# Patient Record
Sex: Male | Born: 1940
Health system: Southern US, Community
[De-identification: ages and names within clinical notes are randomized; demographics above are authoritative.]

## PROBLEM LIST (undated history)

## (undated) DIAGNOSIS — R002 Palpitations: Secondary | ICD-10-CM

## (undated) DIAGNOSIS — C4359 Malignant melanoma of other part of trunk: Secondary | ICD-10-CM

## (undated) DIAGNOSIS — I889 Nonspecific lymphadenitis, unspecified: Secondary | ICD-10-CM

## (undated) DIAGNOSIS — I499 Cardiac arrhythmia, unspecified: Secondary | ICD-10-CM

## (undated) DIAGNOSIS — N400 Enlarged prostate without lower urinary tract symptoms: Secondary | ICD-10-CM

## (undated) DIAGNOSIS — IMO0001 Reserved for inherently not codable concepts without codable children: Secondary | ICD-10-CM

## (undated) DIAGNOSIS — N200 Calculus of kidney: Secondary | ICD-10-CM

## (undated) DIAGNOSIS — K219 Gastro-esophageal reflux disease without esophagitis: Secondary | ICD-10-CM

## (undated) DIAGNOSIS — Z0000 Encounter for general adult medical examination without abnormal findings: Secondary | ICD-10-CM

## (undated) DIAGNOSIS — I4891 Unspecified atrial fibrillation: Secondary | ICD-10-CM

## (undated) DIAGNOSIS — E781 Pure hyperglyceridemia: Secondary | ICD-10-CM

## (undated) DIAGNOSIS — R001 Bradycardia, unspecified: Secondary | ICD-10-CM

## (undated) DIAGNOSIS — I77810 Thoracic aortic ectasia: Secondary | ICD-10-CM

## (undated) DIAGNOSIS — R972 Elevated prostate specific antigen [PSA]: Secondary | ICD-10-CM

## (undated) DIAGNOSIS — Z0389 Encounter for observation for other suspected diseases and conditions ruled out: Secondary | ICD-10-CM

## (undated) DIAGNOSIS — B0229 Other postherpetic nervous system involvement: Secondary | ICD-10-CM

## (undated) DIAGNOSIS — Z87442 Personal history of urinary calculi: Secondary | ICD-10-CM

## (undated) DIAGNOSIS — M1990 Unspecified osteoarthritis, unspecified site: Secondary | ICD-10-CM

## (undated) DIAGNOSIS — H60399 Other infective otitis externa, unspecified ear: Secondary | ICD-10-CM

## (undated) DIAGNOSIS — B029 Zoster without complications: Principal | ICD-10-CM

## (undated) DIAGNOSIS — F419 Anxiety disorder, unspecified: Secondary | ICD-10-CM

## (undated) DIAGNOSIS — G4730 Sleep apnea, unspecified: Secondary | ICD-10-CM

## (undated) DIAGNOSIS — M79645 Pain in left finger(s): Secondary | ICD-10-CM

## (undated) DIAGNOSIS — R0683 Snoring: Secondary | ICD-10-CM

## (undated) HISTORY — DX: Sleep apnea, unspecified: G47.30

## (undated) HISTORY — DX: Palpitations: R00.2

## (undated) HISTORY — PX: MOUTH SURGERY: SHX715

## (undated) HISTORY — DX: Bradycardia, unspecified: R00.1

## (undated) HISTORY — DX: Unspecified atrial fibrillation: I48.91

## (undated) HISTORY — DX: Pure hyperglyceridemia: E78.1

## (undated) HISTORY — DX: Gastro-esophageal reflux disease without esophagitis: K21.9

## (undated) HISTORY — DX: Benign prostatic hyperplasia without lower urinary tract symptoms: N40.0

## (undated) HISTORY — DX: Zoster without complications: B02.9

## (undated) HISTORY — DX: Other infective otitis externa, unspecified ear: H60.399

## (undated) HISTORY — DX: Other postherpetic nervous system involvement: B02.29

## (undated) HISTORY — DX: Snoring: R06.83

## (undated) HISTORY — PX: OTHER SURGICAL HISTORY: SHX169

## (undated) HISTORY — PX: CATARACT EXTRACTION: SUR2

## (undated) HISTORY — DX: Elevated prostate specific antigen (PSA): R97.20

## (undated) HISTORY — DX: Pain in left finger(s): M79.645

## (undated) HISTORY — DX: Malignant melanoma of other part of trunk: C43.59

## (undated) HISTORY — DX: Encounter for general adult medical examination without abnormal findings: Z00.00

## (undated) HISTORY — DX: Encounter for observation for other suspected diseases and conditions ruled out: Z03.89

## (undated) HISTORY — DX: Reserved for inherently not codable concepts without codable children: IMO0001

---
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
## 2002-12-27 DIAGNOSIS — J189 Pneumonia, unspecified organism: Secondary | ICD-10-CM

## 2002-12-27 HISTORY — DX: Pneumonia, unspecified organism: J18.9

## 2003-09-26 LAB — HM COLONOSCOPY: HM Colonoscopy: NORMAL

## 2008-08-29 ENCOUNTER — Ambulatory Visit (HOSPITAL_BASED_OUTPATIENT_CLINIC_OR_DEPARTMENT_OTHER): Admission: RE | Admit: 2008-08-29 | Discharge: 2008-08-29 | Payer: Self-pay | Admitting: *Deleted

## 2008-08-29 ENCOUNTER — Ambulatory Visit: Payer: Self-pay | Admitting: *Deleted

## 2008-08-29 DIAGNOSIS — R030 Elevated blood-pressure reading, without diagnosis of hypertension: Secondary | ICD-10-CM

## 2008-08-29 DIAGNOSIS — M25519 Pain in unspecified shoulder: Secondary | ICD-10-CM | POA: Insufficient documentation

## 2008-08-29 DIAGNOSIS — N400 Enlarged prostate without lower urinary tract symptoms: Secondary | ICD-10-CM

## 2008-08-29 DIAGNOSIS — D1739 Benign lipomatous neoplasm of skin and subcutaneous tissue of other sites: Secondary | ICD-10-CM | POA: Insufficient documentation

## 2008-08-29 DIAGNOSIS — K219 Gastro-esophageal reflux disease without esophagitis: Secondary | ICD-10-CM | POA: Insufficient documentation

## 2008-08-30 ENCOUNTER — Encounter: Admission: RE | Admit: 2008-08-30 | Discharge: 2008-09-18 | Payer: Self-pay | Admitting: *Deleted

## 2008-09-04 ENCOUNTER — Ambulatory Visit (HOSPITAL_BASED_OUTPATIENT_CLINIC_OR_DEPARTMENT_OTHER): Admission: RE | Admit: 2008-09-04 | Discharge: 2008-09-04 | Payer: Self-pay | Admitting: *Deleted

## 2008-09-11 ENCOUNTER — Encounter (INDEPENDENT_AMBULATORY_CARE_PROVIDER_SITE_OTHER): Payer: Self-pay | Admitting: *Deleted

## 2008-09-18 ENCOUNTER — Encounter (INDEPENDENT_AMBULATORY_CARE_PROVIDER_SITE_OTHER): Payer: Self-pay | Admitting: *Deleted

## 2008-10-09 ENCOUNTER — Encounter (INDEPENDENT_AMBULATORY_CARE_PROVIDER_SITE_OTHER): Payer: Self-pay | Admitting: *Deleted

## 2009-01-24 ENCOUNTER — Ambulatory Visit: Payer: Self-pay | Admitting: *Deleted

## 2009-01-24 LAB — CONVERTED CEMR LAB
BUN: 19 mg/dL (ref 6–23)
Chloride: 105 meq/L (ref 96–112)
Creatinine, Ser: 0.9 mg/dL (ref 0.4–1.5)
GFR calc non Af Amer: 90 mL/min

## 2010-01-13 ENCOUNTER — Ambulatory Visit: Payer: Self-pay | Admitting: Internal Medicine

## 2010-01-13 DIAGNOSIS — R972 Elevated prostate specific antigen [PSA]: Secondary | ICD-10-CM | POA: Insufficient documentation

## 2010-01-15 ENCOUNTER — Ambulatory Visit: Payer: Self-pay | Admitting: Internal Medicine

## 2010-01-15 LAB — CONVERTED CEMR LAB
Alkaline Phosphatase: 46 units/L (ref 39–117)
BUN: 15 mg/dL (ref 6–23)
CO2: 25 meq/L (ref 19–32)
CRP, High Sensitivity: 2
Chloride: 104 meq/L (ref 96–112)
Creatinine, Ser: 0.93 mg/dL (ref 0.40–1.50)
Indirect Bilirubin: 0.6 mg/dL (ref 0.0–0.9)
LDL Cholesterol: 71 mg/dL (ref 0–99)
Total Bilirubin: 0.8 mg/dL (ref 0.3–1.2)
Triglycerides: 91 mg/dL (ref <150)

## 2010-01-21 ENCOUNTER — Telehealth: Payer: Self-pay | Admitting: Internal Medicine

## 2010-01-21 ENCOUNTER — Encounter: Payer: Self-pay | Admitting: Internal Medicine

## 2010-01-29 ENCOUNTER — Encounter: Payer: Self-pay | Admitting: Internal Medicine

## 2010-05-28 ENCOUNTER — Encounter: Payer: Self-pay | Admitting: Internal Medicine

## 2010-05-28 LAB — CONVERTED CEMR LAB
AST: 19 units/L (ref 0–37)
Albumin: 4 g/dL (ref 3.5–5.2)
CO2: 23 meq/L (ref 19–32)
Chloride: 108 meq/L (ref 96–112)
HDL: 47 mg/dL (ref >39)
LDL Cholesterol: 74 mg/dL (ref 0–99)
Sodium: 141 meq/L (ref 135–145)
TSH: 1.584 microintl units/mL (ref 0.350–4.500)
Total Bilirubin: 0.7 mg/dL (ref 0.3–1.2)
Total CHOL/HDL Ratio: 2.9

## 2010-06-04 ENCOUNTER — Ambulatory Visit: Payer: Self-pay | Admitting: Internal Medicine

## 2010-06-16 ENCOUNTER — Ambulatory Visit: Payer: Self-pay | Admitting: Internal Medicine

## 2010-06-16 LAB — CONVERTED CEMR LAB: Fecal Occult Bld: NEGATIVE

## 2010-09-23 ENCOUNTER — Encounter: Payer: Self-pay | Admitting: Internal Medicine

## 2010-09-24 ENCOUNTER — Encounter: Payer: Self-pay | Admitting: Internal Medicine

## 2010-09-28 ENCOUNTER — Encounter: Payer: Self-pay | Admitting: Internal Medicine

## 2011-01-26 NOTE — Letter (Signed)
   [place] [address] [place], [place]  [address]  [place] Phone: [phone]      January 21, 2010   [name] [name] [address] [place], [place] [address]  RE:  LAB RESULTS  Dear  [name],  The following is an interpretation of your most recent lab tests.  Please take note of any instructions provided or changes to medications that have resulted from your lab work.  PSA:  high - further testing needed PSA: 4.94  ELECTROLYTES:  Good - no changes needed  KIDNEY FUNCTION TESTS:  Good - no changes needed  LIVER FUNCTION TESTS:  Good - no changes needed  LIPID PANEL:  Good - no changes needed Triglyceride: 91   Cholesterol: 141   LDL: 71   HDL: 52   Chol/HDL%:  2.7 Ratio  THYROID STUDIES:  Thyroid studies normal TSH: 1.566          Sincerely Yours,    Dr. Thomos Lemons

## 2011-01-26 NOTE — Assessment & Plan Note (Signed)
Summary: 6 month follow up/mhf   Vital Signs:  Patient profile:   70 year old male Height:      70.5 inches Weight:      225.75 pounds BMI:     32.05 O2 Sat:      97 % on Room air Temp:     97.6 degrees F oral Pulse rate:   52 / minute Pulse rhythm:   regular Resp:     20 per minute BP sitting:   100 / 70  (left arm) Cuff size:   large  Vitals Entered By: Glendell Docker CMA (June 04, 2010 8:46 AM)  O2 Flow:  Room air CC: Rm 3- 6 Month Follow up  Is Patient Diabetic? No   Primary Care Provider:  DThomos Lemons DO  CC:  Rm 3- 6 Month Follow up .  History of Present Illness: 70 y/o white male for f/u Int hx:  pt seen by Dr. Vernie Ammons (urologist)  we reviewed labs cholesterol levels are reasonable CRP elevated.  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   Mouth:  poor dentition.   Lungs:  normal respiratory effort and normal breath sounds.   Heart:  normal rate, regular rhythm, and no gallop.   Abdomen:  soft, non-tender, and normal bowel sounds.   Extremities:  No lower extremity edema    Impression & Recommendations:  Problem # 1:  ELEVATED PROSTATE  SPECIFIC ANTIGEN (ICD-790.93) previous DRE with urologist is normal.  some subtle elevation of lateral aspect of left lobe noted by his urologist.  monitored by Dr. Vernie Ammons.  Q 3 - 6 month PSA testing.  If upward trending of PSA - prostate biopsy planned.  Problem # 2:  HEALTH MAINTENANCE EXAM (ICD-V70.0)  Pt counseled on diet and exercise.  CRP elevated.  this may be attributable to chronic dental caries.  F/U with dentist / oral surgeon  Colonoscopy: Normal (09/26/2003) Td Booster: Tdap (06/04/2010)   Flu Vax: Historical (09/15/2009)   Pneumovax: Historical (04/09/2003) Chol: 135 (05/28/2010)   HDL: 47 (05/28/2010)   LDL: 74 (05/28/2010)   TG: 71 (05/28/2010) TSH: 1.584 (05/28/2010)   PSA: 4.94 (01/15/2010)  Complete Medication List: 1)  Bayer Childrens Aspirin 81 Mg Chew (Aspirin) .... Take 1 tablet by mouth once a day 2)  Mens Multivitamin Plus Tabs (Multiple vitamins-minerals) .... Take 1 tablet by mouth once a day 3)  Zostavax [address] Unt/0.10ml Solr (Zoster vaccine live) .... Administer vaccine x 1  Other Orders: Tdap => 43yrs IM (60454) Admin 1st Vaccine (09811)  Patient Instructions: 1)  Please schedule a follow-up appointment in 1 year. 2)  Follow up with your dentist 3)  [email or web address]  Current Allergies (reviewed today): No known allergies       Immunizations Administered:  Tetanus Vaccine:    Vaccine Type: Tdap    Site: left deltoid    Mfr: GlaxoSmithKline    Dose: 0.5 ml    Route: IM    Given by: Glendell Docker CMA    Exp. Date: 03/19/2012    Lot #: [id number]    VIS given: 11/14/07 version given June 04, 2010.

## 2011-01-26 NOTE — Letter (Signed)
Summary: [facility]: Immunoassay Fecal Occult Blood (iFOB) Order Psychologist, counselling at [facility]  [address]   [place], [place] [address]   Phone: [phone]  Fax: [phone]      [facility]: Immunoassay Fecal Occult Blood (iFOB) Order Form   June 04, 2010 MRN: [id number]   [name] [name] [birth year]   Physician Name:Dr Thomos Lemons  Diagnosis Code: V70.0      Glendell Docker CMA

## 2011-01-26 NOTE — Letter (Signed)
Summary: [facility]  [facility]   Imported By: Lanelle Bal 10/08/2010 12:26:18  _____________________________________________________________________  External Attachment:    Type:   Image     Comment:   External Document

## 2011-01-26 NOTE — Letter (Signed)
   [place] [facility] [address] [place], [place]  [address]  [place] Phone: [phone]      June 16, 2010   KVION SHAPLEY [address] [place], [place] [address]  RE:  LAB RESULTS  Dear  Mr. Barrett,  The following is an interpretation of your most recent lab tests.  Please take note of any instructions provided or changes to medications that have resulted from your lab work.  Hemoccult Test for blood in stool:  negative         Sincerely Yours,    Dr. Thomos Lemons

## 2011-01-26 NOTE — Miscellaneous (Signed)
Summary: Flu Shot  Clinical Lists Changes  Observations: Added new observation of FLU VAX: Historical (09/23/2010 17:17)        Immunization History:  Influenza Immunization History:    Influenza:  historical (09/23/2010)

## 2011-01-26 NOTE — Miscellaneous (Signed)
Summary: Lab Orders  Clinical Lists Changes  Orders: Added new Test order of T-Basic Metabolic Panel [phone]) - Signed Added new Test order of T-Hepatic Function [phone]) - Signed Added new Test order of T-Lipid Profile [phone]) - Signed Added new Test order of T-TSH ([id number]) - Signed Added new Test order of TLB-CRP-High Sensitivity (C-Reactive Protein) (86140-FCRP) - Signed

## 2011-01-26 NOTE — Consult Note (Signed)
Summary: [facility]  [facility]   Imported By: Lanelle Bal 02/05/2010 12:28:19  _____________________________________________________________________  External Attachment:    Type:   Image     Comment:   External Document

## 2011-01-26 NOTE — Assessment & Plan Note (Signed)
Summary: NEW TO EST/[facility]   Vital Signs:  Patient profile:   70 year old male Height:      70.5 inches Weight:      235 pounds BMI:     33.36 O2 Sat:      98 % on Room air Temp:     98.0 degrees F oral Pulse rate:   54 / minute Pulse rhythm:   regular Resp:     22 per minute BP sitting:   122 / 80  (right arm) Cuff size:   large  Vitals Entered By: Glendell Docker CMA (January 13, 2010 9:45 AM)  O2 Flow:  Room air  Primary Care Provider:  D. Thomos Lemons DO  CC:  New Patient .  History of Present Illness: New Patient to establish and for routine CPX.  70 y/o white male to establish.  He has hx of BPH and elevated PSA.  he reports prev prostate biopsy by Dr. Vernie Ammons.  he has mild obstructive symptoms    Preventive Screening-Counseling & Management  Alcohol-Tobacco     Alcohol drinks/day: 1      Alcohol type: spirits     Packs/Day: 0.5     Year Started: 1963     Year Quit: 1992     Pack years: 2 years      Cigars/week: 1 per day      Pipe use/week: mid 11's -early 90's  Caffeine-Diet-Exercise     Caffeine use/day: 2 cups coffee daly     Does Patient Exercise: yes     Times/week: 3  Allergies (verified): No Known Drug Allergies  Past History:  Past Medical History: GERD - controlled with diet / behavioral changes Benign prostatic hypertrophy White coat HTN    Past Surgical History: Denies surgical history    Family History: Family History of Alcoholism/Addiction    Social History: 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         no depression no memory loss  Physical Exam  General:  alert and overweight-appearing.   Eyes:  pupils equal, pupils round, and pupils  reactive to light.   Mouth:  pharynx pink and moist.   Neck:  supple and no carotid bruits.   Lungs:  normal respiratory effort and normal breath sounds.   Heart:  normal rate, regular rhythm, no murmur, and no gallop.   Abdomen:  soft, non-tender, no masses, no hepatomegaly, and no splenomegaly.   Extremities:  No lower extremity edema  Neurologic:  cranial nerves II-XII intact.   Psych:  normally interactive, good eye contact, not anxious appearing, and not depressed appearing.     Impression & Recommendations:  Problem # 1:  HEALTH MAINTENANCE EXAM (ICD-V70.0) Reviewed adult health maintenance protocols.  Colonoscopy: Normal (09/26/2003) Flu Vax: Historical (09/15/2009)   Pneumovax: Historical (04/09/2003) PSA: 3.96 (01/24/2009)  Problem # 2:  ELEVATED PROSTATE SPECIFIC ANTIGEN (ICD-790.93) Pt previously followed by Dr. Vernie Ammons.  Pt requests repeat PSA.  Follow up with urologist.  Complete Medication List: 1)  Bayer Childrens Aspirin 81 Mg Chew (Aspirin) .... Take 1 tablet by mouth once a day 2)  Mens Multivitamin Plus Tabs (Multiple vitamins-minerals) .... Take 1 tablet by mouth once a day 3)  Zostavax  19400 Unt/0.62ml Solr (Zoster vaccine live) .... Administer vaccine x 1  Patient Instructions: 1)  Please schedule a follow-up appointment in 6 months. 2)  BMP prior to visit, ICD-9:  796.2 3)  Hepatic Panel prior to visit, ICD-9: 796.2 4)  Lipid Panel prior to visit, ICD-9: 796.2 5)  TSH prior to visit, ICD-9: 796.2 6)  High sensitivity CRP :  796.2 7)  PSA prior to visit, ICD-9: 600.00 8)  Please return for lab work within one week 9)  Avoid caffeinated beverages Prescriptions: ZOSTAVAX 91478 UNT/0.65ML SOLR (ZOSTER VACCINE LIVE) administer vaccine x 1  #1 x 0   Entered and Authorized by:   D. Thomos Lemons DO   Signed by:   D. Thomos Lemons DO on 01/13/2010   Method used:   Print then Give to Patient   RxID:   [id number]   Current Allergies (reviewed today): No  known allergies    Immunization History:  Influenza Immunization History:    Influenza:  historical (09/15/2009)  Pneumovax Immunization History:    Pneumovax:  historical (04/09/2003)

## 2011-01-26 NOTE — Progress Notes (Signed)
  Phone Note Outgoing Call   Summary of Call: call pt - PSA elevated 4.94.  I suggest he follow up with urologist Initial call taken by: D. Thomos Lemons DO,  January 21, 2010 6:23 PM  Follow-up for Phone Call        Pt notified as directed  appt  Dr Warner Mccreedy   @ [facility]   Feb 3  Follow-up by: Darral Dash,  January 22, 2010 11:30 AM

## 2011-01-26 NOTE — Miscellaneous (Signed)
Summary: Flu/[facility]  Flu/[facility]   Imported By: Lanelle Bal 10/05/2010 11:49:20  _____________________________________________________________________  External Attachment:    Type:   Image     Comment:   External Document

## 2011-03-23 ENCOUNTER — Emergency Department (HOSPITAL_BASED_OUTPATIENT_CLINIC_OR_DEPARTMENT_OTHER)
Admission: EM | Admit: 2011-03-23 | Discharge: 2011-03-24 | Disposition: A | Discharge status: Home / Self Care | Payer: Medicare Other | Attending: Emergency Medicine | Admitting: Emergency Medicine

## 2011-03-23 ENCOUNTER — Emergency Department (INDEPENDENT_AMBULATORY_CARE_PROVIDER_SITE_OTHER): Payer: Medicare Other

## 2011-03-23 DIAGNOSIS — J40 Bronchitis, not specified as acute or chronic: Secondary | ICD-10-CM | POA: Insufficient documentation

## 2011-03-23 DIAGNOSIS — R0602 Shortness of breath: Secondary | ICD-10-CM | POA: Insufficient documentation

## 2011-03-23 DIAGNOSIS — R0989 Other specified symptoms and signs involving the circulatory and respiratory systems: Secondary | ICD-10-CM

## 2011-03-23 DIAGNOSIS — L908 Other atrophic disorders of skin: Secondary | ICD-10-CM | POA: Insufficient documentation

## 2011-03-23 DIAGNOSIS — R05 Cough: Secondary | ICD-10-CM

## 2011-06-03 ENCOUNTER — Ambulatory Visit: Payer: Self-pay | Admitting: Internal Medicine

## 2011-06-18 ENCOUNTER — Encounter: Payer: PRIVATE HEALTH INSURANCE | Admitting: Internal Medicine

## 2011-06-22 ENCOUNTER — Encounter: Payer: PRIVATE HEALTH INSURANCE | Admitting: Internal Medicine

## 2011-07-15 ENCOUNTER — Encounter: Payer: Self-pay | Admitting: Internal Medicine

## 2011-07-20 ENCOUNTER — Encounter: Payer: PRIVATE HEALTH INSURANCE | Admitting: Internal Medicine

## 2011-07-20 ENCOUNTER — Other Ambulatory Visit: Payer: Self-pay | Admitting: Internal Medicine

## 2011-07-20 DIAGNOSIS — R030 Elevated blood-pressure reading, without diagnosis of hypertension: Secondary | ICD-10-CM

## 2011-07-21 LAB — BASIC METABOLIC PANEL
BUN: 22 mg/dL (ref 6–23)
CO2: 24 mEq/L (ref 19–32)
Calcium: 8.8 mg/dL (ref 8.4–10.5)
Creat: 0.94 mg/dL (ref 0.50–1.35)

## 2011-07-21 LAB — HEPATIC FUNCTION PANEL
Alkaline Phosphatase: 42 U/L (ref 39–117)
Bilirubin, Direct: 0.1 mg/dL (ref 0.0–0.3)
Indirect Bilirubin: 0.6 mg/dL (ref 0.0–0.9)
Total Protein: 6.8 g/dL (ref 6.0–8.3)

## 2011-07-21 LAB — LIPID PANEL: HDL: 46 mg/dL (ref >39)

## 2011-07-21 LAB — TSH: TSH: 2.104 u[IU]/mL (ref 0.350–4.500)

## 2011-07-30 ENCOUNTER — Encounter: Payer: Self-pay | Admitting: Internal Medicine

## 2011-07-30 ENCOUNTER — Ambulatory Visit (INDEPENDENT_AMBULATORY_CARE_PROVIDER_SITE_OTHER): Payer: Medicare Other | Admitting: Internal Medicine

## 2011-07-30 VITALS — BP 126/80 | HR 51 | Temp 97.5°F | Resp 20 | Ht 70.5 in | Wt 230.0 lb

## 2011-07-30 DIAGNOSIS — R0600 Dyspnea, unspecified: Secondary | ICD-10-CM

## 2011-07-30 DIAGNOSIS — R0683 Snoring: Secondary | ICD-10-CM

## 2011-07-30 DIAGNOSIS — G4730 Sleep apnea, unspecified: Secondary | ICD-10-CM

## 2011-07-30 DIAGNOSIS — Z9989 Dependence on other enabling machines and devices: Secondary | ICD-10-CM | POA: Insufficient documentation

## 2011-07-30 DIAGNOSIS — R0989 Other specified symptoms and signs involving the circulatory and respiratory systems: Secondary | ICD-10-CM

## 2011-07-30 HISTORY — DX: Snoring: R06.83

## 2011-07-30 HISTORY — DX: Sleep apnea, unspecified: G47.30

## 2011-07-30 MED ORDER — ALBUTEROL 90 MCG/ACT IN AERS: 2.0000 | INHALATION_SPRAY | Freq: Four times a day (QID) | RESPIRATORY_TRACT | Status: DC | PRN

## 2011-07-30 NOTE — Assessment & Plan Note (Signed)
EKG obtained demonstrates SB 53 with nl intervals and axis. Suspect RAD. Provide with albuterol mdi prn.

## 2011-07-30 NOTE — Progress Notes (Signed)
  Subjective:    Patient ID: [name] [name], male    DOB: 1941/10/21, 70 y.o.   MRN: [id number]  HPI Pt presents to clinic for followup of multiple medical problems. Notes intermittent rare wheezing and mild dyspnea. Was told in past might have been allergic etiology. Previously responded to albuterol but has no mdi currently. H/o elevated psa and possible bph followed currently by urology. Reports stable psa. Past h/o elevated bp and bp reviewed nl today. No other complaints.   Reviewed pmh, medications and allergies.    Review of Systems  Respiratory: Negative for cough, shortness of breath and wheezing.   Cardiovascular: Negative for chest pain and palpitations.  Gastrointestinal: Negative for abdominal pain and blood in stool.       Objective:   Physical Exam  Nursing note and vitals reviewed. Constitutional: He appears well-developed and well-nourished. No distress.  HENT:  Head: Normocephalic and atraumatic.  Right Ear: Tympanic membrane, external ear and ear canal normal.  Left Ear: Tympanic membrane, external ear and ear canal normal.  Nose: Nose normal.  Mouth/Throat: Oropharynx is clear and moist. No oropharyngeal exudate.  Eyes: Conjunctivae and EOM are normal. Pupils are equal, round, and reactive to light. Right eye exhibits no discharge. Left eye exhibits no discharge. No scleral icterus.  Neck: Normal range of motion. Neck supple. No JVD present. Carotid bruit is not present. No thyromegaly present.  Cardiovascular: Normal rate, regular rhythm, normal heart sounds and intact distal pulses.  Exam reveals no gallop and no friction rub.   No murmur heard. Pulmonary/Chest: Effort normal and breath sounds normal. No respiratory distress. He has no wheezes. He has no rales.  Abdominal: Soft. Bowel sounds are normal. He exhibits no distension and no mass. There is no hepatosplenomegaly. There is no tenderness. There is no rebound and no guarding.  Lymphadenopathy:    He has no  cervical adenopathy.  Neurological: He is alert.  Skin: Skin is warm and dry. He is not diaphoretic.  Psychiatric: He has a normal mood and affect.          Assessment & Plan:

## 2012-03-27 DIAGNOSIS — R972 Elevated prostate specific antigen [PSA]: Secondary | ICD-10-CM | POA: Diagnosis not present

## 2012-03-28 DIAGNOSIS — N401 Enlarged prostate with lower urinary tract symptoms: Secondary | ICD-10-CM | POA: Diagnosis not present

## 2012-03-28 DIAGNOSIS — R972 Elevated prostate specific antigen [PSA]: Secondary | ICD-10-CM | POA: Diagnosis not present

## 2012-06-05 ENCOUNTER — Ambulatory Visit (INDEPENDENT_AMBULATORY_CARE_PROVIDER_SITE_OTHER): Payer: Medicare Other | Admitting: Internal Medicine

## 2012-06-05 ENCOUNTER — Ambulatory Visit (HOSPITAL_BASED_OUTPATIENT_CLINIC_OR_DEPARTMENT_OTHER)
Admission: RE | Admit: 2012-06-05 | Discharge: 2012-06-05 | Disposition: A | Discharge status: Home / Self Care | Payer: Medicare Other | Source: Ambulatory Visit | Attending: Internal Medicine | Admitting: Internal Medicine

## 2012-06-05 ENCOUNTER — Encounter: Payer: Self-pay | Admitting: Internal Medicine

## 2012-06-05 VITALS — BP 108/68 | HR 68 | Temp 97.7°F | Resp 20 | Ht 70.5 in | Wt 220.0 lb

## 2012-06-05 DIAGNOSIS — R0789 Other chest pain: Secondary | ICD-10-CM

## 2012-06-05 DIAGNOSIS — R071 Chest pain on breathing: Secondary | ICD-10-CM

## 2012-06-05 DIAGNOSIS — R079 Chest pain, unspecified: Secondary | ICD-10-CM | POA: Insufficient documentation

## 2012-06-05 MED ORDER — DICLOFENAC SODIUM 75 MG PO TBEC: DELAYED_RELEASE_TABLET | ORAL | Status: DC

## 2012-06-11 DIAGNOSIS — R0789 Other chest pain: Secondary | ICD-10-CM | POA: Insufficient documentation

## 2012-06-11 NOTE — Progress Notes (Signed)
  Subjective:    Patient ID: [name] [name], male    DOB: 1941-02-10, 71 y.o.   MRN: [id number]  HPI Pt presents to clinic for evaluation of shoulder pain. Notes one week h/o right shoulder pain worse with movement. No trigger/injury. Location right anterior CW and scapular. No alleviating or exacerbating factors.   Past Medical History  Diagnosis Date  . GERD (gastroesophageal reflux disease)     controlled w/ diet and behavioral changes  . BPH (benign prostatic hypertrophy)   . Elevated PSA   . White coat hypertension    No past surgical history on file.  reports that he has quit smoking. He has never used smokeless tobacco. He reports that he drinks alcohol. He reports that he does not use illicit drugs. family history includes Alcohol abuse in his other; Cancer in his sister; Emphysema in his father; and Liver disease in his mother. No Known Allergies   Review of Systems see hpi     Objective:   Physical Exam  Nursing note and vitals reviewed. Constitutional: He appears well-developed and well-nourished. No distress.  Musculoskeletal:       FROM right shoulder. No crepitus. NT. No bony abn. Ant CW +tenderness without bony abn  Neurological: He is alert.  Skin: Skin is warm and dry. He is not diaphoretic.  Psychiatric: He has a normal mood and affect.          Assessment & Plan:

## 2012-06-11 NOTE — Assessment & Plan Note (Signed)
Obtain cxr. attept voltaren with food and no other nsaids. Followup if no improvement or worsening.

## 2012-07-27 ENCOUNTER — Encounter: Payer: Medicare Other | Admitting: Internal Medicine

## 2012-08-03 ENCOUNTER — Ambulatory Visit (INDEPENDENT_AMBULATORY_CARE_PROVIDER_SITE_OTHER): Payer: Medicare Other | Admitting: Internal Medicine

## 2012-08-03 ENCOUNTER — Encounter: Payer: Self-pay | Admitting: Internal Medicine

## 2012-08-03 VITALS — BP 118/82 | HR 49 | Temp 98.0°F | Resp 16 | Ht 70.0 in | Wt 216.2 lb

## 2012-08-03 DIAGNOSIS — M25519 Pain in unspecified shoulder: Secondary | ICD-10-CM

## 2012-08-03 DIAGNOSIS — H01002 Unspecified blepharitis right lower eyelid: Secondary | ICD-10-CM

## 2012-08-03 DIAGNOSIS — H01009 Unspecified blepharitis unspecified eye, unspecified eyelid: Secondary | ICD-10-CM | POA: Diagnosis not present

## 2012-08-03 DIAGNOSIS — Z79899 Other long term (current) drug therapy: Secondary | ICD-10-CM

## 2012-08-03 LAB — CBC WITH DIFFERENTIAL/PLATELET
Basophils Relative: 1 % (ref 0–1)
Eosinophils Absolute: 0.2 10*3/uL (ref 0.0–0.7)
Eosinophils Relative: 2 % (ref 0–5)
HCT: 41.5 % (ref 39.0–52.0)
Hemoglobin: 14.1 g/dL (ref 13.0–17.0)
MCH: 29.5 pg (ref 26.0–34.0)
MCHC: 34 g/dL (ref 30.0–36.0)
MCV: 86.8 fL (ref 78.0–100.0)
Monocytes Absolute: 0.6 10*3/uL (ref 0.1–1.0)
Monocytes Relative: 8 % (ref 3–12)
Neutrophils Relative %: 62 % (ref 43–77)

## 2012-08-03 LAB — BASIC METABOLIC PANEL
BUN: 18 mg/dL (ref 6–23)
CO2: 27 mEq/L (ref 19–32)
Calcium: 8.9 mg/dL (ref 8.4–10.5)
Glucose, Bld: 82 mg/dL (ref 70–99)

## 2012-08-03 LAB — HEPATIC FUNCTION PANEL
Albumin: 3.9 g/dL (ref 3.5–5.2)
Total Bilirubin: 0.6 mg/dL (ref 0.3–1.2)
Total Protein: 6.7 g/dL (ref 6.0–8.3)

## 2012-08-03 MED ORDER — BACITRACIN-POLYMYXIN B 500-10000 UNIT/GM OP OINT: TOPICAL_OINTMENT | Freq: Two times a day (BID) | OPHTHALMIC | Status: AC

## 2012-08-03 NOTE — Progress Notes (Signed)
  Subjective:    Patient ID: [name] [name], male    DOB: 02-09-1941, 71 y.o.   MRN: [id number]  HPI Pt presents to clinic for followup of multiple medical problems. Notes three day h/o right lower eyelid swelling without injury or change in vision. Notes improvement. Suffers from chronic left shoulder pain with decreased abduction. No improvement with nsaids.   Past Medical History  Diagnosis Date  . GERD (gastroesophageal reflux disease)     controlled w/ diet and behavioral changes  . BPH (benign prostatic hypertrophy)   . Elevated PSA   . White coat hypertension    No past surgical history on file.  reports that he has quit smoking. He has never used smokeless tobacco. He reports that he drinks alcohol. He reports that he does not use illicit drugs. family history includes Alcohol abuse in his other; Cancer in his sister; Emphysema in his father; and Liver disease in his mother. No Known Allergies    Review of Systems see hpi     Objective:   Physical Exam  Physical Exam  Nursing note and vitals reviewed. Constitutional: Appears well-developed and well-nourished. No distress.  HENT: right lower eyelid mildly swollen. NT. No drainage. EOM intact. Head: Normocephalic and atraumatic.  Right Ear: External ear normal.  Left Ear: External ear normal.  Eyes: Conjunctivae are normal. No scleral icterus.  Neck: Neck supple. Carotid bruit is not present.  Cardiovascular: Normal rate, regular rhythm and normal heart sounds.  Exam reveals no gallop and no friction rub.   No murmur heard. Pulmonary/Chest: Effort normal and breath sounds normal. No respiratory distress. He has no wheezes. no rales.  Lymphadenopathy:    He has no cervical adenopathy.  Neurological:Alert.  Skin: Skin is warm and dry. Not diaphoretic.  Psychiatric: Has a normal mood and affect.        Assessment & Plan:

## 2012-08-03 NOTE — Assessment & Plan Note (Signed)
Attempt abx opthalmic ointment. Followup if no improvement or worsening.

## 2012-08-03 NOTE — Assessment & Plan Note (Signed)
Orthopedic referral.

## 2012-08-04 DIAGNOSIS — M19019 Primary osteoarthritis, unspecified shoulder: Secondary | ICD-10-CM | POA: Diagnosis not present

## 2012-08-07 DIAGNOSIS — M19019 Primary osteoarthritis, unspecified shoulder: Secondary | ICD-10-CM | POA: Diagnosis not present

## 2012-10-27 DIAGNOSIS — Z23 Encounter for immunization: Secondary | ICD-10-CM | POA: Diagnosis not present

## 2012-10-31 DIAGNOSIS — L821 Other seborrheic keratosis: Secondary | ICD-10-CM | POA: Diagnosis not present

## 2012-10-31 DIAGNOSIS — L719 Rosacea, unspecified: Secondary | ICD-10-CM | POA: Diagnosis not present

## 2012-10-31 DIAGNOSIS — C4359 Malignant melanoma of other part of trunk: Secondary | ICD-10-CM | POA: Diagnosis not present

## 2012-11-09 DIAGNOSIS — D485 Neoplasm of uncertain behavior of skin: Secondary | ICD-10-CM | POA: Diagnosis not present

## 2012-11-09 DIAGNOSIS — C4359 Malignant melanoma of other part of trunk: Secondary | ICD-10-CM | POA: Diagnosis not present

## 2013-03-05 DIAGNOSIS — L909 Atrophic disorder of skin, unspecified: Secondary | ICD-10-CM | POA: Diagnosis not present

## 2013-03-05 DIAGNOSIS — L821 Other seborrheic keratosis: Secondary | ICD-10-CM | POA: Diagnosis not present

## 2013-03-05 DIAGNOSIS — L570 Actinic keratosis: Secondary | ICD-10-CM | POA: Diagnosis not present

## 2013-03-05 DIAGNOSIS — D239 Other benign neoplasm of skin, unspecified: Secondary | ICD-10-CM | POA: Diagnosis not present

## 2013-03-05 DIAGNOSIS — Z85820 Personal history of malignant melanoma of skin: Secondary | ICD-10-CM | POA: Diagnosis not present

## 2013-04-05 DIAGNOSIS — N138 Other obstructive and reflux uropathy: Secondary | ICD-10-CM | POA: Diagnosis not present

## 2013-04-05 DIAGNOSIS — R972 Elevated prostate specific antigen [PSA]: Secondary | ICD-10-CM | POA: Diagnosis not present

## 2013-04-05 DIAGNOSIS — N401 Enlarged prostate with lower urinary tract symptoms: Secondary | ICD-10-CM | POA: Diagnosis not present

## 2013-04-17 DIAGNOSIS — R972 Elevated prostate specific antigen [PSA]: Secondary | ICD-10-CM | POA: Diagnosis not present

## 2013-04-17 DIAGNOSIS — N401 Enlarged prostate with lower urinary tract symptoms: Secondary | ICD-10-CM | POA: Diagnosis not present

## 2013-06-04 ENCOUNTER — Encounter: Payer: Self-pay | Admitting: Cardiovascular Disease

## 2013-07-05 DIAGNOSIS — L820 Inflamed seborrheic keratosis: Secondary | ICD-10-CM | POA: Diagnosis not present

## 2013-07-05 DIAGNOSIS — Z85820 Personal history of malignant melanoma of skin: Secondary | ICD-10-CM | POA: Diagnosis not present

## 2013-07-05 DIAGNOSIS — D485 Neoplasm of uncertain behavior of skin: Secondary | ICD-10-CM | POA: Diagnosis not present

## 2013-07-05 DIAGNOSIS — D1801 Hemangioma of skin and subcutaneous tissue: Secondary | ICD-10-CM | POA: Diagnosis not present

## 2013-07-05 DIAGNOSIS — D2370 Other benign neoplasm of skin of unspecified lower limb, including hip: Secondary | ICD-10-CM | POA: Diagnosis not present

## 2013-07-05 DIAGNOSIS — D1739 Benign lipomatous neoplasm of skin and subcutaneous tissue of other sites: Secondary | ICD-10-CM | POA: Diagnosis not present

## 2013-07-09 ENCOUNTER — Encounter: Payer: Self-pay | Admitting: Family Medicine

## 2013-07-09 ENCOUNTER — Ambulatory Visit (INDEPENDENT_AMBULATORY_CARE_PROVIDER_SITE_OTHER): Payer: Medicare Other | Admitting: Family Medicine

## 2013-07-09 VITALS — BP 132/92 | HR 68 | Temp 98.6°F | Ht 70.5 in | Wt 220.0 lb

## 2013-07-09 DIAGNOSIS — B0229 Other postherpetic nervous system involvement: Secondary | ICD-10-CM | POA: Insufficient documentation

## 2013-07-09 DIAGNOSIS — B029 Zoster without complications: Secondary | ICD-10-CM

## 2013-07-09 DIAGNOSIS — R030 Elevated blood-pressure reading, without diagnosis of hypertension: Secondary | ICD-10-CM | POA: Diagnosis not present

## 2013-07-09 HISTORY — DX: Zoster without complications: B02.9

## 2013-07-09 MED ORDER — ACYCLOVIR 400 MG PO TABS: 400.0000 mg | ORAL_TABLET | ORAL | Status: DC

## 2013-07-09 MED ORDER — HYDROXYZINE HCL 25 MG PO TABS: 25.0000 mg | ORAL_TABLET | Freq: Two times a day (BID) | ORAL | Status: DC | PRN

## 2013-07-09 NOTE — Assessment & Plan Note (Signed)
Patient reports having Zostavax shot a couple of years ago. Given rx  For Acyclovir and encouraged to cleanse area frequently with Jeanann Lewandowsky Astringent then may use Sarna anti itch lotion, given rx for Hydroxyzine prn for itching, has Vicodin at home he can use prn for pain

## 2013-07-09 NOTE — Patient Instructions (Addendum)
Try Witch Hazel Astringent and/or Sarna anti itch lotion   Shingles Shingles (herpes zoster) is an infection that is caused by the same virus that causes chickenpox (varicella). The infection causes a painful skin rash and fluid-filled blisters, which eventually break open, crust over, and heal. It may occur in any area of the body, but it usually affects only one side of the body or face. The pain of shingles usually lasts about 1 month. However, some people with shingles may develop long-term (chronic) pain in the affected area of the body. Shingles often occurs many years after the person had chickenpox. It is more common:  In people older than 50 years.  In people with weakened immune systems, such as those with HIV, AIDS, or cancer.  In people taking medicines that weaken the immune system, such as transplant medicines.  In people under great stress. CAUSES  Shingles is caused by the varicella zoster virus (VZV), which also causes chickenpox. After a person is infected with the virus, it can remain in the person's body for years in an inactive state (dormant). To cause shingles, the virus reactivates and breaks out as an infection in a nerve root. The virus can be spread from person to person (contagious) through contact with open blisters of the shingles rash. It will only spread to people who have not had chickenpox. When these people are exposed to the virus, they may develop chickenpox. They will not develop shingles. Once the blisters scab over, the person is no longer contagious and cannot spread the virus to others. SYMPTOMS  Shingles shows up in stages. The initial symptoms may be pain, itching, and tingling in an area of the skin. This pain is usually described as burning, stabbing, or throbbing.In a few days or weeks, a painful red rash will appear in the area where the pain, itching, and tingling were felt. The rash is usually on one side of the body in a band or belt-like pattern.  Then, the rash usually turns into fluid-filled blisters. They will scab over and dry up in approximately 2 3 weeks. Flu-like symptoms may also occur with the initial symptoms, the rash, or the blisters. These may include:  Fever.  Chills.  Headache.  Upset stomach. DIAGNOSIS  Your caregiver will perform a skin exam to diagnose shingles. Skin scrapings or fluid samples may also be taken from the blisters. This sample will be examined under a microscope or sent to a lab for further testing. TREATMENT  There is no specific cure for shingles. Your caregiver will likely prescribe medicines to help you manage the pain, recover faster, and avoid long-term problems. This may include antiviral drugs, anti-inflammatory drugs, and pain medicines. HOME CARE INSTRUCTIONS   Take a cool bath or apply cool compresses to the area of the rash or blisters as directed. This may help with the pain and itching.   Only take over-the-counter or prescription medicines as directed by your caregiver.   Rest as directed by your caregiver.  Keep your rash and blisters clean with mild soap and cool water or as directed by your caregiver.  Do not pick your blisters or scratch your rash. Apply an anti-itch cream or numbing creams to the affected area as directed by your caregiver.  Keep your shingles rash covered with a loose bandage (dressing).  Avoid skin contact with:  Babies.   Pregnant women.   Children with eczema.   Elderly people with transplants.   People with chronic illnesses, such as  leukemia or AIDS.   Wear loose-fitting clothing to help ease the pain of material rubbing against the rash.  Keep all follow-up appointments with your caregiver.If the area involved is on your face, you may receive a referral for follow-up to a specialist, such as an eye doctor (ophthalmologist) or an ear, nose, and throat (ENT) doctor. Keeping all follow-up appointments will help you avoid eye  complications, chronic pain, or disability.  SEEK IMMEDIATE MEDICAL CARE IF:   You have facial pain, pain around the eye area, or loss of feeling on one side of your face.  You have ear pain or ringing in your ear.  You have loss of taste.  Your pain is not relieved with prescribed medicines.   Your redness or swelling spreads.   You have more pain and swelling.  Your condition is worsening or has changed.   You have a feveror persistent symptoms for more than 2 3 days.  You have a fever and your symptoms suddenly get worse. MAKE SURE YOU:  Understand these instructions.  Will watch your condition.  Will get help right away if you are not doing well or get worse. Document Released: 12/13/2005 Document Revised: 09/06/2012 Document Reviewed: 07/27/2012 [facility] Patient Information 2014 [place], [place].

## 2013-07-09 NOTE — Progress Notes (Signed)
Patient ID: [name] [name], male   DOB: 1941/03/29, 72 y.o.   MRN: [id number] Enrico Eaddy [id number] Jun 14, 1941 07/09/2013      Progress Note-Follow Up  Subjective  Chief Complaint  Chief Complaint  Patient presents with  . Herpes Zoster    X yesterday- pain on Sat. chest to back- right side    HPI  Patient is a 72 year old Caucasian male who is in today accompanied by his wife with a painful pruritic rash. The rash started yesterday in his right axilla. He had had an achy discomfort in that area the day before. The rash has spread from his anterior chest starting at the midline on the right around to his back stopping at the spine. It is more itchy than painful does ache. No fevers or chills. No signs of other recent. No headache, GI or GU concerns  Past Medical History  Diagnosis Date  . GERD (gastroesophageal reflux disease)     controlled w/ diet and behavioral changes  . BPH (benign prostatic hypertrophy)   . Elevated PSA   . White coat hypertension   . Shingles 07/09/2013    History reviewed. No pertinent past surgical history.  Family History  Problem Relation Age of Onset  . Liver disease Mother   . Emphysema Father   . Cancer Sister     brain  . Alcohol abuse Other     History   Social History  . Marital Status: Married    Spouse Name: [name]    Number of Children: 2  . Years of Education: N/A   Occupational History  . retired     Naval architect   Social History Main Topics  . Smoking status: Former Games developer  . Smokeless tobacco: Never Used  . Alcohol Use: Yes     Comment: 1 per day  . Drug Use: No  . Sexually Active: Not on file   Other Topics Concern  . Not on file   Social History Narrative  . No narrative on file    Current Outpatient Prescriptions on File Prior to Visit  Medication Sig Dispense Refill  . albuterol (PROVENTIL,VENTOLIN) 90 MCG/ACT inhaler Inhale 2 puffs into the lungs every 6 (six) hours as needed for wheezing.  17 g  6  .  aspirin 81 MG chewable tablet Chew 81 mg by mouth daily.        . Multiple Vitamins-Minerals (MENS MULTIVITAMIN PLUS PO) Take 1 tablet by mouth daily.        . Omega-3 Fatty Acids (FISH OIL PO) Take 1,400 mg by mouth daily.       No current facility-administered medications on file prior to visit.    No Known Allergies  Review of Systems  Review of Systems  Constitutional: Negative for fever and malaise/fatigue.  HENT: Negative for congestion.   Eyes: Negative for pain and discharge.  Respiratory: Negative for shortness of breath.   Cardiovascular: Negative for chest pain, palpitations and leg swelling.  Gastrointestinal: Negative for nausea, abdominal pain and diarrhea.  Genitourinary: Negative for dysuria.  Musculoskeletal: Negative for falls.  Skin: Positive for itching and rash.  Neurological: Negative for loss of consciousness and headaches.  Endo/Heme/Allergies: Negative for polydipsia.  Psychiatric/Behavioral: Negative for depression and suicidal ideas. The patient is not nervous/anxious and does not have insomnia.     Objective  BP 132/92  Pulse 68  Temp(Src) 98.6 F (37 C) (Oral)  Ht 5' 10.5" (1.791 m)  Wt 220 lb 0.6 oz (99.809 kg)  BMI 31.12 kg/m2  SpO2 96%  Physical Exam  Physical Exam  Constitutional: He is oriented to person, place, and time and well-developed, well-nourished, and in no distress. No distress.  HENT:  Head: Normocephalic and atraumatic.  Eyes: Conjunctivae are normal.  Neck: Neck supple. No thyromegaly present.  Cardiovascular: Normal rate and regular rhythm.  Exam reveals no gallop.   No murmur heard. Pulmonary/Chest: Effort normal and breath sounds normal. No respiratory distress.  Abdominal: He exhibits no distension and no mass. There is no tenderness.  Musculoskeletal: He exhibits no edema.  Neurological: He is alert and oriented to person, place, and time.  Skin: Skin is warm. Rash noted. There is erythema.  Linear rash, from  right anterior chest wall to spine in back, erythematous and raised with small vesicles on surface  Psychiatric: Memory, affect and judgment normal.    Lab Results  Component Value Date   TSH 2.104 07/20/2011   Lab Results  Component Value Date   WBC 7.3 08/03/2012   HGB 14.1 08/03/2012   HCT 41.5 08/03/2012   MCV 86.8 08/03/2012   PLT 254 08/03/2012   Lab Results  Component Value Date   CREATININE 0.90 08/03/2012   BUN 18 08/03/2012   NA 141 08/03/2012   K 4.4 08/03/2012   CL 106 08/03/2012   CO2 27 08/03/2012   Lab Results  Component Value Date   ALT 19 08/03/2012   AST 18 08/03/2012   ALKPHOS 52 08/03/2012   BILITOT 0.6 08/03/2012   Lab Results  Component Value Date   CHOL 157 07/20/2011   Lab Results  Component Value Date   HDL 46 07/20/2011   Lab Results  Component Value Date   LDLCALC 95 07/20/2011   Lab Results  Component Value Date   TRIG 82 07/20/2011   Lab Results  Component Value Date   CHOLHDL 3.4 07/20/2011     Assessment & Plan  ELEVATED BP READING WITHOUT DX HYPERTENSION Adequate control despite discomfort, no changes  Shingles Patient reports having Zostavax shot a couple of years ago. Given rx  For Acyclovir and encouraged to cleanse area frequently with Jeanann Lewandowsky Astringent then may use Sarna anti itch lotion, given rx for Hydroxyzine prn for itching, has Vicodin at home he can use prn for pain

## 2013-07-09 NOTE — Assessment & Plan Note (Signed)
Adequate control despite discomfort, no changes

## 2013-07-24 ENCOUNTER — Ambulatory Visit (INDEPENDENT_AMBULATORY_CARE_PROVIDER_SITE_OTHER): Payer: Medicare Other | Admitting: Family

## 2013-07-24 ENCOUNTER — Encounter: Payer: Self-pay | Admitting: Family

## 2013-07-24 VITALS — BP 130/96 | HR 94 | Temp 98.6°F | Resp 18 | Wt 218.0 lb

## 2013-07-24 DIAGNOSIS — B029 Zoster without complications: Secondary | ICD-10-CM | POA: Diagnosis not present

## 2013-07-24 MED ORDER — LIDOCAINE 5 % EX PTCH: 1.0000 | MEDICATED_PATCH | CUTANEOUS | Status: DC

## 2013-07-24 MED ORDER — GABAPENTIN 300 MG PO CAPS: ORAL_CAPSULE | ORAL | Status: DC

## 2013-07-24 NOTE — Patient Instructions (Addendum)
Postherpetic Neuralgia Shingles is a painful disease. It is caused by the herpes zoster virus. This is the same virus which also causes chickenpox. It can affect the torso, limbs, or the face. For most people, shingles is a condition of rather sudden onset. Pain usually lasts about 1 month. In older patients, or patients with poor immune systems, a painful, long-standing (chronic) condition called postherpetic neuralgia can develop. This condition rarely happens before age 40. But at least 50% of people over 50 become affected following an attack of shingles. There is a natural tendency for this condition to improve over time with no treatment. Less than 5% of patients have pain that lasts for more than 1 year.

## 2013-07-24 NOTE — Progress Notes (Signed)
  Subjective:    Patient ID: [name] [name], male    DOB: Apr 12, 1941, 71 y.o.   MRN: [id number]  HPI  [name] [name] is a 72 yr old male diagnosed with herpes zoster on 7/14 and treated with acyclovir completed Monday or Tuesday of last week who presents today with chief complaint of pain.  Pain is intermittent. Pain is located in the area which was affected the herpes zoster. Pain is worst overlying the right scapula but wraps around to the front area of his chest.  Pain is described as burning.  Pain is worse with movement. Pain is describes as 10/10.   Review of Systems See HPI  Past Medical History  Diagnosis Date  . GERD (gastroesophageal reflux disease)     controlled w/ diet and behavioral changes  . BPH (benign prostatic hypertrophy)   . Elevated PSA   . White coat hypertension   . Shingles 07/09/2013    History   Social History  . Marital Status: Married    Spouse Name: [name]    Number of Children: 2  . Years of Education: N/A   Occupational History  . retired     Naval architect   Social History Main Topics  . Smoking status: Former Games developer  . Smokeless tobacco: Never Used  . Alcohol Use: Yes     Comment: 1 per day  . Drug Use: No  . Sexually Active: Not on file   Other Topics Concern  . Not on file   Social History Narrative  . No narrative on file    No past surgical history on file.  Family History  Problem Relation Age of Onset  . Liver disease Mother   . Emphysema Father   . Cancer Sister     brain  . Alcohol abuse Other     No Known Allergies  Current Outpatient Prescriptions on File Prior to Visit  Medication Sig Dispense Refill  . aspirin 81 MG chewable tablet Chew 81 mg by mouth daily.        . hydrOXYzine (ATARAX/VISTARIL) 25 MG tablet Take 1 tablet (25 mg total) by mouth 2 (two) times daily as needed for itching.  60 tablet  1  . Multiple Vitamins-Minerals (MENS MULTIVITAMIN PLUS PO) Take 1 tablet by mouth daily.        . Omega-3 Fatty  Acids (FISH OIL PO) Take 1,400 mg by mouth daily.       No current facility-administered medications on file prior to visit.    BP 130/96  Pulse 94  Temp(Src) 98.6 F (37 C) (Oral)  Resp 18  Wt 218 lb (98.884 kg)  BMI 30.83 kg/m2  SpO2 98%       Objective:   Physical Exam  Constitutional: He appears well-developed and well-nourished. No distress.  HENT:  Head: Normocephalic and atraumatic.  Cardiovascular: Normal rate and regular rhythm.   No murmur heard. Pulmonary/Chest: Effort normal and breath sounds normal. No respiratory distress. He has no wheezes. He has no rales. He exhibits no tenderness.  Lymphadenopathy:  Mild right axillary LAD noted  Skin:     Raised erythematous rash overlying right scapula and wrapping around to right anterior chest.          Assessment & Plan:

## 2013-07-25 NOTE — Assessment & Plan Note (Signed)
New.  Will give trial of lidocaine patch + gabapentin.  Recommended capsaicin cream prn.   He has follow up next month scheduled with Dr. Abner Greenspan.

## 2013-07-31 ENCOUNTER — Telehealth: Payer: Self-pay

## 2013-07-31 MED ORDER — HYDROCODONE-ACETAMINOPHEN 7.5-325 MG PO TABS: 1.0000 | ORAL_TABLET | Freq: Three times a day (TID) | ORAL | Status: DC | PRN

## 2013-07-31 NOTE — Telephone Encounter (Signed)
Pt informed and RX sent

## 2013-07-31 NOTE — Telephone Encounter (Signed)
So nothing else helps the nerve pain as well long term, so if he is up to tid dosing then he can titrate up the Gabapentin 300 mg caps to 2 caps tid as tolerated. Start by adding an extra dose at bedtime. Fatigue is the side effect that stops [name] from going up. He can also have some Norco 7.5/325 1 tab po tid prn pain, disp #60 but warn him not to drive on it and that it can cause sedation, confusion, constipation and habituation so to take it sparingly

## 2013-07-31 NOTE — Telephone Encounter (Signed)
Patient left a message stating that he was in the office last week with shingles and was given Gabapentin. Pts vm stated that the Gabapentin is not helping at all and would like something else called into pharmacy?  Please advise?

## 2013-08-06 ENCOUNTER — Ambulatory Visit: Payer: Medicare Other | Admitting: Family Medicine

## 2013-08-06 ENCOUNTER — Ambulatory Visit: Payer: Medicare Other | Admitting: Internal Medicine

## 2013-08-10 ENCOUNTER — Telehealth: Payer: Self-pay | Admitting: *Deleted

## 2013-08-10 MED ORDER — GABAPENTIN 300 MG PO CAPS: ORAL_CAPSULE | ORAL | Status: DC

## 2013-08-10 NOTE — Telephone Encounter (Signed)
Received call from pharmacy requesting verification of pt's gabapentin dose. Pt states dose was recently increased to 2 caps three times a day. Verified direction change in EMR and sent rx with new directions.  Pt also stated that he was having trouble getting the lidocaine patch to stay on and pharmacy is requesting another alternative.  Pt already tried capsaicin cream without relief. Per verbal from Provider, no alternative cream/[patch. Ok to stop lidocaine and continue increased dose of gabapentin. If symptoms not improved after a few days call the office for appointment.

## 2013-08-13 ENCOUNTER — Encounter: Payer: Self-pay | Admitting: Physician Assistant

## 2013-08-13 ENCOUNTER — Ambulatory Visit (INDEPENDENT_AMBULATORY_CARE_PROVIDER_SITE_OTHER): Payer: Medicare Other | Admitting: Physician Assistant

## 2013-08-13 VITALS — BP 122/88 | HR 54 | Temp 97.8°F | Resp 18 | Wt 225.0 lb

## 2013-08-13 DIAGNOSIS — R599 Enlarged lymph nodes, unspecified: Secondary | ICD-10-CM | POA: Diagnosis not present

## 2013-08-13 DIAGNOSIS — B0229 Other postherpetic nervous system involvement: Secondary | ICD-10-CM

## 2013-08-13 MED ORDER — CEPHALEXIN 500 MG PO CAPS: 500.0000 mg | ORAL_CAPSULE | Freq: Three times a day (TID) | ORAL | Status: DC

## 2013-08-13 MED ORDER — PREGABALIN 75 MG PO CAPS: 75.0000 mg | ORAL_CAPSULE | Freq: Two times a day (BID) | ORAL | Status: DC

## 2013-08-13 NOTE — Assessment & Plan Note (Addendum)
Continue current regimen of Gabapentin 2 tablets 3 times per day.  Addition of 75 mg Pregabalin 2 times per day.  Continue lidocaine patches.  Return in 1 week. Right axillary lymphadenopathy most likely 2/2 shingles.  Rx for Keflex TID x 7 days.  Will need imaging if adeonpathy is still present at follow-up in 1 week.

## 2013-08-13 NOTE — Progress Notes (Signed)
Patient ID: [name] [name], male   DOB: 1941-10-04, 72 y.o.   MRN: [id number]   Patient is a 72 year-old gentleman with history of shingles (07/09/13) who presents c/o continued pain despite medical intervention at the location of previous shingles rash.  Patient denies new lesions.  Patient states he has been taking Gabapentin as prescribed with some relief of symptoms.  Patient states he tried OTC capsaicin but stopped 2/2 burning sensation.  Has been taking hydrocodone occasionally for pain without relief of symptoms.  Patient is also still having some R axillary lymphadenopathy noticed since last visit a couple of weeks ago.  Denies fever, chills, sweats, nausea, vomiting.  Past Medical History  Diagnosis Date  . GERD (gastroesophageal reflux disease)     controlled w/ diet and behavioral changes  . BPH (benign prostatic hypertrophy)   . Elevated PSA   . White coat hypertension   . Shingles 07/09/2013   Current Outpatient Prescriptions on File Prior to Visit  Medication Sig Dispense Refill  . aspirin 81 MG chewable tablet Chew 81 mg by mouth daily.        Marland Kitchen gabapentin (NEURONTIN) 300 MG capsule Take 2 capsules by mouth three times a day.  180 capsule  1  . HYDROcodone-acetaminophen (NORCO) 7.5-325 MG per tablet Take 1 tablet by mouth 3 (three) times daily as needed for pain.  60 tablet  0  . hydrOXYzine (ATARAX/VISTARIL) 25 MG tablet Take 1 tablet (25 mg total) by mouth 2 (two) times daily as needed for itching.  60 tablet  1  . lidocaine (LIDODERM) 5 % Place 1 patch onto the skin daily. Remove & Discard patch within 12 hours or as directed by MD  30 patch  1  . Multiple Vitamins-Minerals (MENS MULTIVITAMIN PLUS PO) Take 1 tablet by mouth daily.        . Omega-3 Fatty Acids (FISH OIL PO) Take 1,400 mg by mouth daily.       No current facility-administered medications on file prior to visit.   No Known Allergies Family History  Problem Relation Age of Onset  . Liver disease Mother   .  Emphysema Father   . Cancer Sister     brain  . Alcohol abuse Other    History   Social History  . Marital Status: Married    Spouse Name: [name]    Number of Children: 2  . Years of Education: N/A   Occupational History  . retired     Naval architect   Social History Main Topics  . Smoking status: Former Games developer  . Smokeless tobacco: Never Used  . Alcohol Use: Yes     Comment: 1 per day  . Drug Use: No  . Sexual Activity: None   Other Topics Concern  . None   Social History Narrative  . None   Review of Systems  Constitutional: Negative for fever, chills, weight loss, malaise/fatigue and diaphoresis.  HENT: Negative for neck pain.   Cardiovascular: Negative for chest pain and palpitations.  Musculoskeletal: Negative for myalgias, back pain and joint pain.  Skin:       Healed rash  Neurological: Positive for tingling. Negative for sensory change and headaches.       Burning pain across R chest extending into axilla and around to mid back   Filed Vitals:   08/13/13 1001  BP: 122/88  Pulse: 54  Temp: 97.8 F (36.6 C)  Resp: 18    Physical Exam  Vitals reviewed. Constitutional:  He is oriented to person, place, and time and well-developed, well-nourished, and in no distress.  HENT:  Head: Normocephalic and atraumatic.  Neck: Normal range of motion. Neck supple.  Lymphadenopathy:    He has no cervical adenopathy.    He has axillary adenopathy.       Right axillary: Pectoral adenopathy present. No lateral adenopathy present.       Left axillary: No pectoral and no lateral adenopathy present.      Right: No supraclavicular adenopathy present.       Left: No supraclavicular adenopathy present.  Neurological: He is alert and oriented to person, place, and time.  Skin: Skin is warm and dry.  Presence of a healed, scarred rash on the r upper chest extending to R axilla.  Similar scarring noticed on R upper back.  No evidence of new blister or lesion.  No evidence of  excoriation or secondary infection.   Assessment/Plan: Post herpetic neuralgia Continue current regimen of Gabapentin 2 tablets 3 times per day.  Addition of 75 mg Pregabalin 2 times per day.  Continue lidocaine patches.  Return in 1 week. Right axillary lymphadenopathy most likely 2/2 shingles.  Rx for Keflex TID x 7 days.  Will need imaging if adeonpathy is still present at follow-up in 1 week.

## 2013-08-13 NOTE — Patient Instructions (Signed)
Gabapentin 2 tablets 3 times/day.  Pregabalin 1 tablet twice daily.  Keflex 1 tablet 3 times a day for 7 days.  Return in 1 week.

## 2013-08-21 ENCOUNTER — Ambulatory Visit (INDEPENDENT_AMBULATORY_CARE_PROVIDER_SITE_OTHER): Payer: Medicare Other | Admitting: Physician Assistant

## 2013-08-21 ENCOUNTER — Other Ambulatory Visit: Payer: Self-pay | Admitting: Physician Assistant

## 2013-08-21 ENCOUNTER — Encounter: Payer: Self-pay | Admitting: Physician Assistant

## 2013-08-21 VITALS — BP 118/86 | HR 76 | Temp 97.8°F | Resp 18 | Wt 225.8 lb

## 2013-08-21 DIAGNOSIS — F411 Generalized anxiety disorder: Secondary | ICD-10-CM | POA: Diagnosis not present

## 2013-08-21 DIAGNOSIS — R2231 Localized swelling, mass and lump, right upper limb: Secondary | ICD-10-CM

## 2013-08-21 DIAGNOSIS — R222 Localized swelling, mass and lump, trunk: Secondary | ICD-10-CM | POA: Insufficient documentation

## 2013-08-21 DIAGNOSIS — B0229 Other postherpetic nervous system involvement: Secondary | ICD-10-CM | POA: Diagnosis not present

## 2013-08-21 DIAGNOSIS — F419 Anxiety disorder, unspecified: Secondary | ICD-10-CM

## 2013-08-21 DIAGNOSIS — R599 Enlarged lymph nodes, unspecified: Secondary | ICD-10-CM | POA: Diagnosis not present

## 2013-08-21 DIAGNOSIS — R229 Localized swelling, mass and lump, unspecified: Secondary | ICD-10-CM

## 2013-08-21 DIAGNOSIS — Z299 Encounter for prophylactic measures, unspecified: Secondary | ICD-10-CM

## 2013-08-21 DIAGNOSIS — R590 Localized enlarged lymph nodes: Secondary | ICD-10-CM

## 2013-08-21 MED ORDER — LIDOCAINE 5 % EX OINT: TOPICAL_OINTMENT | CUTANEOUS | Status: DC | PRN

## 2013-08-21 MED ORDER — LORAZEPAM 0.5 MG PO TABS: 0.5000 mg | ORAL_TABLET | Freq: Two times a day (BID) | ORAL | Status: DC | PRN

## 2013-08-21 NOTE — Assessment & Plan Note (Signed)
Lipoma vs Other lesion.  Due to patient's history of melanoma, will obtain MRI with focus on R axilla and lymph nodes.

## 2013-08-21 NOTE — Assessment & Plan Note (Signed)
Continue current therapy.  Rx lidocaine ointment.  D/C patches.  Patient informed that this may go away with time, but there is a chance that the neuralgia may persist.

## 2013-08-21 NOTE — Addendum Note (Signed)
Addended by: Marcelline Mates on: 08/21/2013 01:30 PM   Modules accepted: Orders

## 2013-08-21 NOTE — Progress Notes (Signed)
Patient ID: [name] [name], male   DOB: [birth year], 72 y.o.   MRN: [id number]  Patient presents to clinic today for follow-up of postherpetic neuralgia and R axillary mass.  Information was obtained from the patient.  Patient states that the pain is mildly improved with addition of pregabalin.  Is not using lidocaine patches anymore because they do not stay on well.  Denies new rash.  Patient continues to have enlarged mass of R axillary region.  Denies change to mass since first noticing it.  Patient states he never noticed the mass until after initial shingled outbreak.  Does endorse history of melanoma of the back that was excised with wide local excision.  Denies further management of melanoma.  Also endorses history of multiple lipomas.  Denies fever, chills, swelling or mass elsewhere.  Last saw dermatology in July.   Past Medical History  Diagnosis Date  . GERD (gastroesophageal reflux disease)     controlled w/ diet and behavioral changes  . BPH (benign prostatic hypertrophy)   . Elevated PSA   . White coat hypertension   . Shingles 07/09/2013    Current Outpatient Prescriptions on File Prior to Visit  Medication Sig Dispense Refill  . aspirin 81 MG chewable tablet Chew 81 mg by mouth daily.        Marland Kitchen gabapentin (NEURONTIN) 300 MG capsule Take 2 capsules by mouth three times a day.  180 capsule  1  . HYDROcodone-acetaminophen (NORCO) 7.5-325 MG per tablet Take 1 tablet by mouth 3 (three) times daily as needed for pain.  60 tablet  0  . hydrOXYzine (ATARAX/VISTARIL) 25 MG tablet Take 1 tablet (25 mg total) by mouth 2 (two) times daily as needed for itching.  60 tablet  1  . Multiple Vitamins-Minerals (MENS MULTIVITAMIN PLUS PO) Take 1 tablet by mouth daily.        . Omega-3 Fatty Acids (FISH OIL PO) Take 1,400 mg by mouth daily.      . pregabalin (LYRICA) 75 MG capsule Take 1 capsule (75 mg total) by mouth 2 (two) times daily.  60 capsule  0   No current facility-administered medications  on file prior to visit.    No Known Allergies  Family History  Problem Relation Age of Onset  . Liver disease Mother   . Emphysema Father   . Cancer Sister     brain  . Alcohol abuse Other     History   Social History  . Marital Status: Married    Spouse Name: [name]    Number of Children: 2  . Years of Education: N/A   Occupational History  . retired     Naval architect   Social History Main Topics  . Smoking status: Former Games developer  . Smokeless tobacco: Never Used  . Alcohol Use: Yes     Comment: 1 per day  . Drug Use: No  . Sexual Activity: None   Other Topics Concern  . None   Social History Narrative  . None    Review of Systems  Constitutional: Negative for fever, chills and malaise/fatigue.  Respiratory: Negative for cough.   Cardiovascular: Negative for chest pain and palpitations.  Gastrointestinal: Negative for nausea, vomiting and abdominal pain.  Musculoskeletal: Negative for myalgias.  Skin: Negative for rash.  Neurological: Positive for tingling. Negative for headaches.   Filed Vitals:   08/21/13 1040  BP: 118/86  Pulse: 76  Temp: 97.8 F (36.6 C)  Resp: 18   Physical Exam  Constitutional: He is oriented to person, place, and time and well-developed, well-nourished, and in no distress.  HENT:  Head: Normocephalic and atraumatic.  Right Ear: External ear normal.  Left Ear: External ear normal.  Nose: Nose normal.  Mouth/Throat: Oropharynx is clear and moist. No oropharyngeal exudate.  Eyes: Pupils are equal, round, and reactive to light.  Cardiovascular: Normal rate, regular rhythm, normal heart sounds and intact distal pulses.   Pulmonary/Chest: Effort normal and breath sounds normal.  Lymphadenopathy:       Head (right side): No submental, no submandibular, no tonsillar, no preauricular, no posterior auricular and no occipital adenopathy present.       Head (left side): No submental, no submandibular, no tonsillar, no preauricular, no  posterior auricular and no occipital adenopathy present.    He has no cervical adenopathy.  + Mass of right axillary region  Neurological: He is alert and oriented to person, place, and time.  Skin: Skin is warm and dry. No rash noted.   No results found for this or any previous visit (from the past 2160 hour(s)).  Assessment/Plan: No problem-specific assessment & plan notes found for this encounter.

## 2013-08-21 NOTE — Patient Instructions (Signed)
Prescription for topical lidocaine to help with pain.  You receive a call regarding your MRI.  I will call you with the results.

## 2013-08-23 LAB — BASIC METABOLIC PANEL
BUN: 22 mg/dL (ref 6–23)
CO2: 32 mEq/L (ref 19–32)
Glucose, Bld: 94 mg/dL (ref 70–99)
Potassium: 4.5 mEq/L (ref 3.5–5.3)
Sodium: 140 mEq/L (ref 135–145)

## 2013-08-25 ENCOUNTER — Ambulatory Visit (HOSPITAL_BASED_OUTPATIENT_CLINIC_OR_DEPARTMENT_OTHER)
Admission: RE | Admit: 2013-08-25 | Discharge: 2013-08-25 | Disposition: A | Discharge status: Home / Self Care | Payer: Medicare Other | Source: Ambulatory Visit | Attending: Physician Assistant | Admitting: Physician Assistant

## 2013-08-25 DIAGNOSIS — M75100 Unspecified rotator cuff tear or rupture of unspecified shoulder, not specified as traumatic: Secondary | ICD-10-CM | POA: Insufficient documentation

## 2013-08-25 DIAGNOSIS — M19019 Primary osteoarthritis, unspecified shoulder: Secondary | ICD-10-CM | POA: Insufficient documentation

## 2013-08-25 DIAGNOSIS — R229 Localized swelling, mass and lump, unspecified: Secondary | ICD-10-CM | POA: Insufficient documentation

## 2013-08-25 DIAGNOSIS — Z85820 Personal history of malignant melanoma of skin: Secondary | ICD-10-CM | POA: Insufficient documentation

## 2013-08-25 DIAGNOSIS — R590 Localized enlarged lymph nodes: Secondary | ICD-10-CM

## 2013-08-25 DIAGNOSIS — IMO0002 Reserved for concepts with insufficient information to code with codable children: Secondary | ICD-10-CM | POA: Insufficient documentation

## 2013-08-25 DIAGNOSIS — D179 Benign lipomatous neoplasm, unspecified: Secondary | ICD-10-CM | POA: Diagnosis not present

## 2013-08-25 MED ORDER — GADOBENATE DIMEGLUMINE 529 MG/ML IV SOLN: 20.0000 mL | Freq: Once | INTRAVENOUS | Status: AC | PRN

## 2013-09-12 ENCOUNTER — Other Ambulatory Visit: Payer: Self-pay | Admitting: Physician Assistant

## 2013-09-12 ENCOUNTER — Telehealth: Payer: Self-pay | Admitting: Family Medicine

## 2013-09-12 DIAGNOSIS — N400 Enlarged prostate without lower urinary tract symptoms: Secondary | ICD-10-CM

## 2013-09-12 DIAGNOSIS — R030 Elevated blood-pressure reading, without diagnosis of hypertension: Secondary | ICD-10-CM

## 2013-09-12 DIAGNOSIS — Z79899 Other long term (current) drug therapy: Secondary | ICD-10-CM

## 2013-09-12 NOTE — Telephone Encounter (Signed)
He could have a renal, cbc, tsh, renal for elevated bp and wellness exam, I would also love to get a lipid panel but I am not 100 % sure they will pay for it with those 2 diagnoses and I do not see anywhere in his chart that he has ever been told that he had a hi cholesterol. Run it if he wants it knowing there is a small chance he will get a bill

## 2013-09-12 NOTE — Telephone Encounter (Signed)
Patient states that he has a medicare wellness next week and would like to come in tomorrow or Friday morning for labs. He will be going to [facility] lab

## 2013-09-12 NOTE — Telephone Encounter (Signed)
Please advise lab request? Are labs done on medicare wellness visits?

## 2013-09-13 DIAGNOSIS — R030 Elevated blood-pressure reading, without diagnosis of hypertension: Secondary | ICD-10-CM | POA: Diagnosis not present

## 2013-09-13 DIAGNOSIS — Z79899 Other long term (current) drug therapy: Secondary | ICD-10-CM | POA: Diagnosis not present

## 2013-09-13 LAB — CBC WITH DIFFERENTIAL/PLATELET
Basophils Relative: 0 % (ref 0–1)
HCT: 43.7 % (ref 39.0–52.0)
Hemoglobin: 14.9 g/dL (ref 13.0–17.0)
Lymphocytes Relative: 25 % (ref 12–46)
Lymphs Abs: 1.9 10*3/uL (ref 0.7–4.0)
Monocytes Relative: 10 % (ref 3–12)
Neutro Abs: 4.7 10*3/uL (ref 1.7–7.7)
Neutrophils Relative %: 63 % (ref 43–77)
RBC: 4.96 MIL/uL (ref 4.22–5.81)

## 2013-09-13 LAB — BASIC METABOLIC PANEL
BUN: 17 mg/dL (ref 6–23)
CO2: 28 mEq/L (ref 19–32)
Calcium: 9.5 mg/dL (ref 8.4–10.5)
Creat: 0.9 mg/dL (ref 0.50–1.35)
Glucose, Bld: 89 mg/dL (ref 70–99)

## 2013-09-13 NOTE — Telephone Encounter (Signed)
Please advise refill? Last RX was done on 08-13-13 quantity 60 with 0 refills  If ok fax to [phone]

## 2013-09-13 NOTE — Telephone Encounter (Signed)
RX faxed

## 2013-09-13 NOTE — Telephone Encounter (Signed)
Upcoming lab orders placed for [facility]

## 2013-09-14 ENCOUNTER — Other Ambulatory Visit: Payer: Self-pay

## 2013-09-14 LAB — TSH: TSH: 2.842 u[IU]/mL (ref 0.350–4.500)

## 2013-09-14 MED ORDER — GABAPENTIN 300 MG PO CAPS: ORAL_CAPSULE | ORAL | Status: DC

## 2013-09-14 NOTE — Telephone Encounter (Signed)
Quick Note:  Patients wife Informed and voiced understanding ______

## 2013-09-14 NOTE — Telephone Encounter (Signed)
Ben with the pharmacy called stating that he wanted to make sure md wanted pt to take Gabapentin and Lyrica?  Per md stop Lyrica and take Gabapentin 300 mg 3 tid.  Ben informed and informed pt

## 2013-09-18 ENCOUNTER — Encounter: Payer: Self-pay | Admitting: Cardiology

## 2013-09-20 ENCOUNTER — Encounter: Payer: Self-pay | Admitting: Family Medicine

## 2013-09-20 ENCOUNTER — Ambulatory Visit (INDEPENDENT_AMBULATORY_CARE_PROVIDER_SITE_OTHER): Payer: Medicare Other | Admitting: Family Medicine

## 2013-09-20 VITALS — BP 126/82 | HR 65 | Temp 97.9°F | Ht 70.5 in | Wt 227.1 lb

## 2013-09-20 DIAGNOSIS — G479 Sleep disorder, unspecified: Secondary | ICD-10-CM | POA: Diagnosis not present

## 2013-09-20 DIAGNOSIS — Z1211 Encounter for screening for malignant neoplasm of colon: Secondary | ICD-10-CM | POA: Diagnosis not present

## 2013-09-20 DIAGNOSIS — Z0000 Encounter for general adult medical examination without abnormal findings: Secondary | ICD-10-CM

## 2013-09-20 DIAGNOSIS — B0229 Other postherpetic nervous system involvement: Secondary | ICD-10-CM

## 2013-09-20 DIAGNOSIS — G4730 Sleep apnea, unspecified: Secondary | ICD-10-CM

## 2013-09-20 DIAGNOSIS — R030 Elevated blood-pressure reading, without diagnosis of hypertension: Secondary | ICD-10-CM

## 2013-09-20 DIAGNOSIS — Z23 Encounter for immunization: Secondary | ICD-10-CM

## 2013-09-20 DIAGNOSIS — R0683 Snoring: Secondary | ICD-10-CM

## 2013-09-20 DIAGNOSIS — R0609 Other forms of dyspnea: Secondary | ICD-10-CM

## 2013-09-20 DIAGNOSIS — K219 Gastro-esophageal reflux disease without esophagitis: Secondary | ICD-10-CM | POA: Diagnosis not present

## 2013-09-20 MED ORDER — HYDROCODONE-ACETAMINOPHEN 7.5-325 MG PO TABS: 1.0000 | ORAL_TABLET | Freq: Three times a day (TID) | ORAL | Status: DC | PRN

## 2013-09-20 NOTE — Patient Instructions (Addendum)
Salon Pas patches, cream  Postherpetic Neuralgia Shingles is a painful disease. It is caused by the herpes zoster virus. This is the same virus which also causes chickenpox. It can affect the torso, limbs, or the face. For most people, shingles is a condition of rather sudden onset. Pain usually lasts about 1 month. In older patients, or patients with poor immune systems, a painful, long-standing (chronic) condition called postherpetic neuralgia can develop. This condition rarely happens before age 76. But at least 50% of people over 50 become affected following an attack of shingles. There is a natural tendency for this condition to improve over time with no treatment. Less than 5% of patients have pain that lasts for more than 1 year. DIAGNOSIS  Herpes is usually easily diagnosed on physical exam. Pain sometimes follows when the skin sores (lesions) have disappeared. It is called postherpetic neuralgia. That name simply means the pain that follows herpes. TREATMENT   Treating this condition may be difficult. Usually one of the tricyclic antidepressants, often amitriptyline, is the first line of treatment. There is evidence that the sooner these medications are given, the more likely they are to reduce pain.  Conventional analgesics, regional nerve blocks, and anticonvulsants have little benefit in most cases when used alone. Other tricyclic anti-depressants are used as a second option if the first antidepressant is unsuccessful.  Anticonvulsants, including carbamazepine, have been found to provide some added benefit when used with a tricyclic anti-depressant. This is especially for the stabbing type of pain similar to that of trigeminal neuralgia.  Chronic opioid therapy. This is a strong narcotic pain medication. It is used to treat pain that is resistant to other measures. The issues of dependency and tolerance can be reduced with closely managed care.  Some cream treatments are applied locally  to the affected area. They can help when used with other treatments. Their use may be difficult in the case of postherpetic trigeminal neuralgia. This is involved with the face. So the substances can irritate the eye and the skin around the eye. Examples of creams used include Capsaicin and lidocaine creams.  For shingles, antiviral therapies along with analgesics are recommended. Studies of the effect of anti-viral agents such as acyclovir on shingles have been done. They show improved rates of healing and decreased severity of sudden (acute) pain. Some observations suggest that nerve blocks during shingles infection will:  Reduce pain.  Shorten the acute episode.  Prevent the emergence of postherpetic neuralgia. Viral medications used include Acyclovir (Zovirax), Valacyclovir, Famciclovir and a lysine diet. Document Released: 03/05/2003 Document Revised: 03/06/2012 Document Reviewed: 12/13/2005 [facility] Patient Information 2014 [place], [place].

## 2013-09-22 ENCOUNTER — Encounter: Payer: Self-pay | Admitting: Family Medicine

## 2013-09-22 DIAGNOSIS — Z0000 Encounter for general adult medical examination without abnormal findings: Secondary | ICD-10-CM

## 2013-09-22 HISTORY — DX: Encounter for general adult medical examination without abnormal findings: Z00.00

## 2013-09-22 NOTE — Assessment & Plan Note (Addendum)
Agrees to flu shot today, referred to gastroenterology for colonoscopy. Reviewed fasting labs. Encouraged heart healthy diet, increase exercise.

## 2013-09-22 NOTE — Assessment & Plan Note (Signed)
Well controlled, no changes 

## 2013-09-22 NOTE — Assessment & Plan Note (Signed)
Witnessed apnea per wife, fatigue and stron fh of apnea, sleep study ordered today

## 2013-09-22 NOTE — Progress Notes (Signed)
Patient ID: [name] [name], male   DOB: [birth year], 72 y.o.   MRN: [id number] [name] [name] [id number] 1941/12/15 09/22/2013      Progress Note-Follow Up  Subjective  Chief Complaint  Chief Complaint  Patient presents with  . medicare wellness    annual  . Herpes Zoster    pain  . Injections    flu    HPI  Patient is a 72 year old Caucasian male in today for Medicare wellness exam and concerns. Generally feels well but he and his wife acknowledge that he is having a lot of trouble with restless sleep. She has witnessed apnea and he does snore routinely. Struggles with fatigue as well. No recent illness. No fevers or chills. Just finished a clinical trial for his urinary symptoms. He is in need of his next colonoscopy. No chest pain, palpitations, shortness or breath, GI or GU complaints.  Past Medical History  Diagnosis Date  . GERD (gastroesophageal reflux disease)     controlled w/ diet and behavioral changes  . BPH (benign prostatic hypertrophy)   . Elevated PSA   . White coat hypertension   . Shingles 07/09/2013  . Encounter for Medicare annual wellness exam 09/22/2013    Sees Dr Karlyn Agee of Derm Sees Dr Stan Head of Gastroenterology Sees Dr Ihor Gully of [facility] Sees Dr Normand Sloop of Optometry  . Snoring disorder 07/30/2011    No past surgical history on file.  Family History  Problem Relation Age of Onset  . Cancer Mother     MM, leukemia  . Emphysema Father   . Cancer Sister     brain  . Alcohol abuse Other   . COPD Brother   . Heart disease Paternal Grandmother   . COPD Paternal Grandfather   . Diabetes Sister   . Obesity Sister   . Down syndrome Brother     History   Social History  . Marital Status: Married    Spouse Name: [name]    Number of Children: 2  . Years of Education: N/A   Occupational History  . retired     Naval architect   Social History Main Topics  . Smoking status: Former Games developer  . Smokeless tobacco: Never Used  .  Alcohol Use: Yes     Comment: 1 per day  . Drug Use: No  . Sexual Activity: Not on file   Other Topics Concern  . Not on file   Social History Narrative  . No narrative on file    Current Outpatient Prescriptions on File Prior to Visit  Medication Sig Dispense Refill  . aspirin 81 MG chewable tablet Chew 81 mg by mouth daily.        Marland Kitchen gabapentin (NEURONTIN) 300 MG capsule Take 3 capsules by mouth three times a day.  270 capsule  2  . hydrOXYzine (ATARAX/VISTARIL) 25 MG tablet Take 1 tablet (25 mg total) by mouth 2 (two) times daily as needed for itching.  60 tablet  1  . lidocaine (XYLOCAINE) 5 % ointment Apply topically as needed.  35.44 g  0  . Multiple Vitamins-Minerals (MENS MULTIVITAMIN PLUS PO) Take 1 tablet by mouth daily.        . Omega-3 Fatty Acids (FISH OIL PO) Take 1,400 mg by mouth daily.       No current facility-administered medications on file prior to visit.    No Known Allergies  Review of Systems  Review of Systems  Constitutional: Positive for malaise/fatigue. Negative for  fever and chills.  HENT: Negative for hearing loss, nosebleeds and congestion.   Eyes: Negative for discharge.  Respiratory: Negative for cough, sputum production, shortness of breath and wheezing.   Cardiovascular: Negative for chest pain, palpitations and leg swelling.  Gastrointestinal: Negative for heartburn, nausea, vomiting, abdominal pain, diarrhea, constipation and blood in stool.  Genitourinary: Negative for dysuria, urgency, frequency and hematuria.  Musculoskeletal: Negative for myalgias, back pain and falls.  Skin: Negative for rash.  Neurological: Negative for dizziness, tremors, sensory change, focal weakness, loss of consciousness, weakness and headaches.  Endo/Heme/Allergies: Negative for polydipsia. Does not bruise/bleed easily.  Psychiatric/Behavioral: Negative for depression and suicidal ideas. The patient is not nervous/anxious and does not have insomnia.      Objective  BP 126/82  Pulse 65  Temp(Src) 97.9 F (36.6 C) (Oral)  Ht 5' 10.5" (1.791 m)  Wt 227 lb 1.9 oz (103.021 kg)  BMI 32.12 kg/m2  SpO2 93%  Physical Exam  Physical Exam  Constitutional: He is oriented to person, place, and time and well-developed, well-nourished, and in no distress. No distress.  HENT:  Head: Normocephalic and atraumatic.  Eyes: Conjunctivae are normal.  Neck: Neck supple. No thyromegaly present.  Cardiovascular: Normal rate, regular rhythm and normal heart sounds.   No murmur heard. Pulmonary/Chest: Effort normal and breath sounds normal. No respiratory distress.  Abdominal: He exhibits no distension and no mass. There is no tenderness.  Musculoskeletal: He exhibits no edema.  Neurological: He is alert and oriented to person, place, and time.  Skin: Skin is warm.  Psychiatric: Memory, affect and judgment normal.    Lab Results  Component Value Date   TSH 2.842 09/13/2013   Lab Results  Component Value Date   WBC 7.5 09/13/2013   HGB 14.9 09/13/2013   HCT 43.7 09/13/2013   MCV 88.1 09/13/2013   PLT 208 09/13/2013   Lab Results  Component Value Date   CREATININE 0.90 09/13/2013   BUN 17 09/13/2013   NA 137 09/13/2013   K 4.7 09/13/2013   CL 103 09/13/2013   CO2 28 09/13/2013   Lab Results  Component Value Date   ALT 19 08/03/2012   AST 18 08/03/2012   ALKPHOS 52 08/03/2012   BILITOT 0.6 08/03/2012   Lab Results  Component Value Date   CHOL 157 07/20/2011   Lab Results  Component Value Date   HDL 46 07/20/2011   Lab Results  Component Value Date   LDLCALC 95 07/20/2011   Lab Results  Component Value Date   TRIG 82 07/20/2011   Lab Results  Component Value Date   CHOLHDL 3.4 07/20/2011     Assessment & Plan  ELEVATED BP READING WITHOUT DX HYPERTENSION Well controlled, no changes   GERD Improved, add probiotics. Avoid offending foods.  Encounter for Medicare annual wellness exam Agrees to flu shot today, referred to  gastroenterology for colonoscopy. Reviewed fasting labs. Encouraged heart healthy diet, increase exercise.   Snoring disorder Witnessed apnea per wife, fatigue and stron fh of apnea, sleep study ordered today

## 2013-09-22 NOTE — Assessment & Plan Note (Signed)
Improved, add probiotics. Avoid offending foods.

## 2013-09-27 DIAGNOSIS — H2510 Age-related nuclear cataract, unspecified eye: Secondary | ICD-10-CM | POA: Diagnosis not present

## 2013-10-02 ENCOUNTER — Encounter: Payer: Self-pay | Admitting: Internal Medicine

## 2013-10-30 DIAGNOSIS — L821 Other seborrheic keratosis: Secondary | ICD-10-CM | POA: Diagnosis not present

## 2013-10-30 DIAGNOSIS — Z85820 Personal history of malignant melanoma of skin: Secondary | ICD-10-CM | POA: Diagnosis not present

## 2013-10-30 DIAGNOSIS — L570 Actinic keratosis: Secondary | ICD-10-CM | POA: Diagnosis not present

## 2013-11-13 ENCOUNTER — Ambulatory Visit (INDEPENDENT_AMBULATORY_CARE_PROVIDER_SITE_OTHER): Payer: Medicare Other | Admitting: Family Medicine

## 2013-11-13 ENCOUNTER — Ambulatory Visit (HOSPITAL_BASED_OUTPATIENT_CLINIC_OR_DEPARTMENT_OTHER): Payer: Medicare Other | Attending: Family Medicine | Admitting: Radiology

## 2013-11-13 ENCOUNTER — Encounter: Payer: Self-pay | Admitting: Family Medicine

## 2013-11-13 VITALS — Ht 69.5 in | Wt 220.0 lb

## 2013-11-13 VITALS — BP 122/82 | HR 56 | Temp 97.5°F | Ht 70.5 in | Wt 229.1 lb

## 2013-11-13 DIAGNOSIS — Z23 Encounter for immunization: Secondary | ICD-10-CM

## 2013-11-13 DIAGNOSIS — R52 Pain, unspecified: Secondary | ICD-10-CM

## 2013-11-13 DIAGNOSIS — G479 Sleep disorder, unspecified: Secondary | ICD-10-CM

## 2013-11-13 DIAGNOSIS — R030 Elevated blood-pressure reading, without diagnosis of hypertension: Secondary | ICD-10-CM

## 2013-11-13 DIAGNOSIS — B0229 Other postherpetic nervous system involvement: Secondary | ICD-10-CM

## 2013-11-13 DIAGNOSIS — G4761 Periodic limb movement disorder: Secondary | ICD-10-CM | POA: Insufficient documentation

## 2013-11-13 DIAGNOSIS — M25519 Pain in unspecified shoulder: Secondary | ICD-10-CM

## 2013-11-13 DIAGNOSIS — G4733 Obstructive sleep apnea (adult) (pediatric): Secondary | ICD-10-CM | POA: Insufficient documentation

## 2013-11-13 DIAGNOSIS — K219 Gastro-esophageal reflux disease without esophagitis: Secondary | ICD-10-CM

## 2013-11-13 DIAGNOSIS — M25511 Pain in right shoulder: Secondary | ICD-10-CM

## 2013-11-13 DIAGNOSIS — G4730 Sleep apnea, unspecified: Secondary | ICD-10-CM

## 2013-11-13 MED ORDER — HYDROCODONE-ACETAMINOPHEN 5-325 MG PO TABS: 1.0000 | ORAL_TABLET | Freq: Four times a day (QID) | ORAL | Status: DC | PRN

## 2013-11-13 NOTE — Progress Notes (Signed)
Pre visit review using our clinic review tool, if applicable. No additional management support is needed unless otherwise documented below in the visit note. 

## 2013-11-13 NOTE — Progress Notes (Signed)
Patient ID: [name] [name], male   DOB: Nov 25, 1941, 72 y.o.   MRN: [id number] Tj Kitchings [id number] 02/26/1941 11/13/2013      Progress Note-Follow Up  Subjective  Chief Complaint  Chief Complaint  Patient presents with  . Follow-up    8 week  . Injections    prevnar    HPI  Patient is a 72 year old Caucasian male who is in today with persistent pain from his pulse herpetic neuralgia. He has weakness somewhat in his right shoulder as well secondary to the significant neuropathy. No other recent illness. No chest pain, palpitations, shortness of breath, GI or GU concerns noted today is taking medications as prescribed. Does acknowledge that shooting pain he is somewhat less in intensity and it previously has been.  Past Medical History  Diagnosis Date  . GERD (gastroesophageal reflux disease)     controlled w/ diet and behavioral changes  . BPH (benign prostatic hypertrophy)   . Elevated PSA   . White coat hypertension   . Shingles 07/09/2013  . Encounter for Medicare annual wellness exam 09/22/2013    Sees Dr Karlyn Agee of Derm Sees Dr Stan Head of Gastroenterology Sees Dr Ihor Gully of [facility] Sees Dr Normand Sloop of Optometry  . Snoring disorder 07/30/2011    History reviewed. No pertinent past surgical history.  Family History  Problem Relation Age of Onset  . Cancer Mother     MM, leukemia  . Emphysema Father   . Cancer Sister     brain  . Alcohol abuse Other   . COPD Brother   . Heart disease Paternal Grandmother   . COPD Paternal Grandfather   . Diabetes Sister   . Obesity Sister   . Down syndrome Brother     History   Social History  . Marital Status: Married    Spouse Name: [name]    Number of Children: 2  . Years of Education: N/A   Occupational History  . retired     Naval architect   Social History Main Topics  . Smoking status: Former Games developer  . Smokeless tobacco: Never Used  . Alcohol Use: Yes     Comment: 1 per day  . Drug  Use: No  . Sexual Activity: Not on file   Other Topics Concern  . Not on file   Social History Narrative  . No narrative on file    Current Outpatient Prescriptions on File Prior to Visit  Medication Sig Dispense Refill  . aspirin 81 MG chewable tablet Chew 81 mg by mouth daily.        Marland Kitchen gabapentin (NEURONTIN) 300 MG capsule Take 3 capsules by mouth three times a day.  270 capsule  2  . HYDROcodone-acetaminophen (NORCO) 7.5-325 MG per tablet Take 1 tablet by mouth 3 (three) times daily as needed for pain.  60 tablet  1  . Multiple Vitamins-Minerals (MENS MULTIVITAMIN PLUS PO) Take 1 tablet by mouth daily.        . Omega-3 Fatty Acids (FISH OIL PO) Take 1,400 mg by mouth daily.       No current facility-administered medications on file prior to visit.    No Known Allergies  Review of Systems  Review of Systems  Constitutional: Negative for fever and malaise/fatigue.  HENT: Negative for congestion.   Eyes: Negative for discharge.  Respiratory: Negative for shortness of breath.   Cardiovascular: Negative for chest pain, palpitations and leg swelling.  Gastrointestinal: Negative for nausea, abdominal pain and  diarrhea.  Genitourinary: Negative for dysuria.  Musculoskeletal: Negative for falls.  Skin: Negative for rash.  Neurological: Negative for loss of consciousness and headaches.  Endo/Heme/Allergies: Negative for polydipsia.  Psychiatric/Behavioral: Negative for depression and suicidal ideas. The patient is not nervous/anxious and does not have insomnia.     Objective  BP 122/82  Pulse 56  Temp(Src) 97.5 F (36.4 C) (Oral)  Ht 5' 10.5" (1.791 m)  Wt 229 lb 1.3 oz (103.91 kg)  BMI 32.39 kg/m2  SpO2 96%  Physical Exam  Physical Exam  Constitutional: He is oriented to person, place, and time and well-developed, well-nourished, and in no distress. No distress.  HENT:  Head: Normocephalic and atraumatic.  Eyes: Conjunctivae are normal.  Neck: Neck supple. No  thyromegaly present.  Cardiovascular: Normal rate, regular rhythm and normal heart sounds.   No murmur heard. Pulmonary/Chest: Effort normal and breath sounds normal. No respiratory distress.  Abdominal: He exhibits no distension and no mass. There is no tenderness.  Musculoskeletal: He exhibits no edema.  Neurological: He is alert and oriented to person, place, and time.  Skin: Skin is warm.  Psychiatric: Memory, affect and judgment normal.    Lab Results  Component Value Date   TSH 2.842 09/13/2013   Lab Results  Component Value Date   WBC 7.5 09/13/2013   HGB 14.9 09/13/2013   HCT 43.7 09/13/2013   MCV 88.1 09/13/2013   PLT 208 09/13/2013   Lab Results  Component Value Date   CREATININE 0.90 09/13/2013   BUN 17 09/13/2013   NA 137 09/13/2013   K 4.7 09/13/2013   CL 103 09/13/2013   CO2 28 09/13/2013   Lab Results  Component Value Date   ALT 19 08/03/2012   AST 18 08/03/2012   ALKPHOS 52 08/03/2012   BILITOT 0.6 08/03/2012   Lab Results  Component Value Date   CHOL 157 07/20/2011   Lab Results  Component Value Date   HDL 46 07/20/2011   Lab Results  Component Value Date   LDLCALC 95 07/20/2011   Lab Results  Component Value Date   TRIG 82 07/20/2011   Lab Results  Component Value Date   CHOLHDL 3.4 07/20/2011     Assessment & Plan  ELEVATED BP READING WITHOUT DX HYPERTENSION Well controlled no changes.  Post herpetic neuralgia Has been left with right posterior shoulder pain and weakness, sent for physical therapy and my try topcial NSAIDs, continue Gabapentin and may use Hydrocodone for severe pain  GERD Doing well, avoid offending foods.

## 2013-11-14 ENCOUNTER — Ambulatory Visit: Payer: Medicare Other | Attending: Family Medicine | Admitting: Rehabilitation

## 2013-11-14 DIAGNOSIS — IMO0001 Reserved for inherently not codable concepts without codable children: Secondary | ICD-10-CM | POA: Diagnosis not present

## 2013-11-14 DIAGNOSIS — M546 Pain in thoracic spine: Secondary | ICD-10-CM | POA: Diagnosis not present

## 2013-11-14 DIAGNOSIS — M25519 Pain in unspecified shoulder: Secondary | ICD-10-CM | POA: Insufficient documentation

## 2013-11-16 DIAGNOSIS — G4730 Sleep apnea, unspecified: Secondary | ICD-10-CM

## 2013-11-16 DIAGNOSIS — G479 Sleep disorder, unspecified: Secondary | ICD-10-CM

## 2013-11-16 DIAGNOSIS — G4733 Obstructive sleep apnea (adult) (pediatric): Secondary | ICD-10-CM

## 2013-11-17 NOTE — Procedures (Signed)
[name], [name]                ACCOUNT NO.:  [id number]  MEDICAL RECORD NO.:  [id number]          PATIENT TYPE:  OUT  LOCATION:  SLEEP CENTER                 FACILITY:  [facility]  PHYSICIAN:  [name] Milch, MD      DATE OF BIRTH:  1941-12-01  DATE OF STUDY:  11/13/2013                           NOCTURNAL POLYSOMNOGRAM  REFERRING PHYSICIAN:  Danise Edge, MD  INDICATION FOR STUDY:  Witnessed apneas, loud snoring, and restless sleep in this 72 year old gentleman with white coat hypertension.  At the time of this study, he weighed 220 pounds with a height of 5 feet 10 inches, BMI of 32, and neck size of 17.5 inches.  EPWORTH SLEEPINESS SCORE:  12.  This interventional polysomnogram was performed with a sleep technologist in attendance.  EEG, EOG, EMG, EKG, and respiratory parameters were recorded.  Sleep stages, arousals, limb movements, and respiratory data were scored according to criteria laid out by the American Academy of Sleep Medicine.Marland Kitchen  SLEEP ARCHITECTURE:  Lights out was at 09:33 p.m. and lights on was at 05:12 a.m.  CPAP was initiated at 11:48 p.m.Marland Kitchen  During the diagnostic portion, total sleep time was 128 minutes with a sleep period time of 131 minutes, and a sleep efficiency of 95%.  Sleep latency was 45 minutes and latency to REM sleep was 79 minutes with wake after sleep onset of 2.5 minutes.  Sleep stages of the percentage of total sleep time was N1 7.4%, N2 78.6%, N3 0%, and REM sleep 14%.  Supine sleep was not noted.  During the titration portion, REM sleep accounted for 57 minutes with 31 minutes of supine REM sleep.  AROUSAL DATA:  During the diagnostic portion, there were 21 arousals with an arousal index of 10 events per hour.  During the titration portion, this was 23 events an hour.  RESPIRATORY DATA:  During the diagnostic portion, there were 21 obstructive apnea, 0 central apnea, 0 mixed apneas, and 58 hypopneas with an apnea-hypopnea index of 37 events  per hour and the lowest desaturation of 81%.  Due to this degree of respiratory disturbance, CPAP was initiated at 5 cm with a large nasal mask and titrated to a final level of 13 cm.  At a level of 12 cm for 112 minutes including 33 minutes of REM sleep, 12 central apneas, 2 mixed apneas, and 1 hypopnea were noted with an apnea-hypopnea index of 8 events per hour and the lowest desaturation of 93%.  At the final level of 13 cm from 0.9 minutes of REM sleep, no events were noted.  This appears to be the optimal level used during the study.  OXYGEN DATA:  The lowest desaturation was 81%, but with titration, was 91%.  He spent 0 minutes with saturation less than 88% on the CPAP.  CARDIAC DATA:  The low heart rate was 38 beats per minute.  The high heart rate recorded was an artifact.  No arrhythmias were noted.  There was some bradycardia noted through the night.  MOVEMENT-PARASOMNIA:  During the diagnostic portion, limb PLM index was 24 events per hour, which disappeared during titration.  DISCUSSION:  He was desensitized with a large  nasal mask with a chin strap.  Audible snoring was noted in the supine position.  He tolerated the CPAP well.  IMPRESSIONS: 1. Severe obstructive sleep apnea with hypopneas causing sleep     fragmentation and oxygen desaturation. 2. This was corrected with CPAP of 13 cm with a large nasal mask.     CPAP titration was optimal. 3. No evidence of cardiac arrhythmias or behavioral disturbance during     sleep. 4. Periodic limb movements were noted, which disappeared with the     CPAP.  RECOMMENDATIONS:  1. The treatment options for this degree of sleep disorder breathing     include weight loss and CPAP therapy. 2. CPAP can be initiated at 13 cm with a large nasal mask and     compliance monitored at this level. 3. He should be cautioned against driving when sleepy.  He should be     asked to avoid medications with sedative side  effects.     [name] Milch, MD    RVA/MEDQ  D:  11/16/2013 13:16:14  T:  11/17/2013 02:46:13  Job:  [id number]

## 2013-11-18 NOTE — Assessment & Plan Note (Signed)
Well controlled no changes 

## 2013-11-18 NOTE — Assessment & Plan Note (Signed)
Has been left with right posterior shoulder pain and weakness, sent for physical therapy and my try topcial NSAIDs, continue Gabapentin and may use Hydrocodone for severe pain

## 2013-11-18 NOTE — Assessment & Plan Note (Signed)
Doing well, avoid offending foods.

## 2013-11-21 ENCOUNTER — Ambulatory Visit: Payer: Medicare Other | Admitting: Rehabilitation

## 2013-11-26 ENCOUNTER — Ambulatory Visit: Payer: Medicare Other | Attending: Family Medicine | Admitting: Rehabilitation

## 2013-11-26 DIAGNOSIS — M546 Pain in thoracic spine: Secondary | ICD-10-CM | POA: Diagnosis not present

## 2013-11-26 DIAGNOSIS — M25519 Pain in unspecified shoulder: Secondary | ICD-10-CM | POA: Insufficient documentation

## 2013-11-26 DIAGNOSIS — IMO0001 Reserved for inherently not codable concepts without codable children: Secondary | ICD-10-CM | POA: Diagnosis not present

## 2013-11-27 ENCOUNTER — Telehealth: Payer: Self-pay

## 2013-11-27 ENCOUNTER — Other Ambulatory Visit: Payer: Self-pay | Admitting: Family Medicine

## 2013-11-27 DIAGNOSIS — B0229 Other postherpetic nervous system involvement: Secondary | ICD-10-CM

## 2013-11-27 DIAGNOSIS — M25519 Pain in unspecified shoulder: Secondary | ICD-10-CM

## 2013-11-27 DIAGNOSIS — G4733 Obstructive sleep apnea (adult) (pediatric): Secondary | ICD-10-CM

## 2013-11-27 NOTE — Telephone Encounter (Signed)
So his sleep study showed sleep apnea so I am going to order a cpap machine and a company will come out and get him set up. If he has trouble sleeping with it on I can help him with that

## 2013-11-27 NOTE — Telephone Encounter (Signed)
Patient left a message stating that he has not heard from anyone about this CPAP?

## 2013-11-27 NOTE — Telephone Encounter (Signed)
Please confirm with patient he has his CPAP and is happy with it. I cannot tell if he has it all set up or I need to help. ----- Message ----- From: Oretha Milch, MD Sent: 11/18/2013 6:47 PM To: Bradd Canary, MD   I left a detailed message on pts vm and asked for him to call back to verify this.

## 2013-11-28 ENCOUNTER — Ambulatory Visit: Payer: Medicare Other | Admitting: Rehabilitation

## 2013-11-28 NOTE — Telephone Encounter (Signed)
Physical therapy called and stated they are having success with this patient using the tenns unit for his pain.  They would like Dr Abner Greenspan to put in an order for a home unit

## 2013-11-29 ENCOUNTER — Ambulatory Visit (AMBULATORY_SURGERY_CENTER): Payer: Self-pay

## 2013-11-29 VITALS — Ht 70.0 in | Wt 218.0 lb

## 2013-11-29 DIAGNOSIS — Z860100 Personal history of colon polyps, unspecified: Secondary | ICD-10-CM

## 2013-11-29 MED ORDER — SUPREP BOWEL PREP KIT 17.5-3.13-1.6 GM/177ML PO SOLN: 1.0000 | Freq: Once | ORAL | Status: DC

## 2013-11-29 NOTE — Telephone Encounter (Signed)
OK to send in an rx see if you can find an order in Epic for a TENS or I can hand write an order have PT send [name] over their paperwork so we can document (or print if in our system for review)

## 2013-11-29 NOTE — Telephone Encounter (Signed)
Please advise 

## 2013-12-03 ENCOUNTER — Ambulatory Visit: Payer: Medicare Other | Admitting: Rehabilitation

## 2013-12-03 NOTE — Telephone Encounter (Signed)
So can use post herpetic neuralgia and shoulder pain for TENS unit

## 2013-12-03 NOTE — Telephone Encounter (Signed)
pts visits are in our system.  What diagnosis are we using for this?

## 2013-12-05 ENCOUNTER — Ambulatory Visit: Payer: Medicare Other | Admitting: Rehabilitation

## 2013-12-05 NOTE — Telephone Encounter (Signed)
Patients spouse informed that RX is done but Dr Abner Greenspan can't sign until Friday around 10. Pts spouse informed that pt could come after 11 on Friday to pick this up.  pts spouse voiced understandin

## 2013-12-10 ENCOUNTER — Ambulatory Visit: Payer: Medicare Other | Admitting: Rehabilitation

## 2013-12-12 ENCOUNTER — Encounter: Payer: Medicare Other | Admitting: Rehabilitation

## 2013-12-13 ENCOUNTER — Encounter: Payer: Self-pay | Admitting: Internal Medicine

## 2013-12-13 ENCOUNTER — Ambulatory Visit (AMBULATORY_SURGERY_CENTER): Payer: Medicare Other | Admitting: Internal Medicine

## 2013-12-13 VITALS — BP 142/81 | HR 46 | Temp 97.1°F | Resp 24 | Ht 70.0 in | Wt 218.0 lb

## 2013-12-13 DIAGNOSIS — Z1211 Encounter for screening for malignant neoplasm of colon: Secondary | ICD-10-CM | POA: Diagnosis not present

## 2013-12-13 DIAGNOSIS — K219 Gastro-esophageal reflux disease without esophagitis: Secondary | ICD-10-CM | POA: Diagnosis not present

## 2013-12-13 DIAGNOSIS — D126 Benign neoplasm of colon, unspecified: Secondary | ICD-10-CM | POA: Diagnosis not present

## 2013-12-13 DIAGNOSIS — D128 Benign neoplasm of rectum: Secondary | ICD-10-CM

## 2013-12-13 MED ORDER — SODIUM CHLORIDE 0.9 % IV SOLN: 500.0000 mL | INTRAVENOUS | Status: DC

## 2013-12-13 NOTE — Progress Notes (Signed)
Lidocaine-40mg IV prior to Propofol InductionPropofol given over incremental dosages 

## 2013-12-13 NOTE — Patient Instructions (Addendum)
I found and removed 2 very small polyps that look benign.  I will let you know pathology results and when to have another routine colonoscopy by mail.  You also have a condition called diverticulosis - common and not usually a problem. Please read the handout provided.   I appreciate the opportunity to care for you. Iva Boop, MD, FACG    YOU HAD AN ENDOSCOPIC PROCEDURE TODAY AT THE [facility]: Refer to the procedure report that was given to you for any specific questions about what was found during the examination.  If the procedure report does not answer your questions, please call your gastroenterologist to clarify.  If you requested that your care partner not be given the details of your procedure findings, then the procedure report has been included in a sealed envelope for you to review at your convenience later.  YOU SHOULD EXPECT: Some feelings of bloating in the abdomen. Passage of more gas than usual.  Walking can help get rid of the air that was put into your GI tract during the procedure and reduce the bloating. If you had a lower endoscopy (such as a colonoscopy or flexible sigmoidoscopy) you may notice spotting of blood in your stool or on the toilet paper. If you underwent a bowel prep for your procedure, then you may not have a normal bowel movement for a few days.  DIET: Your first meal following the procedure should be a light meal and then it is ok to progress to your normal diet.  A half-sandwich or bowl of soup is an example of a good first meal.  Heavy or fried foods are harder to digest and may make you feel nauseous or bloated.  Likewise meals heavy in dairy and vegetables can cause extra gas to form and this can also increase the bloating.  Drink plenty of fluids but you should avoid alcoholic beverages for 24 hours.  ACTIVITY: Your care partner should take you home directly after the procedure.  You should plan to take it easy, moving slowly for the  rest of the day.  You can resume normal activity the day after the procedure however you should NOT DRIVE or use heavy machinery for 24 hours (because of the sedation medicines used during the test).    SYMPTOMS TO REPORT IMMEDIATELY: A gastroenterologist can be reached at any hour.  During normal business hours, 8:30 AM to 5:00 PM Monday through Friday, call [phone].  After hours and on weekends, please call the GI answering service at [phone] who will take a message and have the physician on call contact you.   Following lower endoscopy (colonoscopy or flexible sigmoidoscopy):  Excessive amounts of blood in the stool  Significant tenderness or worsening of abdominal pains  Swelling of the abdomen that is new, acute  Fever of 100F or higher  FOLLOW UP: If any biopsies were taken you will be contacted by phone or by letter within the next 1-3 weeks.  Call your gastroenterologist if you have not heard about the biopsies in 3 weeks.  Our staff will call the home number listed on your records the next business day following your procedure to check on you and address any questions or concerns that you may have at that time regarding the information given to you following your procedure. This is a courtesy call and so if there is no answer at the home number and we have not heard from you through the emergency physician  on call, we will assume that you have returned to your regular daily activities without incident.  SIGNATURES/CONFIDENTIALITY: You and/or your care partner have signed paperwork which will be entered into your electronic medical record.  These signatures attest to the fact that that the information above on your After Visit Summary has been reviewed and is understood.  Full responsibility of the confidentiality of this discharge information lies with you and/or your care-partner.   Diverticulosis and polyp information given.

## 2013-12-13 NOTE — Op Note (Signed)
[facility] [address] [place], [address]   COLONOSCOPY PROCEDURE REPORT  PATIENT: Donnel, Venuto  MR#: [id number] BIRTHDATE: 08/21/1941 , 72  yrs. old GENDER: Male ENDOSCOPIST: Iva Boop, MD, [facility] PROCEDURE DATE:  12/13/2013 PROCEDURE:   Colonoscopy with snare polypectomy First Screening Colonoscopy - Avg.  risk and is 50 yrs.  old or older - No.  Prior Negative Screening - Now for repeat screening. 10 or more years since last screening  History of Adenoma - Now for follow-up colonoscopy & has been > or = to 3 yrs.  N/A  Polyps Removed Today? Yes. ASA CLASS:   Class II INDICATIONS:average risk screening and Last colonoscopy performed 10 years ago. MEDICATIONS: propofol (Diprivan) 150mg  IV, MAC sedation, administered by CRNA, and These medications were titrated to patient response per physician's verbal order  DESCRIPTION OF PROCEDURE:   After the risks benefits and alternatives of the procedure were thoroughly explained, informed consent was obtained.  A digital rectal exam revealed no abnormalities of the rectum, A digital rectal exam revealed the prostate was not enlarged, and A digital rectal exam revealed no prostatic nodules.   The LB [id number]  endoscope was introduced through the anus and advanced to the cecum, which was identified by both the appendix and ileocecal valve. No adverse events experienced.   The quality of the prep was Suprep good  The instrument was then slowly withdrawn as the colon was fully examined.  COLON FINDINGS: Two diminutive sessile polyps were found at the splenic flexure and in the rectum.  A polypectomy was performed with a cold snare.  The resection was complete and the polyp tissue was completely retrieved.   Mild diverticulosis was noted The finding was in the right colon.   Moderate diverticulosis was noted in the sigmoid colon.   The colon mucosa was otherwise normal.   A right colon retroflexion was  performed.  Retroflexed views revealed no abnormalities. The time to cecum=1 minutes 37 seconds. Withdrawal time=8 minutes 20 seconds.  The scope was withdrawn and the procedure completed. COMPLICATIONS: There were no complications.  ENDOSCOPIC IMPRESSION: 1.   Two diminutive sessile polyps were found at the splenic flexure and in the rectum; polypectomy was performed with a cold snare 2.   Mild diverticulosis was noted in the right colon 3.   Moderate diverticulosis was noted in the sigmoid colon 4.   The colon mucosa was otherwise normal - good prep  RECOMMENDATIONS: Timing of repeat colonoscopy will be determined by pathology findings.  eSigned:  Iva Boop, MD, [facility] 12/13/2013 9:16 AM   cc: Reuel Derby, MD and The Patient

## 2013-12-13 NOTE — Progress Notes (Signed)
Called to room to assist during endoscopic procedure.  Patient ID and intended procedure confirmed with present staff. Received instructions for my participation in the procedure from the performing physician.  

## 2013-12-14 ENCOUNTER — Ambulatory Visit: Payer: Medicare Other | Admitting: Rehabilitation

## 2013-12-14 ENCOUNTER — Telehealth: Payer: Self-pay

## 2013-12-14 NOTE — Telephone Encounter (Signed)
  Follow up Call-  Call back number 12/13/2013  Post procedure Call Back phone  # [phone]  Permission to leave phone message Yes     Patient questions:  Do you have a fever, pain , or abdominal swelling? no Pain Score  0 *  Have you tolerated food without any problems? yes  Have you been able to return to your normal activities? yes  Do you have any questions about your discharge instructions: Diet   no Medications  no Follow up visit  no  Do you have questions or concerns about your Care? no  Actions: * If pain score is 4 or above: No action needed, pain <4.

## 2013-12-17 ENCOUNTER — Ambulatory Visit: Payer: Medicare Other | Admitting: Rehabilitation

## 2013-12-19 ENCOUNTER — Ambulatory Visit: Payer: Medicare Other | Admitting: Rehabilitation

## 2013-12-23 ENCOUNTER — Encounter: Payer: Self-pay | Admitting: Internal Medicine

## 2013-12-25 ENCOUNTER — Other Ambulatory Visit: Payer: Self-pay | Admitting: Physician Assistant

## 2013-12-25 ENCOUNTER — Ambulatory Visit: Payer: Medicare Other | Admitting: Rehabilitation

## 2013-12-25 ENCOUNTER — Telehealth: Payer: Self-pay | Admitting: Family Medicine

## 2013-12-25 NOTE — Telephone Encounter (Signed)
PATIENT HAD A SLEEP STUDY 2 MONTHS AGO.  HE HAS NOT GOTTEN THE RESULTS  SOMEONE CALLED THE HOUSE ASKING ABOUT THE CPAP MACHINE.  HE HAS NOT HEARD ANYTHING ABOUT THE CPAP MACHINE.  PLEASE ADVISE WHAT IS GOING ON.  WHERE AND HOW WOULD HE GET THE CPAP MACHINE

## 2013-12-25 NOTE — Telephone Encounter (Signed)
Notified pt per 11/27/13 phone note of sleep apnea and need to start CPAP machine. Referral was faxed to [facility] but pt states no one has contacted him about this.    [name] [name],  Can you call Christoper Allegra to check on status and let pt know the outcome?

## 2013-12-25 NOTE — Telephone Encounter (Signed)
Please advise refill? Last RX was done on 08-21-13 quantity 35.44g with 0 refills

## 2013-12-26 NOTE — Telephone Encounter (Signed)
[facility] dropped paperwork off, was sent back to MD on 12/08/13. I did kept a copy of orginal, sent back to MD

## 2013-12-28 ENCOUNTER — Ambulatory Visit: Payer: Medicare Other | Admitting: Rehabilitation

## 2013-12-31 NOTE — Telephone Encounter (Signed)
Printed order that was previously scanned and refaxed to [facility] at [phone].  Notified pt and gave him their phone # [phone] to contact re: status if doesn't hear anything within 1 week. Pt voices understanding.

## 2014-01-08 ENCOUNTER — Telehealth: Payer: Self-pay | Admitting: Family Medicine

## 2014-01-08 NOTE — Telephone Encounter (Signed)
OK to refill Gabapentin sa,e strength, same sig, #270 1 rf

## 2014-01-08 NOTE — Telephone Encounter (Signed)
refill-gabapentin 300mg 

## 2014-01-08 NOTE — Telephone Encounter (Signed)
Please advise Gabapentin refill? Last RX was done on 09-14-13 quantity 270 X 2 refills

## 2014-01-09 ENCOUNTER — Telehealth: Payer: Self-pay | Admitting: Family Medicine

## 2014-01-09 MED ORDER — GABAPENTIN 300 MG PO CAPS: ORAL_CAPSULE | ORAL | Status: DC

## 2014-01-09 NOTE — Telephone Encounter (Signed)
Refill sent. Notified pt's wife. 

## 2014-01-09 NOTE — Telephone Encounter (Signed)
See 01/09/14 refill note.

## 2014-01-09 NOTE — Telephone Encounter (Signed)
Patient has changed his pharmacy to [facility] in [place].  He needs a refill of gabapentin.  He will be out tonight.

## 2014-02-11 ENCOUNTER — Ambulatory Visit (INDEPENDENT_AMBULATORY_CARE_PROVIDER_SITE_OTHER): Payer: Medicare Other | Admitting: Family Medicine

## 2014-02-11 ENCOUNTER — Encounter: Payer: Self-pay | Admitting: Family Medicine

## 2014-02-11 VITALS — BP 130/90 | HR 67 | Temp 97.8°F | Resp 18 | Ht 70.5 in | Wt 230.0 lb

## 2014-02-11 DIAGNOSIS — D1739 Benign lipomatous neoplasm of skin and subcutaneous tissue of other sites: Secondary | ICD-10-CM | POA: Diagnosis not present

## 2014-02-11 DIAGNOSIS — B0229 Other postherpetic nervous system involvement: Secondary | ICD-10-CM | POA: Diagnosis not present

## 2014-02-11 DIAGNOSIS — IMO0001 Reserved for inherently not codable concepts without codable children: Secondary | ICD-10-CM

## 2014-02-11 DIAGNOSIS — R030 Elevated blood-pressure reading, without diagnosis of hypertension: Secondary | ICD-10-CM

## 2014-02-11 DIAGNOSIS — G4730 Sleep apnea, unspecified: Secondary | ICD-10-CM

## 2014-02-11 DIAGNOSIS — D179 Benign lipomatous neoplasm, unspecified: Secondary | ICD-10-CM

## 2014-02-11 NOTE — Telephone Encounter (Signed)
Patient informed per MD that they need to call [facility] again or she can refer to Dr Elsworth Soho. Pt and his wife voiced understanding and stated they would try to call [facility] again

## 2014-02-11 NOTE — Patient Instructions (Signed)
Capsaicin ointment every day   Lipoma A lipoma is a noncancerous (benign) tumor composed of fat cells. They are usually found under the skin (subcutaneous). A lipoma may occur in any tissue of the body that contains fat. Common areas for lipomas to appear include the back, shoulders, buttocks, and thighs. Lipomas are a very common soft tissue growth. They are soft and grow slowly. Most problems caused by a lipoma depend on where it is growing. DIAGNOSIS  A lipoma can be diagnosed with a physical exam. These tumors rarely become cancerous, but radiographic studies can help determine this for certain. Studies used may include:  Computerized X-ray scans (CT or CAT scan).  Computerized magnetic scans (MRI). TREATMENT  Small lipomas that are not causing problems may be watched. If a lipoma continues to enlarge or causes problems, removal is often the best treatment. Lipomas can also be removed to improve appearance. Surgery is done to remove the fatty cells and the surrounding capsule. Most often, this is done with medicine that numbs the area (local anesthetic). The removed tissue is examined under a microscope to make sure it is not cancerous. Keep all follow-up appointments with your caregiver. SEEK MEDICAL CARE IF:   The lipoma becomes larger or hard.  The lipoma becomes painful, red, or increasingly swollen. These could be signs of infection or a more serious condition. Document Released: 12/03/2002 Document Revised: 03/06/2012 Document Reviewed: 05/15/2010 [facility] Patient Information 2014 [place], [place].

## 2014-02-11 NOTE — Progress Notes (Signed)
Pre visit review using our clinic review tool, if applicable. No additional management support is needed unless otherwise documented below in the visit note. 

## 2014-02-13 ENCOUNTER — Telehealth: Payer: Self-pay

## 2014-02-13 ENCOUNTER — Encounter: Payer: Self-pay | Admitting: Family Medicine

## 2014-02-13 LAB — RENAL FUNCTION PANEL
Albumin: 4.2 g/dL (ref 3.5–5.2)
BUN: 16 mg/dL (ref 6–23)
CHLORIDE: 105 meq/L (ref 96–112)
CO2: 28 mEq/L (ref 19–32)
CREATININE: 0.9 mg/dL (ref 0.50–1.35)
Calcium: 8.9 mg/dL (ref 8.4–10.5)
GLUCOSE: 89 mg/dL (ref 70–99)
Phosphorus: 2.7 mg/dL (ref 2.3–4.6)
Potassium: 4.5 mEq/L (ref 3.5–5.3)
Sodium: 138 mEq/L (ref 135–145)

## 2014-02-13 LAB — CBC
HEMATOCRIT: 43.5 % (ref 39.0–52.0)
HEMOGLOBIN: 14.5 g/dL (ref 13.0–17.0)
MCH: 29.7 pg (ref 26.0–34.0)
MCHC: 33.3 g/dL (ref 30.0–36.0)
MCV: 89.1 fL (ref 78.0–100.0)
Platelets: 220 10*3/uL (ref 150–400)
RBC: 4.88 MIL/uL (ref 4.22–5.81)
RDW: 14.6 % (ref 11.5–15.5)
WBC: 8.6 10*3/uL (ref 4.0–10.5)

## 2014-02-13 LAB — HEPATIC FUNCTION PANEL
ALT: 23 U/L (ref 0–53)
AST: 20 U/L (ref 0–37)
Albumin: 4.2 g/dL (ref 3.5–5.2)
Alkaline Phosphatase: 54 U/L (ref 39–117)
BILIRUBIN TOTAL: 0.4 mg/dL (ref 0.2–1.2)
Bilirubin, Direct: 0.1 mg/dL (ref 0.0–0.3)
Indirect Bilirubin: 0.3 mg/dL (ref 0.2–1.2)
TOTAL PROTEIN: 6.4 g/dL (ref 6.0–8.3)

## 2014-02-13 NOTE — Assessment & Plan Note (Signed)
Slowly enlarging and patient with pain upon movement of his right shoulder. Will refer to general surgery for consideration of excision.

## 2014-02-13 NOTE — Assessment & Plan Note (Signed)
Diagnosed via sleep study awaiting CPAP machine from home health.

## 2014-02-13 NOTE — Assessment & Plan Note (Signed)
Adequate control, no changes 

## 2014-02-13 NOTE — Progress Notes (Signed)
Patient ID: [name] [name], male   DOB: 1941/10/24, 73 y.o.   MRN: [id number] [name] [name] [id number] [birth year] 02/13/2014      Progress Note-Follow Up  Subjective  Chief Complaint  Chief Complaint  Patient presents with  . Herpes Zoster    pt is here for f/u. pt is still having pain. pt wants to r/f on lidocaine.    HPI  Patient is a 73 year old Caucasian male who is in today for followup of his life. Continues to struggle with right shoulder pain. Has had a lipoma in the right axilla for quite some time but it is slowly enlarging and is having more pain in his right shoulder as a result. He notes he can have no pain in the right scapula and with certain movements he can go to 10 out of 10 suddenly. He finds gabapentin and lidocaine to be marginally helpful. No paresthesias are noted. No neck pain is noted. No recent illness, fevers, chest pain, palpitations or shortness of breath noted.  Past Medical History  Diagnosis Date  . GERD (gastroesophageal reflux disease)     controlled w/ diet and behavioral changes  . BPH (benign prostatic hypertrophy)   . Elevated PSA   . White coat hypertension   . Shingles 07/09/2013  . Encounter for Medicare annual wellness exam 09/22/2013    Sees Dr Wilhemina Bonito of Derm Sees Dr Silvano Rusk of Gastroenterology Sees Dr Kathie Rhodes of [facility] Sees Dr Susa Day of Optometry  . Snoring disorder 07/30/2011  . Sleep apnea 07/30/2011    History reviewed. No pertinent past surgical history.  Family History  Problem Relation Age of Onset  . Cancer Mother     MM, leukemia  . Emphysema Father   . Cancer Sister     brain  . Alcohol abuse Other   . COPD Brother   . Heart disease Paternal Grandmother   . COPD Paternal Grandfather   . Diabetes Sister   . Obesity Sister   . Down syndrome Brother   . Colon cancer Neg Hx     History   Social History  . Marital Status: Married    Spouse Name: [name]    Number of Children: 2  . Years of  Education: N/A   Occupational History  . retired     Administrator   Social History Main Topics  . Smoking status: Former Smoker    Quit date: 02/11/1989  . Smokeless tobacco: Never Used  . Alcohol Use: 4.2 oz/week    7 Shots of liquor per week     Comment: 1 per day  . Drug Use: No  . Sexual Activity: Not on file   Other Topics Concern  . Not on file   Social History Narrative  . No narrative on file    Current Outpatient Prescriptions on File Prior to Visit  Medication Sig Dispense Refill  . aspirin 81 MG chewable tablet Chew 81 mg by mouth daily.        Marland Kitchen gabapentin (NEURONTIN) 300 MG capsule Take 3 capsules by mouth three times a day.  270 capsule  2  . lidocaine (XYLOCAINE) 5 % ointment APPLY TOPICALLY AS NEEDED.  35.44 g  0  . Multiple Vitamins-Minerals (MENS MULTIVITAMIN PLUS PO) Take 1 tablet by mouth daily.        . Omega-3 Fatty Acids (FISH OIL PO) Take 1,400 mg by mouth daily.       No current facility-administered medications on file prior  to visit.    No Known Allergies  Review of Systems  Review of Systems  Constitutional: Negative for fever and malaise/fatigue.  HENT: Negative for congestion.   Eyes: Negative for discharge.  Respiratory: Negative for shortness of breath.   Cardiovascular: Negative for chest pain, palpitations and leg swelling.  Gastrointestinal: Negative for nausea, abdominal pain and diarrhea.  Genitourinary: Negative for dysuria.  Musculoskeletal: Positive for joint pain. Negative for falls.       Right shoulder/scapular pain only with certain movements  Skin: Negative for rash.  Neurological: Negative for loss of consciousness and headaches.  Endo/Heme/Allergies: Negative for polydipsia.  Psychiatric/Behavioral: Negative for depression and suicidal ideas. The patient is not nervous/anxious and does not have insomnia.     Objective  BP 130/90  Pulse 67  Temp(Src) 97.8 F (36.6 C) (Oral)  Resp 18  Ht 5' 10.5" (1.791 m)  Wt  230 lb 0.6 oz (104.345 kg)  BMI 32.53 kg/m2  SpO2 97%  Physical Exam  Physical Exam  Constitutional: He is oriented to person, place, and time and well-developed, well-nourished, and in no distress. No distress.  HENT:  Head: Normocephalic and atraumatic.  Eyes: Conjunctivae are normal.  Neck: Neck supple. No thyromegaly present.  Cardiovascular: Normal rate, regular rhythm and normal heart sounds.   No murmur heard. Pulmonary/Chest: Effort normal and breath sounds normal. No respiratory distress.  Abdominal: He exhibits no distension and no mass. There is no tenderness.  Musculoskeletal: He exhibits no edema.  Mass/lpoma right anterior axillae  Neurological: He is alert and oriented to person, place, and time.  Skin: Skin is warm.  Psychiatric: Memory, affect and judgment normal.    Lab Results  Component Value Date   TSH 2.842 09/13/2013   Lab Results  Component Value Date   WBC 7.5 09/13/2013   HGB 14.9 09/13/2013   HCT 43.7 09/13/2013   MCV 88.1 09/13/2013   PLT 208 09/13/2013   Lab Results  Component Value Date   CREATININE 0.90 09/13/2013   BUN 17 09/13/2013   NA 137 09/13/2013   K 4.7 09/13/2013   CL 103 09/13/2013   CO2 28 09/13/2013   Lab Results  Component Value Date   ALT 19 08/03/2012   AST 18 08/03/2012   ALKPHOS 52 08/03/2012   BILITOT 0.6 08/03/2012   Lab Results  Component Value Date   CHOL 157 07/20/2011   Lab Results  Component Value Date   HDL 46 07/20/2011   Lab Results  Component Value Date   LDLCALC 95 07/20/2011   Lab Results  Component Value Date   TRIG 82 07/20/2011   Lab Results  Component Value Date   CHOLHDL 3.4 07/20/2011     Assessment & Plan  LIPOMA, SKIN Slowly enlarging and patient with pain upon movement of his right shoulder. Will refer to general surgery for consideration of excision.   Post herpetic neuralgia Gabapentin and Lidocaine marginally helpful, may try Capsaicin cream as well.   Sleep apnea Diagnosed via sleep  study awaiting CPAP machine from home health.  ELEVATED BP READING WITHOUT DX HYPERTENSION Adequate control, no changes

## 2014-02-13 NOTE — Assessment & Plan Note (Signed)
Gabapentin and Lidocaine marginally helpful, may try Capsaicin cream as well.

## 2014-02-13 NOTE — Telephone Encounter (Signed)
Message copied by Varney Daily on Wed Feb 13, 2014  2:47 PM ------      Message from: Aviva Signs      Created: Wed Feb 13, 2014  2:29 PM       Pt is scheduled for Feb 27th arrive at 1.15pm for a 1.45pm apt time with Dr Michael Boston....Thayer Headings      ----- Message -----         From: Varney Daily, RMA         Sent: 02/13/2014   1:53 PM           To: Aviva Signs                        ----- Message -----         From: Mosie Lukes, MD         Sent: 02/13/2014   1:22 PM           To: Varney Daily, RMA            So the note now specifies right axillae, please forward and also what did he say about the cpap, does he want referral to pulmonary or through home health?      ----- Message -----         From: Varney Daily, RMA         Sent: 02/13/2014  11:51 AM           To: Mosie Lukes, MD            Please let Aviva Signs know where the lipoma is located.       ----- Message -----         From: Aviva Signs         Sent: 02/13/2014  11:09 AM           To: Varney Daily, RMA            Tareq Dwan,where is the lipoma located.Marland KitchenMarland KitchenThayer Headings                   ------

## 2014-02-21 ENCOUNTER — Ambulatory Visit (INDEPENDENT_AMBULATORY_CARE_PROVIDER_SITE_OTHER): Payer: Medicare Other | Admitting: Surgery

## 2014-02-22 ENCOUNTER — Encounter (INDEPENDENT_AMBULATORY_CARE_PROVIDER_SITE_OTHER): Payer: Self-pay | Admitting: Surgery

## 2014-03-02 DIAGNOSIS — J209 Acute bronchitis, unspecified: Secondary | ICD-10-CM | POA: Diagnosis not present

## 2014-03-08 ENCOUNTER — Encounter (INDEPENDENT_AMBULATORY_CARE_PROVIDER_SITE_OTHER): Payer: Self-pay | Admitting: Surgery

## 2014-03-08 ENCOUNTER — Ambulatory Visit (INDEPENDENT_AMBULATORY_CARE_PROVIDER_SITE_OTHER): Payer: Medicare Other | Admitting: Surgery

## 2014-03-08 VITALS — BP 128/78 | HR 76 | Temp 98.4°F | Ht 70.0 in | Wt 234.0 lb

## 2014-03-08 DIAGNOSIS — R221 Localized swelling, mass and lump, neck: Secondary | ICD-10-CM | POA: Insufficient documentation

## 2014-03-08 DIAGNOSIS — R220 Localized swelling, mass and lump, head: Secondary | ICD-10-CM

## 2014-03-08 DIAGNOSIS — R222 Localized swelling, mass and lump, trunk: Secondary | ICD-10-CM

## 2014-03-08 NOTE — Patient Instructions (Signed)
Please consider the recommendations that we have given you today:  Consider removal of masses around your right chest, axilla/ armpit, and possibly posterior neck as well.  See the Handout(s) we have given you.  Please call our office at [phone] if you wish to schedule surgery or if you have further questions / concerns.   Lipoma A lipoma is a noncancerous (benign) tumor composed of fat cells. They are usually found under the skin (subcutaneous). A lipoma may occur in any tissue of the body that contains fat. Common areas for lipomas to appear include the back, shoulders, buttocks, and thighs. Lipomas are a very common soft tissue growth. They are soft and grow slowly. Most problems caused by a lipoma depend on where it is growing. DIAGNOSIS  A lipoma can be diagnosed with a physical exam. These tumors rarely become cancerous, but radiographic studies can help determine this for certain. Studies used may include:  Computerized X-ray scans (CT or CAT scan).  Computerized magnetic scans (MRI). TREATMENT  Small lipomas that are not causing problems may be watched. If a lipoma continues to enlarge or causes problems, removal is often the best treatment. Lipomas can also be removed to improve appearance. Surgery is done to remove the fatty cells and the surrounding capsule. Most often, this is done with medicine that numbs the area (local anesthetic). The removed tissue is examined under a microscope to make sure it is not cancerous. Keep all follow-up appointments with your caregiver. SEEK MEDICAL CARE IF:   The lipoma becomes larger or hard.  The lipoma becomes painful, red, or increasingly swollen. These could be signs of infection or a more serious condition. Document Released: 12/03/2002 Document Revised: 03/06/2012 Document Reviewed: 05/15/2010 [facility] Patient Information 2014 [place], [place].  DRAIN CARE:   You have a closed bulb drain to help you heal.  A bulb drain is a  small, plastic reservoir which creates a gentle suction. It is used to remove excess fluid from a surgical wound. The color and amount of fluid will vary. Immediately after surgery, the fluid is bright red. It may gradually change to a yellow color. When the amount decreases to about 1 or 2 tablespoons (15 to 30 cc) per 24 hours, your caregiver will usually remove it.  DAILY CARE  Keep the bulb compressed at all times, except while emptying it. The compression creates suction.   Keep sites where the tubes enter the skin dry and covered with a light bandage (dressing).   Tape the tubes to your skin, 1 to 2 inches below the insertion sites, to keep from pulling on your stitches. Tubes are stitched in place and will not slip out.   Pin the bulb to your shirt (not to your pants) with a safety pin.   For the first few days after surgery, there usually is more fluid in the bulb. Empty the bulb whenever it becomes half full because the bulb does not create enough suction if it is too full. Include this amount in your 24 hour totals.   When the amount of drainage decreases, empty the bulb at the same time every day. Write down the amounts and the 24 hour totals. Your caregiver will want to know them. This helps your caregiver know when the tubes can be removed.   (We anticipate removing the drain in 1-3 weeks, depending on when the output is <44mL a day for 2+ days)  If there is drainage around the tube sites, change dressings and  keep the area dry. If you see a clot in the tube, leave it alone. However, if the tube does not appear to be draining, let your caregiver know.  TO EMPTY THE BULB  Open the stopper to release suction.   Holding the stopper out of the way, pour drainage into the measuring cup that was sent home with you.   Measure and write down the amount. If there are 2 bulbs, note the amount of drainage from bulb 1 or bulb 2 and keep the totals separate. Your caregiver will want to know  which tube is draining more.   Compress the bulb by folding it in half.   Replace the stopper.   Check the tape that holds the tube to your skin, and pin the bulb to your shirt.  SEEK MEDICAL CARE IF:  The drainage develops a bad odor.   You have an oral temperature above 102 F (38.9 C).   The amount of drainage from your wound suddenly increases or decreases.   You accidentally pull out your drain.   You have any other questions or concerns.  MAKE SURE YOU:   Understand these instructions.   Will watch your condition.   Will get help right away if you are not doing well or get worse.     Call our office if you have any questions about your drain. [phone]  GENERAL SURGERY: POST OP INSTRUCTIONS  1. DIET: Follow a light bland diet the first 24 hours after arrival home, such as soup, liquids, crackers, etc.  Be sure to include lots of fluids daily.  Avoid fast food or heavy meals as your are more likely to get nauseated.   2. Take your usually prescribed home medications unless otherwise directed. 3. PAIN CONTROL: a. Pain is best controlled by a usual combination of three different methods TOGETHER: i. Ice/Heat ii. Over the counter pain medication iii. Prescription pain medication b. Most patients will experience some swelling and bruising around the incisions.  Ice packs or heating pads (30-60 minutes up to 6 times a day) will help. Use ice for the first few days to help decrease swelling and bruising, then switch to heat to help relax tight/sore spots and speed recovery.  Some people prefer to use ice alone, heat alone, alternating between ice & heat.  Experiment to what works for you.  Swelling and bruising can take several weeks to resolve.   c. It is helpful to take an over-the-counter pain medication regularly for the first few weeks.  Choose one of the following that works best for you: i. Naproxen (Aleve, etc)  Two 220mg  tabs twice a day ii. Ibuprofen (Advil,  etc) Three 200mg  tabs four times a day (every meal & bedtime) iii. Acetaminophen (Tylenol, etc) 500-650mg  four times a day (every meal & bedtime) d. A  prescription for pain medication (such as oxycodone, hydrocodone, etc) should be given to you upon discharge.  Take your pain medication as prescribed.  i. If you are having problems/concerns with the prescription medicine (does not control pain, nausea, vomiting, rash, itching, etc), please call us [phone] to see if we need to switch you to a different pain medicine that will work better for you and/or control your side effect better. ii. If you need a refill on your pain medication, please contact your pharmacy.  They will contact our office to request authorization. Prescriptions will not be filled after 5 pm or on week-ends. 4. Avoid getting constipated.  Between  the surgery and the pain medications, it is common to experience some constipation.  Increasing fluid intake and taking a fiber supplement (such as Metamucil, Citrucel, FiberCon, MiraLax, etc) 1-2 times a day regularly will usually help prevent this problem from occurring.  A mild laxative (prune juice, Milk of Magnesia, MiraLax, etc) should be taken according to package directions if there are no bowel movements after 48 hours.   5. Wash / shower every day.  You may shower over the dressings as they are waterproof.  Continue to shower over incision(s) after the dressing is off. 6. Remove your waterproof bandages 5 days after surgery.  You may leave the incision open to air.  You may have skin tapes (Steri Strips) covering the incision(s).  Leave them on until one week, then remove.  You may replace a dressing/Band-Aid to cover the incision for comfort if you wish.      7. ACTIVITIES as tolerated:   a. You may resume regular (light) daily activities beginning the next day-such as daily self-care, walking, climbing stairs-gradually increasing activities as tolerated.  If you can walk  30 minutes without difficulty, it is safe to try more intense activity such as jogging, treadmill, bicycling, low-impact aerobics, swimming, etc. b. Save the most intensive and strenuous activity for last such as sit-ups, heavy lifting, contact sports, etc  Refrain from any heavy lifting or straining until you are off narcotics for pain control.   c. DO NOT PUSH THROUGH PAIN.  Let pain be your guide: If it hurts to do something, don't do it.  Pain is your body warning you to avoid that activity for another week until the pain goes down. d. You may drive when you are no longer taking prescription pain medication, you can comfortably wear a seatbelt, and you can safely maneuver your car and apply brakes. e. Dennis Bast may have sexual intercourse when it is comfortable.  8. FOLLOW UP in our office a. Please call [facility] at [phone] to set up an appointment to see your surgeon in the office for a follow-up appointment approximately 2-3 weeks after your surgery. b. Make sure that you call for this appointment the day you arrive home to insure a convenient appointment time. 9. IF YOU HAVE DISABILITY OR FAMILY LEAVE FORMS, BRING THEM TO THE OFFICE FOR PROCESSING.  DO NOT GIVE THEM TO YOUR DOCTOR.   WHEN TO CALL us [phone]: 1. Poor pain control 2. Reactions / problems with new medications (rash/itching, nausea, etc)  3. Fever over 101.5 F (38.5 C) 4. Worsening swelling or bruising 5. Continued bleeding from incision. 6. Increased pain, redness, or drainage from the incision 7. Difficulty breathing / swallowing   The clinic staff is available to answer your questions during regular business hours (8:30am-5pm).  Please don't hesitate to call and ask to speak to one of our nurses for clinical concerns.   If you have a medical emergency, go to the nearest emergency room or call 911.  A surgeon from [facility] is always on call at the [facility], [place], [place], [place], [place]  [address] ? MAIN: [phone] ? TOLL FREE: [phone] ?  FAX [phone] [email or web address]

## 2014-03-08 NOTE — Progress Notes (Signed)
Subjective:     Patient ID: [name] [name], male   DOB: [birth year], 73 y.o.   MRN: [id number]  HPI  Note: This dictation was prepared with Dragon/digital dictation along with [facility] technology. Any transcriptional errors that result from this process are unintentional.       [name] [name]  1941/01/23 [id number]  Patient Care Team: Mosie Lukes, MD as PCP - General (Family Medicine) Gatha Mayer, MD as Consulting Physician (Gastroenterology) Bonnita Levan, MD as Consulting Physician (Dermatology)  This patient is a 73 y.o.male who presents today for surgical evaluation at the request of Dr. Charlett Blake.   Reason for visit: Right shoulder/axillary pain with enlarging lipomas.  Pleasant male.  Comes in with his wife.  History of melanoma on back excised by his dermatologist.  History of shingles with postherpetic neuralgia type pains going from right anterior chest wall to back.  Also has noted lumps around his armpit and shoulder.  Question of shoulder discomfort.  He has had persistent symptoms with the masses.  Therefore, surgical consultation requested to see if removal of them.  The patient thinks they may have gotten a little larger but not dramatically.  His never gotten very inflamed.  No drainage.  No blood or pus.  MRI had been done of the region.  Patient Active Problem List   Diagnosis Date Noted  . Encounter for Medicare annual wellness exam 09/22/2013  . Axillary mass 08/21/2013  . Post herpetic neuralgia 07/09/2013  . Blepharitis of right lower eyelid 08/03/2012  . Sleep apnea 07/30/2011  . ELEVATED PROSTATE SPECIFIC ANTIGEN 01/13/2010  . LIPOMA, SKIN 08/29/2008  . GERD 08/29/2008  . BENIGN PROSTATIC HYPERTROPHY 08/29/2008  . SHOULDER PAIN, LEFT 08/29/2008  . ELEVATED BP READING WITHOUT DX HYPERTENSION 08/29/2008    Past Medical History  Diagnosis Date  . GERD (gastroesophageal reflux disease)     controlled w/ diet and behavioral changes  . BPH (benign  prostatic hypertrophy)   . Elevated PSA   . White coat hypertension   . Shingles 07/09/2013  . Encounter for Medicare annual wellness exam 09/22/2013    Sees Dr Wilhemina Bonito of Derm Sees Dr Silvano Rusk of Gastroenterology Sees Dr Kathie Rhodes of [facility] Sees Dr Susa Day of Optometry  . Snoring disorder 07/30/2011  . Sleep apnea 07/30/2011    History reviewed. No pertinent past surgical history.  History   Social History  . Marital Status: Married    Spouse Name: [name] [name]    Number of Children: 2  . Years of Education: N/A   Occupational History  . retired     Administrator   Social History Main Topics  . Smoking status: Former Smoker    Quit date: 02/11/1989  . Smokeless tobacco: Never Used  . Alcohol Use: 4.2 oz/week    7 Shots of liquor per week     Comment: 1 per day  . Drug Use: No  . Sexual Activity: Not on file   Other Topics Concern  . Not on file   Social History Narrative  . No narrative on file    Family History  Problem Relation Age of Onset  . Cancer Mother     MM, leukemia  . Emphysema Father   . Cancer Sister     brain  . Alcohol abuse Other   . COPD Brother   . Heart disease Paternal Grandmother   . COPD Paternal Grandfather   . Diabetes Sister   . Obesity Sister   .  Down syndrome Brother   . Colon cancer Neg Hx     Current Outpatient Prescriptions  Medication Sig Dispense Refill  . ascorbic acid (VITAMIN C) 1000 MG tablet Take 1,000 mg by mouth daily.      Marland Kitchen aspirin 81 MG chewable tablet Chew 81 mg by mouth daily.        . chlorpheniramine-HYDROcodone (TUSSIONEX) 10-8 MG/5ML LQCR       . doxycycline (VIBRA-TABS) 100 MG tablet       . gabapentin (NEURONTIN) 300 MG capsule Take 3 capsules by mouth three times a day.  270 capsule  2  . lidocaine (XYLOCAINE) 5 % ointment APPLY TOPICALLY AS NEEDED.  35.44 g  0  . Multiple Vitamins-Minerals (MENS MULTIVITAMIN PLUS PO) Take 1 tablet by mouth daily.        . Omega-3 Fatty Acids (FISH  OIL PO) Take 1,400 mg by mouth daily.      . vitamin C (ASCORBIC ACID) 500 MG tablet Take 500 mg by mouth Nightly.        No current facility-administered medications for this visit.     No Known Allergies  BP 128/78  Pulse 76  Temp(Src) 98.4 F (36.9 C) (Oral)  Ht 5\' 10"  (1.778 m)  Wt 234 lb (106.142 kg)  BMI 33.58 kg/m2  No results found.   Review of Systems  Constitutional: Negative for fever, chills and diaphoresis.  HENT: Negative for ear discharge, facial swelling, mouth sores, nosebleeds, sore throat and trouble swallowing.   Eyes: Negative for photophobia, discharge and visual disturbance.  Respiratory: Negative for choking, chest tightness, shortness of breath and stridor.   Cardiovascular: Negative for chest pain and palpitations.  Gastrointestinal: Negative for nausea, vomiting, abdominal pain, diarrhea, constipation, blood in stool, abdominal distention, anal bleeding and rectal pain.  Endocrine: Negative for cold intolerance and heat intolerance.  Genitourinary: Negative for dysuria, urgency, difficulty urinating and testicular pain.  Musculoskeletal: Negative for arthralgias, back pain, gait problem and myalgias.  Skin: Negative for color change, pallor, rash and wound.  Allergic/Immunologic: Negative for environmental allergies and food allergies.  Neurological: Negative for dizziness, speech difficulty, weakness, numbness and headaches.  Hematological: Negative for adenopathy. Does not bruise/bleed easily.  Psychiatric/Behavioral: Negative for hallucinations, confusion and agitation.       Objective:   Physical Exam  Constitutional: He is oriented to person, place, and time. He appears well-developed and well-nourished. No distress.  HENT:  Head: Normocephalic.  Mouth/Throat: Oropharynx is clear and moist. No oropharyngeal exudate.  Eyes: Conjunctivae and EOM are normal. Pupils are equal, round, and reactive to light. No scleral icterus.  Neck: Normal range  of motion. Neck supple. No tracheal deviation present.  Cardiovascular: Normal rate, regular rhythm and intact distal pulses.   Pulmonary/Chest: Effort normal and breath sounds normal. No respiratory distress.      Abdominal: Soft. He exhibits no distension. There is no tenderness. Hernia confirmed negative in the right inguinal area and confirmed negative in the left inguinal area.  Musculoskeletal: Normal range of motion. He exhibits no tenderness.  Lymphadenopathy:    He has no cervical adenopathy.       Right: No inguinal adenopathy present.       Left: No inguinal adenopathy present.  Neurological: He is alert and oriented to person, place, and time. No cranial nerve deficit. He exhibits normal muscle tone. Coordination normal.  Skin: Skin is warm and dry. No rash noted. He is not diaphoretic. No erythema. No pallor.  Psychiatric: He  has a normal mood and affect. His behavior is normal. Judgment and thought content normal.   *RADIOLOGY REPORT*  Clinical Data: Right axillary mass. History melanoma and shingles.  MRI CHEST WITHOUT AND WITH CONTRAST  Technique: Multiplanar multisequence MR imaging of the chest was  performed both before and after administration of intravenous  contrast.  Contrast: 20 ml Multihance IV.  Comparison: None.  Findings: There is a mass in the right chest wall measuring 12.5 cm  cranial-caudal by 11.7 cm AP by 5.1 cm transverse. The lesion is  immediately contiguous the ribs and deep to the serratus anterior.  The lesion demonstrates T1 hyperintensity with a few fine  septations present. There is complete signal dropout on fat  saturation sequences and there is no contrast enhancement. No  other mass is identified.  Although not designed to evaluate intrinsic structures of the  shoulder, there is mild to moderate acromioclavicular degenerative  change. No rotator cuff tear is identified. There is a small  amount of fluid and enhancement in the  subacromial/subdeltoid bursa  compatible with bursitis. No focal marrow lesion is identified.  IMPRESSION:  1. Lesion along the right chest wall has an appearance consistent  with a benign lipoma.  2. 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  Original Report Authenticated By: Orlean Patten, M.D.     Assessment:     Large right axillary/chest wall paresthesias with masses In a patient with an ipsilateral mid back melanoma removed.  Subfascial large fatty mass on right chest wall most likely lipoma.  Smaller right anterior subcutaneous mass overlying right pectoral region.    Smaller posterior neck mass asymptomatic.     Plan:     Obtain records of excision of a melanoma on the right posterior back.  Does not sound like a sentinel lymph node was done, so that probably means it was very early.  However ipsilateral masses near axilla concerning.  I reviewed the MRI findings with the patient and his wife.  Discussed with my partner, Dr. Georgette Dover.  Because they are of significant size and he has symptoms in that region, I think it is reasonable to remove the subfascial and subcutaneous masses around the right chest wall/axilla. I offerred to do remove the posterior neck mass as well to avoid getting larger.    I did caution it may not solve all his problems and he may still struggling, but is reasonable to remove them and see if there would be benefit.  He and his wife wish to be aggressive and remove at least the chest wall/axillary masses.  The patient is leaning towards removing the posterior neck mass at the same time as well.  I did discuss the procedure with him.  He will definitely need a drain for the deeper level.  We will see if perhaps he would benefit from an On-Q subcutaneous bupivacaine pump to help nerve block the area and minimize any postoperative nerve pain flares.  At least 2 aggressive local/regional block around that time as well.   (?Anesthesia is interested axillary block?)  The pathophysiology of skin & subcutaneous masses was discussed.  Natural history risks without surgery were discussed.  I recommended surgery to remove the mass.  I explained the technique of removal with use of local anesthesia & possible need for more aggressive sedation/anesthesia for patient comfort.    Risks such as bleeding, infection, heart attack, death, and other risks were discussed.  I noted a good likelihood this will  help address the problem.   Possibility that this will not correct all symptoms was explained. Possibility of regrowth/recurrence of the mass was discussed.  Possibility of paresthesias or worsening pain due to nerve strain/injury discussed as well.  We will work to minimize complications. Questions were answered.  The patient expresses understanding & wishes to proceed with surgery.

## 2014-03-18 ENCOUNTER — Telehealth: Payer: Self-pay

## 2014-03-18 ENCOUNTER — Other Ambulatory Visit: Payer: Self-pay | Admitting: Family Medicine

## 2014-03-18 DIAGNOSIS — G4730 Sleep apnea, unspecified: Secondary | ICD-10-CM

## 2014-03-18 NOTE — Telephone Encounter (Signed)
Patient left a message stating that he is waiting on his cpap machine and he had just got off the phone with [facility] and they stated they are waiting on the paperwork to be signed by MD?  Have you seen any paperwork from [place]?

## 2014-03-19 ENCOUNTER — Other Ambulatory Visit: Payer: Self-pay | Admitting: Family Medicine

## 2014-03-19 DIAGNOSIS — G4730 Sleep apnea, unspecified: Secondary | ICD-10-CM

## 2014-03-19 NOTE — Telephone Encounter (Signed)
Order faxed to [facility]

## 2014-03-19 NOTE — Telephone Encounter (Signed)
I do not see a order for CPAP, or have I received any paperwork.

## 2014-03-19 NOTE — Telephone Encounter (Signed)
So it is an order look under that tab. I have also put in a referral as well but the order has the specifics on them

## 2014-04-02 ENCOUNTER — Telehealth: Payer: Self-pay | Admitting: *Deleted

## 2014-04-02 NOTE — Telephone Encounter (Signed)
Received fax from [employer] for CPAP machine and supplies as well as an Lawyer. Both signed and faxed back to [phone] on 04/01/14.

## 2014-04-10 ENCOUNTER — Telehealth: Payer: Self-pay | Admitting: Family Medicine

## 2014-04-10 MED ORDER — GABAPENTIN 300 MG PO CAPS: ORAL_CAPSULE | ORAL | Status: DC

## 2014-04-10 NOTE — Telephone Encounter (Signed)
Refill gabapentin 

## 2014-04-16 DIAGNOSIS — N401 Enlarged prostate with lower urinary tract symptoms: Secondary | ICD-10-CM | POA: Diagnosis not present

## 2014-04-16 DIAGNOSIS — R972 Elevated prostate specific antigen [PSA]: Secondary | ICD-10-CM | POA: Diagnosis not present

## 2014-04-16 DIAGNOSIS — N139 Obstructive and reflux uropathy, unspecified: Secondary | ICD-10-CM | POA: Diagnosis not present

## 2014-04-18 DIAGNOSIS — R972 Elevated prostate specific antigen [PSA]: Secondary | ICD-10-CM | POA: Diagnosis not present

## 2014-04-30 DIAGNOSIS — L570 Actinic keratosis: Secondary | ICD-10-CM | POA: Diagnosis not present

## 2014-04-30 DIAGNOSIS — L719 Rosacea, unspecified: Secondary | ICD-10-CM | POA: Diagnosis not present

## 2014-04-30 DIAGNOSIS — Z85820 Personal history of malignant melanoma of skin: Secondary | ICD-10-CM | POA: Diagnosis not present

## 2014-05-06 ENCOUNTER — Encounter (HOSPITAL_COMMUNITY): Payer: Self-pay | Admitting: Pharmacy Technician

## 2014-05-07 NOTE — Patient Instructions (Signed)
Laquincy Eastridge  05/07/2014   Your procedure is scheduled on:  05/16/14  1100am-100pm  Report to [facility] at    0900 AM.  Call this number if you have problems the morning of surgery: [phone]   Remember:   Do not eat food or drink liquids after midnight.   Take these medicines the morning of surgery with A SIP OF WATER:    Do not wear jewelry,   Do not wear lotions, powders, or perfumes.  . Men may shave face and neck.  Do not bring valuables to the hospital.  Contacts, dentures or bridgework may not be worn into surgery.       Patients discharged the day of surgery will not be allowed to drive  home.  Name and phone number of your driver:    [facility] - Preparing for Surgery Before surgery, you can play an important role.  Because skin is not sterile, your skin needs to be as free of germs as possible.  You can reduce the number of germs on your skin by washing with CHG (chlorahexidine gluconate) soap before surgery.  CHG is an antiseptic cleaner which kills germs and bonds with the skin to continue killing germs even after washing. Please DO NOT use if you have an allergy to CHG or antibacterial soaps.  If your skin becomes reddened/irritated stop using the CHG and inform your nurse when you arrive at Short Stay. Do not shave (including legs and underarms) for at least 48 hours prior to the first CHG shower.  You may shave your face. Please follow these instructions carefully:  1.  Shower with CHG Soap the night before surgery and the  morning of Surgery.  2.  If you choose to wash your hair, wash your hair first as usual with your  normal  shampoo.  3.  After you shampoo, rinse your hair and body thoroughly to remove the  shampoo.                           4.  Use CHG as you would any other liquid soap.  You can apply chg directly  to the skin and wash                       Gently with a scrungie or clean washcloth.  5.  Apply the CHG Soap to your body ONLY FROM  THE NECK DOWN.   Do not use on open                           Wound or open sores. Avoid contact with eyes, ears mouth and genitals (private parts).                        Genitals (private parts) with your normal soap.             6.  Wash thoroughly, paying special attention to the area where your surgery  will be performed.  7.  Thoroughly rinse your body with warm water from the neck down.  8.  DO NOT shower/wash with your normal soap after using and rinsing off  the CHG Soap.                9.  Pat yourself dry with a clean towel.  10.  Wear clean pajamas.            11.  Place clean sheets on your bed the night of your first shower and do not  sleep with pets. Day of Surgery : Do not apply any lotions/deodorants the morning of surgery.  Please wear clean clothes to the hospital/surgery center.  FAILURE TO FOLLOW THESE INSTRUCTIONS MAY RESULT IN THE CANCELLATION OF YOUR SURGERY PATIENT SIGNATURE_________________________________  NURSE SIGNATURE__________________________________  ________________________________________________________________________

## 2014-05-08 ENCOUNTER — Encounter (HOSPITAL_COMMUNITY): Payer: Self-pay

## 2014-05-08 ENCOUNTER — Encounter (HOSPITAL_COMMUNITY)
Admission: RE | Admit: 2014-05-08 | Discharge: 2014-05-08 | Disposition: A | Discharge status: Home / Self Care | Payer: Medicare Other | Source: Ambulatory Visit | Attending: Surgery | Admitting: Surgery

## 2014-05-08 ENCOUNTER — Inpatient Hospital Stay (HOSPITAL_COMMUNITY): Admission: RE | Admit: 2014-05-08 | Payer: Medicare Other | Source: Ambulatory Visit

## 2014-05-08 DIAGNOSIS — Z01812 Encounter for preprocedural laboratory examination: Secondary | ICD-10-CM | POA: Diagnosis not present

## 2014-05-08 LAB — CBC
HEMATOCRIT: 39.3 % (ref 39.0–52.0)
Hemoglobin: 13.2 g/dL (ref 13.0–17.0)
MCH: 29.4 pg (ref 26.0–34.0)
MCHC: 33.6 g/dL (ref 30.0–36.0)
MCV: 87.5 fL (ref 78.0–100.0)
Platelets: 220 10*3/uL (ref 150–400)
RBC: 4.49 MIL/uL (ref 4.22–5.81)
RDW: 15.4 % (ref 11.5–15.5)
WBC: 7.5 10*3/uL (ref 4.0–10.5)

## 2014-05-16 ENCOUNTER — Encounter (HOSPITAL_COMMUNITY): Payer: Medicare Other | Admitting: Anesthesiology

## 2014-05-16 ENCOUNTER — Encounter (HOSPITAL_COMMUNITY)
Admission: RE | Disposition: A | Discharge status: Home / Self Care | Payer: Self-pay | Source: Ambulatory Visit | Attending: Surgery

## 2014-05-16 ENCOUNTER — Ambulatory Visit (HOSPITAL_COMMUNITY): Payer: Medicare Other | Admitting: Anesthesiology

## 2014-05-16 ENCOUNTER — Encounter (HOSPITAL_COMMUNITY): Payer: Self-pay | Admitting: *Deleted

## 2014-05-16 ENCOUNTER — Observation Stay (HOSPITAL_COMMUNITY)
Admission: RE | Admit: 2014-05-16 | Discharge: 2014-05-17 | Disposition: A | Discharge status: Home / Self Care | Payer: Medicare Other | Source: Ambulatory Visit | Attending: Surgery | Admitting: Surgery

## 2014-05-16 ENCOUNTER — Ambulatory Visit (HOSPITAL_COMMUNITY): Payer: Medicare Other

## 2014-05-16 DIAGNOSIS — D1739 Benign lipomatous neoplasm of skin and subcutaneous tissue of other sites: Secondary | ICD-10-CM

## 2014-05-16 DIAGNOSIS — Z85820 Personal history of malignant melanoma of skin: Secondary | ICD-10-CM | POA: Insufficient documentation

## 2014-05-16 DIAGNOSIS — R972 Elevated prostate specific antigen [PSA]: Secondary | ICD-10-CM | POA: Diagnosis not present

## 2014-05-16 DIAGNOSIS — R599 Enlarged lymph nodes, unspecified: Secondary | ICD-10-CM | POA: Diagnosis not present

## 2014-05-16 DIAGNOSIS — Z79899 Other long term (current) drug therapy: Secondary | ICD-10-CM | POA: Diagnosis not present

## 2014-05-16 DIAGNOSIS — I10 Essential (primary) hypertension: Secondary | ICD-10-CM | POA: Insufficient documentation

## 2014-05-16 DIAGNOSIS — R222 Localized swelling, mass and lump, trunk: Secondary | ICD-10-CM | POA: Diagnosis not present

## 2014-05-16 DIAGNOSIS — R221 Localized swelling, mass and lump, neck: Secondary | ICD-10-CM | POA: Diagnosis present

## 2014-05-16 DIAGNOSIS — Z87891 Personal history of nicotine dependence: Secondary | ICD-10-CM | POA: Diagnosis not present

## 2014-05-16 DIAGNOSIS — N400 Enlarged prostate without lower urinary tract symptoms: Secondary | ICD-10-CM | POA: Diagnosis not present

## 2014-05-16 DIAGNOSIS — J9819 Other pulmonary collapse: Secondary | ICD-10-CM | POA: Diagnosis not present

## 2014-05-16 DIAGNOSIS — K219 Gastro-esophageal reflux disease without esophagitis: Secondary | ICD-10-CM | POA: Insufficient documentation

## 2014-05-16 DIAGNOSIS — G4730 Sleep apnea, unspecified: Secondary | ICD-10-CM | POA: Insufficient documentation

## 2014-05-16 DIAGNOSIS — D1779 Benign lipomatous neoplasm of other sites: Principal | ICD-10-CM | POA: Insufficient documentation

## 2014-05-16 DIAGNOSIS — R220 Localized swelling, mass and lump, head: Secondary | ICD-10-CM | POA: Insufficient documentation

## 2014-05-16 DIAGNOSIS — D239 Other benign neoplasm of skin, unspecified: Secondary | ICD-10-CM | POA: Insufficient documentation

## 2014-05-16 DIAGNOSIS — D360 Benign neoplasm of lymph nodes: Secondary | ICD-10-CM

## 2014-05-16 HISTORY — PX: MASS EXCISION: SHX2000

## 2014-05-16 SURGERY — EXCISION MASS
Anesthesia: General | Site: Chest

## 2014-05-16 MED ORDER — KCL IN DEXTROSE-NACL 40-5-0.45 MEQ/L-%-% IV SOLN
INTRAVENOUS | Status: DC
  Administered 2014-05-16: 18:00:00 via INTRAVENOUS
  Filled 2014-05-16 (×2): qty 1000

## 2014-05-16 MED ORDER — 0.9 % SODIUM CHLORIDE (POUR BTL) OPTIME
TOPICAL | Status: DC | PRN
  Administered 2014-05-16: 1000 mL

## 2014-05-16 MED ORDER — ACETAMINOPHEN 325 MG PO TABS: 650.0000 mg | ORAL_TABLET | Freq: Four times a day (QID) | ORAL | Status: DC | PRN

## 2014-05-16 MED ORDER — SODIUM CHLORIDE 0.9 % IJ SOLN
3.0000 mL | Freq: Two times a day (BID) | INTRAMUSCULAR | Status: DC
  Administered 2014-05-16: 3 mL via INTRAVENOUS

## 2014-05-16 MED ORDER — DIPHENHYDRAMINE HCL 12.5 MG/5ML PO ELIX: 12.5000 mg | ORAL_SOLUTION | Freq: Four times a day (QID) | ORAL | Status: DC | PRN

## 2014-05-16 MED ORDER — MEPERIDINE HCL 50 MG/ML IJ SOLN: 6.2500 mg | INTRAMUSCULAR | Status: DC | PRN

## 2014-05-16 MED ORDER — FENTANYL CITRATE 0.05 MG/ML IJ SOLN
INTRAMUSCULAR | Status: AC
  Filled 2014-05-16: qty 2

## 2014-05-16 MED ORDER — CEFAZOLIN SODIUM-DEXTROSE 2-3 GM-% IV SOLR
INTRAVENOUS | Status: AC
  Filled 2014-05-16: qty 50

## 2014-05-16 MED ORDER — ALUM & MAG HYDROXIDE-SIMETH 200-200-20 MG/5ML PO SUSP
30.0000 mL | Freq: Four times a day (QID) | ORAL | Status: DC | PRN
  Administered 2014-05-17: 30 mL via ORAL
  Filled 2014-05-16: qty 30

## 2014-05-16 MED ORDER — PROPOFOL 10 MG/ML IV BOLUS
INTRAVENOUS | Status: DC | PRN
  Administered 2014-05-16: 150 mg via INTRAVENOUS

## 2014-05-16 MED ORDER — HEPARIN SODIUM (PORCINE) 5000 UNIT/ML IJ SOLN
5000.0000 [IU] | Freq: Three times a day (TID) | INTRAMUSCULAR | Status: DC
  Administered 2014-05-17: 5000 [IU] via SUBCUTANEOUS
  Filled 2014-05-16 (×4): qty 1

## 2014-05-16 MED ORDER — HYDROMORPHONE HCL PF 1 MG/ML IJ SOLN
INTRAMUSCULAR | Status: AC
  Filled 2014-05-16: qty 1

## 2014-05-16 MED ORDER — NAPROXEN 500 MG PO TABS
500.0000 mg | ORAL_TABLET | Freq: Two times a day (BID) | ORAL | Status: DC | PRN
  Filled 2014-05-16: qty 1

## 2014-05-16 MED ORDER — POLYETHYLENE GLYCOL 3350 17 G PO PACK
17.0000 g | PACK | Freq: Two times a day (BID) | ORAL | Status: DC | PRN
  Filled 2014-05-16: qty 1

## 2014-05-16 MED ORDER — SUCCINYLCHOLINE CHLORIDE 20 MG/ML IJ SOLN
INTRAMUSCULAR | Status: DC | PRN
  Administered 2014-05-16: 100 mg via INTRAVENOUS

## 2014-05-16 MED ORDER — ATROPINE SULFATE 0.4 MG/ML IJ SOLN
INTRAMUSCULAR | Status: DC | PRN
  Administered 2014-05-16: 0.4 mg via INTRAVENOUS

## 2014-05-16 MED ORDER — BUPIVACAINE-EPINEPHRINE 0.25% -1:200000 IJ SOLN
INTRAMUSCULAR | Status: DC | PRN
Start: 2014-05-16 — End: 2014-05-16
  Administered 2014-05-16: 80 mL

## 2014-05-16 MED ORDER — ONDANSETRON HCL 4 MG/2ML IJ SOLN
INTRAMUSCULAR | Status: AC
  Filled 2014-05-16: qty 2

## 2014-05-16 MED ORDER — PROMETHAZINE HCL 25 MG/ML IJ SOLN: 6.2500 mg | INTRAMUSCULAR | Status: DC | PRN

## 2014-05-16 MED ORDER — PROPOFOL 10 MG/ML IV BOLUS
INTRAVENOUS | Status: AC
  Filled 2014-05-16: qty 20

## 2014-05-16 MED ORDER — GABAPENTIN 300 MG PO CAPS
300.0000 mg | ORAL_CAPSULE | Freq: Three times a day (TID) | ORAL | Status: DC
  Administered 2014-05-16 – 2014-05-17 (×2): 300 mg via ORAL
  Filled 2014-05-16 (×5): qty 1

## 2014-05-16 MED ORDER — ONDANSETRON HCL 4 MG/2ML IJ SOLN: 4.0000 mg | Freq: Four times a day (QID) | INTRAMUSCULAR | Status: DC | PRN

## 2014-05-16 MED ORDER — SODIUM CHLORIDE 0.9 % IV SOLN: 250.0000 mL | INTRAVENOUS | Status: DC | PRN

## 2014-05-16 MED ORDER — OXYCODONE HCL 5 MG PO TABS
5.0000 mg | ORAL_TABLET | ORAL | Status: DC | PRN
  Administered 2014-05-17 (×2): 5 mg via ORAL
  Filled 2014-05-16 (×2): qty 1

## 2014-05-16 MED ORDER — OXYCODONE HCL 5 MG PO TABS: 5.0000 mg | ORAL_TABLET | ORAL | Status: DC | PRN

## 2014-05-16 MED ORDER — BISACODYL 10 MG RE SUPP: 10.0000 mg | Freq: Two times a day (BID) | RECTAL | Status: DC | PRN

## 2014-05-16 MED ORDER — MAGIC MOUTHWASH
15.0000 mL | Freq: Four times a day (QID) | ORAL | Status: DC | PRN
  Filled 2014-05-16: qty 15

## 2014-05-16 MED ORDER — LACTATED RINGERS IV SOLN
INTRAVENOUS | Status: DC
  Administered 2014-05-16: 1000 mL via INTRAVENOUS
  Administered 2014-05-16 (×2): via INTRAVENOUS

## 2014-05-16 MED ORDER — PHENYLEPHRINE 40 MCG/ML (10ML) SYRINGE FOR IV PUSH (FOR BLOOD PRESSURE SUPPORT)
PREFILLED_SYRINGE | INTRAVENOUS | Status: AC
  Filled 2014-05-16: qty 10

## 2014-05-16 MED ORDER — LIP MEDEX EX OINT
1.0000 "application " | TOPICAL_OINTMENT | Freq: Two times a day (BID) | CUTANEOUS | Status: DC
  Administered 2014-05-17: 1 via TOPICAL

## 2014-05-16 MED ORDER — PHENYLEPHRINE HCL 10 MG/ML IJ SOLN
INTRAMUSCULAR | Status: DC | PRN
  Administered 2014-05-16: 60 ug via INTRAVENOUS

## 2014-05-16 MED ORDER — SODIUM CHLORIDE 0.9 % IJ SOLN
INTRAMUSCULAR | Status: AC
Start: 2014-05-16 — End: 2014-05-16
  Filled 2014-05-16: qty 10

## 2014-05-16 MED ORDER — SODIUM CHLORIDE 0.9 % IJ SOLN: 3.0000 mL | INTRAMUSCULAR | Status: DC | PRN

## 2014-05-16 MED ORDER — GLYCOPYRROLATE 0.2 MG/ML IJ SOLN
INTRAMUSCULAR | Status: AC
  Filled 2014-05-16: qty 1

## 2014-05-16 MED ORDER — ACETAMINOPHEN 650 MG RE SUPP: 650.0000 mg | Freq: Four times a day (QID) | RECTAL | Status: DC | PRN

## 2014-05-16 MED ORDER — BUPIVACAINE-EPINEPHRINE (PF) 0.25% -1:200000 IJ SOLN
INTRAMUSCULAR | Status: AC
  Filled 2014-05-16: qty 30

## 2014-05-16 MED ORDER — NAPROXEN 500 MG PO TABS: 500.0000 mg | ORAL_TABLET | Freq: Two times a day (BID) | ORAL | Status: DC

## 2014-05-16 MED ORDER — ASPIRIN 81 MG PO CHEW
81.0000 mg | CHEWABLE_TABLET | ORAL | Status: DC
  Administered 2014-05-17: 81 mg via ORAL
  Filled 2014-05-16: qty 1

## 2014-05-16 MED ORDER — FENTANYL CITRATE 0.05 MG/ML IJ SOLN
INTRAMUSCULAR | Status: DC | PRN
  Administered 2014-05-16: 50 ug via INTRAVENOUS
  Administered 2014-05-16: 100 ug via INTRAVENOUS
  Administered 2014-05-16: 50 ug via INTRAVENOUS

## 2014-05-16 MED ORDER — METOPROLOL TARTRATE 1 MG/ML IV SOLN
5.0000 mg | Freq: Four times a day (QID) | INTRAVENOUS | Status: DC | PRN
  Filled 2014-05-16: qty 5

## 2014-05-16 MED ORDER — GLYCOPYRROLATE 0.2 MG/ML IJ SOLN
INTRAMUSCULAR | Status: DC | PRN
  Administered 2014-05-16: 0.2 mg via INTRAVENOUS

## 2014-05-16 MED ORDER — BUPIVACAINE-EPINEPHRINE 0.25% -1:200000 IJ SOLN
INTRAMUSCULAR | Status: AC
  Filled 2014-05-16: qty 1

## 2014-05-16 MED ORDER — FENTANYL CITRATE 0.05 MG/ML IJ SOLN
25.0000 ug | INTRAMUSCULAR | Status: DC | PRN
  Administered 2014-05-16 (×2): 25 ug via INTRAVENOUS
  Administered 2014-05-16: 50 ug via INTRAVENOUS

## 2014-05-16 MED ORDER — HYDROMORPHONE HCL PF 1 MG/ML IJ SOLN
0.2500 mg | INTRAMUSCULAR | Status: DC | PRN
  Administered 2014-05-16: 0.5 mg via INTRAVENOUS

## 2014-05-16 MED ORDER — DIPHENHYDRAMINE HCL 50 MG/ML IJ SOLN: 12.5000 mg | Freq: Four times a day (QID) | INTRAMUSCULAR | Status: DC | PRN

## 2014-05-16 MED ORDER — CEFAZOLIN SODIUM-DEXTROSE 2-3 GM-% IV SOLR
2.0000 g | INTRAVENOUS | Status: AC
  Administered 2014-05-16: 2 g via INTRAVENOUS

## 2014-05-16 MED ORDER — DEXAMETHASONE SODIUM PHOSPHATE 10 MG/ML IJ SOLN
INTRAMUSCULAR | Status: DC | PRN
  Administered 2014-05-16: 10 mg via INTRAVENOUS

## 2014-05-16 MED ORDER — METOPROLOL TARTRATE 12.5 MG HALF TABLET
12.5000 mg | ORAL_TABLET | Freq: Two times a day (BID) | ORAL | Status: DC | PRN
  Filled 2014-05-16: qty 1

## 2014-05-16 MED ORDER — PROMETHAZINE HCL 25 MG/ML IJ SOLN: 6.2500 mg | Freq: Four times a day (QID) | INTRAMUSCULAR | Status: DC | PRN

## 2014-05-16 MED ORDER — ADULT MULTIVITAMIN W/MINERALS CH
1.0000 | ORAL_TABLET | Freq: Every day | ORAL | Status: DC
  Administered 2014-05-17: 1 via ORAL
  Filled 2014-05-16: qty 1

## 2014-05-16 MED ORDER — FENTANYL CITRATE 0.05 MG/ML IJ SOLN
INTRAMUSCULAR | Status: AC
  Filled 2014-05-16: qty 5

## 2014-05-16 MED ORDER — ATROPINE SULFATE 0.4 MG/ML IJ SOLN
INTRAMUSCULAR | Status: AC
  Filled 2014-05-16: qty 1

## 2014-05-16 MED ORDER — HYDROMORPHONE HCL PF 1 MG/ML IJ SOLN
0.5000 mg | INTRAMUSCULAR | Status: DC | PRN
  Administered 2014-05-16: 1 mg via INTRAVENOUS
  Filled 2014-05-16: qty 1

## 2014-05-16 SURGICAL SUPPLY — 42 items
APPLIER CLIP 11 MED OPEN (CLIP) ×3
BENZOIN TINCTURE PRP APPL 2/3 (GAUZE/BANDAGES/DRESSINGS) IMPLANT
BLADE HEX COATED 2.75 (ELECTRODE) ×3 IMPLANT
CANISTER SUCTION 2500CC (MISCELLANEOUS) ×3 IMPLANT
CLIP APPLIE 11 MED OPEN (CLIP) ×1 IMPLANT
CLOSURE WOUND 1/2 X4 (GAUZE/BANDAGES/DRESSINGS) ×3
DECANTER SPIKE VIAL GLASS SM (MISCELLANEOUS) IMPLANT
DRAIN CHANNEL 19F RND (DRAIN) ×6 IMPLANT
DRAPE LAPAROTOMY T 102X78X121 (DRAPES) IMPLANT
DRAPE LAPAROTOMY TRNSV 102X78 (DRAPE) IMPLANT
DRSG TEGADERM 4X4.75 (GAUZE/BANDAGES/DRESSINGS) ×9 IMPLANT
ELECT CAUTERY BLADE 6.4 (BLADE) ×3 IMPLANT
ELECT REM PT RETURN 9FT ADLT (ELECTROSURGICAL) ×3
ELECTRODE REM PT RTRN 9FT ADLT (ELECTROSURGICAL) ×1 IMPLANT
EVACUATOR SILICONE 100CC (DRAIN) ×6 IMPLANT
GLOVE BIOGEL PI IND STRL 7.0 (GLOVE) ×1 IMPLANT
GLOVE BIOGEL PI INDICATOR 7.0 (GLOVE) ×2
GLOVE ECLIPSE 8.0 STRL XLNG CF (GLOVE) ×3 IMPLANT
GLOVE INDICATOR 8.0 STRL GRN (GLOVE) ×6 IMPLANT
GOWN STRL REUS W/TWL LRG LVL3 (GOWN DISPOSABLE) ×3 IMPLANT
GOWN STRL REUS W/TWL XL LVL3 (GOWN DISPOSABLE) ×6 IMPLANT
KIT BASIN OR (CUSTOM PROCEDURE TRAY) ×3 IMPLANT
NEEDLE HYPO 22GX1.5 SAFETY (NEEDLE) ×6 IMPLANT
NS IRRIG 1000ML POUR BTL (IV SOLUTION) ×3 IMPLANT
PACK GENERAL/GYN (CUSTOM PROCEDURE TRAY) ×3 IMPLANT
PEN SKIN MARKING BROAD (MISCELLANEOUS) IMPLANT
SPONGE GAUZE 4X4 12PLY (GAUZE/BANDAGES/DRESSINGS) ×9 IMPLANT
SPONGE LAP 4X18 X RAY DECT (DISPOSABLE) IMPLANT
STRIP CLOSURE SKIN 1/2X4 (GAUZE/BANDAGES/DRESSINGS) ×6 IMPLANT
SUT BONE WAX W31G (SUTURE) ×3 IMPLANT
SUT ETHILON 2 0 PS N (SUTURE) ×3 IMPLANT
SUT MNCRL AB 4-0 PS2 18 (SUTURE) ×6 IMPLANT
SUT PROLENE 2 0 CT2 30 (SUTURE) ×6 IMPLANT
SUT VIC AB 0 CT1 27 (SUTURE) ×4
SUT VIC AB 0 CT1 27XBRD ANTBC (SUTURE) ×2 IMPLANT
SUT VIC AB 2-0 SH 18 (SUTURE) ×6 IMPLANT
SUT VIC AB 3-0 SH 18 (SUTURE) ×6 IMPLANT
SUT VIC AB 3-0 SH 27 (SUTURE) ×2
SUT VIC AB 3-0 SH 27X BRD (SUTURE) ×1 IMPLANT
SUT VIC AB 3-0 SH 27XBRD (SUTURE) IMPLANT
SYR 20CC LL (SYRINGE) ×3 IMPLANT
TOWEL OR 17X26 10 PK STRL BLUE (TOWEL DISPOSABLE) ×3 IMPLANT

## 2014-05-16 NOTE — Anesthesia Postprocedure Evaluation (Signed)
  Anesthesia Post-op Note  Patient: [name] [name]  Procedure(s) Performed: Procedure(s) (LRB): EXCISION POSTERIOR NECK MASS, RIGHT CHEST WALL MASS AND RIGHT AXILLARY MASS AXILLARY NODE DISSECTION (N/A)  Patient Location: PACU  Anesthesia Type: General  Level of Consciousness: awake and alert   Airway and Oxygen Therapy: Patient Spontanous Breathing  Post-op Pain: mild  Post-op Assessment: Post-op Vital signs reviewed, Patient's Cardiovascular Status Stable, Respiratory Function Stable, Patent Airway and No signs of Nausea or vomiting  Last Vitals:  Filed Vitals:   05/16/14 1450  BP: 137/54  Pulse: 64  Temp: 36.1 C  Resp: 19    Post-op Vital Signs: stable   Complications: No apparent anesthesia complications

## 2014-05-16 NOTE — Anesthesia Preprocedure Evaluation (Signed)
Anesthesia Evaluation  Patient identified by MRN, date of birth, ID band Patient awake    Reviewed: Allergy & Precautions, H&P , NPO status , Patient's Chart, lab work & pertinent test results  Airway Mallampati: II TM Distance: >3 FB Neck ROM: Full    Dental no notable dental hx.    Pulmonary neg pulmonary ROS, sleep apnea and Continuous Positive Airway Pressure Ventilation , former smoker,  breath sounds clear to auscultation  Pulmonary exam normal       Cardiovascular negative cardio ROS  Rhythm:Regular Rate:Normal     Neuro/Psych negative neurological ROS  negative psych ROS   GI/Hepatic negative GI ROS, Neg liver ROS,   Endo/Other  negative endocrine ROS  Renal/GU negative Renal ROS  negative genitourinary   Musculoskeletal negative musculoskeletal ROS (+)   Abdominal   Peds negative pediatric ROS (+)  Hematology negative hematology ROS (+)   Anesthesia Other Findings   Reproductive/Obstetrics negative OB ROS                           Anesthesia Physical Anesthesia Plan  ASA: II  Anesthesia Plan: General   Post-op Pain Management:    Induction: Intravenous  Airway Management Planned: LMA and Oral ETT  Additional Equipment:   Intra-op Plan:   Post-operative Plan: Extubation in OR  Informed Consent: I have reviewed the patients History and Physical, chart, labs and discussed the procedure including the risks, benefits and alternatives for the proposed anesthesia with the patient or authorized representative who has indicated his/her understanding and acceptance.   Dental advisory given  Plan Discussed with: CRNA  Anesthesia Plan Comments:         Anesthesia Quick Evaluation

## 2014-05-16 NOTE — Transfer of Care (Signed)
Immediate Anesthesia Transfer of Care Note  Patient: [name] [name]  Procedure(s) Performed: Procedure(s): EXCISION POSTERIOR NECK MASS, RIGHT CHEST WALL MASS AND RIGHT AXILLARY MASS AXILLARY NODE DISSECTION (N/A)  Patient Location: PACU  Anesthesia Type:General  Level of Consciousness: awake, sedated and patient cooperative  Airway & Oxygen Therapy: Patient Spontanous Breathing and Patient connected to face mask oxygen  Post-op Assessment: Report given to PACU RN and Post -op Vital signs reviewed and stable  Post vital signs: Reviewed and stable  Complications: No apparent anesthesia complications

## 2014-05-16 NOTE — Discharge Instructions (Signed)
DRAIN CARE:   You have a closed bulb drain to help you heal.  A bulb drain is a small, plastic reservoir which creates a gentle suction. It is used to remove excess fluid from a surgical wound. The color and amount of fluid will vary. Immediately after surgery, the fluid is bright red. It may gradually change to a yellow color. When the amount decreases to about 1 or 2 tablespoons (15 to 30 cc) per 24 hours, your caregiver will usually remove it.  DAILY CARE  Keep the bulb compressed at all times, except while emptying it. The compression creates suction.   Keep sites where the tubes enter the skin dry and covered with a light bandage (dressing).   Tape the tubes to your skin, 1 to 2 inches below the insertion sites, to keep from pulling on your stitches. Tubes are stitched in place and will not slip out.   Pin the bulb to your shirt (not to your pants) with a safety pin.   For the first few days after surgery, there usually is more fluid in the bulb. Empty the bulb whenever it becomes half full because the bulb does not create enough suction if it is too full. Include this amount in your 24 hour totals.   When the amount of drainage decreases, empty the bulb at the same time every day. Write down the amounts and the 24 hour totals. Your caregiver will want to know them. This helps your caregiver know when the tubes can be removed.   (We anticipate removing the drain in 1-3 weeks, depending on when the output is <30mL a day for 2+ days)  If there is drainage around the tube sites, change dressings and keep the area dry. If you see a clot in the tube, leave it alone. However, if the tube does not appear to be draining, let your caregiver know.  TO EMPTY THE BULB  Open the stopper to release suction.   Holding the stopper out of the way, pour drainage into the measuring cup that was sent home with you.   Measure and write down the amount. If there are 2 bulbs, note the amount of drainage  from bulb 1 or bulb 2 and keep the totals separate. Your caregiver will want to know which tube is draining more.   Compress the bulb by folding it in half.   Replace the stopper.   Check the tape that holds the tube to your skin, and pin the bulb to your shirt.  SEEK MEDICAL CARE IF:  The drainage develops a bad odor.   You have an oral temperature above 102 F (38.9 C).   The amount of drainage from your wound suddenly increases or decreases.   You accidentally pull out your drain.   You have any other questions or concerns.  MAKE SURE YOU:   Understand these instructions.   Will watch your condition.   Will get help right away if you are not doing well or get worse.     Call our office if you have any questions about your drain. [phone]  Managing Pain  Pain after surgery or related to activity is often due to strain/injury to muscle, tendon, nerves and/or incisions.  This pain is usually short-term and will improve in a few months.   Many people find it helpful to do the following things TOGETHER to help speed the process of healing and to get back to regular activity more quickly:  1.  Avoid heavy physical activity a.  no lifting greater than 20 pounds b. Do not push through the pain.  Listen to your body and avoid positions and maneuvers than reproduce the pain c. Walking is okay as tolerated, but go slowly and stop when getting sore.  d. Remember: If it hurts to do it, then dont do it! 2. Take Anti-inflammatory medication  a. Take with food/snack around the clock for 1-2 weeks i. This helps the muscle and nerve tissues become less irritable and calm down faster b. Choose ONE of the following over-the-counter medications: i. Naproxen 220mg  tabs (ex. Aleve) 1-2 pills twice a day  ii. Ibuprofen 200mg  tabs (ex. Advil, Motrin) 3-4 pills with every meal and just before bedtime iii. Acetaminophen 500mg  tabs (Tylenol) 1-2 pills with every meal and just before  bedtime 3. Use a Heating pad or Ice/Cold Pack a. 4-6 times a day b. May use warm bath/hottub  or showers 4. Try Gentle Massage and/or Stretching  a. at the area of pain many times a day b. stop if you feel pain - do not overdo it  Try these steps together to help you body heal faster and avoid making things get worse.  Doing just one of these things may not be enough.    If you are not getting better after two weeks or are noticing you are getting worse, contact our office for further advice; we may need to re-evaluate you & see what other things we can do to help.  Lipoma A lipoma is a noncancerous (benign) tumor composed of fat cells. They are usually found under the skin (subcutaneous). A lipoma may occur in any tissue of the body that contains fat. Common areas for lipomas to appear include the back, shoulders, buttocks, and thighs. Lipomas are a very common soft tissue growth. They are soft and grow slowly. Most problems caused by a lipoma depend on where it is growing. DIAGNOSIS  A lipoma can be diagnosed with a physical exam. These tumors rarely become cancerous, but radiographic studies can help determine this for certain. Studies used may include:  Computerized X-ray scans (CT or CAT scan).  Computerized magnetic scans (MRI). TREATMENT  Small lipomas that are not causing problems may be watched. If a lipoma continues to enlarge or causes problems, removal is often the best treatment. Lipomas can also be removed to improve appearance. Surgery is done to remove the fatty cells and the surrounding capsule. Most often, this is done with medicine that numbs the area (local anesthetic). The removed tissue is examined under a microscope to make sure it is not cancerous. Keep all follow-up appointments with your caregiver. SEEK MEDICAL CARE IF:   The lipoma becomes larger or hard.  The lipoma becomes painful, red, or increasingly swollen. These could be signs of infection or a more serious  condition. Document Released: 12/03/2002 Document Revised: 03/06/2012 Document Reviewed: 05/15/2010 [facility] Patient Information 2014 [place], [place].  Exercises Following AXILIARY / Artmpit Surgery Following all surgeries on the breast or axillary nodes, whether you had radiation treatment or not, exercises may help you return to your normal activities and way of life sooner. Before beginning any exercise, talk to your caregiver about what type of exercises will be best for you. Your caregiver may recommend getting physical therapy to help you, especially if you do not see any progress in a month of exercising. Some light exercises can be done right after the surgery, but any drains and sutures will be removed  before doing the extended or heavy exercises. Generally, the exercises will lessen problems following the surgery. You can usually expect to have full range of motion of your arm back in 4 to 6 weeks.  HOME CARE INSTRUCTIONS  These exercises should be done for the first 3 to 7 days after surgery, but only with your doctor's permission.   Use your affected arm (on the side where your surgery was) as you normally would when combing your hair, bathing, dressing and eating.  Raise your affected arm above the level of your heart for 45 minutes, 2 to 3 times a day, while lying down. Put your arm on pillows so that your hand is higher than your wrist and your elbow is a little higher than your shoulder. This will help decrease the swelling that may happen after surgery.  Exercise your affected arm while it is elevated above the level of your heart by opening and closing your hand 15 to 25 times. Then, bend and straighten your elbow. Repeat this 3 to 4 times a day. This exercise helps reduce swelling by pumping lymph fluid out of your arm.  Practice deep breathing exercises (using your diaphragm) at least 6 times each day. While lying on your back, take a slow, deep breath. Breathe in as much air as  you can while trying to expand your chest and abdomen (push your belly button away from your spine). Relax and breathe out. Repeat this 4 or 5 times. This exercise will help maintain normal movement of your chest, making it easier for your lungs to expand. Continue to do deep breathing exercises from now on.  Avoid sleeping on your affected arm or on that side.  Do not lift anything over 5 pounds.  Stop exercising if you develop pain in your chest, arm or shoulder, and call your caregiver.  Let your caregiver or therapist know if your arm becomes swollen after exercising.  Exercise in front of a mirror. This way you can see yourself exercising in a correct posture and using the correct motion that is recommended.  Do not use a heating pad on your arm of the side of the surgery. GENERAL GUIDELINES FOR EXERCISE The exercises described here can be done as soon as your doctor gives you permission. Be sure to talk to your doctor before trying any of them.   You will feel some tightness in your chest and armpit after surgery. This is normal. The tightness will decrease as you continue your exercise program.  Many women have a burning, tingling, numbness, or soreness on the back of the arm and/or chest wall. This is because the surgery irritated some of your nerve endings. Although the sensations may increase a few weeks after surgery, continue to do the exercises unless you notice unusual swelling or tenderness. (Tell your caregiver if this occurs.) Sometimes rubbing or stroking the area with your hand or a soft cloth can help make the area less sensitive.  It may be helpful to do exercises after a warm shower when muscles are warm and relaxed.  Wear comfortable, loose clothing when doing the exercises.  Do the exercises until you feel a slow stretch. Hold each stretch at the end of the motion for a count of five. It is normal to feel some pulling as you stretch the skin and muscles that have been  shortened because of the surgery. Do not do bouncing or jerky-type movements when doing any of the exercises. You should not feel  pain as you do the exercises, only gentle stretching.  Do 5 to 7 repetitions of each exercise. Try to do each exercise correctly. If you have difficulty with the exercises, contact your doctor. You may need to be referred to a physical or occupational therapist.  Exercises should be done twice a day until you regain normal flexibility and strength.  Be sure to take deep breaths, in and out, as you perform each exercise.  The exercises are designed so that you begin them lying down, move to sitting, and then finish standing. EXERCISES IN LYING POSITION These exercises should be performed on a bed or on the floor while lying on your back. Keep your knees and hips bent and feet flat.  Wand Exercise This exercise helps increase the forward motion of the shoulders. You will need a broom handle, yardstick, or other similar object to perform this exercise.   Hold the wand in both hands with palms facing up.  Lift the wand up over your head (as far as you can) using your unaffected arm to help lift the wand, until you feel a stretch in your affected arm.  Hold for five seconds.  Lower arms and repeat 5 to 7 times. Elbow Winging This exercise helps increase the mobility of the front of your chest and shoulder. It may take several weeks of regular exercise before your elbows will get close to the bed (or floor).   Clasp your hands behind your neck with your elbows pointing toward the ceiling.  Move your elbows apart and down toward the bed (or floor).  Repeat 5 to 7 times. EXERCISES IN SITTING POSITION Shoulder Blade Stretch This exercise helps increase the mobility of the shoulder blades.   Sit in a chair very close to a table.  Place the unaffected arm on the table with your elbow bent and palm down. Do not move this arm during the exercise.  Place the  affected arm on the table, palm down with your elbow straight.  Without moving your trunk, slide the affected arm toward the opposite side of the table. You should feel your shoulder blade move as you do this.  Relax your arm and repeat 5 to 7 times. Shoulder Blade Squeeze This exercise also helps increase the mobility of the shoulder blade.   Facing straight ahead, sit in a chair in front of a mirror without resting on the back of the chair.  Arms should be at your sides with elbows bent.  Squeeze shoulder blades together, bringing your elbows behind you. Keep your shoulders level as you do this exercise. Do not lift your shoulders up toward your ears.  Return to the starting position and repeat 5 to 7 times. Side Bending This exercise helps increase the mobility of the trunk/body.   Clasp your hands together in front of you and lift your arms slowly over your head, straightening your arms.  When your arms are over your head, bend your trunk to the right while bending at the waist and keeping your arms overhead.  Return to the starting position and bend to the left.  Repeat 5 to 7 times. EXERCISES IN STANDING POSITION Chest Wall Stretch This exercise helps stretch the chest wall.   Stand facing a corner with toes approximately 8 to 10 inches from the corner.  Bend your elbows and place forearms on the wall, one on each side of the corner. Your elbows should be as close to shoulder height as possible.  Keep  your arms and feet in position and move your chest toward the corner. You will feel a stretch across your chest and shoulders.  Return to starting position and repeat 5 to 7 times. Shoulder Stretch This exercise helps increase the mobility in the shoulder.   Stand facing the wall with your toes approximately 8 to 10 inches from the wall.  Place your hands on the wall. Use your fingers to "climb the wall," reaching as high as you can until you feel a stretch.  Return to  starting position and repeat 5 to 7 times. THINGS TO KEEP IN MIND  Begin exercising slowly and keep going as you are able. Stop exercising and call your caregiver if you:  Get weaker, start losing your balance or start falling.  Have pain that gets worse.  Have new heaviness in your arm.  Have unusual swelling, or swelling gets worse.  Have headaches, dizziness, blurred vision, new numbness or tingling in arms or chest. It is important to exercise to keep muscles working as well as possible, but it is also important to be safe. Talk with your caregiver about realistic exercises for your condition. Then set goals for increasing your physical activity level.  Document Released: 07/05/2006 Document Revised: 04/09/2013 Document Reviewed: 01/27/2009 [facility] Patient Information 2014 [place].

## 2014-05-16 NOTE — Op Note (Signed)
05/16/2014  2:59 PM  PATIENT:  [name] [name]  73 y.o. male  Patient Care Team: Mosie Lukes, MD as PCP - General (Family Medicine) Gatha Mayer, MD as Consulting Physician (Gastroenterology) Bonnita Levan, MD as Consulting Physician (Dermatology)  PRE-OPERATIVE DIAGNOSIS:  right chest wall subfacial mass, right anterior chest wall subcutaneous mass, posterior neck subcutaneous mass  POST-OPERATIVE DIAGNOSIS:    right chest wall subfacial mass adherent to rib right anterior chest wall subcutaneous mass posterior neck subcutaneous mass  PROCEDURE:  Procedure(s): EXCISION POSTERIOR NECK MASS EXCISION OF RIGHT CHEST WALL MASS WITH RESECTION OF ANTERIOR RIB RIGHT AXILLARY MASS excision with AXILLARY NODE DISSECTION  SURGEON:  Surgeon(s): Adin Hector, MD  ASSISTANT: RNFA   ANESTHESIA:   local and general  EBL:  Total I/O In: 1600 [I.V.:1600] Out: 350 [Blood:350]  Delay start of Pharmacological VTE agent (>24hrs) due to surgical blood loss or risk of bleeding:  no  DRAINS: (19Fr) Blake drain(s) in the (lateral exit site = right axilla) & (medial = right chest wall)   SPECIMEN:  Source of Specimen:  1.  Posterior neck mass.  2.  Right infraclavicular anterior chest wall masss  3.  Axillary mass with axillary contents  4.  Right deep chest wall mass with rib  DISPOSITION OF SPECIMEN:  PATHOLOGY  COUNTS:  YES  PLAN OF CARE: Admit for overnight observation  PATIENT DISPOSITION:  PACU - hemodynamically stable.  INDICATION:   Pleasant male with numerous masses on body. Slowly enlarging mass of posterior neck. Also on the right collarbone.  Largest in  right axilla. Increasing in size. MRI done last year showed 12 cm mass adherent to the rib cage but not invading it. The patient has a history of melanoma status s/p resection on posterior back. Because of history of melanoma and changes in size the largest mass, recommended removal. Recommended we'll tumescence and  stated there were 2 little bit as well. I discussed with the patient:  The pathophysiology of subcutaneous & deep chest wall masses was discussed.  Natural history risks without surgery were discussed.  I recommended surgery to remove the mass.  I explained the technique of removal with use of local anesthesia & possible need for more aggressive sedation/anesthesia for patient comfort.    Risks such as bleeding, infection, heart attack, death, and other risks were discussed.  I noted a good likelihood this will help address the problem.   Possibility that this will not correct all symptoms was explained. Possibility of regrowth/recurrence of the mass was discussed.  We will work to minimize complications. Questions were answered.  The patient expresses understanding & wishes to proceed with surgery.  OR FINDINGS:   4 x 4 by 3 cm fatty mass of posterior back. Most likely lipoma.  Soft ellipsoid well encapsulated mass of right anterior chest wall. 2 clavicular space. The Bard 4 x 3 cm.  Large right axillary lipomatous mass involving axillary contents with lymph nodes removed with axillary lymph node dissection.  Large mostly fatty chest wall mass deep to serratus anterior. 13x12x7 cm. The base of the mass adherent to anterior third rib. Third rib resected x5 cm length to remove mass en bloc  DESCRIPTION:   Informed consent was confirmed. Patient underwent general anesthesia without difficulty. He was positioned left side down decubitus. His back tracts and axilla were appropriately positioned. Right arm allowed to extend and/or slightly posterior to help expose the axilla. Carefully supported. He was prepped and draped in  a sterile fashion.  I started with attention to the posterior neck mass. I made a transverse incision through the skin and dermal tissue. I encountered a mostly fatty and fibrotic mass. It was somewhat encapsulated. I excised intact and freed it off the posterior neck musculature.  I ensured hemostasis. I closed the wound using interrupted 2-0 Vicryl deep dermal stitches and 4-0 Monocryl running subcuticular stitch & steristrips. Sterile gauze dressing was placed.  Next I turned attention to the infraclavicular fossa on the right anterior chest wall. I made a longitudinal incision over the central part of the ellipsoid mass. I came through the skin and encountered a well encapsulated mass. I freed this fatty mass off the abdominal wall and pectoralis fascia quite easily. I was able to release it more laterally using cautery. I ensured hemostasis. Wound was packed.  Then I turned attention to the right axilla. I made a curvilinear incision in the right axillary hairline. Later I had to extend a little more cephalad on the anterior axillary line. I went through the subcutaneous dermal tissues. I encountered a mostly well encapsulated mass in the axilla. I carefully mobilized around it using blunt and some focused cautery dissection. It did carry over into the proximal medial inner arm. Eventually freed off superiorly off the axillary vessels and nerve, staying away from that. I was able to free it off  the serratus anterior more deeply.  There was no free the mass off medially underneath the pectoralis major and minor. Did not need to go more medial than level II nodes. Lymph nodes were obviously involved. Carefully freed off & removed it intact.  Then I encountered the very deep mass that was pushing up the serratus anterior. I started removal of this deepest mass with splitting through the serratus anterior along the fibers and coronary large encapsulated mass. It was smoothed & well encapsulated.  It was fixed deeply.  I could free around the margins anteriorly laterally superiorly and inferiorly. Carefully came around medially as well. . I was able to come around and feels that this mass which was mostly fatty had a calcified base that was densely adherent to the anterior third rib. I  could not free it off it safely.   Therefore I decided to excise the section of the 3rd anterior rib than the mass was fixed to.  I Carefully skeletonized over the medial right anterior 3rd rib. I came around and freed the pleural off it more deeply & continued more laterally. I came around superiorly. I took care to preserve the intercostal vessels & nerve. I used a rib cutter to cut the rib about 2 cm medial to the attachment. I was able to come around lateral to the mass and do another 2 cm margin laterally. With that the mass came out en bloc.  We did copious irrigation. Used bone wax to assure hemostasis on the ribs and smooth the edges. I inspected the pleura: it was intact with no breech. I placed clips along the intercostal edges & the whole region of the wound to help mark out the area should radiation be needed.  I did ensure hemostasis in the wound. I closed the intercostal musculature and fascia together using a #0 Vicryl running stitch to good effect. I placed drains from the right anterior external line up into the axillary & chest wounds. Drains are secured using Prolene stitch.  I then closed the clavipectoral fascia along the pectoralis major and more laterally using interrupted  2-0 Vicryl stitches. I brought the tail of the drain up in the subcutaneous tissues. Then closed subcutaneous using 2-0 Vicryl stitches and the skin with 4-0 Monocryl stitch and Steri-Strips. I then closed the infraclavicular wound using 3-0 Vicryl interrupted deep dermal stitches for Monocryl Steri-Strips and sterile dressings.    Patient went to recovery room stable. Chest x-ray in PACU shows no pneumothorax.  I discussed OR findings with the patient's wife. Postop obstructions were discussed. Questions answered. She expressed understanding and appreciation.

## 2014-05-16 NOTE — H&P (Signed)
[place], MD, [address] [place], [place] [address] Phone: [phone] FAX: [phone]     [name] [name]  10-Feb-1941 [id number]  CARE TEAM:  PCP: [name], MD  Outpatient Care Team: Patient Care Team: Mosie Lukes, MD as PCP - General (Family Medicine) Gatha Mayer, MD as Consulting Physician (Gastroenterology) Bonnita Levan, MD as Consulting Physician (Dermatology)  Inpatient Treatment Team: Treatment Team: Attending Provider: Adin Hector, MD  This patient is a 73 y.o.male who presents today for surgical evaluation   Reason for evaluation: Neck, chest, axillary masses  Pleasant male.  History of melanoma on back excised by his dermatologist. History of shingles with postherpetic neuralgia type pains going from right anterior chest wall to back. Also has noted lumps around his armpit and shoulder. Question of shoulder discomfort. He has had persistent symptoms with the masses. Therefore, surgical consultation requested to see if removal of them. The patient thinks they may have gotten a little larger but not dramatically. His never gotten very inflamed. No drainage. No blood or pus. MRI had been done of the region.  C/w lipomas.  Requests removal   Past Medical History  Diagnosis Date  . GERD (gastroesophageal reflux disease)     controlled w/ diet and behavioral changes  . BPH (benign prostatic hypertrophy)   . Elevated PSA   . White coat hypertension   . Shingles 07/09/2013  . Encounter for Medicare annual wellness exam 09/22/2013    Sees Dr Wilhemina Bonito of Derm Sees Dr Silvano Rusk of Gastroenterology Sees Dr Kathie Rhodes of [facility] Sees Dr Susa Day of Optometry  . Snoring disorder 07/30/2011  . Melanoma of back     Excised Dr Wilhemina Bonito  . Sleep apnea 07/30/2011    cpap- 13     History reviewed. No pertinent past surgical history.  History   Social History   . Marital Status: Married    Spouse Name: [name]    Number of Children: 2  . Years of Education: N/A   Occupational History  . retired     Administrator   Social History Main Topics  . Smoking status: Former Smoker    Quit date: 02/11/1989  . Smokeless tobacco: Never Used  . Alcohol Use: 4.2 oz/week    7 Shots of liquor per week     Comment: 1 per day  . Drug Use: No  . Sexual Activity: Not on file   Other Topics Concern  . Not on file   Social History Narrative  . No narrative on file    Family History  Problem Relation Age of Onset  . Cancer Mother     MM, leukemia  . Emphysema Father   . Cancer Sister     brain  . Alcohol abuse Other   . COPD Brother   . Heart disease Paternal Grandmother   . COPD Paternal Grandfather   . Diabetes Sister   . Obesity Sister   . Down syndrome Brother   . Colon cancer Neg Hx     Current Facility-Administered Medications  Medication Dose Route Frequency Provider Last Rate Last Dose  . ceFAZolin (ANCEF) IVPB 2 g/50 mL premix  2 g Intravenous On Call to OR Adin Hector, MD      . lactated ringers infusion   Intravenous Continuous Montez Hageman, MD 125 mL/hr at 05/16/14 1014 1,000 mL at 05/16/14  1014     No Known Allergies  ROS: Constitutional:  No fevers, chills, sweats.  Weight stable Eyes:  No vision changes, No discharge HENT:  No sore throats, nasal drainage Lymph: No neck swelling, No bruising easily Pulmonary:  No cough, productive sputum CV: No orthopnea, PND  No exertional chest/neck/shoulder/arm pain. GI: No personal nor family history of GI/colon cancer, inflammatory bowel disease, irritable bowel syndrome, allergy such as Celiac Sprue, dietary/dairy problems, colitis, ulcers nor gastritis.  No recent sick contacts/gastroenteritis.  No travel outside the country.  No changes in diet. Renal: No UTIs, No hematuria Genital:  No drainage, bleeding, masses Musculoskeletal: No severe joint pain.  Good ROM major  joints Skin:  No sores or lesions.  No rashes Heme/Lymph:  No easy bleeding.  No swollen lymph nodes Neuro: No focal weakness/numbness.  No seizures Psych: No suicidal ideation.  No hallucinations  BP 124/70  Pulse 45  Temp(Src) 97.5 F (36.4 C) (Oral)  Resp 18  SpO2 100%  Physical Exam: General: Pt awake/alert/oriented x4 in no major acute distress Eyes: PERRL, normal EOM. Sclera nonicteric Neuro: CN II-XII intact w/o focal sensory/motor deficits. Lymph: No head/neck/groin lymphadenopathy Psych:  No delerium/psychosis/paranoia HENT: Normocephalic, Mucus membranes moist.  No thrush Neck: Supple, No tracheal deviation Chest: No pain.  Good respiratory excursion. CV:  Pulses intact.  Regular rhythm Abdomen: Soft, Nondistended.  Nontender.  No incarcerated hernias. Ext:  SCDs BLE.  No significant edema.  No cyanosis Skin: No petechiae / purpurea.  No major sores Musculoskeletal: No severe joint pain.  Good ROM major joints MS:  Neck & chest wall masses 4cm.  Larger soft R axillary mass - no change   Results:   Labs: No results found for this or any previous visit (from the past 48 hour(s)).  Imaging / Studies: No results found.  Medications / Allergies: per chart  Antibiotics: Anti-infectives   Start     Dose/Rate Route Frequency Ordered Stop   05/16/14 1014  ceFAZolin (ANCEF) IVPB 2 g/50 mL premix     2 g 100 mL/hr over 30 Minutes Intravenous On call to O.R. 05/16/14 1014 05/17/14 0559      Assessment  [name] [name]  73 y.o. male  Day of Surgery  Procedure(s): EXCISION right chest wall subfacial mass, right anterior chest wall subcutaneous mass, posterior neck subcutaneous mass  Problem List:  Active Problems:   * No active hospital problems. *   Large right axillary/chest wall paresthesias with masses In a patient with an ipsilateral mid back melanoma removed.   Large fatty mass on right chest wall most likely lipoma. Smaller right anterior subcutaneous  mass overlying right pectoral region.   Smaller posterior neck mass.   Plan:  Removal:  The pathophysiology of masses was discussed.  Natural history risks without surgery were discussed.  I recommended surgery to remove the mass.  I explained the technique of removal with use of local anesthesia & possible need for more aggressive sedation/anesthesia for patient comfort.    Risks such as bleeding, infection, heart attack, death, and other risks were discussed.  I noted a good likelihood this will help address the problem.   Possibility that this will not correct all symptoms was explained. Possibility of regrowth/recurrence of the mass was discussed.  We will work to minimize complications. Questions were answered.  The patient expresses understanding & wishes to proceed with surgery.   -VTE prophylaxis- SCDs, etc -mobilize as tolerated to help recovery    Remo Lipps C.  Johney Maine, M.D., F.A.C.S. Gastrointestinal and Minimally Invasive Surgery [facility] [address], [place], [place] [address] [phone] Main / Paging   05/16/2014  Note: This dictation was prepared with Dragon/digital dictation along with [facility] technology. Any transcriptional errors that result from this process are unintentional.

## 2014-05-16 NOTE — Progress Notes (Addendum)
RT went to talk with patient about wearing CPAP tonight. Patient stated that he was to go home but ended up staying overnight tonight. Plans are to send him home in the AM. Patient stated that he would be okay without CPAP and will wear 2 liters oxygen. RT explained to patient that if he changed his mind about wearing one to let his RN know so she can contact RT. CPAP consult completed per order.

## 2014-05-17 ENCOUNTER — Encounter (HOSPITAL_COMMUNITY): Payer: Self-pay | Admitting: Surgery

## 2014-05-17 NOTE — Discharge Summary (Signed)
Physician Discharge Summary  Patient ID: [name] [name] MRN: [id number] DOB/AGE: Dec 25, 1941 73 y.o.  Admit date: 05/16/2014 Discharge date: 05/17/2014  Admission Diagnoses:  Discharge Diagnoses:  Principal Problem:   Mass of chest wall, right subfacial, 12cm s/p removal w rib resection 05/16/2014 Active Problems:   Mass of posterior neck, SQ 4cm   Mass of right chest wall   Discharged Condition: good  Hospital Course: Pt underwent surgery.. Postoperatively, the patient mobilized in the hallways and advanced to a solid diet gradually.  Pain was well-controlled and transitioned off IV medications.    By the time of discharge, the patient was walking well the hallways, using his RUE rather well, eating food well, having flatus.  Pain was-controlled on an oral regimen.  Based on meeting DC criteria and recovering well, I felt it was safe for the patient to be discharged home with close followup.  Instructions were discussed in detail.  They are written as well.     Consults: None  Significant Diagnostic Studies:   Treatments:   POST-OPERATIVE DIAGNOSIS:  right chest wall subfacial mass adherent to rib  right anterior chest wall subcutaneous mass  posterior neck subcutaneous mass  PROCEDURE: Procedure(s):  EXCISION POSTERIOR NECK MASS  EXCISION OF RIGHT CHEST WALL MASS WITH RESECTION OF ANTERIOR RIB  RIGHT AXILLARY MASS excision with AXILLARY NODE DISSECTION  SURGEON: Surgeon(s):  Adin Hector, MD   Discharge Exam: Blood pressure 135/77, pulse 66, temperature 98.1 F (36.7 C), temperature source Oral, resp. rate 18, height 5\' 10"  (1.778 m), weight 224 lb (101.606 kg), SpO2 96.00%.  General: Pt awake/alert/oriented x4 in no major acute distress Eyes: PERRL, normal EOM. Sclera nonicteric Neuro: CN II-XII intact w/o focal sensory/motor deficits. Lymph: No head/neck/groin lymphadenopathy Psych:  No delerium/psychosis/paranoia HENT: Normocephalic, Mucus membranes moist.  No  thrush Neck: Supple, No tracheal deviation Chest: No pain.  Good respiratory excursion. CV:  Pulses intact.  Regular rhythm MS: Normal AROM mjr joints.  No obvious deformity Abdomen: Soft, Nondistended.  Nontender.  No incarcerated hernias. Ext:  SCDs BLE.  No significant edema.  No cyanosis.  Using RUE well.  Soreness w R shoulder ext Skin: No petechiae / purpura   Disposition: 01-Home or Self Care  Discharge Instructions   Call MD for:  extreme fatigue    Complete by:  As directed      Call MD for:  hives    Complete by:  As directed      Call MD for:  persistant nausea and vomiting    Complete by:  As directed      Call MD for:  redness, tenderness, or signs of infection (pain, swelling, redness, odor or green/yellow discharge around incision site)    Complete by:  As directed      Call MD for:  severe uncontrolled pain    Complete by:  As directed      Call MD for:    Complete by:  As directed   Temperature > 101.47F     Diet - low sodium heart healthy    Complete by:  As directed      Discharge instructions    Complete by:  As directed   Please see discharge instruction sheets.  Also refer to handout given an office.  Please call our office if you have any questions or concerns [phone]     Discharge wound care:    Complete by:  As directed   If you have closed incisions, shower and  bathe over these incisions with soap and water every day.  Remove all surgical dressings on postoperative day #3.  You do not need to replace dressings over the closed incisions unless you feel more comfortable with a Band-Aid covering it.   If you have an open wound that requires packing, please see wound care instructions.  In general, remove all dressings, wash wound with soap and water and then replace with saline moistened gauze.  Do the dressing change at least every day.  Please call our office [phone] if you have further questions.     Driving Restrictions    Complete by:  As  directed   No driving until off narcotics and can safely swerve away without pain during an emergency     Increase activity slowly    Complete by:  As directed   Walk an hour a day.  Use 20-30 minute walks.  When you can walk 30 minutes without difficulty, increase to low impact/moderate activities such as biking, jogging, swimming, sexual activity..  Eventually can increase to unrestricted activity when not feeling pain.  If you feel pain: STOP!Marland Kitchen   Let pain protect you from overdoing it.  Use ice/heat/over-the-counter pain medications to help minimize his soreness.  Use pain prescriptions as needed to remain active.  It is better to take extra pain medications and be more active than to stay bedridden to avoid all pain medications.     Lifting restrictions    Complete by:  As directed   Avoid heavy lifting initially.  Do not push through pain.  You have no specific weight limit.  Coughing and sneezing or four more stressful to your incision than any lifting you will do. Pain will protect you from injury.  Therefore, avoid intense activity until off all narcotic pain medications.  Coughing and sneezing or four more stressful to your incision than any lifting he will do.     May shower / Bathe    Complete by:  As directed      May walk up steps    Complete by:  As directed      Sexual Activity Restrictions    Complete by:  As directed   Sexual activity as tolerated.  Do not push through pain.  Pain will protect you from injury.     Walk with assistance    Complete by:  As directed   Walk over an hour a day.  May use a walker/cane/companion to help with balance and stamina.            Medication List         aspirin 81 MG chewable tablet  Chew 81 mg by mouth every Monday, Wednesday, and Friday.     gabapentin 300 MG capsule  Commonly known as:  NEURONTIN  Take 3 capsules by mouth three times a day.     KRILL OIL PO  Take 1 capsule by mouth daily.     multivitamin with minerals Tabs  tablet  Take 1 tablet by mouth daily.     naproxen 500 MG tablet  Commonly known as:  NAPROSYN  Take 1 tablet (500 mg total) by mouth 2 (two) times daily with a meal.     oxyCODONE 5 MG immediate release tablet  Commonly known as:  Oxy IR/ROXICODONE  Take 1-2 tablets (5-10 mg total) by mouth every 4 (four) hours as needed for moderate pain, severe pain or breakthrough pain.  Follow-up Information   Follow up with Cylie Dor C., MD. Schedule an appointment as soon as possible for a visit in 3 weeks. (To follow up after your operation)    Specialty:  General Surgery   Contact information:   [address] [place] [address] [phone]       Follow up with [place] In 2 weeks. (To have your drain removed & incisions re-checked)    Contact information:   [address] [place] [address] [phone]      Signed: Adin Hector 05/17/2014, 9:05 AM

## 2014-05-21 ENCOUNTER — Ambulatory Visit (INDEPENDENT_AMBULATORY_CARE_PROVIDER_SITE_OTHER): Payer: Medicare Other | Admitting: Family Medicine

## 2014-05-21 ENCOUNTER — Telehealth: Payer: Self-pay | Admitting: Family Medicine

## 2014-05-21 ENCOUNTER — Encounter: Payer: Self-pay | Admitting: Family Medicine

## 2014-05-21 VITALS — BP 104/68 | HR 66 | Temp 98.0°F | Ht 70.5 in | Wt 229.0 lb

## 2014-05-21 DIAGNOSIS — R222 Localized swelling, mass and lump, trunk: Secondary | ICD-10-CM | POA: Diagnosis not present

## 2014-05-21 DIAGNOSIS — K219 Gastro-esophageal reflux disease without esophagitis: Secondary | ICD-10-CM | POA: Diagnosis not present

## 2014-05-21 DIAGNOSIS — N400 Enlarged prostate without lower urinary tract symptoms: Secondary | ICD-10-CM

## 2014-05-21 DIAGNOSIS — R030 Elevated blood-pressure reading, without diagnosis of hypertension: Secondary | ICD-10-CM

## 2014-05-21 DIAGNOSIS — G4730 Sleep apnea, unspecified: Secondary | ICD-10-CM | POA: Diagnosis not present

## 2014-05-21 DIAGNOSIS — R0789 Other chest pain: Secondary | ICD-10-CM

## 2014-05-21 DIAGNOSIS — Z0000 Encounter for general adult medical examination without abnormal findings: Secondary | ICD-10-CM

## 2014-05-21 NOTE — Telephone Encounter (Signed)
Elevated bp and atypical chest pain should cover tsh and might cover lipids, if it does not then skip the lipids

## 2014-05-21 NOTE — Telephone Encounter (Signed)
What other diagnosis do you want? Lipid and TSH will not go under any problems pt has in list?

## 2014-05-21 NOTE — Patient Instructions (Signed)

## 2014-05-21 NOTE — Telephone Encounter (Signed)
Lab order week of 10-11-2014 disposition: Return in about 4 months (around 09/21/2014) for Medicare wellness and PE, lipid, renal, cbc, tsh, hepatic, ps, lipid, renal, cbc, tsh, hepatic, psa

## 2014-05-21 NOTE — Progress Notes (Signed)
Pre visit review using our clinic review tool, if applicable. No additional management support is needed unless otherwise documented below in the visit note. 

## 2014-05-22 NOTE — Telephone Encounter (Signed)
This covers lipids but not TSH?

## 2014-05-22 NOTE — Telephone Encounter (Signed)
That is not what I expected. So try reflux and sleep apnea for TSH and if no luck just do not order

## 2014-05-23 NOTE — Telephone Encounter (Signed)
It didn't work so I cancelled the TSH

## 2014-05-24 ENCOUNTER — Telehealth (INDEPENDENT_AMBULATORY_CARE_PROVIDER_SITE_OTHER): Payer: Self-pay

## 2014-05-24 NOTE — Telephone Encounter (Signed)
Message copied by Illene Regulus on Fri May 24, 2014  2:10 PM ------      Message from: Adin Hector      Created: Thu May 23, 2014  5:08 PM       Pathology shows benign results: Lipomas.  No melanoma.  No sarcoma.              Chimere Klingensmith, CCS MA, please call on the patient to make sure recovery is going well & tell pt the good news on the pathology.  Thanks,            Adin Hector, M.D., F.A.C.S.      Gastrointestinal and Minimally Invasive Surgery      [facility]      [address], [place]      [place], [place] [address]      [phone] Main / Paging            ! ------

## 2014-05-24 NOTE — Telephone Encounter (Signed)
Called [name] [name] to notify him of his pathology report showing benign lipoma's per Dr Johney Maine. [name] [name] notified no melanoma and no sarcoma per Dr Johney Maine. The [name] [name] understands.

## 2014-05-26 ENCOUNTER — Encounter: Payer: Self-pay | Admitting: Family Medicine

## 2014-05-26 NOTE — Assessment & Plan Note (Signed)
Tolerating CPAP now that it has been set up. Continue usage daily

## 2014-05-26 NOTE — Progress Notes (Signed)
Patient ID: [name] [name], male   DOB: 12-12-1941, 73 y.o.   MRN: [id number] [name] [name] [id number] [birth year] 05/26/2014      Progress Note-Follow Up  Subjective  Chief Complaint  Chief Complaint  Patient presents with  . Follow-up    on CPAP    HPI  Patient is a 73 year old male in today for routine medical care. He is here today to follow up on CPAP. He has tolerted the machine and is using it routinely. Is sleeping better as is his wife. He has had his chest wall lipoma surgically removed and has tolerate that well. Still has the surgical drain in place and is following closely with surgery. Denies any recent illness. Well controlled, no changes to meds. Encouraged heart healthy diet such as the DASH diet and exercise as tolerated.   Past Medical History  Diagnosis Date  . GERD (gastroesophageal reflux disease)     controlled w/ diet and behavioral changes  . BPH (benign prostatic hypertrophy)   . Elevated PSA   . White coat hypertension   . Shingles 07/09/2013  . Encounter for Medicare annual wellness exam 09/22/2013    Sees Dr Wilhemina Bonito of Derm Sees Dr Silvano Rusk of Gastroenterology Sees Dr Kathie Rhodes of [facility] Sees Dr Susa Day of Optometry  . Snoring disorder 07/30/2011  . Melanoma of back     Excised Dr Wilhemina Bonito  . Sleep apnea 07/30/2011    cpap- 13     Past Surgical History  Procedure Laterality Date  . Mass excision N/A 05/16/2014    Procedure: EXCISION POSTERIOR NECK MASS, RIGHT CHEST WALL MASS AND RIGHT AXILLARY MASS AXILLARY NODE DISSECTION;  Surgeon: Adin Hector, MD;  Location: [facility];  Service: General;  Laterality: N/A;    Family History  Problem Relation Age of Onset  . Cancer Mother     MM, leukemia  . Emphysema Father   . Cancer Sister     brain  . Alcohol abuse Other   . COPD Brother   . Heart disease Paternal Grandmother   . COPD Paternal Grandfather   . Diabetes Sister   . Obesity Sister   . Down syndrome Brother   . Colon  cancer Neg Hx     History   Social History  . Marital Status: Married    Spouse Name: [name]    Number of Children: 2  . Years of Education: N/A   Occupational History  . retired     Administrator   Social History Main Topics  . Smoking status: Former Smoker    Quit date: 02/11/1989  . Smokeless tobacco: Never Used  . Alcohol Use: 4.2 oz/week    7 Shots of liquor per week     Comment: 1 per day  . Drug Use: No  . Sexual Activity: Not on file   Other Topics Concern  . Not on file   Social History Narrative  . No narrative on file    Current Outpatient Prescriptions on File Prior to Visit  Medication Sig Dispense Refill  . aspirin 81 MG chewable tablet Chew 81 mg by mouth every Monday, Wednesday, and Friday.       . gabapentin (NEURONTIN) 300 MG capsule Take 3 capsules by mouth three times a day.  270 capsule  2  . KRILL OIL PO Take 1 capsule by mouth daily.      . Multiple Vitamin (MULTIVITAMIN WITH MINERALS) TABS tablet Take 1 tablet by  mouth daily.      . naproxen (NAPROSYN) 500 MG tablet Take 1 tablet (500 mg total) by mouth 2 (two) times daily with a meal.  40 tablet  1  . oxyCODONE (OXY IR/ROXICODONE) 5 MG immediate release tablet Take 1-2 tablets (5-10 mg total) by mouth every 4 (four) hours as needed for moderate pain, severe pain or breakthrough pain.  40 tablet  0   No current facility-administered medications on file prior to visit.    No Known Allergies  Review of Systems  Review of Systems  Constitutional: Negative for fever and malaise/fatigue.  HENT: Negative for congestion.   Eyes: Negative for discharge.  Respiratory: Negative for shortness of breath.   Cardiovascular: Negative for chest pain, palpitations and leg swelling.  Gastrointestinal: Negative for nausea, abdominal pain and diarrhea.  Genitourinary: Negative for dysuria.  Musculoskeletal: Negative for falls.  Skin: Negative for rash.  Neurological: Negative for loss of consciousness and  headaches.  Endo/Heme/Allergies: Negative for polydipsia.  Psychiatric/Behavioral: Negative for depression and suicidal ideas. The patient is not nervous/anxious and does not have insomnia.     Objective  BP 104/68  Pulse 66  Temp(Src) 98 F (36.7 C) (Oral)  Ht 5' 10.5" (1.791 m)  Wt 229 lb (103.874 kg)  BMI 32.38 kg/m2  SpO2 95%  Physical Exam  Physical Exam  Constitutional: He is oriented to person, place, and time and well-developed, well-nourished, and in no distress. No distress.  HENT:  Head: Normocephalic and atraumatic.  Eyes: Conjunctivae are normal.  Neck: Neck supple. No thyromegaly present.  Cardiovascular: Normal rate, regular rhythm and normal heart sounds.   No murmur heard. Pulmonary/Chest: Effort normal and breath sounds normal. No respiratory distress.  Abdominal: He exhibits no distension and no mass. There is no tenderness.  Musculoskeletal: He exhibits no edema.  Neurological: He is alert and oriented to person, place, and time.  Skin: Skin is warm.  Surgical drain right upper, anterior chest wall. No surrounding erythema or fluctuance  Psychiatric: Memory, affect and judgment normal.    Lab Results  Component Value Date   TSH 2.842 09/13/2013   Lab Results  Component Value Date   WBC 7.5 05/08/2014   HGB 13.2 05/08/2014   HCT 39.3 05/08/2014   MCV 87.5 05/08/2014   PLT 220 05/08/2014   Lab Results  Component Value Date   CREATININE 0.90 02/11/2014   BUN 16 02/11/2014   NA 138 02/11/2014   K 4.5 02/11/2014   CL 105 02/11/2014   CO2 28 02/11/2014   Lab Results  Component Value Date   ALT 23 02/11/2014   AST 20 02/11/2014   ALKPHOS 54 02/11/2014   BILITOT 0.4 02/11/2014   Lab Results  Component Value Date   CHOL 157 07/20/2011   Lab Results  Component Value Date   HDL 46 07/20/2011   Lab Results  Component Value Date   LDLCALC 95 07/20/2011   Lab Results  Component Value Date   TRIG 82 07/20/2011   Lab Results  Component Value Date    CHOLHDL 3.4 07/20/2011     Assessment & Plan  Mass of right chest wall Has tolerated surgical removal well. Drains still in place. Following close with surgery. Site looks good today. Pathology benign on review.   ELEVATED BP READING WITHOUT DX HYPERTENSION Well controlled. Encouraged heart healthy diet such as the DASH diet and exercise as tolerated.   Sleep apnea Tolerating CPAP now that it has been set up. Continue  usage daily  GERD Avoid offending foods, start probiotics. Do not eat large meals in late evening and consider raising head of bed.

## 2014-05-26 NOTE — Assessment & Plan Note (Signed)
Well controlled. Encouraged heart healthy diet such as the DASH diet and exercise as tolerated.  

## 2014-05-26 NOTE — Assessment & Plan Note (Signed)
Has tolerated surgical removal well. Drains still in place. Following close with surgery. Site looks good today. Pathology benign on review.

## 2014-05-26 NOTE — Assessment & Plan Note (Signed)
Avoid offending foods, start probiotics. Do not eat large meals in late evening and consider raising head of bed.  

## 2014-05-30 ENCOUNTER — Telehealth (INDEPENDENT_AMBULATORY_CARE_PROVIDER_SITE_OTHER): Payer: Self-pay

## 2014-05-30 NOTE — Telephone Encounter (Signed)
We can try and see if he can avoid a new drain, but I am concerned that is at risk for him.  If he has worsening swelling, probably will need seroma aspirated or new drain placed in the office.

## 2014-05-30 NOTE — Telephone Encounter (Signed)
Spoke with wife, she states that they have not noticed any additional swelling. Encouraged pt to closely watch the site for any worsening swelling. Advised the wife to give [name] a call back if they notice any additional swelling. Wife verbalized understanding and agrees with POC.

## 2014-05-30 NOTE — Telephone Encounter (Signed)
Pt s/p excision of neck mass on 05/16/14. Wife is calling today to let us know that one of the two drains fell out during the night last night. Wife states that they have covered the area with a guaze and they have noticed a little amt of draining. Pt denies any fevers, chills. Pt states there is a little redness and a little swelling at the site. Pt has a f/u appt on 6/8. Advised pt to keep a check on the guaze to make sure it is not saturating, any increased redness or swelling.  Informed pt that I would send Dr Johney Maine a message and see if he would like for pt to come in before his appt date. Pt verbalized understanding and agrees with POC.

## 2014-06-03 ENCOUNTER — Ambulatory Visit (INDEPENDENT_AMBULATORY_CARE_PROVIDER_SITE_OTHER): Payer: Medicare Other | Admitting: Surgery

## 2014-06-03 ENCOUNTER — Encounter (INDEPENDENT_AMBULATORY_CARE_PROVIDER_SITE_OTHER): Payer: Self-pay | Admitting: Surgery

## 2014-06-03 VITALS — BP 126/80 | HR 57 | Temp 97.5°F | Ht 70.0 in | Wt 226.0 lb

## 2014-06-03 DIAGNOSIS — R220 Localized swelling, mass and lump, head: Secondary | ICD-10-CM

## 2014-06-03 DIAGNOSIS — R221 Localized swelling, mass and lump, neck: Secondary | ICD-10-CM

## 2014-06-03 DIAGNOSIS — C4359 Malignant melanoma of other part of trunk: Secondary | ICD-10-CM | POA: Insufficient documentation

## 2014-06-03 DIAGNOSIS — R222 Localized swelling, mass and lump, trunk: Secondary | ICD-10-CM

## 2014-06-03 NOTE — Patient Instructions (Signed)
GENERAL SURGERY: POST OP INSTRUCTIONS  1. DIET: Follow a light bland diet the first 24 hours after arrival home, such as soup, liquids, crackers, etc.  Be sure to include lots of fluids daily.  Avoid fast food or heavy meals as your are more likely to get nauseated.   2. Take your usually prescribed home medications unless otherwise directed. 3. PAIN CONTROL: a. Pain is best controlled by a usual combination of three different methods TOGETHER: i. Ice/Heat ii. Over the counter pain medication iii. Prescription pain medication b. Most patients will experience some swelling and bruising around the incisions.  Ice packs or heating pads (30-60 minutes up to 6 times a day) will help. Use ice for the first few days to help decrease swelling and bruising, then switch to heat to help relax tight/sore spots and speed recovery.  Some people prefer to use ice alone, heat alone, alternating between ice & heat.  Experiment to what works for you.  Swelling and bruising can take several weeks to resolve.   c. It is helpful to take an over-the-counter pain medication regularly for the first few weeks.  Choose one of the following that works best for you: i. Naproxen (Aleve, etc)  Two 274m tabs twice a day ii. Ibuprofen (Advil, etc) Three 2085mtabs four times a day (every meal & bedtime) iii. Acetaminophen (Tylenol, etc) 500-65021mour times a day (every meal & bedtime) d. A  prescription for pain medication (such as oxycodone, hydrocodone, etc) should be given to you upon discharge.  Take your pain medication as prescribed.  i. If you are having problems/concerns with the prescription medicine (does not control pain, nausea, vomiting, rash, itching, etc), please call us [name] see if we need to switch you to a different pain medicine that will work better for you and/or control your side effect better. ii. If you need a refill on your pain medication, please contact your pharmacy.  They will contact our  office to request authorization. Prescriptions will not be filled after 5 pm or on week-ends. 4. Avoid getting constipated.  Between the surgery and the pain medications, it is common to experience some constipation.  Increasing fluid intake and taking a fiber supplement (such as Metamucil, Citrucel, FiberCon, MiraLax, etc) 1-2 times a day regularly will usually help prevent this problem from occurring.  A mild laxative (prune juice, Milk of Magnesia, MiraLax, etc) should be taken according to package directions if there are no bowel movements after 48 hours.   5. Wash / shower every day.  You may shower over the dressings as they are waterproof.  Continue to shower over incision(s) after the dressing is off. 6. Remove your waterproof bandages 5 days after surgery.  You may leave the incision open to air.  You may have skin tapes (Steri Strips) covering the incision(s).  Leave them on until one week, then remove.  You may replace a dressing/Band-Aid to cover the incision for comfort if you wish.      7. ACTIVITIES as tolerated:   a. You may resume regular (light) daily activities beginning the next day-such as daily self-care, walking, climbing stairs-gradually increasing activities as tolerated.  If you can walk 30 minutes without difficulty, it is safe to try more intense activity such as jogging, treadmill, bicycling, low-impact aerobics, swimming, etc. b. Save the most intensive and strenuous activity for last such as sit-ups, heavy lifting, contact sports, etc  Refrain from any heavy lifting or straining until you  are off narcotics for pain control.   c. DO NOT PUSH THROUGH PAIN.  Let pain be your guide: If it hurts to do something, don't do it.  Pain is your body warning you to avoid that activity for another week until the pain goes down. d. You may drive when you are no longer taking prescription pain medication, you can comfortably wear a seatbelt, and you can safely maneuver your car and apply  brakes. e. Dennis Bast may have sexual intercourse when it is comfortable.  8. FOLLOW UP in our office a. Please call [facility] at [phone] to set up an appointment to see your surgeon in the office for a follow-up appointment approximately 2-3 weeks after your surgery. b. Make sure that you call for this appointment the day you arrive home to insure a convenient appointment time. 9. IF YOU HAVE DISABILITY OR FAMILY LEAVE FORMS, BRING THEM TO THE OFFICE FOR PROCESSING.  DO NOT GIVE THEM TO YOUR DOCTOR.   WHEN TO CALL us [phone]: 1. Poor pain control 2. Reactions / problems with new medications (rash/itching, nausea, etc)  3. Fever over 101.5 F (38.5 C) 4. Worsening swelling or bruising 5. Continued bleeding from incision. 6. Increased pain, redness, or drainage from the incision 7. Difficulty breathing / swallowing   The clinic staff is available to answer your questions during regular business hours (8:30am-5pm).  Please don't hesitate to call and ask to speak to one of our nurses for clinical concerns.   If you have a medical emergency, go to the nearest emergency room or call 911.  A surgeon from [facility] is always on call at the [facility], [place], [place], [place], [place]  [address] ? MAIN: [phone] ? TOLL FREE: [phone] ?  FAX [phone] [email or web address]  Exercises Following Axillary (Armpit) Surgery Following all surgeries on the breast or axillary nodes, whether you had radiation treatment or not, exercises may help you return to your normal activities and way of life sooner. Before beginning any exercise, talk to your caregiver about what type of exercises will be best for you. Your caregiver may recommend getting physical therapy to help you, especially if you do not see any progress in a month of exercising. Some light exercises can be done right after the surgery, but any drains and  sutures will be removed before doing the extended or heavy exercises. Generally, the exercises will lessen problems following the surgery. You can usually expect to have full range of motion of your arm back in 4 to 6 weeks.  HOME CARE INSTRUCTIONS  These exercises should be done for the first 3 to 7 days after surgery, but only with your doctor's permission.   Use your affected arm (on the side where your surgery was) as you normally would when combing your hair, bathing, dressing and eating.  Raise your affected arm above the level of your heart for 45 minutes, 2 to 3 times a day, while lying down. Put your arm on pillows so that your hand is higher than your wrist and your elbow is a little higher than your shoulder. This will help decrease the swelling that may happen after surgery.  Exercise your affected arm while it is elevated above the level of your heart by opening and closing your hand 15 to 25 times. Then, bend and straighten your elbow. Repeat this 3 to 4 times a day. This exercise helps reduce swelling  by pumping lymph fluid out of your arm.  Practice deep breathing exercises (using your diaphragm) at least 6 times each day. While lying on your back, take a slow, deep breath. Breathe in as much air as you can while trying to expand your chest and abdomen (push your belly button away from your spine). Relax and breathe out. Repeat this 4 or 5 times. This exercise will help maintain normal movement of your chest, making it easier for your lungs to expand. Continue to do deep breathing exercises from now on.  Avoid sleeping on your affected arm or on that side.  Do not lift anything over 5 pounds.  Stop exercising if you develop pain in your chest, arm or shoulder, and call your caregiver.  Let your caregiver or therapist know if your arm becomes swollen after exercising.  Exercise in front of a mirror. This way you can see yourself exercising in a correct posture and using the correct  motion that is recommended.  Do not use a heating pad on your arm of the side of the surgery. GENERAL GUIDELINES FOR EXERCISE The exercises described here can be done as soon as your doctor gives you permission. Be sure to talk to your doctor before trying any of them.   You will feel some tightness in your chest and armpit after surgery. This is normal. The tightness will decrease as you continue your exercise program.  Many women have a burning, tingling, numbness, or soreness on the back of the arm and/or chest wall. This is because the surgery irritated some of your nerve endings. Although the sensations may increase a few weeks after surgery, continue to do the exercises unless you notice unusual swelling or tenderness. (Tell your caregiver if this occurs.) Sometimes rubbing or stroking the area with your hand or a soft cloth can help make the area less sensitive.  It may be helpful to do exercises after a warm shower when muscles are warm and relaxed.  Wear comfortable, loose clothing when doing the exercises.  Do the exercises until you feel a slow stretch. Hold each stretch at the end of the motion for a count of five. It is normal to feel some pulling as you stretch the skin and muscles that have been shortened because of the surgery. Do not do bouncing or jerky-type movements when doing any of the exercises. You should not feel pain as you do the exercises, only gentle stretching.  Do 5 to 7 repetitions of each exercise. Try to do each exercise correctly. If you have difficulty with the exercises, contact your doctor. You may need to be referred to a physical or occupational therapist.  Exercises should be done twice a day until you regain normal flexibility and strength.  Be sure to take deep breaths, in and out, as you perform each exercise.  The exercises are designed so that you begin them lying down, move to sitting, and then finish standing. EXERCISES IN LYING POSITION These  exercises should be performed on a bed or on the floor while lying on your back. Keep your knees and hips bent and feet flat.  Wand Exercise This exercise helps increase the forward motion of the shoulders. You will need a broom handle, yardstick, or other similar object to perform this exercise.   Hold the wand in both hands with palms facing up.  Lift the wand up over your head (as far as you can) using your unaffected arm to help lift the wand,  until you feel a stretch in your affected arm.  Hold for five seconds.  Lower arms and repeat 5 to 7 times. Elbow Winging This exercise helps increase the mobility of the front of your chest and shoulder. It may take several weeks of regular exercise before your elbows will get close to the bed (or floor).   Clasp your hands behind your neck with your elbows pointing toward the ceiling.  Move your elbows apart and down toward the bed (or floor).  Repeat 5 to 7 times. EXERCISES IN SITTING POSITION Shoulder Blade Stretch This exercise helps increase the mobility of the shoulder blades.   Sit in a chair very close to a table.  Place the unaffected arm on the table with your elbow bent and palm down. Do not move this arm during the exercise.  Place the affected arm on the table, palm down with your elbow straight.  Without moving your trunk, slide the affected arm toward the opposite side of the table. You should feel your shoulder blade move as you do this.  Relax your arm and repeat 5 to 7 times. Shoulder Blade Squeeze This exercise also helps increase the mobility of the shoulder blade.   Facing straight ahead, sit in a chair in front of a mirror without resting on the back of the chair.  Arms should be at your sides with elbows bent.  Squeeze shoulder blades together, bringing your elbows behind you. Keep your shoulders level as you do this exercise. Do not lift your shoulders up toward your ears.  Return to the starting position  and repeat 5 to 7 times. Side Bending This exercise helps increase the mobility of the trunk/body.   Clasp your hands together in front of you and lift your arms slowly over your head, straightening your arms.  When your arms are over your head, bend your trunk to the right while bending at the waist and keeping your arms overhead.  Return to the starting position and bend to the left.  Repeat 5 to 7 times. EXERCISES IN STANDING POSITION Chest Wall Stretch This exercise helps stretch the chest wall.   Stand facing a corner with toes approximately 8 to 10 inches from the corner.  Bend your elbows and place forearms on the wall, one on each side of the corner. Your elbows should be as close to shoulder height as possible.  Keep your arms and feet in position and move your chest toward the corner. You will feel a stretch across your chest and shoulders.  Return to starting position and repeat 5 to 7 times. Shoulder Stretch This exercise helps increase the mobility in the shoulder.   Stand facing the wall with your toes approximately 8 to 10 inches from the wall.  Place your hands on the wall. Use your fingers to "climb the wall," reaching as high as you can until you feel a stretch.  Return to starting position and repeat 5 to 7 times. THINGS TO KEEP IN MIND  Begin exercising slowly and keep going as you are able. Stop exercising and call your caregiver if you:  Get weaker, start losing your balance or start falling.  Have pain that gets worse.  Have new heaviness in your arm.  Have unusual swelling, or swelling gets worse.  Have headaches, dizziness, blurred vision, new numbness or tingling in arms or chest. It is important to exercise to keep muscles working as well as possible, but it is also important to be  safe. Talk with your caregiver about realistic exercises for your condition. Then set goals for increasing your physical activity level.  Document Released: 07/05/2006  Document Revised: 04/09/2013 Document Reviewed: 01/27/2009 [facility] Patient Information 2014 [place].

## 2014-06-03 NOTE — Progress Notes (Signed)
Subjective:     Patient ID: [name] [name], male   DOB: 19-Sep-1941, 73 y.o.   MRN: [id number]  HPI  Note: This dictation was prepared with Dragon/digital dictation along with [employer] technology. Any transcriptional errors that result from this process are unintentional.       [name] [name]  10-Apr-1941 [id number]  Patient Care Team: Mosie Lukes, MD as PCP - General (Family Medicine) Gatha Mayer, MD as Consulting Physician (Gastroenterology) Bonnita Levan, MD as Consulting Physician (Dermatology)  Procedure (Date: 05/16/2014):  POST-OPERATIVE DIAGNOSIS:  right chest wall subfacial mass adherent to rib  right anterior chest wall subcutaneous mass  posterior neck subcutaneous mass   PROCEDURE: Procedure(s):  EXCISION POSTERIOR NECK MASS  EXCISION OF RIGHT CHEST WALL MASS WITH RESECTION OF ANTERIOR RIB  RIGHT AXILLARY MASS excision with AXILLARY NODE DISSECTION   SURGEON: Surgeon(s):  Adin Hector, MD   Diagnosis 1. Soft tissue mass, simple excision, posterior neck mass - BENIGN ADIPOSE TISSUE CONSISTENT WITH LIPOMA. 2. Soft tissue mass, simple excision, right chest wall mass - BENIGN ADIPOSE TISSUE CONSISTENT WITH LIPOMA. 3. Soft tissue mass, simple excision, right axillary mass - SIX LYMPH NODES WITH TWO HAVING CAPSULAR NEVI. - NO METASTATIC MELANOMA IDENTIFIED. 4. Soft tissue mass, simple excision, deep chest wall mass with rib - BENIGN ADIPOSE TISSUE CONSISTENT WITH LIPOMA. - BENIGN BONE AND MARROW TISSUE CONSISTENT WITH RIB. - NO EVIDENCE OF MALIGNANCY.   This patient returns for surgical re-evaluation.  He comes today with his wife.  Drainage of had gone down on the 2 drains.  The higher output one was down to about 30 a day when he woke up to find it it fallen out.  The lower output drain has had minimal output.  He kept most of the OR dressings still on.  They recall being told to keep them on.  (!?)   He still has sensitivity like there is a knot on  his right shoulder blade consistent with his postherpetic shingles neuralgia.  Was getting better initially but latlely been a little worse.  Mild enough to be controlled by using gabapentin only.  Not needingnarcotics.  Working out of in the yard, moderately active.  Doing the gentle stretch exercises on the right shoulder/armpit.  Some pulling on the dressings but overall getting better.  His wife agrees.  Patient Active Problem List   Diagnosis Date Noted  . Melanoma of back   . Mass of right chest wall 05/16/2014  . Mass of chest wall, right subfacial, 12cm s/p removal w rib resection 05/16/2014 03/08/2014  . Mass of posterior neck, SQ 4cm 03/08/2014  . Chest wall mass - right SQ anterior 5cm 08/21/2013  . Post herpetic neuralgia 07/09/2013  . Blepharitis of right lower eyelid 08/03/2012  . Sleep apnea 07/30/2011  . ELEVATED PROSTATE SPECIFIC ANTIGEN 01/13/2010  . LIPOMA, SKIN 08/29/2008  . GERD 08/29/2008  . BENIGN PROSTATIC HYPERTROPHY 08/29/2008  . SHOULDER PAIN, LEFT 08/29/2008  . ELEVATED BP READING WITHOUT DX HYPERTENSION 08/29/2008    Past Medical History  Diagnosis Date  . GERD (gastroesophageal reflux disease)     controlled w/ diet and behavioral changes  . BPH (benign prostatic hypertrophy)   . Elevated PSA   . White coat hypertension   . Shingles 07/09/2013  . Encounter for Medicare annual wellness exam 09/22/2013    Sees Dr Wilhemina Bonito of Derm Sees Dr Silvano Rusk of Gastroenterology Sees Dr Kathie Rhodes of [facility] Sees Dr  Sushmita DeAllen of [facility]  . Snoring disorder 07/30/2011  . Melanoma of back     Excised Dr Wilhemina Bonito  . Sleep apnea 07/30/2011    cpap- 13     Past Surgical History  Procedure Laterality Date  . Mass excision N/A 05/16/2014    Procedure: EXCISION POSTERIOR NECK MASS, RIGHT CHEST WALL MASS AND RIGHT AXILLARY MASS AXILLARY NODE DISSECTION;  Surgeon: Adin Hector, MD;  Location: [facility];  Service: General;  Laterality: N/A;    History     Social History  . Marital Status: Married    Spouse Name: [name]    Number of Children: 2  . Years of Education: N/A   Occupational History  . retired     Administrator   Social History Main Topics  . Smoking status: Former Smoker    Quit date: 02/11/1989  . Smokeless tobacco: Never Used  . Alcohol Use: 4.2 oz/week    7 Shots of liquor per week     Comment: 1 per day  . Drug Use: No  . Sexual Activity: Not on file   Other Topics Concern  . Not on file   Social History Narrative  . No narrative on file    Family History  Problem Relation Age of Onset  . Cancer Mother     MM, leukemia  . Emphysema Father   . Cancer Sister     brain  . Alcohol abuse Other   . COPD Brother   . Heart disease Paternal Grandmother   . COPD Paternal Grandfather   . Diabetes Sister   . Obesity Sister   . Down syndrome Brother   . Colon cancer Neg Hx     Current Outpatient Prescriptions  Medication Sig Dispense Refill  . aspirin 81 MG chewable tablet Chew 81 mg by mouth every Monday, Wednesday, and Friday.       . gabapentin (NEURONTIN) 300 MG capsule Take 3 capsules by mouth three times a day.  270 capsule  2  . KRILL OIL PO Take 1 capsule by mouth daily.      . Multiple Vitamin (MULTIVITAMIN WITH MINERALS) TABS tablet Take 1 tablet by mouth daily.      . naproxen (NAPROSYN) 500 MG tablet Take 1 tablet (500 mg total) by mouth 2 (two) times daily with a meal.  40 tablet  1   No current facility-administered medications for this visit.     No Known Allergies  BP 126/80  Pulse 57  Temp(Src) 97.5 F (36.4 C)  Ht 5\' 10"  (1.778 m)  Wt 226 lb (102.513 kg)  BMI 32.43 kg/m2  Portable Chest Xray - Atelectasis  05/16/2014   CLINICAL DATA:  Right chest wall mass.  EXAM: PORTABLE CHEST - 1 VIEW  COMPARISON:  Chest x-ray dated 06/05/2012  FINDINGS: There is slight pulmonary vascular congestion. There is slight atelectasis at the left lung base. The patient has had partial resection of  the right third rib. There are multiple surgical clips as well as a soft tissue drain at that site. There is pleural thickening at that site.  Right lung is clear except for prominent vascularity.  IMPRESSION: No pneumothorax. Atelectasis at the left lung base. Slight pulmonary vascular prominence, felt to be due to the shallow inspiration and the semi-erect position.   Electronically Signed   By: Rozetta Nunnery M.D.   On: 05/16/2014 15:18     Review of Systems  Constitutional: Negative for fever, chills and diaphoresis.  HENT: Negative for sore throat and trouble swallowing.   Eyes: Negative for photophobia and visual disturbance.  Respiratory: Negative for choking and shortness of breath.   Cardiovascular: Negative for chest pain and palpitations.  Gastrointestinal: Negative for nausea, vomiting, abdominal distention, anal bleeding and rectal pain.  Genitourinary: Negative for dysuria, urgency, difficulty urinating and testicular pain.  Musculoskeletal: Positive for myalgias. Negative for arthralgias, back pain, gait problem, joint swelling, neck pain and neck stiffness.  Skin: Negative for color change and rash.  Neurological: Negative for dizziness, speech difficulty, weakness and numbness.  Hematological: Negative for adenopathy.  Psychiatric/Behavioral: Negative for hallucinations, confusion and agitation.       Objective:   Physical Exam  Constitutional: He is oriented to person, place, and time. He appears well-developed and well-nourished. No distress.  HENT:  Head: Normocephalic.  Mouth/Throat: Oropharynx is clear and moist. No oropharyngeal exudate.  Eyes: Conjunctivae and EOM are normal. Pupils are equal, round, and reactive to light. No scleral icterus.  Neck: Normal range of motion. No tracheal deviation present.    Cardiovascular: Normal rate, normal heart sounds and intact distal pulses.   Pulmonary/Chest: Effort normal. No respiratory distress.    Abdominal: Soft. He  exhibits no distension. There is no tenderness. Hernia confirmed negative in the right inguinal area and confirmed negative in the left inguinal area.  Incisions clean with normal healing ridges.  No hernias  Musculoskeletal: Normal range of motion. He exhibits no tenderness.  Neurological: He is alert and oriented to person, place, and time. No cranial nerve deficit. He exhibits normal muscle tone. Coordination normal.  Skin: Skin is warm and dry. No rash noted. He is not diaphoretic.  Psychiatric: He has a normal mood and affect. His behavior is normal.       Assessment:     Recovering relatively well status post excision of numerous lipomas, deepest on chest wall adherent to rib.     Plan:     Increase activity as tolerated to regular activity.  Low impact exercise such as walking an hour a day at least ideal.  Do not push through pain.  Continue exercises to help stretch out axilla.  I am glad there was no evidence of malignancy nor recurrent melanoma.  Copy of pathology given to the patient's wife.  Okay to protect areas of postherpetic shingles neuralgia as needed.  Gabapentin as previously Rx'd.  Hopefully this will improve over time.  I would give in 3 months before seeing what his new baseline pain is.  Diet as tolerated.  Low fat high fiber diet ideal.  Bowel regimen with 30 g fiber a day and fiber supplement as needed to avoid problems.  Return to clinic 3-4 weeks, sooner as needed.   Instructions discussed.  Followup with primary care physician for other health issues as would normally be done.  Consider screening for malignancies (breast, prostate, colon, melanoma, etc) as appropriate.  Questions answered.  The patient expressed understanding and appreciation

## 2014-06-19 ENCOUNTER — Encounter (INDEPENDENT_AMBULATORY_CARE_PROVIDER_SITE_OTHER): Payer: Self-pay | Admitting: Surgery

## 2014-06-19 ENCOUNTER — Ambulatory Visit (INDEPENDENT_AMBULATORY_CARE_PROVIDER_SITE_OTHER): Payer: Medicare Other | Admitting: Surgery

## 2014-06-19 VITALS — BP 142/84 | HR 60 | Temp 97.9°F | Resp 16 | Ht 70.0 in | Wt 232.0 lb

## 2014-06-19 DIAGNOSIS — B0229 Other postherpetic nervous system involvement: Secondary | ICD-10-CM

## 2014-06-19 DIAGNOSIS — R220 Localized swelling, mass and lump, head: Secondary | ICD-10-CM

## 2014-06-19 DIAGNOSIS — R222 Localized swelling, mass and lump, trunk: Secondary | ICD-10-CM

## 2014-06-19 DIAGNOSIS — R221 Localized swelling, mass and lump, neck: Secondary | ICD-10-CM

## 2014-06-19 NOTE — Patient Instructions (Signed)
Postherpetic Neuralgia Shingles is a painful disease. It is caused by the herpes zoster virus. This is the same virus which also causes chickenpox. It can affect the torso, limbs, or the face. For most people, shingles is a condition of rather sudden onset. Pain usually lasts about 1 month. In older patients, or patients with poor immune systems, a painful, long-standing (chronic) condition called postherpetic neuralgia can develop. This condition rarely happens before age 6. But at least 50% of people over 50 become affected following an attack of shingles. There is a natural tendency for this condition to improve over time with no treatment. Less than 5% of patients have pain that lasts for more than 1 year. DIAGNOSIS  Herpes is usually easily diagnosed on physical exam. Pain sometimes follows when the skin sores (lesions) have disappeared. It is called postherpetic neuralgia. That name simply means the pain that follows herpes. TREATMENT   Treating this condition may be difficult. Usually one of the tricyclic antidepressants, often amitriptyline, is the first line of treatment. There is evidence that the sooner these medications are given, the more likely they are to reduce pain.  Conventional analgesics, regional nerve blocks, and anticonvulsants have little benefit in most cases when used alone. Other tricyclic anti-depressants are used as a second option if the first antidepressant is unsuccessful.  Anticonvulsants, including carbamazepine, have been found to provide some added benefit when used with a tricyclic anti-depressant. This is especially for the stabbing type of pain similar to that of trigeminal neuralgia.  Chronic opioid therapy. This is a strong narcotic pain medication. It is used to treat pain that is resistant to other measures. The issues of dependency and tolerance can be reduced with closely managed care.  Some cream treatments are applied locally to the affected area. They  can help when used with other treatments. Their use may be difficult in the case of postherpetic trigeminal neuralgia. This is involved with the face. So the substances can irritate the eye and the skin around the eye. Examples of creams used include Capsaicin and lidocaine creams.  For shingles, antiviral therapies along with analgesics are recommended. Studies of the effect of anti-viral agents such as acyclovir on shingles have been done. They show improved rates of healing and decreased severity of sudden (acute) pain. Some observations suggest that nerve blocks during shingles infection will:  Reduce pain.  Shorten the acute episode.  Prevent the emergence of postherpetic neuralgia. Viral medications used include Acyclovir (Zovirax), Valacyclovir, Famciclovir and a lysine diet. Document Released: 03/05/2003 Document Revised: 03/06/2012 Document Reviewed: 12/04/2013 [facility] Patient Information 2015 [place], [place]. This information is not intended to replace advice given to you by your health care provider. Make sure you discuss any questions you have with your health care provider.   Managing Pain  Pain after surgery or related to activity is often due to strain/injury to muscle, tendon, nerves and/or incisions.  This pain is usually short-term and will improve in a few months.   Many people find it helpful to do the following things TOGETHER to help speed the process of healing and to get back to regular activity more quickly:  1. Avoid heavy physical activity a.  no lifting greater than 20 pounds b. Do not "push through" the pain.  Listen to your body and avoid positions and maneuvers than reproduce the pain c. Walking is okay as tolerated, but go slowly and stop when getting sore.  d. Remember: If it hurts to do it, then don't  do it! 2. Take Anti-inflammatory medication  a. Take with food/snack around the clock for 1-2 weeks i. This helps the muscle and nerve tissues become less  irritable and calm down faster b. Choose ONE of the following over-the-counter medications: i. Naproxen 220mg  tabs (ex. Aleve) 1-2 pills twice a day  ii. Ibuprofen 200mg  tabs (ex. Advil, Motrin) 3-4 pills with every meal and just before bedtime iii. Acetaminophen 500mg  tabs (Tylenol) 1-2 pills with every meal and just before bedtime 3. Use a Heating pad or Ice/Cold Pack a. 4-6 times a day b. May use warm bath/hottub  or showers 4. Try Gentle Massage and/or Stretching  a. at the area of pain many times a day b. stop if you feel pain - do not overdo it  Try these steps together to help you body heal faster and avoid making things get worse.  Doing just one of these things may not be enough.    If you are not getting better after two weeks or are noticing you are getting worse, contact our office for further advice; we may need to re-evaluate you & see what other things we can do to help.

## 2014-06-19 NOTE — Progress Notes (Signed)
Subjective:     Patient ID: [name] [name], male   DOB: [birth year], 73 y.o.   MRN: [id number]  HPI   Note: This dictation was prepared with Dragon/digital dictation along with [facility] technology. Any transcriptional errors that result from this process are unintentional.       [name] [name]  04-Apr-1941 [id number]  Patient Care Team: Mosie Lukes, MD as PCP - General (Family Medicine) Gatha Mayer, MD as Consulting Physician (Gastroenterology) Bonnita Levan, MD as Consulting Physician (Dermatology)  Procedure (Date: 05/16/2014):  POST-OPERATIVE DIAGNOSIS:  right chest wall subfacial mass adherent to rib  right anterior chest wall subcutaneous mass  posterior neck subcutaneous mass   PROCEDURE: Procedure(s):  EXCISION POSTERIOR NECK MASS  EXCISION OF RIGHT CHEST WALL MASS WITH RESECTION OF ANTERIOR RIB  RIGHT AXILLARY MASS excision with AXILLARY NODE DISSECTION   SURGEON: Surgeon(s):  Adin Hector, MD   Diagnosis 1. Soft tissue mass, simple excision, posterior neck mass - BENIGN ADIPOSE TISSUE CONSISTENT WITH LIPOMA. 2. Soft tissue mass, simple excision, right chest wall mass - BENIGN ADIPOSE TISSUE CONSISTENT WITH LIPOMA. 3. Soft tissue mass, simple excision, right axillary mass - SIX LYMPH NODES WITH TWO HAVING CAPSULAR NEVI. - NO METASTATIC MELANOMA IDENTIFIED. 4. Soft tissue mass, simple excision, deep chest wall mass with rib - BENIGN ADIPOSE TISSUE CONSISTENT WITH LIPOMA. - BENIGN BONE AND MARROW TISSUE CONSISTENT WITH RIB. - NO EVIDENCE OF MALIGNANCY.   This patient returns for surgical re-evaluation.  He comes today with his wife.   He still has sensitivity like there is a knot on the center of his right shoulder blade consistent with his postherpetic shingles neuralgia.  Was getting better initially but latlely been a little worse.  Mild enough to be controlled by using gabapentin 3-4x/day only.  Not needing narcotics - doesn't help that.  Working  out of in the yard, moderately active.  Doing the gentle stretch exercises on the right shoulder/armpit.  Some pulling on the dressings but overall getting better.  His wife agrees.  Patient Active Problem List   Diagnosis Date Noted  . Melanoma of back   . Mass of right chest wall 05/16/2014  . Mass of chest wall, right subfacial, 12cm s/p removal w rib resection 05/16/2014 03/08/2014  . Mass of posterior neck, SQ 4cm 03/08/2014  . Chest wall mass - right SQ anterior 5cm 08/21/2013  . Post herpetic neuralgia 07/09/2013  . Blepharitis of right lower eyelid 08/03/2012  . Sleep apnea 07/30/2011  . ELEVATED PROSTATE SPECIFIC ANTIGEN 01/13/2010  . LIPOMA, SKIN 08/29/2008  . GERD 08/29/2008  . BENIGN PROSTATIC HYPERTROPHY 08/29/2008  . SHOULDER PAIN, LEFT 08/29/2008  . ELEVATED BP READING WITHOUT DX HYPERTENSION 08/29/2008    Past Medical History  Diagnosis Date  . GERD (gastroesophageal reflux disease)     controlled w/ diet and behavioral changes  . BPH (benign prostatic hypertrophy)   . Elevated PSA   . White coat hypertension   . Shingles 07/09/2013  . Encounter for Medicare annual wellness exam 09/22/2013    Sees Dr Wilhemina Bonito of Derm Sees Dr Silvano Rusk of Gastroenterology Sees Dr Kathie Rhodes of [facility] Sees Dr Susa Day of Optometry  . Snoring disorder 07/30/2011  . Melanoma of back     Excised Dr Wilhemina Bonito  . Sleep apnea 07/30/2011    cpap- 13     Past Surgical History  Procedure Laterality Date  . Mass excision N/A 05/16/2014  Procedure: EXCISION POSTERIOR NECK MASS, RIGHT CHEST WALL MASS AND RIGHT AXILLARY MASS AXILLARY NODE DISSECTION;  Surgeon: Adin Hector, MD;  Location: [facility];  Service: General;  Laterality: N/A;    History   Social History  . Marital Status: Married    Spouse Name: [name] [name]    Number of Children: 2  . Years of Education: N/A   Occupational History  . retired     Administrator   Social History Main Topics  . Smoking  status: Former Smoker    Quit date: 02/11/1989  . Smokeless tobacco: Never Used  . Alcohol Use: 4.2 oz/week    7 Shots of liquor per week     Comment: 1 per day  . Drug Use: No  . Sexual Activity: Not on file   Other Topics Concern  . Not on file   Social History Narrative  . No narrative on file    Family History  Problem Relation Age of Onset  . Cancer Mother     MM, leukemia  . Emphysema Father   . Cancer Sister     brain  . Alcohol abuse Other   . COPD Brother   . Heart disease Paternal Grandmother   . COPD Paternal Grandfather   . Diabetes Sister   . Obesity Sister   . Down syndrome Brother   . Colon cancer Neg Hx     Current Outpatient Prescriptions  Medication Sig Dispense Refill  . aspirin 81 MG chewable tablet Chew 81 mg by mouth every Monday, Wednesday, and Friday.       . gabapentin (NEURONTIN) 300 MG capsule Take 3 capsules by mouth three times a day.  270 capsule  2  . KRILL OIL PO Take 1 capsule by mouth daily.      . Multiple Vitamin (MULTIVITAMIN WITH MINERALS) TABS tablet Take 1 tablet by mouth daily.       No current facility-administered medications for this visit.     No Known Allergies  BP 142/84  Pulse 60  Temp(Src) 97.9 F (36.6 C) (Temporal)  Resp 16  Ht 5\' 10"  (1.778 m)  Wt 232 lb (105.235 kg)  BMI 33.29 kg/m2  Portable Chest Xray - Atelectasis  05/16/2014   CLINICAL DATA:  Right chest wall mass.  EXAM: PORTABLE CHEST - 1 VIEW  COMPARISON:  Chest x-ray dated 06/05/2012  FINDINGS: There is slight pulmonary vascular congestion. There is slight atelectasis at the left lung base. The patient has had partial resection of the right third rib. There are multiple surgical clips as well as a soft tissue drain at that site. There is pleural thickening at that site.  Right lung is clear except for prominent vascularity.  IMPRESSION: No pneumothorax. Atelectasis at the left lung base. Slight pulmonary vascular prominence, felt to be due to the  shallow inspiration and the semi-erect position.   Electronically Signed   By: Rozetta Nunnery M.D.   On: 05/16/2014 15:18     Review of Systems  Constitutional: Negative for fever, chills and diaphoresis.  HENT: Negative for sore throat and trouble swallowing.   Eyes: Negative for photophobia and visual disturbance.  Respiratory: Negative for choking and shortness of breath.   Cardiovascular: Negative for chest pain and palpitations.  Gastrointestinal: Negative for nausea, vomiting, abdominal distention, anal bleeding and rectal pain.  Genitourinary: Negative for dysuria, urgency, difficulty urinating and testicular pain.  Musculoskeletal: Positive for myalgias. Negative for arthralgias, back pain, gait problem, joint swelling, neck pain  and neck stiffness.  Skin: Negative for color change and rash.  Neurological: Negative for dizziness, speech difficulty, weakness and numbness.  Hematological: Negative for adenopathy.  Psychiatric/Behavioral: Negative for hallucinations, confusion and agitation.       Objective:   Physical Exam  Constitutional: He is oriented to person, place, and time. He appears well-developed and well-nourished. No distress.  HENT:  Head: Normocephalic.  Mouth/Throat: Oropharynx is clear and moist. No oropharyngeal exudate.  Eyes: Conjunctivae and EOM are normal. Pupils are equal, round, and reactive to light. No scleral icterus.  Neck: Normal range of motion. No tracheal deviation present.    Cardiovascular: Normal rate, normal heart sounds and intact distal pulses.   Pulmonary/Chest: Effort normal. No respiratory distress.    Abdominal: Soft. He exhibits no distension. There is no tenderness. Hernia confirmed negative in the right inguinal area and confirmed negative in the left inguinal area.  Incisions clean with normal healing ridges.  No hernias  Musculoskeletal: Normal range of motion. He exhibits no tenderness.  Neurological: He is alert and oriented to  person, place, and time. No cranial nerve deficit. He exhibits normal muscle tone. Coordination normal.  Skin: Skin is warm and dry. No rash noted. He is not diaphoretic.  Psychiatric: He has a normal mood and affect. His behavior is normal.       Assessment:     Recovering relatively well status post excision of numerous lipomas, deepest on chest wall adherent to rib.  Persistent postherpetic neuralgia.  Moderately controlled with gabapentin     Plan:     Increase activity as tolerated to regular activity.  Low impact exercise such as walking an hour a day at least ideal.  Do not push through pain.  Continue exercises to help stretch out axilla.  Okay to protect areas of postherpetic shingles neuralgia as needed.  Gabapentin as previously Rx'd.  Hopefully this will improve over time.  I would give in 3 months before seeing what his new baseline pain is.  If no better, consider reevaluation with his other physicians to see if switching to amitriptyline or other interventions are warranted.  Diet as tolerated.  Low fat high fiber diet ideal.  Bowel regimen with 30 g fiber a day and fiber supplement as needed to avoid problems.  Return to clinic as needed.   Instructions discussed.  Followup with primary care physician for other health issues as would normally be done.  Consider screening for malignancies (breast, prostate, colon, melanoma, etc) as appropriate.  Questions answered.  The patient expressed understanding and appreciation

## 2014-06-20 ENCOUNTER — Ambulatory Visit: Payer: Medicare Other | Admitting: Family Medicine

## 2014-06-20 ENCOUNTER — Telehealth: Payer: Self-pay | Admitting: Family Medicine

## 2014-06-20 NOTE — Telephone Encounter (Signed)
Patient needs his last OV note faxed to [facility] so he can get his Cpap supplys, please call when this is completed

## 2014-06-21 ENCOUNTER — Telehealth: Payer: Self-pay | Admitting: Family Medicine

## 2014-06-21 NOTE — Telephone Encounter (Signed)
Patient walked into the office stating that he talked to [place] and that they would not be faxing any paperwork over. Patient brought in a letter from [place]. I faxed letter and office note to [facility]

## 2014-06-21 NOTE — Telephone Encounter (Signed)
Refill- gabapentin  [facility] [address]

## 2014-06-21 NOTE — Telephone Encounter (Signed)
Please inform patient that Huey Romans needs to send [name] this information and we will fax this over.

## 2014-06-21 NOTE — Telephone Encounter (Signed)
Informed patient of this. He states he will call [employer] and have forms faxed over.

## 2014-06-24 ENCOUNTER — Ambulatory Visit: Payer: Medicare Other | Admitting: Family Medicine

## 2014-06-27 ENCOUNTER — Encounter (INDEPENDENT_AMBULATORY_CARE_PROVIDER_SITE_OTHER): Payer: Medicare Other | Admitting: Surgery

## 2014-07-19 ENCOUNTER — Telehealth: Payer: Self-pay | Admitting: Family Medicine

## 2014-07-19 MED ORDER — GABAPENTIN 300 MG PO CAPS: ORAL_CAPSULE | ORAL | Status: DC

## 2014-07-19 NOTE — Telephone Encounter (Signed)
Please call and inform pt that RX was sent

## 2014-07-19 NOTE — Telephone Encounter (Signed)
Informed patient wife that medication has been sent

## 2014-07-19 NOTE — Telephone Encounter (Signed)
Patient wife called in stating that patient is out of gabapentin. They are traveling, please send refill to [facility] [address] in [place], [place]

## 2014-08-20 ENCOUNTER — Telehealth: Payer: Self-pay | Admitting: Family Medicine

## 2014-08-20 MED ORDER — GABAPENTIN 300 MG PO CAPS: ORAL_CAPSULE | ORAL | Status: DC

## 2014-08-20 NOTE — Telephone Encounter (Signed)
Refill-gabapentin  [facility] [address]

## 2014-09-01 IMAGING — CR DG CHEST 1V PORT
1 series · 1 of 1 positions shown · non-contrast
Comparison: Chest x-ray dated 06/05/2012

CLINICAL DATA: Right chest wall mass.

EXAM:
PORTABLE CHEST - 1 VIEW

[AP]
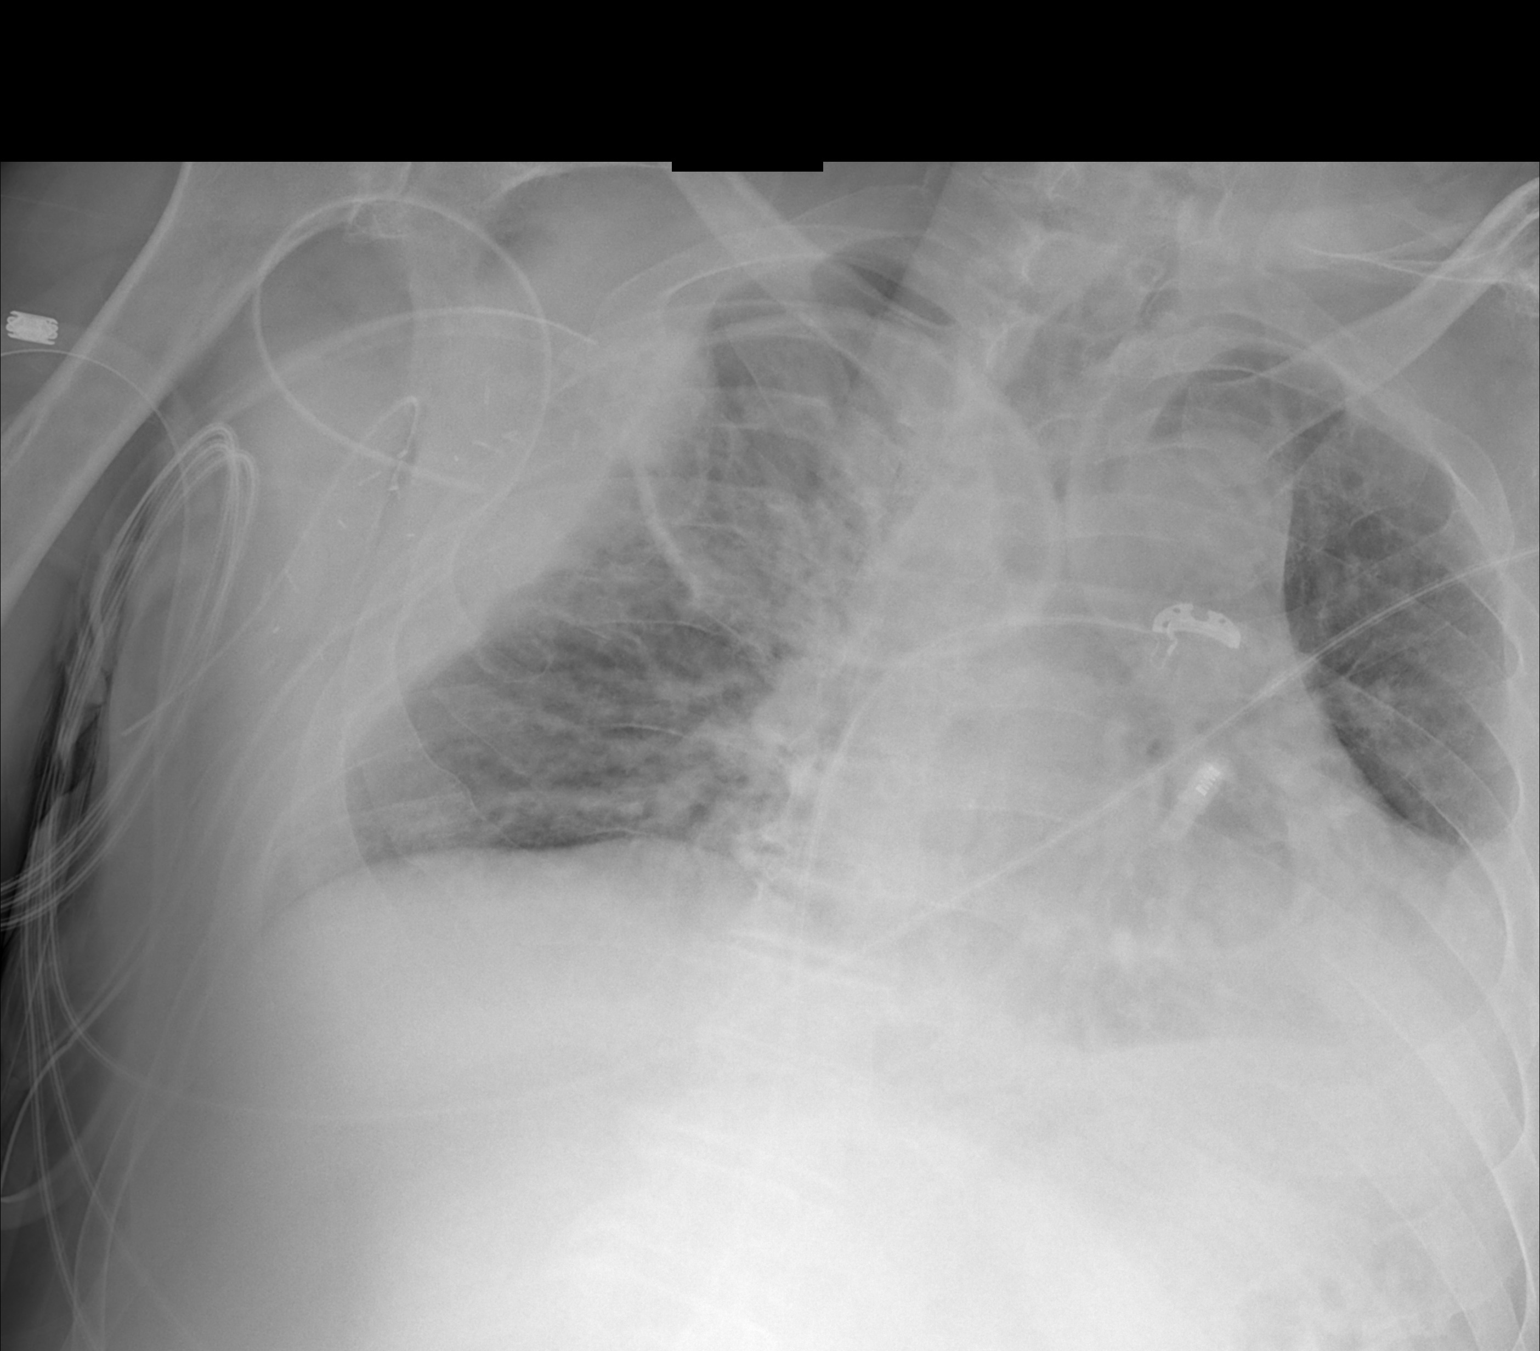

[1 of 1 positions shown; findings below may reference images not displayed]

FINDINGS: There is slight pulmonary vascular congestion. There is slight
atelectasis at the left lung base. The patient has had partial
resection of the right third rib. There are multiple surgical clips
as well as a soft tissue drain at that site. There is pleural
thickening at that site.

Right lung is clear except for prominent vascularity.
IMPRESSION: No pneumothorax. Atelectasis at the left lung base. Slight pulmonary
vascular prominence, felt to be due to the shallow inspiration and
the semi-erect position.

## 2014-09-10 ENCOUNTER — Other Ambulatory Visit: Payer: Self-pay | Admitting: Family Medicine

## 2014-09-10 NOTE — Telephone Encounter (Signed)
Please advise? Last 2 appts were cancelled

## 2014-10-08 ENCOUNTER — Ambulatory Visit: Payer: Medicare Other | Admitting: Family Medicine

## 2014-10-18 ENCOUNTER — Ambulatory Visit (INDEPENDENT_AMBULATORY_CARE_PROVIDER_SITE_OTHER): Payer: Medicare Other

## 2014-10-18 ENCOUNTER — Other Ambulatory Visit: Payer: Self-pay | Admitting: Family Medicine

## 2014-10-18 DIAGNOSIS — Z23 Encounter for immunization: Secondary | ICD-10-CM | POA: Diagnosis not present

## 2014-10-18 NOTE — Telephone Encounter (Signed)
RX sent  Last RX was done on 09-10-14 quantity 270 with 0 refills

## 2014-10-31 ENCOUNTER — Other Ambulatory Visit: Payer: Self-pay | Admitting: Dermatology

## 2014-10-31 DIAGNOSIS — D485 Neoplasm of uncertain behavior of skin: Secondary | ICD-10-CM | POA: Diagnosis not present

## 2014-10-31 DIAGNOSIS — Z85820 Personal history of malignant melanoma of skin: Secondary | ICD-10-CM | POA: Diagnosis not present

## 2014-10-31 DIAGNOSIS — D1801 Hemangioma of skin and subcutaneous tissue: Secondary | ICD-10-CM | POA: Diagnosis not present

## 2014-10-31 DIAGNOSIS — D225 Melanocytic nevi of trunk: Secondary | ICD-10-CM | POA: Diagnosis not present

## 2014-10-31 DIAGNOSIS — L080 Pyoderma: Secondary | ICD-10-CM | POA: Diagnosis not present

## 2014-10-31 DIAGNOSIS — L821 Other seborrheic keratosis: Secondary | ICD-10-CM | POA: Diagnosis not present

## 2014-11-18 ENCOUNTER — Other Ambulatory Visit: Payer: Self-pay | Admitting: Family Medicine

## 2014-11-18 NOTE — Telephone Encounter (Signed)
Appointment scheduled for 12/29

## 2014-11-18 NOTE — Telephone Encounter (Signed)
Medication Detail      Disp Refills Start End     gabapentin (NEURONTIN) 300 MG capsule 270 capsule 0 10/18/2014     Sig: TAKE THREE CAPSULES BY MOUTH THREE TIMES DAILY    E-Prescribing Status: Receipt confirmed by pharmacy (10/18/2014 1:31 PM EDT)     Pharmacy    [facility] [place], [place] - [id number]   Follow-up and Disposition  05/21/2014 AVS   Return in about 4 months (around 09/21/2014) for Medicare wellness and PE, lipid, renal, cbc, tsh, hepatic, ps, lipid, renal, cbc, tsh, hepatic, psa(.     Patient Canceled 10.13.15 appointment & labs not completed, next appt scheduled not until 03.14.16; Please Advise on refills/SLS

## 2014-11-18 NOTE — Telephone Encounter (Signed)
So I have reviewed his chart he has had a lot going on this year but he does need to be seen,  This is a mildly controlled med. i am going to approve a 30 day supply with 1 rf but then recommend a quick check in between now an January to keep him on medicine. February is a year since his annual so a quarterly visit in Dec or Jan is good.

## 2014-11-18 NOTE — Telephone Encounter (Signed)
Please call patient and schedule F/U office visit between Dec & Jan per Dr. Charlett Blake instructions Theressa Millard note below] for future refill authorizations/SLS Thanks.

## 2014-12-24 ENCOUNTER — Encounter: Payer: Self-pay | Admitting: Family Medicine

## 2014-12-24 ENCOUNTER — Ambulatory Visit (INDEPENDENT_AMBULATORY_CARE_PROVIDER_SITE_OTHER): Payer: Medicare Other | Admitting: Family Medicine

## 2014-12-24 VITALS — BP 118/76 | HR 50 | Temp 98.0°F | Ht 70.0 in | Wt 235.4 lb

## 2014-12-24 DIAGNOSIS — B0229 Other postherpetic nervous system involvement: Secondary | ICD-10-CM | POA: Diagnosis not present

## 2014-12-24 DIAGNOSIS — M546 Pain in thoracic spine: Secondary | ICD-10-CM

## 2014-12-24 DIAGNOSIS — I499 Cardiac arrhythmia, unspecified: Secondary | ICD-10-CM | POA: Diagnosis not present

## 2014-12-24 DIAGNOSIS — K219 Gastro-esophageal reflux disease without esophagitis: Secondary | ICD-10-CM | POA: Diagnosis not present

## 2014-12-24 DIAGNOSIS — F418 Other specified anxiety disorders: Secondary | ICD-10-CM | POA: Diagnosis not present

## 2014-12-24 DIAGNOSIS — R030 Elevated blood-pressure reading, without diagnosis of hypertension: Secondary | ICD-10-CM

## 2014-12-24 DIAGNOSIS — F419 Anxiety disorder, unspecified: Secondary | ICD-10-CM

## 2014-12-24 DIAGNOSIS — R001 Bradycardia, unspecified: Secondary | ICD-10-CM | POA: Diagnosis not present

## 2014-12-24 DIAGNOSIS — F329 Major depressive disorder, single episode, unspecified: Secondary | ICD-10-CM

## 2014-12-24 MED ORDER — DULOXETINE HCL 30 MG PO CPEP: 30.0000 mg | ORAL_CAPSULE | Freq: Every day | ORAL | Status: DC

## 2014-12-24 MED ORDER — METHOCARBAMOL 500 MG PO TABS: 500.0000 mg | ORAL_TABLET | Freq: Three times a day (TID) | ORAL | Status: DC | PRN

## 2014-12-24 MED ORDER — DULOXETINE HCL 60 MG PO CPEP: 60.0000 mg | ORAL_CAPSULE | Freq: Every day | ORAL | Status: DC

## 2014-12-24 NOTE — Progress Notes (Signed)
Pre visit review using our clinic review tool, if applicable. No additional management support is needed unless otherwise documented below in the visit note. 

## 2014-12-24 NOTE — Progress Notes (Signed)
[name] [name]  [id number] 21-Mar-1941 12/24/2014      Progress Note-Follow Up  Subjective  Chief Complaint  Chief Complaint  Patient presents with  . Follow-up    6 mos    HPI  Patient is a 73 y.o. male in today for routine medical care. Increase in today accompanied by his wife complaining of worsening and difficult to tolerate chest wall pain. He has struggled with shingles and his surgical movement of chest wall mass both right and struggled with daily pain. Despite Gabapentin and topical treatments his pain is becoming debilitating. No recent acute illness. Denies palp/SOB/HA/congestion/fevers/GI or GU c/o. Taking meds as prescribed  Past Medical History  Diagnosis Date  . GERD (gastroesophageal reflux disease)     controlled w/ diet and behavioral changes  . BPH (benign prostatic hypertrophy)   . Elevated PSA   . White coat hypertension   . Shingles 07/09/2013  . Encounter for Medicare annual wellness exam 09/22/2013    Sees Dr Wilhemina Bonito of Derm Sees Dr Silvano Rusk of Gastroenterology Sees Dr Kathie Rhodes of [facility] Sees Dr Susa Day of Optometry  . Snoring disorder 07/30/2011  . Melanoma of back     Excised Dr Wilhemina Bonito  . Sleep apnea 07/30/2011    cpap- 13     Past Surgical History  Procedure Laterality Date  . Mass excision N/A 05/16/2014    Procedure: EXCISION POSTERIOR NECK MASS, RIGHT CHEST WALL MASS AND RIGHT AXILLARY MASS AXILLARY NODE DISSECTION;  Surgeon: Adin Hector, MD;  Location: [facility];  Service: General;  Laterality: N/A;    Family History  Problem Relation Age of Onset  . Cancer Mother     MM, leukemia  . Emphysema Father   . Cancer Sister     brain  . Alcohol abuse Other   . COPD Brother   . Heart disease Paternal Grandmother   . COPD Paternal Grandfather   . Diabetes Sister   . Obesity Sister   . Down syndrome Brother   . Colon cancer Neg Hx     History   Social History  . Marital Status: Married    Spouse Name:  [name]    Number of Children: 2  . Years of Education: N/A   Occupational History  . retired     Administrator   Social History Main Topics  . Smoking status: Former Smoker    Quit date: 02/11/1989  . Smokeless tobacco: Never Used  . Alcohol Use: 4.2 oz/week    7 Shots of liquor per week     Comment: 1 per day  . Drug Use: No  . Sexual Activity: Not on file   Other Topics Concern  . Not on file   Social History Narrative    Current Outpatient Prescriptions on File Prior to Visit  Medication Sig Dispense Refill  . aspirin 81 MG chewable tablet Chew 81 mg by mouth every Monday, Wednesday, and Friday.     . gabapentin (NEURONTIN) 300 MG capsule TAKE THREE CAPSULES BY MOUTH THREE TIMES DAILY 270 capsule 1  . KRILL OIL PO Take 1 capsule by mouth daily.    . Multiple Vitamin (MULTIVITAMIN WITH MINERALS) TABS tablet Take 1 tablet by mouth daily.     No current facility-administered medications on file prior to visit.    No Known Allergies  Review of Systems  Review of Systems  Constitutional: Negative for fever and malaise/fatigue.  HENT: Negative for congestion.  Eyes: Negative for discharge.  Respiratory: Negative for shortness of breath.   Cardiovascular: Negative for chest pain, palpitations and leg swelling.  Gastrointestinal: Negative for nausea, abdominal pain and diarrhea.  Genitourinary: Negative for dysuria.  Musculoskeletal: Positive for back pain. Negative for falls.       Right shoulder pain under scapula  Skin: Negative for rash.  Neurological: Negative for loss of consciousness and headaches.  Endo/Heme/Allergies: Negative for polydipsia.  Psychiatric/Behavioral: Negative for depression and suicidal ideas. The patient is not nervous/anxious and does not have insomnia.     Objective  BP 118/76 mmHg  Pulse 50  Temp(Src) 98 F (36.7 C) (Oral)  Ht 5\' 10"  (1.778 m)  Wt 235 lb 6.4 oz (106.777 kg)  BMI 33.78 kg/m2  SpO2 97%  Physical Exam  Physical  Exam  Lab Results  Component Value Date   TSH 2.842 09/13/2013   Lab Results  Component Value Date   WBC 7.5 05/08/2014   HGB 13.2 05/08/2014   HCT 39.3 05/08/2014   MCV 87.5 05/08/2014   PLT 220 05/08/2014   Lab Results  Component Value Date   CREATININE 0.90 02/11/2014   BUN 16 02/11/2014   NA 138 02/11/2014   K 4.5 02/11/2014   CL 105 02/11/2014   CO2 28 02/11/2014   Lab Results  Component Value Date   ALT 23 02/11/2014   AST 20 02/11/2014   ALKPHOS 54 02/11/2014   BILITOT 0.4 02/11/2014   Lab Results  Component Value Date   CHOL 157 07/20/2011   Lab Results  Component Value Date   HDL 46 07/20/2011   Lab Results  Component Value Date   LDLCALC 95 07/20/2011   Lab Results  Component Value Date   TRIG 82 07/20/2011   Lab Results  Component Value Date   CHOLHDL 3.4 07/20/2011     Assessment & Plan  GERD Avoid offending foods, start probiotics. Do not eat large meals in late evening and consider raising head of bed.   ELEVATED BP READING WITHOUT DX HYPERTENSION Well controlled, no changes to meds. Encouraged heart healthy diet such as the DASH diet and exercise as tolerated.   Bradycardia Referred to cardiology for further consideration. Is suffering with chest pain although it appears post herpetic in nature will confer with cardiology due to jhhis bradycardia.  Post herpetic neuralgia S/p removal of mass in right chest wall and is struggling with persistent pain and no longer getting adequate relief from his meds. Referred to pain management for further consideration. Continue current meds.

## 2014-12-25 ENCOUNTER — Telehealth: Payer: Self-pay | Admitting: *Deleted

## 2014-12-25 ENCOUNTER — Other Ambulatory Visit: Payer: Self-pay | Admitting: Family Medicine

## 2014-12-25 DIAGNOSIS — R001 Bradycardia, unspecified: Secondary | ICD-10-CM

## 2014-12-25 NOTE — Telephone Encounter (Signed)
Prior authorization for methocarbamol approved through [place]. JG//CMA

## 2014-12-27 DIAGNOSIS — I5189 Other ill-defined heart diseases: Secondary | ICD-10-CM

## 2014-12-27 HISTORY — DX: Other ill-defined heart diseases: I51.89

## 2014-12-30 ENCOUNTER — Encounter: Payer: Self-pay | Admitting: Family Medicine

## 2014-12-30 DIAGNOSIS — R001 Bradycardia, unspecified: Secondary | ICD-10-CM | POA: Insufficient documentation

## 2014-12-30 HISTORY — DX: Bradycardia, unspecified: R00.1

## 2014-12-30 NOTE — Assessment & Plan Note (Signed)
Referred to cardiology for further consideration. Is suffering with chest pain although it appears post herpetic in nature will confer with cardiology due to jhhis bradycardia.

## 2014-12-30 NOTE — Assessment & Plan Note (Signed)
Well controlled, no changes to meds. Encouraged heart healthy diet such as the DASH diet and exercise as tolerated.  °

## 2014-12-30 NOTE — Assessment & Plan Note (Signed)
S/p removal of mass in right chest wall and is struggling with persistent pain and no longer getting adequate relief from his meds. Referred to pain management for further consideration. Continue current meds.

## 2014-12-30 NOTE — Assessment & Plan Note (Signed)
Avoid offending foods, start probiotics. Do not eat large meals in late evening and consider raising head of bed.  

## 2015-01-22 ENCOUNTER — Other Ambulatory Visit: Payer: Self-pay | Admitting: Family Medicine

## 2015-01-24 ENCOUNTER — Ambulatory Visit: Payer: Medicare Other | Admitting: Internal Medicine

## 2015-02-11 ENCOUNTER — Encounter: Payer: Self-pay | Admitting: Physical Medicine & Rehabilitation

## 2015-02-20 ENCOUNTER — Other Ambulatory Visit: Payer: Self-pay | Admitting: Family Medicine

## 2015-02-20 NOTE — Telephone Encounter (Signed)
Requesting Neurontin 300mg -Take 3 capsules by mouth three times daily. Last refill:01/22/15'#270,0 Last OV:12/24/14 Please advise.//AB/CMA

## 2015-03-07 ENCOUNTER — Ambulatory Visit: Payer: Medicare Other | Admitting: Physical Medicine & Rehabilitation

## 2015-03-10 ENCOUNTER — Encounter: Payer: Medicare Other | Attending: Physical Medicine & Rehabilitation

## 2015-03-10 ENCOUNTER — Encounter: Payer: Self-pay | Admitting: Physical Medicine & Rehabilitation

## 2015-03-10 ENCOUNTER — Ambulatory Visit (INDEPENDENT_AMBULATORY_CARE_PROVIDER_SITE_OTHER): Payer: Medicare Other | Admitting: Family Medicine

## 2015-03-10 ENCOUNTER — Other Ambulatory Visit: Payer: Self-pay | Admitting: Physical Medicine & Rehabilitation

## 2015-03-10 ENCOUNTER — Ambulatory Visit (HOSPITAL_BASED_OUTPATIENT_CLINIC_OR_DEPARTMENT_OTHER): Payer: Medicare Other | Admitting: Physical Medicine & Rehabilitation

## 2015-03-10 ENCOUNTER — Encounter: Payer: Self-pay | Admitting: Family Medicine

## 2015-03-10 VITALS — BP 102/72 | HR 63 | Temp 97.6°F | Ht 70.0 in | Wt 226.0 lb

## 2015-03-10 VITALS — HR 68 | Resp 14

## 2015-03-10 DIAGNOSIS — R001 Bradycardia, unspecified: Secondary | ICD-10-CM

## 2015-03-10 DIAGNOSIS — R030 Elevated blood-pressure reading, without diagnosis of hypertension: Secondary | ICD-10-CM

## 2015-03-10 DIAGNOSIS — Z5181 Encounter for therapeutic drug level monitoring: Secondary | ICD-10-CM | POA: Diagnosis not present

## 2015-03-10 DIAGNOSIS — IMO0001 Reserved for inherently not codable concepts without codable children: Secondary | ICD-10-CM

## 2015-03-10 DIAGNOSIS — E781 Pure hyperglyceridemia: Secondary | ICD-10-CM | POA: Diagnosis not present

## 2015-03-10 DIAGNOSIS — B0229 Other postherpetic nervous system involvement: Secondary | ICD-10-CM

## 2015-03-10 DIAGNOSIS — R972 Elevated prostate specific antigen [PSA]: Secondary | ICD-10-CM

## 2015-03-10 DIAGNOSIS — R197 Diarrhea, unspecified: Secondary | ICD-10-CM

## 2015-03-10 DIAGNOSIS — Z0000 Encounter for general adult medical examination without abnormal findings: Secondary | ICD-10-CM

## 2015-03-10 DIAGNOSIS — Z79899 Other long term (current) drug therapy: Secondary | ICD-10-CM | POA: Insufficient documentation

## 2015-03-10 DIAGNOSIS — K219 Gastro-esophageal reflux disease without esophagitis: Secondary | ICD-10-CM | POA: Diagnosis not present

## 2015-03-10 DIAGNOSIS — I4891 Unspecified atrial fibrillation: Secondary | ICD-10-CM | POA: Diagnosis not present

## 2015-03-10 DIAGNOSIS — G894 Chronic pain syndrome: Secondary | ICD-10-CM | POA: Diagnosis not present

## 2015-03-10 DIAGNOSIS — I499 Cardiac arrhythmia, unspecified: Secondary | ICD-10-CM

## 2015-03-10 DIAGNOSIS — E669 Obesity, unspecified: Secondary | ICD-10-CM

## 2015-03-10 HISTORY — DX: Unspecified atrial fibrillation: I48.91

## 2015-03-10 LAB — TSH: TSH: 2.65 u[IU]/mL (ref 0.35–4.50)

## 2015-03-10 LAB — CBC
HCT: 43.7 % (ref 39.0–52.0)
HEMOGLOBIN: 14.6 g/dL (ref 13.0–17.0)
MCHC: 33.4 g/dL (ref 30.0–36.0)
MCV: 88.1 fl (ref 78.0–100.0)
Platelets: 246 10*3/uL (ref 150.0–400.0)
RBC: 4.96 Mil/uL (ref 4.22–5.81)
RDW: 15.5 % (ref 11.5–15.5)
WBC: 9.3 10*3/uL (ref 4.0–10.5)

## 2015-03-10 LAB — COMPREHENSIVE METABOLIC PANEL
ALT: 58 U/L — ABNORMAL HIGH (ref 0–53)
AST: 27 U/L (ref 0–37)
Albumin: 4.1 g/dL (ref 3.5–5.2)
Alkaline Phosphatase: 60 U/L (ref 39–117)
BUN: 20 mg/dL (ref 6–23)
CALCIUM: 9.6 mg/dL (ref 8.4–10.5)
CHLORIDE: 106 meq/L (ref 96–112)
CO2: 28 mEq/L (ref 19–32)
CREATININE: 1.01 mg/dL (ref 0.40–1.50)
GFR: 77.14 mL/min (ref >60.00)
GLUCOSE: 90 mg/dL (ref 70–99)
Potassium: 4.4 mEq/L (ref 3.5–5.1)
Sodium: 138 mEq/L (ref 135–145)
Total Bilirubin: 0.4 mg/dL (ref 0.2–1.2)
Total Protein: 7.1 g/dL (ref 6.0–8.3)

## 2015-03-10 LAB — LIPID PANEL
CHOLESTEROL: 158 mg/dL (ref 0–200)
HDL: 48 mg/dL (ref >39.00)
LDL Cholesterol: 79 mg/dL (ref 0–99)
NonHDL: 110
TRIGLYCERIDES: 154 mg/dL — AB (ref 0.0–149.0)
Total CHOL/HDL Ratio: 3
VLDL: 30.8 mg/dL (ref 0.0–40.0)

## 2015-03-10 NOTE — Progress Notes (Signed)
Subjective:    Patient ID: [name] [name], male    DOB: 11-Oct-1941, 74 y.o.   MRN: [id number]  HPI  74 year old male with history of shingles in June 2014. The rash was mild affected the  Posterior scapular area wrapping around the axillary area into the right pectoral area. Patient had a history of receiving the shingles vaccine 2 or 3 years prior to the episode. He was also treated with Valtrex once he had his rash. As soon as the rash cleared up patient had onset of pain. Pain has not really improved since that time. Patient has tried physical therapy treatments, electrical stimulation, and other remedies such as Tumeric and acupuncture No improvement with lidocaine patch Patient tried Lyrica as well but this was during the same time as he was taking gabapentin. Not sure about the effect. Only thing that seems to work his gabapentin. Patient can tell if he misses a dose because pain becomes more intense. Pain scores are 6/10 with medicine Gabapentin dose is 900 mg 3 times per day  Pain is worse with movement.  Pain Inventory Average Pain 6 Pain Right Now 7 My pain is intermittent, dull, stabbing and tingling  In the last 24 hours, has pain interfered with the following? General activity 2 Relation with others 2 Enjoyment of life 0 What TIME of day is your pain at its worst? evening Sleep (in general) Good  Pain is worse with: inactivity and some activites Pain improves with: medication Relief from Meds: 4  Mobility walk without assistance how many minutes can you walk? 60 ability to climb steps?  yes do you drive?  yes  Function retired  Neuro/Psych tingling  Prior Studies Any changes since last visit?  no  Physicians involved in your care Any changes since last visit?  no   Family History  Problem Relation Age of Onset  . Cancer Mother     MM, leukemia  . Emphysema Father   . Cancer Sister     brain  . Alcohol abuse Other   . COPD Brother   . Heart  disease Paternal Grandmother   . COPD Paternal Grandfather   . Diabetes Sister   . Obesity Sister   . Down syndrome Brother   . Colon cancer Neg Hx    History   Social History  . Marital Status: Married    Spouse Name: [name]  . Number of Children: 2  . Years of Education: N/A   Occupational History  . retired     Administrator   Social History Main Topics  . Smoking status: Former Smoker    Quit date: 02/11/1989  . Smokeless tobacco: Never Used  . Alcohol Use: 4.2 oz/week    7 Shots of liquor per week     Comment: 1 per day  . Drug Use: No  . Sexual Activity: Not on file     Comment: lives with wife, retired from [employer] driving, no dietary restrictions   Other Topics Concern  . None   Social History Narrative   Past Surgical History  Procedure Laterality Date  . Mass excision N/A 05/16/2014    Procedure: EXCISION POSTERIOR NECK MASS, RIGHT CHEST WALL MASS AND RIGHT AXILLARY MASS AXILLARY NODE DISSECTION;  Surgeon: Adin Hector, MD;  Location: [facility];  Service: General;  Laterality: N/A;   Past Medical History  Diagnosis Date  . GERD (gastroesophageal reflux disease)     controlled w/ diet and behavioral changes  .  BPH (benign prostatic hypertrophy)   . Elevated PSA   . White coat hypertension   . Shingles 07/09/2013  . Encounter for Medicare annual wellness exam 09/22/2013    Sees Dr Wilhemina Bonito of Derm Sees Dr Silvano Rusk of Gastroenterology Sees Dr Kathie Rhodes of [facility] Sees Dr Susa Day of Optometry  . Snoring disorder 07/30/2011  . Melanoma of back     Excised Dr Wilhemina Bonito  . Sleep apnea 07/30/2011    cpap- 13   . Bradycardia 12/30/2014   Pulse 68  Resp 14  SpO2 99%  Opioid Risk Score:   Fall Risk Score: Low Fall Risk (0-5 points)  Review of Systems  Constitutional: Negative.   HENT: Negative.   Eyes: Negative.   Respiratory: Negative.   Cardiovascular:       Bleed easy  Endocrine: Negative.   Genitourinary: Negative.     Musculoskeletal: Positive for back pain.       Right shoulder, back  Skin: Negative.   Allergic/Immunologic: Negative.   Neurological:       [place]  Hematological: Negative.   Psychiatric/Behavioral: Negative.        Objective:   Physical Exam  Constitutional: He is oriented to person, place, and time. He appears well-developed and well-nourished.  HENT:  Head: Normocephalic and atraumatic.  Eyes: Conjunctivae and EOM are normal. Pupils are equal, round, and reactive to light.  Neck: Normal range of motion.  Musculoskeletal:  No pain with shoulder range of motion, negative impingement signs Neck range of motion is within functional limits no pain in the right arm with right foraminal compression   Neurological: He is alert and oriented to person, place, and time. He has normal strength. He displays no atrophy. A sensory deficit is present. Gait normal.  Reflex Scores:      Tricep reflexes are 2+ on the right side and 2+ on the left side.      Bicep reflexes are 2+ on the right side and 2+ on the left side.      Brachioradialis reflexes are 2+ on the right side and 2+ on the left side. Sensation reduced in the right T2-T3 and T4 nerve root distribution Motor strength is 5/5 bilateral deltoids, biceps, triceps, grip Lower extremity strength is 5/5 bilateral hip flexor and knee extensor ankle dorsiflexor and plantar flexor  Skin:  Healed chest tube incisions in the right anterior axillary line mid ribs  Healed lipoma excisions in the right upper axillary area along the chest wall nontender to palpation no scars hypersensitivity. There is paresthesias with stroking the skin in the right T2 T3 T4 distribution  Psychiatric: He has a normal mood and affect.  Nursing note and vitals reviewed.         Assessment & Plan:  1. Postherpetic neuralgia right T2 T3 T4 dermatomal distribution. Do not suspect any underlying thoracic disc or thoracic radiculopathy based on lack of pain  with movement No neurologic signs with the exception of sensory abnormalities listed above  Patient has good relief with gabapentin however he is wondering if there is any other type of medications or treatments that might be more effective. We did discuss that given the 2 year duration of symptoms I would not expect any spontaneous resolution with time. We discussed other treatment options which include: 1. Thoracic epidural injection this would be done under fluoroscopic guidance. Would need to refer the patient to note a practitioner for this. We discussed the potential complications of bleeding  bruising and infection including paralysis. We also discussed that if he needs to be placed on anticoagulation for atrial fibrillation, this would need to be stopped with cardiology's approval prior to an epidural injection  2. There is a long acting form of gabapentin called Gralise, Would recommend pricing this at the pharmacy first since this is not available as a generic. I would estimate a dose of 1800 mg per day given his current large dose of short acting gabapentin  3. We discussed the possibility of trialing Lyrica. Given his large dose of gabapentin would need to wean off of this as we are increasing the dose of the Lyrica. This could take a couple months given his current dosing. He will also check his pharmacy benefits for this medication to see what out of pocket expenses would be  Patient will call once he decides on which treatment option he like to pursue he states that at least for now he will check on pricing of both Gralise as well as Lyrica While tricyclic antidepressants may be helpful in this situation I would not give it to him because of his recently diagnosed atrial fibrillation.

## 2015-03-10 NOTE — Patient Instructions (Signed)
Treatment options for post herpetic neuralgia at T2-T3-T4  Epidural injection, I would have to refer you to another doctor to perform this procedure, if you are on blood thinners these would have to be discontinued prior to the procedure   Lyrica can be used in place of gabapentin, we would likely have to use a fairly large dose given the fact that you are on a high-dose of gabapentin, I would think your dose would be at least 150 mg 3 times per day  Gralise is long acting gabapentin, this is also not a generic drug and may cost more at the pharmacy. I would estimate a dose of 1800 mg once a day  Please call me back if you wish to pursue one of these options after U check at the pharmacy in terms of drug cost

## 2015-03-10 NOTE — Progress Notes (Signed)
Pre visit review using our clinic review tool, if applicable. No additional management support is needed unless otherwise documented below in the visit note. 

## 2015-03-10 NOTE — Patient Instructions (Signed)

## 2015-03-11 LAB — PRESCRIPTION MONITORING PROFILE (SOLSTAS)
Amphetamine/Meth: NEGATIVE ng/mL
BARBITURATE SCREEN, URINE: NEGATIVE ng/mL
BENZODIAZEPINE SCREEN, URINE: NEGATIVE ng/mL
BUPRENORPHINE, URINE: NEGATIVE ng/mL
CANNABINOID SCRN UR: NEGATIVE ng/mL
CARISOPRODOL, URINE: NEGATIVE ng/mL
COCAINE METABOLITES: NEGATIVE ng/mL
CREATININE, URINE: 125 mg/dL (ref >20.0)
ECSTASY: NEGATIVE ng/mL
FENTANYL URINE: NEGATIVE ng/mL
MEPERIDINE UR: NEGATIVE ng/mL
Methadone Screen, Urine: NEGATIVE ng/mL
Nitrites, Initial: NEGATIVE ug/mL
OPIATE SCREEN, URINE: NEGATIVE ng/mL
Oxycodone Screen, Ur: NEGATIVE ng/mL
Propoxyphene: NEGATIVE ng/mL
Tapentadol, urine: NEGATIVE ng/mL
Tramadol Scrn, Ur: NEGATIVE ng/mL
ZOLPIDEM, URINE: NEGATIVE ng/mL
pH, Initial: 5.1 pH (ref 4.5–8.9)

## 2015-03-11 LAB — PMP ALCOHOL METABOLITE (ETG): Ethyl Glucuronide (EtG): NEGATIVE ng/mL

## 2015-03-13 DIAGNOSIS — H2513 Age-related nuclear cataract, bilateral: Secondary | ICD-10-CM | POA: Diagnosis not present

## 2015-03-16 ENCOUNTER — Encounter: Payer: Self-pay | Admitting: Family Medicine

## 2015-03-16 DIAGNOSIS — Z0000 Encounter for general adult medical examination without abnormal findings: Secondary | ICD-10-CM | POA: Insufficient documentation

## 2015-03-16 DIAGNOSIS — I4821 Permanent atrial fibrillation: Secondary | ICD-10-CM | POA: Insufficient documentation

## 2015-03-16 DIAGNOSIS — E781 Pure hyperglyceridemia: Secondary | ICD-10-CM

## 2015-03-16 DIAGNOSIS — I4819 Other persistent atrial fibrillation: Secondary | ICD-10-CM | POA: Insufficient documentation

## 2015-03-16 HISTORY — DX: Pure hyperglyceridemia: E78.1

## 2015-03-16 NOTE — Assessment & Plan Note (Signed)
Avoid offending foods, start probiotics. Do not eat large meals in late evening and consider raising head of bed. Doing well. No changes

## 2015-03-16 NOTE — Assessment & Plan Note (Signed)
Pain managed with Gabapentin to some degree agrees to consultation with pain management, Dr Marcine Matar to explore his options. Pain did not improve with removal of chest wall mass but is tolerable

## 2015-03-16 NOTE — Assessment & Plan Note (Signed)
Asymptomatic found on exam, confirmed by EKG, has appt with cardiology soon. He will start Aspirin daily since he has no other significant risk factors and seek care for any concerning symptoms

## 2015-03-16 NOTE — Assessment & Plan Note (Signed)
Doing well following with Dr Karsten Ro

## 2015-03-16 NOTE — Assessment & Plan Note (Signed)
Follows with Dr Ronnald Ramp of dermatology Dr Carlean Purl of GI Dr Ellyn Hack of cardiology Dr Karsten Ro of urology Last colonoscopy in 2014 repeat in 5-10 years Patient denies any difficulties at home. No trouble with ADLs, depression or falls. No recent changes to vision or hearing. Is UTD with immunizations. Is UTD with screening. Discussed Advanced Directives, patient agrees to bring [name] copies of documents if can. Encouraged heart healthy diet, exercise as tolerated and adequate sleep. Reviewed annual labs

## 2015-03-16 NOTE — Assessment & Plan Note (Signed)
Well controlled. Encouraged heart healthy diet such as the DASH diet and exercise as tolerated.  

## 2015-03-16 NOTE — Assessment & Plan Note (Signed)
New, always had normal lipid panels in past. Very mild, encouraged to avoid simple carbs and trans fats and will monitor

## 2015-03-16 NOTE — Progress Notes (Signed)
[name] [name]  [id number] [birth year] 03/16/2015      Progress Note-Follow Up  Subjective  Chief Complaint  Chief Complaint  Patient presents with  . Annual Exam    HPI  Patient is a 74 y.o. male in today for routine medical care. Patient in today for routine medical care doing well. Continues to struggle with chest wall pain status post shingles and postherpetic neuralgia as well as the removal of right-sided chest wall mass. The mass fortunately turned out to be benign, I lipoma. Unfortunately he has been left with some residual pain and is presently establishing with pain management to see if they can offer him other alternatives besides medications to manage his pain. No new complaints at this time. Is here today with his wife confirms that otherwise he is doing well. He is eating well and resting well. No acute illness. Denies CP/palp/SOB/HA/congestion/fevers/GI or GU c/o. Taking meds as prescribed  Past Medical History  Diagnosis Date  . GERD (gastroesophageal reflux disease)     controlled w/ diet and behavioral changes  . BPH (benign prostatic hypertrophy)   . Elevated PSA   . White coat hypertension   . Shingles 07/09/2013  . Encounter for Medicare annual wellness exam 09/22/2013    Sees Dr Wilhemina Bonito of Derm Sees Dr Silvano Rusk of Gastroenterology Sees Dr Kathie Rhodes of [facility] Sees Dr Susa Day of Optometry  . Snoring disorder 07/30/2011  . Melanoma of back     Excised Dr Wilhemina Bonito  . Sleep apnea 07/30/2011    cpap- 13   . Bradycardia 12/30/2014  . Medicare annual wellness visit, subsequent 03/16/2015    Past Surgical History  Procedure Laterality Date  . Mass excision N/A 05/16/2014    Procedure: EXCISION POSTERIOR NECK MASS, RIGHT CHEST WALL MASS AND RIGHT AXILLARY MASS AXILLARY NODE DISSECTION;  Surgeon: Adin Hector, MD;  Location: [facility];  Service: General;  Laterality: N/A;    Family History  Problem Relation Age of Onset  . Cancer Mother    MM, leukemia  . Emphysema Father   . Cancer Sister     brain  . Alcohol abuse Other   . COPD Brother   . Heart disease Paternal Grandmother   . COPD Paternal Grandfather   . Diabetes Sister   . Obesity Sister   . Down syndrome Brother   . Colon cancer Neg Hx     History   Social History  . Marital Status: Married    Spouse Name: [name]  . Number of Children: 2  . Years of Education: N/A   Occupational History  . retired     Administrator   Social History Main Topics  . Smoking status: Former Smoker    Quit date: 02/11/1989  . Smokeless tobacco: Never Used  . Alcohol Use: 4.2 oz/week    7 Shots of liquor per week     Comment: 1 per day  . Drug Use: No  . Sexual Activity: Not on file     Comment: lives with wife, retired from [employer] driving, no dietary restrictions   Other Topics Concern  . Not on file   Social History Narrative    Current Outpatient Prescriptions on File Prior to Visit  Medication Sig Dispense Refill  . aspirin 81 MG chewable tablet Chew 81 mg by mouth every Monday, Wednesday, and Friday.     . gabapentin (NEURONTIN) 300 MG capsule TAKE THREE CAPSULES BY MOUTH THREE TIMES DAILY 270  capsule 2  . KRILL OIL PO Take 1 capsule by mouth daily.    . Multiple Vitamin (MULTIVITAMIN WITH MINERALS) TABS tablet Take 1 tablet by mouth daily.     No current facility-administered medications on file prior to visit.    No Known Allergies  Review of Systems  Review of Systems  Constitutional: Negative for fever, chills and malaise/fatigue.  HENT: Negative for congestion, hearing loss and nosebleeds.   Eyes: Negative for discharge.  Respiratory: Negative for cough, sputum production, shortness of breath and wheezing.   Cardiovascular: Positive for chest pain. Negative for palpitations and leg swelling.  Gastrointestinal: Negative for heartburn, nausea, vomiting, abdominal pain, diarrhea, constipation and blood in stool.  Genitourinary: Negative for  dysuria, urgency, frequency and hematuria.  Musculoskeletal: Negative for myalgias, back pain and falls.  Skin: Negative for rash.  Neurological: Negative for dizziness, tremors, sensory change, focal weakness, loss of consciousness, weakness and headaches.  Endo/Heme/Allergies: Negative for polydipsia. Does not bruise/bleed easily.  Psychiatric/Behavioral: Negative for depression and suicidal ideas. The patient is not nervous/anxious and does not have insomnia.     Objective  BP 102/72 mmHg  Pulse 63  Temp(Src) 97.6 F (36.4 C) (Oral)  Ht 5\' 10"  (1.778 m)  Wt 226 lb (102.513 kg)  BMI 32.43 kg/m2  SpO2 97%  Physical Exam  Physical Exam  Constitutional: He is oriented to person, place, and time and well-developed, well-nourished, and in no distress. No distress.  HENT:  Head: Normocephalic and atraumatic.  Eyes: Conjunctivae are normal.  Neck: Neck supple. No thyromegaly present.  Cardiovascular: Normal rate and normal heart sounds.   No murmur heard. Irregularly irregular  Pulmonary/Chest: Effort normal and breath sounds normal. No respiratory distress.  Abdominal: He exhibits no distension and no mass. There is no tenderness.  Musculoskeletal: He exhibits no edema.  Neurological: He is alert and oriented to person, place, and time.  Skin: Skin is warm.  Psychiatric: Memory, affect and judgment normal.    Lab Results  Component Value Date   TSH 2.65 03/10/2015   Lab Results  Component Value Date   WBC 9.3 03/10/2015   HGB 14.6 03/10/2015   HCT 43.7 03/10/2015   MCV 88.1 03/10/2015   PLT 246.0 03/10/2015   Lab Results  Component Value Date   CREATININE 1.01 03/10/2015   BUN 20 03/10/2015   NA 138 03/10/2015   K 4.4 03/10/2015   CL 106 03/10/2015   CO2 28 03/10/2015   Lab Results  Component Value Date   ALT 58* 03/10/2015   AST 27 03/10/2015   ALKPHOS 60 03/10/2015   BILITOT 0.4 03/10/2015   Lab Results  Component Value Date   CHOL 158 03/10/2015    Lab Results  Component Value Date   HDL 48.00 03/10/2015   Lab Results  Component Value Date   LDLCALC 79 03/10/2015   Lab Results  Component Value Date   TRIG 154.0* 03/10/2015   Lab Results  Component Value Date   CHOLHDL 3 03/10/2015     Assessment & Plan  GERD Avoid offending foods, start probiotics. Do not eat large meals in late evening and consider raising head of bed. Doing well. No changes   ELEVATED BP READING WITHOUT DX HYPERTENSION Well controlled. Encouraged heart healthy diet such as the DASH diet and exercise as tolerated.    Bradycardia RRR today   Post herpetic neuralgia Pain managed with Gabapentin to some degree agrees to consultation with pain management, Dr Marcine Matar to explore  his options. Pain did not improve with removal of chest wall mass but is tolerable   ELEVATED PROSTATE SPECIFIC ANTIGEN Doing well following with Dr Karsten Ro   Medicare annual wellness visit, subsequent Follows with Dr Ronnald Ramp of dermatology Dr Carlean Purl of GI Dr Ellyn Hack of cardiology Dr Karsten Ro of urology Last colonoscopy in 2014 repeat in 5-10 years Patient denies any difficulties at home. No trouble with ADLs, depression or falls. No recent changes to vision or hearing. Is UTD with immunizations. Is UTD with screening. Discussed Advanced Directives, patient agrees to bring [name] copies of documents if can. Encouraged heart healthy diet, exercise as tolerated and adequate sleep. Reviewed annual labs   New onset a-fib Asymptomatic found on exam, confirmed by EKG, has appt with cardiology soon. He will start Aspirin daily since he has no other significant risk factors and seek care for any concerning symptoms   Hypertriglyceridemia New, always had normal lipid panels in past. Very mild, encouraged to avoid simple carbs and trans fats and will monitor

## 2015-03-16 NOTE — Assessment & Plan Note (Signed)
RRR today 

## 2015-03-17 ENCOUNTER — Encounter: Payer: Self-pay | Admitting: Cardiology

## 2015-03-17 ENCOUNTER — Ambulatory Visit (INDEPENDENT_AMBULATORY_CARE_PROVIDER_SITE_OTHER): Payer: Medicare Other | Admitting: Cardiology

## 2015-03-17 VITALS — BP 132/70 | HR 54 | Ht 70.0 in | Wt 229.4 lb

## 2015-03-17 DIAGNOSIS — G4733 Obstructive sleep apnea (adult) (pediatric): Secondary | ICD-10-CM

## 2015-03-17 DIAGNOSIS — R001 Bradycardia, unspecified: Secondary | ICD-10-CM | POA: Diagnosis not present

## 2015-03-17 DIAGNOSIS — Z9989 Dependence on other enabling machines and devices: Secondary | ICD-10-CM

## 2015-03-17 DIAGNOSIS — I4891 Unspecified atrial fibrillation: Secondary | ICD-10-CM

## 2015-03-17 NOTE — Patient Instructions (Addendum)
Your physician has requested that you have an echocardiogram. Echocardiography is a painless test that uses sound waves to create images of your heart. It provides your doctor with information about the size and shape of your heart and how well your heart's chambers and valves are working. This procedure takes approximately one hour. There are no restrictions for this procedure.  Your physician has requested that you have en exercise stress myoview. For further information please visit [email or web address]. Please follow instruction sheet, as given.  Dr Ellyn Hack recommends that you schedule a follow-up appointment in 1-2 months (on a day when Cyril Mourning is in the office) [place].Marland Kitchen

## 2015-03-18 ENCOUNTER — Encounter: Payer: Self-pay | Admitting: Cardiology

## 2015-03-18 ENCOUNTER — Telehealth (HOSPITAL_COMMUNITY): Payer: Self-pay

## 2015-03-18 ENCOUNTER — Encounter: Payer: Self-pay | Admitting: *Deleted

## 2015-03-18 NOTE — Telephone Encounter (Signed)
Encounter complete. 

## 2015-03-18 NOTE — Assessment & Plan Note (Signed)
Relatively asymptomatic, he did state that he may have felt some irregular heartbeats after drinking coffee that morning. He has not had any caffeine since seeing his PCP and has not had a recurrence of the symptoms.  Current CHA2DS2VASC score would only be 1 based on his age 72-75.  He does not have a diagnosis of hypertension, cerebrovascular disease/stroke, vascular disease or cardiac disease, or CHF. For now would continue with low-dose aspirin for stroke prophylaxis. As he seemed to be relatively well rate controlled with his baseline bradycardia, I would not consider adding an AV nodal agent or antiarrhythmic, for fear of worsening his bradycardia. He is potentially at risk for tachybradycardia/sick sinus syndrome.  NAD 74 year old gentleman with hypertriglyceridemia, potentially borderline blood pressures nowwith new diagnosis of A. Fib I think we need to rule out any ischemic etiology as well as any structural heart abnormality. Certainly evaluate left atrial size would be important to understand how long standing this condition has been.  See Relevant orders below.Marland Kitchen

## 2015-03-18 NOTE — Assessment & Plan Note (Signed)
He does seem to be tolerating his CPAP without issues. Checking a 2-D echocardiogram will assess for any evidence of elevated pulmonary pressures.

## 2015-03-18 NOTE — Assessment & Plan Note (Signed)
Long-standing, asymptomatic. Certainly would not choose any rate or rhythm control agent at this juncture. No SSx to suggest symptomatic bradycardia -- such as chronotropic incompetence, near syncope or syncope, Gen. Fatigue etc.  We will evaluate his cords responsiveness on the treadmill portion of his Myoview.

## 2015-03-18 NOTE — Progress Notes (Signed)
PATIENT: [name] [name] MRN: [id number] DOB: 10-02-1941 PCP: [name], MD  Clinic Note: Chief Complaint  Patient presents with  . New Evaluation    Bradycardia and A-Fib. patient's wife reports an episode of a-fib. dr d/c'd caffiene and patient hasn't had any problems since. Patient reports no other complaints.  . Atrial Fibrillation  . Bradycardia   HPI: [name] [name] is a 74 y.o. male with a PMH below who presents today for cardiology consultation for chronic bradycardia and now just  Recently diagnosed new onset paroxysmal atrial fibrillation. He has long-standing bradycardia documented, but his primary had an EKG just on March 14 [name] PCP evaluation that showed rate controlled atrial fibrillation..  Interval History: [name] [name] is an otherwise healthy appearing gentleman in no acute distress. He has no real complaints whatsoever. He said he may have felt some strange complications the baby had EKG. He had had a cough in the morning and he felt palpitations after coffee. Since then he has not had any "caffeinated coffee "or other beverages with caffeine and has not noticed any further palpitation spells. He says looking back he may have had similar episodes in the past but never thought much about it. He denies any use since of fatigue or decreased energy. He has plenty of "get up and go " a does not complaint about easy fatigue. He doesn't have routine exercise but is always "active and on the go" he does go to the [facility] some days and does a vigorous workout routine. He also walk 30-45 minutes a day 7 days a week. He then also will always do yard work and working in his [employer] -- he has never noticed any symptoms of syncope or near syncope, rapid irregular heartbeats. No TIA or amaurosis fugax symptoms.  Another cardiac standpoint he denies any resting or exertional dyspnea or chest pressure/pain.  He denies any heartburn or symptoms of PND, orthopnea or edema.  He denies any  claudication symptoms.  He has never been diagnosed with hypertension, there is listed triglyceridemia, but is only taking Krill oil & last levels showed TG 154.  He is not had any  Previous diagnosis of stroke or TIA, peripheral vascular disease/claudication, no heart failure or other cardiac issues in the past. He really does not have a strong family history besides a grandmother with heart disease.   He has sister with obesity and diabetes who died at age 77, but otherwise really not much in the way of any family history.  Past Medical History  Diagnosis Date  . GERD (gastroesophageal reflux disease)     controlled w/ diet and behavioral changes  . BPH (benign prostatic hypertrophy)   . Elevated PSA   . Shingles 07/09/2013  . Encounter for Medicare annual wellness exam 09/22/2013    Sees Dr Wilhemina Bonito of Derm Sees Dr Silvano Rusk of Gastroenterology Sees Dr Kathie Rhodes of [facility] Sees Dr Susa Day of Optometry  . Snoring disorder 07/30/2011  . Sleep apnea 07/30/2011    cpap- 13   . Bradycardia 12/30/2014    HR routinely in mid 40s-50s  . New onset a-fib 03/10/2015  . Hypertriglyceridemia 03/16/2015  . White coat hypertension   . Melanoma of back     Excised Dr Wilhemina Bonito    Prior Cardiac Evaluation and Past Surgical History: Past Surgical History  Procedure Laterality Date  . Mass excision N/A 05/16/2014    Procedure: EXCISION POSTERIOR NECK MASS, RIGHT CHEST WALL MASS  AND RIGHT AXILLARY MASS AXILLARY NODE DISSECTION;  Surgeon: Adin Hector, MD;  Location: [facility];  Service: General;  Laterality: N/A;    No Known Allergies  Current Outpatient Prescriptions  Medication Sig Dispense Refill  . aspirin 81 MG chewable tablet Chew 81 mg by mouth every Monday, Wednesday, and Friday.     . Cholecalciferol (VITAMIN D) 2000 UNITS CAPS Take 1 capsule by mouth daily.    Marland Kitchen gabapentin (NEURONTIN) 300 MG capsule TAKE THREE CAPSULES BY MOUTH THREE TIMES DAILY 270 capsule 2  . KRILL  OIL PO Take 1 capsule by mouth daily.    . methocarbamol (ROBAXIN) 500 MG tablet Take 1 tablet by mouth as needed.    . Multiple Vitamin (MULTIVITAMIN WITH MINERALS) TABS tablet Take 1 tablet by mouth daily.     No current facility-administered medications for this visit.   History   Social History Narrative   He is a married father of 2, and grandfather of 75. He has been married to his wife [name] [name] for 51 years. He previously lived in [place] but has moved with his wife to [place], [place] to be close to his daughter [name] [name] and her family. 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 He quit smoking in 1990. He has 6-7 glasses of brandy or wine a week.    He exercises regularly with low impact aerobics 2 days a week and daily walks of 30-45 minutes. His exercise sessions or 60 minutes. He will do some type of exercise at least 7 days a week and is very active doing yard work and wood work.    family history includes Alcohol abuse in his other; COPD in his brother and paternal grandfather; Cancer in his mother and sister; Diabetes in his sister; Down syndrome in his brother; Emphysema in his father; Heart disease in his paternal grandmother; Obesity in his sister. There is no history of Colon cancer.  ROS: A comprehensive Review of Systems - was performed Review of Systems  Constitutional: Negative for weight loss and malaise/fatigue.  HENT: Negative for nosebleeds.   Eyes: Negative for blurred vision and double vision.  Cardiovascular: Positive for palpitations. Negative for claudication.  Gastrointestinal: Negative for blood in stool and melena.  Genitourinary: Negative for hematuria.  Musculoskeletal: Negative.  Negative for falls.  Neurological: Negative for dizziness, loss of consciousness, weakness and headaches.  Endo/Heme/Allergies: Does not bruise/bleed easily.  Psychiatric/Behavioral: Negative.   All  other systems reviewed and are negative.  PHYSICAL EXAM BP 132/70 mmHg  Pulse 54  Ht 5\' 10"  (1.778 m)  Wt 229 lb 6.4 oz (104.055 kg)  BMI 32.92 kg/m2 General appearance: alert, cooperative, appears stated age, no distress and well-nourished, well-groomed. Healthy-appearing. HEENT: [place]/AT, EOMI, MMM, anicteric sclera; has a beard. Neck: no adenopathy, no carotid bruit, no JVD, supple, symmetrical, trachea midline and thyroid not enlarged, symmetric, no tenderness/mass/nodules Lungs: clear to auscultation bilaterally, normal percussion bilaterally and nonlabored, good air movement Heart: bradycardic with regular rhythm, nondisplaced PMI, normal S1-S2 with no M/R/G. Abdomen: soft, non-tender; bowel sounds normal; no masses,  no organomegaly Extremities: extremities normal, atraumatic, no cyanosis or edema and no edema, redness or tenderness in the calves or thighs Pulses: 2+ and symmetric Skin: Skin color, texture, turgor normal. No rashes or lesions Neurologic: Alert and oriented X 3, normal strength and tone. Normal symmetric reflexes. Normal coordination and gait Cranial nerves: normal   Adult  ECG Report  Rate: 54 ;  Rhythm: sinus bradycardia, premature atrial contractions (PAC) and Left Axis Deviation (-31); otherwise no intervals and durations with borderline low voltage; also noted is poorly progression in the precordial leads (cannot exclude anterior infarct, age undetermined)  Narrative Interpretation: mildly abnormal EKG  As compared to the most recent EKG 03/09/2014 that showed atrial fibrillation with a rate of 83 beats a minute, axis of -13 but with poor  R-wave progression as well.  Recent Labs:    Chemistry      Component Value Date/Time   NA 138 03/10/2015 1213   K 4.4 03/10/2015 1213   CL 106 03/10/2015 1213   CO2 28 03/10/2015 1213   BUN 20 03/10/2015 1213   CREATININE 1.01 03/10/2015 1213   CREATININE 0.90 02/11/2014 1016      Component Value Date/Time   CALCIUM  9.6 03/10/2015 1213   ALKPHOS 60 03/10/2015 1213   AST 27 03/10/2015 1213   ALT 58* 03/10/2015 1213   BILITOT 0.4 03/10/2015 1213     Lab Results  Component Value Date   CHOL 158 03/10/2015   HDL 48.00 03/10/2015   LDLCALC 79 03/10/2015   TRIG 154.0* 03/10/2015   CHOLHDL 3 03/10/2015   Lab Results  Component Value Date   TSH 2.65 03/10/2015    ASSESSMENT / PLAN: Mr. Oakland is a mostly healthy gentleman who is very active with no major complaints, who has long-standing history of sinus bradycardia at and then surreptitiously was found to have atrial fibrillation on EKG just last week on the 14th that was rate controlled without any agents. This was during routine clinic followup with PCP. He has a reported history of elevated blood pressure readings but has not been given a diagnosis of hypertension, and velocity pressures are seen in certainly not shown hypertension. In the absence of any potential ischemic findings or structural abnormality of the heart, this would probably be considered to be [place] Atrial Fibrillation.    Problem List Items Addressed This Visit    Bradycardia (Chronic)    Long-standing, asymptomatic. Certainly would not choose any rate or rhythm control agent at this juncture. No SSx to suggest symptomatic bradycardia -- such as chronotropic incompetence, near syncope or syncope, Gen. Fatigue etc.  We will evaluate his cords responsiveness on the treadmill portion of his Myoview.      Relevant Orders   EKG 12-Lead   2D Echocardiogram without contrast   Myocardial Perfusion Imaging   New onset a-fib - Primary    Relatively asymptomatic, he did state that he may have felt some irregular heartbeats after drinking coffee that morning. He has not had any caffeine since seeing his PCP and has not had a recurrence of the symptoms.  Current CHA2DS2VASC score would only be 1 based on his age 48-75.  He does not have a diagnosis of hypertension, cerebrovascular  disease/stroke, vascular disease or cardiac disease, or CHF. For now would continue with low-dose aspirin for stroke prophylaxis. As he seemed to be relatively well rate controlled with his baseline bradycardia, I would not consider adding an AV nodal agent or antiarrhythmic, for fear of worsening his bradycardia. He is potentially at risk for tachybradycardia/sick sinus syndrome.  NAD 74 year old gentleman with hypertriglyceridemia, potentially borderline blood pressures nowwith new diagnosis of A. Fib I think we need to rule out any ischemic etiology as well as any structural heart abnormality. Certainly evaluate left atrial size would be important to understand how long standing this  condition has been.  See Relevant orders below..      Relevant Orders   EKG 12-Lead   2D Echocardiogram without contrast   Myocardial Perfusion Imaging   OSA on CPAP (Chronic)    He does seem to be tolerating his CPAP without issues. Checking a 2-D echocardiogram will assess for any evidence of elevated pulmonary pressures.         Meds ordered this encounter  Medications  . methocarbamol (ROBAXIN) 500 MG tablet    Sig: Take 1 tablet by mouth as needed.  . Cholecalciferol (VITAMIN D) 2000 UNITS CAPS    Sig: Take 1 capsule by mouth daily.    Followup: 1-2 months    HARDING, Leonie Green, M.D., M.S. Interventional Cardiologist   [place] [phone] Pager # [phone]

## 2015-03-19 DIAGNOSIS — R001 Bradycardia, unspecified: Secondary | ICD-10-CM

## 2015-03-19 DIAGNOSIS — I4891 Unspecified atrial fibrillation: Secondary | ICD-10-CM

## 2015-03-19 DIAGNOSIS — Z87891 Personal history of nicotine dependence: Secondary | ICD-10-CM | POA: Diagnosis not present

## 2015-03-19 DIAGNOSIS — R0609 Other forms of dyspnea: Secondary | ICD-10-CM | POA: Diagnosis not present

## 2015-03-19 DIAGNOSIS — G4733 Obstructive sleep apnea (adult) (pediatric): Secondary | ICD-10-CM | POA: Diagnosis not present

## 2015-03-19 HISTORY — PX: NM MYOVIEW LTD: HXRAD82

## 2015-03-19 MED ORDER — TECHNETIUM TC 99M SESTAMIBI GENERIC - CARDIOLITE
10.4000 | Freq: Once | INTRAVENOUS | Status: AC | PRN
  Administered 2015-03-19: 10 via INTRAVENOUS

## 2015-03-19 MED ORDER — TECHNETIUM TC 99M SESTAMIBI GENERIC - CARDIOLITE
31.4000 | Freq: Once | INTRAVENOUS | Status: AC | PRN
  Administered 2015-03-19: 31 via INTRAVENOUS

## 2015-03-19 NOTE — Procedures (Addendum)
[facility] [address] [place] [address] [phone]  Cardiology Nuclear Med Study  [name] [name] is a 74 y.o. male     MRN : [id number]     DOB: 1941/06/14  Procedure Date: 03/19/2015  Nuclear Med Background Indication for Stress Test:  Evaluation for Ischemia History:  AFIB (new onset);bradycardia;No further cardiac or respiratory history reported; No prior NUC MPI for comparison. Cardiac Risk Factors: History of Smoking, Overweight and hyperglycemia in the past;OSA w/CPAP  Symptoms:  DOE and Palpitations   Nuclear Pre-Procedure Caffeine/Decaff Intake:  9:30pm NPO After: 5:30am   IV Site: R Forearm  IV 0.9% NS with Angio Cath:  22g  Chest Size (in):  48" IV Started by: Rolene Course, RN  Height: 5\' 10"  (1.778 m)  Cup Size: n/a  BMI:  Body mass index is 32.86 kg/(m^2). Weight:  229 lb (103.874 kg)   Tech Comments:  N/A    Nuclear Med Study 1 or 2 day study: 1 day  Stress Test Type:  Stress  Order Authorizing Provider:  Glenetta Hew, MD   Resting Radionuclide: Technetium 52m Sestamibi  Resting Radionuclide Dose: 10.4 mCi   Stress Radionuclide:  Technetium 29m Sestamibi  Stress Radionuclide Dose: 31.4 mCi           Stress Protocol Rest HR: 68 Stress HR: 164  Rest BP: 140/97 Stress BP: 153/81  Exercise Time (min): 5:00 METS: 7.00   Predicted Max HR: 148 bpm % Max HR: 110.81 bpm Rate Pressure Product: 25092  Dose of Adenosine (mg):  n/a Dose of Lexiscan: n/a mg  Dose of Atropine (mg): n/a Dose of Dobutamine: n/a mcg/kg/min (at max HR)  Stress Test Technologist: Mellody Memos, CCT Nuclear Technologist: Imagene Riches, CNMT   Rest Procedure:  Myocardial perfusion imaging was performed at rest 45 minutes following the intravenous administration of Technetium 34m Sestamibi. Stress Procedure:  The patient performed treadmill exercise using a Bruce  Protocol for 5 minutes. The patient stopped due to shortness of  breath and fatigue. Patient denied any chest pain.  There were no significant ST-T wave changes.  Technetium 85m Sestamibi was injected IV at peak exercise and myocardial perfusion imaging was performed after a brief delay.  Transient Ischemic Dilatation (Normal <1.22):  1.07 QGS EDV:  NA QGS ESV:  NA  LV Ejection Fraction: Study not gated        Rest ECG: Atrial Fibrilliation  Stress ECG: NSVT  QPS Raw Data Images:  Normal; no motion artifact; normal heart/lung ratio. Stress Images:  There is decreased uptake in the inferior wall. Rest Images:  Normal homogeneous uptake in all areas of the myocardium. Subtraction (SDS):  These findings are consistent with ischemia.  Impression Exercise Capacity:  Fair exercise capacity. BP Response:  Normal blood pressure response. Clinical Symptoms:  No significant symptoms noted. ECG Impression:  No significant ST segment change suggestive of ischemia. Comparison with Prior Nuclear Study: No images to compare  Overall Impression:  Intermediate risk stress nuclear study Small in area, moderate in intensity inferior ischemic abnormality. Follow up with Dr. Ellyn Hack.  LV Wall Motion:  Not gated secondary to AFIB   Lorretta Harp, MD  03/19/2015 5:24 PM

## 2015-03-20 ENCOUNTER — Ambulatory Visit (HOSPITAL_COMMUNITY)
Admission: RE | Admit: 2015-03-20 | Discharge: 2015-03-20 | Disposition: A | Discharge status: Home / Self Care | Payer: Medicare Other | Source: Ambulatory Visit | Attending: Internal Medicine | Admitting: Internal Medicine

## 2015-03-20 ENCOUNTER — Ambulatory Visit (HOSPITAL_BASED_OUTPATIENT_CLINIC_OR_DEPARTMENT_OTHER)
Admission: RE | Admit: 2015-03-20 | Discharge: 2015-03-20 | Disposition: A | Discharge status: Home / Self Care | Payer: Medicare Other | Source: Ambulatory Visit | Attending: Internal Medicine | Admitting: Internal Medicine

## 2015-03-20 DIAGNOSIS — R001 Bradycardia, unspecified: Secondary | ICD-10-CM | POA: Diagnosis not present

## 2015-03-20 DIAGNOSIS — Z87891 Personal history of nicotine dependence: Secondary | ICD-10-CM | POA: Insufficient documentation

## 2015-03-20 DIAGNOSIS — I4891 Unspecified atrial fibrillation: Secondary | ICD-10-CM

## 2015-03-20 DIAGNOSIS — R0609 Other forms of dyspnea: Secondary | ICD-10-CM | POA: Insufficient documentation

## 2015-03-20 DIAGNOSIS — G4733 Obstructive sleep apnea (adult) (pediatric): Secondary | ICD-10-CM | POA: Insufficient documentation

## 2015-03-20 HISTORY — PX: TRANSTHORACIC ECHOCARDIOGRAM: SHX275

## 2015-03-20 NOTE — Progress Notes (Signed)
2D Echo Performed 03/20/2015    [name] [name], RCS

## 2015-03-21 NOTE — Progress Notes (Signed)
Quick Note:  Echo results: Good news: Essentially normal echocardiogram and normal pump function and normal valve function. No regional wall motion abnormalities = No signs to suggest heart attack.. As expected with age - Grade 1 diastolic dysfunction. EF: 60-65%. Mildly dilated ascending aorta - we will want to monitor this. Otherwise pretty normal for age.  [place]   ______

## 2015-03-26 ENCOUNTER — Telehealth: Payer: Self-pay | Admitting: *Deleted

## 2015-03-26 NOTE — Telephone Encounter (Signed)
-----   Message from Leonie Man, MD sent at 03/21/2015  2:36 PM EDT ----- Echo results: Good news: Essentially normal echocardiogram and normal pump function and normal valve function.  No regional wall motion abnormalities = No signs to suggest heart attack.. As expected with age - Grade 1 diastolic dysfunction. EF: 60-65%. Mildly dilated ascending aorta - we will want to monitor this. Otherwise pretty normal for age.  [place]

## 2015-03-26 NOTE — Telephone Encounter (Signed)
LEFT MESSAGE ON VOICE MAIL TO CALL BACK IN REGARDS TO ECHO RESULTS

## 2015-03-26 NOTE — Telephone Encounter (Signed)
Returning your call. °

## 2015-03-26 NOTE — Telephone Encounter (Signed)
RETURN CALLED -LEFT MESSAGE  WILL DISCUSS RESULTS AT APPOINTMENT 03/31/15. RETURN CALL IS NOT NECESSARY.

## 2015-03-28 DIAGNOSIS — Z0389 Encounter for observation for other suspected diseases and conditions ruled out: Secondary | ICD-10-CM

## 2015-03-28 HISTORY — DX: Encounter for observation for other suspected diseases and conditions ruled out: Z03.89

## 2015-03-31 ENCOUNTER — Encounter: Payer: Self-pay | Admitting: Cardiology

## 2015-03-31 ENCOUNTER — Ambulatory Visit (INDEPENDENT_AMBULATORY_CARE_PROVIDER_SITE_OTHER): Payer: Medicare Other | Admitting: Cardiology

## 2015-03-31 VITALS — BP 106/76 | HR 60 | Ht 70.0 in | Wt 228.0 lb

## 2015-03-31 DIAGNOSIS — Z01818 Encounter for other preprocedural examination: Secondary | ICD-10-CM

## 2015-03-31 DIAGNOSIS — R9439 Abnormal result of other cardiovascular function study: Secondary | ICD-10-CM

## 2015-03-31 DIAGNOSIS — R030 Elevated blood-pressure reading, without diagnosis of hypertension: Secondary | ICD-10-CM

## 2015-03-31 DIAGNOSIS — I480 Paroxysmal atrial fibrillation: Secondary | ICD-10-CM | POA: Diagnosis not present

## 2015-03-31 DIAGNOSIS — R001 Bradycardia, unspecified: Secondary | ICD-10-CM

## 2015-03-31 DIAGNOSIS — D689 Coagulation defect, unspecified: Secondary | ICD-10-CM

## 2015-03-31 DIAGNOSIS — E781 Pure hyperglyceridemia: Secondary | ICD-10-CM

## 2015-03-31 NOTE — Progress Notes (Signed)
PATIENT: [name] [name] MRN: [id number] DOB: [birth year] PCP: [name], MD  Clinic Note: Chief Complaint  Patient presents with  . Follow-up    Nuclear stress test results.  No chest pain, SOB, edema or dizziness.   HPI: [name] [name] is a 74 y.o. male with a PMH below who presents today for cardiology consultation for chronic bradycardia and now just  Recently diagnosed new onset paroxysmal atrial fibrillation. He has long-standing bradycardia documented, but his primary had an EKG just on March 14 [name] evaluation that showed rate controlled atrial fibrillation..  Interval History: [name] [name] is an otherwise healthy appearing gentleman in no acute distress. He has no real complaints whatsoever. He said he may have felt some strange complications the baby had EKG. He had had a cough in the morning and he felt palpitations after coffee. Since then he has not had any "caffeinated coffee "or other beverages with caffeine and has not noticed any further palpitation spells. He says looking back he may have had similar episodes in the past but never thought much about it. He denies any use since of fatigue or decreased energy. He has plenty of "get up and go " a does not complaint about easy fatigue. He doesn't have routine exercise but is always "active and on the go" he does go to the [facility] some days and does a vigorous workout routine. He also walk 30-45 minutes a day 7 days a week. He then also will always do yard work and working in his [employer] -- he has never noticed any symptoms of syncope or near syncope, rapid irregular heartbeats. No TIA or amaurosis fugax symptoms.  Another cardiac standpoint he denies any resting or exertional dyspnea or chest pressure/pain.  He denies any heartburn or symptoms of PND, orthopnea or edema.  He denies any claudication symptoms.  He has never been diagnosed with hypertension, there is listed triglyceridemia, but is only taking Krill oil & last levels  showed TG 154.  He is not had any  Previous diagnosis of stroke or TIA, peripheral vascular disease/claudication, no heart failure or other cardiac issues in the past. He really does not have a strong family history besides a grandmother with heart disease.   He has sister with obesity and diabetes who died at age 54, but otherwise really not much in the way of any family history.  Past Medical History  Diagnosis Date  . GERD (gastroesophageal reflux disease)     controlled w/ diet and behavioral changes  . BPH (benign prostatic hypertrophy)   . Elevated PSA   . Shingles 07/09/2013  . Encounter for Medicare annual wellness exam 09/22/2013    Sees Dr Wilhemina Bonito of Derm Sees Dr Silvano Rusk of Gastroenterology Sees Dr Kathie Rhodes of [facility] Sees Dr Susa Day of Optometry  . Snoring disorder 07/30/2011  . Sleep apnea 07/30/2011    cpap- 13   . Bradycardia 12/30/2014    HR routinely in mid 40s-50s  . New onset a-fib 03/10/2015  . Hypertriglyceridemia 03/16/2015  . White coat hypertension   . Melanoma of back     Excised Dr Wilhemina Bonito    Prior Cardiac Evaluation and Past Surgical History: Past Surgical History  Procedure Laterality Date  . Mass excision N/A 05/16/2014    Procedure: EXCISION POSTERIOR NECK MASS, RIGHT CHEST WALL MASS AND RIGHT AXILLARY MASS AXILLARY NODE DISSECTION;  Surgeon: Adin Hector, MD;  Location: [facility];  Service: General;  Laterality: N/A;  . Transthoracic echocardiogram  03/20/2015    Normal LV size and function. EF 60-65%. G1 DD. Trivial AI. Borderline aortic root dilation (41 mm), Mild LA dilation.  Marland Kitchen Nm myoview ltd  03/19/2015    INTERMEDIATE RISK. Small sized, moderate intensity inferior ischemic perfusion defect    No Known Allergies  Current Outpatient Prescriptions  Medication Sig Dispense Refill  . aspirin 81 MG chewable tablet Chew 81 mg by mouth every Monday, Wednesday, and Friday.     . Cholecalciferol (VITAMIN D) 2000 UNITS CAPS Take 1  capsule by mouth daily.    Marland Kitchen gabapentin (NEURONTIN) 300 MG capsule TAKE THREE CAPSULES BY MOUTH THREE TIMES DAILY 270 capsule 2  . KRILL OIL PO Take 1 capsule by mouth daily.    . Multiple Vitamin (MULTIVITAMIN WITH MINERALS) TABS tablet Take 1 tablet by mouth daily.     No current facility-administered medications for this visit.   History   Social History Narrative   He is a married father of 2, and grandfather of 26. He has been married to his wife [name] for 51 years. He previously lived in [place] but has moved with his wife to [place], [place] to be close to his daughter [name] and her family. [name] is our Nurse, adult.   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 He quit smoking in 1990. He has 6-7 glasses of brandy or wine a week.    He exercises regularly with low impact aerobics 2 days a week and daily walks of 30-45 minutes. His exercise sessions or 60 minutes. He will do some type of exercise at least 7 days a week and is very active doing yard work and wood work.    family history includes Alcohol abuse in his other; COPD in his brother and paternal grandfather; Cancer in his mother and sister; Diabetes in his sister; Down syndrome in his brother; Emphysema in his father; Heart disease in his paternal grandmother; Obesity in his sister. There is no history of Colon cancer.  ROS: A comprehensive Review of Systems - was performed Review of Systems  Constitutional: Negative for malaise/fatigue.  HENT: Negative.  Negative for nosebleeds.   Cardiovascular: Negative for chest pain (12 fleeting episodes of left upper chest discomfort after hitting results of his stress test), palpitations and claudication.  Gastrointestinal: Negative for blood in stool.  Genitourinary: Negative for flank pain.  Neurological: Negative for dizziness.  Endo/Heme/Allergies: Does not bruise/bleed easily.  All other systems reviewed and are negative.     PHYSICAL EXAM BP 106/76 mmHg  Pulse 60  Ht 5\' 10"  (1.778 m)  Wt 228 lb (103.42 kg)  BMI 32.71 kg/m2 General appearance: alert, cooperative, appears stated age, no distress and well-nourished, well-groomed. Healthy-appearing. HEENT: [place]/AT, EOMI, MMM, anicteric sclera; has a beard. Neck: no adenopathy, no carotid bruit, no JVD, supple, symmetrical, trachea midline and thyroid not enlarged, symmetric, no tenderness/mass/nodules Lungs: clear to auscultation bilaterally, normal percussion bilaterally and nonlabored, good air movement Heart: bradycardic with regular rhythm, nondisplaced PMI, normal S1-S2 with no M/R/G. Abdomen: soft, non-tender; bowel sounds normal; no masses,  no organomegaly Extremities: extremities normal, atraumatic, no cyanosis or edema and no edema, redness or tenderness in the calves or thighs Pulses: 2+ and symmetric Skin: Skin color, texture, turgor normal. No rashes or lesions Neurologic: Alert and oriented X 3, normal strength and tone. Normal symmetric reflexes. Normal coordination and gait Cranial nerves: normal   Adult  ECG Report  Rate: 54 ;  Rhythm: sinus bradycardia, premature atrial contractions (PAC) and Left Axis Deviation (-31); otherwise no intervals and durations with borderline low voltage; also noted is poorly progression in the precordial leads (cannot exclude anterior infarct, age undetermined)  Narrative Interpretation: mildly abnormal EKG  As compared to the most recent EKG 03/09/2014 that showed atrial fibrillation with a rate of 83 beats a minute, axis of -13 but with poor  R-wave progression as well.  Recent Labs:    Chemistry      Component Value Date/Time   NA 138 03/10/2015 1213   K 4.4 03/10/2015 1213   CL 106 03/10/2015 1213   CO2 28 03/10/2015 1213   BUN 20 03/10/2015 1213   CREATININE 1.01 03/10/2015 1213   CREATININE 0.90 02/11/2014 1016      Component Value Date/Time   CALCIUM 9.6 03/10/2015 1213   ALKPHOS 60 03/10/2015 1213    AST 27 03/10/2015 1213   ALT 58* 03/10/2015 1213   BILITOT 0.4 03/10/2015 1213     Lab Results  Component Value Date   CHOL 158 03/10/2015   HDL 48.00 03/10/2015   LDLCALC 79 03/10/2015   TRIG 154.0* 03/10/2015   CHOLHDL 3 03/10/2015   Lab Results  Component Value Date   TSH 2.65 03/10/2015    ASSESSMENT / PLAN: Mr. Paulson is a mostly healthy gentleman who is very active with no major complaints, who has long-standing history of sinus bradycardia at and then surreptitiously was found to have atrial fibrillation on EKG. he was evaluated with an echocardiogram and Myoview results noted above. Unfortunately the Myoview proved to be abnormal with intermediate risk.    Problem List Items Addressed This Visit    Abnormal exercise myocardial perfusion study - Primary    We discussed the findings on the stress test. He does appear to be inferior ischemia which in the absence of anginal symptoms is somewhat concerning. He did however only make it to 5 minutes on exertion and therefore may not be exerting himself enough to actually have symptoms.  We discussed potential options of simply medical management and monitoring for signs of symptoms versus proceeding with invasive evaluation to confirm or deny the presence of CAD. After discussing the risks, benefits, alternatives and indications of medical management versus cardiac catheterization. The patient has chosen to proceed with invasive evaluation with cardiac catheterization.  Plan: Diagnostic right catheterization next week.  The procedure with Risks/Benefits/Alternatives and Indications was reviewed with the patient and his wife.  All questions were answered.    Risks / Complications include, but not limited to: Death, MI, CVA/TIA, VF/VT (with defibrillation), Bradycardia (need for temporary pacer placement), contrast induced nephropathy, bleeding / bruising / hematoma / pseudoaneurysm, vascular or coronary injury (with possible  emergent CT or Vascular Surgery), adverse medication reactions, infection.  Additional risks involving the use of radiation with the possibility of radiation burns and cancer were explained in detail.  The patient and wife voice understanding and agree to proceed.         Relevant Orders   LEFT HEART CATHETERIZATION WITH CORONARY ANGIOGRAM   CBC   Basic metabolic panel   APTT   Protime-INR   Bradycardia (Chronic)    Asymptomatic. No signs of chronotropic competence. Avoid AV nodal agents if possible.      ELEVATED BP READING WITHOUT DX HYPERTENSION    Beautiful blood pressure today. Continue lifestyle modification.      Hypertriglyceridemia (Chronic)    Relatively  new finding. Had previously been well controlled. Dietary adjustments recommended. If presence of CAD is found, would consider statin therapy for plaque stabilization.      Paroxysmal atrial fibrillation - new onset (Chronic)    CHADS2VASC2 = 1 for now based on age.  Depending on what we see on a cardiac catheterization this may change his score. If there is significant CAD, we'll need to take in consideration the potential need for anticoagulation. My initial plan would be to consider will antiplatelet therapy initially and then convert to single phototherapy plus full anticoagulation.  If there is no CAD, would simply continue using aspirin. He is spontaneously rate controlled without AV nodal agent.  With resting bradycardia, will avoid AV nodal agent unless he has symptomatically tachycardic A. fib.        Other Visit Diagnoses    Pre-operative clearance        Relevant Orders    CBC    Basic metabolic panel    APTT    Protime-INR    Clotting disorder        Relevant Orders    APTT    Protime-INR       No orders of the defined types were placed in this encounter.    Followup:`2-3 weeks     Leonie Man, M.D., M.S. Interventional Cardiologist   [place] [phone] Pager #  [phone]

## 2015-03-31 NOTE — Patient Instructions (Signed)
Right radial  Site - LEFT HEART CATH-Your physician has requested that you have a cardiac catheterization. Cardiac catheterization is used to diagnose and/or treat various heart conditions. Doctors may recommend this procedure for a number of different reasons. The most common reason is to evaluate chest pain. Chest pain can be a symptom of coronary artery disease (CAD), and cardiac catheterization can show whether plaque is narrowing or blocking your heart's arteries. This procedure is also used to evaluate the valves, as well as measure the blood flow and oxygen levels in different parts of your heart. For further information please visit [email or web address]. Please follow instruction sheet, as given.   Your physician recommends that you schedule a follow-up appointment 2-3 WEEKS AFTER CATH  30 MIN APPOINTMENT.

## 2015-04-01 ENCOUNTER — Other Ambulatory Visit: Payer: Self-pay | Admitting: *Deleted

## 2015-04-01 ENCOUNTER — Encounter: Payer: Self-pay | Admitting: Cardiology

## 2015-04-01 ENCOUNTER — Ambulatory Visit: Payer: Medicare Other | Admitting: Family Medicine

## 2015-04-01 DIAGNOSIS — Z01818 Encounter for other preprocedural examination: Secondary | ICD-10-CM

## 2015-04-02 ENCOUNTER — Encounter: Payer: Self-pay | Admitting: Cardiology

## 2015-04-02 DIAGNOSIS — R9439 Abnormal result of other cardiovascular function study: Secondary | ICD-10-CM | POA: Insufficient documentation

## 2015-04-02 NOTE — Assessment & Plan Note (Signed)
We discussed the findings on the stress test. He does appear to be inferior ischemia which in the absence of anginal symptoms is somewhat concerning. He did however only make it to 5 minutes on exertion and therefore may not be exerting himself enough to actually have symptoms.  We discussed potential options of simply medical management and monitoring for signs of symptoms versus proceeding with invasive evaluation to confirm or deny the presence of CAD. After discussing the risks, benefits, alternatives and indications of medical management versus cardiac catheterization. The patient has chosen to proceed with invasive evaluation with cardiac catheterization.  Plan: Diagnostic right catheterization next week.  The procedure with Risks/Benefits/Alternatives and Indications was reviewed with the patient and his wife.  All questions were answered.    Risks / Complications include, but not limited to: Death, MI, CVA/TIA, VF/VT (with defibrillation), Bradycardia (need for temporary pacer placement), contrast induced nephropathy, bleeding / bruising / hematoma / pseudoaneurysm, vascular or coronary injury (with possible emergent CT or Vascular Surgery), adverse medication reactions, infection.  Additional risks involving the use of radiation with the possibility of radiation burns and cancer were explained in detail.  The patient and wife voice understanding and agree to proceed.

## 2015-04-02 NOTE — Assessment & Plan Note (Signed)
CHADS2VASC2 = 1 for now based on age.  Depending on what we see on a cardiac catheterization this may change his score. If there is significant CAD, we'll need to take in consideration the potential need for anticoagulation. My initial plan would be to consider will antiplatelet therapy initially and then convert to single phototherapy plus full anticoagulation.  If there is no CAD, would simply continue using aspirin. He is spontaneously rate controlled without AV nodal agent.  With resting bradycardia, will avoid AV nodal agent unless he has symptomatically tachycardic A. fib.

## 2015-04-02 NOTE — Progress Notes (Signed)
Urine drug screen for this encounter is consistent for no medication as reported

## 2015-04-02 NOTE — Assessment & Plan Note (Signed)
Relatively new finding. Had previously been well controlled. Dietary adjustments recommended. If presence of CAD is found, would consider statin therapy for plaque stabilization.

## 2015-04-02 NOTE — Assessment & Plan Note (Signed)
Asymptomatic. No signs of chronotropic competence. Avoid AV nodal agents if possible.

## 2015-04-02 NOTE — Assessment & Plan Note (Signed)
Beautiful blood pressure today. Continue lifestyle modification.

## 2015-04-04 DIAGNOSIS — Z01818 Encounter for other preprocedural examination: Secondary | ICD-10-CM | POA: Diagnosis not present

## 2015-04-04 DIAGNOSIS — D689 Coagulation defect, unspecified: Secondary | ICD-10-CM | POA: Diagnosis not present

## 2015-04-04 DIAGNOSIS — R9439 Abnormal result of other cardiovascular function study: Secondary | ICD-10-CM | POA: Diagnosis not present

## 2015-04-05 LAB — BASIC METABOLIC PANEL
BUN: 20 mg/dL (ref 6–23)
CO2: 28 meq/L (ref 19–32)
Calcium: 8.7 mg/dL (ref 8.4–10.5)
Chloride: 106 mEq/L (ref 96–112)
Creat: 0.92 mg/dL (ref 0.50–1.35)
Glucose, Bld: 83 mg/dL (ref 70–99)
POTASSIUM: 4.6 meq/L (ref 3.5–5.3)
SODIUM: 141 meq/L (ref 135–145)

## 2015-04-05 LAB — CBC
HCT: 43.1 % (ref 39.0–52.0)
HEMOGLOBIN: 13.8 g/dL (ref 13.0–17.0)
MCH: 28.8 pg (ref 26.0–34.0)
MCHC: 32 g/dL (ref 30.0–36.0)
MCV: 89.8 fL (ref 78.0–100.0)
MPV: 9.7 fL (ref 8.6–12.4)
Platelets: 244 10*3/uL (ref 150–400)
RBC: 4.8 MIL/uL (ref 4.22–5.81)
RDW: 14.8 % (ref 11.5–15.5)
WBC: 8.5 10*3/uL (ref 4.0–10.5)

## 2015-04-05 LAB — PROTIME-INR
INR: 1.22 (ref <1.50)
PROTHROMBIN TIME: 15.4 s — AB (ref 11.6–15.2)

## 2015-04-05 LAB — APTT: aPTT: 35 seconds (ref 24–37)

## 2015-04-10 ENCOUNTER — Ambulatory Visit (HOSPITAL_COMMUNITY)
Admission: RE | Admit: 2015-04-10 | Discharge: 2015-04-10 | Disposition: A | Discharge status: Home / Self Care | Payer: Medicare Other | Source: Ambulatory Visit | Attending: Cardiology | Admitting: Cardiology

## 2015-04-10 ENCOUNTER — Encounter (HOSPITAL_COMMUNITY): Payer: Self-pay | Admitting: Cardiology

## 2015-04-10 ENCOUNTER — Encounter (HOSPITAL_COMMUNITY)
Admission: RE | Disposition: A | Discharge status: Home / Self Care | Payer: Self-pay | Source: Ambulatory Visit | Attending: Cardiology

## 2015-04-10 DIAGNOSIS — K219 Gastro-esophageal reflux disease without esophagitis: Secondary | ICD-10-CM | POA: Insufficient documentation

## 2015-04-10 DIAGNOSIS — R001 Bradycardia, unspecified: Secondary | ICD-10-CM | POA: Diagnosis present

## 2015-04-10 DIAGNOSIS — N400 Enlarged prostate without lower urinary tract symptoms: Secondary | ICD-10-CM | POA: Diagnosis not present

## 2015-04-10 DIAGNOSIS — I480 Paroxysmal atrial fibrillation: Secondary | ICD-10-CM | POA: Diagnosis not present

## 2015-04-10 DIAGNOSIS — I4819 Other persistent atrial fibrillation: Secondary | ICD-10-CM | POA: Diagnosis present

## 2015-04-10 DIAGNOSIS — I2510 Atherosclerotic heart disease of native coronary artery without angina pectoris: Secondary | ICD-10-CM | POA: Insufficient documentation

## 2015-04-10 DIAGNOSIS — Z01818 Encounter for other preprocedural examination: Secondary | ICD-10-CM

## 2015-04-10 DIAGNOSIS — E781 Pure hyperglyceridemia: Secondary | ICD-10-CM | POA: Diagnosis not present

## 2015-04-10 DIAGNOSIS — Z87891 Personal history of nicotine dependence: Secondary | ICD-10-CM | POA: Diagnosis not present

## 2015-04-10 DIAGNOSIS — R9439 Abnormal result of other cardiovascular function study: Secondary | ICD-10-CM | POA: Diagnosis present

## 2015-04-10 DIAGNOSIS — I10 Essential (primary) hypertension: Secondary | ICD-10-CM | POA: Insufficient documentation

## 2015-04-10 DIAGNOSIS — Z7982 Long term (current) use of aspirin: Secondary | ICD-10-CM | POA: Insufficient documentation

## 2015-04-10 DIAGNOSIS — G4730 Sleep apnea, unspecified: Secondary | ICD-10-CM | POA: Diagnosis not present

## 2015-04-10 DIAGNOSIS — Z9981 Dependence on supplemental oxygen: Secondary | ICD-10-CM | POA: Insufficient documentation

## 2015-04-10 DIAGNOSIS — I4821 Permanent atrial fibrillation: Secondary | ICD-10-CM | POA: Diagnosis present

## 2015-04-10 HISTORY — PX: LEFT HEART CATHETERIZATION WITH CORONARY ANGIOGRAM: SHX5451

## 2015-04-10 SURGERY — LEFT HEART CATHETERIZATION WITH CORONARY ANGIOGRAM

## 2015-04-10 MED ORDER — SODIUM CHLORIDE 0.9 % IV SOLN: 1.0000 mL/kg/h | INTRAVENOUS | Status: DC

## 2015-04-10 MED ORDER — ACETAMINOPHEN 325 MG PO TABS: 650.0000 mg | ORAL_TABLET | ORAL | Status: DC | PRN

## 2015-04-10 MED ORDER — NITROGLYCERIN 1 MG/10 ML FOR IR/CATH LAB
INTRA_ARTERIAL | Status: AC
  Filled 2015-04-10: qty 10

## 2015-04-10 MED ORDER — HEPARIN (PORCINE) IN NACL 2-0.9 UNIT/ML-% IJ SOLN
INTRAMUSCULAR | Status: AC
  Filled 2015-04-10: qty 1000

## 2015-04-10 MED ORDER — FENTANYL CITRATE 0.05 MG/ML IJ SOLN
INTRAMUSCULAR | Status: AC
Start: 2015-04-10 — End: 2015-04-10
  Filled 2015-04-10: qty 2

## 2015-04-10 MED ORDER — LIDOCAINE HCL (PF) 1 % IJ SOLN
INTRAMUSCULAR | Status: AC
  Filled 2015-04-10: qty 30

## 2015-04-10 MED ORDER — SODIUM CHLORIDE 0.9 % IJ SOLN: 3.0000 mL | INTRAMUSCULAR | Status: DC | PRN

## 2015-04-10 MED ORDER — HEPARIN SODIUM (PORCINE) 1000 UNIT/ML IJ SOLN
INTRAMUSCULAR | Status: AC
  Filled 2015-04-10: qty 1

## 2015-04-10 MED ORDER — VERAPAMIL HCL 2.5 MG/ML IV SOLN
INTRAVENOUS | Status: AC
  Filled 2015-04-10: qty 2

## 2015-04-10 MED ORDER — SODIUM CHLORIDE 0.9 % IJ SOLN: 3.0000 mL | Freq: Two times a day (BID) | INTRAMUSCULAR | Status: DC

## 2015-04-10 MED ORDER — ONDANSETRON HCL 4 MG/2ML IJ SOLN: 4.0000 mg | Freq: Four times a day (QID) | INTRAMUSCULAR | Status: DC | PRN

## 2015-04-10 MED ORDER — SODIUM CHLORIDE 0.9 % IV SOLN
INTRAVENOUS | Status: DC
  Administered 2015-04-10: 1000 mL via INTRAVENOUS

## 2015-04-10 MED ORDER — MIDAZOLAM HCL 2 MG/2ML IJ SOLN
INTRAMUSCULAR | Status: AC
  Filled 2015-04-10: qty 2

## 2015-04-10 NOTE — H&P (View-Only) (Signed)
PATIENT: [name] [name] MRN: [id number] DOB: 04-24-1941 PCP: [name], MD  Clinic Note: Chief Complaint  Patient presents with  . Follow-up    Nuclear stress test results.  No chest pain, SOB, edema or dizziness.   HPI: [name] is a 74 y.o. male with a PMH below who presents today for cardiology consultation for chronic bradycardia and now just  Recently diagnosed new onset paroxysmal atrial fibrillation. He has long-standing bradycardia documented, but his primary had an EKG just on March 14 [name] evaluation that showed rate controlled atrial fibrillation..  Interval History: [name] [name] is an otherwise healthy appearing gentleman in no acute distress. He has no real complaints whatsoever. He said he may have felt some strange complications the baby had EKG. He had had a cough in the morning and he felt palpitations after coffee. Since then he has not had any "caffeinated coffee "or other beverages with caffeine and has not noticed any further palpitation spells. He says looking back he may have had similar episodes in the past but never thought much about it. He denies any use since of fatigue or decreased energy. He has plenty of "get up and go " a does not complaint about easy fatigue. He doesn't have routine exercise but is always "active and on the go" he does go to the [facility] some days and does a vigorous workout routine. He also walk 30-45 minutes a day 7 days a week. He then also will always do yard work and working in his [employer] -- he has never noticed any symptoms of syncope or near syncope, rapid irregular heartbeats. No TIA or amaurosis fugax symptoms.  Another cardiac standpoint he denies any resting or exertional dyspnea or chest pressure/pain.  He denies any heartburn or symptoms of PND, orthopnea or edema.  He denies any claudication symptoms.  He has never been diagnosed with hypertension, there is listed triglyceridemia, but is only taking Krill oil & last levels  showed TG 154.  He is not had any  Previous diagnosis of stroke or TIA, peripheral vascular disease/claudication, no heart failure or other cardiac issues in the past. He really does not have a strong family history besides a grandmother with heart disease.   He has sister with obesity and diabetes who died at age 74, but otherwise really not much in the way of any family history.  Past Medical History  Diagnosis Date  . GERD (gastroesophageal reflux disease)     controlled w/ diet and behavioral changes  . BPH (benign prostatic hypertrophy)   . Elevated PSA   . Shingles 07/09/2013  . Encounter for Medicare annual wellness exam 09/22/2013    Sees Dr Wilhemina Bonito of Derm Sees Dr Silvano Rusk of Gastroenterology Sees Dr Kathie Rhodes of [facility] Sees Dr Susa Day of Optometry  . Snoring disorder 07/30/2011  . Sleep apnea 07/30/2011    cpap- 13   . Bradycardia 12/30/2014    HR routinely in mid 40s-50s  . New onset a-fib 03/10/2015  . Hypertriglyceridemia 03/16/2015  . White coat hypertension   . Melanoma of back     Excised Dr Wilhemina Bonito    Prior Cardiac Evaluation and Past Surgical History: Past Surgical History  Procedure Laterality Date  . Mass excision N/A 05/16/2014    Procedure: EXCISION POSTERIOR NECK MASS, RIGHT CHEST WALL MASS AND RIGHT AXILLARY MASS AXILLARY NODE DISSECTION;  Surgeon: Adin Hector, MD;  Location: [facility];  Service: General;  Laterality: N/A;  . Transthoracic echocardiogram  03/20/2015    Normal LV size and function. EF 60-65%. G1 DD. Trivial AI. Borderline aortic root dilation (41 mm), Mild LA dilation.  Marland Kitchen Nm myoview ltd  03/19/2015    INTERMEDIATE RISK. Small sized, moderate intensity inferior ischemic perfusion defect    No Known Allergies  Current Outpatient Prescriptions  Medication Sig Dispense Refill  . aspirin 81 MG chewable tablet Chew 81 mg by mouth every Monday, Wednesday, and Friday.     . Cholecalciferol (VITAMIN D) 2000 UNITS CAPS Take 1  capsule by mouth daily.    Marland Kitchen gabapentin (NEURONTIN) 300 MG capsule TAKE THREE CAPSULES BY MOUTH THREE TIMES DAILY 270 capsule 2  . KRILL OIL PO Take 1 capsule by mouth daily.    . Multiple Vitamin (MULTIVITAMIN WITH MINERALS) TABS tablet Take 1 tablet by mouth daily.     No current facility-administered medications for this visit.   History   Social History Narrative   He is a married father of 2, and grandfather of 46. He has been married to his wife [name] [name] for 51 years. He previously lived in [place] but has moved with his wife to [place], [place] to be close to his daughter [name] [name] and her family. 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 He quit smoking in 1990. He has 6-7 glasses of brandy or wine a week.    He exercises regularly with low impact aerobics 2 days a week and daily walks of 30-45 minutes. His exercise sessions or 60 minutes. He will do some type of exercise at least 7 days a week and is very active doing yard work and wood work.    family history includes Alcohol abuse in his other; COPD in his brother and paternal grandfather; Cancer in his mother and sister; Diabetes in his sister; Down syndrome in his brother; Emphysema in his father; Heart disease in his paternal grandmother; Obesity in his sister. There is no history of Colon cancer.  ROS: A comprehensive Review of Systems - was performed Review of Systems  Constitutional: Negative for malaise/fatigue.  HENT: Negative.  Negative for nosebleeds.   Cardiovascular: Negative for chest pain (12 fleeting episodes of left upper chest discomfort after hitting results of his stress test), palpitations and claudication.  Gastrointestinal: Negative for blood in stool.  Genitourinary: Negative for flank pain.  Neurological: Negative for dizziness.  Endo/Heme/Allergies: Does not bruise/bleed easily.  All other systems reviewed and are negative.     PHYSICAL EXAM BP 106/76 mmHg  Pulse 60  Ht 5\' 10"  (1.778 m)  Wt 228 lb (103.42 kg)  BMI 32.71 kg/m2 General appearance: alert, cooperative, appears stated age, no distress and well-nourished, well-groomed. Healthy-appearing. HEENT: [place]/AT, EOMI, MMM, anicteric sclera; has a beard. Neck: no adenopathy, no carotid bruit, no JVD, supple, symmetrical, trachea midline and thyroid not enlarged, symmetric, no tenderness/mass/nodules Lungs: clear to auscultation bilaterally, normal percussion bilaterally and nonlabored, good air movement Heart: bradycardic with regular rhythm, nondisplaced PMI, normal S1-S2 with no M/R/G. Abdomen: soft, non-tender; bowel sounds normal; no masses,  no organomegaly Extremities: extremities normal, atraumatic, no cyanosis or edema and no edema, redness or tenderness in the calves or thighs Pulses: 2+ and symmetric Skin: Skin color, texture, turgor normal. No rashes or lesions Neurologic: Alert and oriented X 3, normal strength and tone. Normal symmetric reflexes. Normal coordination and gait Cranial nerves: normal   Adult  ECG Report  Rate: 54 ;  Rhythm: sinus bradycardia, premature atrial contractions (PAC) and Left Axis Deviation (-31); otherwise no intervals and durations with borderline low voltage; also noted is poorly progression in the precordial leads (cannot exclude anterior infarct, age undetermined)  Narrative Interpretation: mildly abnormal EKG  As compared to the most recent EKG 03/09/2014 that showed atrial fibrillation with a rate of 83 beats a minute, axis of -13 but with poor  R-wave progression as well.  Recent Labs:    Chemistry      Component Value Date/Time   NA 138 03/10/2015 1213   K 4.4 03/10/2015 1213   CL 106 03/10/2015 1213   CO2 28 03/10/2015 1213   BUN 20 03/10/2015 1213   CREATININE 1.01 03/10/2015 1213   CREATININE 0.90 02/11/2014 1016      Component Value Date/Time   CALCIUM 9.6 03/10/2015 1213   ALKPHOS 60 03/10/2015 1213    AST 27 03/10/2015 1213   ALT 58* 03/10/2015 1213   BILITOT 0.4 03/10/2015 1213     Lab Results  Component Value Date   CHOL 158 03/10/2015   HDL 48.00 03/10/2015   LDLCALC 79 03/10/2015   TRIG 154.0* 03/10/2015   CHOLHDL 3 03/10/2015   Lab Results  Component Value Date   TSH 2.65 03/10/2015    ASSESSMENT / PLAN: Mr. Kuwahara is a mostly healthy gentleman who is very active with no major complaints, who has long-standing history of sinus bradycardia at and then surreptitiously was found to have atrial fibrillation on EKG. he was evaluated with an echocardiogram and Myoview results noted above. Unfortunately the Myoview proved to be abnormal with intermediate risk.    Problem List Items Addressed This Visit    Abnormal exercise myocardial perfusion study - Primary    We discussed the findings on the stress test. He does appear to be inferior ischemia which in the absence of anginal symptoms is somewhat concerning. He did however only make it to 5 minutes on exertion and therefore may not be exerting himself enough to actually have symptoms.  We discussed potential options of simply medical management and monitoring for signs of symptoms versus proceeding with invasive evaluation to confirm or deny the presence of CAD. After discussing the risks, benefits, alternatives and indications of medical management versus cardiac catheterization. The patient has chosen to proceed with invasive evaluation with cardiac catheterization.  Plan: Diagnostic right catheterization next week.  The procedure with Risks/Benefits/Alternatives and Indications was reviewed with the patient and his wife.  All questions were answered.    Risks / Complications include, but not limited to: Death, MI, CVA/TIA, VF/VT (with defibrillation), Bradycardia (need for temporary pacer placement), contrast induced nephropathy, bleeding / bruising / hematoma / pseudoaneurysm, vascular or coronary injury (with possible  emergent CT or Vascular Surgery), adverse medication reactions, infection.  Additional risks involving the use of radiation with the possibility of radiation burns and cancer were explained in detail.  The patient and wife voice understanding and agree to proceed.         Relevant Orders   LEFT HEART CATHETERIZATION WITH CORONARY ANGIOGRAM   CBC   Basic metabolic panel   APTT   Protime-INR   Bradycardia (Chronic)    Asymptomatic. No signs of chronotropic competence. Avoid AV nodal agents if possible.      ELEVATED BP READING WITHOUT DX HYPERTENSION    Beautiful blood pressure today. Continue lifestyle modification.      Hypertriglyceridemia (Chronic)    Relatively  new finding. Had previously been well controlled. Dietary adjustments recommended. If presence of CAD is found, would consider statin therapy for plaque stabilization.      Paroxysmal atrial fibrillation - new onset (Chronic)    CHADS2VASC2 = 1 for now based on age.  Depending on what we see on a cardiac catheterization this may change his score. If there is significant CAD, we'll need to take in consideration the potential need for anticoagulation. My initial plan would be to consider will antiplatelet therapy initially and then convert to single phototherapy plus full anticoagulation.  If there is no CAD, would simply continue using aspirin. He is spontaneously rate controlled without AV nodal agent.  With resting bradycardia, will avoid AV nodal agent unless he has symptomatically tachycardic A. fib.        Other Visit Diagnoses    Pre-operative clearance        Relevant Orders    CBC    Basic metabolic panel    APTT    Protime-INR    Clotting disorder        Relevant Orders    APTT    Protime-INR       No orders of the defined types were placed in this encounter.    Followup:`2-3 weeks     Leonie Man, M.D., M.S. Interventional Cardiologist   [place] [phone] Pager #  [phone]

## 2015-04-10 NOTE — CV Procedure (Signed)
CARDIAC CATHETERIZATION REPORT  NAME:  [name] [name]   MRN: [id number] DOB:  November 13, 1941   ADMIT DATE: 04/10/2015 Procedure Date: 04/10/2015  INTERVENTIONAL CARDIOLOGIST: Leonie Man, M.D., MS PRIMARY CARE PROVIDER: [name], MD PRIMARY CARDIOLOGIST:  Leonie Man, MD, MS  PATIENT:  [name] [name] is a 74 y.o. male with no real other cardiac history aside known bradycardia he was recently diagnosed with atrial fibrillation that was asymptomatic. As part of his routine evaluation given his age, he had a Myoview stress test that suggested inferior ischemia with an Intermediate Risk. Based on the clear suggestion of inferior ischemia, after discussion with the patient and family we decided the best option would be to proceed with invasive evaluation to exclude significant RCA disease. He now presents for diagnostic catheterization.  PRE-OPERATIVE DIAGNOSIS:    Abnormal Nuclear Stress Test  New diagnosis Atrial Fibrillation  PROCEDURES PERFORMED:    Left Heart Catheterization with Native Coronary  Angiography  via Right Common Femoral Artery   Left Ventriculography  PROCEDURE: The patient was brought to the 2nd [facility] Lab in the fasting state and prepped and draped in the usual sterile fashion for right radial and common femoral artery access. A modified Allen's test was performed on the right wrist demonstrating excellent collateral flow for radial access.   Sterile technique was used including antiseptics, cap, gloves, gown, hand hygiene, mask and sheet. Skin prep: Chlorhexidine.   Consent: Risks of procedure as well as the alternatives and risks of each were explained to the (patient/caregiver). Consent for procedure obtained.   Time Out: Verified patient identification, verified procedure, site/side was marked, verified correct patient position, special equipment/implants available, medications/allergies/relevent history reviewed, required  imaging and test results available. Performed.  Access:   Right radial Artery: Ultrasound guided modified Seldinger Technique (Angiocath Micropuncture) - despite excellent ultrasound-guided arterial access, with brisk bleeding back through the Angiocath, the wire did not advance beyond 1 inch from the Angiocath. 3 attempts were made and after unsuccessful attempts to wire the radial artery, renal access was aborted with plan to switch over to femoral access.  Right Common Femoral Artery: 5 Fr Sheath -  fluoroscopically guided modified Seldinger Technique   Left Heart Catheterization: 5 Fr Catheters advanced or exchanged over a J-wire using fluoroscopic guidance into the ascending aorta.; JL4 catheter advanced first.  Left Coronary Artery Cineangiography: JL4 Catheter  Right Coronary Artery Cineangiography: JR4 Catheter   LV Hemodynamics (LV Gram): Angled pigtail  Sheath removed in the Cardiac Cath Lab with expression Angio-Seal closure for hemostasis.   FINDINGS:  Hemodynamics:   Central Aortic Pressure / Mean: 119/68/88 mmHg  Left Ventricular Pressure / LVEDP: 115/1/18 mmHg  Left Ventriculography:  EF: Roughly 45% with  global hypokinesis from ectopy. %  Coronary Anatomy:  Dominance: Right  Left Main: Large-caliber vessel that trifurcates into the LAD, Ramus Intermedius and Circumflex. Angiographically normal.  LAD: Normal caliber vessel that tapers down to the apex perfusing the distal inferolateral apex.  There is a roughly 20-30% focal stenosis at the takeoff of the septal perforator just beyond the first diagonal branch. Otherwise Minimal irregularities.  D1: Moderate caliber vessel from the early mid LAD. Angiographically normal.  Left Circumflex: Moderate caliber vessel which courses a lateral OM 1 with a couple small branches. There is a small AV groove circumflex. Angiographically normal.  Ramus intermedius: Moderate large-caliber vessel that reaches almost to the  apex. Angiographically normal.   RCA: Large-caliber, dominant vessel that  terminates as a posterior descending artery after giving rise to a small  Right Posterior AV Groove Branch (RPAV). Minimal luminal irregularities distally. No significant disease. The PDA almost reaches the apex.  RPL Sysytem:The RPAV begins as a spell of the small caliber vessel with several small posterolateral branches. Minimal luminal irregularities.  MEDICATIONS:  Anesthesia:  Local Lidocaine 2 ml -radial; 16 mL femoral  Sedation:  2 mg IV Versed, 25 mcg IV fentanyl ;   Omnipaque Contrast: 80 ml  PATIENT DISPOSITION:    The patient was transferred to the PACU holding area in a hemodynamicaly stable, chest pain free condition.  The patient tolerated the procedure well, and there were no complications.  EBL:   < 10 ml  The patient was stable before, during, and after the procedure.  POST-OPERATIVE DIAGNOSIS:    Angiographically essentially normal coronary arteries. Likely false positive nuclear stress test. This may potentially be related to significant ectopy.  Mild moderately reduced EF by LV gram however this does not correlate with echocardiography findings the patient has significant ectopy during the LV gram which makes wall motion difficult to interpret.  Normal LVEDP.  PLAN OF CARE:  Standard post femoral cath care after Angio-Seal closure.  Anticipate discharge after bedrest.  He will need to follow-up with me as scheduled.    Leonie Man, M.D., M.S. Interventional Cardiologist   Pager # [phone]

## 2015-04-10 NOTE — Discharge Instructions (Signed)

## 2015-04-10 NOTE — Interval H&P Note (Signed)
History and Physical Interval Note:  04/10/2015 9:03 AM  [name] [name]  has presented today for surgery, with the diagnosis of afib - abnormal nuclear stress test  The various methods of treatment have been discussed with the patient and family. After consideration of risks, benefits and other options for treatment, the patient has consented to  Procedure(s): LEFT HEART CATHETERIZATION WITH CORONARY ANGIOGRAM (N/A) as a surgical intervention .  The patient's history has been reviewed, patient examined, no change in status, stable for surgery.  I have reviewed the patient's chart and labs.  Questions were answered to the patient's satisfaction.    Cath Lab Visit (complete for each Cath Lab visit)  Clinical Evaluation Leading to the Procedure:   ACS: No.  Non-ACS:    Anginal Classification: CCS I  Anti-ischemic medical therapy: Minimal Therapy (1 class of medications)  Non-Invasive Test Results: Intermediate-risk stress test findings: cardiac mortality 1-3%/year  Prior CABG: No previous CABG   HARDING, DAVID W

## 2015-04-18 DIAGNOSIS — R972 Elevated prostate specific antigen [PSA]: Secondary | ICD-10-CM | POA: Diagnosis not present

## 2015-04-21 ENCOUNTER — Ambulatory Visit (INDEPENDENT_AMBULATORY_CARE_PROVIDER_SITE_OTHER): Payer: Medicare Other | Admitting: Cardiology

## 2015-04-21 VITALS — BP 112/82 | HR 79 | Ht 70.0 in | Wt 231.1 lb

## 2015-04-21 DIAGNOSIS — R9439 Abnormal result of other cardiovascular function study: Secondary | ICD-10-CM | POA: Diagnosis not present

## 2015-04-21 DIAGNOSIS — G4733 Obstructive sleep apnea (adult) (pediatric): Secondary | ICD-10-CM

## 2015-04-21 DIAGNOSIS — I480 Paroxysmal atrial fibrillation: Secondary | ICD-10-CM | POA: Diagnosis not present

## 2015-04-21 DIAGNOSIS — R001 Bradycardia, unspecified: Secondary | ICD-10-CM | POA: Diagnosis not present

## 2015-04-21 DIAGNOSIS — Z9989 Dependence on other enabling machines and devices: Secondary | ICD-10-CM

## 2015-04-21 NOTE — Patient Instructions (Signed)
KEEP HYDRATED  NO CHANGE WITH MEDICATIONS  Your physician wants you to follow-up in 12 MONTH DR HARDING 30 MIN APPT.  You will receive a reminder letter in the mail two months in advance. If you don't receive a letter, please call our office to schedule the follow-up appointment.

## 2015-04-22 ENCOUNTER — Telehealth: Payer: Self-pay | Admitting: *Deleted

## 2015-04-22 DIAGNOSIS — B0229 Other postherpetic nervous system involvement: Secondary | ICD-10-CM

## 2015-04-22 DIAGNOSIS — R351 Nocturia: Secondary | ICD-10-CM | POA: Diagnosis not present

## 2015-04-22 DIAGNOSIS — N401 Enlarged prostate with lower urinary tract symptoms: Secondary | ICD-10-CM | POA: Diagnosis not present

## 2015-04-22 DIAGNOSIS — R972 Elevated prostate specific antigen [PSA]: Secondary | ICD-10-CM | POA: Diagnosis not present

## 2015-04-22 DIAGNOSIS — N138 Other obstructive and reflux uropathy: Secondary | ICD-10-CM | POA: Diagnosis not present

## 2015-04-22 NOTE — Telephone Encounter (Signed)
Mr [name] [name] called asking to be scheduled for injection.  Looks like your last note says  thoracic injection a possibility

## 2015-04-22 NOTE — Telephone Encounter (Signed)
May refer to Dr. Laurence Spates, patient needs to get clearance to come off of his anticoagulation from cardiology prior to thoracic epidural steroid injection

## 2015-04-23 ENCOUNTER — Encounter: Payer: Self-pay | Admitting: Cardiology

## 2015-04-23 NOTE — Assessment & Plan Note (Signed)
Nonobstructive CAD on cardiac catheterization. Likely false positive stress test. Probably diaphragmatic attenuation after exertion with large gastric air bubble. Procedure stress test modality, preferred to use Treadmill Exercise Echocardiogram Stress Test.

## 2015-04-23 NOTE — Assessment & Plan Note (Signed)
Asymptomatic without signs of incompetence. Avoid oral agents. Monitor for worsening bradycardia/symptomatic bradycardia.

## 2015-04-23 NOTE — Telephone Encounter (Signed)
Order placed for referral to Dr Ernestina Patches  Left message to call office so that we may notify [name] [name] that he will need to be cleared to come off anticoagulant before injection

## 2015-04-23 NOTE — Assessment & Plan Note (Signed)
No issues with CPAP. No evidence of elevated pulmonary artery pressures on echo

## 2015-04-23 NOTE — Telephone Encounter (Signed)
[name] [name] called back and I informed him of the referral.  He is not on an anticoagulant any longer according to him.

## 2015-04-23 NOTE — Progress Notes (Signed)
PCP: [name], MD  Clinic Note: Chief Complaint  Patient presents with  . Follow-up    pt states RT wrist and hand swelling, some aching when he twists it for a minute or two then goes away  . Atrial Fibrillation    HPI: [name] [name] is a 74 y.o. male with a PMH below who presents today for post cardiac cath.  As you recall he is a very pleasant gentleman who I initially saw for consultation new diagnosis of atrial fibrillation in the setting of chronic bradycardia. He's been relatively asymptomatic. Unfortunately his Myoview stress test revealed possible inferior defect. Therefore, he underwent cardiac catheterization last week. This revealed only mild nonobstructive CAD. Unfortunately radial access was unsuccessful due to radial spasm, and he had some significant bruising from femoral access.  Past Medical History  Diagnosis Date  . GERD (gastroesophageal reflux disease)     controlled w/ diet and behavioral changes  . BPH (benign prostatic hypertrophy)   . Elevated PSA   . Shingles 07/09/2013  . Encounter for Medicare annual wellness exam 09/22/2013    Sees Dr Wilhemina Bonito of Derm Sees Dr Silvano Rusk of Gastroenterology Sees Dr Kathie Rhodes of [facility] Sees Dr Susa Day of Optometry  . Snoring disorder 07/30/2011  . Sleep apnea 07/30/2011    cpap- 13   . Bradycardia 12/30/2014    HR routinely in mid 40s-50s  . New onset a-fib 03/10/2015  . Hypertriglyceridemia 03/16/2015  . White coat hypertension   . Melanoma of back     Excised Dr Wilhemina Bonito  . Coronary artery disease (CAD) excluded April 2016    False-positive nuclear stress test suggesting inferior ischemia    Prior Cardiac Evaluation and Past Surgical History: Past Surgical History  Procedure Laterality Date  . Mass excision N/A 05/16/2014    Procedure: EXCISION POSTERIOR NECK MASS, RIGHT CHEST WALL MASS AND RIGHT AXILLARY MASS AXILLARY NODE DISSECTION;  Surgeon: Adin Hector, MD;  Location: [facility];   Service: General;  Laterality: N/A;  . Transthoracic echocardiogram  03/20/2015    Normal LV size and function. EF 60-65%. G1 DD. Trivial AI. Borderline aortic root dilation (41 mm), Mild LA dilation.  Marland Kitchen Nm myoview ltd  03/19/2015    FALSE POSITIVE:  INTERMEDIATE RISK. Small sized, moderate intensity inferior ischemic perfusion defect  . Left heart catheterization with coronary angiogram N/A 04/10/2015    Procedure: LEFT HEART CATHETERIZATION WITH  CORONARY ANGIOGRAM;  Surgeon: Leonie Man, MD;  Location: [facility] CATH LAB;  Service: Cardiovascular;  Angiographicallly NORMAL CORONARY ARTERIES    Interval History: he presents today really without any major symptoms. The bruising in his leg has improved significantly. Now is almost all gone. He did episode this weekend while at the beach in the sun he became quite weak and clammy and dizzy. They contacted their daughter [name] RPH-CCP from our office) who recommended aggressive oral hydration and cooling down period he also ate bananas and potatoes chips. While in hydration he felt much better and has not had any further episodes. He did not actually have any near syncope just as he was very weak. No TIA or amaurosis fugax symptoms. He still doesn't notice that he is in or out of atrial fibrillation, with no sensation of palpitations. He recollects in the past having felt some intermittent palpitations especially after coughing. He now decided to just convert to decaf coffee.  No melena, hematochezia, hematuria, or epstaxis. No claudication.  ROS: A  comprehensive was performed. Review of Systems  Constitutional: Negative for malaise/fatigue.  HENT: Positive for congestion.   Respiratory: Negative for cough, shortness of breath and wheezing.   Cardiovascular: Negative.  Negative for claudication.       Per history of present illness  Gastrointestinal: Negative for blood in stool and melena.  Genitourinary: Negative for hematuria and flank  pain.  Musculoskeletal: Negative.   Neurological: Negative for weakness.       As noted in history of present illness with dizziness  Endo/Heme/Allergies:       Healing bruise from femoral access  Psychiatric/Behavioral: Negative.   All other systems reviewed and are negative.   Current Outpatient Prescriptions on File Prior to Visit  Medication Sig Dispense Refill  . aspirin 81 MG chewable tablet Chew 81 mg by mouth every Monday, Wednesday, and Friday.     . Cholecalciferol (VITAMIN D) 2000 UNITS CAPS Take 1 capsule by mouth daily.    Marland Kitchen gabapentin (NEURONTIN) 300 MG capsule TAKE THREE CAPSULES BY MOUTH THREE TIMES DAILY 270 capsule 2  . KRILL OIL PO Take 1 capsule by mouth daily.    . Multiple Vitamin (MULTIVITAMIN WITH MINERALS) TABS tablet Take 1 tablet by mouth daily.     No current facility-administered medications on file prior to visit.   No Known Allergies   History  Substance Use Topics  . Smoking status: Former Smoker    Quit date: 02/11/1989  . Smokeless tobacco: Never Used  . Alcohol Use: 4.2 oz/week    7 Glasses of wine per week     Comment: 1 per day -- brandy or wine   Family History  Problem Relation Age of Onset  . Cancer Mother     MM, leukemia  . Emphysema Father   . Cancer Sister     brain  . Alcohol abuse Other   . COPD Brother   . Heart disease Paternal Grandmother   . COPD Paternal Grandfather   . Diabetes Sister   . Obesity Sister   . Down syndrome Brother   . Colon cancer Neg Hx     Wt Readings from Last 3 Encounters:  04/21/15 231 lb 1.6 oz (104.826 kg)  03/31/15 228 lb (103.42 kg)  03/19/15 229 lb (103.874 kg)    PHYSICAL EXAM BP 112/82 mmHg  Pulse 79  Ht 5\' 10"  (1.778 m)  Wt 231 lb 1.6 oz (104.826 kg)  BMI 33.16 kg/m2 General appearance: alert, cooperative, appears stated age, no distress and well-nourished, well-groomed. Healthy-appearing. HEENT: Catharine/AT, EOMI, MMM, anicteric sclera; has a beard. Neck: no adenopathy, no carotid  bruit, no JVD,  Lungs: clear to auscultation bilaterally, normal percussion bilaterally and nonlabored, good air movement Heart: bradycardic with regular rhythm, nondisplaced PMI, normal S1-S2 with no M/R/G. Abdomen: soft, non-tender; bowel sounds normal; no masses, no organomegaly Extremities: extremities normal, atraumatic, no cyanosis or edema and no edema; Pulses: 2+ and symmetric; resolving ecchymosis in the right groin area. Skin: Skin color, texture, turgor normal. No rashes or lesions Neurologic: Alert and oriented X 3, normal strength and tone. Normal symmetric reflexes. Normal coordination and gait   Adult ECG Report - not checked   ASSESSMENT / PLAN: Problem List Items Addressed This Visit    Abnormal exercise myocardial perfusion study - Primary    Nonobstructive CAD on cardiac catheterization. Likely false positive stress test. Probably diaphragmatic attenuation after exertion with large gastric air bubble. Procedure stress test modality, preferred to use Treadmill Exercise Echocardiogram Stress Test.  Bradycardia (Chronic)    Asymptomatic without signs of incompetence. Avoid oral agents. Monitor for worsening bradycardia/symptomatic bradycardia.      OSA on CPAP (Chronic)    No issues with CPAP. No evidence of elevated pulmonary artery pressures on echo      Paroxysmal atrial fibrillation - auto rate control (Chronic)    Essentially low in A. Fib now with CHADS2VASC2 of only 1 for age.   He is rate controlled without any AV nodal agent. We will simply use aspirin for stroke prevention.          Followup: one year    Sherri Levenhagen, Leonie Green, M.D., M.S. Interventional Cardiologist   Pager # [phone]

## 2015-04-23 NOTE — Assessment & Plan Note (Signed)
Essentially low in A. Fib now with CHADS2VASC2 of only 1 for age.   He is rate controlled without any AV nodal agent. We will simply use aspirin for stroke prevention.

## 2015-05-05 ENCOUNTER — Ambulatory Visit: Payer: PRIVATE HEALTH INSURANCE | Admitting: Cardiology

## 2015-05-05 DIAGNOSIS — M5414 Radiculopathy, thoracic region: Secondary | ICD-10-CM | POA: Diagnosis not present

## 2015-05-08 DIAGNOSIS — D2262 Melanocytic nevi of left upper limb, including shoulder: Secondary | ICD-10-CM | POA: Diagnosis not present

## 2015-05-08 DIAGNOSIS — D225 Melanocytic nevi of trunk: Secondary | ICD-10-CM | POA: Diagnosis not present

## 2015-05-08 DIAGNOSIS — Z85820 Personal history of malignant melanoma of skin: Secondary | ICD-10-CM | POA: Diagnosis not present

## 2015-05-08 DIAGNOSIS — D1801 Hemangioma of skin and subcutaneous tissue: Secondary | ICD-10-CM | POA: Diagnosis not present

## 2015-05-08 DIAGNOSIS — D2271 Melanocytic nevi of right lower limb, including hip: Secondary | ICD-10-CM | POA: Diagnosis not present

## 2015-05-08 DIAGNOSIS — D2272 Melanocytic nevi of left lower limb, including hip: Secondary | ICD-10-CM | POA: Diagnosis not present

## 2015-05-08 DIAGNOSIS — L570 Actinic keratosis: Secondary | ICD-10-CM | POA: Diagnosis not present

## 2015-05-22 ENCOUNTER — Other Ambulatory Visit: Payer: Self-pay | Admitting: Family Medicine

## 2015-05-29 ENCOUNTER — Encounter: Payer: Self-pay | Admitting: Family Medicine

## 2015-05-29 ENCOUNTER — Ambulatory Visit (INDEPENDENT_AMBULATORY_CARE_PROVIDER_SITE_OTHER): Payer: Medicare Other | Admitting: Family Medicine

## 2015-05-29 VITALS — BP 110/70 | HR 69 | Temp 97.8°F | Ht 70.0 in | Wt 234.5 lb

## 2015-05-29 DIAGNOSIS — R030 Elevated blood-pressure reading, without diagnosis of hypertension: Secondary | ICD-10-CM | POA: Diagnosis not present

## 2015-05-29 DIAGNOSIS — N400 Enlarged prostate without lower urinary tract symptoms: Secondary | ICD-10-CM

## 2015-05-29 DIAGNOSIS — E785 Hyperlipidemia, unspecified: Secondary | ICD-10-CM | POA: Diagnosis not present

## 2015-05-29 DIAGNOSIS — R001 Bradycardia, unspecified: Secondary | ICD-10-CM

## 2015-05-29 DIAGNOSIS — IMO0001 Reserved for inherently not codable concepts without codable children: Secondary | ICD-10-CM

## 2015-05-29 DIAGNOSIS — E781 Pure hyperglyceridemia: Secondary | ICD-10-CM

## 2015-05-29 MED ORDER — DIAZEPAM 5 MG PO TABS: 2.5000 mg | ORAL_TABLET | Freq: Every evening | ORAL | Status: DC

## 2015-05-29 MED ORDER — GABAPENTIN 300 MG PO CAPS: 900.0000 mg | ORAL_CAPSULE | Freq: Three times a day (TID) | ORAL | Status: DC

## 2015-05-29 NOTE — Progress Notes (Signed)
Pre visit review using our clinic review tool, if applicable. No additional management support is needed unless otherwise documented below in the visit note. 

## 2015-05-29 NOTE — Patient Instructions (Signed)

## 2015-06-08 ENCOUNTER — Encounter: Payer: Self-pay | Admitting: Family Medicine

## 2015-06-08 NOTE — Assessment & Plan Note (Signed)
RRR today 

## 2015-06-08 NOTE — Progress Notes (Signed)
[name] [name]  [id number] 1941-07-11 06/08/2015      Progress Note-Follow Up  Subjective  Chief Complaint  Chief Complaint  Patient presents with  . Follow-up    HPI  Patient is a 74 y.o. male in today for routine medical care. Patient is in today for follow up. Is improved since last visit. Continues to struggle with back and chest wall pain but manages with minimal meds. No recent illness. No acute concerns. Denies CP/palp/SOB/HA/congestion/fevers/GI or GU c/o. Taking meds as prescribed   Past Medical History  Diagnosis Date  . GERD (gastroesophageal reflux disease)     controlled w/ diet and behavioral changes  . BPH (benign prostatic hypertrophy)   . Elevated PSA   . Shingles 07/09/2013  . Encounter for Medicare annual wellness exam 09/22/2013    Sees Dr Wilhemina Bonito of Derm Sees Dr Silvano Rusk of Gastroenterology Sees Dr Kathie Rhodes of [facility] Sees Dr Susa Day of Optometry  . Snoring disorder 07/30/2011  . Sleep apnea 07/30/2011    cpap- 13   . Bradycardia 12/30/2014    HR routinely in mid 40s-50s  . New onset a-fib 03/10/2015  . Hypertriglyceridemia 03/16/2015  . White coat hypertension   . Melanoma of back     Excised Dr Wilhemina Bonito  . Coronary artery disease (CAD) excluded April 2016    False-positive nuclear stress test suggesting inferior ischemia    Past Surgical History  Procedure Laterality Date  . Mass excision N/A 05/16/2014    Procedure: EXCISION POSTERIOR NECK MASS, RIGHT CHEST WALL MASS AND RIGHT AXILLARY MASS AXILLARY NODE DISSECTION;  Surgeon: Adin Hector, MD;  Location: [facility];  Service: General;  Laterality: N/A;  . Transthoracic echocardiogram  03/20/2015    Normal LV size and function. EF 60-65%. G1 DD. Trivial AI. Borderline aortic root dilation (41 mm), Mild LA dilation.  Marland Kitchen Nm myoview ltd  03/19/2015    FALSE POSITIVE:  INTERMEDIATE RISK. Small sized, moderate intensity inferior ischemic perfusion defect  . Left heart  catheterization with coronary angiogram N/A 04/10/2015    Procedure: LEFT HEART CATHETERIZATION WITH  CORONARY ANGIOGRAM;  Surgeon: Leonie Man, MD;  Location: [facility] CATH LAB;  Service: Cardiovascular;  Angiographicallly NORMAL CORONARY ARTERIES    Family History  Problem Relation Age of Onset  . Cancer Mother     MM, leukemia  . Emphysema Father   . Cancer Sister     brain  . Alcohol abuse Other   . COPD Brother   . Heart disease Paternal Grandmother   . COPD Paternal Grandfather   . Diabetes Sister   . Obesity Sister   . Down syndrome Brother   . Colon cancer Neg Hx     History   Social History  . Marital Status: Married    Spouse Name: [name]  . Number of Children: 2  . Years of Education: N/A   Occupational History  . retired     Administrator   Social History Main Topics  . Smoking status: Former Smoker    Quit date: 02/11/1989  . Smokeless tobacco: Never Used  . Alcohol Use: 4.2 oz/week    7 Glasses of wine per week     Comment: 1 per day -- brandy or wine  . Drug Use: No  . Sexual Activity: Not on file     Comment: lives with wife, retired from [employer] driving, no dietary restrictions   Other Topics Concern  . Not on file  Social History Narrative   He is a married father of 2, and grandfather of 5. He has been married to his wife [name] for 51 years. He previously lived in [place] but has moved with his wife to [place], [place] to be close to his daughter [name] and her family. [name] is our Nurse, adult.   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 He quit smoking in 1990. He has 6-7 glasses of brandy or wine a week.    He exercises regularly with low impact aerobics 2 days a week and daily walks of 30-45 minutes. His exercise sessions or 60 minutes. He will do some type of exercise at least 7 days a week and is very active doing yard work and wood work.    Current Outpatient Prescriptions on File Prior  to Visit  Medication Sig Dispense Refill  . aspirin 81 MG chewable tablet Chew 81 mg by mouth every Monday, Wednesday, and Friday.     . Cholecalciferol (VITAMIN D) 2000 UNITS CAPS Take 1 capsule by mouth daily.    Marland Kitchen KRILL OIL PO Take 1 capsule by mouth daily.    . Multiple Vitamin (MULTIVITAMIN WITH MINERALS) TABS tablet Take 1 tablet by mouth daily.     No current facility-administered medications on file prior to visit.    No Known Allergies  Review of Systems  Review of Systems  Constitutional: Negative for fever and malaise/fatigue.  HENT: Negative for congestion.   Eyes: Negative for discharge.  Respiratory: Negative for shortness of breath.   Cardiovascular: Negative for chest pain, palpitations and leg swelling.  Gastrointestinal: Negative for nausea, abdominal pain and diarrhea.  Genitourinary: Negative for dysuria.  Musculoskeletal: Positive for back pain. Negative for falls.  Skin: Negative for rash.  Neurological: Negative for loss of consciousness and headaches.  Endo/Heme/Allergies: Negative for polydipsia.  Psychiatric/Behavioral: Negative for depression and suicidal ideas. The patient is not nervous/anxious and does not have insomnia.     Objective  BP 110/70 mmHg  Pulse 69  Temp(Src) 97.8 F (36.6 C) (Oral)  Ht 5\' 10"  (1.778 m)  Wt 234 lb 8 oz (106.369 kg)  BMI 33.65 kg/m2  SpO2 96%  Physical Exam  Physical Exam  Constitutional: He is oriented to person, place, and time and well-developed, well-nourished, and in no distress. No distress.  HENT:  Head: Normocephalic and atraumatic.  Eyes: Conjunctivae are normal.  Neck: Neck supple. No thyromegaly present.  Cardiovascular: Normal rate, regular rhythm and normal heart sounds.   No murmur heard. Pulmonary/Chest: Effort normal and breath sounds normal. No respiratory distress.  Abdominal: He exhibits no distension and no mass. There is no tenderness.  Musculoskeletal: He exhibits no edema.    Neurological: He is alert and oriented to person, place, and time.  Skin: Skin is warm.  Psychiatric: Memory, affect and judgment normal.    Lab Results  Component Value Date   TSH 2.65 03/10/2015   Lab Results  Component Value Date   WBC 8.5 04/04/2015   HGB 13.8 04/04/2015   HCT 43.1 04/04/2015   MCV 89.8 04/04/2015   PLT 244 04/04/2015   Lab Results  Component Value Date   CREATININE 0.92 04/04/2015   BUN 20 04/04/2015   NA 141 04/04/2015   K 4.6 04/04/2015   CL 106 04/04/2015   CO2 28 04/04/2015   Lab Results  Component Value Date   ALT 58* 03/10/2015   AST 27 03/10/2015  ALKPHOS 60 03/10/2015   BILITOT 0.4 03/10/2015   Lab Results  Component Value Date   CHOL 158 03/10/2015   Lab Results  Component Value Date   HDL 48.00 03/10/2015   Lab Results  Component Value Date   LDLCALC 79 03/10/2015   Lab Results  Component Value Date   TRIG 154.0* 03/10/2015   Lab Results  Component Value Date   CHOLHDL 3 03/10/2015     Assessment & Plan  GERD Avoid offending foods, start probiotics. Do not eat large meals in late evening and consider raising head of bed.   ELEVATED BP READING WITHOUT DX HYPERTENSION Well controlled, no changes to meds. Encouraged heart healthy diet such as the DASH diet and exercise as tolerated.   Bradycardia RRR today  Hypertriglyceridemia .sbl

## 2015-06-08 NOTE — Assessment & Plan Note (Signed)
Well controlled, no changes to meds. Encouraged heart healthy diet such as the DASH diet and exercise as tolerated.  °

## 2015-06-08 NOTE — Assessment & Plan Note (Signed)
.  sbl 

## 2015-06-08 NOTE — Assessment & Plan Note (Signed)
Avoid offending foods, start probiotics. Do not eat large meals in late evening and consider raising head of bed.  

## 2015-07-22 ENCOUNTER — Other Ambulatory Visit (INDEPENDENT_AMBULATORY_CARE_PROVIDER_SITE_OTHER): Payer: Medicare Other

## 2015-07-22 DIAGNOSIS — R030 Elevated blood-pressure reading, without diagnosis of hypertension: Secondary | ICD-10-CM | POA: Diagnosis not present

## 2015-07-22 DIAGNOSIS — IMO0001 Reserved for inherently not codable concepts without codable children: Secondary | ICD-10-CM

## 2015-07-22 DIAGNOSIS — N400 Enlarged prostate without lower urinary tract symptoms: Secondary | ICD-10-CM

## 2015-07-22 DIAGNOSIS — E785 Hyperlipidemia, unspecified: Secondary | ICD-10-CM

## 2015-07-22 LAB — COMPREHENSIVE METABOLIC PANEL
ALT: 28 U/L (ref 0–53)
AST: 18 U/L (ref 0–37)
Albumin: 3.8 g/dL (ref 3.5–5.2)
Alkaline Phosphatase: 54 U/L (ref 39–117)
BILIRUBIN TOTAL: 0.6 mg/dL (ref 0.2–1.2)
BUN: 17 mg/dL (ref 6–23)
CALCIUM: 9 mg/dL (ref 8.4–10.5)
CHLORIDE: 106 meq/L (ref 96–112)
CO2: 27 mEq/L (ref 19–32)
CREATININE: 0.99 mg/dL (ref 0.40–1.50)
GFR: 78.86 mL/min (ref >60.00)
GLUCOSE: 102 mg/dL — AB (ref 70–99)
Potassium: 4.3 mEq/L (ref 3.5–5.1)
SODIUM: 139 meq/L (ref 135–145)
Total Protein: 6.8 g/dL (ref 6.0–8.3)

## 2015-07-22 LAB — CBC
HEMATOCRIT: 40.6 % (ref 39.0–52.0)
Hemoglobin: 13.4 g/dL (ref 13.0–17.0)
MCHC: 32.9 g/dL (ref 30.0–36.0)
MCV: 88 fl (ref 78.0–100.0)
PLATELETS: 219 10*3/uL (ref 150.0–400.0)
RBC: 4.61 Mil/uL (ref 4.22–5.81)
RDW: 16.3 % — ABNORMAL HIGH (ref 11.5–15.5)
WBC: 8.8 10*3/uL (ref 4.0–10.5)

## 2015-07-22 LAB — LIPID PANEL
CHOL/HDL RATIO: 3
Cholesterol: 133 mg/dL (ref 0–200)
HDL: 44.7 mg/dL (ref >39.00)
LDL Cholesterol: 75 mg/dL (ref 0–99)
NonHDL: 88.3
Triglycerides: 67 mg/dL (ref 0.0–149.0)
VLDL: 13.4 mg/dL (ref 0.0–40.0)

## 2015-07-22 LAB — TSH: TSH: 1.99 u[IU]/mL (ref 0.35–4.50)

## 2015-07-25 ENCOUNTER — Telehealth: Payer: Self-pay | Admitting: *Deleted

## 2015-07-25 NOTE — Telephone Encounter (Signed)
Megan from Dr Midge Aver office called asiking for a call back in reference to [name] [name].  I called and had to leave a VM.  We have not seen him since March and there is no follow up appt and we are not prescribing meds.  She can call back if she needs more information.

## 2015-07-25 NOTE — Telephone Encounter (Addendum)
Megan called back,  They gave [name] [name] the injection in May and it did not help.  The daughter has called back and requested another injection for him even though it did not help.  Dr Ernestina Patches would like to speak with you about whether you want him to do another injection.

## 2015-07-25 NOTE — Telephone Encounter (Signed)
I sent a message to Dr Ernestina Patches.  I really just did a consult on this patient.  If the thoracic ESI was not helpful I wouldn't recommend another

## 2015-07-29 ENCOUNTER — Encounter: Payer: Self-pay | Admitting: Family Medicine

## 2015-07-29 ENCOUNTER — Ambulatory Visit (INDEPENDENT_AMBULATORY_CARE_PROVIDER_SITE_OTHER): Payer: Medicare Other | Admitting: Family Medicine

## 2015-07-29 VITALS — BP 112/82 | HR 85 | Temp 98.1°F | Ht 70.0 in | Wt 235.4 lb

## 2015-07-29 DIAGNOSIS — Z9989 Dependence on other enabling machines and devices: Secondary | ICD-10-CM

## 2015-07-29 DIAGNOSIS — E781 Pure hyperglyceridemia: Secondary | ICD-10-CM

## 2015-07-29 DIAGNOSIS — H60391 Other infective otitis externa, right ear: Secondary | ICD-10-CM

## 2015-07-29 DIAGNOSIS — R001 Bradycardia, unspecified: Secondary | ICD-10-CM | POA: Diagnosis not present

## 2015-07-29 DIAGNOSIS — R030 Elevated blood-pressure reading, without diagnosis of hypertension: Secondary | ICD-10-CM

## 2015-07-29 DIAGNOSIS — K219 Gastro-esophageal reflux disease without esophagitis: Secondary | ICD-10-CM | POA: Diagnosis not present

## 2015-07-29 DIAGNOSIS — G4733 Obstructive sleep apnea (adult) (pediatric): Secondary | ICD-10-CM | POA: Diagnosis not present

## 2015-07-29 MED ORDER — DIAZEPAM 5 MG PO TABS: 2.5000 mg | ORAL_TABLET | Freq: Every evening | ORAL | Status: DC

## 2015-07-29 MED ORDER — GABAPENTIN 300 MG PO CAPS: 900.0000 mg | ORAL_CAPSULE | Freq: Three times a day (TID) | ORAL | Status: DC

## 2015-07-29 MED ORDER — NEOMYCIN-POLYMYXIN-HC 3.5-10000-1 OT SOLN: 4.0000 [drp] | Freq: Three times a day (TID) | OTIC | Status: DC

## 2015-07-29 NOTE — Patient Instructions (Signed)
Probiotics daily such as Digestive Advantage or [facility] or online at [email or web address], [place] 10 strain 1 cap daily Vitamin D 2000 IU daily Curcumen cap daily Krill oil cap daily Heat patches   Otitis Externa Otitis externa is a bacterial or fungal infection of the outer ear canal. This is the area from the eardrum to the outside of the ear. Otitis externa is sometimes called "swimmer's ear." CAUSES  Possible causes of infection include:  Swimming in dirty water.  Moisture remaining in the ear after swimming or bathing.  Mild injury (trauma) to the ear.  Objects stuck in the ear (foreign body).  Cuts or scrapes (abrasions) on the outside of the ear. SIGNS AND SYMPTOMS  The first symptom of infection is often itching in the ear canal. Later signs and symptoms may include swelling and redness of the ear canal, ear pain, and yellowish-white fluid (pus) coming from the ear. The ear pain may be worse when pulling on the earlobe. DIAGNOSIS  Your health care provider will perform a physical exam. A sample of fluid may be taken from the ear and examined for bacteria or fungi. TREATMENT  Antibiotic ear drops are often given for 10 to 14 days. Treatment may also include pain medicine or corticosteroids to reduce itching and swelling. HOME CARE INSTRUCTIONS   Apply antibiotic ear drops to the ear canal as prescribed by your health care provider.  Take medicines only as directed by your health care provider.  If you have diabetes, follow any additional treatment instructions from your health care provider.  Keep all follow-up visits as directed by your health care provider. PREVENTION   Keep your ear dry. Use the corner of a towel to absorb water out of the ear canal after swimming or bathing.  Avoid scratching or putting objects inside your ear. This can damage the ear canal or remove the protective wax that lines the canal. This makes it easier for bacteria and fungi  to grow.  Avoid swimming in lakes, polluted water, or poorly chlorinated pools.  You may use ear drops made of rubbing alcohol and vinegar after swimming. Combine equal parts of white vinegar and alcohol in a bottle. Put 3 or 4 drops into each ear after swimming. SEEK MEDICAL CARE IF:   You have a fever.  Your ear is still red, swollen, painful, or draining pus after 3 days.  Your redness, swelling, or pain gets worse.  You have a severe headache.  You have redness, swelling, pain, or tenderness in the area behind your ear. MAKE SURE YOU:   Understand these instructions.  Will watch your condition.  Will get help right away if you are not doing well or get worse. Document Released: 12/13/2005 Document Revised: 04/29/2014 Document Reviewed: 12/30/2011 [facility] Patient Information 2015 [place], [place]. This information is not intended to replace advice given to you by your health care provider. Make sure you discuss any questions you have with your health care provider.

## 2015-07-29 NOTE — Progress Notes (Signed)
Pre visit review using our clinic review tool, if applicable. No additional management support is needed unless otherwise documented below in the visit note. 

## 2015-07-30 ENCOUNTER — Telehealth: Payer: Self-pay | Admitting: Family Medicine

## 2015-07-30 NOTE — Telephone Encounter (Signed)
Pt has a history of obstructive sleep apnea. Please assist.

## 2015-07-30 NOTE — Telephone Encounter (Signed)
Relation to pt: self  Call back number: [phone]   Reason for call: as per patient c pap manufacture has changed to [place]. [facility] requesting as per patient supporting notes stating why patient needs c-pap and insurance information and rx for c-pap machine.  [place] fax # [phone] and telephone # [phone]

## 2015-07-30 NOTE — Telephone Encounter (Signed)
Check with patient and find out what kind of machine and mask he uses. Then check with [facility] and see if they need anything specific in the prescription.

## 2015-07-31 NOTE — Telephone Encounter (Signed)
Spoke with [facility]. Pt will need a copy of his sleep study and the demographics from appointment to establish an account with [facility].

## 2015-08-05 ENCOUNTER — Telehealth: Payer: Self-pay | Admitting: Family Medicine

## 2015-08-05 NOTE — Telephone Encounter (Signed)
Caller name: Mekel Haverstock Relationship to patient: self  Can be reached: [phone] Pharmacy:  Reason for call: Pt need to speak with you about his C Pap machine. He would like to go into details with you.

## 2015-08-05 NOTE — Telephone Encounter (Signed)
Patient call back today 08/05/15 to inform [facility] has never received information requested. Faxed Sleep Study Results, demographis and insurance card to [place] [facility] at [phone].  Patient aware.

## 2015-08-05 NOTE — Telephone Encounter (Signed)
Returned his call.  Faxed to [facility] sleep study results, demographics and insurance information as patient stated they had not yet received. Faxed to [phone]

## 2015-08-06 DIAGNOSIS — M5414 Radiculopathy, thoracic region: Secondary | ICD-10-CM | POA: Diagnosis not present

## 2015-08-07 NOTE — Telephone Encounter (Signed)
Prescription was written and faxed.

## 2015-08-10 ENCOUNTER — Encounter: Payer: Self-pay | Admitting: Family Medicine

## 2015-08-10 DIAGNOSIS — H60399 Other infective otitis externa, unspecified ear: Secondary | ICD-10-CM | POA: Insufficient documentation

## 2015-08-10 HISTORY — DX: Other infective otitis externa, unspecified ear: H60.399

## 2015-08-10 NOTE — Assessment & Plan Note (Signed)
Encouraged heart healthy diet, increase exercise, avoid trans fats, consider a krill oil cap daily 

## 2015-08-10 NOTE — Assessment & Plan Note (Signed)
Started on ear drops and call if no improvement

## 2015-08-10 NOTE — Progress Notes (Signed)
Patient ID: [name] [name], male    DOB: Jan 05, 1941  Age: 74 y.o. MRN: [id number]    Subjective:  Subjective HPI CAPERS HAGMANN presents for follow up on numerous concerns. Is noting some intermitttent ear pain worse with palpation, improving.. No fevers, chills, hearing loss. No injury. Some swelling noed. Has had temporary relief of Afrin and Swimmer's ear. Denies CP/palp/SOB/HA/congestion/fevers/GI or GU c/o. Taking meds as prescribed  Review of Systems  Constitutional: Negative.   HENT: Positive for congestion and ear pain. Negative for ear discharge, nosebleeds and sinus pressure.   Respiratory: Negative.   Cardiovascular: Negative.   Gastrointestinal: Negative.   Endocrine: Negative.   Genitourinary: Negative.   Musculoskeletal:       Persistent chest wall pain noted. stable  Allergic/Immunologic: Negative.   Hematological: Negative.   Psychiatric/Behavioral: Negative.     History Past Medical History  Diagnosis Date  . GERD (gastroesophageal reflux disease)     controlled w/ diet and behavioral changes  . BPH (benign prostatic hypertrophy)   . Elevated PSA   . Shingles 07/09/2013  . Encounter for Medicare annual wellness exam 09/22/2013    Sees Dr Wilhemina Bonito of Derm Sees Dr Silvano Rusk of Gastroenterology Sees Dr Kathie Rhodes of [facility] Sees Dr Susa Day of Optometry  . Snoring disorder 07/30/2011  . Sleep apnea 07/30/2011    cpap- 13   . Bradycardia 12/30/2014    HR routinely in mid 40s-50s  . New onset a-fib 03/10/2015  . Hypertriglyceridemia 03/16/2015  . White coat hypertension   . Melanoma of back     Excised Dr Wilhemina Bonito  . Coronary artery disease (CAD) excluded April 2016    False-positive nuclear stress test suggesting inferior ischemia  . Otitis, externa, infective 08/10/2015    He has past surgical history that includes Mass excision (N/A, 05/16/2014); transthoracic echocardiogram (03/20/2015); NM MYOVIEW LTD (03/19/2015); and left heart  catheterization with coronary angiogram (N/A, 04/10/2015).   His family history includes Alcohol abuse in his other; COPD in his brother and paternal grandfather; Cancer in his mother and sister; Diabetes in his sister; Down syndrome in his brother; Emphysema in his father; Heart disease in his paternal grandmother; Obesity in his sister. There is no history of Colon cancer.He reports that he quit smoking about 26 years ago. He has never used smokeless tobacco. He reports that he drinks about 4.2 oz of alcohol per week. He reports that he does not use illicit drugs.  Current Outpatient Prescriptions on File Prior to Visit  Medication Sig Dispense Refill  . aspirin 81 MG chewable tablet Chew 81 mg by mouth every Monday, Wednesday, and Friday.     . Cholecalciferol (VITAMIN D) 2000 UNITS CAPS Take 1 capsule by mouth daily.    Marland Kitchen KRILL OIL PO Take 1 capsule by mouth daily.    . Multiple Vitamin (MULTIVITAMIN WITH MINERALS) TABS tablet Take 1 tablet by mouth daily.     No current facility-administered medications on file prior to visit.     Objective:  Objective Physical Exam  Constitutional: He is oriented to person, place, and time. He appears well-developed and well-nourished. No distress.  HENT:  Head: Normocephalic and atraumatic.  Nose: Nose normal.  Right external ear canal mildly swollen and erythematous. TM in place.  Eyes: Right eye exhibits no discharge. Left eye exhibits no discharge.  Neck: Normal range of motion. Neck supple.  Cardiovascular: Normal rate and regular rhythm.   No murmur heard. Pulmonary/Chest: Effort normal  and breath sounds normal.  Abdominal: Soft. Bowel sounds are normal. There is no tenderness.  Musculoskeletal: He exhibits no edema.  Neurological: He is alert and oriented to person, place, and time.  Skin: Skin is warm and dry.  Psychiatric: He has a normal mood and affect.  Nursing note and vitals reviewed.  BP 112/82 mmHg  Pulse 85  Temp(Src) 98.1 F  (36.7 C) (Oral)  Ht 5\' 10"  (1.778 m)  Wt 235 lb 6 oz (106.765 kg)  BMI 33.77 kg/m2  SpO2 95% Wt Readings from Last 3 Encounters:  07/29/15 235 lb 6 oz (106.765 kg)  05/29/15 234 lb 8 oz (106.369 kg)  04/21/15 231 lb 1.6 oz (104.826 kg)     Lab Results  Component Value Date   WBC 8.8 07/22/2015   HGB 13.4 07/22/2015   HCT 40.6 07/22/2015   PLT 219.0 07/22/2015   GLUCOSE 102* 07/22/2015   CHOL 133 07/22/2015   TRIG 67.0 07/22/2015   HDL 44.70 07/22/2015   LDLCALC 75 07/22/2015   ALT 28 07/22/2015   AST 18 07/22/2015   NA 139 07/22/2015   K 4.3 07/22/2015   CL 106 07/22/2015   CREATININE 0.99 07/22/2015   BUN 17 07/22/2015   CO2 27 07/22/2015   TSH 1.99 07/22/2015   PSA 4.94* 01/15/2010   INR 1.22 04/04/2015    No results found.   Assessment & Plan:  Plan I am having Mr. Zayed start on neomycin-polymyxin-hydrocortisone. I am also having him maintain his aspirin, multivitamin with minerals, KRILL OIL PO, Vitamin D, gabapentin, and diazepam.  Meds ordered this encounter  Medications  . gabapentin (NEURONTIN) 300 MG capsule    Sig: Take 3 capsules (900 mg total) by mouth 3 (three) times daily.    Dispense:  270 capsule    Refill:  2    D/C PREVIOUS SCRIPTS FOR THIS MEDICATION  . diazepam (VALIUM) 5 MG tablet    Sig: Take 0.5-2 tablets (2.5-10 mg total) by mouth every evening. As needed    Dispense:  30 tablet    Refill:  1  . neomycin-polymyxin-hydrocortisone (CORTISPORIN) otic solution    Sig: Place 4 drops into the right ear 3 (three) times daily.    Dispense:  10 mL    Refill:  0    Problem List Items Addressed This Visit    Otitis, externa, infective    Started on ear drops and call if no improvement      OSA on CPAP (Chronic)    Uses CPAP regularly      Hypertriglyceridemia (Chronic)    Encouraged heart healthy diet, increase exercise, avoid trans fats, consider a krill oil cap daily      GERD    Avoid offending foods, start probiotics. Do not  eat large meals in late evening and consider raising head of bed.       ELEVATED BP READING WITHOUT DX HYPERTENSION    Well controlled. Encouraged heart healthy diet such as the DASH diet and exercise as tolerated.       Bradycardia - Primary (Chronic)    RRR today         Follow-up: Return in about 3 months (around 10/29/2015).  [name], MD

## 2015-08-10 NOTE — Assessment & Plan Note (Signed)
Uses CPAP regularly °

## 2015-08-10 NOTE — Assessment & Plan Note (Signed)
Avoid offending foods, start probiotics. Do not eat large meals in late evening and consider raising head of bed.  

## 2015-08-10 NOTE — Assessment & Plan Note (Signed)
RRR today 

## 2015-08-10 NOTE — Assessment & Plan Note (Signed)
Well controlled. Encouraged heart healthy diet such as the DASH diet and exercise as tolerated.  

## 2015-09-05 ENCOUNTER — Ambulatory Visit (HOSPITAL_COMMUNITY)
Admission: RE | Admit: 2015-09-05 | Discharge: 2015-09-05 | Disposition: A | Discharge status: Home / Self Care | Payer: Medicare Other | Source: Ambulatory Visit | Attending: Cardiology | Admitting: Cardiology

## 2015-09-05 ENCOUNTER — Other Ambulatory Visit: Payer: Self-pay | Admitting: *Deleted

## 2015-09-05 ENCOUNTER — Other Ambulatory Visit: Payer: Self-pay | Admitting: Cardiology

## 2015-09-05 DIAGNOSIS — R609 Edema, unspecified: Secondary | ICD-10-CM

## 2015-09-05 DIAGNOSIS — M7989 Other specified soft tissue disorders: Secondary | ICD-10-CM | POA: Diagnosis not present

## 2015-09-05 NOTE — Progress Notes (Signed)
Preliminary results by tech - Right upper ext. Venous duplex completed. Negative for deep and superficial vein thrombosis in the right upper extremity. Oda Cogan, BS, RDMS, RVT

## 2015-09-06 LAB — D-DIMER, QUANTITATIVE: D-Dimer, Quant: 0.55 ug/mL-FEU — ABNORMAL HIGH (ref 0.00–0.48)

## 2015-09-08 ENCOUNTER — Ambulatory Visit (HOSPITAL_BASED_OUTPATIENT_CLINIC_OR_DEPARTMENT_OTHER)
Admission: RE | Admit: 2015-09-08 | Discharge: 2015-09-08 | Disposition: A | Discharge status: Home / Self Care | Payer: Medicare Other | Source: Ambulatory Visit | Attending: Family Medicine | Admitting: Family Medicine

## 2015-09-08 ENCOUNTER — Telehealth: Payer: Self-pay | Admitting: Family Medicine

## 2015-09-08 ENCOUNTER — Ambulatory Visit (INDEPENDENT_AMBULATORY_CARE_PROVIDER_SITE_OTHER): Payer: Medicare Other | Admitting: Family Medicine

## 2015-09-08 VITALS — BP 108/68 | HR 72 | Temp 97.8°F | Resp 16 | Ht 70.0 in | Wt 237.2 lb

## 2015-09-08 DIAGNOSIS — R030 Elevated blood-pressure reading, without diagnosis of hypertension: Secondary | ICD-10-CM

## 2015-09-08 DIAGNOSIS — K219 Gastro-esophageal reflux disease without esophagitis: Secondary | ICD-10-CM | POA: Diagnosis not present

## 2015-09-08 DIAGNOSIS — L989 Disorder of the skin and subcutaneous tissue, unspecified: Secondary | ICD-10-CM | POA: Diagnosis not present

## 2015-09-08 DIAGNOSIS — I517 Cardiomegaly: Secondary | ICD-10-CM | POA: Insufficient documentation

## 2015-09-08 DIAGNOSIS — R918 Other nonspecific abnormal finding of lung field: Secondary | ICD-10-CM | POA: Diagnosis not present

## 2015-09-08 DIAGNOSIS — I890 Lymphedema, not elsewhere classified: Secondary | ICD-10-CM

## 2015-09-08 DIAGNOSIS — Z23 Encounter for immunization: Secondary | ICD-10-CM | POA: Diagnosis not present

## 2015-09-08 DIAGNOSIS — C4359 Malignant melanoma of other part of trunk: Secondary | ICD-10-CM

## 2015-09-08 DIAGNOSIS — M7989 Other specified soft tissue disorders: Secondary | ICD-10-CM | POA: Insufficient documentation

## 2015-09-08 DIAGNOSIS — G4733 Obstructive sleep apnea (adult) (pediatric): Secondary | ICD-10-CM

## 2015-09-08 DIAGNOSIS — Z9989 Dependence on other enabling machines and devices: Secondary | ICD-10-CM

## 2015-09-08 NOTE — Telephone Encounter (Signed)
Patient Name: JORMA TASSINARI DOB: [birth year] Initial Comment Caller states her husbands right arm is swelled and it's oozing out fluid. Nurse Assessment Nurse: Ronnald Ramp, RN, Miranda Date/Time (Eastern Time): 09/08/2015 8:37:26 AM Confirm and document reason for call. If symptomatic, describe symptoms. ---Caller states her husband's right arm has been swollen for > 1 year, but has gotten worse the last couple of weeks. Had a doppler US and D-dimmer on Friday at Cardiologist, both were negative. Has the patient traveled out of the country within the last 30 days? ---Not Applicable Does the patient require triage? ---Yes Related visit to physician within the last 2 weeks? ---No Does the PT have any chronic conditions? (i.e. diabetes, asthma, etc.) ---Yes List chronic conditions. ---Post herpatic neuralgia Guidelines Guideline Title Affirmed Question Affirmed Notes Skin Lump or Localized Swelling [1] Small swelling or lump AND [2] unexplained AND [3] present > 1 week Final Disposition User See PCP When Office is Open (within 3 days) Ronnald Ramp, RN, [employer] states pt already has an appt today at 3pm with Dr. Charlett Blake Disagree/Comply: Comply

## 2015-09-08 NOTE — Telephone Encounter (Signed)
Noted  

## 2015-09-08 NOTE — Patient Instructions (Signed)
Lymphedema Lymphedema is a swelling caused by the abnormal collection of lymph under the skin. The lymph is fluid from the tissues in your body that travels in the lymphatic system. This system is part of the immune system that includes lymph nodes and vessels. The lymph vessels collect and carry the excess fluid, fats, proteins, and wastes from the tissues of the body to the bloodstream. This system also works to clean and remove bacteria and waste products from the body.  Lymphedema occurs when the lymphatic system is blocked. When the lymph vessels or lymph nodes are blocked or damaged, lymph does not drain properly. This causes abnormal build up of lymph. This leads to swelling in the arms or legs. Lymphedema cannot be cured by medicines. But the swelling can be reduced by physical methods. CAUSES  There are two types of lymphedema. Primary lymphedema is caused by the absence or abnormality of the lymph vessel at birth. It is also known as inherited lymphedema, which occurs rarely. Secondary or acquired lymphedema occurs when the lymph vessel is damaged or blocked. The causes of lymph vessel blockage are:   Skin infection like cellulites.  Infection by parasites (filariasis).  Injury.  Cancer.  Radiation therapy.  Formation of scar tissue.  Surgery. SYMPTOMS  The symptoms of lymphedema are:  Abnormal swelling of the arm or leg.  Heavy or tight feeling in your arm or leg.  Tight-fitting shoes or rings.  Redness of skin over the affected area.  Limited movement of the affected limb.  Some patients complain about sensitivity to touch and discomfort in the limb(s) affected. You may not have these symptoms immediately following injury. They usually appear within a few days or even years after injury. Inform your caregiver, if you have any of these symptoms. Early treatment can avoid further problems.  DIAGNOSIS  First, your caregiver will inquire about any surgery you have had or  medicines you are taking. He will then examine you. Your caregiver may order special imaging tests, such as:  Lymphoscintigraphy (a test in which a low dose of radioactive substance is injected to trace the flow of lymph through the lymph vessels).  MRI (imaging tests using magnetic fields).  Computed tomography (test using special cross-sectional X-rays).  Duplex ultrasound (test using high-frequency sound waves to show the vessels and the blood flow on a screen).  Lymphangiography (special X-ray taken after injecting a contrast dye into the lymph vessel). It is now rarely done. TREATMENT  Lymphedema can be treated in different ways. Your caregiver will decide the type of treatment depending on the cause. Treatment may include:  Exercise: Special exercises will help fluid move out easily from the affected part. This should be done as per your caregiver's advice.  Manual lymph drainage: Gentle massage of the affected limb makes the fluid to move out more freely.  Compression: Compression stockings or external pump apply pressure over the affected limb. This helps the fluid to move out from the arm or leg. Bandaging can also help to move the fluid out from the affected part. Your caregiver will decide the method that suits you the best.  Medicines: Your caregiver may prescribe antibiotics, if you have infection.  Surgery: Your caregiver may advise surgery for severe lymphedema. It is reserved for special cases when the patient has difficulty moving. Your surgeon may remove excess tissue from the arm or leg. This will help to ease your movement. Physical therapy may have to be continued after surgery. HOME CARE INSTRUCTIONS    The area is very fragile and is predisposed to injury and infection.  Eat a healthy diet.  Exercise regularly as per advice.  Keep the affected area clean and dry.  Use gloves while cooking or gardening.  Protect your skin from cuts.  Use electric razor to  shave the affected area.  Keep affected limb elevated.  Do not wear tight clothes, shoes, or jewelry as it may cause the tissue to be strangled.  Do not use heat pads over the affected area.  Do not sit with cross legs.  Do not walk barefoot.  Do not carry weight on the affected arm.  Avoid having blood pressure checked on the affected limb. SEEK MEDICAL CARE IF:  You continue to have swelling in your limb. SEEK IMMEDIATE MEDICAL CARE IF:   You have high fever.  You have skin rash.  You have chills or sweats.  You have pain or redness.  You have a cut that does not heal. MAKE SURE YOU:   Understand these instructions.  Will watch your condition.  Will get help right away if you are not doing well or get worse. Document Released: 10/10/2007 Document Revised: 11/29/2012 Document Reviewed: 09/15/2009 ExitCare Patient Information 2015 [employer]. This information is not intended to replace advice given to you by your health care provider. Make sure you discuss any questions you have with your health care provider.  

## 2015-09-08 NOTE — Progress Notes (Signed)
Pre visit review using our clinic review tool, if applicable. No additional management support is needed unless otherwise documented below in the visit note. 

## 2015-09-08 NOTE — Telephone Encounter (Signed)
Seen today. 

## 2015-09-09 LAB — COMPREHENSIVE METABOLIC PANEL
ALBUMIN: 3.9 g/dL (ref 3.5–5.2)
ALK PHOS: 51 U/L (ref 39–117)
ALT: 40 U/L (ref 0–53)
AST: 28 U/L (ref 0–37)
BUN: 28 mg/dL — AB (ref 6–23)
CO2: 28 mEq/L (ref 19–32)
CREATININE: 1.04 mg/dL (ref 0.40–1.50)
Calcium: 9 mg/dL (ref 8.4–10.5)
Chloride: 108 mEq/L (ref 96–112)
GFR: 74.47 mL/min (ref >60.00)
GLUCOSE: 89 mg/dL (ref 70–99)
POTASSIUM: 4.6 meq/L (ref 3.5–5.1)
SODIUM: 142 meq/L (ref 135–145)
TOTAL PROTEIN: 6.6 g/dL (ref 6.0–8.3)
Total Bilirubin: 0.6 mg/dL (ref 0.2–1.2)

## 2015-09-09 LAB — CBC
HEMATOCRIT: 41.7 % (ref 39.0–52.0)
Hemoglobin: 13.6 g/dL (ref 13.0–17.0)
MCHC: 32.5 g/dL (ref 30.0–36.0)
MCV: 87.9 fl (ref 78.0–100.0)
Platelets: 201 10*3/uL (ref 150.0–400.0)
RBC: 4.74 Mil/uL (ref 4.22–5.81)
RDW: 17.3 % — AB (ref 11.5–15.5)
WBC: 7.9 10*3/uL (ref 4.0–10.5)

## 2015-09-10 ENCOUNTER — Telehealth: Payer: Self-pay

## 2015-09-10 ENCOUNTER — Ambulatory Visit (HOSPITAL_BASED_OUTPATIENT_CLINIC_OR_DEPARTMENT_OTHER)
Admission: RE | Admit: 2015-09-10 | Discharge: 2015-09-10 | Disposition: A | Discharge status: Home / Self Care | Payer: Medicare Other | Source: Ambulatory Visit | Attending: Family Medicine | Admitting: Family Medicine

## 2015-09-10 ENCOUNTER — Encounter (HOSPITAL_BASED_OUTPATIENT_CLINIC_OR_DEPARTMENT_OTHER): Payer: Self-pay

## 2015-09-10 DIAGNOSIS — L989 Disorder of the skin and subcutaneous tissue, unspecified: Secondary | ICD-10-CM

## 2015-09-10 DIAGNOSIS — M7989 Other specified soft tissue disorders: Secondary | ICD-10-CM | POA: Diagnosis not present

## 2015-09-10 DIAGNOSIS — I890 Lymphedema, not elsewhere classified: Secondary | ICD-10-CM

## 2015-09-10 MED ORDER — CEPHALEXIN 500 MG PO CAPS: 500.0000 mg | ORAL_CAPSULE | Freq: Three times a day (TID) | ORAL | Status: DC

## 2015-09-10 MED ORDER — IOHEXOL 300 MG/ML  SOLN
100.0000 mL | Freq: Once | INTRAMUSCULAR | Status: AC | PRN
  Administered 2015-09-10: 100 mL via INTRAVENOUS

## 2015-09-10 NOTE — Telephone Encounter (Signed)
-----   Message from Mosie Lukes, MD sent at 09/10/2015  3:23 PM EDT ----- CT of arm just shows swelling of tissue, no concerning lesions otherwise. Possible cellulitis. History is not consistent with this presentation but lets treat with antibiotics to see if he improves. Start Keflex 500 mg po tid x 10 days and take a probiotic daily. Disp #30

## 2015-09-10 NOTE — Telephone Encounter (Signed)
Wife notified of results and rx sent in. No questions or concerns at this time.

## 2015-09-21 ENCOUNTER — Encounter: Payer: Self-pay | Admitting: Family Medicine

## 2015-09-21 DIAGNOSIS — Z0000 Encounter for general adult medical examination without abnormal findings: Secondary | ICD-10-CM | POA: Insufficient documentation

## 2015-09-21 DIAGNOSIS — L989 Disorder of the skin and subcutaneous tissue, unspecified: Secondary | ICD-10-CM | POA: Insufficient documentation

## 2015-09-21 DIAGNOSIS — I890 Lymphedema, not elsewhere classified: Secondary | ICD-10-CM | POA: Insufficient documentation

## 2015-09-21 NOTE — Assessment & Plan Note (Signed)
Skin changes on anterior chest wall today, patient agrees to contact his dermatology for further evaluation

## 2015-09-21 NOTE — Assessment & Plan Note (Signed)
Uses CPAP nightly, feels well

## 2015-09-21 NOTE — Assessment & Plan Note (Signed)
Avoid offending foods, start probiotics. Do not eat large meals in late evening and consider raising head of bed.  

## 2015-09-21 NOTE — Progress Notes (Signed)
Subjective:    Patient ID: [name] [name], male    DOB: 1941/09/28, 74 y.o.   MRN: [id number]  Chief Complaint  Patient presents with  . Edema    right arm, has been slightly swollen for a year, last 2 weeks has increased in size, lypomas removed from that side one year ago, no pain/numbness, pitting     HPI Patient is in today for evaluation of swelling in his right arm. It is been worsening slowly over several months. He denies fevers chills, trauma. He denies any significant pain other than the tension in his arm. No heat, but bite or injury. Otherwise feels well. No malaise, myalgias anorexia etc. Denies CP/palp/SOB/HA/congestion/fevers/GI or GU c/o. Taking meds as prescribed Past Medical History  Diagnosis Date  . GERD (gastroesophageal reflux disease)     controlled w/ diet and behavioral changes  . BPH (benign prostatic hypertrophy)   . Elevated PSA   . Shingles 07/09/2013  . Encounter for Medicare annual wellness exam 09/22/2013    Sees Dr Wilhemina Bonito of Derm Sees Dr Silvano Rusk of Gastroenterology Sees Dr Kathie Rhodes of [facility] Sees Dr Susa Day of Optometry  . Snoring disorder 07/30/2011  . Sleep apnea 07/30/2011    cpap- 13   . Bradycardia 12/30/2014    HR routinely in mid 40s-50s  . New onset a-fib 03/10/2015  . Hypertriglyceridemia 03/16/2015  . White coat hypertension   . Melanoma of back     Excised Dr Wilhemina Bonito  . Coronary artery disease (CAD) excluded April 2016    False-positive nuclear stress test suggesting inferior ischemia  . Otitis, externa, infective 08/10/2015    Past Surgical History  Procedure Laterality Date  . Mass excision N/A 05/16/2014    Procedure: EXCISION POSTERIOR NECK MASS, RIGHT CHEST WALL MASS AND RIGHT AXILLARY MASS AXILLARY NODE DISSECTION;  Surgeon: Adin Hector, MD;  Location: [facility] ORS;  Service: General;  Laterality: N/A;  . Transthoracic echocardiogram  03/20/2015    Normal LV size and function. EF 60-65%. G1 DD. Trivial AI.  Borderline aortic root dilation (41 mm), Mild LA dilation.  Marland Kitchen Nm myoview ltd  03/19/2015    FALSE POSITIVE:  INTERMEDIATE RISK. Small sized, moderate intensity inferior ischemic perfusion defect  . Left heart catheterization with coronary angiogram N/A 04/10/2015    Procedure: LEFT HEART CATHETERIZATION WITH  CORONARY ANGIOGRAM;  Surgeon: Leonie Man, MD;  Location: [facility] CATH LAB;  Service: Cardiovascular;  Angiographicallly NORMAL CORONARY ARTERIES    Family History  Problem Relation Age of Onset  . Cancer Mother     MM, leukemia  . Emphysema Father   . Cancer Sister     brain  . Alcohol abuse Other   . COPD Brother   . Heart disease Paternal Grandmother   . COPD Paternal Grandfather   . Diabetes Sister   . Obesity Sister   . Down syndrome Brother   . Colon cancer Neg Hx     Social History   Social History  . Marital Status: Married    Spouse Name: [name]  . Number of Children: 2  . Years of Education: N/A   Occupational History  . retired     Administrator   Social History Main Topics  . Smoking status: Former Smoker    Quit date: 02/11/1989  . Smokeless tobacco: Never Used  . Alcohol Use: 4.2 oz/week    7 Glasses of wine per week     Comment: 1  per day -- brandy or wine  . Drug Use: No  . Sexual Activity: Not on file     Comment: lives with wife, retired from [employer] driving, no dietary restrictions   Other Topics Concern  . Not on file   Social History Narrative   He is a married father of 2, and grandfather of 20. He has been married to his wife [name] for 51 years. He previously lived in [place] but has moved with his wife to [place], [place] to be close to his daughter [name] and her family. [name] is our Nurse, adult.   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 He quit smoking in 1990. He has 6-7 glasses of brandy or wine a week.    He exercises regularly with low impact aerobics 2 days a week and daily  walks of 30-45 minutes. His exercise sessions or 60 minutes. He will do some type of exercise at least 7 days a week and is very active doing yard work and wood work.    Outpatient Prescriptions Prior to Visit  Medication Sig Dispense Refill  . aspirin 81 MG chewable tablet Chew 81 mg by mouth every Monday, Wednesday, and Friday.     . Cholecalciferol (VITAMIN D) 2000 UNITS CAPS Take 1 capsule by mouth daily.    . diazepam (VALIUM) 5 MG tablet Take 0.5-2 tablets (2.5-10 mg total) by mouth every evening. As needed 30 tablet 1  . gabapentin (NEURONTIN) 300 MG capsule Take 3 capsules (900 mg total) by mouth 3 (three) times daily. 270 capsule 2  . KRILL OIL PO Take 1 capsule by mouth daily.    . Multiple Vitamin (MULTIVITAMIN WITH MINERALS) TABS tablet Take 1 tablet by mouth daily.    Marland Kitchen neomycin-polymyxin-hydrocortisone (CORTISPORIN) otic solution Place 4 drops into the right ear 3 (three) times daily. 10 mL 0   No facility-administered medications prior to visit.    No Known Allergies  Review of Systems  Constitutional: Negative for fever and malaise/fatigue.  HENT: Negative for congestion.   Eyes: Negative for discharge.  Respiratory: Negative for shortness of breath.   Cardiovascular: Negative for chest pain, palpitations and leg swelling.  Gastrointestinal: Negative for nausea and abdominal pain.  Genitourinary: Negative for dysuria.  Musculoskeletal: Negative for falls.  Skin: Negative for rash.  Neurological: Negative for loss of consciousness and headaches.  Endo/Heme/Allergies: Negative for environmental allergies.  Psychiatric/Behavioral: Negative for depression. The patient is not nervous/anxious.        Objective:    Physical Exam  Constitutional: He is oriented to person, place, and time. He appears well-developed and well-nourished. No distress.  HENT:  Head: Normocephalic and atraumatic.  Nose: Nose normal.  Eyes: Right eye exhibits no discharge. Left eye exhibits  no discharge.  Neck: Normal range of motion. Neck supple.  Cardiovascular: Normal rate and regular rhythm.   No murmur heard. Pulmonary/Chest: Effort normal and breath sounds normal.  Abdominal: Soft. Bowel sounds are normal. There is no tenderness.  Musculoskeletal: He exhibits edema.  Right arm swollen diffusely, no tenderness noted. some erythema noted across entire arm  Neurological: He is alert and oriented to person, place, and time.  Skin: Skin is warm and dry.  Psychiatric: He has a normal mood and affect.  Nursing note and vitals reviewed.   BP 108/68 mmHg  Pulse 72  Temp(Src) 97.8 F (36.6 C) (Oral)  Resp 16  Ht 5' 10" (1.778 m)  Wt  237 lb 3.2 oz (107.593 kg)  BMI 34.03 kg/m2  SpO2 94% Wt Readings from Last 3 Encounters:  09/08/15 237 lb 3.2 oz (107.593 kg)  07/29/15 235 lb 6 oz (106.765 kg)  05/29/15 234 lb 8 oz (106.369 kg)     Lab Results  Component Value Date   WBC 7.9 09/08/2015   HGB 13.6 09/08/2015   HCT 41.7 09/08/2015   PLT 201.0 09/08/2015   GLUCOSE 89 09/08/2015   CHOL 133 07/22/2015   TRIG 67.0 07/22/2015   HDL 44.70 07/22/2015   LDLCALC 75 07/22/2015   ALT 40 09/08/2015   AST 28 09/08/2015   NA 142 09/08/2015   K 4.6 09/08/2015   CL 108 09/08/2015   CREATININE 1.04 09/08/2015   BUN 28* 09/08/2015   CO2 28 09/08/2015   TSH 1.99 07/22/2015   PSA 4.94* 01/15/2010   INR 1.22 04/04/2015    Lab Results  Component Value Date   TSH 1.99 07/22/2015   Lab Results  Component Value Date   WBC 7.9 09/08/2015   HGB 13.6 09/08/2015   HCT 41.7 09/08/2015   MCV 87.9 09/08/2015   PLT 201.0 09/08/2015   Lab Results  Component Value Date   NA 142 09/08/2015   K 4.6 09/08/2015   CO2 28 09/08/2015   GLUCOSE 89 09/08/2015   BUN 28* 09/08/2015   CREATININE 1.04 09/08/2015   BILITOT 0.6 09/08/2015   ALKPHOS 51 09/08/2015   AST 28 09/08/2015   ALT 40 09/08/2015   PROT 6.6 09/08/2015   ALBUMIN 3.9 09/08/2015   CALCIUM 9.0 09/08/2015   GFR  74.47 09/08/2015   Lab Results  Component Value Date   CHOL 133 07/22/2015   Lab Results  Component Value Date   HDL 44.70 07/22/2015   Lab Results  Component Value Date   LDLCALC 75 07/22/2015   Lab Results  Component Value Date   TRIG 67.0 07/22/2015   Lab Results  Component Value Date   CHOLHDL 3 07/22/2015   No results found for: HGBA1C     Assessment & Plan:   Problem List Items Addressed This Visit    RESOLVED: Skin lesion of chest wall - Primary   Relevant Orders   Ambulatory referral to Physical Therapy   DG Chest 2 View (Completed)   CBC (Completed)   Comp Met (CMET) (Completed)   CT Extrem Up Entire Arm R W/CM (Completed)   OSA on CPAP (Chronic)    Uses CPAP nightly, feels well      Melanoma of back    Skin changes on anterior chest wall today, patient agrees to contact his dermatology for further evaluation      Lymphedema    Right arm, secondary to axillary dissection, confirmed iwht imaging. Referred to PT For therapy of lymphedema      Relevant Orders   Ambulatory referral to Physical Therapy   DG Chest 2 View (Completed)   CBC (Completed)   Comp Met (CMET) (Completed)   CT Extrem Up Entire Arm R W/CM (Completed)   GERD    Avoid offending foods, start probiotics. Do not eat large meals in late evening and consider raising head of bed.       Encounter for immunization    Given flu shot today      ELEVATED BP READING WITHOUT DX HYPERTENSION    Well controlled. Encouraged heart healthy diet such as the DASH diet and exercise as tolerated.  I have discontinued Mr. Johal's neomycin-polymyxin-hydrocortisone. I am also having him maintain his aspirin, multivitamin with minerals, KRILL OIL PO, Vitamin D, gabapentin, and diazepam.  No orders of the defined types were placed in this encounter.     [name], MD

## 2015-09-21 NOTE — Assessment & Plan Note (Signed)
Given flu shot  today 

## 2015-09-21 NOTE — Assessment & Plan Note (Signed)
Well controlled. Encouraged heart healthy diet such as the DASH diet and exercise as tolerated.  

## 2015-09-21 NOTE — Assessment & Plan Note (Addendum)
Right arm, secondary to axillary dissection, confirmed with imaging. Referred to PT For therapy of lymphedema. Mild erythema noted so will try a course of antibiotics to see if this is helpful. Start a probiotic

## 2015-09-24 ENCOUNTER — Ambulatory Visit: Payer: Medicare Other | Attending: Family Medicine | Admitting: Physical Therapy

## 2015-09-24 DIAGNOSIS — I890 Lymphedema, not elsewhere classified: Secondary | ICD-10-CM

## 2015-09-24 NOTE — Therapy (Signed)
[place], [place], [address] Phone: [phone]   Fax:  [phone]  Physical Therapy Evaluation  Patient Details  Name: [name] [name] MRN: [id number] Date of Birth: July 09, 1941 Referring Provider:  Mosie Lukes, MD  Encounter Date: 09/24/2015      PT End of Session - 09/24/15 2033    Visit Number 1   Number of Visits 25   Date for PT Re-Evaluation 11/26/15   PT Start Time 1518   PT Stop Time 1608   PT Time Calculation (min) 50 min   Activity Tolerance Patient tolerated treatment well   Behavior During Therapy [facility] for tasks assessed/performed      Past Medical History  Diagnosis Date  . GERD (gastroesophageal reflux disease)     controlled w/ diet and behavioral changes  . BPH (benign prostatic hypertrophy)   . Elevated PSA   . Shingles 07/09/2013  . Encounter for Medicare annual wellness exam 09/22/2013    Sees Dr Wilhemina Bonito of Derm Sees Dr Silvano Rusk of Gastroenterology Sees Dr Kathie Rhodes of [facility] Sees Dr Susa Day of Optometry  . Snoring disorder 07/30/2011  . Sleep apnea 07/30/2011    cpap- 13   . Bradycardia 12/30/2014    HR routinely in mid 40s-50s  . New onset a-fib 03/10/2015  . Hypertriglyceridemia 03/16/2015  . White coat hypertension   . Melanoma of back     Excised Dr Wilhemina Bonito  . Coronary artery disease (CAD) excluded April 2016    False-positive nuclear stress test suggesting inferior ischemia  . Otitis, externa, infective 08/10/2015    Past Surgical History  Procedure Laterality Date  . Mass excision N/A 05/16/2014    Procedure: EXCISION POSTERIOR NECK MASS, RIGHT CHEST WALL MASS AND RIGHT AXILLARY MASS AXILLARY NODE DISSECTION;  Surgeon: Adin Hector, MD;  Location: [facility];  Service: General;  Laterality: N/A;  . Transthoracic echocardiogram  03/20/2015    Normal LV size and function. EF 60-65%. G1 DD. Trivial AI. Borderline aortic root dilation (41  mm), Mild LA dilation.  Marland Kitchen Nm myoview ltd  03/19/2015    FALSE POSITIVE:  INTERMEDIATE RISK. Small sized, moderate intensity inferior ischemic perfusion defect  . Left heart catheterization with coronary angiogram N/A 04/10/2015    Procedure: LEFT HEART CATHETERIZATION WITH  CORONARY ANGIOGRAM;  Surgeon: Leonie Man, MD;  Location: [name] CATH LAB;  Service: Cardiovascular;  Angiographicallly NORMAL CORONARY ARTERIES    There were no vitals filed for this visit.  Visit Diagnosis:  Lymphedema of arm - Plan: PT plan of care cert/re-cert      Subjective Assessment - 09/24/15 1519    Subjective Swelling in right arm for almost a year (noticed by wife that long, patient says 3-4 months of it being bad).   Pertinent History Melanoma on mid back with no nodes removed; they told him they had good margins; no other treatment.  That was about two years ago.  Left shoulder joint needs to be replaced if it bothers him bad enough.  Recently diagnosed with  atrial fibrillation, but says he stopped drinking caffeine and that resolved it.   Had a lipoma removed a year ago or so, and that was in the area of the right axilla.  Patient Stated Goals get the swelling down   Currently in Pain? Yes   Pain Location Chest  to axilla and scapula   Pain Orientation Right   Pain Descriptors / Indicators Other (Comment)  "like a big knot"   Aggravating Factors  comes and goes   Pain Relieving Factors putting pressure on it, lying down            [facility] PT Assessment - 09/24/15 0001    Assessment   Medical Diagnosis lymphedema right UE   Hand Dominance Right   Precautions   Precautions Other (comment)   Precaution Comments cancer precautions   Restrictions   Weight Bearing Restrictions No   Balance Screen   Has the patient fallen in the past 6 months No   Has the patient had a decrease in activity level because of a fear of falling?  No   Is the  patient reluctant to leave their home because of a fear of falling?  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, radio as a hobby   Cognition   Overall Cognitive Status Within Functional Limits for tasks assessed   Posture/Postural Control   Posture/Postural Control Postural limitations   Postural Limitations Forward head   ROM / Strength   AROM / PROM / Strength AROM   AROM   AROM Assessment Site Shoulder   Right/Left Shoulder Right;Left   Right Shoulder Flexion 149 Degrees   Right Shoulder ABduction 174 Degrees   Left Shoulder Flexion 96 Degrees   Left Shoulder ABduction 70 Degrees           LYMPHEDEMA/ONCOLOGY QUESTIONNAIRE - 09/24/15 1532    Type   Cancer Type melanoma   Surgeries   Other Surgery Date 10/27/13  approx.   Treatment   Past Chemotherapy Treatment No   Past Radiation Treatment No   What other symptoms do you have   Are you having Pain No   Are you having pitting edema Yes   Stemmer Sign Yes   Lymphedema Assessments   Lymphedema Assessments Upper extremities   Right Upper Extremity Lymphedema   15 cm Proximal to Olecranon Process 35.7 cm   10 cm Proximal to Olecranon Process 33.7 cm   Olecranon Process 35.7 cm   15 cm Proximal to Ulnar Styloid Process 35.7 cm   10 cm Proximal to Ulnar Styloid Process 32.8 cm   Just Proximal to Ulnar Styloid Process 24.8 cm   Across Hand at [employer] 26.3 cm   At [place] of 2nd Digit 8.5 cm   Left Upper Extremity Lymphedema   15 cm Proximal to Olecranon Process 33.3 cm   10 cm Proximal to Olecranon Process 31.1 cm   Olecranon Process 28.5 cm   15 cm Proximal to Ulnar Styloid Process 27.9 cm   10 cm Proximal to Ulnar Styloid Process 25.7 cm   Just Proximal to Ulnar Styloid Process 20 cm   Across Hand at  [employer] 22.5 cm   At [place] of 2nd Digit 8 cm                        PT Education - 09/24/15 2035    Education provided Yes   Education Details gave info pamphlet  on lymphedema   Person(s) Educated Patient   Methods Handout   Comprehension Need further instruction           Short Term Clinic Goals - 09/24/15 2042    CC Short Term Goal  #1   Title Pt. and or his wife will be knowledgeable about how to perform manual lymph drainage.   Time 2   Period Weeks  10/10/15   Status New   CC Short Term Goal  #2   Title Right arm circumference at 15 cm. proximal to ulnar styloid reduced to 33 cm.   Baseline 35.7 cm. compared to 27.9 on left at time of eval   Time 3   Period Weeks  10/31/15   Status New   CC Short Term Goal  #3   Title Right hand circumference reduced to 15 cm.   Baseline 26.3 cm. compared to 22.5 on left at time of eval   Time 3   Period Weeks  10/31/15   Status New             Long Term Clinic Goals - 09/24/15 2049    CC Long Term Goal  #1   Title Right arm circumference at 15 cm. proximal to ulnar styloid reduced to 31 cm.   Baseline 35.7 cm. compared to 27.9 on left at time of eval   Time 6   Period Weeks  11/24/15   Status New   CC Long Term Goal  #2   Title Right hand circumference reduced to 24 cm.   Baseline 26.3 cm. compared to 22.5 at time of eval   Time 6   Period Weeks  11/24/15   Status New   CC Long Term Goal  #3   Title Pt. will be knowledgeable about how and where to obtain daytime compression sleeve and glove, and nighttime garment if he wants one   Time 6   Period Weeks  11/24/15   Status New   CC Long Term Goal  #4   Title Pt. will be knowledgeable about lymphedema risk reduction practices   Time 6   Period Weeks  11/24/15   Status New            Plan - 09/24/15 2035    Clinical Impression Statement This is a pleasant gentleman with significant stage 2 right UE lymphedema which may have  started about a year ago but has worsened particularly in the last two months.  When treatment options were discussed, he readily chose complete decongestive therapy including bandaging to try to minimize the swelling.     Pt will benefit from skilled therapeutic intervention in order to improve on the following deficits Increased edema;Decreased knowledge of precautions;Decreased knowledge of use of DME   Rehab Potential Excellent   PT Frequency 3x / week   PT Duration 6 weeks  to 8 weeks if needed   PT Treatment/Interventions Manual lymph drainage;Compression bandaging;Patient/family education;DME Instruction   PT Next Visit Plan First visit is to be for manual lymph drainage instruction so that patient and his wife can do this until his regular three time a week schedule for complete decongestive therapy begins (because we could not start this with him for a couple of weeks).  When he can be seen three times a week, begin complete decongestive therapy for right UE along with discussion of compression garment options for day and possibly nighttime.  PT Home Exercise Plan After first visit, do manual lymph drainage daily at home.   Consulted and Agree with Plan of Care Patient;Family member/caregiver          G-Codes - October 02, 2015 04/04/2052    Functional Assessment Tool Used clinical judgement   Functional Limitation Self care   Self Care Current Status [phone]) At least 80 percent but less than 100 percent impaired, limited or restricted   Self Care Goal Status (T6606) At least 1 percent but less than 20 percent impaired, limited or restricted       Problem List Patient Active Problem List   Diagnosis Date Noted  . Lymphedema 09/21/2015  . Encounter for immunization 09/21/2015  . Otitis, externa, infective 08/10/2015  . Abnormal exercise myocardial perfusion study 04/02/2015  . Medicare annual wellness visit, subsequent 03/16/2015   . Paroxysmal atrial fibrillation - auto rate control 03/16/2015  . Hypertriglyceridemia 03/16/2015  . Bradycardia 12/30/2014  . Melanoma of back   . Mass of right chest wall 05/16/2014  . Mass of chest wall, right subfacial, 12cm s/p removal w rib resection 05/16/2014 03/08/2014  . Mass of posterior neck, SQ 4cm 03/08/2014  . Chest wall mass - right SQ anterior 5cm 08/21/2013  . Post herpetic neuralgia 07/09/2013  . Blepharitis of right lower eyelid 08/03/2012  . OSA on CPAP 07/30/2011  . ELEVATED PROSTATE SPECIFIC ANTIGEN 01/13/2010  . LIPOMA, SKIN 08/29/2008  . GERD 08/29/2008  . BENIGN PROSTATIC HYPERTROPHY 08/29/2008  . SHOULDER PAIN, LEFT 08/29/2008  . ELEVATED BP READING WITHOUT DX HYPERTENSION 08/29/2008    [name] [name],[name] [name] 02-Oct-2015, 8:57 PM  [name] [name], [place], [address] Phone: [phone]   Fax:  [place], PT 10/02/2015 8:57 PM

## 2015-09-25 ENCOUNTER — Ambulatory Visit: Payer: Medicare Other | Admitting: Physical Therapy

## 2015-09-25 DIAGNOSIS — I890 Lymphedema, not elsewhere classified: Secondary | ICD-10-CM

## 2015-09-25 NOTE — Patient Instructions (Signed)
[email or web address] Courses Online Strength After Breast Cancer Look at the right of the page for Lymphedema Education Session   Neck Side-Bending   Begin with chin level, head centered over spine. Slowly lower one ear toward shoulder keeping shoulder down. Hold __3__ seconds. Slowly return to starting position. Repeat to other side.  Repeat __5-10__ times to each side. Do 2-3 times a day. Also stretch by looking over right shoulder, then left.  Scapular Retraction (Standing)   With arms at sides, pinch shoulder blades together.  Hold 3 seconds. Repeat __5-10__ times per set.  Do __2-3__ sessions per day.   Active ROM Flexion    Raise arm to point to ceiling, keeping elbow straight. Hold __3__ seconds. Repeat _5-10___ times. Do _2-3___ sessions per day. Activity: Use this motion to reach for items on overhead shelves.     Flexion (Active)   Palm up, rest elbow on other hand. Bend as far as possible. Hold __3__ seconds. Repeat __5-10__ times. Do _2-3___ sessions per day. Can use other hand to bend elbow further if needed where bandage may limit end motion.  Copyright  VHI. All rights reserved.    Extension (Active With Finger Extension)   Llift hand with fingers straight, then bend wrist down.  Hold _3___ seconds each way. Repeat __5-10__ times. Do _2-3___ sessions per day.    AROM: Forearm Pronation / Supination   With right arm in handshake position, slowly rotate palm down. Relax. Then rotate palm up. Repeat __5-10__ times per set. Do __2-3__ sessions per day.  Copyright  VHI. All rights reserved.    AROM: Finger Flexion / Extension   Actively bend fingers of right hand. Start with knuckles furthest from palm, and slowly make a fist. Hold __3__ seconds. Relax. Then straighten fingers as far as possible. Repeat _5-10___ times per set.  Do __2-3__ sessions per day. Also spread fingers as far as able, then bring them back together.  Copyright  VHI. All  rights reserved.   If any pain/tingling symptoms occur, try all of these exercises first to promote circulation. If symptoms do not ease off, then remove bandages and bring all wrappings back to next appointment.

## 2015-09-25 NOTE — Therapy (Signed)
[place], [place], [address] Phone: [phone]   Fax:  [phone]  Physical Therapy Treatment  Patient Details  Name: [name] [name] MRN: [id number] Date of Birth: 1941/08/30 Referring Provider:  Mosie Lukes, MD  Encounter Date: 09/25/2015      PT End of Session - 09/25/15 1657    Visit Number 2   Number of Visits 25   Date for PT Re-Evaluation 11/26/15   PT Start Time 1600   PT Stop Time 1645   PT Time Calculation (min) 45 min   Activity Tolerance Patient tolerated treatment well   Behavior During Therapy [facility] for tasks assessed/performed      Past Medical History  Diagnosis Date  . GERD (gastroesophageal reflux disease)     controlled w/ diet and behavioral changes  . BPH (benign prostatic hypertrophy)   . Elevated PSA   . Shingles 07/09/2013  . Encounter for Medicare annual wellness exam 09/22/2013    Sees Dr Wilhemina Bonito of Derm Sees Dr Silvano Rusk of Gastroenterology Sees Dr Kathie Rhodes of [facility] Sees Dr Susa Day of Optometry  . Snoring disorder 07/30/2011  . Sleep apnea 07/30/2011    cpap- 13   . Bradycardia 12/30/2014    HR routinely in mid 40s-50s  . New onset a-fib 03/10/2015  . Hypertriglyceridemia 03/16/2015  . White coat hypertension   . Melanoma of back     Excised Dr Wilhemina Bonito  . Coronary artery disease (CAD) excluded April 2016    False-positive nuclear stress test suggesting inferior ischemia  . Otitis, externa, infective 08/10/2015    Past Surgical History  Procedure Laterality Date  . Mass excision N/A 05/16/2014    Procedure: EXCISION POSTERIOR NECK MASS, RIGHT CHEST WALL MASS AND RIGHT AXILLARY MASS AXILLARY NODE DISSECTION;  Surgeon: Adin Hector, MD;  Location: [facility];  Service: General;  Laterality: N/A;  . Transthoracic echocardiogram  03/20/2015    Normal LV size and function. EF 60-65%. G1 DD. Trivial AI. Borderline aortic root dilation (41 mm),  Mild LA dilation.  Marland Kitchen Nm myoview ltd  03/19/2015    FALSE POSITIVE:  INTERMEDIATE RISK. Small sized, moderate intensity inferior ischemic perfusion defect  . Left heart catheterization with coronary angiogram N/A 04/10/2015    Procedure: LEFT HEART CATHETERIZATION WITH  CORONARY ANGIOGRAM;  Surgeon: Leonie Man, MD;  Location: [facility] CATH LAB;  Service: Cardiovascular;  Angiographicallly NORMAL CORONARY ARTERIES    There were no vitals filed for this visit.  Visit Diagnosis:  Lymphedema of arm      Subjective Assessment - 09/25/15 1652    Subjective Pt and wife are hear to learn about manual lymph drainage    Pertinent History Melanoma on mid back with no nodes removed; they told him they had good margins; no other treatment.  That was about two years ago.  Left shoulder joint needs to be replaced if it bothers him bad enough.  Recently diagnosed with  atrial fibrillation, but says he stopped drinking caffeine and that resolved it.   Had a lipoma removed a year ago or so, and that was in the area of the right axilla. He has ALND at that time  Patient Stated Goals get the swelling down   Currently in Pain? No/denies               LYMPHEDEMA/ONCOLOGY QUESTIONNAIRE - 09/24/15 1532    Type   Cancer Type melanoma   Surgeries   Other Surgery Date 10/27/13  approx.   Treatment   Past Chemotherapy Treatment No   Past Radiation Treatment No   What other symptoms do you have   Are you having Pain No   Are you having pitting edema Yes   Stemmer Sign Yes   Lymphedema Assessments   Lymphedema Assessments Upper extremities   Right Upper Extremity Lymphedema   15 cm Proximal to Olecranon Process 35.7 cm   10 cm Proximal to Olecranon Process 33.7 cm   Olecranon Process 35.7 cm   15 cm Proximal to Ulnar Styloid Process 35.7 cm   10 cm Proximal to Ulnar Styloid Process 32.8 cm   Just Proximal to Ulnar Styloid Process 24.8 cm    Across Hand at [employer] 26.3 cm   At [place] of 2nd Digit 8.5 cm   Left Upper Extremity Lymphedema   15 cm Proximal to Olecranon Process 33.3 cm   10 cm Proximal to Olecranon Process 31.1 cm   Olecranon Process 28.5 cm   15 cm Proximal to Ulnar Styloid Process 27.9 cm   10 cm Proximal to Ulnar Styloid Process 25.7 cm   Just Proximal to Ulnar Styloid Process 20 cm   Across Hand at [employer] 22.5 cm   At [place] of 2nd Digit 8 cm                  [facility] Adult PT Treatment/Exercise - 09/25/15 0001    Self-Care   Self-Care Other Self-Care Comments   Other Self-Care Comments  instruction in basics of lymph system and manual lymph drainage. elevate arm and do remedial exercises as much as possible with deep breathing    Exercises   Other Exercises  UE lymphedema remedial exercise    Manual Therapy   Manual therapy comments short neck, superficial and deep abdominals., right inguinal nodes and axillo-inguinal anastamosis, left axillary nodes and anterior interaxillary anastamosis, right shoulder upper arm , medial elbow forearm and hand with return along pathways with insruction along the way                 PT Education - 09/25/15 1656    Education provided Yes   Education Details klose training eduction session, issued DVD for practice at home, brief lymphedema risk reduction precautions, how to do self manual lymph drainage, remedial exercise    Person(s) Educated Patient;Spouse   Methods Explanation;Demonstration;Handout   Comprehension Verbalized understanding;Need further instruction           Short Term Clinic Goals - 09/25/15 1701    CC Short Term Goal  #1   Title Pt. and or his wife will be knowledgeable about how to perform manual lymph drainage.   Status On-going             Long Term Clinic Goals - 09/24/15 2049    CC Long Term Goal  #1   Title Right arm circumference at 15 cm. proximal to ulnar styloid reduced to 31 cm.   Baseline 35.7  cm. compared to 27.9 on left at time of eval   Time 6   Period Weeks  11/24/15   Status New   CC Long Term Goal  #2  Title Right hand circumference reduced to 24 cm.   Baseline 26.3 cm. compared to 22.5 at time of eval   Time 6   Period Weeks  11/24/15   Status New   CC Long Term Goal  #3   Title Pt. will be knowledgeable about how and where to obtain daytime compression sleeve and glove, and nighttime garment if he wants one   Time 6   Period Weeks  11/24/15   Status New   CC Long Term Goal  #4   Title Pt. will be knowledgeable about lymphedema risk reduction practices   Time 6   Period Weeks  11/24/15   Status New            Plan - 09/25/15 1658    Clinical Impression Statement pt and wife very attentive and appeared to understand all instruction with return demonstration of exercise and manual technique for diaphragmatic breathing and manual lymph drainage stroke.  He will work on this at home and should benefit from compression wrapping as shedule allows   Clinical Impairments Affecting Rehab Potential stage 2 lymphedema with some keratotic changes on forearm    PT Next Visit Plan .  When he can be seen three times a week, begin complete decongestive therapy for right UE along with discussion of compression garment options for day and possibly nighttime.                                                                 Consulted and Agree with Plan of Care Patient;Family member/caregiver   Family Member Consulted wife          G-Codes - 09-27-2015 30-Mar-2052    Functional Assessment Tool Used clinical judgement   Functional Limitation Self care   Self Care Current Status [phone]) At least 80 percent but less than 100 percent impaired, limited or restricted   Self Care Goal Status (O3500) At least 1 percent but less than 20 percent impaired, limited or restricted      Problem List Patient Active Problem List   Diagnosis Date Noted  . Lymphedema 09/21/2015  . Encounter  for immunization 09/21/2015  . Otitis, externa, infective 08/10/2015  . Abnormal exercise myocardial perfusion study 04/02/2015  . Medicare annual wellness visit, subsequent 03/16/2015  . Paroxysmal atrial fibrillation - auto rate control 03/16/2015  . Hypertriglyceridemia 03/16/2015  . Bradycardia 12/30/2014  . Melanoma of back   . Mass of right chest wall 05/16/2014  . Mass of chest wall, right subfacial, 12cm s/p removal w rib resection 05/16/2014 03/08/2014  . Mass of posterior neck, SQ 4cm 03/08/2014  . Chest wall mass - right SQ anterior 5cm 08/21/2013  . Post herpetic neuralgia 07/09/2013  . Blepharitis of right lower eyelid 08/03/2012  . OSA on CPAP 07/30/2011  . ELEVATED PROSTATE SPECIFIC ANTIGEN 01/13/2010  . LIPOMA, SKIN 08/29/2008  . GERD 08/29/2008  . BENIGN PROSTATIC HYPERTROPHY 08/29/2008  . SHOULDER PAIN, LEFT 08/29/2008  . ELEVATED BP READING WITHOUT DX HYPERTENSION 08/29/2008   Donato Heinz. Owens Shark, PT   09/25/2015, 5:05 PM  [place], [place], [address] Phone: [phone]   Fax:  [phone]

## 2015-10-01 ENCOUNTER — Ambulatory Visit: Payer: Medicare Other | Attending: Family Medicine | Admitting: Physical Therapy

## 2015-10-01 DIAGNOSIS — I890 Lymphedema, not elsewhere classified: Secondary | ICD-10-CM | POA: Insufficient documentation

## 2015-10-01 NOTE — Therapy (Signed)
[name], [place], [address] Phone: [phone]   Fax:  [phone]  Physical Therapy Treatment  Patient Details  Name: [name] [name] MRN: [id number] Date of Birth: [birth year] Referring Provider:  Mosie Lukes, MD  Encounter Date: 10/01/2015      PT End of Session - 10/01/15 2100    Visit Number 3   Number of Visits 25   Date for PT Re-Evaluation 11/26/15   PT Start Time 3382   PT Stop Time 1610   PT Time Calculation (min) 49 min   Activity Tolerance Patient tolerated treatment well   Behavior During Therapy [name] [name] for tasks assessed/performed      Past Medical History  Diagnosis Date  . GERD (gastroesophageal reflux disease)     controlled w/ diet and behavioral changes  . BPH (benign prostatic hypertrophy)   . Elevated PSA   . Shingles 07/09/2013  . Encounter for Medicare annual wellness exam 09/22/2013    Sees Dr Wilhemina Bonito of Derm Sees Dr Silvano Rusk of Gastroenterology Sees Dr Kathie Rhodes of [facility] Sees Dr Susa Day of Optometry  . Snoring disorder 07/30/2011  . Sleep apnea 07/30/2011    cpap- 13   . Bradycardia 12/30/2014    HR routinely in mid 40s-50s  . New onset a-fib 03/10/2015  . Hypertriglyceridemia 03/16/2015  . White coat hypertension   . Melanoma of back     Excised Dr Wilhemina Bonito  . Coronary artery disease (CAD) excluded April 2016    False-positive nuclear stress test suggesting inferior ischemia  . Otitis, externa, infective 08/10/2015    Past Surgical History  Procedure Laterality Date  . Mass excision N/A 05/16/2014    Procedure: EXCISION POSTERIOR NECK MASS, RIGHT CHEST WALL MASS AND RIGHT AXILLARY MASS AXILLARY NODE DISSECTION;  Surgeon: Adin Hector, MD;  Location: [facility];  Service: General;  Laterality: N/A;  . Transthoracic echocardiogram  03/20/2015    Normal LV size and function. EF 60-65%. G1 DD. Trivial AI. Borderline aortic root dilation (41 mm),  Mild LA dilation.  Marland Kitchen Nm myoview ltd  03/19/2015    FALSE POSITIVE:  INTERMEDIATE RISK. Small sized, moderate intensity inferior ischemic perfusion defect  . Left heart catheterization with coronary angiogram N/A 04/10/2015    Procedure: LEFT HEART CATHETERIZATION WITH  CORONARY ANGIOGRAM;  Surgeon: Leonie Man, MD;  Location: [name] CATH LAB;  Service: Cardiovascular;  Angiographicallly NORMAL CORONARY ARTERIES    There were no vitals filed for this visit.  Visit Diagnosis:  Lymphedema of arm      Subjective Assessment - 10/01/15 1524    Subjective Has been doing manual lymph drainage once a day.   Currently in Pain? No/denies            [name] PT Assessment - 10/01/15 0001    Observation/Other Assessments   Skin Integrity several scrapes/abrasions on right arm today; no evidence of infection, but this was discussed with patient           LYMPHEDEMA/ONCOLOGY QUESTIONNAIRE - 10/01/15 1526    Right Upper Extremity Lymphedema   10 cm Proximal to Ulnar Styloid Process 32.6 cm   Across Hand at [employer] 27 cm   At [place] of 2nd Digit 8.3 cm                  [name] Adult PT Treatment/Exercise - 10/01/15 0001    Exercises   Other Exercises  UE lymphedema remedial  exercise with arm bandaged   Manual Therapy   Manual Therapy Edema management;Manual Lymphatic Drainage (MLD);Compression Bandaging   Manual therapy comments --   Manual Lymphatic Drainage (MLD) short neck, superficial and deep abdomen, left axilla and anterior interaxillary anastomosis, right groin and axillo-inguinal anastomosis, and right UE, including elbow, from fingers to shoulder.   Compression Bandaging Lotion applied.  Bandaging with stockinette, Elastomull x 2 on all five fingers, Artiflex x 2, and 1-6 cm., 1-10 cm., and 2-12 cm. bandages from hand to shoulder on right                PT Education - 10/01/15 2059    Education provided Yes   Education Details began explanation of  techniques of bandaging; about removing bandages in case of unrelieved pain or paresthesias with doing ROM   Person(s) Educated Patient;Spouse   Methods Explanation;Demonstration   Comprehension Need further instruction           Short Term Clinic Goals - 10/01/15 2103    CC Short Term Goal  #1   Title Pt. and or his wife will be knowledgeable about how to perform manual lymph drainage.   Status Partially [place] - 09/24/15 2049    CC Long Term Goal  #1   Title Right arm circumference at 15 cm. proximal to ulnar styloid reduced to 31 cm.   Baseline 35.7 cm. compared to 27.9 on left at time of eval   Time 6   Period Weeks  11/24/15   Status New   CC Long Term Goal  #2   Title Right hand circumference reduced to 24 cm.   Baseline 26.3 cm. compared to 22.5 at time of eval   Time 6   Period Weeks  11/24/15   Status New   CC Long Term Goal  #3   Title Pt. will be knowledgeable about how and where to obtain daytime compression sleeve and glove, and nighttime garment if he wants one   Time 6   Period Weeks  11/24/15   Status New   CC Long Term Goal  #4   Title Pt. will be knowledgeable about lymphedema risk reduction practices   Time 6   Period Weeks  11/24/15   Status New            Plan - 10/01/15 2100    Clinical Impression Statement Patient did well with bandaging today and understands ROM exercises and to remove bandages in case of unrelieved pain or paresthesias while in them.  Patient remarked that he had been using too much pressure when doing manual lymph drainage at home.  Circumferenc measurements were taken today at a few levels and showed slight changes only.                                           S                      Pt will benefit from skilled therapeutic intervention in order to improve on the following deficits Increased edema;Decreased knowledge of precautions;Decreased knowledge of use of DME   Rehab Potential  Excellent   PT Frequency 3x / week   PT Duration 6 weeks   PT Treatment/Interventions Manual lymph drainage;Compression bandaging;Patient/family education  PT Next Visit Plan Teach bandaging next visit, as patient was unable to be put on our schedule 3x/week, and is not being seen at all next week unless something opens up for him.   Consulted and Agree with Plan of Care Patient        Problem List Patient Active Problem List   Diagnosis Date Noted  . Lymphedema 09/21/2015  . Encounter for immunization 09/21/2015  . Otitis, externa, infective 08/10/2015  . Abnormal exercise myocardial perfusion study 04/02/2015  . Medicare annual wellness visit, subsequent 03/16/2015  . Paroxysmal atrial fibrillation - auto rate control 03/16/2015  . Hypertriglyceridemia 03/16/2015  . Bradycardia 12/30/2014  . Melanoma of back ([place])   . Mass of right chest wall 05/16/2014  . Mass of chest wall, right subfacial, 12cm s/p removal w rib resection 05/16/2014 03/08/2014  . Mass of posterior neck, SQ 4cm 03/08/2014  . Chest wall mass - right SQ anterior 5cm 08/21/2013  . Post herpetic neuralgia 07/09/2013  . Blepharitis of right lower eyelid 08/03/2012  . OSA on CPAP 07/30/2011  . ELEVATED PROSTATE SPECIFIC ANTIGEN 01/13/2010  . LIPOMA, SKIN 08/29/2008  . GERD 08/29/2008  . BENIGN PROSTATIC HYPERTROPHY 08/29/2008  . SHOULDER PAIN, LEFT 08/29/2008  . ELEVATED BP READING WITHOUT DX HYPERTENSION 08/29/2008    SALISBURY,DONNA 10/01/2015, 9:07 PM  [place], [place], [address] Phone: [phone]   Fax:  [place], PT 10/01/2015 9:07 PM

## 2015-10-02 ENCOUNTER — Ambulatory Visit: Payer: Medicare Other | Admitting: Physical Therapy

## 2015-10-02 DIAGNOSIS — I890 Lymphedema, not elsewhere classified: Secondary | ICD-10-CM

## 2015-10-02 NOTE — Patient Instructions (Signed)
[email or web address] Lymphatic flow series with Shoosh Lettick Crotzer 

## 2015-10-02 NOTE — Therapy (Signed)
[place], [place], [address] Phone: [phone]   Fax:  [phone]  Physical Therapy Treatment  Patient Details  Name: [name] [name] MRN: [id number] Date of Birth: [birth year] Referring Provider:  Mosie Lukes, MD  Encounter Date: 10/02/2015      PT End of Session - 10/02/15 1709    Visit Number 4   Number of Visits 25   Date for PT Re-Evaluation 11/26/15   PT Start Time 0375   PT Stop Time 1600   PT Time Calculation (min) 45 min   Activity Tolerance Patient tolerated treatment well   Behavior During Therapy [facility] for tasks assessed/performed      Past Medical History  Diagnosis Date  . GERD (gastroesophageal reflux disease)     controlled w/ diet and behavioral changes  . BPH (benign prostatic hypertrophy)   . Elevated PSA   . Shingles 07/09/2013  . Encounter for Medicare annual wellness exam 09/22/2013    Sees Dr Wilhemina Bonito of Derm Sees Dr Silvano Rusk of Gastroenterology Sees Dr Kathie Rhodes of [facility] Sees Dr Susa Day of Optometry  . Snoring disorder 07/30/2011  . Sleep apnea 07/30/2011    cpap- 13   . Bradycardia 12/30/2014    HR routinely in mid 40s-50s  . New onset a-fib 03/10/2015  . Hypertriglyceridemia 03/16/2015  . White coat hypertension   . Melanoma of back     Excised Dr Wilhemina Bonito  . Coronary artery disease (CAD) excluded April 2016    False-positive nuclear stress test suggesting inferior ischemia  . Otitis, externa, infective 08/10/2015    Past Surgical History  Procedure Laterality Date  . Mass excision N/A 05/16/2014    Procedure: EXCISION POSTERIOR NECK MASS, RIGHT CHEST WALL MASS AND RIGHT AXILLARY MASS AXILLARY NODE DISSECTION;  Surgeon: Adin Hector, MD;  Location: [facility];  Service: General;  Laterality: N/A;  . Transthoracic echocardiogram  03/20/2015    Normal LV size and function. EF 60-65%. G1 DD. Trivial AI. Borderline aortic root dilation (41 mm),  Mild LA dilation.  Marland Kitchen Nm myoview ltd  03/19/2015    FALSE POSITIVE:  INTERMEDIATE RISK. Small sized, moderate intensity inferior ischemic perfusion defect  . Left heart catheterization with coronary angiogram N/A 04/10/2015    Procedure: LEFT HEART CATHETERIZATION WITH  CORONARY ANGIOGRAM;  Surgeon: Leonie Man, MD;  Location: [facility] CATH LAB;  Service: Cardiovascular;  Angiographicallly NORMAL CORONARY ARTERIES    There were no vitals filed for this visit.  Visit Diagnosis:  Lymphedema of arm      Subjective Assessment - 10/02/15 1522    Subjective did ok with the bandages on .   Pertinent History Melanoma on mid back with no nodes removed; they told him they had good margins; no other treatment.  That was about two years ago.  Left shoulder joint needs to be replaced if it bothers him bad enough.  Recently diagnosed with  atrial fibrillation, but says he stopped drinking caffeine and that resolved it.   Had a lipoma removed a year ago or so, and that was in the area of the right axilla. He has ALND at that time  Patient Stated Goals get the swelling down   Currently in Pain? No/denies            [facility] PT Assessment - 10/02/15 0001    Observation/Other Assessments   Skin Integrity small opening on back of hand today with drainage on old stockinette. covered with piece of allevyn           LYMPHEDEMA/ONCOLOGY QUESTIONNAIRE - 10/02/15 1522    Right Upper Extremity Lymphedema   15 cm Proximal to Olecranon Process 32.9 cm   10 cm Proximal to Olecranon Process 34.3 cm   Olecranon Process 33.9 cm   15 cm Proximal to Ulnar Styloid Process 33.8 cm   10 cm Proximal to Ulnar Styloid Process 30 cm   Just Proximal to Ulnar Styloid Process 20.7 cm   Across Hand at [employer] 23.9 cm   At [place] of 2nd Digit 7.9 cm                  [facility] Adult PT Treatment/Exercise - 10/02/15 0001    Neck Exercises: Seated   Other  Seated Exercise brief neck range of motion. and lymphatic flow series    Manual Therapy   Manual therapy comments bandages removed and pt washed arm at sink after measurement    Manual Lymphatic Drainage (MLD) short neck, superficial and deep abdomen, left axilla and anterior interaxillary anastomosis, right groin and axillo-inguinal anastomosis, and right UE, including elbow, from fingers to shoulder.   Compression Bandaging small piece of allevyn applied to back of hand  Bandaging with thick  stockinette, Elastomull x 2 on all five fingers,with thick foam piece on back of hand.  Artiflex x 2, and 1-6 cm., 1-10 cm., and 2-12 cm. bandages from hand to shoulder on right                PT Education - 10/02/15 1706    Education provided Yes   Education Details lymphatic flow yoga series, pt to look for a glove at home he might use along with thick stockinette if no openings in schedule for him to return next week , issued handout about bandaging to wife and instructed as i went along today   Person(s) Educated Patient;Spouse   Methods Explanation;Demonstration  weblink   Comprehension Need further instruction           Short Term Clinic Goals - 10/01/15 2103    CC Short Term Goal  #1   Title Pt. and or his wife will be knowledgeable about how to perform manual lymph drainage.   Status Partially [place] - 09/24/15 2049    CC Long Term Goal  #1   Title Right arm circumference at 15 cm. proximal to ulnar styloid reduced to 31 cm.   Baseline 35.7 cm. compared to 27.9 on left at time of eval   Time 6   Period Weeks  11/24/15   Status New   CC Long Term Goal  #2   Title Right hand circumference reduced to 24 cm.   Baseline 26.3 cm. compared to 22.5 at time of eval   Time 6   Period Weeks  11/24/15   Status New   CC Long Term Goal  #3   Title Pt. will be knowledgeable about how and where to obtain daytime compression sleeve and glove, and  nighttime garment if he wants one   Time 6  Period Weeks  11/24/15   Status New   CC Long Term Goal  #4   Title Pt. will be knowledgeable about lymphedema risk reduction practices   Time 6   Period Weeks  11/24/15   Status New            Plan - 10/02/15 1709    Clinical Impression Statement good reductions after initial bandaging today and patient is encouraged.  He states he has been doing self manual lymph drainage along with video today and was able to verbalize procedure. More instruction on bandaging techniqu for home is needed   Pt will benefit from skilled therapeutic intervention in order to improve on the following deficits Increased edema;Decreased knowledge of precautions;Decreased knowledge of use of DME   Clinical Impairments Affecting Rehab Potential stage 2 lymphedema with some keratotic changes on forearm    PT Next Visit Plan continue  bandaging instruction. assess mastery of self manual lymph drainage ( check if pt still using DVD or can return it?)  Continue with manual lymph drainage and bandaging with skin care as needed        Problem List Patient Active Problem List   Diagnosis Date Noted  . Lymphedema 09/21/2015  . Encounter for immunization 09/21/2015  . Otitis, externa, infective 08/10/2015  . Abnormal exercise myocardial perfusion study 04/02/2015  . Medicare annual wellness visit, subsequent 03/16/2015  . Paroxysmal atrial fibrillation - auto rate control 03/16/2015  . Hypertriglyceridemia 03/16/2015  . Bradycardia 12/30/2014  . Melanoma of back ([place])   . Mass of right chest wall 05/16/2014  . Mass of chest wall, right subfacial, 12cm s/p removal w rib resection 05/16/2014 03/08/2014  . Mass of posterior neck, SQ 4cm 03/08/2014  . Chest wall mass - right SQ anterior 5cm 08/21/2013  . Post herpetic neuralgia 07/09/2013  . Blepharitis of right lower eyelid 08/03/2012  . OSA on CPAP 07/30/2011  . ELEVATED PROSTATE SPECIFIC ANTIGEN 01/13/2010  .  LIPOMA, SKIN 08/29/2008  . GERD 08/29/2008  . BENIGN PROSTATIC HYPERTROPHY 08/29/2008  . SHOULDER PAIN, LEFT 08/29/2008  . ELEVATED BP READING WITHOUT DX HYPERTENSION 08/29/2008   Donato Heinz. Owens Shark, PT   10/02/2015, 5:16 PM  [place], [place], [address] Phone: [phone]   Fax:  [phone]

## 2015-10-08 ENCOUNTER — Ambulatory Visit: Payer: Medicare Other

## 2015-10-08 DIAGNOSIS — I890 Lymphedema, not elsewhere classified: Secondary | ICD-10-CM | POA: Diagnosis not present

## 2015-10-08 NOTE — Therapy (Signed)
[place], [place], [address] Phone: [phone]   Fax:  [phone]  Physical Therapy Treatment  Patient Details  Name: [name] [name] MRN: [id number] Date of Birth: 07/23/1941 Referring Provider:  Mosie Lukes, MD  Encounter Date: 10/08/2015      PT End of Session - 10/08/15 1240    Visit Number 5   Number of Visits 25   Date for PT Re-Evaluation 11/26/15   PT Start Time 0939   PT Stop Time 1027   PT Time Calculation (min) 48 min   Activity Tolerance Patient tolerated treatment well   Behavior During Therapy [facility] for tasks assessed/performed      Past Medical History  Diagnosis Date  . GERD (gastroesophageal reflux disease)     controlled w/ diet and behavioral changes  . BPH (benign prostatic hypertrophy)   . Elevated PSA   . Shingles 07/09/2013  . Encounter for Medicare annual wellness exam 09/22/2013    Sees Dr Wilhemina Bonito of Derm Sees Dr Silvano Rusk of Gastroenterology Sees Dr Kathie Rhodes of [facility] Sees Dr Susa Day of Optometry  . Snoring disorder 07/30/2011  . Sleep apnea 07/30/2011    cpap- 13   . Bradycardia 12/30/2014    HR routinely in mid 40s-50s  . New onset a-fib 03/10/2015  . Hypertriglyceridemia 03/16/2015  . White coat hypertension   . Melanoma of back     Excised Dr Wilhemina Bonito  . Coronary artery disease (CAD) excluded April 2016    False-positive nuclear stress test suggesting inferior ischemia  . Otitis, externa, infective 08/10/2015    Past Surgical History  Procedure Laterality Date  . Mass excision N/A 05/16/2014    Procedure: EXCISION POSTERIOR NECK MASS, RIGHT CHEST WALL MASS AND RIGHT AXILLARY MASS AXILLARY NODE DISSECTION;  Surgeon: Adin Hector, MD;  Location: [facility];  Service: General;  Laterality: N/A;  . Transthoracic echocardiogram  03/20/2015    Normal LV size and function. EF 60-65%. G1 DD. Trivial AI. Borderline aortic root dilation (41  mm), Mild LA dilation.  Marland Kitchen Nm myoview ltd  03/19/2015    FALSE POSITIVE:  INTERMEDIATE RISK. Small sized, moderate intensity inferior ischemic perfusion defect  . Left heart catheterization with coronary angiogram N/A 04/10/2015    Procedure: LEFT HEART CATHETERIZATION WITH  CORONARY ANGIOGRAM;  Surgeon: Leonie Man, MD;  Location: [facility] CATH LAB;  Service: Cardiovascular;  Angiographicallly NORMAL CORONARY ARTERIES    There were no vitals filed for this visit.  Visit Diagnosis:  Lymphedema of arm      Subjective Assessment - 10/08/15 0944    Subjective My wife rewrapped my arm on Sunday and my hand looked really good, could see the veins!               LYMPHEDEMA/ONCOLOGY QUESTIONNAIRE - 10/08/15 0947    Right Upper Extremity Lymphedema   15 cm Proximal to Olecranon Process 34.7 cm   10 cm Proximal to Olecranon Process 32.9 cm   Olecranon Process 31.7 cm   15 cm Proximal to Ulnar Styloid Process 33.5 cm   10 cm Proximal to Ulnar Styloid Process 32 cm   Just Proximal to Ulnar Styloid Process 23.8 cm   Across Hand at [employer] 23.8 cm   At [place] of 2nd Digit 7.6 cm                  [facility] Adult PT Treatment/Exercise - 10/08/15 0001  Manual Therapy   Manual therapy comments bandages removed and pt washed arm at sink after measurement    Manual Lymphatic Drainage (MLD) short neck, superficial and deep abdomen, left axilla and anterior interaxillary anastomosis, right groin and axillo-inguinal anastomosis, and right UE, including elbow, from fingers to shoulder.   Compression Bandaging Bandaging with thick  stockinette, Elastomull x 2 on all five fingers,with thick foam piece on back of hand.  Artiflex x 1, and 1-6 cm., 1-10 cm., and 2-12 cm. bandages (using herring bone pattern with first 12 cm) from hand to shoulder on right                PT Education - 10/08/15 1252    Education provided Yes   Education Details Reviewed bandaging techniques and  correct sequence. (Pts wife had put some of 6 cm bandage on each finger per handout that pt is instructed to not use anymore, and issued him a new one)   Person(s) Educated Patient;Child(ren)  Granddaughter Burman Nieves was present   Methods Explanation;Demonstration;Handout   Comprehension Verbalized understanding           Short Term Clinic Goals - 10/08/15 1254    CC Short Term Goal  #1   Title Pt. and or his wife will be knowledgeable about how to perform manual lymph drainage.  Pt works on anastomosis when his arm is bandaged and is doing this well.    Status Achieved   CC Short Term Goal  #2   Title Right arm circumference at 15 cm. proximal to ulnar styloid reduced to 33 cm.  Goal almost met, reduction of 33.5 cm thus far 10/08/15   Status On-going   CC Short Term Goal  #3   Title Right hand circumference reduced to 25 cm.  23.8 cm attained 10/08/15   Status Achieved             Long Term Clinic Goals - 10/08/15 1258    CC Long Term Goal  #1   Title Right arm circumference at 15 cm. proximal to ulnar styloid reduced to 31 cm.  33.5 cm reduction thus far 10/08/15   Status On-going   CC Long Term Goal  #2   Title Right hand circumference reduced to 24 cm.  23.8 cm attained 10/08/15   Status Achieved   CC Long Term Goal  #3   Title Pt. will be knowledgeable about how and where to obtain daytime compression sleeve and glove, and nighttime garment if he wants one   Status On-going   CC Long Term Goal  #4   Title Pt. will be knowledgeable about lymphedema risk reduction practices   Status On-going            Plan - 10/08/15 1245    Clinical Impression Statement Mr. Placencia had good circumferential reductions this week in some areas but some slight increases as his wife rewrapped him Sunday and had some areas for improvement with technique. Instructed pt in this while bandaging pt today and he reported understanding this. His skin integrity looks very good with no  redness and no c/o itching/discomfort. Overall Mr. Schwager is tolerating treatment very well and being compliant with treatment.    Pt will benefit from skilled therapeutic intervention in order to improve on the following deficits Increased edema;Decreased knowledge of precautions;Decreased knowledge of use of DME   Rehab Potential Excellent   Clinical Impairments Affecting Rehab Potential stage 2 lymphedema with some keratotic changes on forearm  PT Frequency 3x / week   PT Duration 6 weeks   PT Treatment/Interventions Manual lymph drainage;Compression bandaging;Patient/family education   PT Next Visit Plan continue  bandaging instruction. assess mastery of self manual lymph drainage ( check if pt still using DVD or can return it?)  Continue with manual lymph drainage and bandaging with skin care as needed   Recommended Other Services Instructed pt to try to find gloves that will fit over bandages as his were wet today and his webspaces appeared macerated from the moisture.    Consulted and Agree with Plan of Care Patient        Problem List Patient Active Problem List   Diagnosis Date Noted  . Lymphedema 09/21/2015  . Encounter for immunization 09/21/2015  . Otitis, externa, infective 08/10/2015  . Abnormal exercise myocardial perfusion study 04/02/2015  . Medicare annual wellness visit, subsequent 03/16/2015  . Paroxysmal atrial fibrillation - auto rate control 03/16/2015  . Hypertriglyceridemia 03/16/2015  . Bradycardia 12/30/2014  . Melanoma of back ([place])   . Mass of right chest wall 05/16/2014  . Mass of chest wall, right subfacial, 12cm s/p removal w rib resection 05/16/2014 03/08/2014  . Mass of posterior neck, SQ 4cm 03/08/2014  . Chest wall mass - right SQ anterior 5cm 08/21/2013  . Post herpetic neuralgia 07/09/2013  . Blepharitis of right lower eyelid 08/03/2012  . OSA on CPAP 07/30/2011  . ELEVATED PROSTATE SPECIFIC ANTIGEN 01/13/2010  . LIPOMA, SKIN 08/29/2008  .  GERD 08/29/2008  . BENIGN PROSTATIC HYPERTROPHY 08/29/2008  . SHOULDER PAIN, LEFT 08/29/2008  . ELEVATED BP READING WITHOUT DX HYPERTENSION 08/29/2008    Otelia Limes, PTA 10/08/2015, 1:02 PM  [place], [place], [address] Phone: [phone]   Fax:  [phone]

## 2015-10-10 ENCOUNTER — Ambulatory Visit: Payer: Medicare Other | Admitting: Physical Therapy

## 2015-10-10 DIAGNOSIS — I890 Lymphedema, not elsewhere classified: Secondary | ICD-10-CM | POA: Diagnosis not present

## 2015-10-10 NOTE — Therapy (Signed)
[place], [place], [address] Phone: [phone]   Fax:  [phone]  Physical Therapy Treatment  Patient Details  Name: [name] [name] MRN: [id number] Date of Birth: 11-Sep-1941 No Data Recorded  Encounter Date: 10/10/2015      PT End of Session - 10/10/15 1215    Visit Number 6   Number of Visits 25   Date for PT Re-Evaluation 11/26/15   PT Start Time 1108   PT Stop Time 1202   PT Time Calculation (min) 54 min   Activity Tolerance Patient tolerated treatment well   Behavior During Therapy [facility] for tasks assessed/performed      Past Medical History  Diagnosis Date  . GERD (gastroesophageal reflux disease)     controlled w/ diet and behavioral changes  . BPH (benign prostatic hypertrophy)   . Elevated PSA   . Shingles 07/09/2013  . Encounter for Medicare annual wellness exam 09/22/2013    Sees Dr Wilhemina Bonito of Derm Sees Dr Silvano Rusk of Gastroenterology Sees Dr Kathie Rhodes of [facility] Sees Dr Susa Day of Optometry  . Snoring disorder 07/30/2011  . Sleep apnea 07/30/2011    cpap- 13   . Bradycardia 12/30/2014    HR routinely in mid 40s-50s  . New onset a-fib 03/10/2015  . Hypertriglyceridemia 03/16/2015  . White coat hypertension   . Melanoma of back     Excised Dr Wilhemina Bonito  . Coronary artery disease (CAD) excluded April 2016    False-positive nuclear stress test suggesting inferior ischemia  . Otitis, externa, infective 08/10/2015    Past Surgical History  Procedure Laterality Date  . Mass excision N/A 05/16/2014    Procedure: EXCISION POSTERIOR NECK MASS, RIGHT CHEST WALL MASS AND RIGHT AXILLARY MASS AXILLARY NODE DISSECTION;  Surgeon: Adin Hector, MD;  Location: [facility];  Service: General;  Laterality: N/A;  . Transthoracic echocardiogram  03/20/2015    Normal LV size and function. EF 60-65%. G1 DD. Trivial AI. Borderline aortic root dilation (41 mm), Mild LA dilation.  Marland Kitchen  Nm myoview ltd  03/19/2015    FALSE POSITIVE:  INTERMEDIATE RISK. Small sized, moderate intensity inferior ischemic perfusion defect  . Left heart catheterization with coronary angiogram N/A 04/10/2015    Procedure: LEFT HEART CATHETERIZATION WITH  CORONARY ANGIOGRAM;  Surgeon: Leonie Man, MD;  Location: [facility] CATH LAB;  Service: Cardiovascular;  Angiographicallly NORMAL CORONARY ARTERIES    There were no vitals filed for this visit.  Visit Diagnosis:  Lymphedema of arm      Subjective Assessment - 10/10/15 1106    Subjective Nothing new.  Bandages stayed on.   Currently in Pain? No/denies                         [facility] Adult PT Treatment/Exercise - 10/10/15 0001    Manual Therapy   Manual Lymphatic Drainage (MLD) short neck, superficial and deep abdomen, left axilla and anterior interaxillary anastomosis, right groin and axillo-inguinal anastomosis, and right UE, including elbow, from fingers to shoulder.   Compression Bandaging Bandaging with thick  stockinette, Elastomull x 2 on all five fingers,with thick foam piece on back of hand.  Artiflex x 1, and 1-6 cm., 1-10 cm., and 2-12 cm. bandages from hand to shoulder on right                   Short Term Clinic Goals - 10/08/15 1254  CC Short Term Goal  #1   Title Pt. and or his wife will be knowledgeable about how to perform manual lymph drainage.  Pt works on anastomosis when his arm is bandaged and is doing this well.    Status Achieved   CC Short Term Goal  #2   Title Right arm circumference at 15 cm. proximal to ulnar styloid reduced to 33 cm.  Goal almost met, reduction of 33.5 cm thus far 10/08/15   Status On-going   CC Short Term Goal  #3   Title Right hand circumference reduced to 25 cm.  23.8 cm attained 10/08/15   Status Achieved             Long Term Clinic Goals - 10/08/15 1258    CC Long Term Goal  #1   Title Right arm circumference at 15 cm. proximal to ulnar styloid reduced  to 31 cm.  33.5 cm reduction thus far 10/08/15   Status On-going   CC Long Term Goal  #2   Title Right hand circumference reduced to 24 cm.  23.8 cm attained 10/08/15   Status Achieved   CC Long Term Goal  #3   Title Pt. will be knowledgeable about how and where to obtain daytime compression sleeve and glove, and nighttime garment if he wants one   Status On-going   CC Long Term Goal  #4   Title Pt. will be knowledgeable about lymphedema risk reduction practices   Status On-going            Plan - 10/10/15 1215    Clinical Impression Statement Hand was not reduced as well this time as it had been earlier, but upper arm looked better.   Pt will benefit from skilled therapeutic intervention in order to improve on the following deficits Increased edema;Decreased knowledge of precautions;Decreased knowledge of use of DME   Rehab Potential Excellent   PT Frequency 3x / week   PT Duration 6 weeks   PT Treatment/Interventions Manual lymph drainage;Compression bandaging   PT Next Visit Plan continue  bandaging instruction. assess mastery of self manual lymph drainage ( check if pt still using DVD or can return it?)  Continue with manual lymph drainage and bandaging with skin care as needed   Consulted and Agree with Plan of Care Patient        Problem List Patient Active Problem List   Diagnosis Date Noted  . Lymphedema 09/21/2015  . Encounter for immunization 09/21/2015  . Otitis, externa, infective 08/10/2015  . Abnormal exercise myocardial perfusion study 04/02/2015  . Medicare annual wellness visit, subsequent 03/16/2015  . Paroxysmal atrial fibrillation - auto rate control 03/16/2015  . Hypertriglyceridemia 03/16/2015  . Bradycardia 12/30/2014  . Melanoma of back ([place])   . Mass of right chest wall 05/16/2014  . Mass of chest wall, right subfacial, 12cm s/p removal w rib resection 05/16/2014 03/08/2014  . Mass of posterior neck, SQ 4cm 03/08/2014  . Chest wall mass - right  SQ anterior 5cm 08/21/2013  . Post herpetic neuralgia 07/09/2013  . Blepharitis of right lower eyelid 08/03/2012  . OSA on CPAP 07/30/2011  . ELEVATED PROSTATE SPECIFIC ANTIGEN 01/13/2010  . LIPOMA, SKIN 08/29/2008  . GERD 08/29/2008  . BENIGN PROSTATIC HYPERTROPHY 08/29/2008  . SHOULDER PAIN, LEFT 08/29/2008  . ELEVATED BP READING WITHOUT DX HYPERTENSION 08/29/2008    Ericberto Padget 10/10/2015, 12:17 PM  [place], [place], [address] Phone: [phone]  Fax:  [phone]  Name: [name] [name] MRN: [id number] Date of Birth: 05/15/1941    Serafina Royals, PT 10/10/2015 12:17 PM

## 2015-10-13 ENCOUNTER — Ambulatory Visit: Payer: Medicare Other | Admitting: Physical Therapy

## 2015-10-13 ENCOUNTER — Encounter: Payer: Self-pay | Admitting: Physical Therapy

## 2015-10-13 DIAGNOSIS — I890 Lymphedema, not elsewhere classified: Secondary | ICD-10-CM

## 2015-10-13 NOTE — Therapy (Signed)
[place], [place], [address] Phone: [phone]   Fax:  [phone]  Physical Therapy Treatment  Patient Details  Name: [name] [name] MRN: [id number] Date of Birth: 12-Jan-1941 No Data Recorded  Encounter Date: 10/13/2015      PT End of Session - 10/13/15 1441    Visit Number 7   Number of Visits 25   Date for PT Re-Evaluation 11/26/15   PT Start Time 1430   PT Stop Time 1514   PT Time Calculation (min) 44 min   Activity Tolerance Patient tolerated treatment well   Behavior During Therapy [facility] for tasks assessed/performed      Past Medical History  Diagnosis Date  . GERD (gastroesophageal reflux disease)     controlled w/ diet and behavioral changes  . BPH (benign prostatic hypertrophy)   . Elevated PSA   . Shingles 07/09/2013  . Encounter for Medicare annual wellness exam 09/22/2013    Sees Dr Wilhemina Bonito of Derm Sees Dr Silvano Rusk of Gastroenterology Sees Dr Kathie Rhodes of [facility] Sees Dr Susa Day of Optometry  . Snoring disorder 07/30/2011  . Sleep apnea 07/30/2011    cpap- 13   . Bradycardia 12/30/2014    HR routinely in mid 40s-50s  . New onset a-fib ([place]) 03/10/2015  . Hypertriglyceridemia 03/16/2015  . White coat hypertension   . Melanoma of back ([place])     Excised Dr Wilhemina Bonito  . Coronary artery disease (CAD) excluded April 2016    False-positive nuclear stress test suggesting inferior ischemia  . Otitis, externa, infective 08/10/2015    Past Surgical History  Procedure Laterality Date  . Mass excision N/A 05/16/2014    Procedure: EXCISION POSTERIOR NECK MASS, RIGHT CHEST WALL MASS AND RIGHT AXILLARY MASS AXILLARY NODE DISSECTION;  Surgeon: Adin Hector, MD;  Location: [facility];  Service: General;  Laterality: N/A;  . Transthoracic echocardiogram  03/20/2015    Normal LV size and function. EF 60-65%. G1 DD. Trivial AI. Borderline aortic root dilation (41 mm), Mild LA  dilation.  Marland Kitchen Nm myoview ltd  03/19/2015    FALSE POSITIVE:  INTERMEDIATE RISK. Small sized, moderate intensity inferior ischemic perfusion defect  . Left heart catheterization with coronary angiogram N/A 04/10/2015    Procedure: LEFT HEART CATHETERIZATION WITH  CORONARY ANGIOGRAM;  Surgeon: Leonie Man, MD;  Location: [facility] CATH LAB;  Service: Cardiovascular;  Angiographicallly NORMAL CORONARY ARTERIES    There were no vitals filed for this visit.  Visit Diagnosis:  Lymphedema of arm      Subjective Assessment - 10/13/15 1433    Subjective Kept bandages on over the weekend.  Doing fine.   Pertinent History Melanoma on mid back with no nodes removed; they told him they had good margins; no other treatment.  That was about two years ago.  Left shoulder joint needs to be replaced if it bothers him bad enough.  Recently diagnosed with  atrial fibrillation, but says he stopped drinking caffeine and that resolved it.   Had a lipoma removed a year ago or so, and that was in the area of the right axilla. He has ALND at that time  Patient Stated Goals get the swelling down   Currently in Pain? No/denies           [facility] Adult PT Treatment/Exercise - 10/13/15 0001    Manual Therapy   Manual therapy comments bandages removed and pt washed arm at sink; arm slightly irritated at proximal aspect but no open area   Manual Lymphatic Drainage (MLD) short neck, superficial and deep abdomen, left axilla and anterior interaxillary anastomosis, right groin and axillo-inguinal anastomosis, and right UE, including elbow, from fingers to shoulder.   Compression Bandaging Bandaging with thick  stockinette, Elastomull x 2 on all five fingers,with thick foam piece on back of hand.  Artiflex x 1, and 1-6 cm., 1-10 cm., and 2-12 cm. bandages from hand to shoulder on right                   Short Term Clinic Goals - 10/08/15 1254    CC Short Term  Goal  #1   Title Pt. and or his wife will be knowledgeable about how to perform manual lymph drainage.  Pt works on anastomosis when his arm is bandaged and is doing this well.    Status Achieved   CC Short Term Goal  #2   Title Right arm circumference at 15 cm. proximal to ulnar styloid reduced to 33 cm.  Goal almost met, reduction of 33.5 cm thus far 10/08/15   Status On-going   CC Short Term Goal  #3   Title Right hand circumference reduced to 25 cm.  23.8 cm attained 10/08/15   Status Achieved             Long Term Clinic Goals - 10/08/15 1258    CC Long Term Goal  #1   Title Right arm circumference at 15 cm. proximal to ulnar styloid reduced to 31 cm.  33.5 cm reduction thus far 10/08/15   Status On-going   CC Long Term Goal  #2   Title Right hand circumference reduced to 24 cm.  23.8 cm attained 10/08/15   Status Achieved   CC Long Term Goal  #3   Title Pt. will be knowledgeable about how and where to obtain daytime compression sleeve and glove, and nighttime garment if he wants one   Status On-going   CC Long Term Goal  #4   Title Pt. will be knowledgeable about lymphedema risk reduction practices   Status On-going            Plan - 10/13/15 1513    Clinical Impression Statement Dorsal hand appears puffy but otherwise appears to be reducing well.   Pt will benefit from skilled therapeutic intervention in order to improve on the following deficits Increased edema;Decreased knowledge of precautions;Decreased knowledge of use of DME   Rehab Potential Excellent   Clinical Impairments Affecting Rehab Potential stage 2 lymphedema with some keratotic changes on forearm    PT Frequency 3x / week   PT Duration 6 weeks   PT Treatment/Interventions Manual lymph drainage;Compression bandaging   PT Next Visit Plan Continue with manual lymph drainage and bandaging with skin care as needed. When ready for daytime garments, also consider night time garment.   Consulted and  Agree with Plan of Care Patient        Problem List Patient Active Problem List   Diagnosis Date Noted  . Lymphedema 09/21/2015  . Encounter for immunization 09/21/2015  . Otitis, externa, infective 08/10/2015  . Abnormal exercise myocardial perfusion study 04/02/2015  .  Medicare annual wellness visit, subsequent 03/16/2015  . Paroxysmal atrial fibrillation - auto rate control 03/16/2015  . Hypertriglyceridemia 03/16/2015  . Bradycardia 12/30/2014  . Melanoma of back ([place])   . Mass of right chest wall 05/16/2014  . Mass of chest wall, right subfacial, 12cm s/p removal w rib resection 05/16/2014 03/08/2014  . Mass of posterior neck, SQ 4cm 03/08/2014  . Chest wall mass - right SQ anterior 5cm 08/21/2013  . Post herpetic neuralgia 07/09/2013  . Blepharitis of right lower eyelid 08/03/2012  . OSA on CPAP 07/30/2011  . ELEVATED PROSTATE SPECIFIC ANTIGEN 01/13/2010  . LIPOMA, SKIN 08/29/2008  . GERD 08/29/2008  . BENIGN PROSTATIC HYPERTROPHY 08/29/2008  . SHOULDER PAIN, LEFT 08/29/2008  . ELEVATED BP READING WITHOUT DX HYPERTENSION 08/29/2008    Annia Friendly, PT 10/13/2015 3:17 PM  [place], [place], [address] Phone: [phone]   Fax:  [phone]  Name: LATEEF JUNCAJ MRN: [id number] Date of Birth: 05/15/1941

## 2015-10-15 ENCOUNTER — Ambulatory Visit: Payer: Medicare Other

## 2015-10-15 DIAGNOSIS — I890 Lymphedema, not elsewhere classified: Secondary | ICD-10-CM

## 2015-10-15 NOTE — Therapy (Signed)
[place], [place], [address] Phone: [phone]   Fax:  [phone]  Physical Therapy Treatment  Patient Details  Name: [name] [name] MRN: [id number] Date of Birth: 30-Aug-1941 No Data Recorded  Encounter Date: 10/15/2015      PT End of Session - 10/15/15 1553    Visit Number 8   Number of Visits 25   Date for PT Re-Evaluation 11/26/15   PT Start Time 1300   PT Stop Time 1350   PT Time Calculation (min) 50 min   Activity Tolerance Patient tolerated treatment well   Behavior During Therapy [facility] for tasks assessed/performed      Past Medical History  Diagnosis Date  . GERD (gastroesophageal reflux disease)     controlled w/ diet and behavioral changes  . BPH (benign prostatic hypertrophy)   . Elevated PSA   . Shingles 07/09/2013  . Encounter for Medicare annual wellness exam 09/22/2013    Sees Dr Wilhemina Bonito of Derm Sees Dr Silvano Rusk of Gastroenterology Sees Dr Kathie Rhodes of [facility] Sees Dr Susa Day of Optometry  . Snoring disorder 07/30/2011  . Sleep apnea 07/30/2011    cpap- 13   . Bradycardia 12/30/2014    HR routinely in mid 40s-50s  . New onset a-fib ([place]) 03/10/2015  . Hypertriglyceridemia 03/16/2015  . White coat hypertension   . Melanoma of back ([place])     Excised Dr Wilhemina Bonito  . Coronary artery disease (CAD) excluded April 2016    False-positive nuclear stress test suggesting inferior ischemia  . Otitis, externa, infective 08/10/2015    Past Surgical History  Procedure Laterality Date  . Mass excision N/A 05/16/2014    Procedure: EXCISION POSTERIOR NECK MASS, RIGHT CHEST WALL MASS AND RIGHT AXILLARY MASS AXILLARY NODE DISSECTION;  Surgeon: Adin Hector, MD;  Location: [facility];  Service: General;  Laterality: N/A;  . Transthoracic echocardiogram  03/20/2015    Normal LV size and function. EF 60-65%. G1 DD. Trivial AI. Borderline aortic root dilation (41 mm), Mild LA  dilation.  Marland Kitchen Nm myoview ltd  03/19/2015    FALSE POSITIVE:  INTERMEDIATE RISK. Small sized, moderate intensity inferior ischemic perfusion defect  . Left heart catheterization with coronary angiogram N/A 04/10/2015    Procedure: LEFT HEART CATHETERIZATION WITH  CORONARY ANGIOGRAM;  Surgeon: Leonie Man, MD;  Location: [facility] CATH LAB;  Service: Cardiovascular;  Angiographicallly NORMAL CORONARY ARTERIES    There were no vitals filed for this visit.  Visit Diagnosis:  Lymphedema of arm      Subjective Assessment - 10/15/15 1505    Subjective Can't wait to get out of these bandages and get into my sleeve!   Currently in Pain? No/denies               LYMPHEDEMA/ONCOLOGY QUESTIONNAIRE - 10/15/15 1507    Right Upper Extremity Lymphedema   15 cm Proximal to Olecranon Process 33.6 cm   10 cm Proximal to Olecranon Process 33.4 cm   Olecranon Process 31.1 cm   15 cm Proximal to Ulnar Styloid Process 32.9 cm   10 cm Proximal to Ulnar Styloid Process 30.3 cm   Just Proximal to Ulnar Styloid Process 22.6 cm   Across Hand at [employer] 23.8 cm   At [place] of 2nd Digit 7.9 cm                  [facility] Adult PT Treatment/Exercise - 10/15/15 0001  Manual Therapy   Manual Lymphatic Drainage (MLD) short neck, superficial and deep abdomen, left axilla and anterior interaxillary anastomosis, right groin and axillo-inguinal anastomosis, and right UE, including elbow, from fingers to shoulder.   Compression Bandaging Bandaging with thick  stockinette, Elastomull x 2 on all five fingers,with thick foam piece on back of hand.  Artiflex x 1, and 1-6 cm., 1-10 cm., and 2-12 cm. bandages from hand to shoulder on right                   Short Term Clinic Goals - 10/15/15 1556    CC Short Term Goal  #2   Title Right arm circumference at 15 cm. proximal to ulnar styloid reduced to 33 cm.  32.9 cm reduction attained today 10/15/15   Status Achieved             Long  Term Clinic Goals - 10/15/15 1556    CC Long Term Goal  #1   Title Right arm circumference at 15 cm. proximal to ulnar styloid reduced to 31 cm.  32.9 cm reduction attained thus far 10/15/15   Status On-going   CC Long Term Goal  #3   Title Pt. will be knowledgeable about how and where to obtain daytime compression sleeve and glove, and nighttime garment if he wants one   Status On-going   CC Long Term Goal  #4   Title Pt. will be knowledgeable about lymphedema risk reduction practices   Status On-going            Plan - 10/15/15 1553    Clinical Impression Statement Pt had some good reductions with circumference measurements today and reports he is tolerating the bandages well; all short term goals met. Pt may be ready to get measured in the next week or 2.   Pt will benefit from skilled therapeutic intervention in order to improve on the following deficits Increased edema;Decreased knowledge of precautions;Decreased knowledge of use of DME   Rehab Potential Excellent   Clinical Impairments Affecting Rehab Potential stage 2 lymphedema with some keratotic changes on forearm    PT Frequency 3x / week   PT Duration 6 weeks   PT Treatment/Interventions Manual lymph drainage;Compression bandaging   PT Next Visit Plan Continue with manual lymph drainage and bandaging with skin care as needed. When ready for daytime garments, also consider night time garment.   Consulted and Agree with Plan of Care Patient        Problem List Patient Active Problem List   Diagnosis Date Noted  . Lymphedema 09/21/2015  . Encounter for immunization 09/21/2015  . Otitis, externa, infective 08/10/2015  . Abnormal exercise myocardial perfusion study 04/02/2015  . Medicare annual wellness visit, subsequent 03/16/2015  . Paroxysmal atrial fibrillation - auto rate control 03/16/2015  . Hypertriglyceridemia 03/16/2015  . Bradycardia 12/30/2014  . Melanoma of back ([place])   . Mass of right chest wall  05/16/2014  . Mass of chest wall, right subfacial, 12cm s/p removal w rib resection 05/16/2014 03/08/2014  . Mass of posterior neck, SQ 4cm 03/08/2014  . Chest wall mass - right SQ anterior 5cm 08/21/2013  . Post herpetic neuralgia 07/09/2013  . Blepharitis of right lower eyelid 08/03/2012  . OSA on CPAP 07/30/2011  . ELEVATED PROSTATE SPECIFIC ANTIGEN 01/13/2010  . LIPOMA, SKIN 08/29/2008  . GERD 08/29/2008  . BENIGN PROSTATIC HYPERTROPHY 08/29/2008  . SHOULDER PAIN, LEFT 08/29/2008  . ELEVATED BP READING WITHOUT DX HYPERTENSION 08/29/2008  Otelia Limes, PTA 10/15/2015, 3:58 PM  [place], [place], [address] Phone: [phone]   Fax:  [phone]  Name: MICKY SHELLER MRN: [id number] Date of Birth: 05-28-1941

## 2015-10-17 ENCOUNTER — Ambulatory Visit: Payer: Medicare Other | Admitting: Physical Therapy

## 2015-10-17 ENCOUNTER — Encounter: Payer: Self-pay | Admitting: Physical Therapy

## 2015-10-17 DIAGNOSIS — I890 Lymphedema, not elsewhere classified: Secondary | ICD-10-CM

## 2015-10-17 NOTE — Therapy (Signed)
[place], [place], [address] Phone: [phone]   Fax:  [phone]  Physical Therapy Treatment  Patient Details  Name: [name] [name] MRN: [id number] Date of Birth: [birth year] No Data Recorded  Encounter Date: 10/17/2015      PT End of Session - 10/17/15 0847    Visit Number 9   Number of Visits 25   Date for PT Re-Evaluation 11/26/15   PT Start Time 0844   PT Stop Time 0930   PT Time Calculation (min) 46 min   Activity Tolerance Patient tolerated treatment well   Behavior During Therapy [facility] for tasks assessed/performed      Past Medical History  Diagnosis Date  . GERD (gastroesophageal reflux disease)     controlled w/ diet and behavioral changes  . BPH (benign prostatic hypertrophy)   . Elevated PSA   . Shingles 07/09/2013  . Encounter for Medicare annual wellness exam 09/22/2013    Sees Dr Wilhemina Bonito of Derm Sees Dr Silvano Rusk of Gastroenterology Sees Dr Kathie Rhodes of [facility] Sees Dr Susa Day of Optometry  . Snoring disorder 07/30/2011  . Sleep apnea 07/30/2011    cpap- 13   . Bradycardia 12/30/2014    HR routinely in mid 40s-50s  . New onset a-fib ([place]) 03/10/2015  . Hypertriglyceridemia 03/16/2015  . White coat hypertension   . Melanoma of back ([place])     Excised Dr Wilhemina Bonito  . Coronary artery disease (CAD) excluded April 2016    False-positive nuclear stress test suggesting inferior ischemia  . Otitis, externa, infective 08/10/2015    Past Surgical History  Procedure Laterality Date  . Mass excision N/A 05/16/2014    Procedure: EXCISION POSTERIOR NECK MASS, RIGHT CHEST WALL MASS AND RIGHT AXILLARY MASS AXILLARY NODE DISSECTION;  Surgeon: Adin Hector, MD;  Location: [facility] ORS;  Service: General;  Laterality: N/A;  . Transthoracic echocardiogram  03/20/2015    Normal LV size and function. EF 60-65%. G1 DD. Trivial AI. Borderline aortic root dilation (41 mm), Mild LA  dilation.  Marland Kitchen Nm myoview ltd  03/19/2015    FALSE POSITIVE:  INTERMEDIATE RISK. Small sized, moderate intensity inferior ischemic perfusion defect  . Left heart catheterization with coronary angiogram N/A 04/10/2015    Procedure: LEFT HEART CATHETERIZATION WITH  CORONARY ANGIOGRAM;  Surgeon: Leonie Man, MD;  Location: [facility] CATH LAB;  Service: Cardiovascular;  Angiographicallly NORMAL CORONARY ARTERIES    There were no vitals filed for this visit.  Visit Diagnosis:  Lymphedema of arm      Subjective Assessment - 10/17/15 0846    Subjective Nothing is changed.  My hand seems to have gone a little but I'm doing well.   Pertinent History Melanoma on mid back with no nodes removed; they told him they had good margins; no other treatment.  That was about two years ago.  Left shoulder joint needs to be replaced if it bothers him bad enough.  Recently diagnosed with  atrial fibrillation, but says he stopped drinking caffeine and that resolved it.   Had a lipoma removed a year ago or so, and that was in the area of the right axilla. He has ALND at that time  Patient Stated Goals get the swelling down   Currently in Pain? No/denies           [facility] Adult PT Treatment/Exercise - 10/17/15 0001    Manual Therapy   Manual Lymphatic Drainage (MLD) short neck, superficial and deep abdomen, left axilla and anterior interaxillary anastomosis, right groin and axillo-inguinal anastomosis, and right UE, including elbow, from fingers to shoulder.   Compression Bandaging Bandaging with thick  stockinette, Elastomull x 2 on all five fingers,with thick foam piece on back of hand.  Artiflex x 1, and 1-6 cm., 1-10 cm., and 2-12 cm. bandages from hand to shoulder on right                   Short Term Clinic Goals - 10/15/15 1556    CC Short Term Goal  #2   Title Right arm circumference at 15 cm. proximal to ulnar styloid reduced to 33 cm.  32.9  cm reduction attained today 10/15/15   Status Achieved             Long Term Clinic Goals - 10/15/15 1556    CC Long Term Goal  #1   Title Right arm circumference at 15 cm. proximal to ulnar styloid reduced to 31 cm.  32.9 cm reduction attained thus far 10/15/15   Status On-going   CC Long Term Goal  #3   Title Pt. will be knowledgeable about how and where to obtain daytime compression sleeve and glove, and nighttime garment if he wants one   Status On-going   CC Long Term Goal  #4   Title Pt. will be knowledgeable about lymphedema risk reduction practices   Status On-going            Plan - 10/17/15 1050    Clinical Impression Statement Arm appears to continue to reduce.  He is doing very well and is pleased with his progress.  Will likely be ready to be measured fro garments in 2 weeks.   Pt will benefit from skilled therapeutic intervention in order to improve on the following deficits Increased edema;Decreased knowledge of precautions;Decreased knowledge of use of DME   Rehab Potential Excellent   Clinical Impairments Affecting Rehab Potential stage 2 lymphedema with some keratotic changes on forearm    PT Frequency 3x / week   PT Duration 6 weeks   PT Treatment/Interventions Manual lymph drainage;Compression bandaging   PT Next Visit Plan Continue with manual lymph drainage and bandaging with skin care as needed. When ready for daytime garments, also consider night time garment.   Consulted and Agree with Plan of Care Patient        Problem List Patient Active Problem List   Diagnosis Date Noted  . Lymphedema 09/21/2015  . Encounter for immunization 09/21/2015  . Otitis, externa, infective 08/10/2015  . Abnormal exercise myocardial perfusion study 04/02/2015  . Medicare annual wellness visit, subsequent 03/16/2015  . Paroxysmal atrial fibrillation - auto rate control 03/16/2015  . Hypertriglyceridemia 03/16/2015  . Bradycardia 12/30/2014  . Melanoma of back  ([place])   . Mass of right chest wall 05/16/2014  . Mass of chest wall, right subfacial, 12cm s/p removal w rib resection 05/16/2014 03/08/2014  . Mass of posterior neck, SQ 4cm 03/08/2014  . Chest wall mass - right SQ anterior 5cm 08/21/2013  . Post herpetic neuralgia 07/09/2013  . Blepharitis of right lower eyelid 08/03/2012  . OSA on CPAP 07/30/2011  . ELEVATED PROSTATE SPECIFIC ANTIGEN 01/13/2010  . LIPOMA,  SKIN 08/29/2008  . GERD 08/29/2008  . BENIGN PROSTATIC HYPERTROPHY 08/29/2008  . SHOULDER PAIN, LEFT 08/29/2008  . ELEVATED BP READING WITHOUT DX HYPERTENSION 08/29/2008   Annia Friendly, PT 10/17/2015 10:53 AM  [place], [place], [address] Phone: [phone]   Fax:  [phone]  Name: DICKSON KOSTELNIK MRN: [id number] Date of Birth: 12/17/1941

## 2015-10-20 ENCOUNTER — Ambulatory Visit: Payer: Medicare Other | Admitting: Physical Therapy

## 2015-10-20 DIAGNOSIS — I890 Lymphedema, not elsewhere classified: Secondary | ICD-10-CM

## 2015-10-20 NOTE — Therapy (Signed)
[place], [place], [address] Phone: [phone]   Fax:  [phone]  Physical Therapy Treatment  Patient Details  Name: [name] [name] MRN: [id number] Date of Birth: 08/17/1941 No Data Recorded  Encounter Date: 10/20/2015      PT End of Session - 10/20/15 1720    Visit Number 10   Number of Visits 25   Date for PT Re-Evaluation 11/26/15   PT Start Time 3154   PT Stop Time 1521   PT Time Calculation (min) 43 min   Activity Tolerance Patient tolerated treatment well   Behavior During Therapy [facility] for tasks assessed/performed      Past Medical History  Diagnosis Date  . GERD (gastroesophageal reflux disease)     controlled w/ diet and behavioral changes  . BPH (benign prostatic hypertrophy)   . Elevated PSA   . Shingles 07/09/2013  . Encounter for Medicare annual wellness exam 09/22/2013    Sees Dr Wilhemina Bonito of Derm Sees Dr Silvano Rusk of Gastroenterology Sees Dr Kathie Rhodes of [facility] Sees Dr Susa Day of Optometry  . Snoring disorder 07/30/2011  . Sleep apnea 07/30/2011    cpap- 13   . Bradycardia 12/30/2014    HR routinely in mid 40s-50s  . New onset a-fib ([place]) 03/10/2015  . Hypertriglyceridemia 03/16/2015  . White coat hypertension   . Melanoma of back ([place])     Excised Dr Wilhemina Bonito  . Coronary artery disease (CAD) excluded April 2016    False-positive nuclear stress test suggesting inferior ischemia  . Otitis, externa, infective 08/10/2015    Past Surgical History  Procedure Laterality Date  . Mass excision N/A 05/16/2014    Procedure: EXCISION POSTERIOR NECK MASS, RIGHT CHEST WALL MASS AND RIGHT AXILLARY MASS AXILLARY NODE DISSECTION;  Surgeon: Adin Hector, MD;  Location: [facility];  Service: General;  Laterality: N/A;  . Transthoracic echocardiogram  03/20/2015    Normal LV size and function. EF 60-65%. G1 DD. Trivial AI. Borderline aortic root dilation (41 mm), Mild LA  dilation.  Marland Kitchen Nm myoview ltd  03/19/2015    FALSE POSITIVE:  INTERMEDIATE RISK. Small sized, moderate intensity inferior ischemic perfusion defect  . Left heart catheterization with coronary angiogram N/A 04/10/2015    Procedure: LEFT HEART CATHETERIZATION WITH  CORONARY ANGIOGRAM;  Surgeon: Leonie Man, MD;  Location: [facility] CATH LAB;  Service: Cardiovascular;  Angiographicallly NORMAL CORONARY ARTERIES    There were no vitals filed for this visit.  Visit Diagnosis:  Lymphedema of arm      Subjective Assessment - 10/20/15 1442    Subjective Had to take the outer wrap off because it got loose, but my wife rewrapped me last night.   Currently in Pain? No/denies                         [facility] Adult PT Treatment/Exercise - 10/20/15 0001    Manual Therapy   Manual Therapy Edema management   Manual Lymphatic Drainage (MLD) short neck, superficial and deep abdomen, left axilla and anterior interaxillary anastomosis, right groin and axillo-inguinal anastomosis, and right UE, including elbow, from fingers to shoulder.   Compression Bandaging Bandaging with thick  stockinette, Elastomull x 2 on all five fingers,with thick foam piece on back of hand.  Artiflex x 1, and 1-6 cm., 1-10 cm., and 2-12 cm. bandages from hand to shoulder on right  Short Term Clinic Goals - 10/15/15 1556    CC Short Term Goal  #2   Title Right arm circumference at 15 cm. proximal to ulnar styloid reduced to 33 cm.  32.9 cm reduction attained today 10/15/15   Status Achieved             Long Term Clinic Goals - 10/15/15 1556    CC Long Term Goal  #1   Title Right arm circumference at 15 cm. proximal to ulnar styloid reduced to 31 cm.  32.9 cm reduction attained thus far 10/15/15   Status On-going   CC Long Term Goal  #3   Title Pt. will be knowledgeable about how and where to obtain daytime compression sleeve and glove, and nighttime garment if he wants one    Status On-going   CC Long Term Goal  #4   Title Pt. will be knowledgeable about lymphedema risk reduction practices   Status On-going            Plan - 02-Nov-2015 1720    Clinical Impression Statement Patient is pleased with progress to date and wants to continue as long as progress does.     Pt will benefit from skilled therapeutic intervention in order to improve on the following deficits Increased edema;Decreased knowledge of precautions;Decreased knowledge of use of DME   Rehab Potential Excellent   PT Frequency 3x / week   PT Duration 6 weeks   PT Treatment/Interventions Manual lymph drainage;Compression bandaging   PT Next Visit Plan Remeasure. Continue with manual lymph drainage and bandaging with skin care as needed. When ready for daytime garments, also consider night time garment.   Consulted and Agree with Plan of Care Patient          G-Codes - 2015/11/02 1722    Functional Assessment Tool Used clinical judgement   Functional Limitation Self care   Self Care Current Status [phone]) At least 60 percent but less than 80 percent impaired, limited or restricted   Self Care Goal Status (Y6503) At least 1 percent but less than 20 percent impaired, limited or restricted      Problem List Patient Active Problem List   Diagnosis Date Noted  . Lymphedema 09/21/2015  . Encounter for immunization 09/21/2015  . Otitis, externa, infective 08/10/2015  . Abnormal exercise myocardial perfusion study 04/02/2015  . Medicare annual wellness visit, subsequent 03/16/2015  . Paroxysmal atrial fibrillation - auto rate control 03/16/2015  . Hypertriglyceridemia 03/16/2015  . Bradycardia 12/30/2014  . Melanoma of back ([place])   . Mass of right chest wall 05/16/2014  . Mass of chest wall, right subfacial, 12cm s/p removal w rib resection 05/16/2014 03/08/2014  . Mass of posterior neck, SQ 4cm 03/08/2014  . Chest wall mass - right SQ anterior 5cm 08/21/2013  . Post herpetic neuralgia  07/09/2013  . Blepharitis of right lower eyelid 08/03/2012  . OSA on CPAP 07/30/2011  . ELEVATED PROSTATE SPECIFIC ANTIGEN 01/13/2010  . LIPOMA, SKIN 08/29/2008  . GERD 08/29/2008  . BENIGN PROSTATIC HYPERTROPHY 08/29/2008  . SHOULDER PAIN, LEFT 08/29/2008  . ELEVATED BP READING WITHOUT DX HYPERTENSION 08/29/2008    Amandalynn Pitz 2015-11-02, 5:24 PM  [place], [place], [address] Phone: [phone]   Fax:  [phone]  Name: [name] [name] MRN: [id number] Date of Birth: 08-Mar-1941    Serafina Royals, PT November 02, 2015 5:24 PM

## 2015-10-21 NOTE — Therapy (Signed)
[place], [place], [address] Phone: [phone]   Fax:  [phone]  Physical Therapy Treatment  Patient Details  Name: [name] [name] MRN: [id number] Date of Birth: [birth year] No Data Recorded  Encounter Date: 10/20/2015      PT End of Session - 10/20/15 1720    Visit Number 10   Number of Visits 25   Date for PT Re-Evaluation 11/26/15   PT Start Time 3202   PT Stop Time 1521   PT Time Calculation (min) 43 min   Activity Tolerance Patient tolerated treatment well   Behavior During Therapy [facility] for tasks assessed/performed      Past Medical History  Diagnosis Date  . GERD (gastroesophageal reflux disease)     controlled w/ diet and behavioral changes  . BPH (benign prostatic hypertrophy)   . Elevated PSA   . Shingles 07/09/2013  . Encounter for Medicare annual wellness exam 09/22/2013    Sees Dr Wilhemina Bonito of Derm Sees Dr Silvano Rusk of Gastroenterology Sees Dr Kathie Rhodes of [facility] Sees Dr Susa Day of Optometry  . Snoring disorder 07/30/2011  . Sleep apnea 07/30/2011    cpap- 13   . Bradycardia 12/30/2014    HR routinely in mid 40s-50s  . New onset a-fib ([place]) 03/10/2015  . Hypertriglyceridemia 03/16/2015  . White coat hypertension   . Melanoma of back ([place])     Excised Dr Wilhemina Bonito  . Coronary artery disease (CAD) excluded April 2016    False-positive nuclear stress test suggesting inferior ischemia  . Otitis, externa, infective 08/10/2015    Past Surgical History  Procedure Laterality Date  . Mass excision N/A 05/16/2014    Procedure: EXCISION POSTERIOR NECK MASS, RIGHT CHEST WALL MASS AND RIGHT AXILLARY MASS AXILLARY NODE DISSECTION;  Surgeon: Adin Hector, MD;  Location: [facility];  Service: General;  Laterality: N/A;  . Transthoracic echocardiogram  03/20/2015    Normal LV size and function. EF 60-65%. G1 DD. Trivial AI. Borderline aortic root dilation (41 mm), Mild LA  dilation.  Marland Kitchen Nm myoview ltd  03/19/2015    FALSE POSITIVE:  INTERMEDIATE RISK. Small sized, moderate intensity inferior ischemic perfusion defect  . Left heart catheterization with coronary angiogram N/A 04/10/2015    Procedure: LEFT HEART CATHETERIZATION WITH  CORONARY ANGIOGRAM;  Surgeon: Leonie Man, MD;  Location: [facility] CATH LAB;  Service: Cardiovascular;  Angiographicallly NORMAL CORONARY ARTERIES    There were no vitals filed for this visit.  Visit Diagnosis:  Lymphedema of arm      Subjective Assessment - 10/20/15 1442    Subjective Had to take the outer wrap off because it got loose, but my wife rewrapped me last night.   Currently in Pain? No/denies                                    Short Term Clinic Goals - 10/15/15 1556    CC Short Term Goal  #2   Title Right arm circumference at 15 cm. proximal to ulnar styloid reduced to 33 cm.  32.9 cm reduction attained today 10/15/15   Status Achieved             Long Term Clinic Goals - 10/15/15 1556    CC Long Term Goal  #1   Title Right arm circumference at 15 cm. proximal to ulnar styloid reduced to 31  cm.  32.9 cm reduction attained thus far 10/15/15   Status On-going   CC Long Term Goal  #3   Title Pt. will be knowledgeable about how and where to obtain daytime compression sleeve and glove, and nighttime garment if he wants one   Status On-going   CC Long Term Goal  #4   Title Pt. will be knowledgeable about lymphedema risk reduction practices   Status On-going            Plan - 10/26/15 1720    Clinical Impression Statement Patient is pleased with progress to date and wants to continue as long as progress does.     Pt will benefit from skilled therapeutic intervention in order to improve on the following deficits Increased edema;Decreased knowledge of precautions;Decreased knowledge of use of DME   Rehab Potential Excellent   PT Frequency 3x / week   PT Duration 6 weeks   PT  Treatment/Interventions Manual lymph drainage;Compression bandaging   PT Next Visit Plan Remeasure. Continue with manual lymph drainage and bandaging with skin care as needed. When ready for daytime garments, also consider night time garment.   Consulted and Agree with Plan of Care Patient          G-Codes - 10/26/2015 1722    Functional Assessment Tool Used clinical judgement   Functional Limitation Self care   Self Care Current Status [phone]) At least 60 percent but less than 80 percent impaired, limited or restricted   Self Care Goal Status (I1443) At least 1 percent but less than 20 percent impaired, limited or restricted      Problem List Patient Active Problem List   Diagnosis Date Noted  . Lymphedema 09/21/2015  . Encounter for immunization 09/21/2015  . Otitis, externa, infective 08/10/2015  . Abnormal exercise myocardial perfusion study 04/02/2015  . Medicare annual wellness visit, subsequent 03/16/2015  . Paroxysmal atrial fibrillation - auto rate control 03/16/2015  . Hypertriglyceridemia 03/16/2015  . Bradycardia 12/30/2014  . Melanoma of back ([place])   . Mass of right chest wall 05/16/2014  . Mass of chest wall, right subfacial, 12cm s/p removal w rib resection 05/16/2014 03/08/2014  . Mass of posterior neck, SQ 4cm 03/08/2014  . Chest wall mass - right SQ anterior 5cm 08/21/2013  . Post herpetic neuralgia 07/09/2013  . Blepharitis of right lower eyelid 08/03/2012  . OSA on CPAP 07/30/2011  . ELEVATED PROSTATE SPECIFIC ANTIGEN 01/13/2010  . LIPOMA, SKIN 08/29/2008  . GERD 08/29/2008  . BENIGN PROSTATIC HYPERTROPHY 08/29/2008  . SHOULDER PAIN, LEFT 08/29/2008  . ELEVATED BP READING WITHOUT DX HYPERTENSION 08/29/2008    SALISBURY,DONNA 10/21/2015, 8:37 AM  [place], [place], [address] Phone: [phone]   Fax:  [phone]  Name: [name] [name] MRN: [id number] Date of Birth:  1941/10/09    Physical Therapy Progress Note  Dates of Reporting Period: 09/24/15 to 10/26/2015  Objective Reports of Subjective Statement: Patient is showing good reductions in right arm circumferences as a result of complete decongestive therapy treatments.  Objective Measurements: Right arm circumference at 15 cm. proximal to ulnar styloid reduced to 32.9 cm. as of 10/15/15  Goal Update: Please see above.  Plan: Continue complete decongestive therapy until edema reductions plateau, then have patient measured for compression garments; continue bandaging until garments are received.  Reason Skilled Services are Required: Lymphedema of right UE requires complete decongestive therapy for improvement.  Serafina Royals, PT 10/21/2015 8:42 AM

## 2015-10-22 ENCOUNTER — Ambulatory Visit: Payer: Medicare Other | Admitting: Physical Therapy

## 2015-10-22 DIAGNOSIS — I890 Lymphedema, not elsewhere classified: Secondary | ICD-10-CM | POA: Diagnosis not present

## 2015-10-22 NOTE — Therapy (Signed)
[name] [name], [place], [address] Phone: [phone]   Fax:  [phone]  Physical Therapy Treatment  Patient Details  Name: [name] [name] MRN: [id number] Date of Birth: [birth year] No Data Recorded  Encounter Date: 10/22/2015      PT End of Session - 10/22/15 1838    Visit Number 11   Number of Visits 25   Date for PT Re-Evaluation 11/26/15   PT Start Time 1430   PT Stop Time 1515   PT Time Calculation (min) 45 min   Activity Tolerance Patient tolerated treatment well   Behavior During Therapy [facility] for tasks assessed/performed      Past Medical History  Diagnosis Date  . GERD (gastroesophageal reflux disease)     controlled w/ diet and behavioral changes  . BPH (benign prostatic hypertrophy)   . Elevated PSA   . Shingles 07/09/2013  . Encounter for Medicare annual wellness exam 09/22/2013    Sees Dr Wilhemina Bonito of Derm Sees Dr Silvano Rusk of Gastroenterology Sees Dr Kathie Rhodes of [facility] Sees Dr Susa Day of Optometry  . Snoring disorder 07/30/2011  . Sleep apnea 07/30/2011    cpap- 13   . Bradycardia 12/30/2014    HR routinely in mid 40s-50s  . New onset a-fib ([place]) 03/10/2015  . Hypertriglyceridemia 03/16/2015  . White coat hypertension   . Melanoma of back ([place])     Excised Dr Wilhemina Bonito  . Coronary artery disease (CAD) excluded April 2016    False-positive nuclear stress test suggesting inferior ischemia  . Otitis, externa, infective 08/10/2015    Past Surgical History  Procedure Laterality Date  . Mass excision N/A 05/16/2014    Procedure: EXCISION POSTERIOR NECK MASS, RIGHT CHEST WALL MASS AND RIGHT AXILLARY MASS AXILLARY NODE DISSECTION;  Surgeon: Adin Hector, MD;  Location: [facility];  Service: General;  Laterality: N/A;  . Transthoracic echocardiogram  03/20/2015    Normal LV size and function. EF 60-65%. G1 DD. Trivial AI. Borderline aortic root dilation (41 mm), Mild LA  dilation.  Marland Kitchen Nm myoview ltd  03/19/2015    FALSE POSITIVE:  INTERMEDIATE RISK. Small sized, moderate intensity inferior ischemic perfusion defect  . Left heart catheterization with coronary angiogram N/A 04/10/2015    Procedure: LEFT HEART CATHETERIZATION WITH  CORONARY ANGIOGRAM;  Surgeon: Leonie Man, MD;  Location: [facility] CATH LAB;  Service: Cardiovascular;  Angiographicallly NORMAL CORONARY ARTERIES    There were no vitals filed for this visit.  Visit Diagnosis:  Lymphedema of arm      Subjective Assessment - 10/22/15 1438    Subjective pt reports he is having problems with post herpatic neuralgia in right upper arm, he had shingles 2 1/2 years ago    Pertinent History Melanoma on mid back with no nodes removed; they told him they had good margins; no other treatment.  That was about two years ago.  Left shoulder joint needs to be replaced if it bothers him bad enough.  Recently diagnosed with  atrial fibrillation, but says he stopped drinking caffeine and that resolved it.   Had a lipoma removed a year ago or so, and that was in the area of the right axilla. He has ALND at that time  Currently in Pain? Yes  post herpatic neuralgia    Pain Score 2    Pain Location Chest   Pain Orientation Right   Pain Radiating Towards under arm to shoulder blade   Pain Onset More than a month ago   Pain Frequency Intermittent   Aggravating Factors  neuralgia is worse at end of day    Pain Relieving Factors pt states he has tried everything and still has the pain that comes and goes   Effect of Pain on Daily Activities pt can do what he needs to during the day                LYMPHEDEMA/ONCOLOGY QUESTIONNAIRE - 10/22/15 1445    Right Upper Extremity Lymphedema   15 cm Proximal to Olecranon Process 34.5 cm   10 cm Proximal to Olecranon Process 32.6 cm   Olecranon Process 32.5 cm   15 cm Proximal to Ulnar Styloid Process 33.5 cm   10  cm Proximal to Ulnar Styloid Process 31.8 cm   Just Proximal to Ulnar Styloid Process 21.1 cm   Across Hand at [employer] 22.6 cm   At [place] of 2nd Digit 7.5 cm                  [facility] Adult PT Treatment/Exercise - 10/22/15 0001    Manual Therapy   Manual Lymphatic Drainage (MLD) brief manual lymph draiange to right forearm with patient in supine    Compression Bandaging Bandaging with thick  stockinette, Elastomull x 2 on all five fingers,with thick foam piece on back of hand.  Artiflex x2 over lined peach foam at forearm and 1-6 cm., 1-10 cm., and 2-12 cm. bandages from hand to shoulder on right with herringbone at forearm                    Short Term Clinic Goals - 10/15/15 1556    CC Short Term Goal  #2   Title Right arm circumference at 15 cm. proximal to ulnar styloid reduced to 33 cm.  32.9 cm reduction attained today 10/15/15   Status Achieved             Long Term Clinic Goals - 10/15/15 1556    CC Long Term Goal  #1   Title Right arm circumference at 15 cm. proximal to ulnar styloid reduced to 31 cm.  32.9 cm reduction attained thus far 10/15/15   Status On-going   CC Long Term Goal  #3   Title Pt. will be knowledgeable about how and where to obtain daytime compression sleeve and glove, and nighttime garment if he wants one   Status On-going   CC Long Term Goal  #4   Title Pt. will be knowledgeable about lymphedema risk reduction practices   Status On-going            Plan - 10/22/15 1839    Clinical Impression Statement Pt hand had good reduction today and overall he has had reduction since start of treatment,  He is soon ready for measurement for flat knit compression sleeve    Pt will benefit from skilled therapeutic intervention in order to improve on the following deficits Increased edema;Decreased knowledge of precautions;Decreased knowledge of use of DME   Rehab Potential Excellent   Clinical Impairments Affecting Rehab  Potential stage 2 lymphedema with some keratotic changes on forearm    PT Next Visit Plan Assess use of foam on forearm. Continue with manual lymph  drainage and bandaging with skin care as needed. When ready for daytime garments, also consider night time garment.   Consulted and Agree with Plan of Care Patient   Family Member Consulted wife        Problem List Patient Active Problem List   Diagnosis Date Noted  . Lymphedema 09/21/2015  . Encounter for immunization 09/21/2015  . Otitis, externa, infective 08/10/2015  . Abnormal exercise myocardial perfusion study 04/02/2015  . Medicare annual wellness visit, subsequent 03/16/2015  . Paroxysmal atrial fibrillation - auto rate control 03/16/2015  . Hypertriglyceridemia 03/16/2015  . Bradycardia 12/30/2014  . Melanoma of back ([place])   . Mass of right chest wall 05/16/2014  . Mass of chest wall, right subfacial, 12cm s/p removal w rib resection 05/16/2014 03/08/2014  . Mass of posterior neck, SQ 4cm 03/08/2014  . Chest wall mass - right SQ anterior 5cm 08/21/2013  . Post herpetic neuralgia 07/09/2013  . Blepharitis of right lower eyelid 08/03/2012  . OSA on CPAP 07/30/2011  . ELEVATED PROSTATE SPECIFIC ANTIGEN 01/13/2010  . LIPOMA, SKIN 08/29/2008  . GERD 08/29/2008  . BENIGN PROSTATIC HYPERTROPHY 08/29/2008  . SHOULDER PAIN, LEFT 08/29/2008  . ELEVATED BP READING WITHOUT DX HYPERTENSION 08/29/2008   [name] [name], PT  10/22/2015, 6:41 PM  [place], [place], [address] Phone: [phone]   Fax:  [phone]  Name: [name] [name] MRN: [id number] Date of Birth: 1941/02/05

## 2015-10-24 ENCOUNTER — Ambulatory Visit: Payer: Medicare Other | Admitting: Physical Therapy

## 2015-10-24 DIAGNOSIS — I890 Lymphedema, not elsewhere classified: Secondary | ICD-10-CM

## 2015-10-24 NOTE — Therapy (Signed)
[place], [place], [address] Phone: [phone]   Fax:  [phone]  Physical Therapy Treatment  Patient Details  Name: [name] [name] MRN: [id number] Date of Birth: Apr 25, 1941 No Data Recorded  Encounter Date: 10/24/2015      PT End of Session - 10/24/15 1228    Visit Number 12   Number of Visits 25   Date for PT Re-Evaluation 11/26/15   PT Start Time 1034   PT Stop Time 1118   PT Time Calculation (min) 44 min   Activity Tolerance Patient tolerated treatment well   Behavior During Therapy [facility] for tasks assessed/performed      Past Medical History  Diagnosis Date  . GERD (gastroesophageal reflux disease)     controlled w/ diet and behavioral changes  . BPH (benign prostatic hypertrophy)   . Elevated PSA   . Shingles 07/09/2013  . Encounter for Medicare annual wellness exam 09/22/2013    Sees Dr Wilhemina Bonito of Derm Sees Dr Silvano Rusk of Gastroenterology Sees Dr Kathie Rhodes of [facility] Sees Dr Susa Day of Optometry  . Snoring disorder 07/30/2011  . Sleep apnea 07/30/2011    cpap- 13   . Bradycardia 12/30/2014    HR routinely in mid 40s-50s  . New onset a-fib ([place]) 03/10/2015  . Hypertriglyceridemia 03/16/2015  . White coat hypertension   . Melanoma of back ([place])     Excised Dr Wilhemina Bonito  . Coronary artery disease (CAD) excluded April 2016    False-positive nuclear stress test suggesting inferior ischemia  . Otitis, externa, infective 08/10/2015    Past Surgical History  Procedure Laterality Date  . Mass excision N/A 05/16/2014    Procedure: EXCISION POSTERIOR NECK MASS, RIGHT CHEST WALL MASS AND RIGHT AXILLARY MASS AXILLARY NODE DISSECTION;  Surgeon: Adin Hector, MD;  Location: [facility];  Service: General;  Laterality: N/A;  . Transthoracic echocardiogram  03/20/2015    Normal LV size and function. EF 60-65%. G1 DD. Trivial AI. Borderline aortic root dilation (41 mm), Mild LA  dilation.  Marland Kitchen Nm myoview ltd  03/19/2015    FALSE POSITIVE:  INTERMEDIATE RISK. Small sized, moderate intensity inferior ischemic perfusion defect  . Left heart catheterization with coronary angiogram N/A 04/10/2015    Procedure: LEFT HEART CATHETERIZATION WITH  CORONARY ANGIOGRAM;  Surgeon: Leonie Man, MD;  Location: [facility] CATH LAB;  Service: Cardiovascular;  Angiographicallly NORMAL CORONARY ARTERIES    There were no vitals filed for this visit.  Visit Diagnosis:  Lymphedema of arm      Subjective Assessment - 10/24/15 1036    Subjective still having neuralgia pain, but not different from before    Pertinent History Melanoma on mid back with no nodes removed; they told him they had good margins; no other treatment.  That was about two years ago.  Left shoulder joint needs to be replaced if it bothers him bad enough.  Recently diagnosed with  atrial fibrillation, but says he stopped drinking caffeine and that resolved it.   Had a lipoma removed a year ago or so, and that was in the area of the right axilla. He has ALND at that time  Currently in Pain? Yes  post herpatic neurlagia    Pain Score 2                          [facility] Adult PT Treatment/Exercise - 10/24/15 0001    Exercises   Other Exercises  reviewd lymphatic flow yoga series and remedial exercise    Manual Therapy   Manual Lymphatic Drainage (MLD) short neck, superficial and deep abdomen, left axilla and anterior interaxillary anastomosis, right groin and axillo-inguinal anastomosis, and right UE, including elbow, from fingers to shoulder.   Compression Bandaging allevyn placed at red area at ulnar styloid and proximal foreamr over red areas Bandaging with thick  stockinette, Elastomull x 2 on all five fingers,with thick foam piece on back of hand.  Artiflex x2 over lined peach foam at forearm and 1-6 cm., 1-10 cm., and 2-12 cm. bandages from hand to  shoulder on right with herringbone at forearm                    Short Term Clinic Goals - 10/15/15 1556    CC Short Term Goal  #2   Title Right arm circumference at 15 cm. proximal to ulnar styloid reduced to 33 cm.  32.9 cm reduction attained today 10/15/15   Status Achieved             Long Term Clinic Goals - 10/15/15 1556    CC Long Term Goal  #1   Title Right arm circumference at 15 cm. proximal to ulnar styloid reduced to 31 cm.  32.9 cm reduction attained thus far 10/15/15   Status On-going   CC Long Term Goal  #3   Title Pt. will be knowledgeable about how and where to obtain daytime compression sleeve and glove, and nighttime garment if he wants one   Status On-going   CC Long Term Goal  #4   Title Pt. will be knowledgeable about lymphedema risk reduction practices   Status On-going            Plan - 10/24/15 1230    Clinical Impression Statement Pt appears to continue reduction at hand and forearm. Red area noted near ulnar styloid and proximal forearm that were padded with allevyn today under bandages. information sent to [facility] to have compression sleeve and glove measured.    Clinical Impairments Affecting Rehab Potential stage 2 lymphedema with some keratotic changes on forearm    PT Next Visit Plan check skin for red areas. . Continue with manual lymph drainage and bandaging with skin care as needed. When ready for daytime garments, also consider night time garment.        Problem List Patient Active Problem List   Diagnosis Date Noted  . Lymphedema 09/21/2015  . Encounter for immunization 09/21/2015  . Otitis, externa, infective 08/10/2015  . Abnormal exercise myocardial perfusion study 04/02/2015  . Medicare annual wellness visit, subsequent 03/16/2015  . Paroxysmal atrial fibrillation - auto rate control 03/16/2015  . Hypertriglyceridemia 03/16/2015  . Bradycardia 12/30/2014  . Melanoma of back ([place])   . Mass of right chest wall  05/16/2014  . Mass of chest wall, right subfacial, 12cm s/p removal w rib resection 05/16/2014 03/08/2014  . Mass of posterior neck, SQ 4cm 03/08/2014  . Chest wall mass - right SQ anterior 5cm 08/21/2013  . Post herpetic neuralgia 07/09/2013  . Blepharitis of right lower eyelid 08/03/2012  . OSA on CPAP 07/30/2011  . ELEVATED  PROSTATE SPECIFIC ANTIGEN 01/13/2010  . LIPOMA, SKIN 08/29/2008  . GERD 08/29/2008  . BENIGN PROSTATIC HYPERTROPHY 08/29/2008  . SHOULDER PAIN, LEFT 08/29/2008  . ELEVATED BP READING WITHOUT DX HYPERTENSION 08/29/2008   Donato Heinz. Owens Shark, PT  10/24/2015, 12:34 PM  [place], [place], [address] Phone: [phone]   Fax:  [phone]  Name: [name] [name] MRN: [id number] Date of Birth: Dec 17, 1941

## 2015-10-27 ENCOUNTER — Ambulatory Visit: Payer: Medicare Other | Admitting: Physical Therapy

## 2015-10-27 DIAGNOSIS — I890 Lymphedema, not elsewhere classified: Secondary | ICD-10-CM

## 2015-10-27 NOTE — Therapy (Signed)
[place], [place], [address] Phone: [phone]   Fax:  [phone]  Physical Therapy Treatment  Patient Details  Name: [name] [name] MRN: [id number] Date of Birth: 05-31-1941 Referring Provider: Dr. Willette Alma  Encounter Date: 10/27/2015      PT End of Session - 10/27/15 1719    Visit Number 13   Number of Visits 25   Date for PT Re-Evaluation 11/26/15   PT Start Time 1440   PT Stop Time 1522   PT Time Calculation (min) 42 min   Activity Tolerance Patient tolerated treatment well   Behavior During Therapy [facility] for tasks assessed/performed      Past Medical History  Diagnosis Date  . GERD (gastroesophageal reflux disease)     controlled w/ diet and behavioral changes  . BPH (benign prostatic hypertrophy)   . Elevated PSA   . Shingles 07/09/2013  . Encounter for Medicare annual wellness exam 09/22/2013    Sees Dr Wilhemina Bonito of Derm Sees Dr Silvano Rusk of Gastroenterology Sees Dr Kathie Rhodes of [facility] Sees Dr Susa Day of Optometry  . Snoring disorder 07/30/2011  . Sleep apnea 07/30/2011    cpap- 13   . Bradycardia 12/30/2014    HR routinely in mid 40s-50s  . New onset a-fib ([place]) 03/10/2015  . Hypertriglyceridemia 03/16/2015  . White coat hypertension   . Melanoma of back ([place])     Excised Dr Wilhemina Bonito  . Coronary artery disease (CAD) excluded April 2016    False-positive nuclear stress test suggesting inferior ischemia  . Otitis, externa, infective 08/10/2015    Past Surgical History  Procedure Laterality Date  . Mass excision N/A 05/16/2014    Procedure: EXCISION POSTERIOR NECK MASS, RIGHT CHEST WALL MASS AND RIGHT AXILLARY MASS AXILLARY NODE DISSECTION;  Surgeon: Adin Hector, MD;  Location: [facility];  Service: General;  Laterality: N/A;  . Transthoracic echocardiogram  03/20/2015    Normal LV size and function. EF 60-65%. G1 DD. Trivial AI. Borderline aortic root dilation  (41 mm), Mild LA dilation.  Marland Kitchen Nm myoview ltd  03/19/2015    FALSE POSITIVE:  INTERMEDIATE RISK. Small sized, moderate intensity inferior ischemic perfusion defect  . Left heart catheterization with coronary angiogram N/A 04/10/2015    Procedure: LEFT HEART CATHETERIZATION WITH  CORONARY ANGIOGRAM;  Surgeon: Leonie Man, MD;  Location: [facility] CATH LAB;  Service: Cardiovascular;  Angiographicallly NORMAL CORONARY ARTERIES    There were no vitals filed for this visit.  Visit Diagnosis:  Lymphedema of arm      Subjective Assessment - 10/27/15 1443    Subjective It's getting smaller--looking much better.   Currently in Pain? No/denies  not in arm            [facility] PT Assessment - 10/27/15 0001    Assessment   Referring Provider Dr. Willette Alma                     [facility] Adult PT Treatment/Exercise - 10/27/15 0001    Manual Therapy   Manual Lymphatic Drainage (MLD) short neck, superficial and deep abdomen, left axilla and anterior interaxillary anastomosis, right groin and axillo-inguinal anastomosis, and right UE, including elbow, from fingers to shoulder.   Compression Bandaging Bandaging with thick  stockinette, Elastomull x 1 on all five fingers,with thick foam piece on back of hand.  Artiflex x2 over lined peach foam at forearm and 1-6 cm., 1-10 cm., and  2-12 cm. bandages from hand to shoulder on right with herringbone at forearm                    Short Term Clinic Goals - 10/15/15 1556    CC Short Term Goal  #2   Title Right arm circumference at 15 cm. proximal to ulnar styloid reduced to 33 cm.  32.9 cm reduction attained today 10/15/15   Status Achieved             Long Term Clinic Goals - 10/15/15 1556    CC Long Term Goal  #1   Title Right arm circumference at 15 cm. proximal to ulnar styloid reduced to 31 cm.  32.9 cm reduction attained thus far 10/15/15   Status On-going   CC Long Term Goal  #3   Title Pt. will be knowledgeable about  how and where to obtain daytime compression sleeve and glove, and nighttime garment if he wants one   Status On-going   CC Long Term Goal  #4   Title Pt. will be knowledgeable about lymphedema risk reduction practices   Status On-going            Plan - 10/27/15 1719    Clinical Impression Statement Did well with bandaging.  Areas that had been covered with Allevyn bandage looked good today.  Patient hasn't heard from vendor yet re: compression garments.   Pt will benefit from skilled therapeutic intervention in order to improve on the following deficits Increased edema;Decreased knowledge of precautions;Decreased knowledge of use of DME   Rehab Potential Excellent   PT Frequency 3x / week   PT Duration 6 weeks   PT Treatment/Interventions Manual lymph drainage;Compression bandaging   PT Next Visit Plan Continue with manual lymph drainage and bandaging with skin care as needed. When ready for daytime garments, also consider night time garment.   Consulted and Agree with Plan of Care Patient        Problem List Patient Active Problem List   Diagnosis Date Noted  . Lymphedema 09/21/2015  . Encounter for immunization 09/21/2015  . Otitis, externa, infective 08/10/2015  . Abnormal exercise myocardial perfusion study 04/02/2015  . Medicare annual wellness visit, subsequent 03/16/2015  . Paroxysmal atrial fibrillation - auto rate control 03/16/2015  . Hypertriglyceridemia 03/16/2015  . Bradycardia 12/30/2014  . Melanoma of back ([place])   . Mass of right chest wall 05/16/2014  . Mass of chest wall, right subfacial, 12cm s/p removal w rib resection 05/16/2014 03/08/2014  . Mass of posterior neck, SQ 4cm 03/08/2014  . Chest wall mass - right SQ anterior 5cm 08/21/2013  . Post herpetic neuralgia 07/09/2013  . Blepharitis of right lower eyelid 08/03/2012  . OSA on CPAP 07/30/2011  . ELEVATED PROSTATE SPECIFIC ANTIGEN 01/13/2010  . LIPOMA, SKIN 08/29/2008  . GERD 08/29/2008  . BENIGN  PROSTATIC HYPERTROPHY 08/29/2008  . SHOULDER PAIN, LEFT 08/29/2008  . ELEVATED BP READING WITHOUT DX HYPERTENSION 08/29/2008    SALISBURY,DONNA 10/27/2015, 5:21 PM  [place], [place], [address] Phone: [phone]   Fax:  [phone]  Name: [name] [name] MRN: [id number] Date of Birth: 1941-06-14    Serafina Royals, PT 10/27/2015 5:21 PM

## 2015-10-29 ENCOUNTER — Ambulatory Visit: Payer: Medicare Other | Attending: Family Medicine

## 2015-10-29 DIAGNOSIS — I890 Lymphedema, not elsewhere classified: Secondary | ICD-10-CM | POA: Diagnosis not present

## 2015-10-29 NOTE — Therapy (Signed)
[place], [place], [address] Phone: [phone]   Fax:  [phone]  Physical Therapy Treatment  Patient Details  Name: [name] [name] MRN: [id number] Date of Birth: [birth year] Referring Provider: Dr. Willette Alma  Encounter Date: 10/29/2015      PT End of Session - 10/29/15 1521    Visit Number 14   Number of Visits 25   Date for PT Re-Evaluation 11/26/15   PT Start Time 1440   PT Stop Time 1520   PT Time Calculation (min) 40 min   Activity Tolerance Patient tolerated treatment well   Behavior During Therapy [facility] for tasks assessed/performed      Past Medical History  Diagnosis Date  . GERD (gastroesophageal reflux disease)     controlled w/ diet and behavioral changes  . BPH (benign prostatic hypertrophy)   . Elevated PSA   . Shingles 07/09/2013  . Encounter for Medicare annual wellness exam 09/22/2013    Sees Dr Wilhemina Bonito of Derm Sees Dr Silvano Rusk of Gastroenterology Sees Dr Kathie Rhodes of [facility] Sees Dr Susa Day of Optometry  . Snoring disorder 07/30/2011  . Sleep apnea 07/30/2011    cpap- 13   . Bradycardia 12/30/2014    HR routinely in mid 40s-50s  . New onset a-fib ([place]) 03/10/2015  . Hypertriglyceridemia 03/16/2015  . White coat hypertension   . Melanoma of back ([place])     Excised Dr Wilhemina Bonito  . Coronary artery disease (CAD) excluded April 2016    False-positive nuclear stress test suggesting inferior ischemia  . Otitis, externa, infective 08/10/2015    Past Surgical History  Procedure Laterality Date  . Mass excision N/A 05/16/2014    Procedure: EXCISION POSTERIOR NECK MASS, RIGHT CHEST WALL MASS AND RIGHT AXILLARY MASS AXILLARY NODE DISSECTION;  Surgeon: Adin Hector, MD;  Location: [facility];  Service: General;  Laterality: N/A;  . Transthoracic echocardiogram  03/20/2015    Normal LV size and function. EF 60-65%. G1 DD. Trivial AI. Borderline aortic root dilation  (41 mm), Mild LA dilation.  Marland Kitchen Nm myoview ltd  03/19/2015    FALSE POSITIVE:  INTERMEDIATE RISK. Small sized, moderate intensity inferior ischemic perfusion defect  . Left heart catheterization with coronary angiogram N/A 04/10/2015    Procedure: LEFT HEART CATHETERIZATION WITH  CORONARY ANGIOGRAM;  Surgeon: Leonie Man, MD;  Location: [facility] CATH LAB;  Service: Cardiovascular;  Angiographicallly NORMAL CORONARY ARTERIES    There were no vitals filed for this visit.  Visit Diagnosis:  Lymphedema of arm      Subjective Assessment - 10/29/15 1443    Subjective Arm is doing okay, getting meausred for my compression garments Friday.               LYMPHEDEMA/ONCOLOGY QUESTIONNAIRE - 10/29/15 1444    Right Upper Extremity Lymphedema   15 cm Proximal to Olecranon Process 34.6 cm   10 cm Proximal to Olecranon Process 32.7 cm   Olecranon Process 33.5 cm   15 cm Proximal to Ulnar Styloid Process 33.1 cm   10 cm Proximal to Ulnar Styloid Process 29.6 cm   Just Proximal to Ulnar Styloid Process 21.6 cm   Across Hand at [employer] 22.7 cm   At [place] of 2nd Digit 7.6 cm                  [facility] Adult PT Treatment/Exercise - 10/29/15 0001    Manual Therapy  Manual Lymphatic Drainage (MLD) short neck, superficial and deep abdomen, left axilla and upper quadrant and anterior interaxillary anastomosis, right groin and axillo-inguinal anastomosis, and right UE, including elbow, from fingers to shoulder.   Compression Bandaging Bandaging with thick  stockinette, Elastomull x 1 on all five fingers,with thick foam piece on back of hand.  Artiflex x2 over lined peach foam at forearm and 1-6 cm., 1-10 cm., and 2-12 cm. bandages from hand to shoulder on right with herringbone at forearm                    Short Term Clinic Goals - 10/15/15 1556    CC Short Term Goal  #2   Title Right arm circumference at 15 cm. proximal to ulnar styloid reduced to 33 cm.  32.9 cm reduction  attained today 10/15/15   Status Achieved             Long Term Clinic Goals - 10/29/15 1523    CC Long Term Goal  #1   Title Right arm circumference at 15 cm. proximal to ulnar styloid reduced to 31 cm.  33.1 today 10/29/15   CC Long Term Goal  #3   Title Pt. will be knowledgeable about how and where to obtain daytime compression sleeve and glove, and nighttime garment if he wants one  Gets measured Friday11/4/16   Status Achieved   CC Long Term Goal  #4   Title Pt. will be knowledgeable about lymphedema risk reduction practices   Status Achieved            Plan - 10/29/15 1522    Clinical Impression Statement Continues to do well with bandaging, had good reduction at 10 cm proximal to his ulnar styloid, progress toward goal. Pt gets measured for compression garments on Friday.    Pt will benefit from skilled therapeutic intervention in order to improve on the following deficits Increased edema;Decreased knowledge of precautions;Decreased knowledge of use of DME   Rehab Potential Excellent   Clinical Impairments Affecting Rehab Potential stage 2 lymphedema with some keratotic changes on forearm    PT Frequency 3x / week   PT Duration 6 weeks   PT Treatment/Interventions Manual lymph drainage;Compression bandaging   PT Next Visit Plan Continue with manual lymph drainage and bandaging with skin care as needed. When ready for daytime garments, also consider night time garment.   Consulted and Agree with Plan of Care Patient        Problem List Patient Active Problem List   Diagnosis Date Noted  . Lymphedema 09/21/2015  . Encounter for immunization 09/21/2015  . Otitis, externa, infective 08/10/2015  . Abnormal exercise myocardial perfusion study 04/02/2015  . Medicare annual wellness visit, subsequent 03/16/2015  . Paroxysmal atrial fibrillation - auto rate control 03/16/2015  . Hypertriglyceridemia 03/16/2015  . Bradycardia 12/30/2014  . Melanoma of back ([place])   .  Mass of right chest wall 05/16/2014  . Mass of chest wall, right subfacial, 12cm s/p removal w rib resection 05/16/2014 03/08/2014  . Mass of posterior neck, SQ 4cm 03/08/2014  . Chest wall mass - right SQ anterior 5cm 08/21/2013  . Post herpetic neuralgia 07/09/2013  . Blepharitis of right lower eyelid 08/03/2012  . OSA on CPAP 07/30/2011  . ELEVATED PROSTATE SPECIFIC ANTIGEN 01/13/2010  . LIPOMA, SKIN 08/29/2008  . GERD 08/29/2008  . BENIGN PROSTATIC HYPERTROPHY 08/29/2008  . SHOULDER PAIN, LEFT 08/29/2008  . ELEVATED BP READING WITHOUT DX HYPERTENSION 08/29/2008  Otelia Limes, PTA 10/29/2015, 3:25 PM  [place], [place], [address] Phone: [phone]   Fax:  [phone]  Name: [name] [name] MRN: [id number] Date of Birth: [birth year]

## 2015-10-31 ENCOUNTER — Ambulatory Visit (INDEPENDENT_AMBULATORY_CARE_PROVIDER_SITE_OTHER): Payer: Medicare Other | Admitting: Family Medicine

## 2015-10-31 ENCOUNTER — Encounter: Payer: Self-pay | Admitting: Family Medicine

## 2015-10-31 VITALS — HR 89 | Temp 98.0°F | Ht 70.0 in | Wt 236.1 lb

## 2015-10-31 DIAGNOSIS — R030 Elevated blood-pressure reading, without diagnosis of hypertension: Secondary | ICD-10-CM

## 2015-10-31 DIAGNOSIS — R001 Bradycardia, unspecified: Secondary | ICD-10-CM | POA: Diagnosis not present

## 2015-10-31 DIAGNOSIS — Z23 Encounter for immunization: Secondary | ICD-10-CM | POA: Diagnosis not present

## 2015-10-31 DIAGNOSIS — K219 Gastro-esophageal reflux disease without esophagitis: Secondary | ICD-10-CM

## 2015-10-31 DIAGNOSIS — I890 Lymphedema, not elsewhere classified: Secondary | ICD-10-CM | POA: Diagnosis not present

## 2015-10-31 MED ORDER — GABAPENTIN 300 MG PO CAPS
900.0000 mg | ORAL_CAPSULE | Freq: Three times a day (TID) | ORAL | Status: DC
Start: 2015-10-31 — End: 2016-02-17

## 2015-10-31 NOTE — Progress Notes (Signed)
Pre visit review using our clinic review tool, if applicable. No additional management support is needed unless otherwise documented below in the visit note. 

## 2015-10-31 NOTE — Patient Instructions (Signed)
Lymphedema  Lymphedema is swelling that is caused by the abnormal collection of lymph under the skin. Lymph is fluid from the tissues in your body that travels in the lymphatic system. This system is part of the immune system and includes lymph nodes and lymph vessels. The lymph vessels collect and carry the excess fluid, fats, proteins, and wastes from the tissues of the body to the bloodstream. This system also works to clean and remove bacteria and waste products from the body.  Lymphedema occurs when the lymphatic system is blocked. When the lymph vessels or lymph nodes are blocked or damaged, lymph does not drain properly, causing an abnormal buildup of lymph. This leads to swelling in the arms or legs. Lymphedema cannot be cured by medicines, but various methods can be used to help reduce the swelling.  CAUSES  There are two types of lymphedema. Primary lymphedema is caused by the absence or abnormality of the lymph vessel at birth. Secondary lymphedema is more common. It occurs when the lymph vessel is damaged or blocked. Common causes of lymph vessel blockage include:  · Skin infection, such as cellulitis.  · Infection by parasites (filariasis).  · Injury.  · Cancer.  · Radiation therapy.  · Formation of scar tissue.  · Surgery.  SYMPTOMS  Symptoms of this condition include:  · Swelling of the arm or leg.  · A heavy or tight feeling in the arm or leg.  · Swelling of the feet, toes, or fingers. Shoes or rings may fit more tightly than before.  · Redness of the skin over the affected area.  · Limited movement of the affected limb.  · Sensitivity to touch or discomfort in the affected limb.  DIAGNOSIS  This condition may be diagnosed with:  · A physical exam.  · Medical history.  · Imaging tests, such as:    Lymphoscintigraphy. In this test, a low dose of a radioactive substance is injected to trace the flow of lymph through the lymph vessels.    MRI.    CT scan.    Duplex ultrasound. This test uses sound waves  to produce images of the vessels and the blood flow on a screen.    Lymphangiography. In this test, a contrast dye is injected into the lymph vessel to help show blockages.  TREATMENT  Treatment for this condition may depend on the cause. Treatment may include:  · Exercise. Certain exercises can help fluid move out of the affected limb.  · Massage. Gentle massage of the affected limb can help move the fluid out of the area.  · Compression. Various methods may be used to apply pressure to the affected limb in order to reduce the swelling.    Wearing compression stockings or sleeves on the affected limb.    Bandaging the affected limb.    Using an external pump that is attached to a sleeve that alternates between applying pressure and releasing pressure.  · Surgery. This is usually only done for severe cases. For example, surgery may be done if you have trouble moving the limb or if the swelling does not get better with other treatments.  If an underlying condition is causing the lymphedema, treatment for that condition is needed. For example, antibiotic medicines may be used to treat an infection.  HOME CARE INSTRUCTIONS  Activities  · Exercise regularly as directed by your health care provider.  · Do not sit with your legs crossed.  · When possible, keep the affected limb   raised (elevated) above the level of your heart.  · Avoid carrying things with an arm that is affected by lymphedema.  · Remember that the affected area is more likely to become injured or infected.  · Take these steps to help prevent infection:    Keep the affected area clean and dry.    Protect your skin from cuts. For example, you should use gloves while cooking or gardening. Do not walk barefoot. If you shave the affected area, use an electric razor.  General Instructions  · Take medicines only as directed by your health care provider.  · Eat a healthy diet that includes a lot of fruits and vegetables.  · Do not wear tight clothes, shoes, or  jewelry.  · Do not use heating pads over the affected area.  · Avoid having blood pressure checked on the affected limb.  · Keep all follow-up visits as directed by your health care provider. This is important.  SEEK MEDICAL CARE IF:  · You continue to have swelling in your limb.  · You have a fever.  · You have a cut that does not heal.  · You have redness or pain in the affected area.  · You have new swelling in your limb that comes on suddenly.  · You develop purplish spots or sores (lesions) on your limb.  SEEK IMMEDIATE MEDICAL CARE IF:  · You have a skin rash.  · You have chills or sweats.  · You have shortness of breath.     This information is not intended to replace advice given to you by your health care provider. Make sure you discuss any questions you have with your health care provider.     Document Released: 10/10/2007 Document Revised: 04/29/2015 Document Reviewed: 11/20/2014  Elsevier Interactive Patient Education ©2016 [employer]

## 2015-11-03 ENCOUNTER — Ambulatory Visit: Payer: Medicare Other | Admitting: Physical Therapy

## 2015-11-03 DIAGNOSIS — I890 Lymphedema, not elsewhere classified: Secondary | ICD-10-CM

## 2015-11-03 NOTE — Therapy (Signed)
[place], [place], [address] Phone: [phone]   Fax:  [phone]  Physical Therapy Treatment  Patient Details  Name: [name] [name] MRN: [id number] Date of Birth: [birth year] Referring Provider: Dr. Willette Alma  Encounter Date: 11/03/2015      PT End of Session - 11/03/15 1620    Visit Number 15   Number of Visits 25   Date for PT Re-Evaluation 11/26/15   PT Start Time 6195   PT Stop Time 1520   PT Time Calculation (min) 46 min   Activity Tolerance Patient tolerated treatment well   Behavior During Therapy [facility] for tasks assessed/performed      Past Medical History  Diagnosis Date  . GERD (gastroesophageal reflux disease)     controlled w/ diet and behavioral changes  . BPH (benign prostatic hypertrophy)   . Elevated PSA   . Shingles 07/09/2013  . Encounter for Medicare annual wellness exam 09/22/2013    Sees Dr Wilhemina Bonito of Derm Sees Dr Silvano Rusk of Gastroenterology Sees Dr Kathie Rhodes of [facility] Sees Dr Susa Day of Optometry  . Snoring disorder 07/30/2011  . Sleep apnea 07/30/2011    cpap- 13   . Bradycardia 12/30/2014    HR routinely in mid 40s-50s  . New onset a-fib ([place]) 03/10/2015  . Hypertriglyceridemia 03/16/2015  . White coat hypertension   . Melanoma of back ([place])     Excised Dr Wilhemina Bonito  . Coronary artery disease (CAD) excluded April 2016    False-positive nuclear stress test suggesting inferior ischemia  . Otitis, externa, infective 08/10/2015    Past Surgical History  Procedure Laterality Date  . Mass excision N/A 05/16/2014    Procedure: EXCISION POSTERIOR NECK MASS, RIGHT CHEST WALL MASS AND RIGHT AXILLARY MASS AXILLARY NODE DISSECTION;  Surgeon: Adin Hector, MD;  Location: [facility];  Service: General;  Laterality: N/A;  . Transthoracic echocardiogram  03/20/2015    Normal LV size and function. EF 60-65%. G1 DD. Trivial AI. Borderline aortic root dilation  (41 mm), Mild LA dilation.  Marland Kitchen Nm myoview ltd  03/19/2015    FALSE POSITIVE:  INTERMEDIATE RISK. Small sized, moderate intensity inferior ischemic perfusion defect  . Left heart catheterization with coronary angiogram N/A 04/10/2015    Procedure: LEFT HEART CATHETERIZATION WITH  CORONARY ANGIOGRAM;  Surgeon: Leonie Man, MD;  Location: [facility] CATH LAB;  Service: Cardiovascular;  Angiographicallly NORMAL CORONARY ARTERIES    There were no vitals filed for this visit.  Visit Diagnosis:  Lymphedema of arm      Subjective Assessment - 11/03/15 1434    Subjective Got measured for the sleeve Friday.  Hand looks good today, but forearm is swollen.   Currently in Pain? No/denies                         [facility] Adult PT Treatment/Exercise - 11/03/15 0001    Self-Care   Other Self-Care Comments  Discussed general guideline to replace compression garments every six months; discussed the possibility that he could get off the shelf (mid-level stiffness) garments to be used opposite his custom garment, at less expense; discussed National Lymphedema Editor, commissioning for financial assistance with garment cost   Manual Therapy   Manual Lymphatic Drainage (MLD) short neck, superficial and deep abdomen, left axilla and upper quadrant and anterior interaxillary anastomosis, right groin and axillo-inguinal anastomosis, and right UE, including elbow, from fingers to  shoulder.   Compression Bandaging Bandaging with thick  stockinette, Elastomull x 1 on all five fingers,with thick foam piece on back of hand.  Artiflex x2 over lined peach foam at forearm and 1-6 cm., 1-10 cm., and 2-12 cm. bandages from hand to shoulder on right with herringbone at forearm                    Short Term Clinic Goals - 10/15/15 1556    CC Short Term Goal  #2   Title Right arm circumference at 15 cm. proximal to ulnar styloid reduced to 33 cm.  32.9 cm reduction attained today 10/15/15   Status  Achieved             Long Term Clinic Goals - 10/29/15 1523    CC Long Term Goal  #1   Title Right arm circumference at 15 cm. proximal to ulnar styloid reduced to 31 cm.  33.1 today 10/29/15   CC Long Term Goal  #3   Title Pt. will be knowledgeable about how and where to obtain daytime compression sleeve and glove, and nighttime garment if he wants one  Gets measured Friday11/4/16   Status Achieved   CC Long Term Goal  #4   Title Pt. will be knowledgeable about lymphedema risk reduction practices   Status Achieved            Plan - 11/03/15 1621    Clinical Impression Statement Although measurements weren't taken today, forearm, particularly anterior aspect, appeared fuller today than last week.  Continues to be compliant with wearing garments.  Answered patient's questions about garments needing to be replaced about every six months.   Pt will benefit from skilled therapeutic intervention in order to improve on the following deficits Increased edema;Decreased knowledge of precautions;Decreased knowledge of use of DME   Rehab Potential Excellent   PT Frequency 3x / week   PT Duration 6 weeks   PT Treatment/Interventions Manual lymph drainage;Compression bandaging;ADLs/Self Care Home Management   PT Next Visit Plan Remeasure.  Check and address goals.  Continue complete decongestive therapy.   Consulted and Agree with Plan of Care Patient        Problem List Patient Active Problem List   Diagnosis Date Noted  . Lymphedema 09/21/2015  . Encounter for immunization 09/21/2015  . Otitis, externa, infective 08/10/2015  . Abnormal exercise myocardial perfusion study 04/02/2015  . Medicare annual wellness visit, subsequent 03/16/2015  . Paroxysmal atrial fibrillation - auto rate control 03/16/2015  . Hypertriglyceridemia 03/16/2015  . Bradycardia 12/30/2014  . Melanoma of back ([place])   . Mass of right chest wall 05/16/2014  . Mass of chest wall, right subfacial, 12cm s/p  removal w rib resection 05/16/2014 03/08/2014  . Mass of posterior neck, SQ 4cm 03/08/2014  . Chest wall mass - right SQ anterior 5cm 08/21/2013  . Post herpetic neuralgia 07/09/2013  . Blepharitis of right lower eyelid 08/03/2012  . OSA on CPAP 07/30/2011  . ELEVATED PROSTATE SPECIFIC ANTIGEN 01/13/2010  . LIPOMA, SKIN 08/29/2008  . GERD 08/29/2008  . BENIGN PROSTATIC HYPERTROPHY 08/29/2008  . SHOULDER PAIN, LEFT 08/29/2008  . ELEVATED BP READING WITHOUT DX HYPERTENSION 08/29/2008    [name] [name] 11/03/2015, 4:24 PM  [place], [place], [address] Phone: [phone]   Fax:  [phone]  Name: [name] [name] MRN: [id number] Date of Birth: August 12, 1941    [name] [name], PT 11/03/2015 4:24 PM

## 2015-11-05 ENCOUNTER — Ambulatory Visit: Payer: Medicare Other | Admitting: Physical Therapy

## 2015-11-05 DIAGNOSIS — I890 Lymphedema, not elsewhere classified: Secondary | ICD-10-CM | POA: Diagnosis not present

## 2015-11-05 NOTE — Therapy (Signed)
[place], [place], [address] Phone: [phone]   Fax:  [phone]  Physical Therapy Treatment  Patient Details  Name: [name] [name] MRN: [id number] Date of Birth: April 07, 1941 Referring Provider: Dr. Willette Alma  Encounter Date: 11/05/2015      PT End of Session - 11/05/15 1719    Visit Number 16   Number of Visits 25   Date for PT Re-Evaluation 11/26/15   PT Start Time 5643   PT Stop Time 1524   PT Time Calculation (min) 59 min   Activity Tolerance Patient tolerated treatment well   Behavior During Therapy [facility] for tasks assessed/performed      Past Medical History  Diagnosis Date  . GERD (gastroesophageal reflux disease)     controlled w/ diet and behavioral changes  . BPH (benign prostatic hypertrophy)   . Elevated PSA   . Shingles 07/09/2013  . Encounter for Medicare annual wellness exam 09/22/2013    Sees Dr Wilhemina Bonito of Derm Sees Dr Silvano Rusk of Gastroenterology Sees Dr Kathie Rhodes of [facility] Sees Dr Susa Day of Optometry  . Snoring disorder 07/30/2011  . Sleep apnea 07/30/2011    cpap- 13   . Bradycardia 12/30/2014    HR routinely in mid 40s-50s  . New onset a-fib ([place]) 03/10/2015  . Hypertriglyceridemia 03/16/2015  . White coat hypertension   . Melanoma of back ([place])     Excised Dr Wilhemina Bonito  . Coronary artery disease (CAD) excluded April 2016    False-positive nuclear stress test suggesting inferior ischemia  . Otitis, externa, infective 08/10/2015    Past Surgical History  Procedure Laterality Date  . Mass excision N/A 05/16/2014    Procedure: EXCISION POSTERIOR NECK MASS, RIGHT CHEST WALL MASS AND RIGHT AXILLARY MASS AXILLARY NODE DISSECTION;  Surgeon: Adin Hector, MD;  Location: [facility];  Service: General;  Laterality: N/A;  . Transthoracic echocardiogram  03/20/2015    Normal LV size and function. EF 60-65%. G1 DD. Trivial AI. Borderline aortic root dilation  (41 mm), Mild LA dilation.  Marland Kitchen Nm myoview ltd  03/19/2015    FALSE POSITIVE:  INTERMEDIATE RISK. Small sized, moderate intensity inferior ischemic perfusion defect  . Left heart catheterization with coronary angiogram N/A 04/10/2015    Procedure: LEFT HEART CATHETERIZATION WITH  CORONARY ANGIOGRAM;  Surgeon: Leonie Man, MD;  Location: [facility] CATH LAB;  Service: Cardiovascular;  Angiographicallly NORMAL CORONARY ARTERIES    There were no vitals filed for this visit.  Visit Diagnosis:  Lymphedema of arm      Subjective Assessment - 11/05/15 1426    Subjective Nothing new.  Just the same old pains.               LYMPHEDEMA/ONCOLOGY QUESTIONNAIRE - 11/05/15 1426    Right Upper Extremity Lymphedema   15 cm Proximal to Olecranon Process 33.5 cm   10 cm Proximal to Olecranon Process 31.3 cm   Olecranon Process 30.1 cm   15 cm Proximal to Ulnar Styloid Process 30.3 cm   10 cm Proximal to Ulnar Styloid Process 28.6 cm   Just Proximal to Ulnar Styloid Process 22.8 cm   Across Hand at [employer] 22.6 cm   At [place] of 2nd Digit 7.9 cm                  [facility] Adult PT Treatment/Exercise - 11/05/15 0001    Self-Care   Other Self-Care Comments  showed patient a sample of an [place] and explained how a Comfort is different; gave information about pumps including Flexitouch pump and discussed that   Manual Therapy   Manual Therapy Edema management   Edema Management circumference measurements taken   Manual Lymphatic Drainage (MLD) short neck, superficial and deep abdomen, left axilla and upper quadrant and anterior interaxillary anastomosis, right groin and axillo-inguinal anastomosis, and right UE, including elbow, from fingers to shoulder.   Compression Bandaging Bandaging with thick  stockinette, Elastomull x 1 on all five fingers,with thick foam piece on back of hand and thenar aspect of wrist.  Artiflex x2 over lined peach foam at forearm and 1-6 cm., 1-10 cm., 1 additional  6 cm. bandage at forearm in herringbone pattern, and 2-12 cm. bandages from hand to shoulder on right.                   Short Term Clinic Goals - 10/15/15 1556    CC Short Term Goal  #2   Title Right arm circumference at 15 cm. proximal to ulnar styloid reduced to 33 cm.  32.9 cm reduction attained today 10/15/15   Status Achieved             Long Term Clinic Goals - 11/05/15 2023    CC Long Term Goal  #1   Title Right arm circumference at 15 cm. proximal to ulnar styloid reduced to 31 cm.   Baseline 35.7 cm. compared to 27.9 on left at time of eval; 30.3 cm. on 11/05/15   Status Achieved            Plan - 11/05/15 1720    Clinical Impression Statement Excellent reductions in circumference measurements today.  Tissue feel softer too.   Pt will benefit from skilled therapeutic intervention in order to improve on the following deficits Increased edema;Decreased knowledge of precautions;Decreased knowledge of use of DME   Rehab Potential Excellent   PT Frequency 3x / week   PT Duration 6 weeks   PT Treatment/Interventions Manual lymph drainage;Compression bandaging;ADLs/Self Care Home Management   PT Next Visit Plan Continue complete decongestive therapy.   Consulted and Agree with Plan of Care Patient        Problem List Patient Active Problem List   Diagnosis Date Noted  . Lymphedema 09/21/2015  . Encounter for immunization 09/21/2015  . Otitis, externa, infective 08/10/2015  . Abnormal exercise myocardial perfusion study 04/02/2015  . Medicare annual wellness visit, subsequent 03/16/2015  . Paroxysmal atrial fibrillation - auto rate control 03/16/2015  . Hypertriglyceridemia 03/16/2015  . Bradycardia 12/30/2014  . Melanoma of back ([place])   . Mass of right chest wall 05/16/2014  . Mass of chest wall, right subfacial, 12cm s/p removal w rib resection 05/16/2014 03/08/2014  . Mass of posterior neck, SQ 4cm 03/08/2014  . Chest wall mass - right SQ  anterior 5cm 08/21/2013  . Post herpetic neuralgia 07/09/2013  . Blepharitis of right lower eyelid 08/03/2012  . OSA on CPAP 07/30/2011  . ELEVATED PROSTATE SPECIFIC ANTIGEN 01/13/2010  . LIPOMA, SKIN 08/29/2008  . GERD 08/29/2008  . BENIGN PROSTATIC HYPERTROPHY 08/29/2008  . SHOULDER PAIN, LEFT 08/29/2008  . ELEVATED BP READING WITHOUT DX HYPERTENSION 08/29/2008    Lynnea Vandervoort 11/05/2015, 8:26 PM  [place], [place], [address] Phone: [phone]   Fax:  [phone]  Name: [name] [name] MRN: [id number] Date of Birth: [birth year]    Serafina Royals, PT 11/05/2015 8:26  PM

## 2015-11-07 ENCOUNTER — Ambulatory Visit: Payer: Medicare Other | Admitting: Physical Therapy

## 2015-11-07 DIAGNOSIS — I890 Lymphedema, not elsewhere classified: Secondary | ICD-10-CM

## 2015-11-07 NOTE — Therapy (Signed)
[place], [place], [address] Phone: [phone]   Fax:  [phone]  Physical Therapy Treatment  Patient Details  Name: [name] [name] MRN: [id number] Date of Birth: [birth year] Referring Provider: Dr. Willette Alma  Encounter Date: 11/07/2015      PT End of Session - 11/07/15 1157    Visit Number 17   Number of Visits 25   Date for PT Re-Evaluation 11/26/15   PT Start Time 1104   PT Stop Time 1145   PT Time Calculation (min) 41 min   Activity Tolerance Patient tolerated treatment well   Behavior During Therapy [facility] for tasks assessed/performed      Past Medical History  Diagnosis Date  . GERD (gastroesophageal reflux disease)     controlled w/ diet and behavioral changes  . BPH (benign prostatic hypertrophy)   . Elevated PSA   . Shingles 07/09/2013  . Encounter for Medicare annual wellness exam 09/22/2013    Sees Dr Wilhemina Bonito of Derm Sees Dr Silvano Rusk of Gastroenterology Sees Dr Kathie Rhodes of [facility] Sees Dr Susa Day of Optometry  . Snoring disorder 07/30/2011  . Sleep apnea 07/30/2011    cpap- 13   . Bradycardia 12/30/2014    HR routinely in mid 40s-50s  . New onset a-fib ([place]) 03/10/2015  . Hypertriglyceridemia 03/16/2015  . White coat hypertension   . Melanoma of back ([place])     Excised Dr Wilhemina Bonito  . Coronary artery disease (CAD) excluded April 2016    False-positive nuclear stress test suggesting inferior ischemia  . Otitis, externa, infective 08/10/2015    Past Surgical History  Procedure Laterality Date  . Mass excision N/A 05/16/2014    Procedure: EXCISION POSTERIOR NECK MASS, RIGHT CHEST WALL MASS AND RIGHT AXILLARY MASS AXILLARY NODE DISSECTION;  Surgeon: Adin Hector, MD;  Location: [facility];  Service: General;  Laterality: N/A;  . Transthoracic echocardiogram  03/20/2015    Normal LV size and function. EF 60-65%. G1 DD. Trivial AI. Borderline aortic root dilation  (41 mm), Mild LA dilation.  Marland Kitchen Nm myoview ltd  03/19/2015    FALSE POSITIVE:  INTERMEDIATE RISK. Small sized, moderate intensity inferior ischemic perfusion defect  . Left heart catheterization with coronary angiogram N/A 04/10/2015    Procedure: LEFT HEART CATHETERIZATION WITH  CORONARY ANGIOGRAM;  Surgeon: Leonie Man, MD;  Location: [facility] CATH LAB;  Service: Cardiovascular;  Angiographicallly NORMAL CORONARY ARTERIES    There were no vitals filed for this visit.  Visit Diagnosis:  Lymphedema of arm      Subjective Assessment - 11/07/15 1107    Subjective Its doing good!  Pt is interested in finding out more about the basic compression pump for his arm as he does not know if he can afford the nigthtime garment at this time    Pertinent History Melanoma on mid back with no nodes removed; they told him they had good margins; no other treatment.  That was about two years ago.  Left shoulder joint needs to be replaced if it bothers him bad enough.  Recently diagnosed with  atrial fibrillation, but says he stopped drinking caffeine and that resolved it.   Had a lipoma removed a year ago or so, and that was in the area of the right axilla. He has ALND at that time  Currently in Pain? No/denies                         [facility] Adult PT Treatment/Exercise - 11/07/15 0001    Manual Therapy   Manual Lymphatic Drainage (MLD) short neck, superficial and deep abdomen, left axilla and upper quadrant and anterior interaxillary anastomosis, right groin and axillo-inguinal anastomosis, and right UE, including elbow, from fingers to shoulder.   Compression Bandaging Bandaging with thick  stockinette, Elastomull x 1 on all five fingers,with thick foam piece on back of hand and thenar aspect of wrist.  Artiflex x2 over lined peach foam at forearm and 1-6 cm., 1-10 cm., 1 additional 6 cm. bandage at forearm in herringbone pattern, and 2-12 cm.  bandages from hand to shoulder on right.                   Short Term Clinic Goals - 10/15/15 1556    CC Short Term Goal  #2   Title Right arm circumference at 15 cm. proximal to ulnar styloid reduced to 33 cm.  32.9 cm reduction attained today 10/15/15   Status Achieved             Long Term Clinic Goals - 11/05/15 2023    CC Long Term Goal  #1   Title Right arm circumference at 15 cm. proximal to ulnar styloid reduced to 31 cm.   Baseline 35.7 cm. compared to 27.9 on left at time of eval; 30.3 cm. on 11/05/15   Status Achieved            Plan - 11/07/15 1158    Clinical Impression Statement Pt continues good response to compression wrapping but is concerned about being able afford nightime gament . Feel he would benefit from the basic compression pump.  Per his request, contacted Hoyle Sauer to check on insurance coverage for this . She reported she would contact the patient.   PT Next Visit Plan Continue complete decongestive therapy.        Problem List Patient Active Problem List   Diagnosis Date Noted  . Lymphedema 09/21/2015  . Encounter for immunization 09/21/2015  . Otitis, externa, infective 08/10/2015  . Abnormal exercise myocardial perfusion study 04/02/2015  . Medicare annual wellness visit, subsequent 03/16/2015  . Paroxysmal atrial fibrillation - auto rate control 03/16/2015  . Hypertriglyceridemia 03/16/2015  . Bradycardia 12/30/2014  . Melanoma of back ([place])   . Mass of right chest wall 05/16/2014  . Mass of chest wall, right subfacial, 12cm s/p removal w rib resection 05/16/2014 03/08/2014  . Mass of posterior neck, SQ 4cm 03/08/2014  . Chest wall mass - right SQ anterior 5cm 08/21/2013  . Post herpetic neuralgia 07/09/2013  . Blepharitis of right lower eyelid 08/03/2012  . OSA on CPAP 07/30/2011  . ELEVATED PROSTATE SPECIFIC ANTIGEN 01/13/2010  . LIPOMA, SKIN 08/29/2008  . GERD 08/29/2008  . BENIGN PROSTATIC HYPERTROPHY 08/29/2008  .  SHOULDER PAIN, LEFT 08/29/2008  . ELEVATED BP READING WITHOUT DX HYPERTENSION 08/29/2008   Donato Heinz. Owens Shark, PT  11/07/2015, 12:01 PM  [place], [place], [address] Phone: [phone]   Fax:  [phone]  Name: CEON GRISHABER MRN: [id number] Date of Birth: 08-07-1941

## 2015-11-09 NOTE — Assessment & Plan Note (Signed)
Wearing compression hose on RUE, does find it helpful. Is awaiting permanent sleeve.

## 2015-11-09 NOTE — Assessment & Plan Note (Signed)
RRR today 

## 2015-11-09 NOTE — Assessment & Plan Note (Signed)
Avoid offending foods, start probiotics. Do not eat large meals in late evening and consider raising head of bed.  

## 2015-11-09 NOTE — Assessment & Plan Note (Signed)
Well controlled. Encouraged heart healthy diet such as the DASH diet and exercise as tolerated.  

## 2015-11-09 NOTE — Progress Notes (Signed)
Subjective:    Patient ID: [name] [name], male    DOB: [birth year], 74 y.o.   MRN: [id number]  Chief Complaint  Patient presents with  . Follow-up    HPI Patient is in today for follow-up. He is now well established with lymphedema clinic and is wearing a temper a compression sleeve today. He finds it helpful and is getting his permanent 1 soon. No recent illness. Continues to have right-sided upper back pain and mild radicular symptoms but they are manageable. No recent illness or acute concerns. ,Denies CP/palp/SOB/HA/congestion/fevers/GI or GU c/o. Taking meds as prescribed  Past Medical History  Diagnosis Date  . GERD (gastroesophageal reflux disease)     controlled w/ diet and behavioral changes  . BPH (benign prostatic hypertrophy)   . Elevated PSA   . Shingles 07/09/2013  . Encounter for Medicare annual wellness exam 09/22/2013    Sees Dr Wilhemina Bonito of Derm Sees Dr Silvano Rusk of Gastroenterology Sees Dr Kathie Rhodes of [facility] Sees Dr Susa Day of Optometry  . Snoring disorder 07/30/2011  . Sleep apnea 07/30/2011    cpap- 13   . Bradycardia 12/30/2014    HR routinely in mid 40s-50s  . New onset a-fib ([place]) 03/10/2015  . Hypertriglyceridemia 03/16/2015  . White coat hypertension   . Melanoma of back ([place])     Excised Dr Wilhemina Bonito  . Coronary artery disease (CAD) excluded April 2016    False-positive nuclear stress test suggesting inferior ischemia  . Otitis, externa, infective 08/10/2015    Past Surgical History  Procedure Laterality Date  . Mass excision N/A 05/16/2014    Procedure: EXCISION POSTERIOR NECK MASS, RIGHT CHEST WALL MASS AND RIGHT AXILLARY MASS AXILLARY NODE DISSECTION;  Surgeon: Adin Hector, MD;  Location: [facility];  Service: General;  Laterality: N/A;  . Transthoracic echocardiogram  03/20/2015    Normal LV size and function. EF 60-65%. G1 DD. Trivial AI. Borderline aortic root dilation (41 mm), Mild LA dilation.  Marland Kitchen Nm myoview ltd  03/19/2015      FALSE POSITIVE:  INTERMEDIATE RISK. Small sized, moderate intensity inferior ischemic perfusion defect  . Left heart catheterization with coronary angiogram N/A 04/10/2015    Procedure: LEFT HEART CATHETERIZATION WITH  CORONARY ANGIOGRAM;  Surgeon: Leonie Man, MD;  Location: [facility] CATH LAB;  Service: Cardiovascular;  Angiographicallly NORMAL CORONARY ARTERIES    Family History  Problem Relation Age of Onset  . Cancer Mother     MM, leukemia  . Emphysema Father   . Cancer Sister     brain  . Alcohol abuse Other   . COPD Brother   . Heart disease Paternal Grandmother   . COPD Paternal Grandfather   . Diabetes Sister   . Obesity Sister   . Down syndrome Brother   . Colon cancer Neg Hx     Social History   Social History  . Marital Status: Married    Spouse Name: [name] [name]  . Number of Children: 2  . Years of Education: N/A   Occupational History  . retired     Administrator   Social History Main Topics  . Smoking status: Former Smoker    Quit date: 02/11/1989  . Smokeless tobacco: Never Used  . Alcohol Use: 4.2 oz/week    7 Glasses of wine per week     Comment: 1 per day -- brandy or wine  . Drug Use: No  . Sexual Activity: Not on file  Comment: lives with wife, retired from [employer] driving, no dietary restrictions   Other Topics Concern  . Not on file   Social History Narrative   He is a married father of 2, and grandfather of 38. He has been married to his wife [name] [name] for 51 years. He previously lived in [place] but has moved with his wife to [place], [place] to be close to his daughter [name] [name] and her family. 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 He quit smoking in 1990. He has 6-7 glasses of brandy or wine a week.    He exercises regularly with low impact aerobics 2 days a week and daily walks of 30-45 minutes. His exercise sessions or 60 minutes. He will do some type of  exercise at least 7 days a week and is very active doing yard work and wood work.    Outpatient Prescriptions Prior to Visit  Medication Sig Dispense Refill  . aspirin 81 MG chewable tablet Chew 81 mg by mouth every Monday, Wednesday, and Friday.     . Cholecalciferol (VITAMIN D) 2000 UNITS CAPS Take 1 capsule by mouth daily.    Marland Kitchen KRILL OIL PO Take 1 capsule by mouth daily.    . Multiple Vitamin (MULTIVITAMIN WITH MINERALS) TABS tablet Take 1 tablet by mouth daily.    Marland Kitchen gabapentin (NEURONTIN) 300 MG capsule Take 3 capsules (900 mg total) by mouth 3 (three) times daily. 270 capsule 2  . diazepam (VALIUM) 5 MG tablet Take 0.5-2 tablets (2.5-10 mg total) by mouth every evening. As needed (Patient not taking: Reported on 10/31/2015) 30 tablet 1  . cephALEXin (KEFLEX) 500 MG capsule Take 1 capsule (500 mg total) by mouth 3 (three) times daily. (Patient not taking: Reported on 09/24/2015) 30 capsule 0   No facility-administered medications prior to visit.    No Known Allergies  Review of Systems  Constitutional: Negative for fever and malaise/fatigue.  HENT: Negative for congestion.   Eyes: Negative for discharge.  Respiratory: Negative for shortness of breath.   Cardiovascular: Negative for chest pain, palpitations and leg swelling.  Gastrointestinal: Negative for nausea and abdominal pain.  Genitourinary: Negative for dysuria.  Musculoskeletal: Negative for falls.  Skin: Negative for rash.  Neurological: Negative for loss of consciousness and headaches.  Endo/Heme/Allergies: Negative for environmental allergies.  Psychiatric/Behavioral: Negative for depression. The patient is not nervous/anxious.        Objective:    Physical Exam  Constitutional: He is oriented to person, place, and time. He appears well-developed and well-nourished. No distress.  HENT:  Head: Normocephalic and atraumatic.  Right Ear: External ear normal.  Left Ear: External ear normal.  Nose: Nose normal.    Mouth/Throat: Oropharynx is clear and moist. No oropharyngeal exudate.  Eyes: Conjunctivae and EOM are normal. Pupils are equal, round, and reactive to light. Right eye exhibits no discharge. Left eye exhibits no discharge.  Neck: Normal range of motion. Neck supple. No JVD present. No thyromegaly present.  Cardiovascular: Normal rate, regular rhythm and intact distal pulses.   No murmur heard. Pulmonary/Chest: Effort normal and breath sounds normal. No respiratory distress. He has no wheezes. He has no rales. He exhibits no tenderness.  Abdominal: Soft. Bowel sounds are normal. He exhibits no distension and no mass. There is no tenderness. There is no rebound and no guarding.  Genitourinary: Prostate normal and penis normal.  Musculoskeletal: Normal range of motion.  He exhibits no edema or tenderness.  Lymphadenopathy:    He has no cervical adenopathy.  Neurological: He is alert and oriented to person, place, and time. He displays normal reflexes. No cranial nerve deficit. He exhibits normal muscle tone.  Skin: Skin is warm and dry. No rash noted. He is not diaphoretic. No erythema.  Psychiatric: He has a normal mood and affect. His behavior is normal. Judgment and thought content normal.    Pulse 89  Temp(Src) 98 F (36.7 C) (Oral)  Ht 5\' 10"  (1.778 m)  Wt 236 lb 2 oz (107.106 kg)  BMI 33.88 kg/m2  SpO2 98% Wt Readings from Last 3 Encounters:  10/31/15 236 lb 2 oz (107.106 kg)  09/08/15 237 lb 3.2 oz (107.593 kg)  07/29/15 235 lb 6 oz (106.765 kg)     Lab Results  Component Value Date   WBC 7.9 09/08/2015   HGB 13.6 09/08/2015   HCT 41.7 09/08/2015   PLT 201.0 09/08/2015   GLUCOSE 89 09/08/2015   CHOL 133 07/22/2015   TRIG 67.0 07/22/2015   HDL 44.70 07/22/2015   LDLCALC 75 07/22/2015   ALT 40 09/08/2015   AST 28 09/08/2015   NA 142 09/08/2015   K 4.6 09/08/2015   CL 108 09/08/2015   CREATININE 1.04 09/08/2015   BUN 28* 09/08/2015   CO2 28 09/08/2015   TSH 1.99  07/22/2015   PSA 4.94* 01/15/2010   INR 1.22 04/04/2015    Lab Results  Component Value Date   TSH 1.99 07/22/2015   Lab Results  Component Value Date   WBC 7.9 09/08/2015   HGB 13.6 09/08/2015   HCT 41.7 09/08/2015   MCV 87.9 09/08/2015   PLT 201.0 09/08/2015   Lab Results  Component Value Date   NA 142 09/08/2015   K 4.6 09/08/2015   CO2 28 09/08/2015   GLUCOSE 89 09/08/2015   BUN 28* 09/08/2015   CREATININE 1.04 09/08/2015   BILITOT 0.6 09/08/2015   ALKPHOS 51 09/08/2015   AST 28 09/08/2015   ALT 40 09/08/2015   PROT 6.6 09/08/2015   ALBUMIN 3.9 09/08/2015   CALCIUM 9.0 09/08/2015   GFR 74.47 09/08/2015   Lab Results  Component Value Date   CHOL 133 07/22/2015   Lab Results  Component Value Date   HDL 44.70 07/22/2015   Lab Results  Component Value Date   LDLCALC 75 07/22/2015   Lab Results  Component Value Date   TRIG 67.0 07/22/2015   Lab Results  Component Value Date   CHOLHDL 3 07/22/2015   No results found for: HGBA1C     Assessment & Plan:   Problem List Items Addressed This Visit    GERD    Avoid offending foods, start probiotics. Do not eat large meals in late evening and consider raising head of bed.       ELEVATED BP READING WITHOUT DX HYPERTENSION    Well controlled. Encouraged heart healthy diet such as the DASH diet and exercise as tolerated.       Lymphedema    Wearing compression hose on RUE, does find it helpful. Is awaiting permanent sleeve.      Bradycardia (Chronic)    RRR today       Other Visit Diagnoses    Need for 23-polyvalent pneumococcal polysaccharide vaccine    -  Primary    Relevant Orders    Pneumococcal polysaccharide vaccine 23-valent greater than or equal to 2yo subcutaneous/IM (Completed)  I have discontinued Mr. Facemire's cephALEXin. I am also having him maintain his aspirin, multivitamin with minerals, KRILL OIL PO, Vitamin D, diazepam, and gabapentin.  Meds ordered this encounter    Medications  . gabapentin (NEURONTIN) 300 MG capsule    Sig: Take 3 capsules (900 mg total) by mouth 3 (three) times daily.    Dispense:  270 capsule    Refill:  2    D/C PREVIOUS SCRIPTS FOR THIS MEDICATION     [name], MD

## 2015-11-10 ENCOUNTER — Ambulatory Visit: Payer: Medicare Other | Admitting: Physical Therapy

## 2015-11-10 DIAGNOSIS — I890 Lymphedema, not elsewhere classified: Secondary | ICD-10-CM

## 2015-11-10 NOTE — Therapy (Signed)
[place], [place], [address] Phone: [phone]   Fax:  [phone]  Physical Therapy Treatment  Patient Details  Name: [name] [name] MRN: [id number] Date of Birth: 09/01/1941 Referring Provider: Dr. Willette Alma  Encounter Date: 11/10/2015      PT End of Session - 11/10/15 1508    Visit Number 18   Number of Visits 25   Date for PT Re-Evaluation 11/26/15   PT Start Time 1410   PT Stop Time 1504   PT Time Calculation (min) 54 min   Activity Tolerance Patient tolerated treatment well   Behavior During Therapy [facility] for tasks assessed/performed      Past Medical History  Diagnosis Date  . GERD (gastroesophageal reflux disease)     controlled w/ diet and behavioral changes  . BPH (benign prostatic hypertrophy)   . Elevated PSA   . Shingles 07/09/2013  . Encounter for Medicare annual wellness exam 09/22/2013    Sees Dr Wilhemina Bonito of Derm Sees Dr Silvano Rusk of Gastroenterology Sees Dr Kathie Rhodes of [facility] Sees Dr Susa Day of Optometry  . Snoring disorder 07/30/2011  . Sleep apnea 07/30/2011    cpap- 13   . Bradycardia 12/30/2014    HR routinely in mid 40s-50s  . New onset a-fib ([place]) 03/10/2015  . Hypertriglyceridemia 03/16/2015  . White coat hypertension   . Melanoma of back ([place])     Excised Dr Wilhemina Bonito  . Coronary artery disease (CAD) excluded April 2016    False-positive nuclear stress test suggesting inferior ischemia  . Otitis, externa, infective 08/10/2015    Past Surgical History  Procedure Laterality Date  . Mass excision N/A 05/16/2014    Procedure: EXCISION POSTERIOR NECK MASS, RIGHT CHEST WALL MASS AND RIGHT AXILLARY MASS AXILLARY NODE DISSECTION;  Surgeon: Adin Hector, MD;  Location: [facility];  Service: General;  Laterality: N/A;  . Transthoracic echocardiogram  03/20/2015    Normal LV size and function. EF 60-65%. G1 DD. Trivial AI. Borderline aortic root dilation  (41 mm), Mild LA dilation.  Marland Kitchen Nm myoview ltd  03/19/2015    FALSE POSITIVE:  INTERMEDIATE RISK. Small sized, moderate intensity inferior ischemic perfusion defect  . Left heart catheterization with coronary angiogram N/A 04/10/2015    Procedure: LEFT HEART CATHETERIZATION WITH  CORONARY ANGIOGRAM;  Surgeon: Leonie Man, MD;  Location: [facility] CATH LAB;  Service: Cardiovascular;  Angiographicallly NORMAL CORONARY ARTERIES    There were no vitals filed for this visit.  Visit Diagnosis:  Lymphedema of arm      Subjective Assessment - 11/10/15 1411    Subjective Nothing new over the weekend.   Currently in Pain? No/denies                         [facility] Adult PT Treatment/Exercise - 11/10/15 0001    Manual Therapy   Manual Lymphatic Drainage (MLD) short neck, superficial and deep abdomen, left axilla and upper quadrant and anterior interaxillary anastomosis, right groin and axillo-inguinal anastomosis, and right UE, including elbow, from fingers to shoulder.   Compression Bandaging Bandaging with thick  stockinette, Elastomull x 1 on first four fingers, with thick foam piece on back of hand extending proximal to wrist and a piece at thenar aspect of wrist.  Artiflex x 2 over lined peach foam at forearm and 1-6 cm., 1-10 cm. and 2-12 cm. bandages from hand to shoulder on right with second  to last one in herringbone pattern on forearm.                   Short Term Clinic Goals - 10/15/15 1556    CC Short Term Goal  #2   Title Right arm circumference at 15 cm. proximal to ulnar styloid reduced to 33 cm.  32.9 cm reduction attained today 10/15/15   Status Achieved             Long Term Clinic Goals - 11/05/15 2023    CC Long Term Goal  #1   Title Right arm circumference at 15 cm. proximal to ulnar styloid reduced to 31 cm.   Baseline 35.7 cm. compared to 27.9 on left at time of eval; 30.3 cm. on 11/05/15   Status Achieved            Plan - 11/10/15  1509    Clinical Impression Statement Patient's arm looked good again today, but still with redness at thenar aspect of wrist, so foam added at dorsal wrist to make the area rounder and therefore to decrease pressure at thenar aspect of wrist.  Patient is hopeful that his compression garments come before Thanksgiving, and certainly before December 1st, when they leave on a trip.   Pt will benefit from skilled therapeutic intervention in order to improve on the following deficits Increased edema;Decreased knowledge of precautions;Decreased knowledge of use of DME   Rehab Potential Excellent   PT Frequency 3x / week   PT Duration 6 weeks   PT Treatment/Interventions Manual lymph drainage;Compression bandaging;ADLs/Self Care Home Management   PT Next Visit Plan Remeasure.  Continue complete decongestive therapy.   Consulted and Agree with Plan of Care Patient        Problem List Patient Active Problem List   Diagnosis Date Noted  . Lymphedema 09/21/2015  . Encounter for immunization 09/21/2015  . Otitis, externa, infective 08/10/2015  . Abnormal exercise myocardial perfusion study 04/02/2015  . Medicare annual wellness visit, subsequent 03/16/2015  . Paroxysmal atrial fibrillation - auto rate control 03/16/2015  . Hypertriglyceridemia 03/16/2015  . Bradycardia 12/30/2014  . Melanoma of back ([place])   . Mass of right chest wall 05/16/2014  . Mass of chest wall, right subfacial, 12cm s/p removal w rib resection 05/16/2014 03/08/2014  . Mass of posterior neck, SQ 4cm 03/08/2014  . Chest wall mass - right SQ anterior 5cm 08/21/2013  . Post herpetic neuralgia 07/09/2013  . Blepharitis of right lower eyelid 08/03/2012  . OSA on CPAP 07/30/2011  . ELEVATED PROSTATE SPECIFIC ANTIGEN 01/13/2010  . LIPOMA, SKIN 08/29/2008  . GERD 08/29/2008  . BENIGN PROSTATIC HYPERTROPHY 08/29/2008  . SHOULDER PAIN, LEFT 08/29/2008  . ELEVATED BP READING WITHOUT DX HYPERTENSION 08/29/2008     SALISBURY,DONNA 11/10/2015, 3:11 PM  [place], [place], [address] Phone: [phone]   Fax:  [phone]  Name: KASAAN SPAMPINATO MRN: [id number] Date of Birth: 1941-12-08    Serafina Royals, PT 11/10/2015 3:11 PM

## 2015-11-12 ENCOUNTER — Ambulatory Visit: Payer: Medicare Other

## 2015-11-12 DIAGNOSIS — I890 Lymphedema, not elsewhere classified: Secondary | ICD-10-CM | POA: Diagnosis not present

## 2015-11-12 NOTE — Therapy (Addendum)
[place], [place], [address] Phone: [phone]   Fax:  [phone]  Physical Therapy Treatment  Patient Details  Name: [name] [name] MRN: [id number] Date of Birth: 01-22-1941 Referring Provider: Dr. Willette Alma  Encounter Date: 11/12/2015      PT End of Session - 11/12/15 1200    Visit Number 93  KX modifier   Number of Visits 25   Date for PT Re-Evaluation 11/26/15   PT Start Time 1103   PT Stop Time 1158   PT Time Calculation (min) 55 min   Activity Tolerance Patient tolerated treatment well   Behavior During Therapy [facility] for tasks assessed/performed      Past Medical History  Diagnosis Date  . GERD (gastroesophageal reflux disease)     controlled w/ diet and behavioral changes  . BPH (benign prostatic hypertrophy)   . Elevated PSA   . Shingles 07/09/2013  . Encounter for Medicare annual wellness exam 09/22/2013    Sees Dr Wilhemina Bonito of Derm Sees Dr Silvano Rusk of Gastroenterology Sees Dr Kathie Rhodes of [facility] Sees Dr Susa Day of Optometry  . Snoring disorder 07/30/2011  . Sleep apnea 07/30/2011    cpap- 13   . Bradycardia 12/30/2014    HR routinely in mid 40s-50s  . New onset a-fib ([place]) 03/10/2015  . Hypertriglyceridemia 03/16/2015  . White coat hypertension   . Melanoma of back ([place])     Excised Dr Wilhemina Bonito  . Coronary artery disease (CAD) excluded April 2016    False-positive nuclear stress test suggesting inferior ischemia  . Otitis, externa, infective 08/10/2015    Past Surgical History  Procedure Laterality Date  . Mass excision N/A 05/16/2014    Procedure: EXCISION POSTERIOR NECK MASS, RIGHT CHEST WALL MASS AND RIGHT AXILLARY MASS AXILLARY NODE DISSECTION;  Surgeon: Adin Hector, MD;  Location: [facility];  Service: General;  Laterality: N/A;  . Transthoracic echocardiogram  03/20/2015    Normal LV size and function. EF 60-65%. G1 DD. Trivial AI. Borderline aortic  root dilation (41 mm), Mild LA dilation.  Marland Kitchen Nm myoview ltd  03/19/2015    FALSE POSITIVE:  INTERMEDIATE RISK. Small sized, moderate intensity inferior ischemic perfusion defect  . Left heart catheterization with coronary angiogram N/A 04/10/2015    Procedure: LEFT HEART CATHETERIZATION WITH  CORONARY ANGIOGRAM;  Surgeon: Leonie Man, MD;  Location: [facility] CATH LAB;  Service: Cardiovascular;  Angiographicallly NORMAL CORONARY ARTERIES    There were no vitals filed for this visit.  Visit Diagnosis:  Lymphedema of arm      Subjective Assessment - 11/12/15 1110    Subjective Doing fine, ready for my sleeve to arrive! Ordered my nighttime garment yesterday. My arm looks red from the elbow down, thats new. I just took the bandages off about 20 minutes ago.     Currently in Pain? No/denies               LYMPHEDEMA/ONCOLOGY QUESTIONNAIRE - 11/12/15 1111    Lymphedema Stage   Stage STAGE 2 SPONTANEOUSLY IRREVERSIBLE   Right Upper Extremity Lymphedema   15 cm Proximal to Olecranon Process 31.8 cm   10 cm Proximal to Olecranon Process 31.5 cm   Olecranon Process 32.7 cm   15 cm Proximal to Ulnar Styloid Process 32.6 cm   10 cm Proximal to Ulnar Styloid Process 29.4 cm   Just Proximal to Ulnar Styloid Process 21 cm   Across Hand at Thumb  Web Space 22.6 cm   At [place] of 2nd Digit 7.6 cm                  [facility] Adult PT Treatment/Exercise - 11/12/15 0001    Manual Therapy   Manual Lymphatic Drainage (MLD) short neck, superficial and deep abdomen, left axilla and upper quadrant and anterior interaxillary anastomosis, right groin and axillo-inguinal anastomosis, and right UE, including elbow, from fingers to shoulder.   Compression Bandaging Bandaging with thick  stockinette, Elastomull x 1 on first four fingers, with thick foam piece on back of hand extending proximal to wrist and a piece at thenar aspect of wrist.  Artiflex x 1 over lined peach foam at forearm and 1-6 cm., 1-10  cm. and 2-12 cm. bandages from hand to shoulder on right with second to last one in herringbone pattern on forearm.                   Short Term Clinic Goals - 10/15/15 1556    CC Short Term Goal  #2   Title Right arm circumference at 15 cm. proximal to ulnar styloid reduced to 33 cm.  32.9 cm reduction attained today 10/15/15   Status Achieved             Long Term Clinic Goals - 11/12/15 1218    CC Long Term Goal  #1   Title Right arm circumference at 15 cm. proximal to ulnar styloid reduced to 31 cm.   Status Achieved   CC Long Term Goal  #2   Title Right hand circumference reduced to 24 cm.   Status Achieved   CC Long Term Goal  #3   Title Pt. will be knowledgeable about how and where to obtain daytime compression sleeve and glove, and nighttime garment if he wants one  These should be arriving next week   Status Achieved   CC Long Term Goal  #4   Title Pt. will be knowledgeable about lymphedema risk reduction practices   Status Achieved            Plan - 11/12/15 1200    Clinical Impression Statement Pt had removed his bandages immediately before treatment today and his forearm appeared slightly redder than usual and especially at his anterior elbow though pt reported no irritation felt and his forearm did not feel hot to touch. Redness had lessened after manual lymph drainage today in appearance so continued with bandaging as done at last appt. His circumference measurements had good reductions at his wrist and upper arm, slight increases at his forearm from last week but still well below weeks past measurements. Overall his Stage 2 lymphedema has responded well to complete decongestive therapy with reductions noted in his circumferential measurements overall since his start of care    Pt will benefit from skilled therapeutic intervention in order to improve on the following deficits Increased edema;Decreased knowledge of precautions;Decreased knowledge of use  of DME   Rehab Potential Excellent   Clinical Impairments Affecting Rehab Potential stage 2 lymphedema with some keratotic changes on forearm    PT Frequency 3x / week   PT Duration 6 weeks   PT Treatment/Interventions Manual lymph drainage;Compression bandaging;ADLs/Self Care Home Management   PT Next Visit Plan Gcode required next visit. Continue complete decongestive therapy until garments arrive.   Consulted and Agree with Plan of Care Patient        Problem List Patient Active Problem List   Diagnosis Date Noted  .  Lymphedema 09/21/2015  . Encounter for immunization 09/21/2015  . Otitis, externa, infective 08/10/2015  . Abnormal exercise myocardial perfusion study 04/02/2015  . Medicare annual wellness visit, subsequent 03/16/2015  . Paroxysmal atrial fibrillation - auto rate control 03/16/2015  . Hypertriglyceridemia 03/16/2015  . Bradycardia 12/30/2014  . Melanoma of back ([place])   . Mass of right chest wall 05/16/2014  . Mass of chest wall, right subfacial, 12cm s/p removal w rib resection 05/16/2014 03/08/2014  . Mass of posterior neck, SQ 4cm 03/08/2014  . Chest wall mass - right SQ anterior 5cm 08/21/2013  . Post herpetic neuralgia 07/09/2013  . Blepharitis of right lower eyelid 08/03/2012  . OSA on CPAP 07/30/2011  . ELEVATED PROSTATE SPECIFIC ANTIGEN 01/13/2010  . LIPOMA, SKIN 08/29/2008  . GERD 08/29/2008  . BENIGN PROSTATIC HYPERTROPHY 08/29/2008  . SHOULDER PAIN, LEFT 08/29/2008  . ELEVATED BP READING WITHOUT DX HYPERTENSION 08/29/2008    Otelia Limes, PTA 11/12/2015, 12:19 PM  [place], [place], [address] Phone: [phone]   Fax:  [phone]  Name: [name] [name] MRN: [id number] Date of Birth: 1941-02-18    PHYSICAL THERAPY DISCHARGE SUMMARY  Visits from Start of Care: 19  Current functional level related to goals / functional outcomes: Goals met as  noted above.   Remaining deficits: Swelling will continue to require management.   Education / Equipment: Lymphedema self-care; compression garments were received. Plan: Patient agrees to discharge.  Patient goals were met. Patient is being discharged due to meeting the stated rehab goals.  ?????   Serafina Royals, PT 01/26/2016 8:47 AM

## 2015-11-14 ENCOUNTER — Ambulatory Visit: Payer: Medicare Other | Admitting: Physical Therapy

## 2015-11-17 ENCOUNTER — Ambulatory Visit: Payer: Medicare Other | Admitting: Physical Therapy

## 2015-11-19 ENCOUNTER — Ambulatory Visit: Payer: Medicare Other | Admitting: Physical Therapy

## 2015-11-21 ENCOUNTER — Encounter: Payer: PRIVATE HEALTH INSURANCE | Admitting: Physical Therapy

## 2015-11-24 ENCOUNTER — Ambulatory Visit: Payer: Medicare Other | Admitting: Physical Therapy

## 2015-11-26 ENCOUNTER — Encounter: Payer: PRIVATE HEALTH INSURANCE | Admitting: Physical Therapy

## 2015-11-28 ENCOUNTER — Encounter: Payer: PRIVATE HEALTH INSURANCE | Admitting: Physical Therapy

## 2016-02-17 ENCOUNTER — Other Ambulatory Visit: Payer: Self-pay | Admitting: Family Medicine

## 2016-03-21 ENCOUNTER — Other Ambulatory Visit: Payer: Self-pay | Admitting: Family Medicine

## 2016-03-29 ENCOUNTER — Telehealth: Payer: Self-pay | Admitting: Behavioral Health

## 2016-03-29 NOTE — Telephone Encounter (Signed)
Appointment confirmed for 03/30/16 at 9:30 AM with Mariane Duval, RN.

## 2016-03-30 ENCOUNTER — Ambulatory Visit (INDEPENDENT_AMBULATORY_CARE_PROVIDER_SITE_OTHER): Payer: Medicare Other

## 2016-03-30 VITALS — BP 116/76 | HR 65 | Ht 70.0 in | Wt 238.0 lb

## 2016-03-30 DIAGNOSIS — Z0000 Encounter for general adult medical examination without abnormal findings: Secondary | ICD-10-CM | POA: Diagnosis not present

## 2016-03-30 DIAGNOSIS — Z0100 Encounter for examination of eyes and vision without abnormal findings: Secondary | ICD-10-CM

## 2016-03-30 DIAGNOSIS — I480 Paroxysmal atrial fibrillation: Secondary | ICD-10-CM

## 2016-03-30 NOTE — Progress Notes (Signed)
RN AWV note reviewed. Agree with documention and plan. 

## 2016-03-30 NOTE — Progress Notes (Signed)
Pre visit review using our clinic review tool, if applicable. No additional management support is needed unless otherwise documented below in the visit note. 

## 2016-03-30 NOTE — Progress Notes (Signed)
Subjective:   [name] [name] is a 75 y.o. male who presents for Medicare Annual/Subsequent preventive examination.  He is accompanied by his wife.  He is in his normal state of health today.  Only complaint is limited ROM of ring finger on left hand.  He is having trouble bending it.  He was advised to follow up with Dr. Charlett Blake next week regarding his concern.  He agreed.     Review of Systems: No ROS Cardiac Risk Factors include: advanced age (>55men, >71 women);obesity (BMI >30kg/m2);male gender Sleep patterns:   Sleeps at least 7-8 hours per night/sleeps through the night.  Home Safety/Smoke Alarms:  Feels safe at home; lives with wife in one level home with downstairs basement.   Firearm Safety:  Kept in safe place.   Seat Belt Safety/Bike Helmet:  Always wears seat belt.    Counseling:   Eye Exam- Refer placed for Dr. Prudencio Burly for eye exam per patient's request.   Dental- Goes every 6 months.  Dr. Alpha Gula Male:  [facility]-12/13/13-[facility]    PSA-01/15/10-4.94       Objective:    Vitals: BP 116/76 mmHg  Pulse 65  Ht 5\' 10"  (1.778 m)  Wt 238 lb (107.956 kg)  BMI 34.15 kg/m2  SpO2 98%  Body mass index is 34.15 kg/(m^2).  Tobacco History  Smoking status  . Former Smoker  . Quit date: 02/11/1989  Smokeless tobacco  . Never Used     Counseling given: No   Past Medical History  Diagnosis Date  . GERD (gastroesophageal reflux disease)     controlled w/ diet and behavioral changes  . BPH (benign prostatic hypertrophy)   . Elevated PSA   . Shingles 07/09/2013  . Encounter for Medicare annual wellness exam 09/22/2013    Sees Dr Wilhemina Bonito of Derm Sees Dr Silvano Rusk of Gastroenterology Sees Dr Kathie Rhodes of [facility] Sees Dr Susa Day of Optometry  . Snoring disorder 07/30/2011  . Sleep apnea 07/30/2011    cpap- 13   . Bradycardia 12/30/2014    HR routinely in mid 40s-50s  . New onset a-fib ([place]) 03/10/2015  . Hypertriglyceridemia 03/16/2015  . White coat hypertension    . Melanoma of back ([place])     Excised Dr Wilhemina Bonito  . Coronary artery disease (CAD) excluded April 2016    False-positive nuclear stress test suggesting inferior ischemia  . Otitis, externa, infective 08/10/2015   Past Surgical History  Procedure Laterality Date  . Mass excision N/A 05/16/2014    Procedure: EXCISION POSTERIOR NECK MASS, RIGHT CHEST WALL MASS AND RIGHT AXILLARY MASS AXILLARY NODE DISSECTION;  Surgeon: Adin Hector, MD;  Location: [facility];  Service: General;  Laterality: N/A;  . Transthoracic echocardiogram  03/20/2015    Normal LV size and function. EF 60-65%. G1 DD. Trivial AI. Borderline aortic root dilation (41 mm), Mild LA dilation.  Marland Kitchen Nm myoview ltd  03/19/2015    FALSE POSITIVE:  INTERMEDIATE RISK. Small sized, moderate intensity inferior ischemic perfusion defect  . Left heart catheterization with coronary angiogram N/A 04/10/2015    Procedure: LEFT HEART CATHETERIZATION WITH  CORONARY ANGIOGRAM;  Surgeon: Leonie Man, MD;  Location: [facility] CATH LAB;  Service: Cardiovascular;  Angiographicallly NORMAL CORONARY ARTERIES   Family History  Problem Relation Age of Onset  . Cancer Mother     MM, leukemia  . Emphysema Father   . Cancer Sister     brain  . Alcohol abuse Other   .  COPD Brother   . Heart disease Paternal Grandmother   . COPD Paternal Grandfather   . Diabetes Sister   . Obesity Sister   . Down syndrome Brother   . Colon cancer Neg Hx    History  Sexual Activity  . 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 aspirin 81 MG chewable tablet Chew 81 mg by mouth every Monday, Wednesday, and Friday.   . Cholecalciferol (VITAMIN D) 2000 UNITS CAPS Take 1 capsule by mouth daily.  . diazepam (VALIUM) 5 MG tablet Take 0.5-2 tablets (2.5-10 mg total) by mouth every evening. As needed  . gabapentin (NEURONTIN) 300 MG  capsule TAKE THREE CAPSULES BY MOUTH THREE TIMES DAILY  . KRILL OIL PO Take 1 capsule by mouth daily.  . Multiple Vitamin (MULTIVITAMIN WITH MINERALS) TABS tablet Take 1 tablet by mouth daily.   No facility-administered encounter medications on file as of 03/30/2016.    Activities of Daily Living In your present state of health, do you have any difficulty performing the following activities: 03/30/2016 04/10/2015  Hearing? N N  Vision? N N  Difficulty concentrating or making decisions? N N  Walking or climbing stairs? N N  Dressing or bathing? N N  Doing errands, shopping? N -  Preparing Food and eating ? N -  Using the Toilet? N -  In the past six months, have you accidently leaked urine? Y -  Do you have problems with loss of bowel control? N -  Managing your Medications? N -  Managing your Finances? N -  Housekeeping or managing your Housekeeping? N -    Patient Care Team: Mosie Lukes, MD as PCP - General (Family Medicine) Gatha Mayer, MD as Consulting Physician (Gastroenterology) Danella Sensing, MD as Consulting Physician (Dermatology) Kathie Rhodes, MD as Consulting Physician (Urology) Leonie Man, MD as Consulting Physician (Cardiology) Lynda Rainwater, DDS as Consulting Physician (Dentistry)   Assessment:  Deferred to PCP.    Exercise Activities and Dietary recommendations Current Exercise Habits: Structured exercise class (Does yardwork ), Type of exercise: walking (gentle aerobics), Time (Minutes): 55, Frequency (Times/Week): 5, Weekly Exercise (Minutes/Week): 275   Diet:  Well-nourished.  Regular diet.  3 meals per day.  Really good appetite.  Pt states "I'm on a seafood diet---whatever I see, I eat."     Goals    . Lose 10lbs by this time next year.        Fall Risk Fall Risk  03/30/2016 03/10/2015 12/24/2014 09/22/2013 09/20/2013  Falls in the past year? No No No No No   Depression Screen PHQ 2/9 Scores 03/30/2016 03/10/2015 03/10/2015 12/24/2014  PHQ - 2 Score  0 0 0 0  PHQ- 9 Score - 0 - -    Cognitive Testing MMSE - Mini Mental State Exam 03/30/2016  Orientation to time 5  Orientation to Place 5  Registration 3  Attention/ Calculation 5  Recall 2  Language- name 2 objects 2  Language- repeat 1  Language- follow 3 step command 3  Language- read & follow direction 1  Write a sentence 1  Copy design 1  Total score 29    Immunization History  Administered Date(s) Administered  . Influenza Whole 09/15/2009, 09/23/2010  . Influenza,inj,Quad PF,36+ Mos 09/20/2013, 10/18/2014, 09/08/2015  . Pneumococcal Conjugate-13 11/19/2013  . Pneumococcal Polysaccharide-23 04/09/2003, 10/31/2015  .  Td 06/04/2010   Screening Tests Health Maintenance  Topic Date Due  . INFLUENZA VACCINE  07/27/2016  . TETANUS/TDAP  06/04/2020  . COLONOSCOPY  12/14/2023  . ZOSTAVAX  Completed  . PNA vac Low Risk Adult  Completed      Plan:  Continue doing brain stimulating activities (puzzles, reading, adult coloring books, staying active) to keep memory sharp.   Eat heart healthy diet (full of fruits, vegetables, whole grains, lean protein, water--limit salt, fat, and sugar intake).    Watch portion sizes.    Watch sodium intake.  Continue to stay physically active.  Follow up with Dr. Charlett Blake as scheduled.    Bring in copy of Advanced Directive at next visit.     During the course of the visit the patient was educated and counseled about the following appropriate screening and preventive services:   Vaccines to include Pneumoccal, Influenza, Hepatitis B, Td, Zostavax, HCV  Electrocardiogram  Cardiovascular Disease  Colorectal cancer screening  Diabetes screening  Prostate Cancer Screening  Glaucoma screening  Nutrition counseling   Smoking cessation counseling  Patient Instructions (the written plan) was given to the patient.    Rudene Anda, RN  03/30/2016

## 2016-03-30 NOTE — Patient Instructions (Addendum)
Continue doing brain stimulating activities (puzzles, reading, adult coloring books, staying active) to keep memory sharp.   Eat heart healthy diet (full of fruits, vegetables, whole grains, lean protein, water--limit salt, fat, and sugar intake).    Watch portion sizes.    Watch sodium intake.  Continue to stay physically active.  Follow up with Dr. Charlett Blake as scheduled.    Bring in copy of Advanced Directive at next visit.      Fat and Cholesterol Restricted Diet High levels of fat and cholesterol in your blood may lead to various health problems, such as diseases of the heart, blood vessels, gallbladder, liver, and pancreas. Fats are concentrated sources of energy that come in various forms. Certain types of fat, including saturated fat, may be harmful in excess. Cholesterol is a substance needed by your body in small amounts. Your body makes all the cholesterol it needs. Excess cholesterol comes from the food you eat. When you have high levels of cholesterol and saturated fat in your blood, health problems can develop because the excess fat and cholesterol will gather along the walls of your blood vessels, causing them to narrow. Choosing the right foods will help you control your intake of fat and cholesterol. This will help keep the levels of these substances in your blood within normal limits and reduce your risk of disease. WHAT IS MY PLAN? Your health care provider recommends that you:  Get no more than __________ % of the total calories in your daily diet from fat.  Limit your intake of saturated fat to less than ______% of your total calories each day.  Limit the amount of cholesterol in your diet to less than _________mg per day. WHAT TYPES OF FAT SHOULD I CHOOSE?  Choose healthy fats more often. Choose monounsaturated and polyunsaturated fats, such as olive and canola oil, flaxseeds, walnuts, almonds, and seeds.  Eat more omega-3 fats. Good choices include salmon, mackerel,  sardines, tuna, flaxseed oil, and ground flaxseeds. Aim to eat fish at least two times a week.  Limit saturated fats. Saturated fats are primarily found in animal products, such as meats, butter, and cream. Plant sources of saturated fats include palm oil, palm kernel oil, and coconut oil.  Avoid foods with partially hydrogenated oils in them. These contain trans fats. Examples of foods that contain trans fats are stick margarine, some tub margarines, cookies, crackers, and other baked goods. WHAT GENERAL GUIDELINES DO I NEED TO FOLLOW? These guidelines for healthy eating will help you control your intake of fat and cholesterol:  Check food labels carefully to identify foods with trans fats or high amounts of saturated fat.  Fill one half of your plate with vegetables and green salads.  Fill one fourth of your plate with whole grains. Look for the word "whole" as the first word in the ingredient list.  Fill one fourth of your plate with lean protein foods.  Limit fruit to two servings a day. Choose fruit instead of juice.  Eat more foods that contain soluble fiber. Examples of foods that contain this type of fiber are apples, broccoli, carrots, beans, peas, and barley. Aim to get 20-30 g of fiber per day.  Eat more home-cooked food and less restaurant, buffet, and fast food.  Limit or avoid alcohol.  Limit foods high in starch and sugar.  Limit fried foods.  Cook foods using methods other than frying. Baking, boiling, grilling, and broiling are all great options.  Lose weight if you are overweight. Losing  just 5-10% of your initial body weight can help your overall health and prevent diseases such as diabetes and heart disease. WHAT FOODS CAN I EAT? Grains Whole grains, such as whole wheat or whole grain breads, crackers, cereals, and pasta. Unsweetened oatmeal, bulgur, barley, quinoa, or brown rice. Corn or whole wheat flour tortillas. Vegetables Fresh or frozen vegetables (raw,  steamed, roasted, or grilled). Green salads. Fruits All fresh, canned (in natural juice), or frozen fruits. Meat and Other Protein Products Ground beef (85% or leaner), grass-fed beef, or beef trimmed of fat. Skinless chicken or [place]. Ground chicken or [place]. Pork trimmed of fat. All fish and seafood. Eggs. Dried beans, peas, or lentils. Unsalted nuts or seeds. Unsalted canned or dry beans. Dairy Low-fat dairy products, such as skim or 1% milk, 2% or reduced-fat cheeses, low-fat ricotta or cottage cheese, or plain low-fat yogurt. Fats and Oils Tub margarines without trans fats. Light or reduced-fat mayonnaise and salad dressings. Avocado. Olive, canola, sesame, or safflower oils. Natural peanut or almond butter (choose ones without added sugar and oil). The items listed above may not be a complete list of recommended foods or beverages. Contact your dietitian for more options. WHAT FOODS ARE NOT RECOMMENDED? Grains White bread. White pasta. White rice. Cornbread. Bagels, pastries, and croissants. Crackers that contain trans fat. Vegetables White potatoes. Corn. Creamed or fried vegetables. Vegetables in a cheese sauce. Fruits Dried fruits. Canned fruit in light or heavy syrup. Fruit juice. Meat and Other Protein Products Fatty cuts of meat. Ribs, chicken wings, bacon, sausage, bologna, salami, chitterlings, fatback, hot dogs, bratwurst, and packaged luncheon meats. Liver and organ meats. Dairy Whole or 2% milk, cream, half-and-half, and cream cheese. Whole milk cheeses. Whole-fat or sweetened yogurt. Full-fat cheeses. Nondairy creamers and whipped toppings. Processed cheese, cheese spreads, or cheese curds. Sweets and Desserts Corn syrup, sugars, honey, and molasses. Candy. Jam and jelly. Syrup. Sweetened cereals. Cookies, pies, cakes, donuts, muffins, and ice cream. Fats and Oils Butter, stick margarine, lard, shortening, ghee, or bacon fat. Coconut, palm kernel, or palm  oils. Beverages Alcohol. Sweetened drinks (such as sodas, lemonade, and fruit drinks or punches). The items listed above may not be a complete list of foods and beverages to avoid. Contact your dietitian for more information.   This information is not intended to replace advice given to you by your health care provider. Make sure you discuss any questions you have with your health care provider.   Document Released: 12/13/2005 Document Revised: 01/03/2015 Document Reviewed: 03/13/2014 Elsevier Interactive Patient Education 2016 [place] Maintenance, Male A healthy lifestyle and preventative care can promote health and wellness.  Maintain regular health, dental, and eye exams.  Eat a healthy diet. Foods like vegetables, fruits, whole grains, low-fat dairy products, and lean protein foods contain the nutrients you need and are low in calories. Decrease your intake of foods high in solid fats, added sugars, and salt. Get information about a proper diet from your health care provider, if necessary.  Regular physical exercise is one of the most important things you can do for your health. Most adults should get at least 150 minutes of moderate-intensity exercise (any activity that increases your heart rate and causes you to sweat) each week. In addition, most adults need muscle-strengthening exercises on 2 or more days a week.   Maintain a healthy weight. The body mass index (BMI) is a screening tool to identify possible weight problems. It provides an estimate of body fat based on  height and weight. Your health care provider can find your BMI and can help you achieve or maintain a healthy weight. For males 20 years and older:  A BMI below 18.5 is considered underweight.  A BMI of 18.5 to 24.9 is normal.  A BMI of 25 to 29.9 is considered overweight.  A BMI of 30 and above is considered obese.  Maintain normal blood lipids and cholesterol by exercising and minimizing your  intake of saturated fat. Eat a balanced diet with plenty of fruits and vegetables. Blood tests for lipids and cholesterol should begin at age 77 and be repeated every 5 years. If your lipid or cholesterol levels are high, you are over age 62, or you are at high risk for heart disease, you may need your cholesterol levels checked more frequently.Ongoing high lipid and cholesterol levels should be treated with medicines if diet and exercise are not working.  If you smoke, find out from your health care provider how to quit. If you do not use tobacco, do not start.  Lung cancer screening is recommended for adults aged 9-80 years who are at high risk for developing lung cancer because of a history of smoking. A yearly low-dose CT scan of the lungs is recommended for people who have at least a 30-pack-year history of smoking and are current smokers or have quit within the past 15 years. A pack year of smoking is smoking an average of 1 pack of cigarettes a day for 1 year (for example, a 30-pack-year history of smoking could mean smoking 1 pack a day for 30 years or 2 packs a day for 15 years). Yearly screening should continue until the smoker has stopped smoking for at least 15 years. Yearly screening should be stopped for people who develop a health problem that would prevent them from having lung cancer treatment.  If you choose to drink alcohol, do not have more than 2 drinks per day. One drink is considered to be 12 oz (360 mL) of beer, 5 oz (150 mL) of wine, or 1.5 oz (45 mL) of liquor.  Avoid the use of street drugs. Do not share needles with anyone. Ask for help if you need support or instructions about stopping the use of drugs.  High blood pressure causes heart disease and increases the risk of stroke. High blood pressure is more likely to develop in:  People who have blood pressure in the end of the normal range (100-139/85-89 mm Hg).  People who are overweight or obese.  People who are  African American.  If you are 29-84 years of age, have your blood pressure checked every 3-5 years. If you are 21 years of age or older, have your blood pressure checked every year. You should have your blood pressure measured twice--once when you are at a hospital or clinic, and once when you are not at a hospital or clinic. Record the average of the two measurements. To check your blood pressure when you are not at a hospital or clinic, you can use:  An automated blood pressure machine at a pharmacy.  A home blood pressure monitor.  If you are 26-50 years old, ask your health care provider if you should take aspirin to prevent heart disease.  Diabetes screening involves taking a blood sample to check your fasting blood sugar level. This should be done once every 3 years after age 70 if you are at a normal weight and without risk factors for diabetes. Testing should be  considered at a younger age or be carried out more frequently if you are overweight and have at least 1 risk factor for diabetes.  Colorectal cancer can be detected and often prevented. Most routine colorectal cancer screening begins at the age of 32 and continues through age 56. However, your health care provider may recommend screening at an earlier age if you have risk factors for colon cancer. On a yearly basis, your health care provider may provide home test kits to check for hidden blood in the stool. A small camera at the end of a tube may be used to directly examine the colon (sigmoidoscopy or colonoscopy) to detect the earliest forms of colorectal cancer. Talk to your health care provider about this at age 36 when routine screening begins. A direct exam of the colon should be repeated every 5-10 years through age 78, unless early forms of precancerous polyps or small growths are found.  People who are at an increased risk for hepatitis B should be screened for this virus. You are considered at high risk for hepatitis B  if:  You were born in a country where hepatitis B occurs often. Talk with your health care provider about which countries are considered high risk.  Your parents were born in a high-risk country and you have not received a shot to protect against hepatitis B (hepatitis B vaccine).  You have HIV or AIDS.  You use needles to inject street drugs.  You live with, or have sex with, someone who has hepatitis B.  You are a man who has sex with other men (MSM).  You get hemodialysis treatment.  You take certain medicines for conditions like cancer, organ transplantation, and autoimmune conditions.  Hepatitis C blood testing is recommended for all people born from [birth year] through 1963 and any individual with known risk factors for hepatitis C.  Healthy men should no longer receive prostate-specific antigen (PSA) blood tests as part of routine cancer screening. Talk to your health care provider about prostate cancer screening.  Testicular cancer screening is not recommended for adolescents or adult males who have no symptoms. Screening includes self-exam, a health care provider exam, and other screening tests. Consult with your health care provider about any symptoms you have or any concerns you have about testicular cancer.  Practice safe sex. Use condoms and avoid high-risk sexual practices to reduce the spread of sexually transmitted infections (STIs).  You should be screened for STIs, including gonorrhea and chlamydia if:  You are sexually active and are younger than 24 years.  You are older than 24 years, and your health care provider tells you that you are at risk for this type of infection.  Your sexual activity has changed since you were last screened, and you are at an increased risk for chlamydia or gonorrhea. Ask your health care provider if you are at risk.  If you are at risk of being infected with HIV, it is recommended that you take a prescription medicine daily to prevent HIV  infection. This is called pre-exposure prophylaxis (PrEP). You are considered at risk if:  You are a man who has sex with other men (MSM).  You are a heterosexual man who is sexually active with multiple partners.  You take drugs by injection.  You are sexually active with a partner who has HIV.  Talk with your health care provider about whether you are at high risk of being infected with HIV. If you choose to begin PrEP, you should  first be tested for HIV. You should then be tested every 3 months for as long as you are taking PrEP.  Use sunscreen. Apply sunscreen liberally and repeatedly throughout the day. You should seek shade when your shadow is shorter than you. Protect yourself by wearing long sleeves, pants, a wide-brimmed hat, and sunglasses year round whenever you are outdoors.  Tell your health care provider of new moles or changes in moles, especially if there is a change in shape or color. Also, tell your health care provider if a mole is larger than the size of a pencil eraser.  A one-time screening for abdominal aortic aneurysm (AAA) and surgical repair of large AAAs by ultrasound is recommended for men aged 28-75 years who are current or former smokers.  Stay current with your vaccines (immunizations).   This information is not intended to replace advice given to you by your health care provider. Make sure you discuss any questions you have with your health care provider.   Document Released: 06/10/2008 Document Revised: 01/03/2015 Document Reviewed: 05/10/2011 Elsevier Interactive Patient Education [employer].

## 2016-03-30 NOTE — Assessment & Plan Note (Signed)
Asymptomatic.  Rate controlled without medication.  Taking low dose ASA.  Followed by Dr. Ellyn Hack.

## 2016-04-12 ENCOUNTER — Encounter: Payer: Self-pay | Admitting: Family Medicine

## 2016-04-12 ENCOUNTER — Ambulatory Visit (INDEPENDENT_AMBULATORY_CARE_PROVIDER_SITE_OTHER): Payer: Medicare Other | Admitting: Family Medicine

## 2016-04-12 VITALS — BP 108/84 | HR 78 | Temp 97.5°F | Ht 70.0 in | Wt 239.2 lb

## 2016-04-12 DIAGNOSIS — R001 Bradycardia, unspecified: Secondary | ICD-10-CM | POA: Diagnosis not present

## 2016-04-12 DIAGNOSIS — G4733 Obstructive sleep apnea (adult) (pediatric): Secondary | ICD-10-CM | POA: Diagnosis not present

## 2016-04-12 DIAGNOSIS — K219 Gastro-esophageal reflux disease without esophagitis: Secondary | ICD-10-CM

## 2016-04-12 DIAGNOSIS — R030 Elevated blood-pressure reading, without diagnosis of hypertension: Secondary | ICD-10-CM

## 2016-04-12 DIAGNOSIS — I890 Lymphedema, not elsewhere classified: Secondary | ICD-10-CM

## 2016-04-12 DIAGNOSIS — Z9989 Dependence on other enabling machines and devices: Secondary | ICD-10-CM

## 2016-04-12 DIAGNOSIS — M79645 Pain in left finger(s): Secondary | ICD-10-CM

## 2016-04-12 DIAGNOSIS — B0229 Other postherpetic nervous system involvement: Secondary | ICD-10-CM | POA: Diagnosis not present

## 2016-04-12 DIAGNOSIS — E781 Pure hyperglyceridemia: Secondary | ICD-10-CM

## 2016-04-12 LAB — COMPREHENSIVE METABOLIC PANEL
ALBUMIN: 3.8 g/dL (ref 3.5–5.2)
ALK PHOS: 48 U/L (ref 39–117)
ALT: 42 U/L (ref 0–53)
AST: 29 U/L (ref 0–37)
BUN: 21 mg/dL (ref 6–23)
CHLORIDE: 107 meq/L (ref 96–112)
CO2: 24 mEq/L (ref 19–32)
CREATININE: 1.03 mg/dL (ref 0.40–1.50)
Calcium: 9.3 mg/dL (ref 8.4–10.5)
GFR: 75.18 mL/min (ref >60.00)
GLUCOSE: 71 mg/dL (ref 70–99)
Potassium: 4.1 mEq/L (ref 3.5–5.1)
SODIUM: 140 meq/L (ref 135–145)
TOTAL PROTEIN: 6.8 g/dL (ref 6.0–8.3)
Total Bilirubin: 0.8 mg/dL (ref 0.2–1.2)

## 2016-04-12 LAB — CBC
HEMATOCRIT: 42.4 % (ref 39.0–52.0)
Hemoglobin: 14.2 g/dL (ref 13.0–17.0)
MCHC: 33.6 g/dL (ref 30.0–36.0)
MCV: 92.9 fl (ref 78.0–100.0)
Platelets: 194 10*3/uL (ref 150.0–400.0)
RBC: 4.56 Mil/uL (ref 4.22–5.81)
RDW: 15.4 % (ref 11.5–15.5)
WBC: 7.6 10*3/uL (ref 4.0–10.5)

## 2016-04-12 LAB — LIPID PANEL
CHOLESTEROL: 148 mg/dL (ref 0–200)
HDL: 45.1 mg/dL (ref >39.00)
LDL Cholesterol: 82 mg/dL (ref 0–99)
NonHDL: 102.65
Total CHOL/HDL Ratio: 3
Triglycerides: 105 mg/dL (ref 0.0–149.0)
VLDL: 21 mg/dL (ref 0.0–40.0)

## 2016-04-12 LAB — TSH: TSH: 1.77 u[IU]/mL (ref 0.35–4.50)

## 2016-04-12 NOTE — Assessment & Plan Note (Signed)
Well controlled. Encouraged heart healthy diet such as the DASH diet and exercise as tolerated.  

## 2016-04-12 NOTE — Assessment & Plan Note (Signed)
Encouraged heart healthy diet, increase exercise, avoid trans fats, consider a krill oil cap daily 

## 2016-04-12 NOTE — Patient Instructions (Signed)
Salon Pas gel twice daily if no better call for referral  Dupuytren's Contracture Dupuytren's contracture affects the fingers and the palm of the hand. This condition usually develops slowly. It may take many years to develop. The pinky finger and the ring finger are most often affected. These fingers start to curve inward, like a claw. At some point, the fingers cannot go straight anymore. This can make it hard to do things like:  Put on gloves.  Shake hands.  Grab something off a shelf. The condition usually does not cause pain and is not dangerous. The condition gets its name from the doctor who came up with an operation to fix the problem. His name was Lanney Gins Dupuytren. Contracture means pulling inward. CAUSES  Dupuytren's contracture does not start with the fingers. It starts in the palm of the hand, under the skin. The tissue under the skin is called fascia. The fascia covers the cords (tendons) that control how the fingers move. In Dupuytren's contracture the fascia tissue becomes thick and then pulls on the cords. That causes the fingers to curl. The condition can affect both hands and any fingers, but it usually strikes one hand worse than the other. The fingers farthest from the thumb are most often the ones that curl. The cause is not clear. Some experts believe it results from an autoimmune reaction. That means the body's immune system (which fights off disease) attacks itself by mistake. What experts do know is that certain conditions and behaviors (called risk factors) make the chance of having this condition more likely. They include:  Age. Most people who have the condition are older than 50.  Sex. It affects men more often than women.  Family history. The condition tends to run in families from countries in [place] and [place].  Certain behaviors. People who smoke and drink alcohol are more apt to develop the problem.  Some other medical conditions. Having  diabetes makes Dupuytren's contracture more likely. So does having a condition that involves a seizure (when the brain's function is interrupted). SYMPTOMS  Signs of this condition take time to develop. Sometimes this takes weeks or months. More often, it takes several years.   Early symptoms:  Skin on the palm of the hand becomes thick. This is usually the first sign.  The skin may look dimpled or puckered.  Lumps (nodules) show up on the palm. There may be one or more lumps. They are not painful.  Later symptoms:  Thick cords of tissue form in the palm of the hand.  The pinky and ring fingers start to curl up into the palm.  The fingers cannot be straightened into their normal position. DIAGNOSIS  A physical examination is the main way that a healthcare provider can tell if you have Dupuytren's contracture. Other tests usually are not needed. The caregiver will probably:  Look at your hands. Feel your hands. This is to check for thickening and nodules.  Measure finger motion. This tells how much your fingers have contracted (pulled in).  Do a tabletop test. You will be asked to try to put your hand flat on a table, palm down. TREATMENT  There is no cure for Dupuytren's contracture. But there are ways to treat the symptoms. Options include:  Watching and waiting. The condition develops slowly. Often it does not create problems for a long time. Sometimes the skin gets thick and nodules form, but the fingers never curl. So, in some cases it is best to  just watch the condition carefully and wait to see what happens.  Shots (injections). Different substances may be injected, including:  Steroids. These drugs block swelling. These shots should make the condition less uncomfortable. Steroids may also slow down the condition. Shots are given into the nodules. The effect only lasts awhile. More shots may have to be given.  Enzymes. These are proteins. They weaken the thick tissue. After  an injection, the caregiver usually stretches the fingers.  Needling. A needle is pushed through the skin and into the thick tissue. This is done in several spots. The goal is to break up the thickened tissue. Or to weaken it.  Surgery. This may be suggested if you cannot grasp objects. Or, if you can no longer put your hand in your pocket.  A cut (incision) is made in the palm of the hand. The thick tissue is removed.  Sometimes the thick tissue is attached to the skin. Then, the skin must be removed, too. It is replaced with a piece of skin from another place on your body. That is called a skin graft.  Occupational or hand therapy is almost always needed after surgery. This involves special exercises to get back the use of your hand and fingers. After a skin graft, several months of therapy may be needed.  Sometimes the condition comes back, even after surgery.  Other methods. You can do some things on your own. They include:  Stretching the fingers backwards. Do this often.  Warming the hand and massaging it. Again, do this often.  Using tools with padded grips. This should make things easier.  Wearing heavy gloves while working. This protects the hands. PROGNOSIS  Dupuytren's contracture usually develops slowly. There is no cure. But, the symptoms can be treated. Sometimes they come back after treatment, but not always. It is important to remember that this is a functional problem and not a life-threatening condition.   This information is not intended to replace advice given to you by your health care provider. Make sure you discuss any questions you have with your health care provider.   Document Released: 10/10/2009 Document Revised: 01/03/2015 Document Reviewed: 10/10/2009 Elsevier Interactive Patient Education [employer].

## 2016-04-12 NOTE — Assessment & Plan Note (Signed)
Using CPAP and doing well.  

## 2016-04-12 NOTE — Assessment & Plan Note (Signed)
Wearing sleeve with goof results to right arm

## 2016-04-12 NOTE — Assessment & Plan Note (Signed)
Struggles some but manageable with Gabapentin, encouraged to try Salon Pas Lidocaine patches prn for severe pain.

## 2016-04-12 NOTE — Progress Notes (Signed)
Subjective:    Patient ID: [name] [name], male    DOB: 03-Jun-1941, 75 y.o.   MRN: [id number]  Chief Complaint  Patient presents with  . Follow-up    HPI Patient is in today for follow up and has some concern with left finger when bending. His finger is stiff and swollen. No redness or warmth. No trauma. Worse over past couple of weeks. Denies CP/palp/SOB/HA/congestion/fevers/GI or GU c/o. Taking meds as prescribed  Past Medical History  Diagnosis Date  . GERD (gastroesophageal reflux disease)     controlled w/ diet and behavioral changes  . BPH (benign prostatic hypertrophy)   . Elevated PSA   . Shingles 07/09/2013  . Encounter for Medicare annual wellness exam 09/22/2013    Sees Dr Wilhemina Bonito of Derm Sees Dr Silvano Rusk of Gastroenterology Sees Dr Kathie Rhodes of [facility] Sees Dr Susa Day of Optometry  . Snoring disorder 07/30/2011  . Sleep apnea 07/30/2011    cpap- 13   . Bradycardia 12/30/2014    HR routinely in mid 40s-50s  . New onset a-fib ([place]) 03/10/2015  . Hypertriglyceridemia 03/16/2015  . White coat hypertension   . Melanoma of back ([place])     Excised Dr Wilhemina Bonito  . Coronary artery disease (CAD) excluded April 2016    False-positive nuclear stress test suggesting inferior ischemia  . Otitis, externa, infective 08/10/2015  . Finger pain, left 04/12/2016    4th     Past Surgical History  Procedure Laterality Date  . Mass excision N/A 05/16/2014    Procedure: EXCISION POSTERIOR NECK MASS, RIGHT CHEST WALL MASS AND RIGHT AXILLARY MASS AXILLARY NODE DISSECTION;  Surgeon: Adin Hector, MD;  Location: [facility];  Service: General;  Laterality: N/A;  . Transthoracic echocardiogram  03/20/2015    Normal LV size and function. EF 60-65%. G1 DD. Trivial AI. Borderline aortic root dilation (41 mm), Mild LA dilation.  Marland Kitchen Nm myoview ltd  03/19/2015    FALSE POSITIVE:  INTERMEDIATE RISK. Small sized, moderate intensity inferior ischemic perfusion defect  . Left heart  catheterization with coronary angiogram N/A 04/10/2015    Procedure: LEFT HEART CATHETERIZATION WITH  CORONARY ANGIOGRAM;  Surgeon: Leonie Man, MD;  Location: [facility] CATH LAB;  Service: Cardiovascular;  Angiographicallly NORMAL CORONARY ARTERIES    Family History  Problem Relation Age of Onset  . Cancer Mother     MM, leukemia  . Emphysema Father   . Cancer Sister     brain  . Alcohol abuse Other   . COPD Brother   . Heart disease Paternal Grandmother   . COPD Paternal Grandfather   . Diabetes Sister   . Obesity Sister   . Down syndrome Brother   . Colon cancer Neg Hx     Social History   Social History  . Marital Status: Married    Spouse Name: [name]  . Number of Children: 2  . Years of Education: N/A   Occupational History  . retired     Administrator   Social History Main Topics  . Smoking status: Former Smoker    Quit date: 02/11/1989  . Smokeless tobacco: Never Used  . Alcohol Use: 4.2 oz/week    7 Glasses of wine per week     Comment: 1 per day -- brandy or wine  . Drug Use: No  . Sexual Activity: Not on file     Comment: lives with wife, retired from [employer] driving, no dietary restrictions  Other Topics Concern  . Not on file   Social History Narrative   He is a married father of 2, and grandfather of 62. He has been married to his wife [name] for 51 years. He previously lived in [place] but has moved with his wife to [place], [place] to be close to his daughter [name] and her family. [name] is our Nurse, adult.   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 He quit smoking in 1990. He has 6-7 glasses of brandy or wine a week.    He exercises regularly with low impact aerobics 2 days a week and daily walks of 30-45 minutes. His exercise sessions or 60 minutes. He will do some type of exercise at least 7 days a week and is very active doing yard work and wood work.    Outpatient Prescriptions Prior to Visit    Medication Sig Dispense Refill  . aspirin 81 MG chewable tablet Chew 81 mg by mouth every Monday, Wednesday, and Friday.     . Cholecalciferol (VITAMIN D) 2000 UNITS CAPS Take 1 capsule by mouth daily.    . diazepam (VALIUM) 5 MG tablet Take 0.5-2 tablets (2.5-10 mg total) by mouth every evening. As needed 30 tablet 1  . gabapentin (NEURONTIN) 300 MG capsule TAKE THREE CAPSULES BY MOUTH THREE TIMES DAILY 270 capsule 0  . KRILL OIL PO Take 1 capsule by mouth daily.    . Multiple Vitamin (MULTIVITAMIN WITH MINERALS) TABS tablet Take 1 tablet by mouth daily.     No facility-administered medications prior to visit.    No Known Allergies  Review of Systems  Constitutional: Negative for fever and malaise/fatigue.  HENT: Negative for congestion.   Eyes: Negative for blurred vision.  Respiratory: Negative for shortness of breath.   Cardiovascular: Positive for chest pain. Negative for palpitations and leg swelling.  Gastrointestinal: Negative for nausea, abdominal pain and blood in stool.  Genitourinary: Negative for dysuria and frequency.  Musculoskeletal: Positive for joint pain. Negative for falls.  Skin: Negative for rash.  Neurological: Negative for dizziness, loss of consciousness and headaches.  Endo/Heme/Allergies: Negative for environmental allergies.  Psychiatric/Behavioral: Negative for depression. The patient is not nervous/anxious.        Objective:    Physical Exam  Constitutional: He is oriented to person, place, and time. He appears well-developed and well-nourished. No distress.  HENT:  Head: Normocephalic and atraumatic.  Eyes: Conjunctivae are normal.  Neck: Neck supple. No thyromegaly present.  Cardiovascular: Normal rate, regular rhythm and normal heart sounds.   No murmur heard. Pulmonary/Chest: Effort normal and breath sounds normal. No respiratory distress. He has no wheezes.  Abdominal: Soft. Bowel sounds are normal. He exhibits no mass. There is no  tenderness.  Musculoskeletal: He exhibits no edema.  Left 4th finger slightly swollen and decreased flexion due to pain and swelling, callouses at base of finger noted  Lymphadenopathy:    He has no cervical adenopathy.  Neurological: He is alert and oriented to person, place, and time.  Skin: Skin is warm and dry.  Psychiatric: He has a normal mood and affect. His behavior is normal.    BP 108/84 mmHg  Pulse 78  Temp(Src) 97.5 F (36.4 C) (Oral)  Ht 5\' 10"  (1.778 m)  Wt 239 lb 3.2 oz (108.5 kg)  BMI 34.32 kg/m2  SpO2 98% Wt Readings from Last 3 Encounters:  04/12/16 239 lb 3.2 oz (108.5 kg)  03/30/16 238  lb (107.956 kg)  10/31/15 236 lb 2 oz (107.106 kg)     Lab Results  Component Value Date   WBC 7.9 09/08/2015   HGB 13.6 09/08/2015   HCT 41.7 09/08/2015   PLT 201.0 09/08/2015   GLUCOSE 89 09/08/2015   CHOL 133 07/22/2015   TRIG 67.0 07/22/2015   HDL 44.70 07/22/2015   LDLCALC 75 07/22/2015   ALT 40 09/08/2015   AST 28 09/08/2015   NA 142 09/08/2015   K 4.6 09/08/2015   CL 108 09/08/2015   CREATININE 1.04 09/08/2015   BUN 28* 09/08/2015   CO2 28 09/08/2015   TSH 1.99 07/22/2015   PSA 4.94* 01/15/2010   INR 1.22 04/04/2015    Lab Results  Component Value Date   TSH 1.99 07/22/2015   Lab Results  Component Value Date   WBC 7.9 09/08/2015   HGB 13.6 09/08/2015   HCT 41.7 09/08/2015   MCV 87.9 09/08/2015   PLT 201.0 09/08/2015   Lab Results  Component Value Date   NA 142 09/08/2015   K 4.6 09/08/2015   CO2 28 09/08/2015   GLUCOSE 89 09/08/2015   BUN 28* 09/08/2015   CREATININE 1.04 09/08/2015   BILITOT 0.6 09/08/2015   ALKPHOS 51 09/08/2015   AST 28 09/08/2015   ALT 40 09/08/2015   PROT 6.6 09/08/2015   ALBUMIN 3.9 09/08/2015   CALCIUM 9.0 09/08/2015   GFR 74.47 09/08/2015   Lab Results  Component Value Date   CHOL 133 07/22/2015   Lab Results  Component Value Date   HDL 44.70 07/22/2015   Lab Results  Component Value Date    LDLCALC 75 07/22/2015   Lab Results  Component Value Date   TRIG 67.0 07/22/2015   Lab Results  Component Value Date   CHOLHDL 3 07/22/2015   No results found for: HGBA1C     Assessment & Plan:   Problem List Items Addressed This Visit    Bradycardia (Chronic)   Relevant Orders   TSH   CBC   Lipid panel   Comprehensive metabolic panel   ELEVATED BP READING WITHOUT DX HYPERTENSION    Well controlled. Encouraged heart healthy diet such as the DASH diet and exercise as tolerated.       Relevant Orders   TSH   CBC   Lipid panel   Comprehensive metabolic panel   Finger pain, left    Appears to be an early Dupuytren's contracture, advised Salon Pas gel bid and if no improvement referral      GERD - Primary   Relevant Orders   TSH   CBC   Lipid panel   Comprehensive metabolic panel   Hypertriglyceridemia (Chronic)    Encouraged heart healthy diet, increase exercise, avoid trans fats, consider a krill oil cap daily      Relevant Orders   TSH   CBC   Lipid panel   Comprehensive metabolic panel   Lymphedema    Wearing sleeve with goof results to right arm      Relevant Orders   TSH   CBC   Lipid panel   Comprehensive metabolic panel   OSA on CPAP (Chronic)    Using CPAP and doing well      Relevant Orders   TSH   CBC   Lipid panel   Comprehensive metabolic panel   Post herpetic neuralgia    Struggles some but manageable with Gabapentin, encouraged to try Salon Pas Lidocaine patches prn for severe pain.  Relevant Orders   TSH   CBC   Lipid panel   Comprehensive metabolic panel      I am having Mr. Mansoor maintain his aspirin, multivitamin with minerals, KRILL OIL PO, Vitamin D, diazepam, and gabapentin.  No orders of the defined types were placed in this encounter.     [name], MD

## 2016-04-12 NOTE — Assessment & Plan Note (Signed)
Appears to be an early Dupuytren's contracture, advised Salon Pas gel bid and if no improvement referral

## 2016-04-14 DIAGNOSIS — R972 Elevated prostate specific antigen [PSA]: Secondary | ICD-10-CM | POA: Diagnosis not present

## 2016-04-14 DIAGNOSIS — N401 Enlarged prostate with lower urinary tract symptoms: Secondary | ICD-10-CM | POA: Diagnosis not present

## 2016-04-17 ENCOUNTER — Other Ambulatory Visit: Payer: Self-pay | Admitting: Family Medicine

## 2016-04-22 DIAGNOSIS — N401 Enlarged prostate with lower urinary tract symptoms: Secondary | ICD-10-CM | POA: Diagnosis not present

## 2016-04-22 DIAGNOSIS — N138 Other obstructive and reflux uropathy: Secondary | ICD-10-CM | POA: Diagnosis not present

## 2016-04-22 DIAGNOSIS — Z0000 Encounter for general adult medical examination without abnormal findings: Secondary | ICD-10-CM | POA: Diagnosis not present

## 2016-04-22 DIAGNOSIS — R972 Elevated prostate specific antigen [PSA]: Secondary | ICD-10-CM | POA: Diagnosis not present

## 2016-05-12 DIAGNOSIS — Z85820 Personal history of malignant melanoma of skin: Secondary | ICD-10-CM | POA: Diagnosis not present

## 2016-05-12 DIAGNOSIS — D225 Melanocytic nevi of trunk: Secondary | ICD-10-CM | POA: Diagnosis not present

## 2016-05-12 DIAGNOSIS — I8392 Asymptomatic varicose veins of left lower extremity: Secondary | ICD-10-CM | POA: Diagnosis not present

## 2016-05-12 DIAGNOSIS — L821 Other seborrheic keratosis: Secondary | ICD-10-CM | POA: Diagnosis not present

## 2016-05-12 DIAGNOSIS — D1801 Hemangioma of skin and subcutaneous tissue: Secondary | ICD-10-CM | POA: Diagnosis not present

## 2016-05-17 ENCOUNTER — Other Ambulatory Visit: Payer: Self-pay | Admitting: Family Medicine

## 2016-05-21 DIAGNOSIS — H11001 Unspecified pterygium of right eye: Secondary | ICD-10-CM | POA: Diagnosis not present

## 2016-05-21 DIAGNOSIS — Z0100 Encounter for examination of eyes and vision without abnormal findings: Secondary | ICD-10-CM | POA: Diagnosis not present

## 2016-05-21 DIAGNOSIS — H2513 Age-related nuclear cataract, bilateral: Secondary | ICD-10-CM | POA: Diagnosis not present

## 2016-05-21 DIAGNOSIS — H5310 Unspecified subjective visual disturbances: Secondary | ICD-10-CM | POA: Diagnosis not present

## 2016-06-17 ENCOUNTER — Other Ambulatory Visit: Payer: Self-pay | Admitting: Family Medicine

## 2016-06-19 ENCOUNTER — Emergency Department (HOSPITAL_BASED_OUTPATIENT_CLINIC_OR_DEPARTMENT_OTHER): Payer: Medicare Other

## 2016-06-19 ENCOUNTER — Emergency Department (HOSPITAL_BASED_OUTPATIENT_CLINIC_OR_DEPARTMENT_OTHER)
Admission: EM | Admit: 2016-06-19 | Discharge: 2016-06-20 | Disposition: A | Discharge status: Home / Self Care | Payer: Medicare Other | Attending: Emergency Medicine | Admitting: Emergency Medicine

## 2016-06-19 ENCOUNTER — Encounter (HOSPITAL_BASED_OUTPATIENT_CLINIC_OR_DEPARTMENT_OTHER): Payer: Self-pay | Admitting: Emergency Medicine

## 2016-06-19 DIAGNOSIS — Z7982 Long term (current) use of aspirin: Secondary | ICD-10-CM | POA: Insufficient documentation

## 2016-06-19 DIAGNOSIS — R319 Hematuria, unspecified: Secondary | ICD-10-CM | POA: Diagnosis present

## 2016-06-19 DIAGNOSIS — N201 Calculus of ureter: Secondary | ICD-10-CM | POA: Insufficient documentation

## 2016-06-19 DIAGNOSIS — Z87891 Personal history of nicotine dependence: Secondary | ICD-10-CM | POA: Diagnosis not present

## 2016-06-19 DIAGNOSIS — I4891 Unspecified atrial fibrillation: Secondary | ICD-10-CM | POA: Diagnosis not present

## 2016-06-19 DIAGNOSIS — N132 Hydronephrosis with renal and ureteral calculous obstruction: Secondary | ICD-10-CM | POA: Diagnosis not present

## 2016-06-19 HISTORY — DX: Nonspecific lymphadenitis, unspecified: I88.9

## 2016-06-19 LAB — URINALYSIS, ROUTINE W REFLEX MICROSCOPIC
BILIRUBIN URINE: NEGATIVE
Glucose, UA: NEGATIVE mg/dL
KETONES UR: NEGATIVE mg/dL
NITRITE: NEGATIVE
PROTEIN: NEGATIVE mg/dL
SPECIFIC GRAVITY, URINE: 1.019 (ref 1.005–1.030)
pH: 5.5 (ref 5.0–8.0)

## 2016-06-19 LAB — URINE MICROSCOPIC-ADD ON

## 2016-06-19 NOTE — ED Notes (Signed)
Pt states his urine was "very bloody" this a.m., but has since cleared. Also had some left flank pain earlier, but none at present.

## 2016-06-19 NOTE — ED Notes (Signed)
Patient reports that he had blood in his urine since this am.  Reports that he had worse blood this am and left sided flank pain. The patient reports that he the pain is much better at this time

## 2016-06-20 MED ORDER — PROMETHAZINE HCL 25 MG PO TABS: 25.0000 mg | ORAL_TABLET | Freq: Three times a day (TID) | ORAL | Status: DC | PRN

## 2016-06-20 MED ORDER — HYDROCODONE-ACETAMINOPHEN 5-325 MG PO TABS: 1.0000 | ORAL_TABLET | Freq: Four times a day (QID) | ORAL | Status: DC | PRN

## 2016-06-20 NOTE — Discharge Instructions (Signed)
Return here as needed. Follow up with your Urologist °

## 2016-06-20 NOTE — ED Provider Notes (Signed)
CSN: [id number]     Arrival date & time 06/19/16  1923 History   First MD Initiated Contact with Patient 06/19/16 2136     Chief Complaint  Patient presents with  . Hematuria     (Consider location/radiation/quality/duration/timing/severity/associated sxs/prior Treatment) HPI Patient presents to the emergency department with hematuria and flank pain that started earlier today.  The patient states that he noticed blood in his urine earlier today and then this evening he had severe flank pain.  Patient states this time.  He is not having any pain at all, that he did not take any medications prior to arrival.  Nothing seemed to make the condition better or worseThe patient denies chest pain, shortness of breath, headache,blurred vision, neck pain, fever, cough, weakness, numbness, dizziness, anorexia, edema,  vomiting, diarrhea, rash, back pain, dysuria, hematemesis, bloody stool, near syncope, or syncope. Past Medical History  Diagnosis Date  . GERD (gastroesophageal reflux disease)     controlled w/ diet and behavioral changes  . BPH (benign prostatic hypertrophy)   . Elevated PSA   . Shingles 07/09/2013  . Encounter for Medicare annual wellness exam 09/22/2013    Sees Dr Wilhemina Bonito of Derm Sees Dr Silvano Rusk of Gastroenterology Sees Dr Kathie Rhodes of [facility] Sees Dr Susa Day of Optometry  . Snoring disorder 07/30/2011  . Sleep apnea 07/30/2011    cpap- 13   . Bradycardia 12/30/2014    HR routinely in mid 40s-50s  . New onset a-fib ([place]) 03/10/2015  . Hypertriglyceridemia 03/16/2015  . White coat hypertension   . Melanoma of back ([place])     Excised Dr Wilhemina Bonito  . Coronary artery disease (CAD) excluded April 2016    False-positive nuclear stress test suggesting inferior ischemia  . Otitis, externa, infective 08/10/2015  . Finger pain, left 04/12/2016    4th   . Lymphadenitis    Past Surgical History  Procedure Laterality Date  . Mass excision N/A 05/16/2014    Procedure:  EXCISION POSTERIOR NECK MASS, RIGHT CHEST WALL MASS AND RIGHT AXILLARY MASS AXILLARY NODE DISSECTION;  Surgeon: Adin Hector, MD;  Location: [facility];  Service: General;  Laterality: N/A;  . Transthoracic echocardiogram  03/20/2015    Normal LV size and function. EF 60-65%. G1 DD. Trivial AI. Borderline aortic root dilation (41 mm), Mild LA dilation.  Marland Kitchen Nm myoview ltd  03/19/2015    FALSE POSITIVE:  INTERMEDIATE RISK. Small sized, moderate intensity inferior ischemic perfusion defect  . Left heart catheterization with coronary angiogram N/A 04/10/2015    Procedure: LEFT HEART CATHETERIZATION WITH  CORONARY ANGIOGRAM;  Surgeon: Leonie Man, MD;  Location: [facility] CATH LAB;  Service: Cardiovascular;  Angiographicallly NORMAL CORONARY ARTERIES   Family History  Problem Relation Age of Onset  . Cancer Mother     MM, leukemia  . Emphysema Father   . Cancer Sister     brain  . Alcohol abuse Other   . COPD Brother   . Heart disease Paternal Grandmother   . COPD Paternal Grandfather   . Diabetes Sister   . Obesity Sister   . Down syndrome Brother   . Colon cancer Neg Hx    Social History  Substance Use Topics  . Smoking status: Former Smoker    Quit date: 02/11/1989  . Smokeless tobacco: Never Used  . Alcohol Use: 4.2 oz/week    7 Glasses of wine per week     Comment: 1 per day -- brandy or wine  Review of Systems All pertinent positives and negatives reviewed in the history of present illness   Allergies  Review of patient's allergies indicates no known allergies.  Home Medications   Prior to Admission medications   Medication Sig Start Date End Date Taking? Authorizing Provider  aspirin 81 MG chewable tablet Chew 81 mg by mouth every Monday, Wednesday, and Friday.     Historical Provider, MD  Cholecalciferol (VITAMIN D) 2000 UNITS CAPS Take 1 capsule by mouth daily.    Historical Provider, MD  diazepam (VALIUM) 5 MG tablet Take 0.5-2 tablets (2.5-10 mg total) by mouth every  evening. As needed 07/29/15   Mosie Lukes, MD  gabapentin (NEURONTIN) 300 MG capsule TAKE THREE CAPSULES BY MOUTH THREE TIMES DAILY 06/17/16   Mosie Lukes, MD  KRILL OIL PO Take 1 capsule by mouth daily.    Historical Provider, MD  Multiple Vitamin (MULTIVITAMIN WITH MINERALS) TABS tablet Take 1 tablet by mouth daily.    Historical Provider, MD   BP 137/109 mmHg  Pulse 58  Temp(Src) 97.5 F (36.4 C) (Oral)  Resp 18  Ht 5\' 10"  (1.778 m)  Wt 104.327 kg  BMI 33.00 kg/m2  SpO2 99% Physical Exam  Constitutional: He is oriented to person, place, and time. He appears well-developed and well-nourished. No distress.  HENT:  Head: Normocephalic and atraumatic.  Mouth/Throat: Oropharynx is clear and moist.  Eyes: Pupils are equal, round, and reactive to light.  Neck: Normal range of motion. Neck supple.  Cardiovascular: Normal rate, regular rhythm and normal heart sounds.  Exam reveals no gallop and no friction rub.   No murmur heard. Pulmonary/Chest: Effort normal and breath sounds normal. No respiratory distress. He has no wheezes.  Abdominal: Soft. Bowel sounds are normal. He exhibits no distension. There is no tenderness.  Neurological: He is alert and oriented to person, place, and time. He exhibits normal muscle tone. Coordination normal.  Skin: Skin is warm and dry. No rash noted. No erythema.  Psychiatric: He has a normal mood and affect. His behavior is normal.  Nursing note and vitals reviewed.   ED Course  Procedures (including critical care time) Labs Review Labs Reviewed  URINALYSIS, ROUTINE W REFLEX MICROSCOPIC (NOT AT [facility]) - Abnormal; Notable for the following:    Color, Urine AMBER (*)    APPearance CLOUDY (*)    Hgb urine dipstick LARGE (*)    Leukocytes, UA SMALL (*)    All other components within normal limits  URINE MICROSCOPIC-ADD ON - Abnormal; Notable for the following:    Squamous Epithelial / LPF 0-5 (*)    Bacteria, UA FEW (*)    All other components  within normal limits    Imaging Review Ct Renal Stone Study  06/19/2016  CLINICAL DATA:  75 year old male with left-sided flank pain and hematuria. EXAM: CT ABDOMEN AND PELVIS WITHOUT CONTRAST TECHNIQUE: Multidetector CT imaging of the abdomen and pelvis was performed following the standard protocol without IV contrast. COMPARISON:  None. FINDINGS: Evaluation of this exam is limited in the absence of intravenous contrast. The visualized lung bases are clear. No intra-abdominal free air or free fluid. Layering sludge versus small stones within the gallbladder. No pericholecystic fluid. The liver, pancreas, spleen appear unremarkable. There is a 2 cm hypodense left adrenal nodule, likely an adenoma. There is a 5 mm left UPJ stone with mild left hydronephrosis. A 7 mm nonobstructing left renal interpolar calculus noted. Multiple left renal parapelvic cysts noted. There is no hydronephrosis  or nephrolithiasis identified. An 18 mm right renal exophytic hypodense lesion is not characterized on this noncontrast study. Likely represents a cyst. The urinary bladder is grossly unremarkable. The prostate gland is mildly enlarged measuring 5 cm in transverse axial diameter. There is sigmoid diverticulosis without active inflammatory changes. There is a small hiatal hernia. No evidence of bowel obstruction or active inflammation. Normal appendix. The abdominal aorta and IVC appear unremarkable. No portal venous gas identified. There is no adenopathy. Small bilateral fat containing inguinal hernias. The abdominal wall soft tissues appear unremarkable. There is osteopenia with degenerative changes of the spine. There is compression deformity of the L3 vertebra with approximately 50% loss of vertebral body height, likely chronic. No definite cyst acute fracture. IMPRESSION: A 5 mm left UPJ stone with mild left hydronephrosis. A 7 mm nonobstructing left renal interpolar calculus. Diverticulosis. No evidence of bowel obstruction  or active inflammation. Normal appendix. Electronically Signed   By: Anner Crete M.D.   On: 06/19/2016 22:34   I have personally reviewed and evaluated these images and lab results as part of my medical decision-making.  Patient had symptoms consistent with kidney stone.  CT scan reveals that he has a 5 mm left UPJ stone.  I advised the patient of the test results and all questions were answered.  I advised him he will need to follow-up with his urologist as soon as possible.  Patient states that he currently is not having pain and a give him return precautions and he voices an understanding of this  Dalia Heading, PA-C 06/23/16 [place], MD 06/23/16 [phone]

## 2016-06-20 NOTE — ED Notes (Signed)
Pt given d/c instructions as per chart. Rx x 2. Verbalizes understanding. No questions. 

## 2016-06-21 DIAGNOSIS — N202 Calculus of kidney with calculus of ureter: Secondary | ICD-10-CM | POA: Diagnosis not present

## 2016-06-21 DIAGNOSIS — N281 Cyst of kidney, acquired: Secondary | ICD-10-CM | POA: Diagnosis not present

## 2016-06-25 DIAGNOSIS — N202 Calculus of kidney with calculus of ureter: Secondary | ICD-10-CM | POA: Diagnosis not present

## 2016-07-13 DIAGNOSIS — Z87442 Personal history of urinary calculi: Secondary | ICD-10-CM | POA: Diagnosis not present

## 2016-07-13 DIAGNOSIS — R972 Elevated prostate specific antigen [PSA]: Secondary | ICD-10-CM | POA: Diagnosis not present

## 2016-07-20 ENCOUNTER — Other Ambulatory Visit: Payer: Self-pay | Admitting: Family Medicine

## 2016-08-20 ENCOUNTER — Other Ambulatory Visit: Payer: Self-pay | Admitting: Physician Assistant

## 2016-08-20 NOTE — Telephone Encounter (Signed)
Rx request to pharmacy/SLS  

## 2016-09-10 ENCOUNTER — Other Ambulatory Visit: Payer: Self-pay | Admitting: Family Medicine

## 2016-09-13 MED ORDER — GABAPENTIN 300 MG PO CAPS: 900.0000 mg | ORAL_CAPSULE | Freq: Three times a day (TID) | ORAL | 0 refills | Status: DC

## 2016-09-21 ENCOUNTER — Ambulatory Visit (INDEPENDENT_AMBULATORY_CARE_PROVIDER_SITE_OTHER): Payer: Medicare Other

## 2016-09-21 DIAGNOSIS — Z23 Encounter for immunization: Secondary | ICD-10-CM

## 2016-10-05 IMAGING — CT CT RENAL STONE PROTOCOL
2 of 4 series · 16 of 46 positions shown, 18 images · non-contrast
Comparison: None.

CLINICAL DATA: 75-year-old male with left-sided flank pain and
hematuria.

EXAM:
CT ABDOMEN AND PELVIS WITHOUT CONTRAST
TECHNIQUE: Multidetector CT imaging of the abdomen and pelvis was performed
following the standard protocol without IV contrast.

[Series 2: axial st · axial · 0.80mm/px · z∈[-624,-239]mm · 13 of 85 slices shown, 15 images]
[im 4/85  soft-tissue]
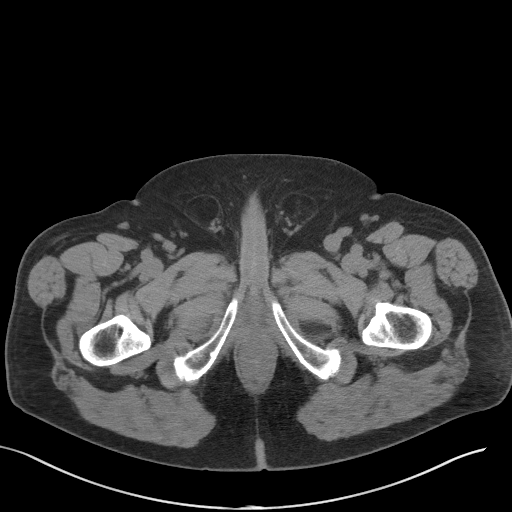
[im 4/85  bone]
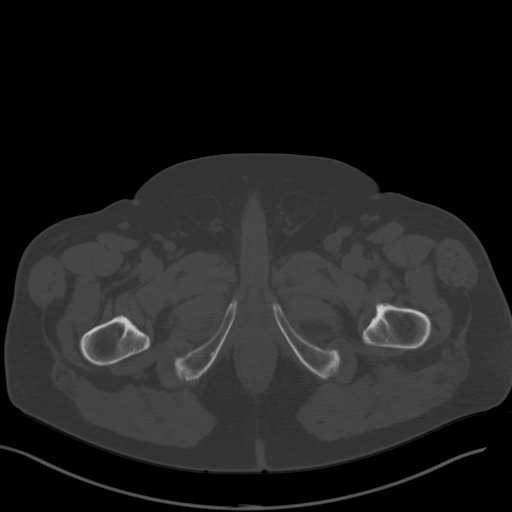
[im 11/85  soft-tissue]
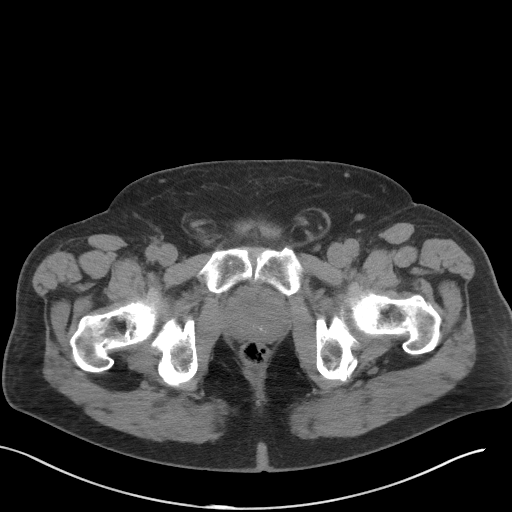
[im 18/85  soft-tissue]
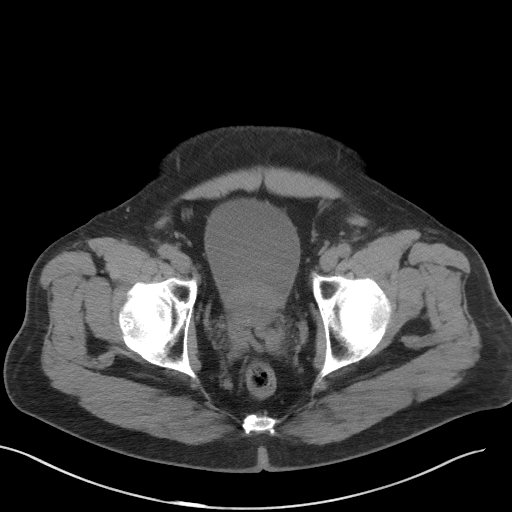
[im 25/85  soft-tissue]
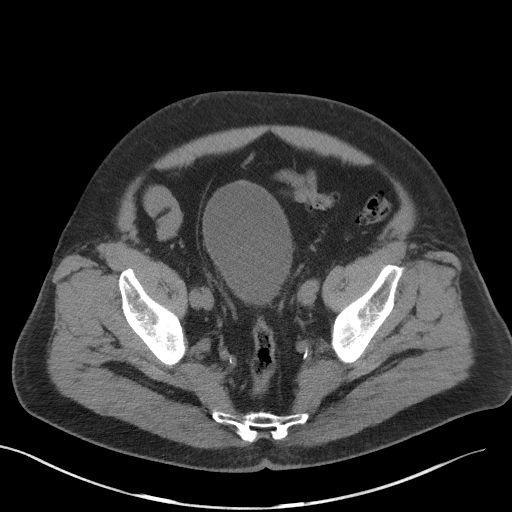
[im 29/85  soft-tissue]
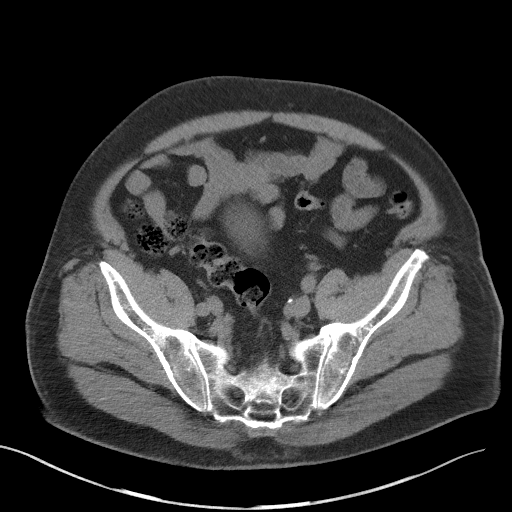
[im 36/85  soft-tissue]
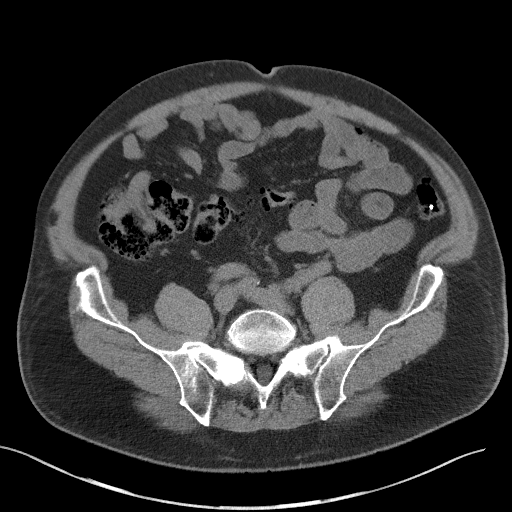
[im 43/85  soft-tissue]
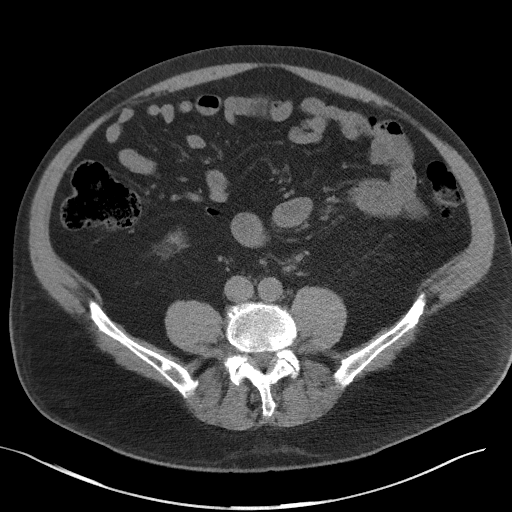
[im 50/85  soft-tissue]
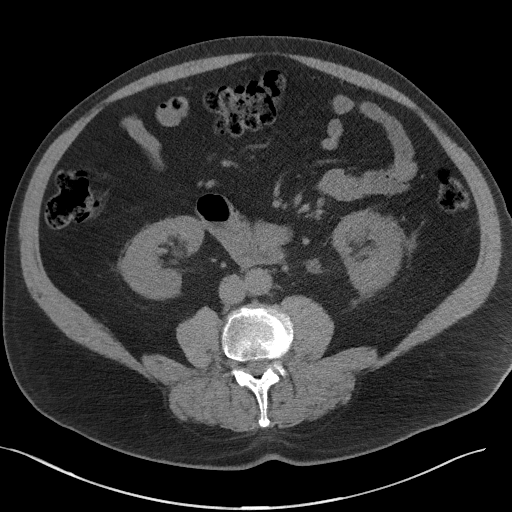
[im 57/85  soft-tissue]
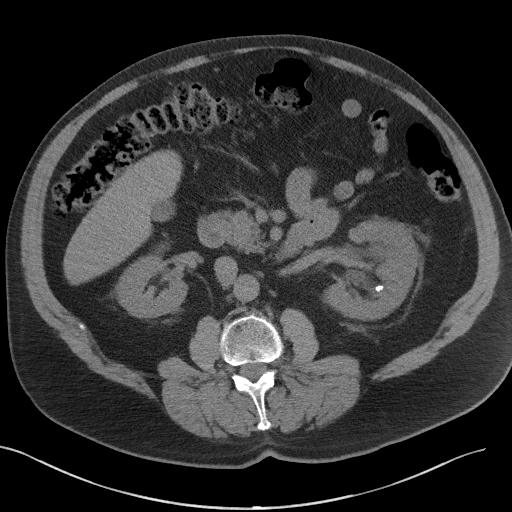
[im 57/85  bone]
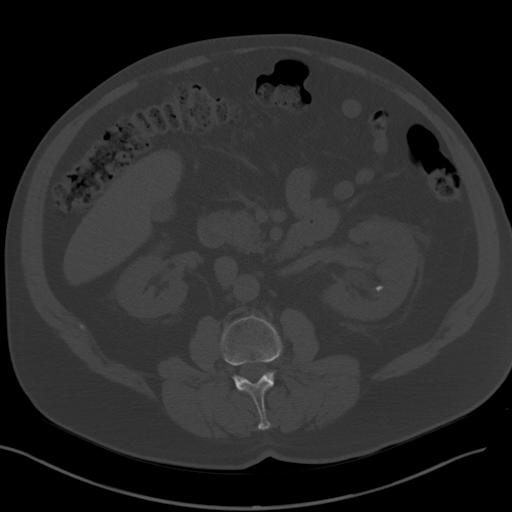
[im 60/85  soft-tissue]
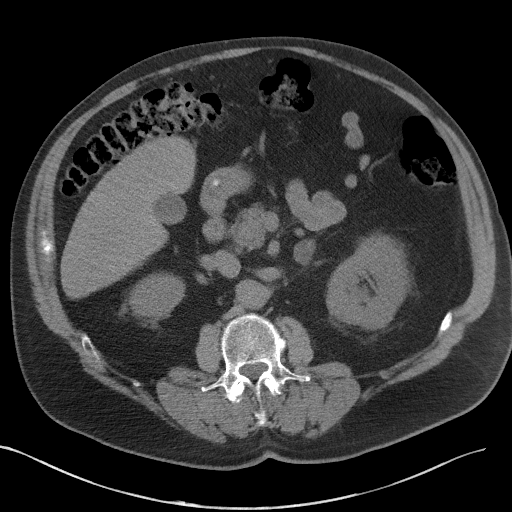
[im 67/85  soft-tissue]
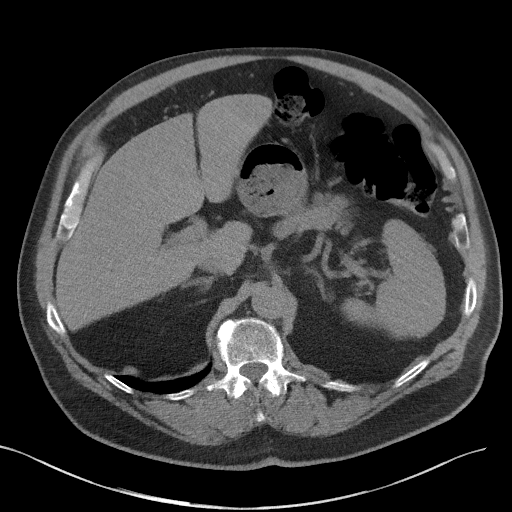
[im 74/85  soft-tissue]
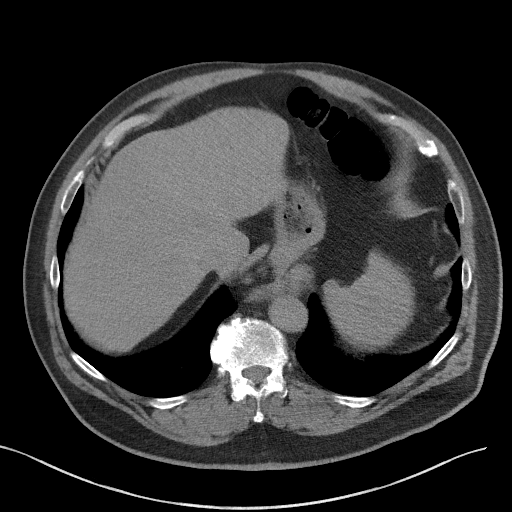
[im 81/85  soft-tissue]
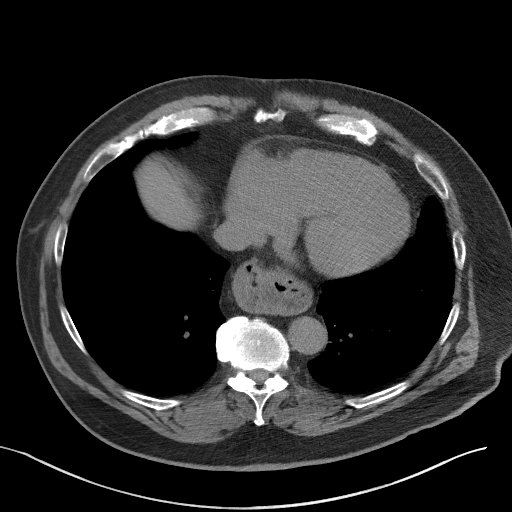

[Series 4: coronal st · coronal · 0.85mm/px · 3 of 122 slices shown]
[im 41/122  soft-tissue]
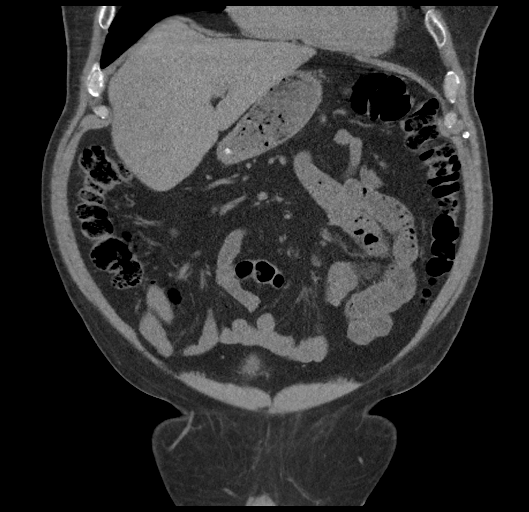
[im 54/122  soft-tissue]
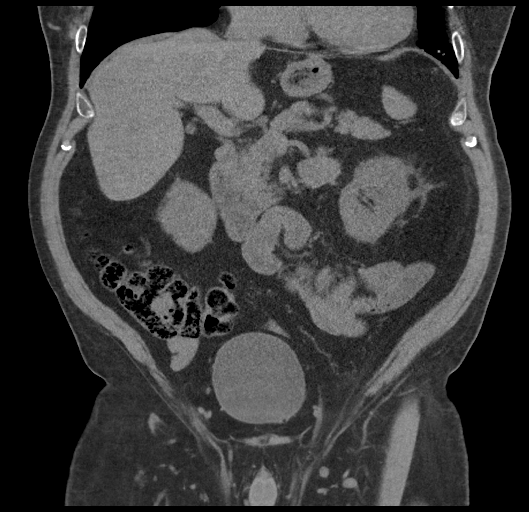
[im 68/122  soft-tissue]
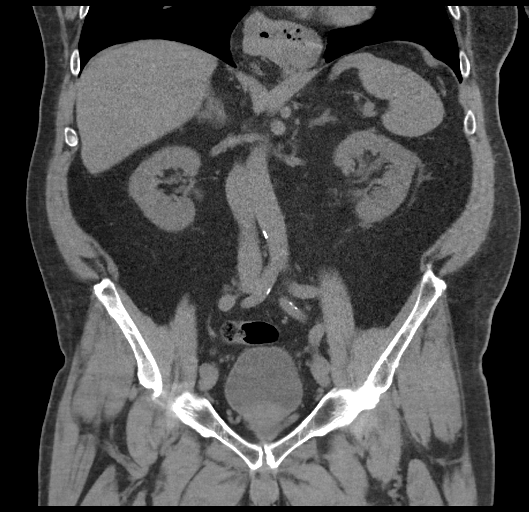

[16 of 46 positions shown; findings below may reference images not displayed]

FINDINGS: Evaluation of this exam is limited in the absence of intravenous
contrast.

The visualized lung bases are clear. No intra-abdominal free air or
free fluid.

Layering sludge versus small stones within the gallbladder. No
pericholecystic fluid. The liver, pancreas, spleen appear
unremarkable. There is a 2 cm hypodense left adrenal nodule, likely
an adenoma.

There is a 5 mm left UPJ stone with mild left hydronephrosis. A 7 mm
nonobstructing left renal interpolar calculus noted. Multiple left
renal parapelvic cysts noted. There is no hydronephrosis or
nephrolithiasis identified. An 18 mm right renal exophytic hypodense
lesion is not characterized on this noncontrast study. Likely
represents a cyst. The urinary bladder is grossly unremarkable. The
prostate gland is mildly enlarged measuring 5 cm in transverse axial
diameter.

There is sigmoid diverticulosis without active inflammatory changes.
There is a small hiatal hernia. No evidence of bowel obstruction or
active inflammation. Normal appendix.

The abdominal aorta and IVC appear unremarkable. No portal venous
gas identified. There is no adenopathy. Small bilateral fat
containing inguinal hernias. The abdominal wall soft tissues appear
unremarkable. There is osteopenia with degenerative changes of the
spine. There is compression deformity of the L3 vertebra with
approximately 50% loss of vertebral body height, likely chronic. No
definite cyst acute fracture.
IMPRESSION: A 5 mm left UPJ stone with mild left hydronephrosis. A 7 mm
nonobstructing left renal interpolar calculus.

Diverticulosis. No evidence of bowel obstruction or active
inflammation. Normal appendix.

## 2016-10-12 ENCOUNTER — Encounter: Payer: PRIVATE HEALTH INSURANCE | Admitting: Family Medicine

## 2016-10-18 ENCOUNTER — Other Ambulatory Visit: Payer: Self-pay | Admitting: Family Medicine

## 2016-11-16 ENCOUNTER — Other Ambulatory Visit: Payer: Self-pay | Admitting: Family Medicine

## 2016-11-25 ENCOUNTER — Ambulatory Visit (INDEPENDENT_AMBULATORY_CARE_PROVIDER_SITE_OTHER): Payer: Medicare Other | Admitting: Podiatry

## 2016-11-25 ENCOUNTER — Ambulatory Visit (INDEPENDENT_AMBULATORY_CARE_PROVIDER_SITE_OTHER): Payer: Medicare Other

## 2016-11-25 ENCOUNTER — Encounter: Payer: Self-pay | Admitting: Podiatry

## 2016-11-25 VITALS — BP 108/61 | HR 92 | Ht 70.0 in | Wt 230.0 lb

## 2016-11-25 DIAGNOSIS — M7662 Achilles tendinitis, left leg: Secondary | ICD-10-CM | POA: Diagnosis not present

## 2016-11-25 DIAGNOSIS — M79672 Pain in left foot: Secondary | ICD-10-CM

## 2016-11-25 DIAGNOSIS — M79671 Pain in right foot: Secondary | ICD-10-CM | POA: Diagnosis not present

## 2016-11-25 DIAGNOSIS — D169 Benign neoplasm of bone and articular cartilage, unspecified: Secondary | ICD-10-CM

## 2016-11-25 DIAGNOSIS — L84 Corns and callosities: Secondary | ICD-10-CM

## 2016-11-25 NOTE — Progress Notes (Signed)
Subjective:     Patient ID: [name] [name], male   DOB: [birth year], 75 y.o.   MRN: [id number]  HPI patient states she's developed a lot of pain in the fifth toes of both feet and it started a couple months ago when he changed his shoes   Review of Systems  All other systems reviewed and are negative.      Objective:   Physical Exam  Constitutional: He is oriented to person, place, and time.  Cardiovascular: Intact distal pulses.   Musculoskeletal: Normal range of motion.  Neurological: He is oriented to person, place, and time.  Skin: Skin is warm.  Nursing note and vitals reviewed.  neurovascular status found to be intact muscle strength was adequate range of motion within normal limits with patient found to have significant rotation digits 5 bilateral with distal medial keratotic lesions and exostotic lesion right over left. Patient is also noted to have significant structural bunion deformity which been present for a number years bilateral and did have good digital perfusion and is well oriented 3     Assessment:     Exostosis fifth digit bilateral with rotated fifth toes keratotic lesion formation and also noted to have structural bunion deformity bilateral that are moderately painful when pressed    Plan:     H&P x-rays reviewed and at this time I recommended debridement padding with consideration of exostectomy long-term. We will see how long this last and today debridement was a copy no iatrogenic bleeding and padding applied  X-rayed indicate rotation of the fifth toes against the fourth toes bilateral with structural deformity of the first MPJ bilateral

## 2016-12-07 ENCOUNTER — Ambulatory Visit (INDEPENDENT_AMBULATORY_CARE_PROVIDER_SITE_OTHER): Payer: Medicare Other | Admitting: Family Medicine

## 2016-12-07 VITALS — BP 132/80 | HR 81 | Temp 97.5°F | Wt 241.2 lb

## 2016-12-07 DIAGNOSIS — Z0001 Encounter for general adult medical examination with abnormal findings: Secondary | ICD-10-CM

## 2016-12-07 DIAGNOSIS — R030 Elevated blood-pressure reading, without diagnosis of hypertension: Secondary | ICD-10-CM

## 2016-12-07 DIAGNOSIS — Z0000 Encounter for general adult medical examination without abnormal findings: Secondary | ICD-10-CM

## 2016-12-07 DIAGNOSIS — R001 Bradycardia, unspecified: Secondary | ICD-10-CM

## 2016-12-07 DIAGNOSIS — Z9989 Dependence on other enabling machines and devices: Secondary | ICD-10-CM

## 2016-12-07 DIAGNOSIS — G4733 Obstructive sleep apnea (adult) (pediatric): Secondary | ICD-10-CM | POA: Diagnosis not present

## 2016-12-07 DIAGNOSIS — K219 Gastro-esophageal reflux disease without esophagitis: Secondary | ICD-10-CM | POA: Diagnosis not present

## 2016-12-07 DIAGNOSIS — I890 Lymphedema, not elsewhere classified: Secondary | ICD-10-CM | POA: Diagnosis not present

## 2016-12-07 DIAGNOSIS — C4359 Malignant melanoma of other part of trunk: Secondary | ICD-10-CM

## 2016-12-07 MED ORDER — CIPROFLOXACIN HCL 500 MG PO TABS: 500.0000 mg | ORAL_TABLET | Freq: Two times a day (BID) | ORAL | 0 refills | Status: DC

## 2016-12-07 MED ORDER — GABAPENTIN 300 MG PO CAPS: 900.0000 mg | ORAL_CAPSULE | Freq: Three times a day (TID) | ORAL | 3 refills | Status: DC

## 2016-12-07 NOTE — Progress Notes (Signed)
Pre visit review using our clinic review tool, if applicable. No additional management support is needed unless otherwise documented below in the visit note. 

## 2016-12-07 NOTE — Progress Notes (Signed)
Patient ID: [name] [name], male   DOB: [birth year], 75 y.o.   MRN: [id number]   Subjective:    Patient ID: [name] [name], male    DOB: May 10, 1941, 75 y.o.   MRN: [id number]  Chief Complaint  Patient presents with  . Medicare Wellness Exam    HPI Patient is in today for medicare wellness exam. Patient has no acute concerns. He is acocmpanied by his wife who confirms he is doing well. Continues to struggle with swelling in right upper extremity and ongoing pain in right shoulder and arm but it is manageable with Gabapentin. He reports he is doing well with ADLs at home. No recent fall or acute injury. He has had a lesion removed from his left arm by his dermatologist, Dr Prudencio Burly that he thinks might have been a [place] but he is not sure. Denies CP/palp/SOB/HA/congestion/fevers/GI or GU c/o. Taking meds as prescribed  Past Medical History:  Diagnosis Date  . BPH (benign prostatic hypertrophy)   . Bradycardia 12/30/2014   HR routinely in mid 40s-50s  . Coronary artery disease (CAD) excluded April 2016   False-positive nuclear stress test suggesting inferior ischemia  . Elevated PSA   . Encounter for Medicare annual wellness exam 09/22/2013   Sees Dr Wilhemina Bonito of Derm Sees Dr Silvano Rusk of Gastroenterology Sees Dr Kathie Rhodes of [facility] Sees Dr Susa Day of Optometry  . Finger pain, left 04/12/2016   4th   . GERD (gastroesophageal reflux disease)    controlled w/ diet and behavioral changes  . Hypertriglyceridemia 03/16/2015  . Lymphadenitis   . Melanoma of back ([place])    Excised Dr Wilhemina Bonito  . New onset a-fib ([place]) 03/10/2015  . Otitis, externa, infective 08/10/2015  . Shingles 07/09/2013  . Sleep apnea 07/30/2011   cpap- 13   . Snoring disorder 07/30/2011  . White coat hypertension     Past Surgical History:  Procedure Laterality Date  . LEFT HEART CATHETERIZATION WITH CORONARY ANGIOGRAM N/A 04/10/2015   Procedure: LEFT HEART CATHETERIZATION WITH  CORONARY ANGIOGRAM;  Surgeon:  Leonie Man, MD;  Location: [facility] CATH LAB;  Service: Cardiovascular;  Angiographicallly NORMAL CORONARY ARTERIES  . MASS EXCISION N/A 05/16/2014   Procedure: EXCISION POSTERIOR NECK MASS, RIGHT CHEST WALL MASS AND RIGHT AXILLARY MASS AXILLARY NODE DISSECTION;  Surgeon: Adin Hector, MD;  Location: [facility] ORS;  Service: General;  Laterality: N/A;  . NM MYOVIEW LTD  03/19/2015   FALSE POSITIVE:  INTERMEDIATE RISK. Small sized, moderate intensity inferior ischemic perfusion defect  . TRANSTHORACIC ECHOCARDIOGRAM  03/20/2015   Normal LV size and function. EF 60-65%. G1 DD. Trivial AI. Borderline aortic root dilation (41 mm), Mild LA dilation.    Family History  Problem Relation Age of Onset  . Cancer Mother     MM, leukemia  . Emphysema Father   . Cancer Sister     brain  . Alcohol abuse Other   . COPD Brother   . Heart disease Paternal Grandmother   . COPD Paternal Grandfather   . Diabetes Sister   . Obesity Sister   . Down syndrome Brother   . Colon cancer Neg Hx     Social History   Social History  . Marital status: Married    Spouse name: [name]  . Number of children: 2  . Years of education: N/A   Occupational History  . retired     Administrator   Social History Main Topics  . Smoking  status: Former Smoker    Quit date: 02/11/1989  . Smokeless tobacco: Never Used  . Alcohol use 4.2 oz/week    7 Glasses of wine per week     Comment: 1 per day -- brandy or wine  . Drug use: No  . Sexual activity: Not on file     Comment: lives with wife, retired from [employer] driving, no dietary restrictions   Other Topics Concern  . Not on file   Social History Narrative   He is a married father of 2, and grandfather of 57. He has been married to his wife [name] for 51 years. He previously lived in [place] but has moved with his wife to [place], [place] to be close to his daughter [name] and her family. [name] is our Nurse, adult.   He previously worked as a Administrator  and had 2 years of college after high school. He quit smoking in 1990. He has 6-7 glasses of brandy or wine a week.    He exercises regularly with low impact aerobics 2 days a week and daily walks of 30-45 minutes. His exercise sessions or 60 minutes. He will do some type of exercise at least 7 days a week and is very active doing yard work and wood work.    Outpatient Medications Prior to Visit  Medication Sig Dispense Refill  . aspirin 81 MG chewable tablet Chew 81 mg by mouth every Monday, Wednesday, and Friday.     . Cholecalciferol (VITAMIN D) 2000 UNITS CAPS Take 1 capsule by mouth daily.    . diazepam (VALIUM) 5 MG tablet Take 0.5-2 tablets (2.5-10 mg total) by mouth every evening. As needed 30 tablet 1  . HYDROcodone-acetaminophen (NORCO/VICODIN) 5-325 MG tablet Take 1 tablet by mouth every 6 (six) hours as needed for moderate pain. 15 tablet 0  . KRILL OIL PO Take 1 capsule by mouth daily.    . Multiple Vitamin (MULTIVITAMIN WITH MINERALS) TABS tablet Take 1 tablet by mouth daily.    . promethazine (PHENERGAN) 25 MG tablet Take 1 tablet (25 mg total) by mouth every 8 (eight) hours as needed for nausea or vomiting. (Patient not taking: Reported on 12/07/2016) 15 tablet 0  . gabapentin (NEURONTIN) 300 MG capsule TAKE THREE CAPSULES BY MOUTH THREE TIMES DAILY 270 capsule 0   No facility-administered medications prior to visit.     No Known Allergies  Review of Systems  Constitutional: Negative for fever.  Eyes: Negative for blurred vision.  Respiratory: Negative for cough and shortness of breath.   Cardiovascular: Negative for chest pain and palpitations.  Gastrointestinal: Negative for vomiting.  Musculoskeletal: Negative for back pain.       Edema right upper extremity intermittently  Skin: Negative for rash.  Neurological: Negative for loss of consciousness and headaches.       Objective:    Physical Exam  Constitutional: He is oriented to person, place, and time. He  appears well-developed and well-nourished. No distress.  HENT:  Head: Normocephalic and atraumatic.  Eyes: Conjunctivae are normal.  Neck: Normal range of motion. No thyromegaly present.  Cardiovascular: Normal rate and regular rhythm.   Pulmonary/Chest: Effort normal and breath sounds normal. He has no wheezes.  Abdominal: Soft. Bowel sounds are normal. There is no tenderness.  Musculoskeletal: Normal range of motion. He exhibits edema. He exhibits no deformity.  Right upper extremity mildly edematous.   Neurological: He is alert and oriented to person, place, and time.  Skin: Skin is warm  and dry. He is not diaphoretic.  Psychiatric: He has a normal mood and affect.    BP 132/80 (BP Location: Left Arm, Patient Position: Sitting, Cuff Size: Large)   Pulse 81   Temp 97.5 F (36.4 C) (Oral)   Wt 241 lb 3.2 oz (109.4 kg)   SpO2 97%   BMI 34.61 kg/m  Wt Readings from Last 3 Encounters:  12/07/16 241 lb 3.2 oz (109.4 kg)  11/25/16 230 lb (104.3 kg)  06/19/16 230 lb (104.3 kg)     Lab Results  Component Value Date   WBC 8.4 12/07/2016   HGB 15.4 12/07/2016   HCT 44.8 12/07/2016   PLT 185.0 12/07/2016   GLUCOSE 85 12/07/2016   CHOL 148 04/12/2016   TRIG 105.0 04/12/2016   HDL 45.10 04/12/2016   LDLCALC 82 04/12/2016   ALT 21 12/07/2016   AST 18 12/07/2016   NA 141 12/07/2016   K 4.2 12/07/2016   CL 105 12/07/2016   CREATININE 1.01 12/07/2016   BUN 20 12/07/2016   CO2 29 12/07/2016   TSH 1.80 12/07/2016   PSA 4.94 (H) 01/15/2010   INR 1.22 04/04/2015    Lab Results  Component Value Date   TSH 1.80 12/07/2016   Lab Results  Component Value Date   WBC 8.4 12/07/2016   HGB 15.4 12/07/2016   HCT 44.8 12/07/2016   MCV 93.4 12/07/2016   PLT 185.0 12/07/2016   Lab Results  Component Value Date   NA 141 12/07/2016   K 4.2 12/07/2016   CO2 29 12/07/2016   GLUCOSE 85 12/07/2016   BUN 20 12/07/2016   CREATININE 1.01 12/07/2016   BILITOT 0.5 12/07/2016    ALKPHOS 50 12/07/2016   AST 18 12/07/2016   ALT 21 12/07/2016   PROT 6.8 12/07/2016   ALBUMIN 4.1 12/07/2016   CALCIUM 9.5 12/07/2016   GFR 76.77 12/07/2016   Lab Results  Component Value Date   CHOL 148 04/12/2016   Lab Results  Component Value Date   HDL 45.10 04/12/2016   Lab Results  Component Value Date   LDLCALC 82 04/12/2016   Lab Results  Component Value Date   TRIG 105.0 04/12/2016   Lab Results  Component Value Date   CHOLHDL 3 04/12/2016   No results found for: HGBA1C I acted as a Education administrator for Dr. Charlett Blake. [name], RMA     Assessment & Plan:   Problem List Items Addressed This Visit    OSA on CPAP (Chronic)    Uses cpap nightly      Bradycardia - Primary (Chronic)    RRR today      Relevant Orders   CBC (Completed)   TSH (Completed)   Comprehensive metabolic panel (Completed)   GERD    Avoid offending foods, start probiotics. Do not eat large meals in late evening and consider raising head of bed.       ELEVATED BP READING WITHOUT DX HYPERTENSION    no changes to meds. Encouraged heart healthy diet such as the DASH diet and exercise as tolerated.       Melanoma of back [facility])    Follows with dermatology doing well but did have a lesion removed from left arm he thinks was a BCC      Relevant Medications   ciprofloxacin (CIPRO) 500 MG tablet   Medicare annual wellness visit, subsequent    Patient denies any difficulties at home. No trouble with ADLs, depression or falls. See EMR for functional status  screen and depression screen. No recent changes to vision or hearing. Is UTD with immunizations. Is UTD with screening. Discussed Advanced Directives. Encouraged heart healthy diet, exercise as tolerated and adequate sleep. See patient's problem list for health risk factors to monitor. See AVS for preventative healthcare recommendation schedule.      Lymphedema    Continues to manage his lymphedema with compressive sleeve, exercises and staying  active.       Relevant Orders   CBC (Completed)   TSH (Completed)   Comprehensive metabolic panel (Completed)      I have changed Mr. Imperato's gabapentin. I am also having him start on ciprofloxacin. Additionally, I am having him maintain his aspirin, multivitamin with minerals, KRILL OIL PO, Vitamin D, diazepam, promethazine, and HYDROcodone-acetaminophen.  Meds ordered this encounter  Medications  . gabapentin (NEURONTIN) 300 MG capsule    Sig: Take 3 capsules (900 mg total) by mouth 3 (three) times daily.    Dispense:  270 capsule    Refill:  3    Please consider 90 day supplies to promote better adherence  . ciprofloxacin (CIPRO) 500 MG tablet    Sig: Take 1 tablet (500 mg total) by mouth 2 (two) times daily.    Dispense:  20 tablet    Refill:  0   CMA served as scribe during this visit. History, Physical and Plan performed by medical provider. Documentation and orders reviewed and attested to.   [name], MD

## 2016-12-07 NOTE — Assessment & Plan Note (Addendum)
Follows with dermatology doing well but did have a lesion removed from left arm he thinks was a BCC

## 2016-12-07 NOTE — Assessment & Plan Note (Addendum)
no changes to meds. Encouraged heart healthy diet such as the DASH diet and exercise as tolerated.  

## 2016-12-07 NOTE — Assessment & Plan Note (Signed)
Uses cpap nightly  

## 2016-12-07 NOTE — Patient Instructions (Addendum)
NOW company probiotic 1 cap daily can get at [employer] Vitamin C 500 to 1000 mg Coldeeze helps colds  Preventive Care 73 Years and Older, Male Preventive care refers to lifestyle choices and visits with your health care provider that can promote health and wellness. What does preventive care include?  A yearly physical exam. This is also called an annual well check.  Dental exams once or twice a year.  Routine eye exams. Ask your health care provider how often you should have your eyes checked.  Personal lifestyle choices, including:  Daily care of your teeth and gums.  Regular physical activity.  Eating a healthy diet.  Avoiding tobacco and drug use.  Limiting alcohol use.  Practicing safe sex.  Taking low doses of aspirin every day.  Taking vitamin and mineral supplements as recommended by your health care provider. What happens during an annual well check? The services and screenings done by your health care provider during your annual well check will depend on your age, overall health, lifestyle risk factors, and family history of disease. Counseling  Your health care provider may ask you questions about your:  Alcohol use.  Tobacco use.  Drug use.  Emotional well-being.  Home and relationship well-being.  Sexual activity.  Eating habits.  History of falls.  Memory and ability to understand (cognition).  Work and work Statistician. Screening  You may have the following tests or measurements:  Height, weight, and BMI.  Blood pressure.  Lipid and cholesterol levels. These may be checked every 5 years, or more frequently if you are over 29 years old.  Skin check.  Lung cancer screening. You may have this screening every year starting at age 64 if you have a 30-pack-year history of smoking and currently smoke or have quit within the past 15 years.  Fecal occult blood test (FOBT) of the stool. You may have this test every year starting at age  29.  Flexible sigmoidoscopy or colonoscopy. You may have a sigmoidoscopy every 5 years or a colonoscopy every 10 years starting at age 69.  Prostate cancer screening. Recommendations will vary depending on your family history and other risks.  Hepatitis C blood test.  Hepatitis B blood test.  Sexually transmitted disease (STD) testing.  Diabetes screening. This is done by checking your blood sugar (glucose) after you have not eaten for a while (fasting). You may have this done every 1-3 years.  Abdominal aortic aneurysm (AAA) screening. You may need this if you are a current or former smoker.  Osteoporosis. You may be screened starting at age 65 if you are at high risk. Talk with your health care provider about your test results, treatment options, and if necessary, the need for more tests. Vaccines  Your health care provider may recommend certain vaccines, such as:  Influenza vaccine. This is recommended every year.  Tetanus, diphtheria, and acellular pertussis (Tdap, Td) vaccine. You may need a Td booster every 10 years.  Varicella vaccine. You may need this if you have not been vaccinated.  Zoster vaccine. You may need this after age 67.  Measles, mumps, and rubella (MMR) vaccine. You may need at least one dose of MMR if you were born in 1957 or later. You may also need a second dose.  Pneumococcal 13-valent conjugate (PCV13) vaccine. One dose is recommended after age 48.  Pneumococcal polysaccharide (PPSV23) vaccine. One dose is recommended after age 20.  Meningococcal vaccine. You may need this if you have certain conditions.  Hepatitis  A vaccine. You may need this if you have certain conditions or if you travel or work in places where you may be exposed to hepatitis A.  Hepatitis B vaccine. You may need this if you have certain conditions or if you travel or work in places where you may be exposed to hepatitis B.  Haemophilus influenzae type b (Hib) vaccine. You may  need this if you have certain risk factors. Talk to your health care provider about which screenings and vaccines you need and how often you need them. This information is not intended to replace advice given to you by your health care provider. Make sure you discuss any questions you have with your health care provider. Document Released: 01/09/2016 Document Revised: 09/01/2016 Document Reviewed: 10/14/2015 Elsevier Interactive Patient Education  2017 [employer].

## 2016-12-07 NOTE — Assessment & Plan Note (Signed)
Patient denies any difficulties at home. No trouble with ADLs, depression or falls. See EMR for functional status screen and depression screen. No recent changes to vision or hearing. Is UTD with immunizations. Is UTD with screening. Discussed Advanced Directives. Encouraged heart healthy diet, exercise as tolerated and adequate sleep. See patient's problem list for health risk factors to monitor. See AVS for preventative healthcare recommendation schedule. 

## 2016-12-08 LAB — COMPREHENSIVE METABOLIC PANEL
ALK PHOS: 50 U/L (ref 39–117)
ALT: 21 U/L (ref 0–53)
AST: 18 U/L (ref 0–37)
Albumin: 4.1 g/dL (ref 3.5–5.2)
BILIRUBIN TOTAL: 0.5 mg/dL (ref 0.2–1.2)
BUN: 20 mg/dL (ref 6–23)
CALCIUM: 9.5 mg/dL (ref 8.4–10.5)
CO2: 29 meq/L (ref 19–32)
Chloride: 105 mEq/L (ref 96–112)
Creatinine, Ser: 1.01 mg/dL (ref 0.40–1.50)
GFR: 76.77 mL/min (ref >60.00)
Glucose, Bld: 85 mg/dL (ref 70–99)
POTASSIUM: 4.2 meq/L (ref 3.5–5.1)
Sodium: 141 mEq/L (ref 135–145)
TOTAL PROTEIN: 6.8 g/dL (ref 6.0–8.3)

## 2016-12-08 LAB — CBC
HCT: 44.8 % (ref 39.0–52.0)
HEMOGLOBIN: 15.4 g/dL (ref 13.0–17.0)
MCHC: 34.3 g/dL (ref 30.0–36.0)
MCV: 93.4 fl (ref 78.0–100.0)
PLATELETS: 185 10*3/uL (ref 150.0–400.0)
RBC: 4.8 Mil/uL (ref 4.22–5.81)
RDW: 14.1 % (ref 11.5–15.5)
WBC: 8.4 10*3/uL (ref 4.0–10.5)

## 2016-12-08 LAB — TSH: TSH: 1.8 u[IU]/mL (ref 0.35–4.50)

## 2016-12-24 DIAGNOSIS — Z85820 Personal history of malignant melanoma of skin: Secondary | ICD-10-CM | POA: Diagnosis not present

## 2016-12-24 DIAGNOSIS — L720 Epidermal cyst: Secondary | ICD-10-CM | POA: Diagnosis not present

## 2016-12-24 DIAGNOSIS — D485 Neoplasm of uncertain behavior of skin: Secondary | ICD-10-CM | POA: Diagnosis not present

## 2016-12-27 HISTORY — PX: COLONOSCOPY: SHX174

## 2016-12-28 NOTE — Assessment & Plan Note (Signed)
Continues to manage his lymphedema with compressive sleeve, exercises and staying active.

## 2016-12-28 NOTE — Assessment & Plan Note (Signed)
Avoid offending foods, start probiotics. Do not eat large meals in late evening and consider raising head of bed.  

## 2016-12-28 NOTE — Assessment & Plan Note (Signed)
RRR today 

## 2017-01-10 ENCOUNTER — Encounter: Payer: PRIVATE HEALTH INSURANCE | Admitting: Family Medicine

## 2017-02-23 ENCOUNTER — Ambulatory Visit (INDEPENDENT_AMBULATORY_CARE_PROVIDER_SITE_OTHER): Payer: Medicare Other | Admitting: Podiatry

## 2017-02-23 ENCOUNTER — Encounter: Payer: Self-pay | Admitting: Podiatry

## 2017-02-23 DIAGNOSIS — D169 Benign neoplasm of bone and articular cartilage, unspecified: Secondary | ICD-10-CM

## 2017-02-23 DIAGNOSIS — L84 Corns and callosities: Secondary | ICD-10-CM | POA: Diagnosis not present

## 2017-02-23 NOTE — Progress Notes (Signed)
Subjective:     Patient ID: [name] [name], male   DOB: [birth year], 76 y.o.   MRN: [id number]  HPI patient presents stating these corns are returned and there is so sore and I cannot walk or wear shoe gear comfortably   Review of Systems     Objective:   Physical Exam Neurovascular status intact with keratotic lesions on the inside of the fifth digit bilateral that are painful when pressed with mild rotation of the toe    Assessment:     Chronic keratotic lesion inside fifth digits bilateral    Plan:     H&P conditions reviewed and recommended excision. Today I went ahead and explained the procedure to fix this and allowed him to read a consent form going over alternative treatments complications. Patient wants surgery and will have 1 foot done to time and will have the left one done first in the office and was given all preoperative instructions. Today debridement of lesions accomplished

## 2017-03-16 ENCOUNTER — Encounter: Payer: Self-pay | Admitting: Podiatry

## 2017-03-16 ENCOUNTER — Ambulatory Visit (INDEPENDENT_AMBULATORY_CARE_PROVIDER_SITE_OTHER): Payer: Medicare Other | Admitting: Podiatry

## 2017-03-16 VITALS — BP 127/73 | HR 76 | Temp 97.0°F | Resp 16

## 2017-03-16 DIAGNOSIS — D169 Benign neoplasm of bone and articular cartilage, unspecified: Secondary | ICD-10-CM

## 2017-03-16 NOTE — Progress Notes (Signed)
Subjective:     Patient ID: [name] [name], male   DOB: Jun 17, 1941, 76 y.o.   MRN: [id number]  Patient presents with painful lesion bilateral stating the right has been hurting him more and he wants that one fixed today     Review of Systems     Objective:   Physical Exam Neurovascular status intact muscle strength adequate range of motion within normal limits with patient's right fifth digit showing more spur formation with keratotic tissue than the left at this time    Assessment:     Keratotic lesion chronic in nature fifth digit right and left    Plan:     Reviewed condition and we will focus on the right today. I went ahead and I discuss surgical intervention and numbing the toe with 60 g like Marcaine mixture and her surgical suite and the patient's foot was prepped and draped utilizing standard aseptic technique. The foot was then prepared for me and I made a some elliptical incision around the offending keratotic tissue removed tissue down to bone and utilizing a sidecutting Christmas bur I took the lesion down to reduce the pressure and sutured with 5-0 nylon. Applied sterile dressing instructed on elevation immobilization and patient left the OR in satisfactory condition

## 2017-03-31 ENCOUNTER — Ambulatory Visit (INDEPENDENT_AMBULATORY_CARE_PROVIDER_SITE_OTHER): Payer: Self-pay | Admitting: Podiatry

## 2017-03-31 ENCOUNTER — Ambulatory Visit (INDEPENDENT_AMBULATORY_CARE_PROVIDER_SITE_OTHER): Payer: Medicare Other

## 2017-03-31 ENCOUNTER — Encounter: Payer: Self-pay | Admitting: Podiatry

## 2017-03-31 VITALS — Temp 96.2°F

## 2017-03-31 DIAGNOSIS — Z9889 Other specified postprocedural states: Secondary | ICD-10-CM | POA: Diagnosis not present

## 2017-03-31 DIAGNOSIS — D169 Benign neoplasm of bone and articular cartilage, unspecified: Secondary | ICD-10-CM

## 2017-04-04 NOTE — Progress Notes (Signed)
Subjective: [name] [name] is a 76 y.o. is seen today in office s/p right 5th exostectomy preformed on 03/16/2017 with Dr. Paulla Dolly. He presents today for suture removal. They state their pain is contolled. Denies any systemic complaints such as fevers, chills, nausea, vomiting. No calf pain, chest pain, shortness of breath.   Objective: General: No acute distress, AAOx3  DP/PT pulses palpable 2/4, CRT < 3 sec to all digits.  Protective sensation intact. Motor function intact.  Right foot: Incision is well coapted without any evidence of dehiscence with sutures intact. It is very macerated tissue on the interspace. There is no surrounding erythema, ascending cellulitis, fluctuance, crepitus, malodor, drainage/purulence. There is mild edema around the surgical site. There is mild pain along the surgical site.  No other areas of tenderness to bilateral lower extremities.  No other open lesions or pre-ulcerative lesions.  No pain with calf compression, swelling, warmth, erythema.   Assessment and Plan:  Status post right 5th digit exostectomy, doing well with no complications   -Treatment options discussed including all alternatives, risks, and complications -X-rays were obtained and reviewed with the patient. Status post excess of the fifth digit. -Betadine was painted on the interspace as well as a dry dressing. Recommend continue with this digit macerated tissue. -Ice/elevation -Pain medication as needed. -Monitor for any clinical signs or symptoms of infection and DVT/PE and directed to call the office immediately should any occur or go to the ER. -Follow-up as scheduled or sooner if any problems arise. In the meantime, encouraged to call the office with any questions, concerns, change in symptoms.   Celesta Gentile, DPM

## 2017-04-05 ENCOUNTER — Ambulatory Visit: Payer: Medicare Other | Admitting: Cardiology

## 2017-04-07 ENCOUNTER — Ambulatory Visit (INDEPENDENT_AMBULATORY_CARE_PROVIDER_SITE_OTHER): Payer: Medicare Other | Admitting: Podiatry

## 2017-04-07 ENCOUNTER — Ambulatory Visit (INDEPENDENT_AMBULATORY_CARE_PROVIDER_SITE_OTHER): Payer: Medicare Other

## 2017-04-07 DIAGNOSIS — D169 Benign neoplasm of bone and articular cartilage, unspecified: Secondary | ICD-10-CM | POA: Diagnosis not present

## 2017-04-07 DIAGNOSIS — Z9889 Other specified postprocedural states: Secondary | ICD-10-CM | POA: Diagnosis not present

## 2017-04-07 NOTE — Patient Instructions (Signed)

## 2017-04-10 NOTE — Progress Notes (Signed)
Subjective:     Patient ID: [name] [name], male   DOB: 1941/01/20, 76 y.o.   MRN: [id number]  HPI patient states he's done well after having exostectomy fifth digit. States he wants the other one fixed   Review of Systems     Objective:   Physical Exam Neurovascular status intact with patient having excellent healing with crusted tissue the right fifth digit with a offending keratotic lesion fifth digit left that's very painful when pressed    Assessment:     Doing well post exostectomy fifth digit right with keratotic lesion fifth digit left that's painful    Plan:     H&P condition reviewed and for the right one and he will resume normal activity. For the left one I allowed him to read consent form for exostectomy explaining procedure and risk. Patient wants surgery and is scheduled for office surgery in the next several weeks and all questions were answered

## 2017-04-14 ENCOUNTER — Other Ambulatory Visit: Payer: Self-pay | Admitting: Family Medicine

## 2017-04-20 ENCOUNTER — Ambulatory Visit (INDEPENDENT_AMBULATORY_CARE_PROVIDER_SITE_OTHER): Payer: Medicare Other | Admitting: Podiatry

## 2017-04-20 ENCOUNTER — Encounter: Payer: Self-pay | Admitting: Podiatry

## 2017-04-20 VITALS — BP 121/76 | HR 72 | Temp 96.1°F | Resp 16

## 2017-04-20 DIAGNOSIS — D169 Benign neoplasm of bone and articular cartilage, unspecified: Secondary | ICD-10-CM | POA: Diagnosis not present

## 2017-04-20 NOTE — Progress Notes (Signed)
Subjective:    Patient ID: [name] [name], male   DOB: 76 y.o.   MRN: [id number]   HPI patient presents stating the corn on the inside of his left fifth toe is very sore and he cannot wear shoe gear    ROS      Objective:  Physical Exam Neurovascular status intact with patient noted to have keratotic lesion medial side left fifth digits that's painful    Assessment:     Chronic lesion left fifth toe with pain and inability to wear shoe gear without difficulty with good correction of right    Plan:     Patient was anesthetized with 60 [place] times like Marcaine mixture and the patient was brought to the OR and a sterile prep was applied to the foot and the tourniquet was inflated to 250 mmHg. Attention directed medial side fifth digit and a small some elliptical incision was made over the offending lesion. This was taken down to bone and the offending lesion was removed and the bone was exposed. I then went ahead and utilizing a Christmas spur I rasped the spur smooth on the fifth toe I removed all bone paste flushed the wound and sutured with 5-0 nylon. Applied sterile dressing released tourniquet and capillary refill was noted to be immediate to all digits

## 2017-05-04 ENCOUNTER — Ambulatory Visit (INDEPENDENT_AMBULATORY_CARE_PROVIDER_SITE_OTHER): Payer: Medicare Other

## 2017-05-04 ENCOUNTER — Ambulatory Visit (INDEPENDENT_AMBULATORY_CARE_PROVIDER_SITE_OTHER): Payer: Self-pay | Admitting: Podiatry

## 2017-05-04 ENCOUNTER — Ambulatory Visit: Payer: Medicare Other

## 2017-05-04 ENCOUNTER — Encounter: Payer: Self-pay | Admitting: Podiatry

## 2017-05-04 DIAGNOSIS — D169 Benign neoplasm of bone and articular cartilage, unspecified: Secondary | ICD-10-CM

## 2017-05-04 DIAGNOSIS — Z9889 Other specified postprocedural states: Secondary | ICD-10-CM

## 2017-05-04 NOTE — Progress Notes (Signed)
Subjective:    Patient ID: [name] [name], male   DOB: 76 y.o.   MRN: [id number]   HPI patient states is doing just like the right foot and I'm having minimal discomfort    ROS      Objective:  Physical Exam Neurovascular status intact with some irritated tissue on the left fifth digit with stitches intact wound edges coapted    Assessment:    Doing well overall exostectomy fifth digit left     Plan:     Stitches removed and instructed on gradual soaks and applied Iodosorb to try to dry the area out. Patient will be seen back to recheck   X-rays indicated satisfactory section of bone

## 2017-05-09 DIAGNOSIS — D1801 Hemangioma of skin and subcutaneous tissue: Secondary | ICD-10-CM | POA: Diagnosis not present

## 2017-05-09 DIAGNOSIS — L821 Other seborrheic keratosis: Secondary | ICD-10-CM | POA: Diagnosis not present

## 2017-05-09 DIAGNOSIS — Z85820 Personal history of malignant melanoma of skin: Secondary | ICD-10-CM | POA: Diagnosis not present

## 2017-05-09 DIAGNOSIS — I8392 Asymptomatic varicose veins of left lower extremity: Secondary | ICD-10-CM | POA: Diagnosis not present

## 2017-05-18 ENCOUNTER — Ambulatory Visit (INDEPENDENT_AMBULATORY_CARE_PROVIDER_SITE_OTHER): Payer: Self-pay | Admitting: Podiatry

## 2017-05-18 ENCOUNTER — Ambulatory Visit: Payer: Medicare Other

## 2017-05-18 ENCOUNTER — Encounter: Payer: Self-pay | Admitting: Podiatry

## 2017-05-18 DIAGNOSIS — D169 Benign neoplasm of bone and articular cartilage, unspecified: Secondary | ICD-10-CM

## 2017-05-20 NOTE — Progress Notes (Signed)
Subjective:    Patient ID: [name] [name], male   DOB: 76 y.o.   MRN: [id number]   HPI patient presents stating he's doing great    ROS      Objective:  Physical Exam Neurovascular status intact with exostotic lesion left that's doing well after resection    Assessment:    Doing well post digital resection left with skin healing well     Plan:    Stitches removed wound edges well coapted and Band-Aid applied and return to normal activity and will be seen back as needed

## 2017-05-20 NOTE — Progress Notes (Signed)
DOS 03/16/2017 Removal of spur(left) 5th toe

## 2017-05-27 NOTE — Progress Notes (Signed)
Office Surgery DOS 04.25.2018 Removal spur from left 5th toe.

## 2017-06-09 ENCOUNTER — Ambulatory Visit (INDEPENDENT_AMBULATORY_CARE_PROVIDER_SITE_OTHER): Payer: Medicare Other | Admitting: Family Medicine

## 2017-06-09 DIAGNOSIS — R001 Bradycardia, unspecified: Secondary | ICD-10-CM

## 2017-06-09 DIAGNOSIS — Z9989 Dependence on other enabling machines and devices: Secondary | ICD-10-CM | POA: Diagnosis not present

## 2017-06-09 DIAGNOSIS — I890 Lymphedema, not elsewhere classified: Secondary | ICD-10-CM | POA: Diagnosis not present

## 2017-06-09 DIAGNOSIS — G4733 Obstructive sleep apnea (adult) (pediatric): Secondary | ICD-10-CM | POA: Diagnosis not present

## 2017-06-09 MED ORDER — GABAPENTIN 300 MG PO CAPS: 900.0000 mg | ORAL_CAPSULE | Freq: Three times a day (TID) | ORAL | 3 refills | Status: DC

## 2017-06-09 NOTE — Patient Instructions (Signed)

## 2017-06-09 NOTE — Progress Notes (Signed)
Subjective:  I acted as a Education administrator for Dr. Charlett Blake. [name], [place]  Patient ID: [name] [name], male    DOB: March 12, 1941, 76 y.o.   MRN: [id number]  Chief Complaint  Patient presents with  . Follow-up    HPI  Patient is in today for 6 month follow up. He is accompanied by his wife. They report overall he is doing well. No recent febrile illness or hospitalization. Is using CPAP nightly and resting well. Stays active and maintains a heart healthy diet. Denies CP/palp/SOB/HA/congestion/fevers/GI or GU c/o. Taking meds as prescribed  Patient Care Team: Mosie Lukes, MD as PCP - General (Family Medicine) Gatha Mayer, MD as Consulting Physician (Gastroenterology) Danella Sensing, MD as Consulting Physician (Dermatology) Kathie Rhodes, MD as Consulting Physician (Urology) Leonie Man, MD as Consulting Physician (Cardiology) Lynda Rainwater, DDS as Consulting Physician (Dentistry)   Past Medical History:  Diagnosis Date  . BPH (benign prostatic hypertrophy)   . Bradycardia 12/30/2014   HR routinely in mid 40s-50s  . Coronary artery disease (CAD) excluded April 2016   False-positive nuclear stress test suggesting inferior ischemia  . Elevated PSA   . Encounter for Medicare annual wellness exam 09/22/2013   Sees Dr Wilhemina Bonito of Derm Sees Dr Silvano Rusk of Gastroenterology Sees Dr Kathie Rhodes of [facility] Sees Dr Susa Day of Optometry  . Finger pain, left 04/12/2016   4th   . GERD (gastroesophageal reflux disease)    controlled w/ diet and behavioral changes  . Hypertriglyceridemia 03/16/2015  . Lymphadenitis   . Melanoma of back ([place])    Excised Dr Wilhemina Bonito  . New onset a-fib ([place]) 03/10/2015  . Otitis, externa, infective 08/10/2015  . Shingles 07/09/2013  . Sleep apnea 07/30/2011   cpap- 13   . Snoring disorder 07/30/2011  . White coat hypertension     Past Surgical History:  Procedure Laterality Date  . LEFT HEART CATHETERIZATION WITH CORONARY ANGIOGRAM N/A  04/10/2015   Procedure: LEFT HEART CATHETERIZATION WITH  CORONARY ANGIOGRAM;  Surgeon: Leonie Man, MD;  Location: [facility] CATH LAB;  Service: Cardiovascular;  Angiographicallly NORMAL CORONARY ARTERIES  . MASS EXCISION N/A 05/16/2014   Procedure: EXCISION POSTERIOR NECK MASS, RIGHT CHEST WALL MASS AND RIGHT AXILLARY MASS AXILLARY NODE DISSECTION;  Surgeon: Adin Hector, MD;  Location: [facility] ORS;  Service: General;  Laterality: N/A;  . NM MYOVIEW LTD  03/19/2015   FALSE POSITIVE:  INTERMEDIATE RISK. Small sized, moderate intensity inferior ischemic perfusion defect  . TRANSTHORACIC ECHOCARDIOGRAM  03/20/2015   Normal LV size and function. EF 60-65%. G1 DD. Trivial AI. Borderline aortic root dilation (41 mm), Mild LA dilation.    Family History  Problem Relation Age of Onset  . Cancer Mother        MM, leukemia  . Emphysema Father   . Cancer Sister        brain  . COPD Brother   . Heart disease Paternal Grandmother   . COPD Paternal Grandfather   . Diabetes Sister   . Obesity Sister   . Down syndrome Brother   . Alcohol abuse Other   . Colon cancer Neg Hx     Social History   Social History  . Marital status: Married    Spouse name: [name]  . Number of children: 2  . Years of education: N/A   Occupational History  . retired     Administrator   Social History Main Topics  .  Smoking status: Former Smoker    Quit date: 02/11/1989  . Smokeless tobacco: Never Used  . Alcohol use 4.2 oz/week    7 Glasses of wine per week     Comment: 1 per day -- brandy or wine  . Drug use: No  . Sexual activity: Not on file     Comment: lives with wife, retired from [employer] driving, no dietary restrictions   Other Topics Concern  . Not on file   Social History Narrative   He is a married father of 2, and grandfather of 65. He has been married to his wife [name] for 51 years. He previously lived in [place] but has moved with his wife to [place], [place] to be close to his daughter [name]  and her family. [name] is our Nurse, adult.   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 He quit smoking in 1990. He has 6-7 glasses of brandy or wine a week.    He exercises regularly with low impact aerobics 2 days a week and daily walks of 30-45 minutes. His exercise sessions or 60 minutes. He will do some type of exercise at least 7 days a week and is very active doing yard work and wood work.    Outpatient Medications Prior to Visit  Medication Sig Dispense Refill  . aspirin 81 MG chewable tablet Chew 81 mg by mouth every Monday, Wednesday, and Friday.     . Cholecalciferol (VITAMIN D) 2000 UNITS CAPS Take 1 capsule by mouth daily.    Marland Kitchen KRILL OIL PO Take 1 capsule by mouth daily.    . Multiple Vitamin (MULTIVITAMIN WITH MINERALS) TABS tablet Take 1 tablet by mouth daily.    Marland Kitchen gabapentin (NEURONTIN) 300 MG capsule TAKE THREE CAPSULES BY MOUTH THREE TIMES DAILY 270 capsule 3   No facility-administered medications prior to visit.     No Known Allergies  Review of Systems  Constitutional: Negative for fever and malaise/fatigue.  HENT: Negative for congestion.   Eyes: Negative for blurred vision.  Respiratory: Negative for cough and shortness of breath.   Cardiovascular: Negative for chest pain, palpitations and leg swelling.  Gastrointestinal: Negative for vomiting.  Musculoskeletal: Negative for back pain.  Skin: Negative for rash.  Neurological: Negative for loss of consciousness and headaches.       Objective:    Physical Exam  Constitutional: He is oriented to person, place, and time. He appears well-developed and well-nourished. No distress.  HENT:  Head: Normocephalic and atraumatic.  Eyes: Conjunctivae are normal.  Neck: Normal range of motion. No thyromegaly present.  Cardiovascular: Normal rate and regular rhythm.   Pulmonary/Chest: Effort normal and breath sounds normal. He has no wheezes.  Abdominal: Soft. Bowel  sounds are normal. There is no tenderness.  Musculoskeletal: Normal range of motion. He exhibits no edema or deformity.  Neurological: He is alert and oriented to person, place, and time.  Skin: Skin is warm and dry. He is not diaphoretic.  Psychiatric: He has a normal mood and affect.    BP 100/72 (BP Location: Left Arm, Patient Position: Sitting, Cuff Size: Normal)   Pulse 80   Temp 97.6 F (36.4 C) (Oral)   Resp 18   Wt 240 lb 9.6 oz (109.1 kg)   SpO2 98%   BMI 34.52 kg/m  Wt Readings from Last 3 Encounters:  06/09/17 240 lb 9.6 oz (109.1 kg)  12/07/16 241 lb 3.2 oz (109.4 kg)  11/25/16 230 lb (104.3 kg)   BP Readings from Last 3 Encounters:  06/09/17 100/72  04/20/17 121/76  03/16/17 127/73     Immunization History  Administered Date(s) Administered  . Influenza Whole 09/15/2009, 09/23/2010  . Influenza, High Dose Seasonal PF 09/21/2016  . Influenza,inj,Quad PF,36+ Mos 09/20/2013, 10/18/2014, 09/08/2015  . Pneumococcal Conjugate-13 11/19/2013  . Pneumococcal Polysaccharide-23 04/09/2003, 10/31/2015  . Td 06/04/2010    Health Maintenance  Topic Date Due  . INFLUENZA VACCINE  07/27/2017  . TETANUS/TDAP  06/04/2020  . COLONOSCOPY  12/14/2023  . PNA vac Low Risk Adult  Completed    Lab Results  Component Value Date   WBC 8.4 12/07/2016   HGB 15.4 12/07/2016   HCT 44.8 12/07/2016   PLT 185.0 12/07/2016   GLUCOSE 85 12/07/2016   CHOL 148 04/12/2016   TRIG 105.0 04/12/2016   HDL 45.10 04/12/2016   LDLCALC 82 04/12/2016   ALT 21 12/07/2016   AST 18 12/07/2016   NA 141 12/07/2016   K 4.2 12/07/2016   CL 105 12/07/2016   CREATININE 1.01 12/07/2016   BUN 20 12/07/2016   CO2 29 12/07/2016   TSH 1.80 12/07/2016   PSA 4.94 (H) 01/15/2010   INR 1.22 04/04/2015    Lab Results  Component Value Date   TSH 1.80 12/07/2016   Lab Results  Component Value Date   WBC 8.4 12/07/2016   HGB 15.4 12/07/2016   HCT 44.8 12/07/2016   MCV 93.4 12/07/2016   PLT 185.0  12/07/2016   Lab Results  Component Value Date   NA 141 12/07/2016   K 4.2 12/07/2016   CO2 29 12/07/2016   GLUCOSE 85 12/07/2016   BUN 20 12/07/2016   CREATININE 1.01 12/07/2016   BILITOT 0.5 12/07/2016   ALKPHOS 50 12/07/2016   AST 18 12/07/2016   ALT 21 12/07/2016   PROT 6.8 12/07/2016   ALBUMIN 4.1 12/07/2016   CALCIUM 9.5 12/07/2016   GFR 76.77 12/07/2016   Lab Results  Component Value Date   CHOL 148 04/12/2016   Lab Results  Component Value Date   HDL 45.10 04/12/2016   Lab Results  Component Value Date   LDLCALC 82 04/12/2016   Lab Results  Component Value Date   TRIG 105.0 04/12/2016   Lab Results  Component Value Date   CHOLHDL 3 04/12/2016   No results found for: HGBA1C       Assessment & Plan:   Problem List Items Addressed This Visit    OSA on CPAP (Chronic)    Using CPAP nightly      Bradycardia (Chronic)    RRR today      Lymphedema    Still doing well with compression sleeve.         I have changed Mr. Rautio's gabapentin. I am also having him maintain his aspirin, multivitamin with minerals, KRILL OIL PO, and Vitamin D.  Meds ordered this encounter  Medications  . gabapentin (NEURONTIN) 300 MG capsule    Sig: Take 3 capsules (900 mg total) by mouth 3 (three) times daily.    Dispense:  270 capsule    Refill:  3    Please consider 90 day supplies to promote better adherence    CMA served as scribe during this visit. History, Physical and Plan performed by medical provider. Documentation and orders reviewed and attested to.  [name], MD

## 2017-06-12 NOTE — Assessment & Plan Note (Signed)
Using CPAP nightly 

## 2017-06-12 NOTE — Assessment & Plan Note (Signed)
RRR today 

## 2017-06-12 NOTE — Assessment & Plan Note (Signed)
Still doing well with compression sleeve.

## 2017-07-28 DIAGNOSIS — R972 Elevated prostate specific antigen [PSA]: Secondary | ICD-10-CM | POA: Diagnosis not present

## 2017-08-04 DIAGNOSIS — Z87442 Personal history of urinary calculi: Secondary | ICD-10-CM | POA: Diagnosis not present

## 2017-08-04 DIAGNOSIS — R972 Elevated prostate specific antigen [PSA]: Secondary | ICD-10-CM | POA: Diagnosis not present

## 2017-08-09 ENCOUNTER — Encounter (INDEPENDENT_AMBULATORY_CARE_PROVIDER_SITE_OTHER): Payer: Medicare Other

## 2017-08-18 ENCOUNTER — Ambulatory Visit (INDEPENDENT_AMBULATORY_CARE_PROVIDER_SITE_OTHER): Payer: Medicare Other | Admitting: Family Medicine

## 2017-08-18 ENCOUNTER — Encounter (INDEPENDENT_AMBULATORY_CARE_PROVIDER_SITE_OTHER): Payer: Self-pay | Admitting: Family Medicine

## 2017-08-18 VITALS — BP 119/71 | HR 65 | Temp 97.5°F | Ht 69.0 in | Wt 237.0 lb

## 2017-08-18 DIAGNOSIS — R5383 Other fatigue: Secondary | ICD-10-CM | POA: Diagnosis not present

## 2017-08-18 DIAGNOSIS — G4733 Obstructive sleep apnea (adult) (pediatric): Secondary | ICD-10-CM | POA: Insufficient documentation

## 2017-08-18 DIAGNOSIS — Z6835 Body mass index (BMI) 35.0-35.9, adult: Secondary | ICD-10-CM

## 2017-08-18 DIAGNOSIS — R9431 Abnormal electrocardiogram [ECG] [EKG]: Secondary | ICD-10-CM | POA: Insufficient documentation

## 2017-08-18 DIAGNOSIS — R739 Hyperglycemia, unspecified: Secondary | ICD-10-CM | POA: Diagnosis not present

## 2017-08-18 DIAGNOSIS — E669 Obesity, unspecified: Secondary | ICD-10-CM | POA: Diagnosis not present

## 2017-08-18 DIAGNOSIS — Z1389 Encounter for screening for other disorder: Secondary | ICD-10-CM | POA: Diagnosis not present

## 2017-08-18 DIAGNOSIS — Z1331 Encounter for screening for depression: Secondary | ICD-10-CM

## 2017-08-18 DIAGNOSIS — Z0289 Encounter for other administrative examinations: Secondary | ICD-10-CM

## 2017-08-18 DIAGNOSIS — E782 Mixed hyperlipidemia: Secondary | ICD-10-CM

## 2017-08-18 DIAGNOSIS — E66811 Obesity, class 1: Secondary | ICD-10-CM | POA: Insufficient documentation

## 2017-08-18 DIAGNOSIS — E559 Vitamin D deficiency, unspecified: Secondary | ICD-10-CM | POA: Insufficient documentation

## 2017-08-18 DIAGNOSIS — IMO0001 Reserved for inherently not codable concepts without codable children: Secondary | ICD-10-CM

## 2017-08-18 NOTE — Progress Notes (Signed)
Office: [phone]  /  Fax: [phone]   HPI:   Chief Complaint: OBESITY  [name] [name] (MR# [id number]) is a 76 y.o. male who presents on 08/18/2017 for obesity evaluation and treatment. Current BMI is Body mass index is 35 kg/m.Marland Kitchen [name] [name] has struggled with obesity for years and has been unsuccessful in either losing weight or maintaining long term weight loss. [name] [name] attended our information session and states he is currently in the action stage of change and ready to dedicate time achieving and maintaining a healthier weight.  [name] [name] states his family eats meals together he thinks his family will eat healthier with  him his desired weight loss is 40 he has been heavy most of  his life he started gaining weight in his 20's his heaviest weight ever was 265 lbs. he has significant food cravings issues  he snacks frequently in the evenings he is frequently drinking liquids with calories he frequently eats larger portions than normal  he has binge eating behaviors   Fatigue [name] [name] feels his energy is lower than it should be. This has worsened with weight gain and has not worsened recently. [name] [name] admits to daytime somnolence and denies waking up still tired. [name] [name] is at risk for obstructive sleep apnea. Patent has a history of symptoms of daytime fatigue. [name] [name] generally gets 7 hours of sleep per night, and states they generally have restful sleep. Snoring is present. Apneic episodes are present. Epworth Sleepiness Score is 11  Sleep Apnea [name] [name] has a diagnosis of sleep apnea and is on CPAP (13 setting per [name] [name]). [name] [name] wears all night, every night.  Abnormal EKG [name] [name] has abnormal EKG with a history of AFIB, which resolved when he stopped all caffeine (per [name] [name]). He is not on medications currently and he denies chest pain. He is taking 81 mg of Aspirin daily.  Vitamin D deficiency [name] [name] has a diagnosis of vitamin D deficiency. He is currently taking OTC vit D 2,000 IU daily.  There are no recent labs. [name] [name] admits fatigue and denies nausea, vomiting or muscle weakness.  Hyperglycemia [name] [name] has a history of some elevated blood glucose readings in the past (elevated fasting glucose of 102 in 2016) without a diagnosis of diabetes. He admits to polyphagia but no family history of diabetes.  Hyperlipidemia [name] [name] has hyperlipidemia and is not currently on a statin. There are no recent labs. [name] [name] is attempting to improve his cholesterol levels with intensive lifestyle modification including a low saturated fat diet, exercise and weight loss. He denies any chest pain, claudication or myalgias.  Depression Screen [name] [name] Food and Mood (modified PHQ-9) score was  Depression screen PHQ 2/9 08/18/2017  Decreased Interest 1  Down, Depressed, Hopeless 0  PHQ - 2 Score 1  Altered sleeping 3  Tired, decreased energy 2  Change in appetite 0  Feeling bad or failure about yourself  1  Trouble concentrating 0  Moving slowly or fidgety/restless 0  Suicidal thoughts 0  PHQ-9 Score 7  Difficult doing work/chores Not difficult at all    ALLERGIES: No Known Allergies  MEDICATIONS: Current Outpatient Prescriptions on File Prior to Visit  Medication Sig Dispense Refill  . aspirin 81 MG chewable tablet Chew 81 mg by mouth every Monday, Wednesday, and Friday.     . Cholecalciferol (VITAMIN D) 2000 UNITS CAPS Take 1 capsule by mouth daily.    Marland Kitchen gabapentin (NEURONTIN) 300 MG capsule Take 3 capsules (900 mg total) by mouth 3 (three) times daily. 270 capsule 3  .  KRILL OIL PO Take 1 capsule by mouth daily.    . Multiple Vitamin (MULTIVITAMIN WITH MINERALS) TABS tablet Take 1 tablet by mouth daily.     No current facility-administered medications on file prior to visit.     PAST MEDICAL HISTORY: Past Medical History:  Diagnosis Date  . BPH (benign prostatic hypertrophy)   . Bradycardia 12/30/2014   HR routinely in mid 40s-50s  . Coronary artery disease (CAD) excluded April 2016    False-positive nuclear stress test suggesting inferior ischemia  . Elevated PSA   . Encounter for Medicare annual wellness exam 09/22/2013   Sees Dr Wilhemina Bonito of Derm Sees Dr Silvano Rusk of Gastroenterology Sees Dr Kathie Rhodes of [facility] Sees Dr Susa Day of Optometry  . Finger pain, left 04/12/2016   4th   . GERD (gastroesophageal reflux disease)    controlled w/ diet and behavioral changes  . Hypertriglyceridemia 03/16/2015  . Lymphadenitis   . Melanoma of back ([place])    Excised Dr Wilhemina Bonito  . New onset a-fib ([place]) 03/10/2015  . Otitis, externa, infective 08/10/2015  . Palpitations   . Post herpetic neuralgia   . Shingles 07/09/2013  . Sleep apnea 07/30/2011   cpap- 13   . Snoring disorder 07/30/2011  . White coat hypertension     PAST SURGICAL HISTORY: Past Surgical History:  Procedure Laterality Date  . LEFT HEART CATHETERIZATION WITH CORONARY ANGIOGRAM N/A 04/10/2015   Procedure: LEFT HEART CATHETERIZATION WITH  CORONARY ANGIOGRAM;  Surgeon: Leonie Man, MD;  Location: [facility] CATH LAB;  Service: Cardiovascular;  Angiographicallly NORMAL CORONARY ARTERIES  . MASS EXCISION N/A 05/16/2014   Procedure: EXCISION POSTERIOR NECK MASS, RIGHT CHEST WALL MASS AND RIGHT AXILLARY MASS AXILLARY NODE DISSECTION;  Surgeon: Adin Hector, MD;  Location: [facility] ORS;  Service: General;  Laterality: N/A;  . NM MYOVIEW LTD  03/19/2015   FALSE POSITIVE:  INTERMEDIATE RISK. Small sized, moderate intensity inferior ischemic perfusion defect  . TRANSTHORACIC ECHOCARDIOGRAM  03/20/2015   Normal LV size and function. EF 60-65%. G1 DD. Trivial AI. Borderline aortic root dilation (41 mm), Mild LA dilation.    SOCIAL HISTORY: Social History  Substance Use Topics  . Smoking status: Former Smoker    Types: Pipe, Cigarettes    Quit date: 02/11/1989  . Smokeless tobacco: Never Used  . Alcohol use 4.2 oz/week    7 Glasses of wine per week     Comment: 1 per day -- brandy or wine    FAMILY  HISTORY: Family History  Problem Relation Age of Onset  . Cancer Mother        MM, leukemia  . Obesity Mother   . Emphysema Father   . Obesity Father   . Alcohol abuse Father   . Cancer Sister        brain  . COPD Brother   . Heart disease Paternal Grandmother   . COPD Paternal Grandfather   . Diabetes Sister   . Obesity Sister   . Down syndrome Brother   . Alcohol abuse Other   . Colon cancer Neg Hx     ROS: Review of Systems  Constitutional: Positive for malaise/fatigue.  HENT: Positive for hearing loss and tinnitus.   Eyes: Positive for redness.       Wear Glasses or Contacts  Cardiovascular: Negative for chest pain and claudication.  Gastrointestinal: Negative for nausea and vomiting.  Musculoskeletal: Negative for myalgias.       Negative muscle weakness  Endo/Heme/Allergies: Bruises/bleeds easily (easy bleeding).       Polyphagia    PHYSICAL EXAM: Blood pressure 119/71, pulse 65, temperature (!) 97.5 F (36.4 C), temperature source Oral, height 5\' 9"  (1.753 m), weight 237 lb (107.5 kg), SpO2 99 %. Body mass index is 35 kg/m. Physical Exam  Constitutional: He is oriented to person, place, and time. He appears well-developed and well-nourished.  Cardiovascular:  Distant Heart Sounds Bilateral Lower Extremity Varicosities (Significant)   Pulmonary/Chest: Effort normal.  Musculoskeletal: Normal range of motion.  Neurological: He is oriented to person, place, and time.  Skin: Skin is warm and dry.  Psychiatric: He has a normal mood and affect. His behavior is normal.  Vitals reviewed.   RECENT LABS AND TESTS: BMET    Component Value Date/Time   NA 141 12/07/2016 1531   K 4.2 12/07/2016 1531   CL 105 12/07/2016 1531   CO2 29 12/07/2016 1531   GLUCOSE 85 12/07/2016 1531   BUN 20 12/07/2016 1531   CREATININE 1.01 12/07/2016 1531   CREATININE 0.92 04/04/2015 1113   CALCIUM 9.5 12/07/2016 1531   GFRNONAA 90 01/24/2009 1549   GFRAA 109 01/24/2009 1549    No results found for: HGBA1C No results found for: INSULIN CBC    Component Value Date/Time   WBC 8.4 12/07/2016 1531   RBC 4.80 12/07/2016 1531   HGB 15.4 12/07/2016 1531   HCT 44.8 12/07/2016 1531   PLT 185.0 12/07/2016 1531   MCV 93.4 12/07/2016 1531   MCH 28.8 04/04/2015 1113   MCHC 34.3 12/07/2016 1531   RDW 14.1 12/07/2016 1531   LYMPHSABS 1.9 09/13/2013 1350   MONOABS 0.7 09/13/2013 1350   EOSABS 0.2 09/13/2013 1350   BASOSABS 0.0 09/13/2013 1350   Iron/TIBC/Ferritin/ %Sat No results found for: IRON, TIBC, FERRITIN, IRONPCTSAT Lipid Panel     Component Value Date/Time   CHOL 148 04/12/2016 1004   TRIG 105.0 04/12/2016 1004   HDL 45.10 04/12/2016 1004   CHOLHDL 3 04/12/2016 1004   VLDL 21.0 04/12/2016 1004   LDLCALC 82 04/12/2016 1004   Hepatic Function Panel     Component Value Date/Time   PROT 6.8 12/07/2016 1531   ALBUMIN 4.1 12/07/2016 1531   AST 18 12/07/2016 1531   ALT 21 12/07/2016 1531   ALKPHOS 50 12/07/2016 1531   BILITOT 0.5 12/07/2016 1531   BILIDIR 0.1 02/11/2014 1016   IBILI 0.3 02/11/2014 1016      Component Value Date/Time   TSH 1.80 12/07/2016 1531   TSH 1.77 04/12/2016 1004   TSH 1.99 07/22/2015 0915    ECG  shows NSR with a rate of 88 BPM INDIRECT CALORIMETER done today shows a VO2 of 251 and a REE of 1747. His calculated basal metabolic rate is 2703 thus his basal metabolic rate is worse than expected.    ASSESSMENT AND PLAN: Other fatigue - Plan: EKG 12-Lead, CBC With Differential, Lipid Panel With LDL/HDL Ratio, T3, T4, free, TSH  Obstructive sleep apnea syndrome  Abnormal EKG  Vitamin D deficiency - Plan: VITAMIN D 25 Hydroxy (Vit-D Deficiency, Fractures)  Hyperglycemia - Plan: Comprehensive metabolic panel, Hemoglobin A1c, Insulin, random  Depression screening  Class 2 obesity with serious comorbidity and body mass index (BMI) of 35.0 to 35.9 in adult, unspecified obesity type  PLAN:  Fatigue Davelle was  informed that his fatigue may be related to obesity, depression or many other causes. Labs will be ordered, and in the meanwhile Jorden has agreed to work on  diet, exercise and weight loss to help with fatigue. Proper sleep hygiene was discussed including the need for 7-8 hours of quality sleep each night. A sleep study was not ordered based on symptoms and Epworth score.  Sleep Apnea We will plan to do indirect calorimetry and [name] [name] agrees to follow up with our clinic in 2 weeks.  Abnormal EKG [name] [name] agrees to work on diet, exercise and weight loss. He was educated today on the risks of heart disease with obesity. We will check labs and he will follow up with our clinic in 2 weeks.   Vitamin D Deficiency [name] [name] was informed that low vitamin D levels contributes to fatigue and are associated with obesity, breast, and colon cancer. He agrees to continue to take OTC Vit D, we will check labs and will follow up for routine testing of vitamin D, at least 2-3 times per year. He was informed of the risk of over-replacement of vitamin D and agrees to not increase his dose unless he discusses this with [name] first. [name] [name] agrees to follow up at the agreed upon time.  Hyperglycemia Fasting labs will be obtained and results with be discussed with [name] [name] in 2 weeks at his follow up visit. In the meanwhile Coe was started on a lower simple carbohydrate diet and will work on weight loss efforts.  Hyperlipidemia [name] [name] was informed of the American Heart Association Guidelines emphasizing intensive lifestyle modifications as the first line treatment for hyperlipidemia. We discussed many lifestyle modifications today in depth, and [name] [name] will continue to work on decreasing saturated fats such as fatty red meat, butter and many fried foods. He will also increase vegetables and lean protein in his diet and continue to work on exercise and weight loss efforts. We will check labs and follow.  Depression Screen [name] [name] had a  mildly positive depression screening. Depression is commonly associated with obesity and often results in emotional eating behaviors. We will monitor this closely and work on CBT to help improve the non-hunger eating patterns. Referral to Psychology may be required if no improvement is seen as he continues in our clinic.  Obesity [name] [name] is currently in the action stage of change and his goal is to continue with weight loss efforts He has agreed to follow the Category 3 plan [name] [name] has been instructed to work up to a goal of 150 minutes of combined cardio and strengthening exercise per week for weight loss and overall health benefits. We discussed the following Behavioral Modification Strategies today: meal planning & cooking strategies, increasing lean protein intake and decreasing simple carbohydrates   Alver has agreed to follow up with our clinic in 2 weeks. He was informed of the importance of frequent follow up visits to maximize his success with intensive lifestyle modifications for his multiple health conditions. He was informed we would discuss his lab results at his next visit unless there is a critical issue that needs to be addressed sooner. Cordney agreed to keep his next visit at the agreed upon time to discuss these results.  I, [name] [name], am acting as transcriptionist for Dennard Nip, MD  I have reviewed the above documentation for accuracy and completeness, and I agree with the above. -Dennard Nip, MD  OBESITY BEHAVIORAL INTERVENTION VISIT  Today's visit was # 1 out of 63.  Starting weight: 237 lbs Starting date: 08/18/17 Today's weight : 237 lbs Today's date: 08/18/2017 Total lbs lost to date: 0 (Patients must lose 7 lbs in the first 6 months to  continue with counseling)   ASK: We discussed the diagnosis of obesity with Lucianne Lei today and Aldean agreed to give [name] permission to discuss obesity behavioral modification therapy today.  ASSESS: Ray has the  diagnosis of obesity and his BMI today is 34.98 Mahmood is in the action stage of change   ADVISE: Braxley was educated on the multiple health risks of obesity as well as the benefit of weight loss to improve his health. He was advised of the need for long term treatment and the importance of lifestyle modifications.  AGREE: Multiple dietary modification options and treatment options were discussed and  Yiannis agreed to follow the Category 3 plan We discussed the following Behavioral Modification Strategies today: meal planning & cooking strategies, increasing lean protein intake and decreasing simple carbohydrates

## 2017-08-19 LAB — COMPREHENSIVE METABOLIC PANEL
ALBUMIN: 4.4 g/dL (ref 3.5–4.8)
ALK PHOS: 55 IU/L (ref 39–117)
ALT: 32 IU/L (ref 0–44)
AST: 26 IU/L (ref 0–40)
Albumin/Globulin Ratio: 1.9 (ref 1.2–2.2)
BUN / CREAT RATIO: 19 (ref 10–24)
BUN: 19 mg/dL (ref 8–27)
Bilirubin Total: 0.7 mg/dL (ref 0.0–1.2)
CHLORIDE: 104 mmol/L (ref 96–106)
CO2: 25 mmol/L (ref 20–29)
CREATININE: 1 mg/dL (ref 0.76–1.27)
Calcium: 9.1 mg/dL (ref 8.6–10.2)
GFR calc Af Amer: 85 mL/min/{1.73_m2} (ref >59)
GFR calc non Af Amer: 74 mL/min/{1.73_m2} (ref >59)
GLUCOSE: 90 mg/dL (ref 65–99)
Globulin, Total: 2.3 g/dL (ref 1.5–4.5)
Potassium: 4.9 mmol/L (ref 3.5–5.2)
Sodium: 142 mmol/L (ref 134–144)
TOTAL PROTEIN: 6.7 g/dL (ref 6.0–8.5)

## 2017-08-19 LAB — CBC WITH DIFFERENTIAL
BASOS: 0 %
Basophils Absolute: 0 10*3/uL (ref 0.0–0.2)
EOS (ABSOLUTE): 0.1 10*3/uL (ref 0.0–0.4)
EOS: 1 %
HEMATOCRIT: 46.8 % (ref 37.5–51.0)
HEMOGLOBIN: 15.5 g/dL (ref 13.0–17.7)
IMMATURE GRANS (ABS): 0 10*3/uL (ref 0.0–0.1)
IMMATURE GRANULOCYTES: 0 %
Lymphocytes Absolute: 1.7 10*3/uL (ref 0.7–3.1)
Lymphs: 25 %
MCH: 31.8 pg (ref 26.6–33.0)
MCHC: 33.1 g/dL (ref 31.5–35.7)
MCV: 96 fL (ref 79–97)
MONOCYTES: 9 %
Monocytes Absolute: 0.6 10*3/uL (ref 0.1–0.9)
Neutrophils Absolute: 4.3 10*3/uL (ref 1.4–7.0)
Neutrophils: 65 %
RBC: 4.88 x10E6/uL (ref 4.14–5.80)
RDW: 14.3 % (ref 12.3–15.4)
WBC: 6.7 10*3/uL (ref 3.4–10.8)

## 2017-08-19 LAB — VITAMIN D 25 HYDROXY (VIT D DEFICIENCY, FRACTURES): Vit D, 25-Hydroxy: 51.1 ng/mL (ref 30.0–100.0)

## 2017-08-19 LAB — HEMOGLOBIN A1C
ESTIMATED AVERAGE GLUCOSE: 120 mg/dL
Hgb A1c MFr Bld: 5.8 % — ABNORMAL HIGH (ref 4.8–5.6)

## 2017-08-19 LAB — LIPID PANEL WITH LDL/HDL RATIO
CHOLESTEROL TOTAL: 157 mg/dL (ref 100–199)
HDL: 48 mg/dL (ref >39)
LDL Calculated: 92 mg/dL (ref 0–99)
LDl/HDL Ratio: 1.9 ratio (ref 0.0–3.6)
TRIGLYCERIDES: 86 mg/dL (ref 0–149)
VLDL CHOLESTEROL CAL: 17 mg/dL (ref 5–40)

## 2017-08-19 LAB — T3: T3, Total: 91 ng/dL (ref 71–180)

## 2017-08-19 LAB — INSULIN, RANDOM: INSULIN: 17.6 u[IU]/mL (ref 2.6–24.9)

## 2017-08-19 LAB — TSH: TSH: 1.68 u[IU]/mL (ref 0.450–4.500)

## 2017-08-19 LAB — T4, FREE: FREE T4: 1.01 ng/dL (ref 0.82–1.77)

## 2017-09-01 ENCOUNTER — Ambulatory Visit (INDEPENDENT_AMBULATORY_CARE_PROVIDER_SITE_OTHER): Payer: Medicare Other | Admitting: Family Medicine

## 2017-09-01 VITALS — BP 94/60 | HR 76 | Temp 98.1°F | Ht 69.0 in | Wt 234.0 lb

## 2017-09-01 DIAGNOSIS — E669 Obesity, unspecified: Secondary | ICD-10-CM | POA: Diagnosis not present

## 2017-09-01 DIAGNOSIS — Z6834 Body mass index (BMI) 34.0-34.9, adult: Secondary | ICD-10-CM | POA: Diagnosis not present

## 2017-09-01 DIAGNOSIS — E559 Vitamin D deficiency, unspecified: Secondary | ICD-10-CM | POA: Diagnosis not present

## 2017-09-01 DIAGNOSIS — R7303 Prediabetes: Secondary | ICD-10-CM | POA: Diagnosis not present

## 2017-09-01 NOTE — Progress Notes (Signed)
Office: [phone]  /  Fax: [phone]   HPI:   Chief Complaint: OBESITY [name] [name] is here to discuss his progress with his obesity treatment plan. He is on the  follow the Category 3 plan and is following his eating plan approximately 99 % of the time. He states he is exercising 60 minutes 2 times per week. [name] [name] did well with weight loss on the category 3 plan. Hunger was controlled and he got good support from his wife. They are traveling for the next 4 weeks and have questions about how to eat while traveling. His weight is 234 lb (106.1 kg) today and has had a weight loss of 3 pounds over a period of 2 weeks since his last visit. He has lost 3 lbs since starting treatment with [name].  Vitamin D deficiency [name] [name] has a diagnosis of vitamin D deficiency. He is currently taking OTC vit D 5,000 IU daily and level is now at goal. He denies nausea, vomiting or muscle weakness.  Pre-Diabetes [name] [name] has a diagnosis of pre-diabetes based on his elevated Hgb A1c @ 5.8 and was informed this puts him at greater risk of developing diabetes. He is not taking metformin currently and continues to work on diet and exercise to decrease risk of diabetes. He has polyphagia but this has improved on the category 3 diet plan. He denies nausea or hypoglycemia.  ALLERGIES: No Known Allergies  MEDICATIONS: Current Outpatient Prescriptions on File Prior to Visit  Medication Sig Dispense Refill  . aspirin 81 MG chewable tablet Chew 81 mg by mouth every Monday, Wednesday, and Friday.     . Cholecalciferol (VITAMIN D) 2000 UNITS CAPS Take 1 capsule by mouth daily.    . diazepam (VALIUM) 5 MG tablet Take 5 mg by mouth every 6 (six) hours as needed for anxiety.    . gabapentin (NEURONTIN) 300 MG capsule Take 3 capsules (900 mg total) by mouth 3 (three) times daily. 270 capsule 3  . KRILL OIL PO Take 1 capsule by mouth daily.    . Multiple Vitamin (MULTIVITAMIN WITH MINERALS) TABS tablet Take 1 tablet by mouth daily.      No current facility-administered medications on file prior to visit.     PAST MEDICAL HISTORY: Past Medical History:  Diagnosis Date  . BPH (benign prostatic hypertrophy)   . Bradycardia 12/30/2014   HR routinely in mid 40s-50s  . Coronary artery disease (CAD) excluded April 2016   False-positive nuclear stress test suggesting inferior ischemia  . Elevated PSA   . Encounter for Medicare annual wellness exam 09/22/2013   Sees Dr Wilhemina Bonito of Derm Sees Dr Silvano Rusk of Gastroenterology Sees Dr Kathie Rhodes of [facility] Sees Dr Susa Day of Optometry  . Finger pain, left 04/12/2016   4th   . GERD (gastroesophageal reflux disease)    controlled w/ diet and behavioral changes  . Hypertriglyceridemia 03/16/2015  . Lymphadenitis   . Melanoma of back ([place])    Excised Dr Wilhemina Bonito  . New onset a-fib ([place]) 03/10/2015  . Otitis, externa, infective 08/10/2015  . Palpitations   . Post herpetic neuralgia   . Shingles 07/09/2013  . Sleep apnea 07/30/2011   cpap- 13   . Snoring disorder 07/30/2011  . White coat hypertension     PAST SURGICAL HISTORY: Past Surgical History:  Procedure Laterality Date  . LEFT HEART CATHETERIZATION WITH CORONARY ANGIOGRAM N/A 04/10/2015   Procedure: LEFT HEART CATHETERIZATION WITH  CORONARY ANGIOGRAM;  Surgeon: Leonie Green  Ellyn Hack, MD;  Location: [facility] CATH LAB;  Service: Cardiovascular;  Angiographicallly NORMAL CORONARY ARTERIES  . MASS EXCISION N/A 05/16/2014   Procedure: EXCISION POSTERIOR NECK MASS, RIGHT CHEST WALL MASS AND RIGHT AXILLARY MASS AXILLARY NODE DISSECTION;  Surgeon: Adin Hector, MD;  Location: [facility] ORS;  Service: General;  Laterality: N/A;  . NM MYOVIEW LTD  03/19/2015   FALSE POSITIVE:  INTERMEDIATE RISK. Small sized, moderate intensity inferior ischemic perfusion defect  . TRANSTHORACIC ECHOCARDIOGRAM  03/20/2015   Normal LV size and function. EF 60-65%. G1 DD. Trivial AI. Borderline aortic root dilation (41 mm), Mild LA dilation.     SOCIAL HISTORY: Social History  Substance Use Topics  . Smoking status: Former Smoker    Types: Pipe, Cigarettes    Quit date: 02/11/1989  . Smokeless tobacco: Never Used  . Alcohol use 4.2 oz/week    7 Glasses of wine per week     Comment: 1 per day -- brandy or wine    FAMILY HISTORY: Family History  Problem Relation Age of Onset  . Cancer Mother        MM, leukemia  . Obesity Mother   . Emphysema Father   . Obesity Father   . Alcohol abuse Father   . Cancer Sister        brain  . COPD Brother   . Heart disease Paternal Grandmother   . COPD Paternal Grandfather   . Diabetes Sister   . Obesity Sister   . Down syndrome Brother   . Alcohol abuse Other   . Colon cancer Neg Hx     ROS: Review of Systems  Constitutional: Positive for weight loss.  Gastrointestinal: Negative for nausea and vomiting.  Musculoskeletal:       Negative muscle weakness  Endo/Heme/Allergies:       Polyphagia Negative hypoglycemia    PHYSICAL EXAM: Blood pressure 94/60, pulse 76, temperature 98.1 F (36.7 C), temperature source Oral, height 5\' 9"  (1.753 m), weight 234 lb (106.1 kg), SpO2 98 %. Body mass index is 34.56 kg/m. Physical Exam  Constitutional: He is oriented to person, place, and time. He appears well-developed and well-nourished.  Cardiovascular: Normal rate.   Pulmonary/Chest: Effort normal.  Musculoskeletal: Normal range of motion.  Neurological: He is oriented to person, place, and time.  Skin: Skin is warm and dry.  Psychiatric: He has a normal mood and affect. His behavior is normal.  Vitals reviewed.   RECENT LABS AND TESTS: BMET    Component Value Date/Time   NA 142 08/18/2017 1059   K 4.9 08/18/2017 1059   CL 104 08/18/2017 1059   CO2 25 08/18/2017 1059   GLUCOSE 90 08/18/2017 1059   GLUCOSE 85 12/07/2016 1531   BUN 19 08/18/2017 1059   CREATININE 1.00 08/18/2017 1059   CREATININE 0.92 04/04/2015 1113   CALCIUM 9.1 08/18/2017 1059   GFRNONAA 74  08/18/2017 1059   GFRAA 85 08/18/2017 1059   Lab Results  Component Value Date   HGBA1C 5.8 (H) 08/18/2017   Lab Results  Component Value Date   INSULIN 17.6 08/18/2017   CBC    Component Value Date/Time   WBC 6.7 08/18/2017 1059   WBC 8.4 12/07/2016 1531   RBC 4.88 08/18/2017 1059   RBC 4.80 12/07/2016 1531   HGB 15.5 08/18/2017 1059   HCT 46.8 08/18/2017 1059   PLT 185.0 12/07/2016 1531   MCV 96 08/18/2017 1059   MCH 31.8 08/18/2017 1059   MCH 28.8 04/04/2015 1113  MCHC 33.1 08/18/2017 1059   MCHC 34.3 12/07/2016 1531   RDW 14.3 08/18/2017 1059   LYMPHSABS 1.7 08/18/2017 1059   MONOABS 0.7 09/13/2013 1350   EOSABS 0.1 08/18/2017 1059   BASOSABS 0.0 08/18/2017 1059   Iron/TIBC/Ferritin/ %Sat No results found for: IRON, TIBC, FERRITIN, IRONPCTSAT Lipid Panel     Component Value Date/Time   CHOL 157 08/18/2017 1059   TRIG 86 08/18/2017 1059   HDL 48 08/18/2017 1059   CHOLHDL 3 04/12/2016 1004   VLDL 21.0 04/12/2016 1004   LDLCALC 92 08/18/2017 1059   Hepatic Function Panel     Component Value Date/Time   PROT 6.7 08/18/2017 1059   ALBUMIN 4.4 08/18/2017 1059   AST 26 08/18/2017 1059   ALT 32 08/18/2017 1059   ALKPHOS 55 08/18/2017 1059   BILITOT 0.7 08/18/2017 1059   BILIDIR 0.1 02/11/2014 1016   IBILI 0.3 02/11/2014 1016      Component Value Date/Time   TSH 1.680 08/18/2017 1059   TSH 1.80 12/07/2016 1531   TSH 1.77 04/12/2016 1004    ASSESSMENT AND PLAN: Vitamin D deficiency  Prediabetes  Class 1 obesity with serious comorbidity and body mass index (BMI) of 34.0 to 34.9 in adult, unspecified obesity type  PLAN:  Vitamin D Deficiency [name] [name] was informed that low vitamin D levels contributes to fatigue and are associated with obesity, breast, and colon cancer. He agrees to continue to take prescription Vit D @5 ,000 IU daily and will follow up for routine testing of vitamin D, at least 2-3 times per year. He was informed of the risk of  over-replacement of vitamin D and agrees to not increase his dose unless he discusses this with [name] first.  Pre-Diabetes [name] [name] will continue to work on weight loss, exercise, and decreasing simple carbohydrates in his diet to help decrease the risk of diabetes. We dicussed metformin including benefits and risks. He was informed that eating too many simple carbohydrates or too many calories at one sitting increases the likelihood of GI side effects. [name] [name] declined metformin for now and a prescription was not written today. We will re-check labs in 3 months and [name] [name] agreed to follow up with [name] as directed to monitor his progress.   We spent > than 50% of the 45 minute visit on the counseling as documented in the note.  Obesity [name] [name] is currently in the action stage of change. As such, his goal is to continue with weight loss efforts He has agreed to follow the Category 3 plan [name] [name] has been instructed to work up to a goal of 150 minutes of combined cardio and strengthening exercise per week for weight loss and overall health benefits. We discussed the following Behavioral Modification Strategies today: travel eating strategies, celebration eating strategies, increasing lean protein intake and decreasing simple carbohydrates   [name] [name] has agreed to follow up with our clinic in 4 weeks. He was informed of the importance of frequent follow up visits to maximize his success with intensive lifestyle modifications for his multiple health conditions.  [name] [name], [name] [name], am acting as transcriptionist for Dennard Nip, MD  [name] [name] have reviewed the above documentation for accuracy and completeness, and [name] [name] agree with the above. -Dennard Nip, MD   Office: [phone]  /  Fax: [phone]  OBESITY BEHAVIORAL INTERVENTION VISIT  Today's visit was # 2 out of 77.  Starting weight: 237 lbs Starting date: 08/18/17 Today's weight : 234 lbs Today's date: 09/01/2017 Total lbs lost to date: 3 (Patients  must lose 7  lbs in the first 6 months to continue with counseling)   ASK: We discussed the diagnosis of obesity with Lucianne Lei today and Yvette agreed to give [name] permission to discuss obesity behavioral modification therapy today.  ASSESS: Adison has the diagnosis of obesity and his BMI today is 34.54 Braelynn is in the action stage of change   ADVISE: Atlas was educated on the multiple health risks of obesity as well as the benefit of weight loss to improve his health. He was advised of the need for long term treatment and the importance of lifestyle modifications.  AGREE: Multiple dietary modification options and treatment options were discussed and  Jakaleb agreed to follow the Category 3 plan We discussed the following Behavioral Modification Strategies today: travel eating strategies, celebration eating strategies, increasing lean protein intake and decreasing simple carbohydrates

## 2017-09-28 ENCOUNTER — Ambulatory Visit (INDEPENDENT_AMBULATORY_CARE_PROVIDER_SITE_OTHER): Payer: Medicare Other | Admitting: Family Medicine

## 2017-09-28 ENCOUNTER — Telehealth: Payer: Self-pay | Admitting: Family Medicine

## 2017-09-28 VITALS — BP 103/70 | HR 75 | Temp 97.6°F | Ht 69.0 in | Wt 233.0 lb

## 2017-09-28 DIAGNOSIS — E669 Obesity, unspecified: Secondary | ICD-10-CM

## 2017-09-28 DIAGNOSIS — Z6834 Body mass index (BMI) 34.0-34.9, adult: Secondary | ICD-10-CM

## 2017-09-28 DIAGNOSIS — R7303 Prediabetes: Secondary | ICD-10-CM | POA: Diagnosis not present

## 2017-09-28 NOTE — Telephone Encounter (Signed)
The patient is needing to get his injection but all the pharmacies are holding injections for the patients that need the 2nd dose. The patient would like to know  If he can get his injection here. I advised the patients wife that over the age of 57 the insurance requires that the injection is give at the pharmacy.   Please advise

## 2017-09-28 NOTE — Progress Notes (Signed)
Office: [phone]  /  Fax: [phone]   HPI:   Chief Complaint: OBESITY [name] [name] is here to discuss his progress with his obesity treatment plan. He is on the Category 3 plan and is following his eating plan approximately 50 % of the time. He states he is exercising low impact aerobics for 60 minutes 2 times per week. [name] [name] has done well with weight loss, even while traveling and on vacation and eating out. He is doing yard work in addition to going to [employer]. His weight is 233 lb (105.7 kg) today and has had a weight loss of 1 pound over a period of 4 weeks since his last visit. He has lost 4 lbs since starting treatment with [name].  Pre-Diabetes [name] [name] has a diagnosis of pre-diabetes based on his elevated Hgb A1c and was informed this puts him at greater risk of developing diabetes. He is not taking metformin currently and continues to work on diet and exercise to decrease risk of diabetes. He is attempting to control his pre-diabetes with diet, exercise and weight loss and is doing well with decreasing simple carbohydrates. He has decreased polyphagia and denies nausea or hypoglycemia.    ALLERGIES: No Known Allergies  MEDICATIONS: Current Outpatient Prescriptions on File Prior to Visit  Medication Sig Dispense Refill  . aspirin 81 MG chewable tablet Chew 81 mg by mouth every Monday, Wednesday, and Friday.     . diazepam (VALIUM) 5 MG tablet Take 5 mg by mouth every 6 (six) hours as needed for anxiety.    . gabapentin (NEURONTIN) 300 MG capsule Take 3 capsules (900 mg total) by mouth 3 (three) times daily. 270 capsule 3  . KRILL OIL PO Take 1 capsule by mouth daily.    . Multiple Vitamin (MULTIVITAMIN WITH MINERALS) TABS tablet Take 1 tablet by mouth daily.     No current facility-administered medications on file prior to visit.     PAST MEDICAL HISTORY: Past Medical History:  Diagnosis Date  . BPH (benign prostatic hypertrophy)   . Bradycardia 12/30/2014   HR routinely in mid  40s-50s  . Coronary artery disease (CAD) excluded April 2016   False-positive nuclear stress test suggesting inferior ischemia  . Elevated PSA   . Encounter for Medicare annual wellness exam 09/22/2013   Sees Dr Wilhemina Bonito of Derm Sees Dr Silvano Rusk of Gastroenterology Sees Dr Kathie Rhodes of [facility] Sees Dr Susa Day of Optometry  . Finger pain, left 04/12/2016   4th   . GERD (gastroesophageal reflux disease)    controlled w/ diet and behavioral changes  . Hypertriglyceridemia 03/16/2015  . Lymphadenitis   . Melanoma of back ([place])    Excised Dr Wilhemina Bonito  . New onset a-fib ([place]) 03/10/2015  . Otitis, externa, infective 08/10/2015  . Palpitations   . Post herpetic neuralgia   . Shingles 07/09/2013  . Sleep apnea 07/30/2011   cpap- 13   . Snoring disorder 07/30/2011  . White coat hypertension     PAST SURGICAL HISTORY: Past Surgical History:  Procedure Laterality Date  . LEFT HEART CATHETERIZATION WITH CORONARY ANGIOGRAM N/A 04/10/2015   Procedure: LEFT HEART CATHETERIZATION WITH  CORONARY ANGIOGRAM;  Surgeon: Leonie Man, MD;  Location: [facility] CATH LAB;  Service: Cardiovascular;  Angiographicallly NORMAL CORONARY ARTERIES  . MASS EXCISION N/A 05/16/2014   Procedure: EXCISION POSTERIOR NECK MASS, RIGHT CHEST WALL MASS AND RIGHT AXILLARY MASS AXILLARY NODE DISSECTION;  Surgeon: Adin Hector, MD;  Location: [facility] ORS;  Service: General;  Laterality: N/A;  . NM MYOVIEW LTD  03/19/2015   FALSE POSITIVE:  INTERMEDIATE RISK. Small sized, moderate intensity inferior ischemic perfusion defect  . TRANSTHORACIC ECHOCARDIOGRAM  03/20/2015   Normal LV size and function. EF 60-65%. G1 DD. Trivial AI. Borderline aortic root dilation (41 mm), Mild LA dilation.    SOCIAL HISTORY: Social History  Substance Use Topics  . Smoking status: Former Smoker    Types: Pipe, Cigarettes    Quit date: 02/11/1989  . Smokeless tobacco: Never Used  . Alcohol use 4.2 oz/week    7 Glasses of wine per  week     Comment: 1 per day -- brandy or wine    FAMILY HISTORY: Family History  Problem Relation Age of Onset  . Cancer Mother        MM, leukemia  . Obesity Mother   . Emphysema Father   . Obesity Father   . Alcohol abuse Father   . Cancer Sister        brain  . COPD Brother   . Heart disease Paternal Grandmother   . COPD Paternal Grandfather   . Diabetes Sister   . Obesity Sister   . Down syndrome Brother   . Alcohol abuse Other   . Colon cancer Neg Hx     ROS: Review of Systems  Constitutional: Positive for weight loss.  Gastrointestinal: Negative for nausea.  Endo/Heme/Allergies:       Polyphagia Negative hypoglycemia    PHYSICAL EXAM: Blood pressure 103/70, pulse 75, temperature 97.6 F (36.4 C), temperature source Oral, height 5\' 9"  (1.753 m), weight 233 lb (105.7 kg), SpO2 98 %. Body mass index is 34.41 kg/m. Physical Exam  Constitutional: He is oriented to person, place, and time. He appears well-developed and well-nourished.  Cardiovascular: Normal rate.   Pulmonary/Chest: Effort normal.  Musculoskeletal: Normal range of motion.  Neurological: He is oriented to person, place, and time.  Skin: Skin is warm and dry.  Psychiatric: He has a normal mood and affect. His behavior is normal.  Vitals reviewed.   RECENT LABS AND TESTS: BMET    Component Value Date/Time   NA 142 08/18/2017 1059   K 4.9 08/18/2017 1059   CL 104 08/18/2017 1059   CO2 25 08/18/2017 1059   GLUCOSE 90 08/18/2017 1059   GLUCOSE 85 12/07/2016 1531   BUN 19 08/18/2017 1059   CREATININE 1.00 08/18/2017 1059   CREATININE 0.92 04/04/2015 1113   CALCIUM 9.1 08/18/2017 1059   GFRNONAA 74 08/18/2017 1059   GFRAA 85 08/18/2017 1059   Lab Results  Component Value Date   HGBA1C 5.8 (H) 08/18/2017   Lab Results  Component Value Date   INSULIN 17.6 08/18/2017   CBC    Component Value Date/Time   WBC 6.7 08/18/2017 1059   WBC 8.4 12/07/2016 1531   RBC 4.88 08/18/2017 1059    RBC 4.80 12/07/2016 1531   HGB 15.5 08/18/2017 1059   HCT 46.8 08/18/2017 1059   PLT 185.0 12/07/2016 1531   MCV 96 08/18/2017 1059   MCH 31.8 08/18/2017 1059   MCH 28.8 04/04/2015 1113   MCHC 33.1 08/18/2017 1059   MCHC 34.3 12/07/2016 1531   RDW 14.3 08/18/2017 1059   LYMPHSABS 1.7 08/18/2017 1059   MONOABS 0.7 09/13/2013 1350   EOSABS 0.1 08/18/2017 1059   BASOSABS 0.0 08/18/2017 1059   Iron/TIBC/Ferritin/ %Sat No results found for: IRON, TIBC, FERRITIN, IRONPCTSAT Lipid Panel     Component Value Date/Time  CHOL 157 08/18/2017 1059   TRIG 86 08/18/2017 1059   HDL 48 08/18/2017 1059   CHOLHDL 3 04/12/2016 1004   VLDL 21.0 04/12/2016 1004   LDLCALC 92 08/18/2017 1059   Hepatic Function Panel     Component Value Date/Time   PROT 6.7 08/18/2017 1059   ALBUMIN 4.4 08/18/2017 1059   AST 26 08/18/2017 1059   ALT 32 08/18/2017 1059   ALKPHOS 55 08/18/2017 1059   BILITOT 0.7 08/18/2017 1059   BILIDIR 0.1 02/11/2014 1016   IBILI 0.3 02/11/2014 1016      Component Value Date/Time   TSH 1.680 08/18/2017 1059   TSH 1.80 12/07/2016 1531   TSH 1.77 04/12/2016 1004    ASSESSMENT AND PLAN: Prediabetes  Class 1 obesity with serious comorbidity and body mass index (BMI) of 34.0 to 34.9 in adult, unspecified obesity type  PLAN:  Pre-Diabetes [name] [name] will continue to work on weight loss, exercise, and decreasing simple carbohydrates in his diet to help decrease the risk of diabetes. He was informed that eating too many simple carbohydrates or too many calories at one sitting increases the likelihood of GI side effects. We will recheck labs in 1 month and [name] [name] agreed to follow up with [name] as directed to monitor his progress.  We spent > than 50% of the 15 minute visit on the counseling as documented in the note.   Obesity [name] [name] is currently in the action stage of change. As such, his goal is to continue with weight loss efforts He has agreed to follow the Category 3  plan [name] [name] has been instructed to work up to a goal of 150 minutes of combined cardio and strengthening exercise per week for weight loss and overall health benefits. We discussed the following Behavioral Modification Strategies today: increasing lean protein intake and decreasing simple carbohydrates   [name] [name] has agreed to follow up with our clinic in 3 weeks. He was informed of the importance of frequent follow up visits to maximize his success with intensive lifestyle modifications for his multiple health conditions.  I, [name] [name], am acting as transcriptionist for [name] Nip, MD  I have reviewed the above documentation for accuracy and completeness, and I agree with the above. -[name] Nip, MD    OBESITY BEHAVIORAL INTERVENTION VISIT  Today's visit was # 3 out of 22.  Starting weight: 237 lbs Starting date: 08/18/17 Today's weight : 233 lbs Today's date: 09/28/2017 Total lbs lost to date: 4 (Patients must lose 7 lbs in the first 6 months to continue with counseling)   ASK: We discussed the diagnosis of obesity with [name] [name] today and [name] [name] agreed to give [name] permission to discuss obesity behavioral modification therapy today.  ASSESS: [name] [name] has the diagnosis of obesity and his BMI today is 34.39 [name] [name] is in the action stage of change   ADVISE: [name] [name] was educated on the multiple health risks of obesity as well as the benefit of weight loss to improve his health. He was advised of the need for long term treatment and the importance of lifestyle modifications.  AGREE: Multiple dietary modification options and treatment options were discussed and  [name] [name] agreed to follow the Category 3 plan We discussed the following Behavioral Modification Strategies today: increasing lean protein intake and decreasing simple carbohydrates

## 2017-09-30 NOTE — Telephone Encounter (Signed)
We currently do not have any Shingrix if this is the injection he is referring to.

## 2017-10-10 ENCOUNTER — Ambulatory Visit (INDEPENDENT_AMBULATORY_CARE_PROVIDER_SITE_OTHER): Payer: Medicare Other | Admitting: Family Medicine

## 2017-10-10 VITALS — BP 106/81 | HR 72 | Temp 97.7°F | Ht 69.0 in | Wt 227.0 lb

## 2017-10-10 DIAGNOSIS — Z6833 Body mass index (BMI) 33.0-33.9, adult: Secondary | ICD-10-CM | POA: Diagnosis not present

## 2017-10-10 DIAGNOSIS — E781 Pure hyperglyceridemia: Secondary | ICD-10-CM | POA: Diagnosis not present

## 2017-10-10 DIAGNOSIS — E669 Obesity, unspecified: Secondary | ICD-10-CM

## 2017-10-10 NOTE — Progress Notes (Signed)
Office: [phone]  /  Fax: [phone]   HPI:   Chief Complaint: OBESITY [name] [name] is here to discuss his progress with his obesity treatment plan. He is on the  follow the Category 3 plan and is following his eating plan approximately 95 % of the time. He states he is exercising at the [place] for 60 minutes 2 times per week. [name] [name] continues to do well with the category 3 plan and exercise. He is exercising 2 times a week with low impact aerobics and some strengthening. His weight is 227 lb (103 kg) today and has had a weight loss of 6 pounds over a period of 2 weeks since his last visit. He has lost 10 lbs since starting treatment with [name].  Hypertriglyceridemia [name] [name] has hypertriglyceridemia and is on krill oil and is doing very well with his diet and weight loss efforts. He has been trying to improve his cholesterol levels with intensive lifestyle modification including a low saturated fat diet, exercise and weight loss. He denies any chest pain, claudication or myalgias.   ALLERGIES: No Known Allergies  MEDICATIONS: Current Outpatient Prescriptions on File Prior to Visit  Medication Sig Dispense Refill  . aspirin 81 MG chewable tablet Chew 81 mg by mouth every Monday, Wednesday, and Friday.     . Cholecalciferol (VITAMIN D3) 5000 units CAPS Take 1 capsule by mouth daily.    Marland Kitchen gabapentin (NEURONTIN) 300 MG capsule Take 3 capsules (900 mg total) by mouth 3 (three) times daily. 270 capsule 3  . KRILL OIL PO Take 1 capsule by mouth daily.    . Multiple Vitamin (MULTIVITAMIN WITH MINERALS) TABS tablet Take 1 tablet by mouth daily.     No current facility-administered medications on file prior to visit.     PAST MEDICAL HISTORY: Past Medical History:  Diagnosis Date  . BPH (benign prostatic hypertrophy)   . Bradycardia 12/30/2014   HR routinely in mid 40s-50s  . Coronary artery disease (CAD) excluded April 2016   False-positive nuclear stress test suggesting inferior ischemia  .  Elevated PSA   . Encounter for Medicare annual wellness exam 09/22/2013   Sees Dr Wilhemina Bonito of Derm Sees Dr Silvano Rusk of Gastroenterology Sees Dr Kathie Rhodes of [facility] Sees Dr Susa Day of Optometry  . Finger pain, left 04/12/2016   4th   . GERD (gastroesophageal reflux disease)    controlled w/ diet and behavioral changes  . Hypertriglyceridemia 03/16/2015  . Lymphadenitis   . Melanoma of back ([place])    Excised Dr Wilhemina Bonito  . New onset a-fib ([place]) 03/10/2015  . Otitis, externa, infective 08/10/2015  . Palpitations   . Post herpetic neuralgia   . Shingles 07/09/2013  . Sleep apnea 07/30/2011   cpap- 13   . Snoring disorder 07/30/2011  . White coat hypertension     PAST SURGICAL HISTORY: Past Surgical History:  Procedure Laterality Date  . LEFT HEART CATHETERIZATION WITH CORONARY ANGIOGRAM N/A 04/10/2015   Procedure: LEFT HEART CATHETERIZATION WITH  CORONARY ANGIOGRAM;  Surgeon: Leonie Man, MD;  Location: [facility] CATH LAB;  Service: Cardiovascular;  Angiographicallly NORMAL CORONARY ARTERIES  . MASS EXCISION N/A 05/16/2014   Procedure: EXCISION POSTERIOR NECK MASS, RIGHT CHEST WALL MASS AND RIGHT AXILLARY MASS AXILLARY NODE DISSECTION;  Surgeon: Adin Hector, MD;  Location: [facility] ORS;  Service: General;  Laterality: N/A;  . NM MYOVIEW LTD  03/19/2015   FALSE POSITIVE:  INTERMEDIATE RISK. Small sized, moderate intensity inferior ischemic perfusion  defect  . TRANSTHORACIC ECHOCARDIOGRAM  03/20/2015   Normal LV size and function. EF 60-65%. G1 DD. Trivial AI. Borderline aortic root dilation (41 mm), Mild LA dilation.    SOCIAL HISTORY: Social History  Substance Use Topics  . Smoking status: Former Smoker    Types: Pipe, Cigarettes    Quit date: 02/11/1989  . Smokeless tobacco: Never Used  . Alcohol use 4.2 oz/week    7 Glasses of wine per week     Comment: 1 per day -- brandy or wine    FAMILY HISTORY: Family History  Problem Relation Age of Onset  . Cancer  Mother        MM, leukemia  . Obesity Mother   . Emphysema Father   . Obesity Father   . Alcohol abuse Father   . Cancer Sister        brain  . COPD Brother   . Heart disease Paternal Grandmother   . COPD Paternal Grandfather   . Diabetes Sister   . Obesity Sister   . Down syndrome Brother   . Alcohol abuse Other   . Colon cancer Neg Hx     ROS: Review of Systems  Constitutional: Positive for weight loss.  Cardiovascular: Negative for chest pain and claudication.  Musculoskeletal: Negative for myalgias.    PHYSICAL EXAM: Blood pressure 106/81, pulse 72, temperature 97.7 F (36.5 C), temperature source Oral, height 5\' 9"  (1.753 m), weight 227 lb (103 kg), SpO2 97 %. Body mass index is 33.52 kg/m. Physical Exam  Constitutional: He is oriented to person, place, and time. He appears well-developed and well-nourished.  Cardiovascular: Normal rate.   Pulmonary/Chest: Effort normal.  Musculoskeletal: Normal range of motion.  Neurological: He is oriented to person, place, and time.  Skin: Skin is warm and dry.  Psychiatric: He has a normal mood and affect. His behavior is normal.  Vitals reviewed.   RECENT LABS AND TESTS: BMET    Component Value Date/Time   NA 142 08/18/2017 1059   K 4.9 08/18/2017 1059   CL 104 08/18/2017 1059   CO2 25 08/18/2017 1059   GLUCOSE 90 08/18/2017 1059   GLUCOSE 85 12/07/2016 1531   BUN 19 08/18/2017 1059   CREATININE 1.00 08/18/2017 1059   CREATININE 0.92 04/04/2015 1113   CALCIUM 9.1 08/18/2017 1059   GFRNONAA 74 08/18/2017 1059   GFRAA 85 08/18/2017 1059   Lab Results  Component Value Date   HGBA1C 5.8 (H) 08/18/2017   Lab Results  Component Value Date   INSULIN 17.6 08/18/2017   CBC    Component Value Date/Time   WBC 6.7 08/18/2017 1059   WBC 8.4 12/07/2016 1531   RBC 4.88 08/18/2017 1059   RBC 4.80 12/07/2016 1531   HGB 15.5 08/18/2017 1059   HCT 46.8 08/18/2017 1059   PLT 185.0 12/07/2016 1531   MCV 96 08/18/2017  1059   MCH 31.8 08/18/2017 1059   MCH 28.8 04/04/2015 1113   MCHC 33.1 08/18/2017 1059   MCHC 34.3 12/07/2016 1531   RDW 14.3 08/18/2017 1059   LYMPHSABS 1.7 08/18/2017 1059   MONOABS 0.7 09/13/2013 1350   EOSABS 0.1 08/18/2017 1059   BASOSABS 0.0 08/18/2017 1059   Iron/TIBC/Ferritin/ %Sat No results found for: IRON, TIBC, FERRITIN, IRONPCTSAT Lipid Panel     Component Value Date/Time   CHOL 157 08/18/2017 1059   TRIG 86 08/18/2017 1059   HDL 48 08/18/2017 1059   CHOLHDL 3 04/12/2016 1004   VLDL 21.0 04/12/2016  [place] 92 08/18/2017 1059   Hepatic Function Panel     Component Value Date/Time   PROT 6.7 08/18/2017 1059   ALBUMIN 4.4 08/18/2017 1059   AST 26 08/18/2017 1059   ALT 32 08/18/2017 1059   ALKPHOS 55 08/18/2017 1059   BILITOT 0.7 08/18/2017 1059   BILIDIR 0.1 02/11/2014 1016   IBILI 0.3 02/11/2014 1016      Component Value Date/Time   TSH 1.680 08/18/2017 1059   TSH 1.80 12/07/2016 1531   TSH 1.77 04/12/2016 1004    ASSESSMENT AND PLAN: Hypertriglyceridemia  Class 1 obesity with serious comorbidity and body mass index (BMI) of 33.0 to 33.9 in adult, unspecified obesity type  PLAN:  Hypertriglyceridemia [name] [name] was informed of the American Heart Association Guidelines emphasizing intensive lifestyle modifications as the first line treatment for hypertriglyceridemia. We discussed many lifestyle modifications today in depth, and [name] [name] will continue to work on decreasing saturated fats such as fatty red meat, butter and many fried foods. He will also increase vegetables and lean protein in his diet and continue to work on exercise and weight loss efforts. We will recheck labs in 1 month and [name] [name] agrees to follow up with our clinic in 2 to 3 weeks.  We spent > than 50% of the 15 minute visit on the counseling as documented in the note.  Obesity [name] [name] is currently in the action stage of change. As such, his goal is to continue with weight loss  efforts He has agreed to keep a food journal with 400 to 600 calories and 40 grams of protein at supper daily and follow the Category 3 plan [name] [name] has been instructed to work up to a goal of 150 minutes of combined cardio and strengthening exercise per week for weight loss and overall health benefits. We discussed the following Behavioral Modification Strategies today: increasing lean protein intake and decreasing simple carbohydrates   [name] [name] has agreed to follow up with our clinic in 2 to 3 weeks. He was informed of the importance of frequent follow up visits to maximize his success with intensive lifestyle modifications for his multiple health conditions.  I, Doreene Nest, am acting as transcriptionist for Dennard Nip, MD  I have reviewed the above documentation for accuracy and completeness, and I agree with the above. -Dennard Nip, MD    OBESITY BEHAVIORAL INTERVENTION VISIT  Today's visit was # 4 out of 22.  Starting weight: 237 lbs Starting date: 08/18/17 Today's weight : 227 lbs Today's date: 10/10/2017 Total lbs lost to date: 10 (Patients must lose 7 lbs in the first 6 months to continue with counseling)   ASK: We discussed the diagnosis of obesity with [name] [name] today and [name] [name] agreed to give [name] permission to discuss obesity behavioral modification therapy today.  ASSESS: [name] [name] has the diagnosis of obesity and his BMI today is 33.51 [name] [name] is in the action stage of change   ADVISE: [name] [name] was educated on the multiple health risks of obesity as well as the benefit of weight loss to improve his health. He was advised of the need for long term treatment and the importance of lifestyle modifications.  AGREE: Multiple dietary modification options and treatment options were discussed and  [name] [name] agreed to keep a food journal with 400 to 600 calories and 40 grams of protein at supper daily and follow the Category 3 plan We discussed the following Behavioral Modification  Strategies today: increasing lean protein intake and decreasing simple carbohydrates

## 2017-10-24 ENCOUNTER — Ambulatory Visit (INDEPENDENT_AMBULATORY_CARE_PROVIDER_SITE_OTHER): Payer: Medicare Other | Admitting: Family Medicine

## 2017-10-24 VITALS — BP 119/70 | HR 83 | Temp 97.5°F | Ht 69.0 in | Wt 227.0 lb

## 2017-10-24 DIAGNOSIS — R7303 Prediabetes: Secondary | ICD-10-CM | POA: Diagnosis not present

## 2017-10-24 DIAGNOSIS — Z6833 Body mass index (BMI) 33.0-33.9, adult: Secondary | ICD-10-CM | POA: Diagnosis not present

## 2017-10-24 DIAGNOSIS — E669 Obesity, unspecified: Secondary | ICD-10-CM

## 2017-10-24 NOTE — Progress Notes (Signed)
Office: [phone]  /  Fax: [phone]   HPI:   Chief Complaint: OBESITY [name] [name] is here to discuss his progress with his obesity treatment plan. He is on the keep a food journal with 400-600 calories and 40 grams of protein at supper daily and follow the Category 3 plan and is following his eating plan approximately 80-85 % of the time. He states he is active for work. [name] [name] had extra challenges with camping and struggled to plan ahead. He did well maintaining weight but he is ready to get back on track.  His weight is 227 lb (103 kg) today and has not lost weight since his last visit. He has lost 10 lbs since starting treatment with [name].  Pre-Diabetes [name] [name] has a diagnosis of pre-diabetes based on his elevated Hgb A1c and was informed this puts him at greater risk of developing diabetes. He is stable and is not taking metformin currently and continues to work on diet and exercise to decrease risk of diabetes. He denies nausea or hypoglycemia and polyphagia is controlled.  ALLERGIES: No Known Allergies  MEDICATIONS: Current Outpatient Prescriptions on File Prior to Visit  Medication Sig Dispense Refill  . aspirin 81 MG chewable tablet Chew 81 mg by mouth every Monday, Wednesday, and Friday.     . Cholecalciferol (VITAMIN D3) 5000 units CAPS Take 1 capsule by mouth daily.    Marland Kitchen gabapentin (NEURONTIN) 300 MG capsule Take 3 capsules (900 mg total) by mouth 3 (three) times daily. 270 capsule 3  . KRILL OIL PO Take 1 capsule by mouth daily.    . Multiple Vitamin (MULTIVITAMIN WITH MINERALS) TABS tablet Take 1 tablet by mouth daily.     No current facility-administered medications on file prior to visit.     PAST MEDICAL HISTORY: Past Medical History:  Diagnosis Date  . BPH (benign prostatic hypertrophy)   . Bradycardia 12/30/2014   HR routinely in mid 40s-50s  . Coronary artery disease (CAD) excluded April 2016   False-positive nuclear stress test suggesting inferior ischemia  .  Elevated PSA   . Encounter for Medicare annual wellness exam 09/22/2013   Sees Dr Wilhemina Bonito of Derm Sees Dr Silvano Rusk of Gastroenterology Sees Dr Kathie Rhodes of [facility] Sees Dr Susa Day of Optometry  . Finger pain, left 04/12/2016   4th   . GERD (gastroesophageal reflux disease)    controlled w/ diet and behavioral changes  . Hypertriglyceridemia 03/16/2015  . Lymphadenitis   . Melanoma of back ([place])    Excised Dr Wilhemina Bonito  . New onset a-fib ([place]) 03/10/2015  . Otitis, externa, infective 08/10/2015  . Palpitations   . Post herpetic neuralgia   . Shingles 07/09/2013  . Sleep apnea 07/30/2011   cpap- 13   . Snoring disorder 07/30/2011  . White coat hypertension     PAST SURGICAL HISTORY: Past Surgical History:  Procedure Laterality Date  . LEFT HEART CATHETERIZATION WITH CORONARY ANGIOGRAM N/A 04/10/2015   Procedure: LEFT HEART CATHETERIZATION WITH  CORONARY ANGIOGRAM;  Surgeon: Leonie Man, MD;  Location: [facility] CATH LAB;  Service: Cardiovascular;  Angiographicallly NORMAL CORONARY ARTERIES  . MASS EXCISION N/A 05/16/2014   Procedure: EXCISION POSTERIOR NECK MASS, RIGHT CHEST WALL MASS AND RIGHT AXILLARY MASS AXILLARY NODE DISSECTION;  Surgeon: Adin Hector, MD;  Location: [facility] ORS;  Service: General;  Laterality: N/A;  . NM MYOVIEW LTD  03/19/2015   FALSE POSITIVE:  INTERMEDIATE RISK. Small sized, moderate intensity inferior ischemic perfusion defect  .  TRANSTHORACIC ECHOCARDIOGRAM  03/20/2015   Normal LV size and function. EF 60-65%. G1 DD. Trivial AI. Borderline aortic root dilation (41 mm), Mild LA dilation.    SOCIAL HISTORY: Social History  Substance Use Topics  . Smoking status: Former Smoker    Types: Pipe, Cigarettes    Quit date: 02/11/1989  . Smokeless tobacco: Never Used  . Alcohol use 4.2 oz/week    7 Glasses of wine per week     Comment: 1 per day -- brandy or wine    FAMILY HISTORY: Family History  Problem Relation Age of Onset  . Cancer  Mother        MM, leukemia  . Obesity Mother   . Emphysema Father   . Obesity Father   . Alcohol abuse Father   . Cancer Sister        brain  . COPD Brother   . Heart disease Paternal Grandmother   . COPD Paternal Grandfather   . Diabetes Sister   . Obesity Sister   . Down syndrome Brother   . Alcohol abuse Other   . Colon cancer Neg Hx     ROS: Review of Systems  Constitutional: Negative for weight loss.  Gastrointestinal: Negative for nausea.  Endo/Heme/Allergies:       Negative hypoglycemia Positive polyphagia    PHYSICAL EXAM: Blood pressure 119/70, pulse 83, temperature (!) 97.5 F (36.4 C), temperature source Oral, height 5\' 9"  (1.753 m), weight 227 lb (103 kg), SpO2 96 %. Body mass index is 33.52 kg/m. Physical Exam  Constitutional: He is oriented to person, place, and time. He appears well-developed and well-nourished.  Cardiovascular: Normal rate.   Pulmonary/Chest: Effort normal.  Musculoskeletal: Normal range of motion.  Neurological: He is oriented to person, place, and time.  Skin: Skin is warm and dry.  Psychiatric: He has a normal mood and affect. His behavior is normal.  Vitals reviewed.   RECENT LABS AND TESTS: BMET    Component Value Date/Time   NA 142 08/18/2017 1059   K 4.9 08/18/2017 1059   CL 104 08/18/2017 1059   CO2 25 08/18/2017 1059   GLUCOSE 90 08/18/2017 1059   GLUCOSE 85 12/07/2016 1531   BUN 19 08/18/2017 1059   CREATININE 1.00 08/18/2017 1059   CREATININE 0.92 04/04/2015 1113   CALCIUM 9.1 08/18/2017 1059   GFRNONAA 74 08/18/2017 1059   GFRAA 85 08/18/2017 1059   Lab Results  Component Value Date   HGBA1C 5.8 (H) 08/18/2017   Lab Results  Component Value Date   INSULIN 17.6 08/18/2017   CBC    Component Value Date/Time   WBC 6.7 08/18/2017 1059   WBC 8.4 12/07/2016 1531   RBC 4.88 08/18/2017 1059   RBC 4.80 12/07/2016 1531   HGB 15.5 08/18/2017 1059   HCT 46.8 08/18/2017 1059   PLT 185.0 12/07/2016 1531   MCV  96 08/18/2017 1059   MCH 31.8 08/18/2017 1059   MCH 28.8 04/04/2015 1113   MCHC 33.1 08/18/2017 1059   MCHC 34.3 12/07/2016 1531   RDW 14.3 08/18/2017 1059   LYMPHSABS 1.7 08/18/2017 1059   MONOABS 0.7 09/13/2013 1350   EOSABS 0.1 08/18/2017 1059   BASOSABS 0.0 08/18/2017 1059   Iron/TIBC/Ferritin/ %Sat No results found for: IRON, TIBC, FERRITIN, IRONPCTSAT Lipid Panel     Component Value Date/Time   CHOL 157 08/18/2017 1059   TRIG 86 08/18/2017 1059   HDL 48 08/18/2017 1059   CHOLHDL 3 04/12/2016 1004   VLDL  21.0 04/12/2016 1004   LDLCALC 92 08/18/2017 1059   Hepatic Function Panel     Component Value Date/Time   PROT 6.7 08/18/2017 1059   ALBUMIN 4.4 08/18/2017 1059   AST 26 08/18/2017 1059   ALT 32 08/18/2017 1059   ALKPHOS 55 08/18/2017 1059   BILITOT 0.7 08/18/2017 1059   BILIDIR 0.1 02/11/2014 1016   IBILI 0.3 02/11/2014 1016      Component Value Date/Time   TSH 1.680 08/18/2017 1059   TSH 1.80 12/07/2016 1531   TSH 1.77 04/12/2016 1004    ASSESSMENT AND PLAN: Prediabetes  Class 1 obesity with serious comorbidity and body mass index (BMI) of 33.0 to 33.9 in adult, unspecified obesity type  PLAN:  Pre-Diabetes [name] [name] will continue to work on weight loss, exercise, and decreasing simple carbohydrates in his diet to help decrease the risk of diabetes. We dicussed metformin including benefits and risks. He was informed that eating too many simple carbohydrates or too many calories at one sitting increases the likelihood of GI side effects. [name] [name] declined metformin for now and a prescription was not written today. We will recheck labs in 1 month and [name] [name] agrees to follow up with our clinic in 3 weeks as directed to monitor his progress.  Obesity [name] [name] is currently in the action stage of change. As such, his goal is to continue with weight loss efforts He has agreed to follow the Category 3 plan [name] [name] has been instructed to work up to a goal of 150 minutes of  combined cardio and strengthening exercise per week for weight loss and overall health benefits. We discussed the following Behavioral Modification Strategies today: increasing lean protein intake, decreasing simple carbohydrates, work on meal planning and easy cooking plans, and holiday eating strategies    [name] [name] has agreed to follow up with our clinic in 3 weeks. He was informed of the importance of frequent follow up visits to maximize his success with intensive lifestyle modifications for his multiple health conditions.  I, [name] [name], am acting as transcriptionist for [name] Nip, MD  I have reviewed the above documentation for accuracy and completeness, and I agree with the above. -[name] Nip, MD      Today's visit was # 5 out of 22.  Starting weight: 237 lbs Starting date: 08/18/17 Today's weight : 227 lbs  Today's date: 10/24/2017 Total lbs lost to date: 10 (Patients must lose 7 lbs in the first 6 months to continue with counseling)   ASK: We discussed the diagnosis of obesity with [name] [name] today and Estefan agreed to give [name] permission to discuss obesity behavioral modification therapy today.  ASSESS: [name] [name] has the diagnosis of obesity and his BMI today is 33.51 [name] [name] is in the action stage of change   ADVISE: [name] [name] was educated on the multiple health risks of obesity as well as the benefit of weight loss to improve his health. He was advised of the need for long term treatment and the importance of lifestyle modifications.  AGREE: Multiple dietary modification options and treatment options were discussed and  [name] [name] agreed to follow the Category 3 plan We discussed the following Behavioral Modification Strategies today: increasing lean protein intake, decreasing simple carbohydrates, work on meal planning and easy cooking plans, and holiday eating strategies

## 2017-11-07 ENCOUNTER — Ambulatory Visit (INDEPENDENT_AMBULATORY_CARE_PROVIDER_SITE_OTHER): Payer: Medicare Other

## 2017-11-07 DIAGNOSIS — Z23 Encounter for immunization: Secondary | ICD-10-CM | POA: Diagnosis not present

## 2017-11-10 DIAGNOSIS — H524 Presbyopia: Secondary | ICD-10-CM | POA: Diagnosis not present

## 2017-11-10 DIAGNOSIS — H2513 Age-related nuclear cataract, bilateral: Secondary | ICD-10-CM | POA: Diagnosis not present

## 2017-11-15 ENCOUNTER — Encounter (INDEPENDENT_AMBULATORY_CARE_PROVIDER_SITE_OTHER): Payer: Self-pay | Admitting: Physician Assistant

## 2017-11-15 ENCOUNTER — Ambulatory Visit (INDEPENDENT_AMBULATORY_CARE_PROVIDER_SITE_OTHER): Payer: Medicare Other | Admitting: Physician Assistant

## 2017-11-15 VITALS — BP 109/73 | HR 75 | Temp 97.6°F | Ht 69.0 in | Wt 228.0 lb

## 2017-11-15 DIAGNOSIS — Z6833 Body mass index (BMI) 33.0-33.9, adult: Secondary | ICD-10-CM

## 2017-11-15 DIAGNOSIS — E669 Obesity, unspecified: Secondary | ICD-10-CM

## 2017-11-15 DIAGNOSIS — R7303 Prediabetes: Secondary | ICD-10-CM

## 2017-11-15 NOTE — Progress Notes (Addendum)
Office: [phone]  /  Fax: [phone]   HPI:   Chief Complaint: OBESITY [name] [name] is here to discuss his progress with his obesity treatment plan. He is on the Category 3 plan and is following his eating plan approximately 90 % of the time. He states he is exercising 0 minutes 0 times per week. [name] [name] is retaining fluid, he admits he does not drink much water. He states his hunger is well controlled. [name] [name] would like holiday eating strategies. His weight is 228 lb (103.4 kg) today and has had a weight gain of 1 pound over a period of 3 weeks since his last visit. He has lost 9 lbs since starting treatment with [name].  Pre-Diabetes [name] [name] has a diagnosis of pre-diabetes based on his elevated Hgb A1c and was informed this puts him at greater risk of developing diabetes. He is not taking metformin currently and continues to work on diet and exercise to decrease risk of diabetes. He denies nausea, polyphagia or hypoglycemia.  ALLERGIES: No Known Allergies  MEDICATIONS: Current Outpatient Medications on File Prior to Visit  Medication Sig Dispense Refill  . aspirin 81 MG chewable tablet Chew 81 mg by mouth every Monday, Wednesday, and Friday.     . Cholecalciferol (VITAMIN D3) 5000 units CAPS Take 1 capsule by mouth daily.    Marland Kitchen gabapentin (NEURONTIN) 300 MG capsule Take 3 capsules (900 mg total) by mouth 3 (three) times daily. 270 capsule 3  . KRILL OIL PO Take 1 capsule by mouth daily.    . Multiple Vitamin (MULTIVITAMIN WITH MINERALS) TABS tablet Take 1 tablet by mouth daily.     No current facility-administered medications on file prior to visit.     PAST MEDICAL HISTORY: Past Medical History:  Diagnosis Date  . BPH (benign prostatic hypertrophy)   . Bradycardia 12/30/2014   HR routinely in mid 40s-50s  . Coronary artery disease (CAD) excluded April 2016   False-positive nuclear stress test suggesting inferior ischemia  . Elevated PSA   . Encounter for Medicare annual wellness exam  09/22/2013   Sees Dr Wilhemina Bonito of Derm Sees Dr Silvano Rusk of Gastroenterology Sees Dr Kathie Rhodes of [facility] Sees Dr Susa Day of Optometry  . Finger pain, left 04/12/2016   4th   . GERD (gastroesophageal reflux disease)    controlled w/ diet and behavioral changes  . Hypertriglyceridemia 03/16/2015  . Lymphadenitis   . Melanoma of back ([place])    Excised Dr Wilhemina Bonito  . New onset a-fib ([place]) 03/10/2015  . Otitis, externa, infective 08/10/2015  . Palpitations   . Post herpetic neuralgia   . Shingles 07/09/2013  . Sleep apnea 07/30/2011   cpap- 13   . Snoring disorder 07/30/2011  . White coat hypertension     PAST SURGICAL HISTORY: Past Surgical History:  Procedure Laterality Date  . LEFT HEART CATHETERIZATION WITH CORONARY ANGIOGRAM N/A 04/10/2015   Procedure: LEFT HEART CATHETERIZATION WITH  CORONARY ANGIOGRAM;  Surgeon: Leonie Man, MD;  Location: [facility] CATH LAB;  Service: Cardiovascular;  Angiographicallly NORMAL CORONARY ARTERIES  . MASS EXCISION N/A 05/16/2014   Procedure: EXCISION POSTERIOR NECK MASS, RIGHT CHEST WALL MASS AND RIGHT AXILLARY MASS AXILLARY NODE DISSECTION;  Surgeon: Adin Hector, MD;  Location: [facility] ORS;  Service: General;  Laterality: N/A;  . NM MYOVIEW LTD  03/19/2015   FALSE POSITIVE:  INTERMEDIATE RISK. Small sized, moderate intensity inferior ischemic perfusion defect  . TRANSTHORACIC ECHOCARDIOGRAM  03/20/2015   Normal LV size  and function. EF 60-65%. G1 DD. Trivial AI. Borderline aortic root dilation (41 mm), Mild LA dilation.    SOCIAL HISTORY: Social History   Tobacco Use  . Smoking status: Former Smoker    Types: Pipe, Cigarettes    Last attempt to quit: 02/11/1989    Years since quitting: 28.7  . Smokeless tobacco: Never Used  Substance Use Topics  . Alcohol use: Yes    Alcohol/week: 4.2 oz    Types: 7 Glasses of wine per week    Comment: 1 per day -- brandy or wine  . Drug use: No    FAMILY HISTORY: Family History  Problem  Relation Age of Onset  . Cancer Mother        MM, leukemia  . Obesity Mother   . Emphysema Father   . Obesity Father   . Alcohol abuse Father   . Cancer Sister        brain  . COPD Brother   . Heart disease Paternal Grandmother   . COPD Paternal Grandfather   . Diabetes Sister   . Obesity Sister   . Down syndrome Brother   . Alcohol abuse Other   . Colon cancer Neg Hx     ROS: Review of Systems  Constitutional: Negative for weight loss.  Gastrointestinal: Negative for nausea.  Endo/Heme/Allergies:       Negative polyphagia Negative hypoglycemia    PHYSICAL EXAM: Blood pressure 109/73, pulse 75, temperature 97.6 F (36.4 C), height 5\' 9"  (1.753 m), weight 228 lb (103.4 kg), SpO2 97 %. Body mass index is 33.67 kg/m. Physical Exam  Constitutional: He is oriented to person, place, and time. He appears well-developed and well-nourished.  Cardiovascular: Normal rate.  Pulmonary/Chest: Effort normal.  Musculoskeletal: Normal range of motion.  Neurological: He is oriented to person, place, and time.  Skin: Skin is warm and dry.  Psychiatric: He has a normal mood and affect. His behavior is normal.  Vitals reviewed.   RECENT LABS AND TESTS: BMET    Component Value Date/Time   NA 142 08/18/2017 1059   K 4.9 08/18/2017 1059   CL 104 08/18/2017 1059   CO2 25 08/18/2017 1059   GLUCOSE 90 08/18/2017 1059   GLUCOSE 85 12/07/2016 1531   BUN 19 08/18/2017 1059   CREATININE 1.00 08/18/2017 1059   CREATININE 0.92 04/04/2015 1113   CALCIUM 9.1 08/18/2017 1059   GFRNONAA 74 08/18/2017 1059   GFRAA 85 08/18/2017 1059   Lab Results  Component Value Date   HGBA1C 5.8 (H) 08/18/2017   Lab Results  Component Value Date   INSULIN 17.6 08/18/2017   CBC    Component Value Date/Time   WBC 6.7 08/18/2017 1059   WBC 8.4 12/07/2016 1531   RBC 4.88 08/18/2017 1059   RBC 4.80 12/07/2016 1531   HGB 15.5 08/18/2017 1059   HCT 46.8 08/18/2017 1059   PLT 185.0 12/07/2016 1531     MCV 96 08/18/2017 1059   MCH 31.8 08/18/2017 1059   MCH 28.8 04/04/2015 1113   MCHC 33.1 08/18/2017 1059   MCHC 34.3 12/07/2016 1531   RDW 14.3 08/18/2017 1059   LYMPHSABS 1.7 08/18/2017 1059   MONOABS 0.7 09/13/2013 1350   EOSABS 0.1 08/18/2017 1059   BASOSABS 0.0 08/18/2017 1059   Iron/TIBC/Ferritin/ %Sat No results found for: IRON, TIBC, FERRITIN, IRONPCTSAT Lipid Panel     Component Value Date/Time   CHOL 157 08/18/2017 1059   TRIG 86 08/18/2017 1059   HDL 48 08/18/2017  1059   CHOLHDL 3 04/12/2016 1004   VLDL 21.0 04/12/2016 1004   LDLCALC 92 08/18/2017 1059   Hepatic Function Panel     Component Value Date/Time   PROT 6.7 08/18/2017 1059   ALBUMIN 4.4 08/18/2017 1059   AST 26 08/18/2017 1059   ALT 32 08/18/2017 1059   ALKPHOS 55 08/18/2017 1059   BILITOT 0.7 08/18/2017 1059   BILIDIR 0.1 02/11/2014 1016   IBILI 0.3 02/11/2014 1016      Component Value Date/Time   TSH 1.680 08/18/2017 1059   TSH 1.80 12/07/2016 1531   TSH 1.77 04/12/2016 1004    ASSESSMENT AND PLAN: Prediabetes  Class 1 obesity with serious comorbidity and body mass index (BMI) of 33.0 to 33.9 in adult, unspecified obesity type  PLAN:  Pre-Diabetes Jeremaih will continue to work on weight loss, exercise, and decreasing simple carbohydrates in his diet to help decrease the risk of diabetes. We dicussed metformin including benefits and risks. He was informed that eating too many simple carbohydrates or too many calories at one sitting increases the likelihood of GI side effects. [name] [name] declined metformin for now and a prescription was not written today. [name] [name] agreed to follow up with [name] as directed to monitor his progress.  We spent > than 50% of the 15 minute visit on the counseling as documented in the note.  Obesity [name] [name] is currently in the action stage of change. As such, his goal is to continue with weight loss efforts He has agreed to follow the Category 3 plan [name] [name] has been  instructed to work up to a goal of 150 minutes of combined cardio and strengthening exercise per week for weight loss and overall health benefits. We discussed the following Behavioral Modification Strategies today: holiday eating strategies and increase H2O intake  [name] [name] has agreed to follow up with our clinic in 2 weeks. He was informed of the importance of frequent follow up visits to maximize his success with intensive lifestyle modifications for his multiple health conditions.  I, Doreene Nest, am acting as transcriptionist for Lacy Duverney, PA-C  I have reviewed the above documentation for accuracy and completeness, and I agree with the above. -Lacy Duverney, PA-C  I have reviewed the above note and agree with the plan. -Dennard Nip, MD   OBESITY BEHAVIORAL INTERVENTION VISIT  Today's visit was # 6 out of 22.  Starting weight: 237 lbs Starting date: 08/18/17 Today's weight : 228 lbs Today's date: 11/15/2017 Total lbs lost to date: 9 (Patients must lose 7 lbs in the first 6 months to continue with counseling)   ASK: We discussed the diagnosis of obesity with [name] Lei "[name]" today and [name] [name] agreed to give [name] permission to discuss obesity behavioral modification therapy today.  ASSESS: Amit has the diagnosis of obesity and his BMI today is 33.65 [name] [name] is in the action stage of change   ADVISE: [name] [name] was educated on the multiple health risks of obesity as well as the benefit of weight loss to improve his health. He was advised of the need for long term treatment and the importance of lifestyle modifications.  AGREE: Multiple dietary modification options and treatment options were discussed and  [name] [name] agreed to follow the Category 3 plan We discussed the following Behavioral Modification Strategies today: holiday eating strategies and increase H2O intake

## 2017-12-01 ENCOUNTER — Ambulatory Visit (INDEPENDENT_AMBULATORY_CARE_PROVIDER_SITE_OTHER): Payer: Medicare Other | Admitting: Physician Assistant

## 2017-12-01 ENCOUNTER — Encounter (INDEPENDENT_AMBULATORY_CARE_PROVIDER_SITE_OTHER): Payer: Self-pay | Admitting: Physician Assistant

## 2017-12-01 VITALS — BP 106/78 | HR 67 | Temp 97.6°F | Ht 69.0 in | Wt 225.0 lb

## 2017-12-01 DIAGNOSIS — Z6833 Body mass index (BMI) 33.0-33.9, adult: Secondary | ICD-10-CM | POA: Diagnosis not present

## 2017-12-01 DIAGNOSIS — R7303 Prediabetes: Secondary | ICD-10-CM

## 2017-12-01 DIAGNOSIS — E669 Obesity, unspecified: Secondary | ICD-10-CM | POA: Diagnosis not present

## 2017-12-01 NOTE — Progress Notes (Signed)
Office: [phone]  /  Fax: [phone]   HPI:   Chief Complaint: OBESITY Maxi is here to discuss his progress with his obesity treatment plan. He is on the Category 3 plan and is following his eating plan approximately 85 % of the time. He states he is doing general fitness and aerobics for 60 minutes 2 times per week. Karry continues to do well with weight loss. He is mindful of his eating and he states his hunger is well controlled. He would like more variety with his meals.  His weight is 225 lb (102.1 kg) today and has had a weight loss of 3 pounds over a period of 2 to 3 weeks since his last visit. He has lost 12 lbs since starting treatment with [name].  Pre-Diabetes Natan has a diagnosis of pre-diabetes based on his elevated Hgb A1c and was informed this puts him at greater risk of developing diabetes. He is not taking metformin currently and continues to work on diet and exercise to decrease risk of diabetes. He denies nausea, polyphagia, or hypoglycemia.  ALLERGIES: No Known Allergies  MEDICATIONS: Current Outpatient Medications on File Prior to Visit  Medication Sig Dispense Refill  . aspirin 81 MG chewable tablet Chew 81 mg by mouth every Monday, Wednesday, and Friday.     . Cholecalciferol (VITAMIN D3) 5000 units CAPS Take 1 capsule by mouth daily.    Marland Kitchen gabapentin (NEURONTIN) 300 MG capsule Take 3 capsules (900 mg total) by mouth 3 (three) times daily. 270 capsule 3  . KRILL OIL PO Take 1 capsule by mouth daily.    . Multiple Vitamin (MULTIVITAMIN WITH MINERALS) TABS tablet Take 1 tablet by mouth daily.     No current facility-administered medications on file prior to visit.     PAST MEDICAL HISTORY: Past Medical History:  Diagnosis Date  . BPH (benign prostatic hypertrophy)   . Bradycardia 12/30/2014   HR routinely in mid 40s-50s  . Coronary artery disease (CAD) excluded April 2016   False-positive nuclear stress test suggesting inferior ischemia  . Elevated PSA     . Encounter for Medicare annual wellness exam 09/22/2013   Sees Dr Wilhemina Bonito of Derm Sees Dr Silvano Rusk of Gastroenterology Sees Dr Kathie Rhodes of [facility] Sees Dr Susa Day of Optometry  . Finger pain, left 04/12/2016   4th   . GERD (gastroesophageal reflux disease)    controlled w/ diet and behavioral changes  . Hypertriglyceridemia 03/16/2015  . Lymphadenitis   . Melanoma of back ([place])    Excised Dr Wilhemina Bonito  . New onset a-fib ([place]) 03/10/2015  . Otitis, externa, infective 08/10/2015  . Palpitations   . Post herpetic neuralgia   . Shingles 07/09/2013  . Sleep apnea 07/30/2011   cpap- 13   . Snoring disorder 07/30/2011  . White coat hypertension     PAST SURGICAL HISTORY: Past Surgical History:  Procedure Laterality Date  . LEFT HEART CATHETERIZATION WITH CORONARY ANGIOGRAM N/A 04/10/2015   Procedure: LEFT HEART CATHETERIZATION WITH  CORONARY ANGIOGRAM;  Surgeon: Leonie Man, MD;  Location: [facility] CATH LAB;  Service: Cardiovascular;  Angiographicallly NORMAL CORONARY ARTERIES  . MASS EXCISION N/A 05/16/2014   Procedure: EXCISION POSTERIOR NECK MASS, RIGHT CHEST WALL MASS AND RIGHT AXILLARY MASS AXILLARY NODE DISSECTION;  Surgeon: Adin Hector, MD;  Location: [facility] ORS;  Service: General;  Laterality: N/A;  . NM MYOVIEW LTD  03/19/2015   FALSE POSITIVE:  INTERMEDIATE RISK. Small sized, moderate intensity inferior  ischemic perfusion defect  . TRANSTHORACIC ECHOCARDIOGRAM  03/20/2015   Normal LV size and function. EF 60-65%. G1 DD. Trivial AI. Borderline aortic root dilation (41 mm), Mild LA dilation.    SOCIAL HISTORY: Social History   Tobacco Use  . Smoking status: Former Smoker    Types: Pipe, Cigarettes    Last attempt to quit: 02/11/1989    Years since quitting: 28.8  . Smokeless tobacco: Never Used  Substance Use Topics  . Alcohol use: Yes    Alcohol/week: 4.2 oz    Types: 7 Glasses of wine per week    Comment: 1 per day -- brandy or wine  . Drug use: No     FAMILY HISTORY: Family History  Problem Relation Age of Onset  . Cancer Mother        MM, leukemia  . Obesity Mother   . Emphysema Father   . Obesity Father   . Alcohol abuse Father   . Cancer Sister        brain  . COPD Brother   . Heart disease Paternal Grandmother   . COPD Paternal Grandfather   . Diabetes Sister   . Obesity Sister   . Down syndrome Brother   . Alcohol abuse Other   . Colon cancer Neg Hx     ROS: Review of Systems  Constitutional: Positive for weight loss.  Gastrointestinal: Negative for nausea.  Endo/Heme/Allergies:       Negative polyphagia Negative hypoglycemia    PHYSICAL EXAM: Blood pressure 106/78, pulse 67, temperature 97.6 F (36.4 C), temperature source Oral, height 5\' 9"  (1.753 m), weight 225 lb (102.1 kg), SpO2 96 %. Body mass index is 33.23 kg/m. Physical Exam  Constitutional: He is oriented to person, place, and time. He appears well-developed and well-nourished.  Cardiovascular: Normal rate.  Pulmonary/Chest: Effort normal.  Musculoskeletal: Normal range of motion.  Neurological: He is oriented to person, place, and time.  Skin: Skin is warm and dry.  Psychiatric: He has a normal mood and affect. His behavior is normal.  Vitals reviewed.   RECENT LABS AND TESTS: BMET    Component Value Date/Time   NA 142 08/18/2017 1059   K 4.9 08/18/2017 1059   CL 104 08/18/2017 1059   CO2 25 08/18/2017 1059   GLUCOSE 90 08/18/2017 1059   GLUCOSE 85 12/07/2016 1531   BUN 19 08/18/2017 1059   CREATININE 1.00 08/18/2017 1059   CREATININE 0.92 04/04/2015 1113   CALCIUM 9.1 08/18/2017 1059   GFRNONAA 74 08/18/2017 1059   GFRAA 85 08/18/2017 1059   Lab Results  Component Value Date   HGBA1C 5.8 (H) 08/18/2017   Lab Results  Component Value Date   INSULIN 17.6 08/18/2017   CBC    Component Value Date/Time   WBC 6.7 08/18/2017 1059   WBC 8.4 12/07/2016 1531   RBC 4.88 08/18/2017 1059   RBC 4.80 12/07/2016 1531   HGB 15.5  08/18/2017 1059   HCT 46.8 08/18/2017 1059   PLT 185.0 12/07/2016 1531   MCV 96 08/18/2017 1059   MCH 31.8 08/18/2017 1059   MCH 28.8 04/04/2015 1113   MCHC 33.1 08/18/2017 1059   MCHC 34.3 12/07/2016 1531   RDW 14.3 08/18/2017 1059   LYMPHSABS 1.7 08/18/2017 1059   MONOABS 0.7 09/13/2013 1350   EOSABS 0.1 08/18/2017 1059   BASOSABS 0.0 08/18/2017 1059   Iron/TIBC/Ferritin/ %Sat No results found for: IRON, TIBC, FERRITIN, IRONPCTSAT Lipid Panel     Component Value Date/Time  CHOL 157 08/18/2017 1059   TRIG 86 08/18/2017 1059   HDL 48 08/18/2017 1059   CHOLHDL 3 04/12/2016 1004   VLDL 21.0 04/12/2016 1004   LDLCALC 92 08/18/2017 1059   Hepatic Function Panel     Component Value Date/Time   PROT 6.7 08/18/2017 1059   ALBUMIN 4.4 08/18/2017 1059   AST 26 08/18/2017 1059   ALT 32 08/18/2017 1059   ALKPHOS 55 08/18/2017 1059   BILITOT 0.7 08/18/2017 1059   BILIDIR 0.1 02/11/2014 1016   IBILI 0.3 02/11/2014 1016      Component Value Date/Time   TSH 1.680 08/18/2017 1059   TSH 1.80 12/07/2016 1531   TSH 1.77 04/12/2016 1004    ASSESSMENT AND PLAN: Prediabetes  Class 1 obesity with serious comorbidity and body mass index (BMI) of 33.0 to 33.9 in adult, unspecified obesity type  PLAN:  Pre-Diabetes Kelson will continue to work on diet, weight loss, exercise, and decreasing simple carbohydrates in his diet to help decrease the risk of diabetes. We dicussed metformin including benefits and risks. He was informed that eating too many simple carbohydrates or too many calories at one sitting increases the likelihood of GI side effects. Lukasz declined metformin for now and a prescription was not written today. Mehmet agreed to follow up with our clinic in 3 weeks as directed to monitor his progress.  We spent > than 50% of the 15 minute visit on the counseling as documented in the note.  Obesity Jaysiah is currently in the action stage of change. As such, his goal is to  continue with weight loss efforts He has agreed to change to keep a food journal with 1500 calories and 95 grams of protein daily Trellis has been instructed to work up to a goal of 150 minutes of combined cardio and strengthening exercise per week for weight loss and overall health benefits. We discussed the following Behavioral Modification Strategies today: increasing lean protein intake and keep a strict food journal   Grayton has agreed to follow up with our clinic in 3 weeks. He was informed of the importance of frequent follow up visits to maximize his success with intensive lifestyle modifications for his multiple health conditions.  I, Trixie Dredge, am acting as transcriptionist for Lacy Duverney, PA-C  I have reviewed the above documentation for accuracy and completeness, and I agree with the above. -Lacy Duverney, PA-C  I have reviewed the above note and agree with the plan. -Dennard Nip, MD     Today's visit was # 7 out of 22.  Starting weight: 237 lbs Starting date: 08/18/17 Today's weight : 225 lbs  Today's date: 12/01/2017 Total lbs lost to date: 12 (Patients must lose 7 lbs in the first 6 months to continue with counseling)   ASK: We discussed the diagnosis of obesity with Lucianne Lei "[name]" today and Basim agreed to give [name] permission to discuss obesity behavioral modification therapy today.  ASSESS: Handsome has the diagnosis of obesity and his BMI today is 33.21 Matas is in the action stage of change   ADVISE: Cashawn was educated on the multiple health risks of obesity as well as the benefit of weight loss to improve his health. He was advised of the need for long term treatment and the importance of lifestyle modifications.  AGREE: Multiple dietary modification options and treatment options were discussed and  Traves agreed to keep a food journal with 1500 calories and 95 grams of protein daily We discussed the  following Behavioral Modification Strategies today:  increasing lean protein intake and keep a strict food journal

## 2017-12-06 DIAGNOSIS — H2511 Age-related nuclear cataract, right eye: Secondary | ICD-10-CM | POA: Diagnosis not present

## 2017-12-06 DIAGNOSIS — H25811 Combined forms of age-related cataract, right eye: Secondary | ICD-10-CM | POA: Diagnosis not present

## 2017-12-06 DIAGNOSIS — H25011 Cortical age-related cataract, right eye: Secondary | ICD-10-CM | POA: Diagnosis not present

## 2017-12-06 HISTORY — PX: EYE SURGERY: SHX253

## 2017-12-08 ENCOUNTER — Ambulatory Visit (INDEPENDENT_AMBULATORY_CARE_PROVIDER_SITE_OTHER): Payer: Medicare Other | Admitting: Family Medicine

## 2017-12-08 ENCOUNTER — Encounter: Payer: Self-pay | Admitting: Family Medicine

## 2017-12-08 VITALS — BP 98/62 | HR 68 | Temp 97.6°F | Resp 18 | Ht 70.0 in | Wt 228.0 lb

## 2017-12-08 DIAGNOSIS — E781 Pure hyperglyceridemia: Secondary | ICD-10-CM | POA: Diagnosis not present

## 2017-12-08 DIAGNOSIS — E559 Vitamin D deficiency, unspecified: Secondary | ICD-10-CM

## 2017-12-08 DIAGNOSIS — Z0000 Encounter for general adult medical examination without abnormal findings: Secondary | ICD-10-CM | POA: Diagnosis not present

## 2017-12-08 DIAGNOSIS — G4733 Obstructive sleep apnea (adult) (pediatric): Secondary | ICD-10-CM | POA: Diagnosis not present

## 2017-12-08 DIAGNOSIS — B0229 Other postherpetic nervous system involvement: Secondary | ICD-10-CM | POA: Diagnosis not present

## 2017-12-08 DIAGNOSIS — R739 Hyperglycemia, unspecified: Secondary | ICD-10-CM | POA: Diagnosis not present

## 2017-12-08 DIAGNOSIS — E669 Obesity, unspecified: Secondary | ICD-10-CM

## 2017-12-08 DIAGNOSIS — Z9989 Dependence on other enabling machines and devices: Secondary | ICD-10-CM

## 2017-12-08 DIAGNOSIS — R001 Bradycardia, unspecified: Secondary | ICD-10-CM

## 2017-12-08 MED ORDER — GABAPENTIN 300 MG PO CAPS: 900.0000 mg | ORAL_CAPSULE | Freq: Three times a day (TID) | ORAL | 3 refills | Status: DC

## 2017-12-08 NOTE — Patient Instructions (Addendum)
Lidocaine gel  As needed    Preventive Care 65 Years and Older, Male Preventive care refers to lifestyle choices and visits with your health care provider that can promote health and wellness. What does preventive care include?  A yearly physical exam. This is also called an annual well check.  Dental exams once or twice a year.  Routine eye exams. Ask your health care provider how often you should have your eyes checked.  Personal lifestyle choices, including: ? Daily care of your teeth and gums. ? Regular physical activity. ? Eating a healthy diet. ? Avoiding tobacco and drug use. ? Limiting alcohol use. ? Practicing safe sex. ? Taking low doses of aspirin every day. ? Taking vitamin and mineral supplements as recommended by your health care provider. What happens during an annual well check? The services and screenings done by your health care provider during your annual well check will depend on your age, overall health, lifestyle risk factors, and family history of disease. Counseling Your health care provider may ask you questions about your:  Alcohol use.  Tobacco use.  Drug use.  Emotional well-being.  Home and relationship well-being.  Sexual activity.  Eating habits.  History of falls.  Memory and ability to understand (cognition).  Work and work Statistician.  Screening You may have the following tests or measurements:  Height, weight, and BMI.  Blood pressure.  Lipid and cholesterol levels. These may be checked every 5 years, or more frequently if you are over 7 years old.  Skin check.  Lung cancer screening. You may have this screening every year starting at age 74 if you have a 30-pack-year history of smoking and currently smoke or have quit within the past 15 years.  Fecal occult blood test (FOBT) of the stool. You may have this test every year starting at age 74.  Flexible sigmoidoscopy or colonoscopy. You may have a sigmoidoscopy every 5  years or a colonoscopy every 10 years starting at age 74.  Prostate cancer screening. Recommendations will vary depending on your family history and other risks.  Hepatitis C blood test.  Hepatitis B blood test.  Sexually transmitted disease (STD) testing.  Diabetes screening. This is done by checking your blood sugar (glucose) after you have not eaten for a while (fasting). You may have this done every 1-3 years.  Abdominal aortic aneurysm (AAA) screening. You may need this if you are a current or former smoker.  Osteoporosis. You may be screened starting at age 74 if you are at high risk.  Talk with your health care provider about your test results, treatment options, and if necessary, the need for more tests. Vaccines Your health care provider may recommend certain vaccines, such as:  Influenza vaccine. This is recommended every year.  Tetanus, diphtheria, and acellular pertussis (Tdap, Td) vaccine. You may need a Td booster every 10 years.  Varicella vaccine. You may need this if you have not been vaccinated.  Zoster vaccine. You may need this after age 25.  Measles, mumps, and rubella (MMR) vaccine. You may need at least one dose of MMR if you were born in 1957 or later. You may also need a second dose.  Pneumococcal 13-valent conjugate (PCV13) vaccine. One dose is recommended after age 74.  Pneumococcal polysaccharide (PPSV23) vaccine. One dose is recommended after age 74.  Meningococcal vaccine. You may need this if you have certain conditions.  Hepatitis A vaccine. You may need this if you have certain conditions or if  you travel or work in places where you may be exposed to hepatitis A.  Hepatitis B vaccine. You may need this if you have certain conditions or if you travel or work in places where you may be exposed to hepatitis B.  Haemophilus influenzae type b (Hib) vaccine. You may need this if you have certain risk factors.  Talk to your health care provider  about which screenings and vaccines you need and how often you need them. This information is not intended to replace advice given to you by your health care provider. Make sure you discuss any questions you have with your health care provider. Document Released: 01/09/2016 Document Revised: 09/01/2016 Document Reviewed: 10/14/2015 Elsevier Interactive Patient Education  2017 [employer].

## 2017-12-08 NOTE — Assessment & Plan Note (Signed)
Take Vitamin D 5000 IU daily

## 2017-12-08 NOTE — Assessment & Plan Note (Signed)
Encouraged DASH diet, decrease po intake and increase exercise as tolerated. Needs 7-8 hours of sleep nightly. Avoid trans fats, eat small, frequent meals every 4-5 hours with lean proteins, complex carbs and healthy fats. Minimize simple carbs, GMO foods. 

## 2017-12-08 NOTE — Progress Notes (Signed)
Subjective:  I acted as a Education administrator for Dr. Charlett Blake. [name] [name], [name] [name]  Patient ID: [name] [name]", male    DOB: [birth year], 76 y.o.   MRN: [id number]  No chief complaint on file.   HPI  Patient is in today for an annual exam and follow-up on chronic medical conditions including hyperlipidemia, obesity and postherpetic neuralgia.  On most days the gabapentin controls his postherpetic neuralgia fairly well but on particularly cold days especially his pain is worse and he has to rest for a couple of hours to get relief. He also notes loose fitting clothing irritates his skin. Otherwise he feels well. No recent febrile illness or hospitalization. Denies CP/palp/SOB/HA/congestion/fevers/GI or GU c/o. Taking meds as prescribed. Is doing well with activities of daily living. He agrees to bring in his HCP documents next week.  Patient Care Team: [name] Lukes, MD as PCP - General (Family Medicine) [name] Mayer, MD as Consulting Physician (Gastroenterology) [name] Sensing, MD as Consulting Physician (Dermatology) [name] Rhodes, MD as Consulting Physician (Urology) [name] Man, MD as Consulting Physician (Cardiology) [name] [name], DDS as Consulting Physician (Dentistry)   Past Medical History:  Diagnosis Date  . BPH (benign prostatic hypertrophy)   . Bradycardia 12/30/2014   HR routinely in mid 40s-50s  . Coronary artery disease (CAD) excluded April 2016   False-positive nuclear stress test suggesting inferior ischemia  . Elevated PSA   . Encounter for Medicare annual wellness exam 09/22/2013   Sees Dr [name] [name] of Derm Sees Dr [name] [name] of Gastroenterology Sees Dr [name] [name] of [facility] Sees Dr [name] [name] of Optometry  . Finger pain, left 04/12/2016   4th   . GERD (gastroesophageal reflux disease)    controlled w/ diet and behavioral changes  . Hypertriglyceridemia 03/16/2015  . Lymphadenitis   . Melanoma of back ([place])    Excised Dr [name] [name]  . New onset a-fib  ([place]) 03/10/2015  . Otitis, externa, infective 08/10/2015  . Palpitations   . Post herpetic neuralgia   . Shingles 07/09/2013  . Sleep apnea 07/30/2011   cpap- 13   . Snoring disorder 07/30/2011  . White coat hypertension     Past Surgical History:  Procedure Laterality Date  . CATARACT EXTRACTION Right   . EYE SURGERY Right 12/06/2017   cataract by Dr Gillian Scarce  . LEFT HEART CATHETERIZATION WITH CORONARY ANGIOGRAM N/A 04/10/2015   Procedure: LEFT HEART CATHETERIZATION WITH  CORONARY ANGIOGRAM;  Surgeon: [name] Man, MD;  Location: [facility] CATH LAB;  Service: Cardiovascular;  Angiographicallly NORMAL CORONARY ARTERIES  . MASS EXCISION N/A 05/16/2014   Procedure: EXCISION POSTERIOR NECK MASS, RIGHT CHEST WALL MASS AND RIGHT AXILLARY MASS AXILLARY NODE DISSECTION;  Surgeon: Adin Hector, MD;  Location: [facility] ORS;  Service: General;  Laterality: N/A;  . NM MYOVIEW LTD  03/19/2015   FALSE POSITIVE:  INTERMEDIATE RISK. Small sized, moderate intensity inferior ischemic perfusion defect  . TRANSTHORACIC ECHOCARDIOGRAM  03/20/2015   Normal LV size and function. EF 60-65%. G1 DD. Trivial AI. Borderline aortic root dilation (41 mm), Mild LA dilation.    Family History  Problem Relation Age of Onset  . Cancer Mother        MM, leukemia  . Obesity Mother   . Emphysema Father   . Obesity Father   . Alcohol abuse Father   . Cancer Sister        brain  . COPD Brother   . Heart disease Paternal Grandmother   .  COPD Paternal Grandfather   . Diabetes Sister   . Obesity Sister   . Down syndrome Brother   . Alcohol abuse Other   . Colon cancer Neg Hx     Social History   Socioeconomic History  . Marital status: Married    Spouse name: [name] [name]  . Number of children: 2  . Years of education: Not on file  . Highest education level: Not on file  Social Needs  . Financial resource strain: Not on file  . Food insecurity - worry: Not on file  . Food insecurity - inability: Not on file  .  Transportation needs - medical: Not on file  . Transportation needs - non-medical: Not on file  Occupational History  . Occupation: retired    Comment: truck Geophysicist/field seismologist  Tobacco Use  . Smoking status: Former Smoker    Types: Pipe, Cigarettes    Last attempt to quit: 02/11/1989    Years since quitting: 28.8  . Smokeless tobacco: Never Used  Substance and Sexual Activity  . Alcohol use: Yes    Alcohol/week: 4.2 oz    Types: 7 Glasses of wine per week    Comment: 1 per [name] -- brandy or wine  . Drug use: No  . Sexual activity: Yes    Comment: lives with wife, retired from [employer] driving, no dietary restrictions  Other Topics Concern  . Not on file  Social History Narrative   He is a married father of 2, and grandfather of 25. He has been married to his wife [name] for 51 years. He previously lived in [place] but has moved with his wife to [place], [place] to be close to his daughter [name] and her family. [name] is our Nurse, adult.   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 He quit smoking in 1990. He has 6-7 glasses of brandy or wine a week.    He exercises regularly with low impact aerobics 2 days a week and daily walks of 30-45 minutes. His exercise sessions or 60 minutes. He will do some type of exercise at least 7 days a week and is very active doing yard work and wood work.    Outpatient Medications Prior to Visit  Medication Sig Dispense Refill  . aspirin 81 MG chewable tablet Chew 81 mg by mouth every Monday, Wednesday, and Friday.     . Cholecalciferol (VITAMIN D3) 5000 units CAPS Take 1 capsule by mouth daily.    Marland Kitchen KRILL OIL PO Take 1 capsule by mouth daily.    . Multiple Vitamin (MULTIVITAMIN WITH MINERALS) TABS tablet Take 1 tablet by mouth daily.    Marland Kitchen gabapentin (NEURONTIN) 300 MG capsule Take 3 capsules (900 mg total) by mouth 3 (three) times daily. 270 capsule 3   No facility-administered medications prior to visit.      No Known Allergies  Review of Systems  Constitutional: Negative for chills, fever and malaise/fatigue.  HENT: Negative for congestion and hearing loss.   Eyes: Negative for discharge.  Respiratory: Negative for cough, sputum production and shortness of breath.   Cardiovascular: Negative for chest pain, palpitations and leg swelling.  Gastrointestinal: Negative for abdominal pain, blood in stool, constipation, diarrhea, heartburn, melena, nausea and vomiting.  Genitourinary: Negative for dysuria, frequency, hematuria and urgency.  Musculoskeletal: Positive for back pain and myalgias. Negative for falls.  Skin: Negative for rash.  Neurological: Negative for dizziness, sensory change, loss of consciousness, weakness and headaches.  Endo/Heme/Allergies: Negative for environmental allergies. Does not bruise/bleed easily.  Psychiatric/Behavioral: Negative for depression and suicidal ideas. The patient is not nervous/anxious and does not have insomnia.        Objective:    Physical Exam  Constitutional: He is oriented to person, place, and time. He appears well-developed and well-nourished. No distress.  HENT:  Head: Normocephalic and atraumatic.  Eyes: Conjunctivae are normal.  Neck: Neck supple. No thyromegaly present.  Cardiovascular: Normal rate, regular rhythm and normal heart sounds.  No murmur heard. Pulmonary/Chest: Effort normal and breath sounds normal. No respiratory distress. He has no wheezes.  Abdominal: Soft. Bowel sounds are normal. He exhibits no mass. There is no tenderness.  Musculoskeletal: He exhibits no edema.  Lymphadenopathy:    He has no cervical adenopathy.  Neurological: He is alert and oriented to person, place, and time.  Skin: Skin is warm and dry.  Psychiatric: He has a normal mood and affect. His behavior is normal.    BP 98/62 (BP Location: Left Arm, Patient Position: Sitting, Cuff Size: Normal)   Pulse 68   Temp 97.6 F (36.4 C) (Oral)   Resp  18   Ht 5\' 10"  (1.778 m)   Wt 228 lb (103.4 kg)   SpO2 95%   BMI 32.71 kg/m  Wt Readings from Last 3 Encounters:  12/08/17 228 lb (103.4 kg)  12/01/17 225 lb (102.1 kg)  11/15/17 228 lb (103.4 kg)   BP Readings from Last 3 Encounters:  12/08/17 98/62  12/01/17 106/78  11/15/17 109/73     Immunization History  Administered Date(s) Administered  . Influenza Whole 09/15/2009, 09/23/2010  . Influenza, High Dose Seasonal PF 09/21/2016, 11/07/2017  . Influenza,inj,Quad PF,6+ Mos 09/20/2013, 10/18/2014, 09/08/2015  . Pneumococcal Conjugate-13 11/19/2013  . Pneumococcal Polysaccharide-23 04/09/2003, 10/31/2015  . Td 06/04/2010    Health Maintenance  Topic Date Due  . TETANUS/TDAP  06/04/2020  . COLONOSCOPY  12/14/2023  . INFLUENZA VACCINE  Completed  . PNA vac Low Risk Adult  Completed    Lab Results  Component Value Date   WBC 6.7 08/18/2017   HGB 15.5 08/18/2017   HCT 46.8 08/18/2017   PLT 185.0 12/07/2016   GLUCOSE 90 08/18/2017   CHOL 157 08/18/2017   TRIG 86 08/18/2017   HDL 48 08/18/2017   LDLCALC 92 08/18/2017   ALT 32 08/18/2017   AST 26 08/18/2017   NA 142 08/18/2017   K 4.9 08/18/2017   CL 104 08/18/2017   CREATININE 1.00 08/18/2017   BUN 19 08/18/2017   CO2 25 08/18/2017   TSH 1.680 08/18/2017   PSA 4.94 (H) 01/15/2010   INR 1.22 04/04/2015   HGBA1C 5.8 (H) 08/18/2017    Lab Results  Component Value Date   TSH 1.680 08/18/2017   Lab Results  Component Value Date   WBC 6.7 08/18/2017   HGB 15.5 08/18/2017   HCT 46.8 08/18/2017   MCV 96 08/18/2017   PLT 185.0 12/07/2016   Lab Results  Component Value Date   NA 142 08/18/2017   K 4.9 08/18/2017   CO2 25 08/18/2017   GLUCOSE 90 08/18/2017   BUN 19 08/18/2017   CREATININE 1.00 08/18/2017   BILITOT 0.7 08/18/2017   ALKPHOS 55 08/18/2017   AST 26 08/18/2017   ALT 32 08/18/2017   PROT 6.7 08/18/2017   ALBUMIN 4.4 08/18/2017   CALCIUM 9.1 08/18/2017   GFR 76.77 12/07/2016   Lab Results   Component Value Date   CHOL 157  08/18/2017   Lab Results  Component Value Date   HDL 48 08/18/2017   Lab Results  Component Value Date   LDLCALC 92 08/18/2017   Lab Results  Component Value Date   TRIG 86 08/18/2017   Lab Results  Component Value Date   CHOLHDL 3 04/12/2016   Lab Results  Component Value Date   HGBA1C 5.8 (H) 08/18/2017         Assessment & Plan:   Problem List Items Addressed This Visit    OSA on CPAP (Chronic)    Uses Cpap qhs      Bradycardia (Chronic)    RRR      Hypertriglyceridemia (Chronic)    Encouraged heart healthy diet, increase exercise, avoid trans fats, consider a krill oil cap daily      Post herpetic neuralgia    Mostly well controlled but with cold weather and certain fabrics can flare it up. He notes a couple of hours of rest or some constant pressure helps. Can consider prn Tizanidine if worsens      Relevant Medications   gabapentin (NEURONTIN) 300 MG capsule   Preventative health care    Patient encouraged to maintain heart healthy diet, regular exercise, adequate sleep. Consider daily probiotics. Take medications as prescribed      Obesity    Encouraged DASH diet, decrease po intake and increase exercise as tolerated. Needs 7-8 hours of sleep nightly. Avoid trans fats, eat small, frequent meals every 4-5 hours with lean proteins, complex carbs and healthy fats. Minimize simple carbs, GMO foods.      Vitamin D deficiency    Take Vitamin D 5000 IU daily      Hyperglycemia    hgba1c acceptable, minimize simple carbs. Increase exercise as tolerated. Continue current meds         I am having [name] [name]" maintain his aspirin, multivitamin with minerals, KRILL OIL PO, Vitamin D3, and gabapentin.  Meds ordered this encounter  Medications  . gabapentin (NEURONTIN) 300 MG capsule    Sig: Take 3 capsules (900 mg total) by mouth 3 (three) times daily.    Dispense:  270 capsule    Refill:  3    Please  consider 90 [name] supplies to promote better adherence    CMA served as scribe during this visit. History, Physical and Plan performed by medical provider. Documentation and orders reviewed and attested to.  [name], MD

## 2017-12-08 NOTE — Assessment & Plan Note (Addendum)
Uses Cpap qhs

## 2017-12-08 NOTE — Assessment & Plan Note (Signed)
hgba1c acceptable, minimize simple carbs. Increase exercise as tolerated. Continue current meds 

## 2017-12-08 NOTE — Assessment & Plan Note (Signed)
RRR 

## 2017-12-08 NOTE — Assessment & Plan Note (Signed)
Patient encouraged to maintain heart healthy diet, regular exercise, adequate sleep. Consider daily probiotics. Take medications as prescribed 

## 2017-12-08 NOTE — Assessment & Plan Note (Signed)
Encouraged heart healthy diet, increase exercise, avoid trans fats, consider a krill oil cap daily 

## 2017-12-08 NOTE — Assessment & Plan Note (Addendum)
Mostly well controlled but with cold weather and certain fabrics can flare it up. He notes a couple of hours of rest or some constant pressure helps. Can consider prn Tizanidine if worsens

## 2017-12-22 ENCOUNTER — Other Ambulatory Visit (INDEPENDENT_AMBULATORY_CARE_PROVIDER_SITE_OTHER): Payer: Self-pay | Admitting: Physician Assistant

## 2017-12-22 ENCOUNTER — Ambulatory Visit (INDEPENDENT_AMBULATORY_CARE_PROVIDER_SITE_OTHER): Payer: Medicare Other | Admitting: Physician Assistant

## 2017-12-22 VITALS — BP 110/65 | HR 61 | Temp 97.3°F | Ht 70.0 in | Wt 220.0 lb

## 2017-12-22 DIAGNOSIS — R7303 Prediabetes: Secondary | ICD-10-CM

## 2017-12-22 DIAGNOSIS — E669 Obesity, unspecified: Secondary | ICD-10-CM

## 2017-12-22 DIAGNOSIS — E559 Vitamin D deficiency, unspecified: Secondary | ICD-10-CM

## 2017-12-22 DIAGNOSIS — E781 Pure hyperglyceridemia: Secondary | ICD-10-CM

## 2017-12-22 DIAGNOSIS — Z6832 Body mass index (BMI) 32.0-32.9, adult: Secondary | ICD-10-CM | POA: Diagnosis not present

## 2017-12-22 NOTE — Progress Notes (Signed)
Office: [phone]  /  Fax: [phone]   HPI:   Chief Complaint: OBESITY [name] [name] is here to discuss his progress with his obesity treatment plan. He is on the keep a food journal with 1500 calories and 95 grams of protein daily and is following his eating plan approximately 75 % of the time. He states he is doing yard work for 5 to 6 hours 7 times per week. [name] [name] continues to do well with weight loss. He is very mindful of his eating and journals his meals. His weight is 220 lb (99.8 kg) today and has had a weight loss of 5 pounds over a period of 3 weeks since his last visit. He has lost 17 lbs since starting treatment with [name].  Pre-Diabetes [name] [name] has a diagnosis of pre-diabetes based on his elevated Hgb A1c and was informed this puts him at greater risk of developing diabetes. He is not taking metformin currently and continues to work on diet and exercise to decrease risk of diabetes. He denies nausea, polyphagia or hypoglycemia.  Hypertriglyceridemia [name] [name] has hypertriglyceridemia and is not on medications. His triglycerides have been elevated in the past and is now within normal limits. [name] [name] has been trying to improve his cholesterol levels with intensive lifestyle modification including a low saturated fat diet, exercise and weight loss. He denies any chest pain, claudication or myalgias.  Vitamin D deficiency [name] [name] has a diagnosis of vitamin D deficiency. He is currently on multi vitamin and vit D level is now at goal. [name] [name] denies nausea, vomiting or muscle weakness.  ALLERGIES: No Known Allergies  MEDICATIONS: Current Outpatient Medications on File Prior to Visit  Medication Sig Dispense Refill  . aspirin 81 MG chewable tablet Chew 81 mg by mouth every Monday, Wednesday, and Friday.     . Cholecalciferol (VITAMIN D3) 5000 units CAPS Take 1 capsule by mouth daily.    Marland Kitchen gabapentin (NEURONTIN) 300 MG capsule Take 3 capsules (900 mg total) by mouth 3 (three) times daily. 270  capsule 3  . KRILL OIL PO Take 1 capsule by mouth daily.    . Multiple Vitamin (MULTIVITAMIN WITH MINERALS) TABS tablet Take 1 tablet by mouth daily.     No current facility-administered medications on file prior to visit.     PAST MEDICAL HISTORY: Past Medical History:  Diagnosis Date  . BPH (benign prostatic hypertrophy)   . Bradycardia 12/30/2014   HR routinely in mid 40s-50s  . Coronary artery disease (CAD) excluded April 2016   False-positive nuclear stress test suggesting inferior ischemia  . Elevated PSA   . Encounter for Medicare annual wellness exam 09/22/2013   Sees Dr Wilhemina Bonito of Derm Sees Dr Silvano Rusk of Gastroenterology Sees Dr Kathie Rhodes of [facility] Sees Dr Susa Day of Optometry  . Finger pain, left 04/12/2016   4th   . GERD (gastroesophageal reflux disease)    controlled w/ diet and behavioral changes  . Hypertriglyceridemia 03/16/2015  . Lymphadenitis   . Melanoma of back ([place])    Excised Dr Wilhemina Bonito  . New onset a-fib ([place]) 03/10/2015  . Otitis, externa, infective 08/10/2015  . Palpitations   . Post herpetic neuralgia   . Shingles 07/09/2013  . Sleep apnea 07/30/2011   cpap- 13   . Snoring disorder 07/30/2011  . White coat hypertension     PAST SURGICAL HISTORY: Past Surgical History:  Procedure Laterality Date  . CATARACT EXTRACTION Right   . EYE SURGERY Right 12/06/2017   cataract  by Dr Gillian Scarce  . LEFT HEART CATHETERIZATION WITH CORONARY ANGIOGRAM N/A 04/10/2015   Procedure: LEFT HEART CATHETERIZATION WITH  CORONARY ANGIOGRAM;  Surgeon: Leonie Man, MD;  Location: [facility] CATH LAB;  Service: Cardiovascular;  Angiographicallly NORMAL CORONARY ARTERIES  . MASS EXCISION N/A 05/16/2014   Procedure: EXCISION POSTERIOR NECK MASS, RIGHT CHEST WALL MASS AND RIGHT AXILLARY MASS AXILLARY NODE DISSECTION;  Surgeon: Adin Hector, MD;  Location: [facility] ORS;  Service: General;  Laterality: N/A;  . NM MYOVIEW LTD  03/19/2015   FALSE POSITIVE:  INTERMEDIATE  RISK. Small sized, moderate intensity inferior ischemic perfusion defect  . TRANSTHORACIC ECHOCARDIOGRAM  03/20/2015   Normal LV size and function. EF 60-65%. G1 DD. Trivial AI. Borderline aortic root dilation (41 mm), Mild LA dilation.    SOCIAL HISTORY: Social History   Tobacco Use  . Smoking status: Former Smoker    Types: Pipe, Cigarettes    Last attempt to quit: 02/11/1989    Years since quitting: 28.8  . Smokeless tobacco: Never Used  Substance Use Topics  . Alcohol use: Yes    Alcohol/week: 4.2 oz    Types: 7 Glasses of wine per week    Comment: 1 per day -- brandy or wine  . Drug use: No    FAMILY HISTORY: Family History  Problem Relation Age of Onset  . Cancer Mother        MM, leukemia  . Obesity Mother   . Emphysema Father   . Obesity Father   . Alcohol abuse Father   . Cancer Sister        brain  . COPD Brother   . Heart disease Paternal Grandmother   . COPD Paternal Grandfather   . Diabetes Sister   . Obesity Sister   . Down syndrome Brother   . Alcohol abuse Other   . Colon cancer Neg Hx     ROS: Review of Systems  Constitutional: Positive for weight loss.  Cardiovascular: Negative for chest pain and claudication.  Gastrointestinal: Negative for nausea and vomiting.  Musculoskeletal: Negative for myalgias.       Negative muscle weakness  Endo/Heme/Allergies:       Negative polyphagia Negative hypoglycemia    PHYSICAL EXAM: Blood pressure 110/65, pulse 61, temperature (!) 97.3 F (36.3 C), temperature source Oral, height 5\' 10"  (1.778 m), weight 220 lb (99.8 kg), SpO2 98 %. Body mass index is 31.57 kg/m. Physical Exam  Constitutional: He is oriented to person, place, and time. He appears well-developed and well-nourished.  Cardiovascular: Normal rate.  Pulmonary/Chest: Effort normal.  Musculoskeletal: Normal range of motion.  Neurological: He is oriented to person, place, and time.  Skin: Skin is warm and dry.  Psychiatric: He has a  normal mood and affect. His behavior is normal.  Vitals reviewed.   RECENT LABS AND TESTS: BMET    Component Value Date/Time   NA 142 08/18/2017 1059   K 4.9 08/18/2017 1059   CL 104 08/18/2017 1059   CO2 25 08/18/2017 1059   GLUCOSE 90 08/18/2017 1059   GLUCOSE 85 12/07/2016 1531   BUN 19 08/18/2017 1059   CREATININE 1.00 08/18/2017 1059   CREATININE 0.92 04/04/2015 1113   CALCIUM 9.1 08/18/2017 1059   GFRNONAA 74 08/18/2017 1059   GFRAA 85 08/18/2017 1059   Lab Results  Component Value Date   HGBA1C 5.8 (H) 08/18/2017   Lab Results  Component Value Date   INSULIN 17.6 08/18/2017   CBC    Component  Value Date/Time   WBC 6.7 08/18/2017 1059   WBC 8.4 12/07/2016 1531   RBC 4.88 08/18/2017 1059   RBC 4.80 12/07/2016 1531   HGB 15.5 08/18/2017 1059   HCT 46.8 08/18/2017 1059   PLT 185.0 12/07/2016 1531   MCV 96 08/18/2017 1059   MCH 31.8 08/18/2017 1059   MCH 28.8 04/04/2015 1113   MCHC 33.1 08/18/2017 1059   MCHC 34.3 12/07/2016 1531   RDW 14.3 08/18/2017 1059   LYMPHSABS 1.7 08/18/2017 1059   MONOABS 0.7 09/13/2013 1350   EOSABS 0.1 08/18/2017 1059   BASOSABS 0.0 08/18/2017 1059   Iron/TIBC/Ferritin/ %Sat No results found for: IRON, TIBC, FERRITIN, IRONPCTSAT Lipid Panel     Component Value Date/Time   CHOL 157 08/18/2017 1059   TRIG 86 08/18/2017 1059   HDL 48 08/18/2017 1059   CHOLHDL 3 04/12/2016 1004   VLDL 21.0 04/12/2016 1004   LDLCALC 92 08/18/2017 1059   Hepatic Function Panel     Component Value Date/Time   PROT 6.7 08/18/2017 1059   ALBUMIN 4.4 08/18/2017 1059   AST 26 08/18/2017 1059   ALT 32 08/18/2017 1059   ALKPHOS 55 08/18/2017 1059   BILITOT 0.7 08/18/2017 1059   BILIDIR 0.1 02/11/2014 1016   IBILI 0.3 02/11/2014 1016      Component Value Date/Time   TSH 1.680 08/18/2017 1059   TSH 1.80 12/07/2016 1531   TSH 1.77 04/12/2016 1004    ASSESSMENT AND PLAN: Prediabetes - Plan: Comprehensive metabolic panel, Hemoglobin A1c,  Insulin, random  Hypertriglyceridemia - Plan: Lipid Panel With LDL/HDL Ratio  Vitamin D deficiency - Plan: VITAMIN D 25 Hydroxy (Vit-D Deficiency, Fractures)  Class 1 obesity with serious comorbidity and body mass index (BMI) of 32.0 to 32.9 in adult, unspecified obesity type  PLAN:  Pre-Diabetes [name] [name] will continue to work on weight loss, exercise, and decreasing simple carbohydrates in his diet to help decrease the risk of diabetes. We dicussed metformin including benefits and risks. He was informed that eating too many simple carbohydrates or too many calories at one sitting increases the likelihood of GI side effects. [place] declined metformin for now and a prescription was not written today. We will check labs and [name] [name] agreed to follow up with [name] as directed to monitor his progress.  Hypertriglyceridemia [name] [name] was informed of the American Heart Association Guidelines emphasizing intensive lifestyle modifications as the first line treatment for hypertriglyceridemia. We discussed many lifestyle modifications today in depth, and [name] [name] will continue to work on decreasing saturated fats such as fatty red meat, butter and many fried foods. He will also increase vegetables and lean protein in his diet and continue to work on exercise and weight loss efforts. We will check labs and [name] [name] agrees to follow up with our clinic in 3 weeks.  Vitamin D Deficiency [name] [name] was informed that low vitamin D levels contributes to fatigue and are associated with obesity, breast, and colon cancer. We will check labs and will follow up for routine testing of vitamin D, at least 2-3 times per year. He was informed of the risk of over-replacement of vitamin D and agrees to not increase his dose unless he discusses this with [name] first.  Obesity [name] [name] is currently in the action stage of change. As such, his goal is to continue with weight loss efforts He has agreed to keep a food journal with 1500 calories and 95 grams  of protein daily [name] [name] has been instructed to work up to a goal  of 150 minutes of combined cardio and strengthening exercise per week for weight loss and overall health benefits. We discussed the following Behavioral Modification Strategies today: increasing lean protein intake and work on meal planning and easy cooking plans  [name] [name] has agreed to follow up with our clinic in 3 weeks. He was informed of the importance of frequent follow up visits to maximize his success with intensive lifestyle modifications for his multiple health conditions.  I, Doreene Nest, am acting as transcriptionist for Lacy Duverney, PA-C  I have reviewed the above documentation for accuracy and completeness, and I agree with the above. -Lacy Duverney, PA-C  I have reviewed the above note and agree with the plan. -Dennard Nip, MD   OBESITY BEHAVIORAL INTERVENTION VISIT  Today's visit was # 8 out of 22.  Starting weight: 237 lbs Starting date: 08/18/17 Today's weight : 220 lbs Today's date: 12/22/2017 Total lbs lost to date: 17 (Patients must lose 7 lbs in the first 6 months to continue with counseling)   ASK: We discussed the diagnosis of obesity with [name] Lei "[name] [name]" today and [name] [name] agreed to give [name] permission to discuss obesity behavioral modification therapy today.  ASSESS: [name] [name] has the diagnosis of obesity and his BMI today is 31.57 [name] [name] is in the action stage of change   ADVISE: [name] [name] was educated on the multiple health risks of obesity as well as the benefit of weight loss to improve his health. He was advised of the need for long term treatment and the importance of lifestyle modifications.  AGREE: Multiple dietary modification options and treatment options were discussed and  [name] [name] agreed to keep a food journal with 1500 calories and 95 grams of protein daily We discussed the following Behavioral Modification Strategies today: increasing lean protein intake and work on meal planning and easy  cooking plans

## 2017-12-23 LAB — LIPID PANEL WITH LDL/HDL RATIO
CHOLESTEROL TOTAL: 145 mg/dL (ref 100–199)
HDL: 47 mg/dL (ref >39)
LDL Calculated: 84 mg/dL (ref 0–99)
LDl/HDL Ratio: 1.8 ratio (ref 0.0–3.6)
Triglycerides: 70 mg/dL (ref 0–149)
VLDL CHOLESTEROL CAL: 14 mg/dL (ref 5–40)

## 2017-12-23 LAB — COMPREHENSIVE METABOLIC PANEL
A/G RATIO: 1.5 (ref 1.2–2.2)
ALBUMIN: 4 g/dL (ref 3.5–5.5)
ALK PHOS: 55 IU/L (ref 39–117)
ALT: 18 IU/L (ref 0–44)
AST: 19 IU/L (ref 0–40)
BILIRUBIN TOTAL: 0.6 mg/dL (ref 0.0–1.2)
BUN / CREAT RATIO: 26 — AB (ref 9–20)
BUN: 22 mg/dL (ref 6–24)
CHLORIDE: 105 mmol/L (ref 96–106)
CO2: 26 mmol/L (ref 20–29)
Calcium: 9.3 mg/dL (ref 8.7–10.2)
Creatinine, Ser: 0.86 mg/dL (ref 0.76–1.27)
GFR calc non Af Amer: 96 mL/min/{1.73_m2} (ref >59)
GFR, EST AFRICAN AMERICAN: 111 mL/min/{1.73_m2} (ref >59)
GLOBULIN, TOTAL: 2.6 g/dL (ref 1.5–4.5)
GLUCOSE: 94 mg/dL (ref 65–99)
POTASSIUM: 5.1 mmol/L (ref 3.5–5.2)
SODIUM: 144 mmol/L (ref 134–144)
TOTAL PROTEIN: 6.6 g/dL (ref 6.0–8.5)

## 2017-12-23 LAB — VITAMIN D 25 HYDROXY (VIT D DEFICIENCY, FRACTURES): VIT D 25 HYDROXY: 56.8 ng/mL (ref 30.0–100.0)

## 2017-12-23 LAB — HEMOGLOBIN A1C
ESTIMATED AVERAGE GLUCOSE: 120 mg/dL
HEMOGLOBIN A1C: 5.8 % — AB (ref 4.8–5.6)

## 2017-12-23 LAB — INSULIN, RANDOM: INSULIN: 12.1 u[IU]/mL (ref 2.6–24.9)

## 2017-12-29 ENCOUNTER — Ambulatory Visit (INDEPENDENT_AMBULATORY_CARE_PROVIDER_SITE_OTHER): Payer: Medicare Other | Admitting: Dietician

## 2017-12-29 VITALS — Ht 70.0 in | Wt 221.0 lb

## 2017-12-29 DIAGNOSIS — Z6831 Body mass index (BMI) 31.0-31.9, adult: Secondary | ICD-10-CM | POA: Diagnosis not present

## 2017-12-29 DIAGNOSIS — E669 Obesity, unspecified: Secondary | ICD-10-CM

## 2017-12-29 NOTE — Progress Notes (Signed)
  Office: [phone]  /  Fax: [phone]     [name] [name] has a diagnosis of prediabetes based on his elevated HgA1c and was informed this puts him at greater risk of developing diabetes. [name] [name] is here today for diabetes risk nutrition counseling which includes his obesity treatment plan.  [name] [name]'s weight today is 221 lbs, a 1 lb weight gain since his last visit. He has had a weight loss of 16 lbs since beginning treatment with [name].  He states he has been following our category 3 meal plan about 50% of the time over the holidays. He was given a journaling meal plan at his last visit however he prefers the structure of the category 3 plan. He states his is ready to get back to 100% now that the holidays are over. Reviewed food nutrients ie protein, fats, simple and complex carbohydrates and how these affect insulin response. Focus on portion control,  avoiding simple carbohydrates and lower fat foods for ongoing wt loss efforts and glucose management  [name] [name] is on the following meal plan: category 3 His meal plan was individualized for maximum benefit.  Also discussed at length the following behavioral modifications to help maximize  Success: increasing lean protein intake, decreasing simple carbohydrates.    [name] [name] has been instructed to work up to a goal of 150 minutes of combined cardio and strengthening exercise per week for weight loss and overall health benefits.    Office: [phone]  /  Fax: [phone]  OBESITY BEHAVIORAL INTERVENTION VISIT  Today's visit was # 9 out of 22.  Starting weight: 237 lbs Starting date: 08/18/17 Today's weight : Weight: 221 lb (100.2 kg)  Today's date:12/28/17 Total lbs lost to date: 16 (Patients must lose 7 lbs in the first 6 months to continue with counseling)   ASK: We discussed the diagnosis of obesity with [name] [name] Lei "[name]" today and [name] [name] agreed to give [name] permission to discuss obesity behavioral modification therapy today.  ASSESS: [name] [name] has  the diagnosis of obesity and his BMI today is 31.71 [name] [name] is in the action stage of change   ADVISE: [name] [name] was educated on the multiple health risks of obesity as well as the benefit of weight loss to improve his health. He was advised of the need for long term treatment and the importance of lifestyle modifications.  AGREE: Multiple dietary modification options and treatment options were discussed and  [name] [name] agreed to follow the Category 3 plan We discussed the following Behavioral Modification Stratagies today: increasing lean protein intake and decreasing simple carbohydrates   I have reviewed the above documentation for accuracy and completeness, and I agree with the above. -[name] Nip, MD

## 2018-01-09 ENCOUNTER — Ambulatory Visit (INDEPENDENT_AMBULATORY_CARE_PROVIDER_SITE_OTHER): Payer: Medicare Other | Admitting: Physician Assistant

## 2018-01-09 ENCOUNTER — Encounter (INDEPENDENT_AMBULATORY_CARE_PROVIDER_SITE_OTHER): Payer: Self-pay | Admitting: Physician Assistant

## 2018-01-09 VITALS — BP 107/71 | HR 82 | Temp 97.6°F | Ht 70.0 in | Wt 218.0 lb

## 2018-01-09 DIAGNOSIS — Z6831 Body mass index (BMI) 31.0-31.9, adult: Secondary | ICD-10-CM

## 2018-01-09 DIAGNOSIS — R7303 Prediabetes: Secondary | ICD-10-CM | POA: Diagnosis not present

## 2018-01-09 DIAGNOSIS — E669 Obesity, unspecified: Secondary | ICD-10-CM | POA: Diagnosis not present

## 2018-01-09 NOTE — Progress Notes (Addendum)
Office: [phone]  /  Fax: [phone]   HPI:   Chief Complaint: OBESITY [name] [name] is here to discuss his progress with his obesity treatment plan. He is on the keep a food journal with 1500 calories and 95 grams of protein daily and is following his eating plan approximately 50 % of the time. He states he is doing aerobics 60 minutes 2 times per week. [name] [name] continues to do well with weight loss. He states hunger is well controlled, but he has an increase in cravings. His weight is 218 lb (98.9 kg) today and has had a weight loss of 3 pounds over a period of 2 weeks since his last visit. He has lost 20 lbs since starting treatment with [name].  Pre-Diabetes [name] [name] has a diagnosis of pre-diabetes based on his elevated Hgb A1c and was informed this puts him at greater risk of developing diabetes. He is not taking metformin currently and continues to work on diet and exercise to decrease risk of diabetes. He denies nausea, polyphagia or hypoglycemia.  ALLERGIES: No Known Allergies  MEDICATIONS: Current Outpatient Medications on File Prior to Visit  Medication Sig Dispense Refill  . aspirin 81 MG chewable tablet Chew 81 mg by mouth every Monday, Wednesday, and Friday.     . Cholecalciferol (VITAMIN D3) 5000 units CAPS Take 1 capsule by mouth daily.    Marland Kitchen gabapentin (NEURONTIN) 300 MG capsule Take 3 capsules (900 mg total) by mouth 3 (three) times daily. 270 capsule 3  . KRILL OIL PO Take 1 capsule by mouth daily.    . Multiple Vitamin (MULTIVITAMIN WITH MINERALS) TABS tablet Take 1 tablet by mouth daily.     No current facility-administered medications on file prior to visit.     PAST MEDICAL HISTORY: Past Medical History:  Diagnosis Date  . BPH (benign prostatic hypertrophy)   . Bradycardia 12/30/2014   HR routinely in mid 40s-50s  . Coronary artery disease (CAD) excluded April 2016   False-positive nuclear stress test suggesting inferior ischemia  . Elevated PSA   . Encounter for  Medicare annual wellness exam 09/22/2013   Sees Dr Wilhemina Bonito of Derm Sees Dr Silvano Rusk of Gastroenterology Sees Dr Kathie Rhodes of [facility] Sees Dr Susa Day of Optometry  . Finger pain, left 04/12/2016   4th   . GERD (gastroesophageal reflux disease)    controlled w/ diet and behavioral changes  . Hypertriglyceridemia 03/16/2015  . Lymphadenitis   . Melanoma of back ([place])    Excised Dr Wilhemina Bonito  . New onset a-fib ([place]) 03/10/2015  . Otitis, externa, infective 08/10/2015  . Palpitations   . Post herpetic neuralgia   . Shingles 07/09/2013  . Sleep apnea 07/30/2011   cpap- 13   . Snoring disorder 07/30/2011  . White coat hypertension     PAST SURGICAL HISTORY: Past Surgical History:  Procedure Laterality Date  . CATARACT EXTRACTION Right   . EYE SURGERY Right 12/06/2017   cataract by Dr Gillian Scarce  . LEFT HEART CATHETERIZATION WITH CORONARY ANGIOGRAM N/A 04/10/2015   Procedure: LEFT HEART CATHETERIZATION WITH  CORONARY ANGIOGRAM;  Surgeon: Leonie Man, MD;  Location: [facility] CATH LAB;  Service: Cardiovascular;  Angiographicallly NORMAL CORONARY ARTERIES  . MASS EXCISION N/A 05/16/2014   Procedure: EXCISION POSTERIOR NECK MASS, RIGHT CHEST WALL MASS AND RIGHT AXILLARY MASS AXILLARY NODE DISSECTION;  Surgeon: Adin Hector, MD;  Location: [facility] ORS;  Service: General;  Laterality: N/A;  . NM MYOVIEW LTD  03/19/2015  FALSE POSITIVE:  INTERMEDIATE RISK. Small sized, moderate intensity inferior ischemic perfusion defect  . TRANSTHORACIC ECHOCARDIOGRAM  03/20/2015   Normal LV size and function. EF 60-65%. G1 DD. Trivial AI. Borderline aortic root dilation (41 mm), Mild LA dilation.    SOCIAL HISTORY: Social History   Tobacco Use  . Smoking status: Former Smoker    Types: Pipe, Cigarettes    Last attempt to quit: 02/11/1989    Years since quitting: 28.9  . Smokeless tobacco: Never Used  Substance Use Topics  . Alcohol use: Yes    Alcohol/week: 4.2 oz    Types: 7 Glasses of  wine per week    Comment: 1 per day -- brandy or wine  . Drug use: No    FAMILY HISTORY: Family History  Problem Relation Age of Onset  . Cancer Mother        MM, leukemia  . Obesity Mother   . Emphysema Father   . Obesity Father   . Alcohol abuse Father   . Cancer Sister        brain  . COPD Brother   . Heart disease Paternal Grandmother   . COPD Paternal Grandfather   . Diabetes Sister   . Obesity Sister   . Down syndrome Brother   . Alcohol abuse Other   . Colon cancer Neg Hx     ROS: Review of Systems  Constitutional: Positive for weight loss.  Gastrointestinal: Negative for nausea.  Endo/Heme/Allergies:       Negative polyphagia Negative hypoglycemia    PHYSICAL EXAM: Blood pressure 107/71, pulse 82, temperature 97.6 F (36.4 C), temperature source Oral, height 5\' 10"  (1.778 m), weight 218 lb (98.9 kg), SpO2 98 %. Body mass index is 31.28 kg/m. Physical Exam  Constitutional: He is oriented to person, place, and time. He appears well-developed and well-nourished.  Cardiovascular: Normal rate.  Pulmonary/Chest: Effort normal.  Musculoskeletal: Normal range of motion.  Neurological: He is oriented to person, place, and time.  Skin: Skin is warm and dry.  Psychiatric: He has a normal mood and affect. His behavior is normal.  Vitals reviewed.   RECENT LABS AND TESTS: BMET    Component Value Date/Time   NA 144 12/22/2017 0831   K 5.1 12/22/2017 0831   CL 105 12/22/2017 0831   CO2 26 12/22/2017 0831   GLUCOSE 94 12/22/2017 0831   GLUCOSE 85 12/07/2016 1531   BUN 22 12/22/2017 0831   CREATININE 0.86 12/22/2017 0831   CREATININE 0.92 04/04/2015 1113   CALCIUM 9.3 12/22/2017 0831   GFRNONAA 96 12/22/2017 0831   GFRAA 111 12/22/2017 0831   Lab Results  Component Value Date   HGBA1C 5.8 (H) 12/22/2017   HGBA1C 5.8 (H) 08/18/2017   Lab Results  Component Value Date   INSULIN 12.1 12/22/2017   INSULIN 17.6 08/18/2017   CBC    Component Value  Date/Time   WBC 6.7 08/18/2017 1059   WBC 8.4 12/07/2016 1531   RBC 4.88 08/18/2017 1059   RBC 4.80 12/07/2016 1531   HGB 15.5 08/18/2017 1059   HCT 46.8 08/18/2017 1059   PLT 185.0 12/07/2016 1531   MCV 96 08/18/2017 1059   MCH 31.8 08/18/2017 1059   MCH 28.8 04/04/2015 1113   MCHC 33.1 08/18/2017 1059   MCHC 34.3 12/07/2016 1531   RDW 14.3 08/18/2017 1059   LYMPHSABS 1.7 08/18/2017 1059   MONOABS 0.7 09/13/2013 1350   EOSABS 0.1 08/18/2017 1059   BASOSABS 0.0 08/18/2017 1059  Iron/TIBC/Ferritin/ %Sat No results found for: IRON, TIBC, FERRITIN, IRONPCTSAT Lipid Panel     Component Value Date/Time   CHOL 145 12/22/2017 0831   TRIG 70 12/22/2017 0831   HDL 47 12/22/2017 0831   CHOLHDL 3 04/12/2016 1004   VLDL 21.0 04/12/2016 1004   LDLCALC 84 12/22/2017 0831   Hepatic Function Panel     Component Value Date/Time   PROT 6.6 12/22/2017 0831   ALBUMIN 4.0 12/22/2017 0831   AST 19 12/22/2017 0831   ALT 18 12/22/2017 0831   ALKPHOS 55 12/22/2017 0831   BILITOT 0.6 12/22/2017 0831   BILIDIR 0.1 02/11/2014 1016   IBILI 0.3 02/11/2014 1016      Component Value Date/Time   TSH 1.680 08/18/2017 1059   TSH 1.80 12/07/2016 1531   TSH 1.77 04/12/2016 1004    ASSESSMENT AND PLAN: Prediabetes  Class 1 obesity with serious comorbidity and body mass index (BMI) of 31.0 to 31.9 in adult, unspecified obesity type  PLAN:  Pre-Diabetes [name] [name] will continue to work on weight loss, exercise, and decreasing simple carbohydrates in his diet to help decrease the risk of diabetes. We dicussed metformin including benefits and risks. He was informed that eating too many simple carbohydrates or too many calories at one sitting increases the likelihood of GI side effects. [name] [name] declined metformin for now and a prescription was not written today. [name] [name] agreed to follow up with [name] as directed to monitor his progress.  We spent > than 50% of the 15 minute visit on the counseling as  documented in the note.  Obesity [name] [name] is currently in the action stage of change. As such, his goal is to continue with weight loss efforts He has agreed to keep a food journal with 1500 to 1800 calories and 100 grams of protein daily [name] [name] has been instructed to work up to a goal of 150 minutes of combined cardio and strengthening exercise per week for weight loss and overall health benefits. We discussed the following Behavioral Modification Strategies today: increasing lean protein intake and better snacking choices  [name] [name] has agreed to follow up with our clinic in 2 weeks. He was informed of the importance of frequent follow up visits to maximize his success with intensive lifestyle modifications for his multiple health conditions.  [name] [name], am acting as transcriptionist for Lacy Duverney, [facility] have reviewed this note and agree with its contents    OBESITY BEHAVIORAL INTERVENTION VISIT  Today's visit was # 9 out of 22.  Starting weight: 237 lbs Starting date: 08/18/17 Today's weight : 217 lbs Today's date: 01/09/2018 Total lbs lost to date: 20 (Patients must lose 7 lbs in the first 6 months to continue with counseling)   ASK: We discussed the diagnosis of obesity with [name] Lei "[name]" today and [name] [name] agreed to give [name] permission to discuss obesity behavioral modification therapy today.  ASSESS: [name] [name] has the diagnosis of obesity and his BMI today is 31.28 [name] [name] is in the action stage of change   ADVISE: [name] [name] was educated on the multiple health risks of obesity as well as the benefit of weight loss to improve his health. He was advised of the need for long term treatment and the importance of lifestyle modifications.  AGREE: Multiple dietary modification options and treatment options were discussed and  [name] [name] agreed to the above obesity treatment plan.  I have reviewed the above documentation for accuracy and completeness, and I agree with the  above. -Dennard Nip, MD

## 2018-01-10 DIAGNOSIS — H25812 Combined forms of age-related cataract, left eye: Secondary | ICD-10-CM | POA: Diagnosis not present

## 2018-01-10 DIAGNOSIS — H2512 Age-related nuclear cataract, left eye: Secondary | ICD-10-CM | POA: Diagnosis not present

## 2018-01-24 ENCOUNTER — Ambulatory Visit (INDEPENDENT_AMBULATORY_CARE_PROVIDER_SITE_OTHER): Payer: Medicare Other | Admitting: Family Medicine

## 2018-01-24 ENCOUNTER — Encounter (INDEPENDENT_AMBULATORY_CARE_PROVIDER_SITE_OTHER): Payer: Self-pay | Admitting: Family Medicine

## 2018-01-24 VITALS — BP 106/73 | HR 71 | Temp 97.6°F | Ht 70.0 in | Wt 217.0 lb

## 2018-01-24 DIAGNOSIS — E669 Obesity, unspecified: Secondary | ICD-10-CM | POA: Diagnosis not present

## 2018-01-24 DIAGNOSIS — R7303 Prediabetes: Secondary | ICD-10-CM

## 2018-01-24 DIAGNOSIS — Z6831 Body mass index (BMI) 31.0-31.9, adult: Secondary | ICD-10-CM | POA: Diagnosis not present

## 2018-01-24 NOTE — Progress Notes (Signed)
Office: [phone]  /  Fax: [phone]   HPI:   Chief Complaint: OBESITY Garen is here to discuss his progress with his obesity treatment plan. He is on the keep a food journal with 1500 to 1800 calories and 100 grams of protein daily and is following his eating plan approximately 80 % of the time. He states he is doing low impact aerobics for 60 minutes 2 times per week. Evian likes the freedom of food journaling. He may have overindulged on peanuts a few times. His calories are at goal and he has been getting 130 to 150 grams of protein per day. His weight is 217 lb (98.4 kg) today and has had a weight loss of 1 pound over a period of 2 weeks since his last visit. He has lost 20 lbs since starting treatment with [name].  Pre-Diabetes Olaoluwa has a diagnosis of pre-diabetes based on his elevated Hgb A1c and was informed this puts him at greater risk of developing diabetes. He declined metformin previously. Chancellor continues to work on diet and exercise to decrease risk of diabetes. He denies hyperphagia or hypoglycemia.  ALLERGIES: No Known Allergies  MEDICATIONS: Current Outpatient Medications on File Prior to Visit  Medication Sig Dispense Refill  . aspirin 81 MG chewable tablet Chew 81 mg by mouth every Monday, Wednesday, and Friday.     . Cholecalciferol (VITAMIN D3) 5000 units CAPS Take 1 capsule by mouth daily.    Marland Kitchen gabapentin (NEURONTIN) 300 MG capsule Take 3 capsules (900 mg total) by mouth 3 (three) times daily. 270 capsule 3  . KRILL OIL PO Take 1 capsule by mouth daily.    . Multiple Vitamin (MULTIVITAMIN WITH MINERALS) TABS tablet Take 1 tablet by mouth daily.     No current facility-administered medications on file prior to visit.     PAST MEDICAL HISTORY: Past Medical History:  Diagnosis Date  . BPH (benign prostatic hypertrophy)   . Bradycardia 12/30/2014   HR routinely in mid 40s-50s  . Coronary artery disease (CAD) excluded April 2016   False-positive nuclear stress  test suggesting inferior ischemia  . Elevated PSA   . Encounter for Medicare annual wellness exam 09/22/2013   Sees Dr Wilhemina Bonito of Derm Sees Dr Silvano Rusk of Gastroenterology Sees Dr Kathie Rhodes of [facility] Sees Dr Susa Day of Optometry  . Finger pain, left 04/12/2016   4th   . GERD (gastroesophageal reflux disease)    controlled w/ diet and behavioral changes  . Hypertriglyceridemia 03/16/2015  . Lymphadenitis   . Melanoma of back ([place])    Excised Dr Wilhemina Bonito  . New onset a-fib ([place]) 03/10/2015  . Otitis, externa, infective 08/10/2015  . Palpitations   . Post herpetic neuralgia   . Shingles 07/09/2013  . Sleep apnea 07/30/2011   cpap- 13   . Snoring disorder 07/30/2011  . White coat hypertension     PAST SURGICAL HISTORY: Past Surgical History:  Procedure Laterality Date  . CATARACT EXTRACTION Right   . EYE SURGERY Right 12/06/2017   cataract by Dr Gillian Scarce  . LEFT HEART CATHETERIZATION WITH CORONARY ANGIOGRAM N/A 04/10/2015   Procedure: LEFT HEART CATHETERIZATION WITH  CORONARY ANGIOGRAM;  Surgeon: Leonie Man, MD;  Location[facility] C[facility] LAB;  Service: Cardiovascular;  Angiographicallly NORMAL CORONARY ARTERIES  . MASS EXCISION N/A 05/16/2014   Procedure: EXCISION POSTERIOR NECK MASS, RIGHT CHEST WALL MASS AND RIGHT AXILLARY MASS AXILLARY NODE DISSECTION;  Surgeon: Adin Hector, MD;  Location: [facility]  ORS;  Service: General;  Laterality: N/A;  . NM MYOVIEW LTD  03/19/2015   FALSE POSITIVE:  INTERMEDIATE RISK. Small sized, moderate intensity inferior ischemic perfusion defect  . TRANSTHORACIC ECHOCARDIOGRAM  03/20/2015   Normal LV size and function. EF 60-65%. G1 DD. Trivial AI. Borderline aortic root dilation (41 mm), Mild LA dilation.    SOCIAL HISTORY: Social History   Tobacco Use  . Smoking status: Former Smoker    Types: Pipe, Cigarettes    Last attempt to quit: 02/11/1989    Years since quitting: 28.9  . Smokeless tobacco: Never Used  Substance Use Topics  .  Alcohol use: Yes    Alcohol/week: 4.2 oz    Types: 7 Glasses of wine per week    Comment: 1 per day -- brandy or wine  . Drug use: No    FAMILY HISTORY: Family History  Problem Relation Age of Onset  . Cancer Mother        MM, leukemia  . Obesity Mother   . Emphysema Father   . Obesity Father   . Alcohol abuse Father   . Cancer Sister        brain  . COPD Brother   . Heart disease Paternal Grandmother   . COPD Paternal Grandfather   . Diabetes Sister   . Obesity Sister   . Down syndrome Brother   . Alcohol abuse Other   . Colon cancer Neg Hx     ROS: Review of Systems  Constitutional: Positive for weight loss.  Endo/Heme/Allergies:       Negative hyperphagia Negative hypoglycemia    PHYSICAL EXAM: Blood pressure 106/73, pulse 71, temperature 97.6 F (36.4 C), temperature source Oral, height 5\' 10"  (1.778 m), weight 217 lb (98.4 kg), SpO2 98 %. Body mass index is 31.14 kg/m. Physical Exam  Constitutional: He is oriented to person, place, and time. He appears well-developed and well-nourished.  Cardiovascular: Normal rate.  Pulmonary/Chest: Effort normal.  Musculoskeletal: Normal range of motion.  Neurological: He is oriented to person, place, and time.  Skin: Skin is warm and dry.  Psychiatric: He has a normal mood and affect. His behavior is normal.  Vitals reviewed.   RECENT LABS AND TESTS: BMET    Component Value Date/Time   NA 144 12/22/2017 0831   K 5.1 12/22/2017 0831   CL 105 12/22/2017 0831   CO2 26 12/22/2017 0831   GLUCOSE 94 12/22/2017 0831   GLUCOSE 85 12/07/2016 1531   BUN 22 12/22/2017 0831   CREATININE 0.86 12/22/2017 0831   CREATININE 0.92 04/04/2015 1113   CALCIUM 9.3 12/22/2017 0831   GFRNONAA 96 12/22/2017 0831   GFRAA 111 12/22/2017 0831   Lab Results  Component Value Date   HGBA1C 5.8 (H) 12/22/2017   HGBA1C 5.8 (H) 08/18/2017   Lab Results  Component Value Date   INSULIN 12.1 12/22/2017   INSULIN 17.6 08/18/2017    CBC    Component Value Date/Time   WBC 6.7 08/18/2017 1059   WBC 8.4 12/07/2016 1531   RBC 4.88 08/18/2017 1059   RBC 4.80 12/07/2016 1531   HGB 15.5 08/18/2017 1059   HCT 46.8 08/18/2017 1059   PLT 185.0 12/07/2016 1531   MCV 96 08/18/2017 1059   MCH 31.8 08/18/2017 1059   MCH 28.8 04/04/2015 1113   MCHC 33.1 08/18/2017 1059   MCHC 34.3 12/07/2016 1531   RDW 14.3 08/18/2017 1059   LYMPHSABS 1.7 08/18/2017 1059   MONOABS 0.7 09/13/2013 1350  EOSABS 0.1 08/18/2017 1059   BASOSABS 0.0 08/18/2017 1059   Iron/TIBC/Ferritin/ %Sat No results found for: IRON, TIBC, FERRITIN, IRONPCTSAT Lipid Panel     Component Value Date/Time   CHOL 145 12/22/2017 0831   TRIG 70 12/22/2017 0831   HDL 47 12/22/2017 0831   CHOLHDL 3 04/12/2016 1004   VLDL 21.0 04/12/2016 1004   LDLCALC 84 12/22/2017 0831   Hepatic Function Panel     Component Value Date/Time   PROT 6.6 12/22/2017 0831   ALBUMIN 4.0 12/22/2017 0831   AST 19 12/22/2017 0831   ALT 18 12/22/2017 0831   ALKPHOS 55 12/22/2017 0831   BILITOT 0.6 12/22/2017 0831   BILIDIR 0.1 02/11/2014 1016   IBILI 0.3 02/11/2014 1016      Component Value Date/Time   TSH 1.680 08/18/2017 1059   TSH 1.80 12/07/2016 1531   TSH 1.77 04/12/2016 1004    ASSESSMENT AND PLAN: Prediabetes  Class 1 obesity with serious comorbidity and body mass index (BMI) of 31.0 to 31.9 in adult, unspecified obesity type  PLAN:  Pre-Diabetes Heberto will continue to work on weight loss, exercise, behavior modifications and decreasing simple carbohydrates in his diet to help decrease the risk of diabetes. He was informed that eating too many simple carbohydrates or too many calories at one sitting increases the likelihood of GI side effects.  Emmitt agreed to follow up with [name] as directed to monitor his progress. We will recheck Hgb A1c and insulin in 2 months.  Obesity Zyrell is currently in the action stage of change. As such, his goal is to continue with  weight loss efforts He has agreed to keep a food journal with 1500 to 1800 calories and 100 grams of protein daily [name] has been instructed to work up to a goal of 150 minutes of combined cardio and strengthening exercise per week or low impact aerobics, hour long class at [facility] 2 times per week for weight loss and overall health benefits. We discussed the following Behavioral Modification Strategies today: increasing lean protein intake and keep a strict food journal  Farhan has agreed to follow up with our clinic in 2 weeks. He was informed of the importance of frequent follow up visits to maximize his success with intensive lifestyle modifications for his multiple health conditions.   OBESITY BEHAVIORAL INTERVENTION VISIT  Today's visit was # 10 out of 22.  Starting weight: 237 lbs Starting date: 08/18/17 Today's weight : 217 lbs Today's date: 01/24/2018 Total lbs lost to date: 20 (Patients must lose 7 lbs in the first 6 months to continue with counseling)   ASK: We discussed the diagnosis of obesity with Lucianne Lei "[name]" today and Sylvain agreed to give [name] permission to discuss obesity behavioral modification therapy today.  ASSESS: Elkin has the diagnosis of obesity and his BMI today is 31.14 Dayquan is in the action stage of change   ADVISE: Kolston was educated on the multiple health risks of obesity as well as the benefit of weight loss to improve his health. He was advised of the need for long term treatment and the importance of lifestyle modifications.  AGREE: Multiple dietary modification options and treatment options were discussed and  Amal agreed to the above obesity treatment plan.  I, Doreene Nest, am acting as transcriptionist for Dennard Nip, MD  I have reviewed the above documentation for accuracy and completeness, and I agree with the above. -Dennard Nip, MD

## 2018-02-07 ENCOUNTER — Ambulatory Visit (INDEPENDENT_AMBULATORY_CARE_PROVIDER_SITE_OTHER): Payer: Medicare Other | Admitting: Family Medicine

## 2018-02-07 ENCOUNTER — Encounter (INDEPENDENT_AMBULATORY_CARE_PROVIDER_SITE_OTHER): Payer: Self-pay | Admitting: Family Medicine

## 2018-02-07 VITALS — BP 121/78 | HR 74 | Temp 97.5°F | Ht 70.0 in | Wt 213.0 lb

## 2018-02-07 DIAGNOSIS — G4733 Obstructive sleep apnea (adult) (pediatric): Secondary | ICD-10-CM

## 2018-02-07 DIAGNOSIS — Z6830 Body mass index (BMI) 30.0-30.9, adult: Secondary | ICD-10-CM

## 2018-02-07 DIAGNOSIS — E669 Obesity, unspecified: Secondary | ICD-10-CM

## 2018-02-07 NOTE — Progress Notes (Signed)
Office: [phone]  /  Fax: [phone]   HPI:   Chief Complaint: OBESITY [name] [name] is here to discuss his progress with his obesity treatment plan. He is on the keep a food journal with 1500-1800 calories and 100 grams of protein daily and is following his eating plan approximately 90 % of the time. He states he is doing low impact aerobics for 60 minutes 2 times per week. [name] [name] continues to do well with weight loss. His hunger is controlled and he keeps his journal daily. He is meeting all his protein goals, sleeping well, and activity and energy is good.  His weight is 213 lb (96.6 kg) today and has had a weight loss of 4 pounds over a period of 2 weeks since his last visit. He has lost 24 lbs since starting treatment with [name].  Sleep Apnea [name] [name] uses CPAP nightly. He is sleeping well and even with weight loss he doesn't feel his settings are too high. He notes fatigue has improved. His mask is still comfortable and no air leakage.  ALLERGIES: No Known Allergies  MEDICATIONS: Current Outpatient Medications on File Prior to Visit  Medication Sig Dispense Refill  . aspirin 81 MG chewable tablet Chew 81 mg by mouth every Monday, Wednesday, and Friday.     . Cholecalciferol (VITAMIN D3) 5000 units CAPS Take 1 capsule by mouth daily.    Marland Kitchen gabapentin (NEURONTIN) 300 MG capsule Take 3 capsules (900 mg total) by mouth 3 (three) times daily. 270 capsule 3  . KRILL OIL PO Take 1 capsule by mouth daily.    . Multiple Vitamin (MULTIVITAMIN WITH MINERALS) TABS tablet Take 1 tablet by mouth daily.     No current facility-administered medications on file prior to visit.     PAST MEDICAL HISTORY: Past Medical History:  Diagnosis Date  . BPH (benign prostatic hypertrophy)   . Bradycardia 12/30/2014   HR routinely in mid 40s-50s  . Coronary artery disease (CAD) excluded April 2016   False-positive nuclear stress test suggesting inferior ischemia  . Elevated PSA   . Encounter for Medicare annual  wellness exam 09/22/2013   Sees Dr Wilhemina Bonito of Derm Sees Dr Silvano Rusk of Gastroenterology Sees Dr Kathie Rhodes of [facility] Sees Dr Susa Day of Optometry  . Finger pain, left 04/12/2016   4th   . GERD (gastroesophageal reflux disease)    controlled w/ diet and behavioral changes  . Hypertriglyceridemia 03/16/2015  . Lymphadenitis   . Melanoma of back ([place])    Excised Dr Wilhemina Bonito  . New onset a-fib ([place]) 03/10/2015  . Otitis, externa, infective 08/10/2015  . Palpitations   . Post herpetic neuralgia   . Shingles 07/09/2013  . Sleep apnea 07/30/2011   cpap- 13   . Snoring disorder 07/30/2011  . White coat hypertension     PAST SURGICAL HISTORY: Past Surgical History:  Procedure Laterality Date  . CATARACT EXTRACTION Right   . EYE SURGERY Right 12/06/2017   cataract by Dr Gillian Scarce  . LEFT HEART CATHETERIZATION WITH CORONARY ANGIOGRAM N/A 04/10/2015   Procedure: LEFT HEART CATHETERIZATION WITH  CORONARY ANGIOGRAM;  Surgeon: Leonie Man, MD;  Location: [facility] CATH LAB;  Service: Cardiovascular;  Angiographicallly NORMAL CORONARY ARTERIES  . MASS EXCISION N/A 05/16/2014   Procedure: EXCISION POSTERIOR NECK MASS, RIGHT CHEST WALL MASS AND RIGHT AXILLARY MASS AXILLARY NODE DISSECTION;  Surgeon: Adin Hector, MD;  Location: [facility] ORS;  Service: General;  Laterality: N/A;  . NM MYOVIEW LTD  03/19/2015   FALSE POSITIVE:  INTERMEDIATE RISK. Small sized, moderate intensity inferior ischemic perfusion defect  . TRANSTHORACIC ECHOCARDIOGRAM  03/20/2015   Normal LV size and function. EF 60-65%. G1 DD. Trivial AI. Borderline aortic root dilation (41 mm), Mild LA dilation.    SOCIAL HISTORY: Social History   Tobacco Use  . Smoking status: Former Smoker    Types: Pipe, Cigarettes    Last attempt to quit: 02/11/1989    Years since quitting: 29.0  . Smokeless tobacco: Never Used  Substance Use Topics  . Alcohol use: Yes    Alcohol/week: 4.2 oz    Types: 7 Glasses of wine per week     Comment: 1 per day -- brandy or wine  . Drug use: No    FAMILY HISTORY: Family History  Problem Relation Age of Onset  . Cancer Mother        MM, leukemia  . Obesity Mother   . Emphysema Father   . Obesity Father   . Alcohol abuse Father   . Cancer Sister        brain  . COPD Brother   . Heart disease Paternal Grandmother   . COPD Paternal Grandfather   . Diabetes Sister   . Obesity Sister   . Down syndrome Brother   . Alcohol abuse Other   . Colon cancer Neg Hx     ROS: Review of Systems  Constitutional: Positive for weight loss. Negative for malaise/fatigue.    PHYSICAL EXAM: Blood pressure 121/78, pulse 74, temperature (!) 97.5 F (36.4 C), temperature source Oral, height 5\' 10"  (1.778 m), weight 213 lb (96.6 kg), SpO2 97 %. Body mass index is 30.56 kg/m. Physical Exam  Constitutional: He is oriented to person, place, and time. He appears well-developed and well-nourished.  Cardiovascular: Normal rate.  Pulmonary/Chest: Effort normal.  Musculoskeletal: Normal range of motion.  Neurological: He is oriented to person, place, and time.  Skin: Skin is warm and dry.  Psychiatric: He has a normal mood and affect. His behavior is normal.  Vitals reviewed.   RECENT LABS AND TESTS: BMET    Component Value Date/Time   NA 144 12/22/2017 0831   K 5.1 12/22/2017 0831   CL 105 12/22/2017 0831   CO2 26 12/22/2017 0831   GLUCOSE 94 12/22/2017 0831   GLUCOSE 85 12/07/2016 1531   BUN 22 12/22/2017 0831   CREATININE 0.86 12/22/2017 0831   CREATININE 0.92 04/04/2015 1113   CALCIUM 9.3 12/22/2017 0831   GFRNONAA 96 12/22/2017 0831   GFRAA 111 12/22/2017 0831   Lab Results  Component Value Date   HGBA1C 5.8 (H) 12/22/2017   HGBA1C 5.8 (H) 08/18/2017   Lab Results  Component Value Date   INSULIN 12.1 12/22/2017   INSULIN 17.6 08/18/2017   CBC    Component Value Date/Time   WBC 6.7 08/18/2017 1059   WBC 8.4 12/07/2016 1531   RBC 4.88 08/18/2017 1059   RBC  4.80 12/07/2016 1531   HGB 15.5 08/18/2017 1059   HCT 46.8 08/18/2017 1059   PLT 185.0 12/07/2016 1531   MCV 96 08/18/2017 1059   MCH 31.8 08/18/2017 1059   MCH 28.8 04/04/2015 1113   MCHC 33.1 08/18/2017 1059   MCHC 34.3 12/07/2016 1531   RDW 14.3 08/18/2017 1059   LYMPHSABS 1.7 08/18/2017 1059   MONOABS 0.7 09/13/2013 1350   EOSABS 0.1 08/18/2017 1059   BASOSABS 0.0 08/18/2017 1059   Iron/TIBC/Ferritin/ %Sat No results found for: IRON, TIBC, FERRITIN,  IRONPCTSAT Lipid Panel     Component Value Date/Time   CHOL 145 12/22/2017 0831   TRIG 70 12/22/2017 0831   HDL 47 12/22/2017 0831   CHOLHDL 3 04/12/2016 1004   VLDL 21.0 04/12/2016 1004   LDLCALC 84 12/22/2017 0831   Hepatic Function Panel     Component Value Date/Time   PROT 6.6 12/22/2017 0831   ALBUMIN 4.0 12/22/2017 0831   AST 19 12/22/2017 0831   ALT 18 12/22/2017 0831   ALKPHOS 55 12/22/2017 0831   BILITOT 0.6 12/22/2017 0831   BILIDIR 0.1 02/11/2014 1016   IBILI 0.3 02/11/2014 1016      Component Value Date/Time   TSH 1.680 08/18/2017 1059   TSH 1.80 12/07/2016 1531   TSH 1.77 04/12/2016 1004    ASSESSMENT AND PLAN: Obstructive sleep apnea syndrome  Class 1 obesity with serious comorbidity and body mass index (BMI) of 30.0 to 30.9 in adult, unspecified obesity type  PLAN:  Sleep Apnea [name] [name] will continue CPAP and he was educated on signs he needs to have getting adjusted. He may need a new mask soon and he will follow up with his sleep doctor. [name] [name] agrees to follow up with our clinic in 3 weeks.  We spent > than 50% of the 15 minute visit on the counseling as documented in the note.  Obesity [name] [name] is currently in the action stage of change. As such, his goal is to continue with weight loss efforts He has agreed to keep a food journal with 1500-1800 calories and 100+ grams of protein daily [name] [name] has been instructed to work up to a goal of 150 minutes of combined cardio and strengthening exercise  per week for weight loss and overall health benefits. We discussed the following Behavioral Modification Strategies today: increasing lean protein intake and decreasing simple carbohydrates    [name] [name] has agreed to follow up with our clinic in 3 weeks. He was informed of the importance of frequent follow up visits to maximize his success with intensive lifestyle modifications for his multiple health conditions.   OBESITY BEHAVIORAL INTERVENTION VISIT  Today's visit was # 11 out of 22.  Starting weight: 237 lbs Starting date: 08/18/17 Today's weight : 213 lbs Today's date: 02/07/2018 Total lbs lost to date: 24 (Patients must lose 7 lbs in the first 6 months to continue with counseling)   ASK: We discussed the diagnosis of obesity with [name] Lei "[name]" today and [name] [name] agreed to give [name] permission to discuss obesity behavioral modification therapy today.  ASSESS: [name] [name] has the diagnosis of obesity and his BMI today is 30.56 [name] [name] is in the action stage of change   ADVISE: [name] [name] was educated on the multiple health risks of obesity as well as the benefit of weight loss to improve his health. He was advised of the need for long term treatment and the importance of lifestyle modifications.  AGREE: Multiple dietary modification options and treatment options were discussed and  [name] [name] agreed to the above obesity treatment plan.  I, Trixie Dredge, am acting as transcriptionist for Dennard Nip, MD  I have reviewed the above documentation for accuracy and completeness, and I agree with the above. -Dennard Nip, MD

## 2018-02-27 ENCOUNTER — Ambulatory Visit: Payer: Medicare Other | Admitting: Physician Assistant

## 2018-02-28 ENCOUNTER — Ambulatory Visit (INDEPENDENT_AMBULATORY_CARE_PROVIDER_SITE_OTHER): Payer: Medicare Other | Admitting: Family Medicine

## 2018-02-28 VITALS — BP 125/80 | HR 70 | Temp 97.8°F | Ht 70.0 in | Wt 216.0 lb

## 2018-02-28 DIAGNOSIS — E559 Vitamin D deficiency, unspecified: Secondary | ICD-10-CM

## 2018-02-28 DIAGNOSIS — Z6831 Body mass index (BMI) 31.0-31.9, adult: Secondary | ICD-10-CM

## 2018-02-28 DIAGNOSIS — E669 Obesity, unspecified: Secondary | ICD-10-CM

## 2018-02-28 NOTE — Progress Notes (Signed)
Office: [phone]  /  Fax: [phone]   HPI:   Chief Complaint: OBESITY [name] [name] here to discuss his progress with his obesity treatment plan. He [name] on the keep [name] food journal with 1500-1800 calories and 100+ grams of protein daily and [name] following his eating plan approximately 85-90 % of the time. He states he [name] doing fitness class for 60 minutes 2 times per week. [name] [name] states he has been snacking on nuts [name] lot, easily 500-1,000 calories per day. He [name] active with yard work, daily chores, and exercise classes.  His weight [name] 216 lb (98 kg) today and has gained 3 pounds since his last visit. He has lost 21 lbs since starting treatment with [name] [name].  Vitamin D Deficiency [name] [name] has [name] diagnosis of vitamin D deficiency. He [name] on OTC Vit D 5,000 daily. He notes fatigue [name] improving and denies nausea, vomiting or muscle weakness.  ALLERGIES: No Known Allergies  MEDICATIONS: Current Outpatient Medications on File Prior to Visit  Medication Sig Dispense Refill  . aspirin 81 MG chewable tablet Chew 81 mg by mouth every Monday, Wednesday, and Friday.     . Cholecalciferol (VITAMIN D3) 5000 units CAPS Take 1 capsule by mouth daily.    Marland Kitchen gabapentin (NEURONTIN) 300 MG capsule Take 3 capsules (900 mg total) by mouth 3 (three) times daily. 270 capsule 3  . KRILL OIL PO Take 1 capsule by mouth daily.    . Multiple Vitamin (MULTIVITAMIN WITH MINERALS) TABS tablet Take 1 tablet by mouth daily.     No current facility-administered medications on file prior to visit.     PAST MEDICAL HISTORY: Past Medical History:  Diagnosis Date  . BPH (benign prostatic hypertrophy)   . Bradycardia 12/30/2014   HR routinely in mid 40s-50s  . Coronary artery disease (CAD) excluded April 2016   False-positive nuclear stress test suggesting inferior ischemia  . Elevated PSA   . Encounter for Medicare annual wellness exam 09/22/2013   Sees Dr Wilhemina Bonito of Derm Sees Dr Silvano Rusk of Gastroenterology Sees Dr Kathie Rhodes of [facility] Sees Dr Susa Day of Optometry  . Finger pain, left 04/12/2016   4th   . GERD (gastroesophageal reflux disease)    controlled w/ diet and behavioral changes  . Hypertriglyceridemia 03/16/2015  . Lymphadenitis   . Melanoma of back ([place])    Excised Dr Wilhemina Bonito  . New onset [name]-fib ([place]) 03/10/2015  . Otitis, externa, infective 08/10/2015  . Palpitations   . Post herpetic neuralgia   . Shingles 07/09/2013  . Sleep apnea 07/30/2011   cpap- 13   . Snoring disorder 07/30/2011  . White coat hypertension     PAST SURGICAL HISTORY: Past Surgical History:  Procedure Laterality Date  . CATARACT EXTRACTION Right   . EYE SURGERY Right 12/06/2017   cataract by Dr Gillian Scarce  . LEFT HEART CATHETERIZATION WITH CORONARY ANGIOGRAM N/[name] 04/10/2015   Procedure: LEFT HEART CATHETERIZATION WITH  CORONARY ANGIOGRAM;  Surgeon: Leonie Man, MD;  Location: [facility] C[facility] LAB;  Service: Cardiovascular;  Angiographicallly NORMAL CORONARY ARTERIES  . MASS EXCISION N/[name] 05/16/2014   Procedure: EXCISION POSTERIOR NECK MASS, RIGHT CHEST WALL MASS AND RIGHT AXILLARY MASS AXILLARY NODE DISSECTION;  Surgeon: Adin Hector, MD;  Location: [facility] ORS;  Service: General;  Laterality: N/[name];  . NM MYOVIEW LTD  03/19/2015   FALSE POSITIVE:  INTERMEDIATE RISK. Small sized, moderate intensity inferior ischemic perfusion defect  . TRANSTHORACIC ECHOCARDIOGRAM  03/20/2015  Normal LV size and function. EF 60-65%. G1 DD. Trivial AI. Borderline aortic root dilation (41 mm), Mild LA dilation.    SOCIAL HISTORY: Social History   Tobacco Use  . Smoking status: Former Smoker    Types: Pipe, Cigarettes    Last attempt to quit: 02/11/1989    Years since quitting: 29.0  . Smokeless tobacco: Never Used  Substance Use Topics  . Alcohol use: Yes    Alcohol/week: 4.2 oz    Types: 7 Glasses of wine per week    Comment: 1 per day -- brandy or wine  . Drug use: No    FAMILY HISTORY: Family History  Problem  Relation Age of Onset  . Cancer Mother        MM, leukemia  . Obesity Mother   . Emphysema Father   . Obesity Father   . Alcohol abuse Father   . Cancer Sister        brain  . COPD Brother   . Heart disease Paternal Grandmother   . COPD Paternal Grandfather   . Diabetes Sister   . Obesity Sister   . Down syndrome Brother   . Alcohol abuse Other   . Colon cancer Neg Hx     ROS: Review of Systems  Constitutional: Positive for malaise/fatigue. Negative for weight loss.  Gastrointestinal: Negative for nausea and vomiting.  Musculoskeletal:       Negative muscle weakness    PHYSICAL EXAM: Blood pressure 125/80, pulse 70, temperature 97.8 F (36.6 C), temperature source Oral, height 5\' 10"  (1.778 m), weight 216 lb (98 kg), SpO2 98 %. Body mass index [name] 30.99 kg/m. Physical Exam  Constitutional: He [name] oriented to person, place, and time. He appears well-developed and well-nourished.  Cardiovascular: Normal rate.  Pulmonary/Chest: Effort normal.  Musculoskeletal: Normal range of motion.  Neurological: He [name] oriented to person, place, and time.  Skin: Skin [name] warm and dry.  Psychiatric: He has [name] normal mood and affect. His behavior [name] normal.  Vitals reviewed.   RECENT LABS AND TESTS: BMET    Component Value Date/Time   NA 144 12/22/2017 0831   K 5.1 12/22/2017 0831   CL 105 12/22/2017 0831   CO2 26 12/22/2017 0831   GLUCOSE 94 12/22/2017 0831   GLUCOSE 85 12/07/2016 1531   BUN 22 12/22/2017 0831   CREATININE 0.86 12/22/2017 0831   CREATININE 0.92 04/04/2015 1113   CALCIUM 9.3 12/22/2017 0831   GFRNONAA 96 12/22/2017 0831   GFRAA 111 12/22/2017 0831   Lab Results  Component Value Date   HGBA1C 5.8 (H) 12/22/2017   HGBA1C 5.8 (H) 08/18/2017   Lab Results  Component Value Date   INSULIN 12.1 12/22/2017   INSULIN 17.6 08/18/2017   CBC    Component Value Date/Time   WBC 6.7 08/18/2017 1059   WBC 8.4 12/07/2016 1531   RBC 4.88 08/18/2017 1059   RBC 4.80  12/07/2016 1531   HGB 15.5 08/18/2017 1059   HCT 46.8 08/18/2017 1059   PLT 185.0 12/07/2016 1531   MCV 96 08/18/2017 1059   MCH 31.8 08/18/2017 1059   MCH 28.8 04/04/2015 1113   MCHC 33.1 08/18/2017 1059   MCHC 34.3 12/07/2016 1531   RDW 14.3 08/18/2017 1059   LYMPHSABS 1.7 08/18/2017 1059   MONOABS 0.7 09/13/2013 1350   EOSABS 0.1 08/18/2017 1059   BASOSABS 0.0 08/18/2017 1059   Iron/TIBC/Ferritin/ %Sat No results found for: IRON, TIBC, FERRITIN, IRONPCTSAT Lipid Panel  Component Value Date/Time   CHOL 145 12/22/2017 0831   TRIG 70 12/22/2017 0831   HDL 47 12/22/2017 0831   CHOLHDL 3 04/12/2016 1004   VLDL 21.0 04/12/2016 1004   LDLCALC 84 12/22/2017 0831   Hepatic Function Panel     Component Value Date/Time   PROT 6.6 12/22/2017 0831   ALBUMIN 4.0 12/22/2017 0831   AST 19 12/22/2017 0831   ALT 18 12/22/2017 0831   ALKPHOS 55 12/22/2017 0831   BILITOT 0.6 12/22/2017 0831   BILIDIR 0.1 02/11/2014 1016   IBILI 0.3 02/11/2014 1016      Component Value Date/Time   TSH 1.680 08/18/2017 1059   TSH 1.80 12/07/2016 1531   TSH 1.77 04/12/2016 1004  Results for [name] "MIKE", [name] [name] (MRN [id number]) as of 02/28/2018 08:12  Ref. Range 12/22/2017 08:31  Vitamin D, 25-Hydroxy Latest Ref Range: 30.0 - 100.0 ng/mL 56.8    ASSESSMENT AND PLAN: Vitamin D deficiency  Class 1 obesity with serious comorbidity and body mass index (BMI) of 31.0 to 31.9 in adult, unspecified obesity type  PLAN:  Vitamin D Deficiency [name] [name] was informed that low vitamin D levels contributes to fatigue and are associated with obesity, breast, and colon cancer. [name] [name] agrees to continue taking OTC Vit D and will follow up for routine testing of vitamin D, at least 2-3 times per year. He was informed of the risk of over-replacement of vitamin D and agrees to not increase his dose unless he discusses this with [name] [name] first. We will recheck labs in 1 month and [name] [name] agrees to follow up with our clinic in 2  weeks.  We spent > than 50% of the 15 minute visit on the counseling as documented in the note.  Obesity [name] [name] [name] currently in the action stage of change. As such, his goal [name] to continue with weight loss efforts He has agreed to keep [name] food journal with 1500-1800 calories and 100+ grams of protein daily [name] [name] has been instructed to work up to [name] goal of 150 minutes of combined cardio and strengthening exercise per week for weight loss and overall health benefits. We discussed the following Behavioral Modification Strategies today: increasing lean protein intake and decreasing simple carbohydrates  [name] [name] was advised to weigh out nuts to portion control instead of mindless eating.  [name] [name] has agreed to follow up with our clinic in 2 weeks. He was informed of the importance of frequent follow up visits to maximize his success with intensive lifestyle modifications for his multiple health conditions.   OBESITY BEHAVIORAL INTERVENTION VISIT  Today's visit was # 12 out of 22.  Starting weight: 237 lbs Starting date: 08/18/17 Today's weight : 216 lbs  Today's date: 02/28/2018 Total lbs lost to date: 21 (Patients must lose 7 lbs in the first 6 months to continue with counseling)   ASK: We discussed the diagnosis of obesity with [name] Lei "[name]" today and [name] [name] agreed to give [name] [name] permission to discuss obesity behavioral modification therapy today.  ASSESS: [name] [name] has the diagnosis of obesity and his BMI today [name] 30.99 [place] [name] in the action stage of change   ADVISE: [name] [name] was educated on the multiple health risks of obesity as well as the benefit of weight loss to improve his health. He was advised of the need for long term treatment and the importance of lifestyle modifications.  AGREE: Multiple dietary modification options and treatment options were discussed and  [name] [name] agreed to the above obesity treatment plan.  [name] [name], Trixie Dredge, am acting as transcriptionist for Dennard Nip,  MD  [name] [name] have reviewed the above documentation for accuracy and completeness, and [name] [name] agree with the above. -Dennard Nip, MD

## 2018-03-02 NOTE — Progress Notes (Signed)
Office: [phone]  /  Fax: [phone]   HPI:   Chief Complaint: OBESITY [name] [name] is here to discuss his progress with his obesity treatment plan. He is on the keep a food journal with 1500-1800 calories and 100+ grams of protein daily and is following his eating plan approximately 85-90 % of the time. He states he is doing fitness class for 60 minutes 2 times per week. [name] [name] states he has been snacking on nuts a lot, easily 500-1,000 calories per day. He is active with yard work, daily chores, and exercise classes.  His weight is 216 lb (98 kg) today and has gained 3 pounds since his last visit. He has lost 21 lbs since starting treatment with [name].  Vitamin D Deficiency [name] [name] has a diagnosis of vitamin D deficiency. He is on OTC Vit D 5,000 daily. He notes fatigue is improving and denies nausea, vomiting or muscle weakness.  ALLERGIES: No Known Allergies  MEDICATIONS: Current Outpatient Medications on File Prior to Visit  Medication Sig Dispense Refill  . aspirin 81 MG chewable tablet Chew 81 mg by mouth every Monday, Wednesday, and Friday.     . Cholecalciferol (VITAMIN D3) 5000 units CAPS Take 1 capsule by mouth daily.    Marland Kitchen gabapentin (NEURONTIN) 300 MG capsule Take 3 capsules (900 mg total) by mouth 3 (three) times daily. 270 capsule 3  . KRILL OIL PO Take 1 capsule by mouth daily.    . Multiple Vitamin (MULTIVITAMIN WITH MINERALS) TABS tablet Take 1 tablet by mouth daily.     No current facility-administered medications on file prior to visit.     PAST MEDICAL HISTORY: Past Medical History:  Diagnosis Date  . BPH (benign prostatic hypertrophy)   . Bradycardia 12/30/2014   HR routinely in mid 40s-50s  . Coronary artery disease (CAD) excluded April 2016   False-positive nuclear stress test suggesting inferior ischemia  . Elevated PSA   . Encounter for Medicare annual wellness exam 09/22/2013   Sees Dr Wilhemina Bonito of Derm Sees Dr Silvano Rusk of Gastroenterology Sees Dr Kathie Rhodes of [facility] Sees Dr Susa Day of Optometry  . Finger pain, left 04/12/2016   4th   . GERD (gastroesophageal reflux disease)    controlled w/ diet and behavioral changes  . Hypertriglyceridemia 03/16/2015  . Lymphadenitis   . Melanoma of back ([place])    Excised Dr Wilhemina Bonito  . New onset a-fib ([place]) 03/10/2015  . Otitis, externa, infective 08/10/2015  . Palpitations   . Post herpetic neuralgia   . Shingles 07/09/2013  . Sleep apnea 07/30/2011   cpap- 13   . Snoring disorder 07/30/2011  . White coat hypertension     PAST SURGICAL HISTORY: Past Surgical History:  Procedure Laterality Date  . CATARACT EXTRACTION Right   . EYE SURGERY Right 12/06/2017   cataract by Dr Gillian Scarce  . LEFT HEART CATHETERIZATION WITH CORONARY ANGIOGRAM N/A 04/10/2015   Procedure: LEFT HEART CATHETERIZATION WITH  CORONARY ANGIOGRAM;  Surgeon: Leonie Man, MD;  Location: [facility] CATH LAB;  Service: Cardiovascular;  Angiographicallly NORMAL CORONARY ARTERIES  . MASS EXCISION N/A 05/16/2014   Procedure: EXCISION POSTERIOR NECK MASS, RIGHT CHEST WALL MASS AND RIGHT AXILLARY MASS AXILLARY NODE DISSECTION;  Surgeon: Adin Hector, MD;  Location: [facility] ORS;  Service: General;  Laterality: N/A;  . NM MYOVIEW LTD  03/19/2015   FALSE POSITIVE:  INTERMEDIATE RISK. Small sized, moderate intensity inferior ischemic perfusion defect  . TRANSTHORACIC ECHOCARDIOGRAM  03/20/2015  Normal LV size and function. EF 60-65%. G1 DD. Trivial AI. Borderline aortic root dilation (41 mm), Mild LA dilation.    SOCIAL HISTORY: Social History   Tobacco Use  . Smoking status: Former Smoker    Types: Pipe, Cigarettes    Last attempt to quit: 02/11/1989    Years since quitting: 29.0  . Smokeless tobacco: Never Used  Substance Use Topics  . Alcohol use: Yes    Alcohol/week: 4.2 oz    Types: 7 Glasses of wine per week    Comment: 1 per day -- brandy or wine  . Drug use: No    FAMILY HISTORY: Family History  Problem  Relation Age of Onset  . Cancer Mother        MM, leukemia  . Obesity Mother   . Emphysema Father   . Obesity Father   . Alcohol abuse Father   . Cancer Sister        brain  . COPD Brother   . Heart disease Paternal Grandmother   . COPD Paternal Grandfather   . Diabetes Sister   . Obesity Sister   . Down syndrome Brother   . Alcohol abuse Other   . Colon cancer Neg Hx     ROS: Review of Systems  Constitutional: Positive for malaise/fatigue. Negative for weight loss.  Gastrointestinal: Negative for nausea and vomiting.  Musculoskeletal:       Negative muscle weakness    PHYSICAL EXAM: Blood pressure 125/80, pulse 70, temperature 97.8 F (36.6 C), temperature source Oral, height 5\' 10"  (1.778 m), weight 216 lb (98 kg), SpO2 98 %. Body mass index is 30.99 kg/m. Physical Exam  Constitutional: He is oriented to person, place, and time. He appears well-developed and well-nourished.  Cardiovascular: Normal rate.  Pulmonary/Chest: Effort normal.  Musculoskeletal: Normal range of motion.  Neurological: He is oriented to person, place, and time.  Skin: Skin is warm and dry.  Psychiatric: He has a normal mood and affect. His behavior is normal.  Vitals reviewed.   RECENT LABS AND TESTS: BMET    Component Value Date/Time   NA 144 12/22/2017 0831   K 5.1 12/22/2017 0831   CL 105 12/22/2017 0831   CO2 26 12/22/2017 0831   GLUCOSE 94 12/22/2017 0831   GLUCOSE 85 12/07/2016 1531   BUN 22 12/22/2017 0831   CREATININE 0.86 12/22/2017 0831   CREATININE 0.92 04/04/2015 1113   CALCIUM 9.3 12/22/2017 0831   GFRNONAA 96 12/22/2017 0831   GFRAA 111 12/22/2017 0831   Lab Results  Component Value Date   HGBA1C 5.8 (H) 12/22/2017   HGBA1C 5.8 (H) 08/18/2017   Lab Results  Component Value Date   INSULIN 12.1 12/22/2017   INSULIN 17.6 08/18/2017   CBC    Component Value Date/Time   WBC 6.7 08/18/2017 1059   WBC 8.4 12/07/2016 1531   RBC 4.88 08/18/2017 1059   RBC 4.80  12/07/2016 1531   HGB 15.5 08/18/2017 1059   HCT 46.8 08/18/2017 1059   PLT 185.0 12/07/2016 1531   MCV 96 08/18/2017 1059   MCH 31.8 08/18/2017 1059   MCH 28.8 04/04/2015 1113   MCHC 33.1 08/18/2017 1059   MCHC 34.3 12/07/2016 1531   RDW 14.3 08/18/2017 1059   LYMPHSABS 1.7 08/18/2017 1059   MONOABS 0.7 09/13/2013 1350   EOSABS 0.1 08/18/2017 1059   BASOSABS 0.0 08/18/2017 1059   Iron/TIBC/Ferritin/ %Sat No results found for: IRON, TIBC, FERRITIN, IRONPCTSAT Lipid Panel  Component Value Date/Time   CHOL 145 12/22/2017 0831   TRIG 70 12/22/2017 0831   HDL 47 12/22/2017 0831   CHOLHDL 3 04/12/2016 1004   VLDL 21.0 04/12/2016 1004   LDLCALC 84 12/22/2017 0831   Hepatic Function Panel     Component Value Date/Time   PROT 6.6 12/22/2017 0831   ALBUMIN 4.0 12/22/2017 0831   AST 19 12/22/2017 0831   ALT 18 12/22/2017 0831   ALKPHOS 55 12/22/2017 0831   BILITOT 0.6 12/22/2017 0831   BILIDIR 0.1 02/11/2014 1016   IBILI 0.3 02/11/2014 1016      Component Value Date/Time   TSH 1.680 08/18/2017 1059   TSH 1.80 12/07/2016 1531   TSH 1.77 04/12/2016 1004  Results for Holdman "MIKE", [name] A (MRN [id number]) as of 02/28/2018 08:12  Ref. Range 12/22/2017 08:31  Vitamin D, 25-Hydroxy Latest Ref Range: 30.0 - 100.0 ng/mL 56.8    ASSESSMENT AND PLAN: Vitamin D deficiency  Class 1 obesity with serious comorbidity and body mass index (BMI) of 31.0 to 31.9 in adult, unspecified obesity type  PLAN:  Vitamin D Deficiency [name] [name] was informed that low vitamin D levels contributes to fatigue and are associated with obesity, breast, and colon cancer. [name] [name] agrees to continue taking OTC Vit D and will follow up for routine testing of vitamin D, at least 2-3 times per year. He was informed of the risk of over-replacement of vitamin D and agrees to not increase his dose unless he discusses this with [name] first. We will recheck labs in 1 month and [name] [name] agrees to follow up with our clinic in 2  weeks.  We spent > than 50% of the 15 minute visit on the counseling as documented in the note.  Obesity [name] [name] is currently in the action stage of change. As such, his goal is to continue with weight loss efforts He has agreed to keep a food journal with 1500-1800 calories and 100+ grams of protein daily [name] [name] has been instructed to work up to a goal of 150 minutes of combined cardio and strengthening exercise per week for weight loss and overall health benefits. We discussed the following Behavioral Modification Strategies today: increasing lean protein intake and decreasing simple carbohydrates  [name] [name] was advised to weigh out nuts to portion control instead of mindless eating.  [name] [name] has agreed to follow up with our clinic in 2 weeks. He was informed of the importance of frequent follow up visits to maximize his success with intensive lifestyle modifications for his multiple health conditions.   OBESITY BEHAVIORAL INTERVENTION VISIT  Today's visit was # 12 out of 22.  Starting weight: 237 lbs Starting date: 08/18/17 Today's weight : 216 lbs  Today's date: 03/02/2018 Total lbs lost to date: 21 (Patients must lose 7 lbs in the first 6 months to continue with counseling)   ASK: We discussed the diagnosis of obesity with [name] [name] Lei "[name] [name]" today and [name] [name] agreed to give [name] permission to discuss obesity behavioral modification therapy today.  ASSESS: [name] [name] has the diagnosis of obesity and his BMI today is 30.99 [name] [name] is in the action stage of change   ADVISE: [name] [name] was educated on the multiple health risks of obesity as well as the benefit of weight loss to improve his health. He was advised of the need for long term treatment and the importance of lifestyle modifications.  AGREE: Multiple dietary modification options and treatment options were discussed and  [name] [name] agreed to the above obesity treatment plan.  I, Trixie Dredge, am acting as transcriptionist for Dennard Nip,  MD  I have reviewed the above documentation for accuracy and completeness, and I agree with the above. -Dennard Nip, MD

## 2018-03-14 ENCOUNTER — Encounter (INDEPENDENT_AMBULATORY_CARE_PROVIDER_SITE_OTHER): Payer: Self-pay | Admitting: Family Medicine

## 2018-03-14 ENCOUNTER — Ambulatory Visit (INDEPENDENT_AMBULATORY_CARE_PROVIDER_SITE_OTHER): Payer: Medicare Other | Admitting: Physician Assistant

## 2018-03-14 ENCOUNTER — Ambulatory Visit (INDEPENDENT_AMBULATORY_CARE_PROVIDER_SITE_OTHER): Payer: Medicare Other | Admitting: Family Medicine

## 2018-03-14 ENCOUNTER — Encounter: Payer: Self-pay | Admitting: Physician Assistant

## 2018-03-14 VITALS — BP 116/76 | HR 64 | Temp 97.9°F | Ht 70.0 in | Wt 211.0 lb

## 2018-03-14 VITALS — BP 114/78 | HR 84 | Ht 70.0 in | Wt 218.0 lb

## 2018-03-14 DIAGNOSIS — E559 Vitamin D deficiency, unspecified: Secondary | ICD-10-CM

## 2018-03-14 DIAGNOSIS — Z6830 Body mass index (BMI) 30.0-30.9, adult: Secondary | ICD-10-CM | POA: Diagnosis not present

## 2018-03-14 DIAGNOSIS — E669 Obesity, unspecified: Secondary | ICD-10-CM | POA: Diagnosis not present

## 2018-03-14 DIAGNOSIS — Z7901 Long term (current) use of anticoagulants: Secondary | ICD-10-CM

## 2018-03-14 DIAGNOSIS — I481 Persistent atrial fibrillation: Secondary | ICD-10-CM | POA: Diagnosis not present

## 2018-03-14 DIAGNOSIS — I4819 Other persistent atrial fibrillation: Secondary | ICD-10-CM

## 2018-03-14 MED ORDER — APIXABAN 5 MG PO TABS: 5.0000 mg | ORAL_TABLET | Freq: Two times a day (BID) | ORAL | 3 refills | Status: DC

## 2018-03-14 MED ORDER — APIXABAN 5 MG PO TABS: 5.0000 mg | ORAL_TABLET | Freq: Two times a day (BID) | ORAL | 0 refills | Status: DC

## 2018-03-14 NOTE — Progress Notes (Signed)
Office: [phone]  /  Fax: [phone]   HPI:   Chief Complaint: OBESITY [name] [name] is here to discuss his progress with his obesity treatment plan. He is on the  keep a food journal with 1500 to 1800 calories and 100+ grams of protein  and is following his eating plan approximately 85 % of the time. He states he is walking and exercising at the gym for 60 minutes 2 times per week. [name] [name] is doing well with journaling and meeting his protein goal. Hunger is controlled and he feels his energy is good. His weight is 211 lb (95.7 kg) today and has had a weight loss of 5 pounds over a period of 2 weeks since his last visit. He has lost 26 lbs since starting treatment with [name].  Vitamin D deficiency [name] [name] has a diagnosis of vitamin D deficiency. He is  Stable on OTC vit D and denies nausea, vomiting, muscle cramping or muscle weakness. Fatigue is greatly improved.  ALLERGIES: No Known Allergies  MEDICATIONS: Current Outpatient Medications on File Prior to Visit  Medication Sig Dispense Refill  . apixaban (ELIQUIS) 5 MG TABS tablet Take 1 tablet (5 mg total) by mouth 2 (two) times daily. 180 tablet 3  . Cholecalciferol (VITAMIN D3) 5000 units CAPS Take 1 capsule by mouth daily.    Marland Kitchen gabapentin (NEURONTIN) 300 MG capsule Take 3 capsules (900 mg total) by mouth 3 (three) times daily. 270 capsule 3  . KRILL OIL PO Take 1 capsule by mouth daily.    . Multiple Vitamin (MULTIVITAMIN WITH MINERALS) TABS tablet Take 1 tablet by mouth daily.     No current facility-administered medications on file prior to visit.     PAST MEDICAL HISTORY: Past Medical History:  Diagnosis Date  . BPH (benign prostatic hypertrophy)   . Bradycardia 12/30/2014   HR routinely in mid 40s-50s  . Coronary artery disease (CAD) excluded April 2016   False-positive nuclear stress test suggesting inferior ischemia  . Elevated PSA   . Encounter for Medicare annual wellness exam 09/22/2013   Sees Dr Wilhemina Bonito of Derm Sees Dr  Silvano Rusk of Gastroenterology Sees Dr Kathie Rhodes of [facility] Sees Dr Susa Day of Optometry  . Finger pain, left 04/12/2016   4th   . GERD (gastroesophageal reflux disease)    controlled w/ diet and behavioral changes  . Hypertriglyceridemia 03/16/2015  . Lymphadenitis   . Melanoma of back ([place])    Excised Dr Wilhemina Bonito  . New onset a-fib ([place]) 03/10/2015  . Otitis, externa, infective 08/10/2015  . Palpitations   . Post herpetic neuralgia   . Shingles 07/09/2013  . Sleep apnea 07/30/2011   cpap- 13   . Snoring disorder 07/30/2011  . White coat hypertension     PAST SURGICAL HISTORY: Past Surgical History:  Procedure Laterality Date  . CATARACT EXTRACTION Right   . EYE SURGERY Right 12/06/2017   cataract by Dr Gillian Scarce  . LEFT HEART CATHETERIZATION WITH CORONARY ANGIOGRAM N/A 04/10/2015   Procedure: LEFT HEART CATHETERIZATION WITH  CORONARY ANGIOGRAM;  Surgeon: Leonie Man, MD;  Location: [facility] CATH LAB;  Service: Cardiovascular;  Angiographicallly NORMAL CORONARY ARTERIES  . MASS EXCISION N/A 05/16/2014   Procedure: EXCISION POSTERIOR NECK MASS, RIGHT CHEST WALL MASS AND RIGHT AXILLARY MASS AXILLARY NODE DISSECTION;  Surgeon: Adin Hector, MD;  Location: [facility] ORS;  Service: General;  Laterality: N/A;  . NM MYOVIEW LTD  03/19/2015   FALSE POSITIVE:  INTERMEDIATE RISK. Small  sized, moderate intensity inferior ischemic perfusion defect  . TRANSTHORACIC ECHOCARDIOGRAM  03/20/2015   Normal LV size and function. EF 60-65%. G1 DD. Trivial AI. Borderline aortic root dilation (41 mm), Mild LA dilation.    SOCIAL HISTORY: Social History   Tobacco Use  . Smoking status: Former Smoker    Types: Pipe, Cigarettes    Last attempt to quit: 02/11/1989    Years since quitting: 29.1  . Smokeless tobacco: Never Used  Substance Use Topics  . Alcohol use: Yes    Alcohol/week: 4.2 oz    Types: 7 Glasses of wine per week    Comment: 1 per day -- brandy or wine  . Drug use: No     FAMILY HISTORY: Family History  Problem Relation Age of Onset  . Cancer Mother        MM, leukemia  . Obesity Mother   . Emphysema Father   . Obesity Father   . Alcohol abuse Father   . Cancer Sister        brain  . COPD Brother   . Heart disease Paternal Grandmother   . COPD Paternal Grandfather   . Diabetes Sister   . Obesity Sister   . Down syndrome Brother   . Alcohol abuse Other   . Colon cancer Neg Hx     ROS: Review of Systems  Constitutional: Positive for malaise/fatigue and weight loss.  Gastrointestinal: Negative for nausea and vomiting.  Musculoskeletal:       Negative for muscle weakness Negative for muscle cramping    PHYSICAL EXAM: Blood pressure 116/76, pulse 64, temperature 97.9 F (36.6 C), temperature source Oral, height 5\' 10"  (1.778 m), weight 211 lb (95.7 kg), SpO2 93 %. Body mass index is 30.28 kg/m. Physical Exam  Constitutional: He is oriented to person, place, and time. He appears well-developed and well-nourished.  Cardiovascular: Normal rate.  Pulmonary/Chest: Effort normal.  Musculoskeletal: Normal range of motion.  Neurological: He is oriented to person, place, and time.  Skin: Skin is warm and dry.  Psychiatric: He has a normal mood and affect. His behavior is normal.  Vitals reviewed.   RECENT LABS AND TESTS: BMET    Component Value Date/Time   NA 144 12/22/2017 0831   K 5.1 12/22/2017 0831   CL 105 12/22/2017 0831   CO2 26 12/22/2017 0831   GLUCOSE 94 12/22/2017 0831   GLUCOSE 85 12/07/2016 1531   BUN 22 12/22/2017 0831   CREATININE 0.86 12/22/2017 0831   CREATININE 0.92 04/04/2015 1113   CALCIUM 9.3 12/22/2017 0831   GFRNONAA 96 12/22/2017 0831   GFRAA 111 12/22/2017 0831   Lab Results  Component Value Date   HGBA1C 5.8 (H) 12/22/2017   HGBA1C 5.8 (H) 08/18/2017   Lab Results  Component Value Date   INSULIN 12.1 12/22/2017   INSULIN 17.6 08/18/2017   CBC    Component Value Date/Time   WBC 6.7 08/18/2017  1059   WBC 8.4 12/07/2016 1531   RBC 4.88 08/18/2017 1059   RBC 4.80 12/07/2016 1531   HGB 15.5 08/18/2017 1059   HCT 46.8 08/18/2017 1059   PLT 185.0 12/07/2016 1531   MCV 96 08/18/2017 1059   MCH 31.8 08/18/2017 1059   MCH 28.8 04/04/2015 1113   MCHC 33.1 08/18/2017 1059   MCHC 34.3 12/07/2016 1531   RDW 14.3 08/18/2017 1059   LYMPHSABS 1.7 08/18/2017 1059   MONOABS 0.7 09/13/2013 1350   EOSABS 0.1 08/18/2017 1059   BASOSABS 0.0 08/18/2017  1059   Iron/TIBC/Ferritin/ %Sat No results found for: IRON, TIBC, FERRITIN, IRONPCTSAT Lipid Panel     Component Value Date/Time   CHOL 145 12/22/2017 0831   TRIG 70 12/22/2017 0831   HDL 47 12/22/2017 0831   CHOLHDL 3 04/12/2016 1004   VLDL 21.0 04/12/2016 1004   LDLCALC 84 12/22/2017 0831   Hepatic Function Panel     Component Value Date/Time   PROT 6.6 12/22/2017 0831   ALBUMIN 4.0 12/22/2017 0831   AST 19 12/22/2017 0831   ALT 18 12/22/2017 0831   ALKPHOS 55 12/22/2017 0831   BILITOT 0.6 12/22/2017 0831   BILIDIR 0.1 02/11/2014 1016   IBILI 0.3 02/11/2014 1016      Component Value Date/Time   TSH 1.680 08/18/2017 1059   TSH 1.80 12/07/2016 1531   TSH 1.77 04/12/2016 1004   Results for Chipman "MIKE", Emmauel A (MRN [id number]) as of 03/14/2018 15:45  Ref. Range 12/22/2017 08:31  Vitamin D, 25-Hydroxy Latest Ref Range: 30.0 - 100.0 ng/mL 56.8   ASSESSMENT AND PLAN: Vitamin D deficiency  Class 1 obesity with serious comorbidity and body mass index (BMI) of 30.0 to 30.9 in adult, unspecified obesity type  PLAN:  Vitamin D Deficiency [name] [name] was informed that low vitamin D levels contributes to fatigue and are associated with obesity, breast, and colon cancer. He agrees to continue to take OTC Vit D @5 ,000 IU daily. We will recheck labs in 6 weeks and he will follow up for routine testing of vitamin D, at least 2-3 times per year. He was informed of the risk of over-replacement of vitamin D and agrees to not increase his dose  unless he discusses this with [name] first.  Obesity [name] [name] is currently in the action stage of change. As such, his goal is to continue with weight loss efforts He has agreed to keep a food journal with 1500 to 1800 calories and 100 grams of protein daily [name] [name] has been instructed to work up to a goal of 150 minutes of combined cardio and strengthening exercise per week for weight loss and overall health benefits. We discussed the following Behavioral Modification Strategies today: decrease eating out and work on meal planning and easy cooking plans  [name] [name] has agreed to follow up with our clinic in 2 weeks. He was informed of the importance of frequent follow up visits to maximize his success with intensive lifestyle modifications for his multiple health conditions.   OBESITY BEHAVIORAL INTERVENTION VISIT  Today's visit was # 13 out of 22.  Starting weight: 237 lbs Starting date: 08/18/17 Today's weight : 211 lbs Today's date: 03/14/2018 Total lbs lost to date: 74 (Patients must lose 7 lbs in the first 6 months to continue with counseling)   ASK: We discussed the diagnosis of obesity with [name] [name] Lei "[name] [name]" today and [name] [name] agreed to give [name] permission to discuss obesity behavioral modification therapy today.  ASSESS: [name] [name] has the diagnosis of obesity and his BMI today is 30.28 [name] [name] is in the action stage of change   ADVISE: [name] [name] was educated on the multiple health risks of obesity as well as the benefit of weight loss to improve his health. He was advised of the need for long term treatment and the importance of lifestyle modifications.  AGREE: Multiple dietary modification options and treatment options were discussed and  [name] [name] agreed to the above obesity treatment plan.  Corey Skains, am acting as transcriptionist for Dennard Nip, MD  I have reviewed the above  documentation for accuracy and completeness, and I agree with the above. -Dennard Nip, MD

## 2018-03-14 NOTE — Progress Notes (Signed)
Cardiology Office Note   Date:  03/14/2018   ID:  Sande Brothers", DOB [birth year], MRN [id number]  PCP:  Mosie Lukes, MD  Cardiologist: Dr. Ellyn Hack, 04/23/2015 [name] Ferries, PA-C   Chief Complaint  Patient presents with  . Follow-up    pt denied chest pain    History of Present Illness: [name] [name] "[name] [name]" is a 77 y.o. male with a history of A. fib with chronic bradycardia, nonobstructive CAD 2016 cath (radial spasm) after false + MV, BPH, GERD, HLD, Afib, elev PSA, OSA on CPAP  [name] [name] "[name] [name]" presents for cardiology follow up.  His wife and daughter are here with him today.  His daughter is a Software engineer in our office.  He has not had any palpitations. Does not get light-headed or dizzy.  Did not know he was in atrial fibrillation today.  Never has had chest pain. Is active around the house and exercises 2 x week.  Is able to walk upstairs and do lifting without any difficulty.  No orthopnea or PND, sleeps on 1 pillow. Compliant w/ CPAP. No LE edema, no DOE.   Right arm is chronically in a compression sleeve because of lymph nodes that were removed during surgery for lipomas.  He does not abuse alcohol, but has a brandy every night before supper.   Past Medical History:  Diagnosis Date  . BPH (benign prostatic hypertrophy)   . Bradycardia 12/30/2014   HR routinely in mid 40s-50s  . Coronary artery disease (CAD) excluded April 2016   False-positive nuclear stress test suggesting inferior ischemia  . Elevated PSA   . Encounter for Medicare annual wellness exam 09/22/2013   Sees Dr Wilhemina Bonito of Derm Sees Dr Silvano Rusk of Gastroenterology Sees Dr Kathie Rhodes of [facility] Sees Dr Susa Day of Optometry  . Finger pain, left 04/12/2016   4th   . GERD (gastroesophageal reflux disease)    controlled w/ diet and behavioral changes  . Hypertriglyceridemia 03/16/2015  . Lymphadenitis   . Melanoma of back ([place])    Excised Dr Wilhemina Bonito  . New  onset a-fib ([place]) 03/10/2015  . Otitis, externa, infective 08/10/2015  . Palpitations   . Post herpetic neuralgia   . Shingles 07/09/2013  . Sleep apnea 07/30/2011   cpap- 13   . Snoring disorder 07/30/2011  . White coat hypertension     Past Surgical History:  Procedure Laterality Date  . CATARACT EXTRACTION Right   . EYE SURGERY Right 12/06/2017   cataract by Dr Gillian Scarce  . LEFT HEART CATHETERIZATION WITH CORONARY ANGIOGRAM N/A 04/10/2015   Procedure: LEFT HEART CATHETERIZATION WITH  CORONARY ANGIOGRAM;  Surgeon: Leonie Man, MD;  Location: [facility] CATH LAB;  Service: Cardiovascular;  Angiographicallly NORMAL CORONARY ARTERIES  . MASS EXCISION N/A 05/16/2014   Procedure: EXCISION POSTERIOR NECK MASS, RIGHT CHEST WALL MASS AND RIGHT AXILLARY MASS AXILLARY NODE DISSECTION;  Surgeon: Adin Hector, MD;  Location: [facility] ORS;  Service: General;  Laterality: N/A;  . NM MYOVIEW LTD  03/19/2015   FALSE POSITIVE:  INTERMEDIATE RISK. Small sized, moderate intensity inferior ischemic perfusion defect  . TRANSTHORACIC ECHOCARDIOGRAM  03/20/2015   Normal LV size and function. EF 60-65%. G1 DD. Trivial AI. Borderline aortic root dilation (41 mm), Mild LA dilation.    Current Outpatient Medications  Medication Sig Dispense Refill  . Cholecalciferol (VITAMIN D3) 5000 units CAPS Take 1 capsule by mouth daily.    Marland Kitchen gabapentin (NEURONTIN) 300  MG capsule Take 3 capsules (900 mg total) by mouth 3 (three) times daily. 270 capsule 3  . KRILL OIL PO Take 1 capsule by mouth daily.    . Multiple Vitamin (MULTIVITAMIN WITH MINERALS) TABS tablet Take 1 tablet by mouth daily.     No current facility-administered medications for this visit.     Allergies:   Patient has no known allergies.    Social History:  The patient  reports that he quit smoking about 29 years ago. His smoking use included pipe and cigarettes. he has never used smokeless tobacco. He reports that he drinks about 4.2 oz of alcohol per week. He  reports that he does not use drugs.   Family History:  The patient's family history includes Alcohol abuse in his father and other; COPD in his brother and paternal grandfather; Cancer in his mother and sister; Diabetes in his sister; Down syndrome in his brother; Emphysema in his father; Heart disease in his paternal grandmother; Obesity in his father, mother, and sister.    ROS:  Please see the history of present illness. All other systems are reviewed and negative.    PHYSICAL EXAM: VS:  BP 114/78   Pulse 84   Ht 5\' 10"  (1.778 m)   Wt 218 lb (98.9 kg)   BMI 31.28 kg/m  , BMI Body mass index is 31.28 kg/m. GEN: Well nourished, well developed, male in no acute distress  HEENT: normal for age  Neck: no JVD, no carotid bruit, no masses Cardiac: Slow and slightly irregular rate and rhythm; no murmur, no rubs, or gallops Respiratory:  clear to auscultation bilaterally, normal work of breathing GI: soft, nontender, nondistended, + BS MS: no deformity or atrophy; + edema right arm only, compression sleeve in place; distal pulses are 2+ in all 4 extremities   Skin: warm and dry, no rash, several small wounds on his hands, healing well. Neuro:  Strength and sensation are intact Psych: euthymic mood, full affect   EKG:  EKG is ordered today. The ekg ordered today demonstrates atrial fibrillation, heart rate 84  ECHO: 03/20/2015 - Left ventricle: The cavity size was normal. Wall thickness was normal. Systolic function was normal. The estimated ejection fraction was in the range of 60% to 65%. Doppler parameters are consistent with abnormal left ventricular relaxation (grade 1 diastolic dysfunction). - Aortic valve: There was trivial regurgitation. - Aorta: Aortic root dimension: 41 mm (ED). - Ascending aorta: The ascending aorta was mildly dilated. - Left atrium: The atrium was mildly dilated.  Recent Labs: 08/18/2017: Hemoglobin 15.5; TSH 1.680 12/22/2017: ALT 18; BUN 22;  Creatinine, Ser 0.86; Potassium 5.1; Sodium 144    Lipid Panel    Component Value Date/Time   CHOL 145 12/22/2017 0831   TRIG 70 12/22/2017 0831   HDL 47 12/22/2017 0831   CHOLHDL 3 04/12/2016 1004   VLDL 21.0 04/12/2016 1004   LDLCALC 84 12/22/2017 0831     Wt Readings from Last 3 Encounters:  03/14/18 218 lb (98.9 kg)  02/28/18 216 lb (98 kg)  02/07/18 213 lb (96.6 kg)     Other studies Reviewed: Additional studies/ records that were reviewed today include: Office notes and testing.  ASSESSMENT AND PLAN:  1.  Persistent atrial fibrillation: He has no symptoms from it and his blood pressure is too low to add any rate lowering medications.  Additionally, he has had problems with bradycardia.  Follow for symptoms.  No symptoms reminiscent of CAD or CHF.  No  echo in 2 years.  Follow-up with Dr. Ellyn Hack and decide then if repeat echocardiogram needed.  2.  Anticoagulation: His chads 2 vascular score is 2, for age.  He is a candidate for anticoagulation.  Eliquis is likely the best one for him, he is encouraged to start this and take it twice a day.  He is encouraged to wear long sleeves, shoes and socks and use gloves when he is working on projects around the house and yard.  Report any bleeding issues.  After the patient left I reviewed medications and feel he should discontinue the krill oil.  I communicated this with his daughter who agrees.  She will let the patient know.   Current medicines are reviewed at length with the patient today.  The patient does not have concerns regarding medicines.  The following changes have been made: Add Eliquis  Labs/ tests ordered today include:  No orders of the defined types were placed in this encounter.    Disposition:   FU with Dr. Ellyn Hack  Signed, [name] Ferries, PA-C  03/14/2018 9:38 AM    [place] Phone: [phone]; Fax: [phone]  This note was written with the assistance of speech recognition  software. Please excuse any transcriptional errors.

## 2018-03-14 NOTE — Patient Instructions (Signed)
Medication Instructions:  START Eliquis 5 mg two times daily  Follow-Up: Your physician wants you to follow-up in: 6 months with Dr. Ellyn Hack.  You will receive a reminder letter in the mail two months in advance. If you don't receive a letter, please call our office to schedule the follow-up appointment.  Any Other Special Instructions Will Be Listed Below (If Applicable).     If you need a refill on your cardiac medications before your next appointment, please call your pharmacy.

## 2018-04-04 ENCOUNTER — Ambulatory Visit (INDEPENDENT_AMBULATORY_CARE_PROVIDER_SITE_OTHER): Payer: Medicare Other | Admitting: Family Medicine

## 2018-04-04 VITALS — BP 118/72 | HR 73 | Ht 70.0 in | Wt 211.0 lb

## 2018-04-04 DIAGNOSIS — I482 Chronic atrial fibrillation, unspecified: Secondary | ICD-10-CM

## 2018-04-04 DIAGNOSIS — E669 Obesity, unspecified: Secondary | ICD-10-CM | POA: Diagnosis not present

## 2018-04-04 DIAGNOSIS — Z6830 Body mass index (BMI) 30.0-30.9, adult: Secondary | ICD-10-CM

## 2018-04-04 NOTE — Progress Notes (Signed)
Office: [phone]  /  Fax: [phone]   HPI:   Chief Complaint: OBESITY [name] [name] is here to discuss his progress with his obesity treatment plan. He is on the keep a food journal with 1500-1800 calories and 100 grams of protein daily and is following his eating plan approximately 70-80 % of the time. He states he is doing low impact aerobics for 60 minutes 2 times per week. [name] [name] has done well with maintaining weight over last few weeks. He is journaling with paper and trying to increase protein but not always meeting his goals. He is getting ready to travel and will increase eating out.  His weight is 211 lb (95.7 kg) today and has not lost weight since his last visit. He has lost 26 lbs since starting treatment with [name].  Chronic Atrial Fibrillation [name] [name] is doing well taking Eliquis and working on weight loss to decrease the risk of exacerbation. He denies palpitations or easy bruising, or chest pains. He is feeling well overall.  ALLERGIES: No Known Allergies  MEDICATIONS: Current Outpatient Medications on File Prior to Visit  Medication Sig Dispense Refill  . apixaban (ELIQUIS) 5 MG TABS tablet Take 1 tablet (5 mg total) by mouth 2 (two) times daily. 180 tablet 3  . Cholecalciferol (VITAMIN D3) 5000 units CAPS Take 1 capsule by mouth daily.    Marland Kitchen gabapentin (NEURONTIN) 300 MG capsule Take 3 capsules (900 mg total) by mouth 3 (three) times daily. 270 capsule 3  . Multiple Vitamin (MULTIVITAMIN WITH MINERALS) TABS tablet Take 1 tablet by mouth daily.     No current facility-administered medications on file prior to visit.     PAST MEDICAL HISTORY: Past Medical History:  Diagnosis Date  . BPH (benign prostatic hypertrophy)   . Bradycardia 12/30/2014   HR routinely in mid 40s-50s  . Coronary artery disease (CAD) excluded April 2016   False-positive nuclear stress test suggesting inferior ischemia  . Elevated PSA   . Encounter for Medicare annual wellness exam 09/22/2013   Sees  Dr Wilhemina Bonito of Derm Sees Dr Silvano Rusk of Gastroenterology Sees Dr Kathie Rhodes of [facility] Sees Dr Susa Day of Optometry  . Finger pain, left 04/12/2016   4th   . GERD (gastroesophageal reflux disease)    controlled w/ diet and behavioral changes  . Hypertriglyceridemia 03/16/2015  . Lymphadenitis   . Melanoma of back ([place])    Excised Dr Wilhemina Bonito  . New onset a-fib ([place]) 03/10/2015  . Otitis, externa, infective 08/10/2015  . Palpitations   . Post herpetic neuralgia   . Shingles 07/09/2013  . Sleep apnea 07/30/2011   cpap- 13   . Snoring disorder 07/30/2011  . White coat hypertension     PAST SURGICAL HISTORY: Past Surgical History:  Procedure Laterality Date  . CATARACT EXTRACTION Right   . EYE SURGERY Right 12/06/2017   cataract by Dr Gillian Scarce  . LEFT HEART CATHETERIZATION WITH CORONARY ANGIOGRAM N/A 04/10/2015   Procedure: LEFT HEART CATHETERIZATION WITH  CORONARY ANGIOGRAM;  Surgeon: Leonie Man, MD;  Location: [facility] CATH LAB;  Service: Cardiovascular;  Angiographicallly NORMAL CORONARY ARTERIES  . MASS EXCISION N/A 05/16/2014   Procedure: EXCISION POSTERIOR NECK MASS, RIGHT CHEST WALL MASS AND RIGHT AXILLARY MASS AXILLARY NODE DISSECTION;  Surgeon: Adin Hector, MD;  Location: [facility] ORS;  Service: General;  Laterality: N/A;  . NM MYOVIEW LTD  03/19/2015   FALSE POSITIVE:  INTERMEDIATE RISK. Small sized, moderate intensity inferior ischemic perfusion defect  .  TRANSTHORACIC ECHOCARDIOGRAM  03/20/2015   Normal LV size and function. EF 60-65%. G1 DD. Trivial AI. Borderline aortic root dilation (41 mm), Mild LA dilation.    SOCIAL HISTORY: Social History   Tobacco Use  . Smoking status: Former Smoker    Types: Pipe, Cigarettes    Last attempt to quit: 02/11/1989    Years since quitting: 29.1  . Smokeless tobacco: Never Used  Substance Use Topics  . Alcohol use: Yes    Alcohol/week: 4.2 oz    Types: 7 Glasses of wine per week    Comment: 1 per day -- brandy or  wine  . Drug use: No    FAMILY HISTORY: Family History  Problem Relation Age of Onset  . Cancer Mother        MM, leukemia  . Obesity Mother   . Emphysema Father   . Obesity Father   . Alcohol abuse Father   . Cancer Sister        brain  . COPD Brother   . Heart disease Paternal Grandmother   . COPD Paternal Grandfather   . Diabetes Sister   . Obesity Sister   . Down syndrome Brother   . Alcohol abuse Other   . Colon cancer Neg Hx     ROS: Review of Systems  Constitutional: Negative for weight loss.  Cardiovascular: Negative for chest pain and palpitations.  Endo/Heme/Allergies: Does not bruise/bleed easily.    PHYSICAL EXAM: Blood pressure 118/72, pulse 73, height 5\' 10"  (1.778 m), weight 211 lb (95.7 kg), SpO2 97 %. Body mass index is 30.28 kg/m. Physical Exam  Constitutional: He is oriented to person, place, and time. He appears well-developed and well-nourished.  Cardiovascular: Normal rate.  Pulmonary/Chest: Effort normal.  Musculoskeletal: Normal range of motion.  Neurological: He is oriented to person, place, and time.  Skin: Skin is warm and dry.  Psychiatric: He has a normal mood and affect. His behavior is normal.  Vitals reviewed.   RECENT LABS AND TESTS: BMET    Component Value Date/Time   NA 144 12/22/2017 0831   K 5.1 12/22/2017 0831   CL 105 12/22/2017 0831   CO2 26 12/22/2017 0831   GLUCOSE 94 12/22/2017 0831   GLUCOSE 85 12/07/2016 1531   BUN 22 12/22/2017 0831   CREATININE 0.86 12/22/2017 0831   CREATININE 0.92 04/04/2015 1113   CALCIUM 9.3 12/22/2017 0831   GFRNONAA 96 12/22/2017 0831   GFRAA 111 12/22/2017 0831   Lab Results  Component Value Date   HGBA1C 5.8 (H) 12/22/2017   HGBA1C 5.8 (H) 08/18/2017   Lab Results  Component Value Date   INSULIN 12.1 12/22/2017   INSULIN 17.6 08/18/2017   CBC    Component Value Date/Time   WBC 6.7 08/18/2017 1059   WBC 8.4 12/07/2016 1531   RBC 4.88 08/18/2017 1059   RBC 4.80  12/07/2016 1531   HGB 15.5 08/18/2017 1059   HCT 46.8 08/18/2017 1059   PLT 185.0 12/07/2016 1531   MCV 96 08/18/2017 1059   MCH 31.8 08/18/2017 1059   MCH 28.8 04/04/2015 1113   MCHC 33.1 08/18/2017 1059   MCHC 34.3 12/07/2016 1531   RDW 14.3 08/18/2017 1059   LYMPHSABS 1.7 08/18/2017 1059   MONOABS 0.7 09/13/2013 1350   EOSABS 0.1 08/18/2017 1059   BASOSABS 0.0 08/18/2017 1059   Iron/TIBC/Ferritin/ %Sat No results found for: IRON, TIBC, FERRITIN, IRONPCTSAT Lipid Panel     Component Value Date/Time   CHOL 145 12/22/2017 0831  TRIG 70 12/22/2017 0831   HDL 47 12/22/2017 0831   CHOLHDL 3 04/12/2016 1004   VLDL 21.0 04/12/2016 1004   LDLCALC 84 12/22/2017 0831   Hepatic Function Panel     Component Value Date/Time   PROT 6.6 12/22/2017 0831   ALBUMIN 4.0 12/22/2017 0831   AST 19 12/22/2017 0831   ALT 18 12/22/2017 0831   ALKPHOS 55 12/22/2017 0831   BILITOT 0.6 12/22/2017 0831   BILIDIR 0.1 02/11/2014 1016   IBILI 0.3 02/11/2014 1016      Component Value Date/Time   TSH 1.680 08/18/2017 1059   TSH 1.80 12/07/2016 1531   TSH 1.77 04/12/2016 1004    ASSESSMENT AND PLAN: Chronic atrial fibrillation (HCC)  Class 1 obesity with serious comorbidity and body mass index (BMI) of 30.0 to 30.9 in adult, unspecified obesity type  PLAN:  Chronic Atrial Fibrillation [name] [name] will continue diet, exercise, and weight loss. He agrees to continue Eliquis as prescribed and will continue to monitor. [name] [name] agrees to follow up with our clinic in 3 weeks.  We spent > than 50% of the 15 minute visit on the counseling as documented in the note.  Obesity [name] [name] is currently in the action stage of change. As such, his goal is to continue with weight loss efforts He has agreed to keep a food journal with 1500-1800 calories and 100+ grams of protein daily [name] [name] has been instructed to work up to a goal of 150 minutes of combined cardio and strengthening exercise per week for weight loss  and overall health benefits. We discussed the following Behavioral Modification Strategies today: increasing lean protein intake, decrease liquid calories, and travel eating strategies   [name] [name] has agreed to follow up with our clinic in 3 weeks. He was informed of the importance of frequent follow up visits to maximize his success with intensive lifestyle modifications for his multiple health conditions.   OBESITY BEHAVIORAL INTERVENTION VISIT  Today's visit was # 14 out of 22.  Starting weight: 237 lbs Starting date: 08/18/17 Today's weight : 211 lbs Today's date: 04/04/2018 Total lbs lost to date: 39 (Patients must lose 7 lbs in the first 6 months to continue with counseling)   ASK: We discussed the diagnosis of obesity with [name] Lei "[name]" today and [name] [name] agreed to give [name] permission to discuss obesity behavioral modification therapy today.  ASSESS: Xzander has the diagnosis of obesity and his BMI today is 30.28 Bejamin is in the action stage of change   ADVISE: [name] [name] was educated on the multiple health risks of obesity as well as the benefit of weight loss to improve his health. He was advised of the need for long term treatment and the importance of lifestyle modifications.  AGREE: Multiple dietary modification options and treatment options were discussed and  [name] [name] agreed to the above obesity treatment plan.  I, Trixie Dredge, am acting as transcriptionist for Dennard Nip, MD  I have reviewed the above documentation for accuracy and completeness, and I agree with the above. -Dennard Nip, MD

## 2018-04-08 ENCOUNTER — Other Ambulatory Visit: Payer: Self-pay | Admitting: Family Medicine

## 2018-04-26 ENCOUNTER — Ambulatory Visit (INDEPENDENT_AMBULATORY_CARE_PROVIDER_SITE_OTHER): Payer: Medicare Other | Admitting: Family Medicine

## 2018-04-26 VITALS — BP 114/65 | HR 73 | Temp 97.4°F | Ht 70.0 in | Wt 210.0 lb

## 2018-04-26 DIAGNOSIS — E559 Vitamin D deficiency, unspecified: Secondary | ICD-10-CM | POA: Diagnosis not present

## 2018-04-26 DIAGNOSIS — Z6830 Body mass index (BMI) 30.0-30.9, adult: Secondary | ICD-10-CM | POA: Diagnosis not present

## 2018-04-26 DIAGNOSIS — E7849 Other hyperlipidemia: Secondary | ICD-10-CM

## 2018-04-26 DIAGNOSIS — R7303 Prediabetes: Secondary | ICD-10-CM | POA: Diagnosis not present

## 2018-04-26 DIAGNOSIS — E669 Obesity, unspecified: Secondary | ICD-10-CM

## 2018-04-26 NOTE — Progress Notes (Signed)
Office: [phone]  /  Fax: [phone]   HPI:   Chief Complaint: OBESITY [name] [name] is here to discuss his progress with his obesity treatment plan. He is on the keep a food journal with 1500-1800 calories and 100+ grams of protein daily and is following his eating plan approximately 80-85 % of the time. He states he is doing general fitness for 60 minutes 2 times per week. [name] [name] continues to do well with weight loss. He is journaling and doing well meeting his protein goals and eating 3 to 4 servings of vegetables daily.  His weight is 210 lb (95.3 kg) today and has had a weight loss of 1 pound over a period of 3 weeks since his last visit. He has lost 27 lbs since starting treatment with [name].  Vitamin D Deficiency [name] [name] has a diagnosis of vitamin D deficiency. He is stable on Vit D OTC, last level at goal. He denies nausea, vomiting or muscle weakness.  Hyperlipidemia [name] [name] has hyperlipidemia and is doing well with diet. He is not on statin and has been trying to improve his cholesterol levels with intensive lifestyle modification including a low saturated fat diet, exercise and weight loss. He denies any chest pain, claudication or myalgias.  Pre-Diabetes [name] [name] has a diagnosis of pre-diabetes based on his elevated Hgb A1c and was informed this puts him at greater risk of developing diabetes. He is doing well with diet prescription and weight loss. He is due for labs. He is not taking metformin currently and continues to work on diet and exercise to decrease risk of diabetes. He denies nausea or hypoglycemia.  ALLERGIES: No Known Allergies  MEDICATIONS: Current Outpatient Medications on File Prior to Visit  Medication Sig Dispense Refill  . apixaban (ELIQUIS) 5 MG TABS tablet Take 1 tablet (5 mg total) by mouth 2 (two) times daily. 180 tablet 3  . Cholecalciferol (VITAMIN D3) 5000 units CAPS Take 1 capsule by mouth daily.    Marland Kitchen gabapentin (NEURONTIN) 300 MG capsule TAKE 3 CAPSULES BY  MOUTH THREE TIMES DAILY 270 capsule 3  . Multiple Vitamin (MULTIVITAMIN WITH MINERALS) TABS tablet Take 1 tablet by mouth daily.     No current facility-administered medications on file prior to visit.     PAST MEDICAL HISTORY: Past Medical History:  Diagnosis Date  . BPH (benign prostatic hypertrophy)   . Bradycardia 12/30/2014   HR routinely in mid 40s-50s  . Coronary artery disease (CAD) excluded April 2016   False-positive nuclear stress test suggesting inferior ischemia  . Elevated PSA   . Encounter for Medicare annual wellness exam 09/22/2013   Sees Dr Wilhemina Bonito of Derm Sees Dr Silvano Rusk of Gastroenterology Sees Dr Kathie Rhodes of [facility] Sees Dr Susa Day of Optometry  . Finger pain, left 04/12/2016   4th   . GERD (gastroesophageal reflux disease)    controlled w/ diet and behavioral changes  . Hypertriglyceridemia 03/16/2015  . Lymphadenitis   . Melanoma of back ([place])    Excised Dr Wilhemina Bonito  . New onset a-fib ([place]) 03/10/2015  . Otitis, externa, infective 08/10/2015  . Palpitations   . Post herpetic neuralgia   . Shingles 07/09/2013  . Sleep apnea 07/30/2011   cpap- 13   . Snoring disorder 07/30/2011  . White coat hypertension     PAST SURGICAL HISTORY: Past Surgical History:  Procedure Laterality Date  . CATARACT EXTRACTION Right   . EYE SURGERY Right 12/06/2017   cataract by Dr Gillian Scarce  .  LEFT HEART CATHETERIZATION WITH CORONARY ANGIOGRAM N/A 04/10/2015   Procedure: LEFT HEART CATHETERIZATION WITH  CORONARY ANGIOGRAM;  Surgeon: Leonie Man, MD;  Location: [facility] CATH LAB;  Service: Cardiovascular;  Angiographicallly NORMAL CORONARY ARTERIES  . MASS EXCISION N/A 05/16/2014   Procedure: EXCISION POSTERIOR NECK MASS, RIGHT CHEST WALL MASS AND RIGHT AXILLARY MASS AXILLARY NODE DISSECTION;  Surgeon: Adin Hector, MD;  Location: [facility] ORS;  Service: General;  Laterality: N/A;  . NM MYOVIEW LTD  03/19/2015   FALSE POSITIVE:  INTERMEDIATE RISK. Small sized,  moderate intensity inferior ischemic perfusion defect  . TRANSTHORACIC ECHOCARDIOGRAM  03/20/2015   Normal LV size and function. EF 60-65%. G1 DD. Trivial AI. Borderline aortic root dilation (41 mm), Mild LA dilation.    SOCIAL HISTORY: Social History   Tobacco Use  . Smoking status: Former Smoker    Types: Pipe, Cigarettes    Last attempt to quit: 02/11/1989    Years since quitting: 29.2  . Smokeless tobacco: Never Used  Substance Use Topics  . Alcohol use: Yes    Alcohol/week: 4.2 oz    Types: 7 Glasses of wine per week    Comment: 1 per day -- brandy or wine  . Drug use: No    FAMILY HISTORY: Family History  Problem Relation Age of Onset  . Cancer Mother        MM, leukemia  . Obesity Mother   . Emphysema Father   . Obesity Father   . Alcohol abuse Father   . Cancer Sister        brain  . COPD Brother   . Heart disease Paternal Grandmother   . COPD Paternal Grandfather   . Diabetes Sister   . Obesity Sister   . Down syndrome Brother   . Alcohol abuse Other   . Colon cancer Neg Hx     ROS: Review of Systems  Constitutional: Positive for weight loss.  Cardiovascular: Negative for chest pain and claudication.  Gastrointestinal: Negative for nausea and vomiting.  Musculoskeletal: Negative for myalgias.       Negative muscle weakness  Endo/Heme/Allergies:       Negative hypoglycemia    PHYSICAL EXAM: Blood pressure 114/65, pulse 73, temperature (!) 97.4 F (36.3 C), temperature source Oral, height 5\' 10"  (1.778 m), weight 210 lb (95.3 kg), SpO2 99 %. Body mass index is 30.13 kg/m. Physical Exam  Constitutional: He is oriented to person, place, and time. He appears well-developed and well-nourished.  Cardiovascular: Normal rate.  Pulmonary/Chest: Effort normal.  Musculoskeletal: Normal range of motion.  Neurological: He is oriented to person, place, and time.  Skin: Skin is warm and dry.  Psychiatric: He has a normal mood and affect. His behavior is  normal.  Vitals reviewed.   RECENT LABS AND TESTS: BMET    Component Value Date/Time   NA 144 12/22/2017 0831   K 5.1 12/22/2017 0831   CL 105 12/22/2017 0831   CO2 26 12/22/2017 0831   GLUCOSE 94 12/22/2017 0831   GLUCOSE 85 12/07/2016 1531   BUN 22 12/22/2017 0831   CREATININE 0.86 12/22/2017 0831   CREATININE 0.92 04/04/2015 1113   CALCIUM 9.3 12/22/2017 0831   GFRNONAA 96 12/22/2017 0831   GFRAA 111 12/22/2017 0831   Lab Results  Component Value Date   HGBA1C 5.8 (H) 12/22/2017   HGBA1C 5.8 (H) 08/18/2017   Lab Results  Component Value Date   INSULIN 12.1 12/22/2017   INSULIN 17.6 08/18/2017   CBC  Component Value Date/Time   WBC 6.7 08/18/2017 1059   WBC 8.4 12/07/2016 1531   RBC 4.88 08/18/2017 1059   RBC 4.80 12/07/2016 1531   HGB 15.5 08/18/2017 1059   HCT 46.8 08/18/2017 1059   PLT 185.0 12/07/2016 1531   MCV 96 08/18/2017 1059   MCH 31.8 08/18/2017 1059   MCH 28.8 04/04/2015 1113   MCHC 33.1 08/18/2017 1059   MCHC 34.3 12/07/2016 1531   RDW 14.3 08/18/2017 1059   LYMPHSABS 1.7 08/18/2017 1059   MONOABS 0.7 09/13/2013 1350   EOSABS 0.1 08/18/2017 1059   BASOSABS 0.0 08/18/2017 1059   Iron/TIBC/Ferritin/ %Sat No results found for: IRON, TIBC, FERRITIN, IRONPCTSAT Lipid Panel     Component Value Date/Time   CHOL 145 12/22/2017 0831   TRIG 70 12/22/2017 0831   HDL 47 12/22/2017 0831   CHOLHDL 3 04/12/2016 1004   VLDL 21.0 04/12/2016 1004   LDLCALC 84 12/22/2017 0831   Hepatic Function Panel     Component Value Date/Time   PROT 6.6 12/22/2017 0831   ALBUMIN 4.0 12/22/2017 0831   AST 19 12/22/2017 0831   ALT 18 12/22/2017 0831   ALKPHOS 55 12/22/2017 0831   BILITOT 0.6 12/22/2017 0831   BILIDIR 0.1 02/11/2014 1016   IBILI 0.3 02/11/2014 1016      Component Value Date/Time   TSH 1.680 08/18/2017 1059   TSH 1.80 12/07/2016 1531   TSH 1.77 04/12/2016 1004  Results for KASHMERE, STAFFA (MRN [id number]) as of 04/26/2018 15:25  Ref. Range  12/22/2017 08:31  Vitamin D, 25-Hydroxy Latest Ref Range: 30.0 - 100.0 ng/mL 56.8    ASSESSMENT AND PLAN: Vitamin D deficiency - Plan: VITAMIN D 25 Hydroxy (Vit-D Deficiency, Fractures), CANCELED: VITAMIN D 25 Hydroxy (Vit-D Deficiency, Fractures)  Other hyperlipidemia - Plan: Comprehensive metabolic panel, Lipid Panel With LDL/HDL Ratio, CANCELED: Comprehensive metabolic panel, CANCELED: Lipid Panel With LDL/HDL Ratio  Prediabetes - Plan: Hemoglobin A1c, Insulin, random, CANCELED: Hemoglobin A1c, CANCELED: Insulin, random  Class 1 obesity with serious comorbidity and body mass index (BMI) of 30.0 to 30.9 in adult, unspecified obesity type  PLAN:  Vitamin D Deficiency [name] [name] was informed that low vitamin D levels contributes to fatigue and are associated with obesity, breast, and colon cancer. He will follow up for routine testing of vitamin D, at least 2-3 times per year. He was informed of the risk of over-replacement of vitamin D and agrees to not increase his dose unless he discusses this with [name] first. We will check labs and Eldrige agrees to follow up with our clinic in 4 weeks.  Hyperlipidemia [name] [name] was informed of the American Heart Association Guidelines emphasizing intensive lifestyle modifications as the first line treatment for hyperlipidemia. We discussed many lifestyle modifications today in depth, and [name] [name] will continue to work on decreasing saturated fats such as fatty red meat, butter and many fried foods. He will also increase vegetables and lean protein in his diet and continue to work on exercise and weight loss efforts. We will check labs and Rickie agrees to follow up with our clinic in 4 weeks.  Pre-Diabetes [name] [name] will continue to work on weight loss, diet, exercise, and decreasing simple carbohydrates in his diet to help decrease the risk of diabetes. We dicussed metformin including benefits and risks. He was informed that eating too many simple carbohydrates or too many  calories at one sitting increases the likelihood of GI side effects. [name] [name] declined metformin for now and a prescription was not written  today. We will check labs and Georgi agrees to follow up with our clinic in 4 weeks as directed to monitor his progress.  Obesity [name] [name] is currently in the action stage of change. As such, his goal is to continue with weight loss efforts He has agreed to keep a food journal with 1500-1800 calories and 100+ grams of protein daily [name] [name] has been instructed to work up to a goal of 150 minutes of combined cardio and strengthening exercise per week for weight loss and overall health benefits. We discussed the following Behavioral Modification Strategies today: increasing lean protein intake, decreasing simple carbohydrates  and work on meal planning and easy cooking plans   [name] [name] has agreed to follow up with our clinic in 4 weeks. He was informed of the importance of frequent follow up visits to maximize his success with intensive lifestyle modifications for his multiple health conditions.   OBESITY BEHAVIORAL INTERVENTION VISIT  Today's visit was # 15 out of 22.  Starting weight: 237 lbs Starting date: 08/18/17 Today's weight : 210 lbs  Today's date: 04/26/2018 Total lbs lost to date: 2 (Patients must lose 7 lbs in the first 6 months to continue with counseling)   ASK: We discussed the diagnosis of obesity with [name] [name] today and Adriene agreed to give [name] permission to discuss obesity behavioral modification therapy today.  ASSESS: [name] [name] has the diagnosis of obesity and his BMI today is 30.13 [name] [name] is in the action stage of change   ADVISE: [name] [name] was educated on the multiple health risks of obesity as well as the benefit of weight loss to improve his health. He was advised of the need for long term treatment and the importance of lifestyle modifications.  AGREE: Multiple dietary modification options and treatment options were discussed and  [name] [name]  agreed to the above obesity treatment plan.  I, Trixie Dredge, am acting as transcriptionist for Dennard Nip, MD  I have reviewed the above documentation for accuracy and completeness, and I agree with the above. -Dennard Nip, MD

## 2018-04-27 LAB — INSULIN, RANDOM: INSULIN: 6.4 u[IU]/mL (ref 2.6–24.9)

## 2018-04-27 LAB — COMPREHENSIVE METABOLIC PANEL
ALBUMIN: 4.1 g/dL (ref 3.5–4.8)
ALK PHOS: 68 IU/L (ref 39–117)
ALT: 14 IU/L (ref 0–44)
AST: 18 IU/L (ref 0–40)
Albumin/Globulin Ratio: 1.6 (ref 1.2–2.2)
BILIRUBIN TOTAL: 0.4 mg/dL (ref 0.0–1.2)
BUN / CREAT RATIO: 20 (ref 10–24)
BUN: 20 mg/dL (ref 8–27)
CHLORIDE: 105 mmol/L (ref 96–106)
CO2: 25 mmol/L (ref 20–29)
CREATININE: 1.02 mg/dL (ref 0.76–1.27)
Calcium: 9.3 mg/dL (ref 8.6–10.2)
GFR calc Af Amer: 83 mL/min/{1.73_m2} (ref >59)
GFR calc non Af Amer: 72 mL/min/{1.73_m2} (ref >59)
GLUCOSE: 89 mg/dL (ref 65–99)
Globulin, Total: 2.5 g/dL (ref 1.5–4.5)
Potassium: 4.6 mmol/L (ref 3.5–5.2)
Sodium: 142 mmol/L (ref 134–144)
Total Protein: 6.6 g/dL (ref 6.0–8.5)

## 2018-04-27 LAB — LIPID PANEL WITH LDL/HDL RATIO
CHOLESTEROL TOTAL: 131 mg/dL (ref 100–199)
HDL: 50 mg/dL (ref >39)
LDL CALC: 70 mg/dL (ref 0–99)
LDl/HDL Ratio: 1.4 ratio (ref 0.0–3.6)
Triglycerides: 56 mg/dL (ref 0–149)
VLDL Cholesterol Cal: 11 mg/dL (ref 5–40)

## 2018-04-27 LAB — HEMOGLOBIN A1C
ESTIMATED AVERAGE GLUCOSE: 117 mg/dL
HEMOGLOBIN A1C: 5.7 % — AB (ref 4.8–5.6)

## 2018-04-27 LAB — VITAMIN D 25 HYDROXY (VIT D DEFICIENCY, FRACTURES): Vit D, 25-Hydroxy: 68.8 ng/mL (ref 30.0–100.0)

## 2018-05-10 DIAGNOSIS — L570 Actinic keratosis: Secondary | ICD-10-CM | POA: Diagnosis not present

## 2018-05-10 DIAGNOSIS — D2262 Melanocytic nevi of left upper limb, including shoulder: Secondary | ICD-10-CM | POA: Diagnosis not present

## 2018-05-10 DIAGNOSIS — D1801 Hemangioma of skin and subcutaneous tissue: Secondary | ICD-10-CM | POA: Diagnosis not present

## 2018-05-10 DIAGNOSIS — D2261 Melanocytic nevi of right upper limb, including shoulder: Secondary | ICD-10-CM | POA: Diagnosis not present

## 2018-05-10 DIAGNOSIS — L821 Other seborrheic keratosis: Secondary | ICD-10-CM | POA: Diagnosis not present

## 2018-05-10 DIAGNOSIS — D225 Melanocytic nevi of trunk: Secondary | ICD-10-CM | POA: Diagnosis not present

## 2018-05-10 DIAGNOSIS — Z85820 Personal history of malignant melanoma of skin: Secondary | ICD-10-CM | POA: Diagnosis not present

## 2018-05-10 DIAGNOSIS — D692 Other nonthrombocytopenic purpura: Secondary | ICD-10-CM | POA: Diagnosis not present

## 2018-05-24 ENCOUNTER — Ambulatory Visit (INDEPENDENT_AMBULATORY_CARE_PROVIDER_SITE_OTHER): Payer: Medicare Other | Admitting: Family Medicine

## 2018-05-24 VITALS — BP 115/79 | HR 73 | Temp 97.4°F | Ht 70.0 in | Wt 207.0 lb

## 2018-05-24 DIAGNOSIS — Z6830 Body mass index (BMI) 30.0-30.9, adult: Secondary | ICD-10-CM

## 2018-05-24 DIAGNOSIS — E669 Obesity, unspecified: Secondary | ICD-10-CM

## 2018-05-24 DIAGNOSIS — R7303 Prediabetes: Secondary | ICD-10-CM

## 2018-05-24 NOTE — Progress Notes (Signed)
Office: [phone]  /  Fax: [phone]   HPI:   Chief Complaint: OBESITY [name] [name] is here to discuss his progress with his obesity treatment plan. He is on the keep a food journal with 1500-1800 calories and 100+ grams of protein daily and is following his eating plan approximately 75 % of the time. He states he is doing yard work and shop work. [name] [name] continues to do well with weight loss, journaling approximately 75 % of the time and did well meeting his calories. He wants to discuss vacation strategies.  His weight is 207 lb (93.9 kg) today and has had a weight loss of 3 pounds over a period of 4 weeks since his last visit. He has lost 30 lbs since starting treatment with [name].  Pre-Diabetes Per has a diagnosis of pre-diabetes based on his elevated Hgb A1c and was informed this puts him at greater risk of developing diabetes. He is doing very well on diet prescription, polyphagia has improved. He is exercising daily and working on increasing lean protein. He denies nausea or hypoglycemia.  ALLERGIES: No Known Allergies  MEDICATIONS: Current Outpatient Medications on File Prior to Visit  Medication Sig Dispense Refill  . apixaban (ELIQUIS) 5 MG TABS tablet Take 1 tablet (5 mg total) by mouth 2 (two) times daily. 180 tablet 3  . Cholecalciferol (VITAMIN D3) 5000 units CAPS Take 1 capsule by mouth daily.    Marland Kitchen gabapentin (NEURONTIN) 300 MG capsule TAKE 3 CAPSULES BY MOUTH THREE TIMES DAILY 270 capsule 3  . Multiple Vitamin (MULTIVITAMIN WITH MINERALS) TABS tablet Take 1 tablet by mouth daily.     No current facility-administered medications on file prior to visit.     PAST MEDICAL HISTORY: Past Medical History:  Diagnosis Date  . BPH (benign prostatic hypertrophy)   . Bradycardia 12/30/2014   HR routinely in mid 40s-50s  . Coronary artery disease (CAD) excluded April 2016   False-positive nuclear stress test suggesting inferior ischemia  . Elevated PSA   . Encounter for Medicare  annual wellness exam 09/22/2013   Sees Dr Wilhemina Bonito of Derm Sees Dr Silvano Rusk of Gastroenterology Sees Dr Kathie Rhodes of [facility] Sees Dr Susa Day of Optometry  . Finger pain, left 04/12/2016   4th   . GERD (gastroesophageal reflux disease)    controlled w/ diet and behavioral changes  . Hypertriglyceridemia 03/16/2015  . Lymphadenitis   . Melanoma of back ([place])    Excised Dr Wilhemina Bonito  . New onset a-fib ([place]) 03/10/2015  . Otitis, externa, infective 08/10/2015  . Palpitations   . Post herpetic neuralgia   . Shingles 07/09/2013  . Sleep apnea 07/30/2011   cpap- 13   . Snoring disorder 07/30/2011  . White coat hypertension     PAST SURGICAL HISTORY: Past Surgical History:  Procedure Laterality Date  . CATARACT EXTRACTION Right   . EYE SURGERY Right 12/06/2017   cataract by Dr Gillian Scarce  . LEFT HEART CATHETERIZATION WITH CORONARY ANGIOGRAM N/A 04/10/2015   Procedure: LEFT HEART CATHETERIZATION WITH  CORONARY ANGIOGRAM;  Surgeon: Leonie Man, MD;  Location: [facility] CATH LAB;  Service: Cardiovascular;  Angiographicallly NORMAL CORONARY ARTERIES  . MASS EXCISION N/A 05/16/2014   Procedure: EXCISION POSTERIOR NECK MASS, RIGHT CHEST WALL MASS AND RIGHT AXILLARY MASS AXILLARY NODE DISSECTION;  Surgeon: Adin Hector, MD;  Location: [facility] ORS;  Service: General;  Laterality: N/A;  . NM MYOVIEW LTD  03/19/2015   FALSE POSITIVE:  INTERMEDIATE RISK. Small sized,  moderate intensity inferior ischemic perfusion defect  . TRANSTHORACIC ECHOCARDIOGRAM  03/20/2015   Normal LV size and function. EF 60-65%. G1 DD. Trivial AI. Borderline aortic root dilation (41 mm), Mild LA dilation.    SOCIAL HISTORY: Social History   Tobacco Use  . Smoking status: Former Smoker    Types: Pipe, Cigarettes    Last attempt to quit: 02/11/1989    Years since quitting: 29.2  . Smokeless tobacco: Never Used  Substance Use Topics  . Alcohol use: Yes    Alcohol/week: 4.2 oz    Types: 7 Glasses of wine per  week    Comment: 1 per day -- brandy or wine  . Drug use: No    FAMILY HISTORY: Family History  Problem Relation Age of Onset  . Cancer Mother        MM, leukemia  . Obesity Mother   . Emphysema Father   . Obesity Father   . Alcohol abuse Father   . Cancer Sister        brain  . COPD Brother   . Heart disease Paternal Grandmother   . COPD Paternal Grandfather   . Diabetes Sister   . Obesity Sister   . Down syndrome Brother   . Alcohol abuse Other   . Colon cancer Neg Hx     ROS: Review of Systems  Constitutional: Positive for weight loss.  Endo/Heme/Allergies:       Positive polyphagia Negative hypoglycemia    PHYSICAL EXAM: Blood pressure 115/79, pulse 73, temperature (!) 97.4 F (36.3 C), temperature source Oral, height 5\' 10"  (1.778 m), weight 207 lb (93.9 kg), SpO2 100 %. Body mass index is 29.7 kg/m. Physical Exam  Constitutional: He is oriented to person, place, and time. He appears well-developed and well-nourished.  Cardiovascular: Normal rate.  Pulmonary/Chest: Effort normal.  Musculoskeletal: Normal range of motion.  Neurological: He is oriented to person, place, and time.  Skin: Skin is warm and dry.  Psychiatric: He has a normal mood and affect. His behavior is normal.  Vitals reviewed.   RECENT LABS AND TESTS: BMET    Component Value Date/Time   NA 142 04/26/2018 0902   K 4.6 04/26/2018 0902   CL 105 04/26/2018 0902   CO2 25 04/26/2018 0902   GLUCOSE 89 04/26/2018 0902   GLUCOSE 85 12/07/2016 1531   BUN 20 04/26/2018 0902   CREATININE 1.02 04/26/2018 0902   CREATININE 0.92 04/04/2015 1113   CALCIUM 9.3 04/26/2018 0902   GFRNONAA 72 04/26/2018 0902   GFRAA 83 04/26/2018 0902   Lab Results  Component Value Date   HGBA1C 5.7 (H) 04/26/2018   HGBA1C 5.8 (H) 12/22/2017   HGBA1C 5.8 (H) 08/18/2017   Lab Results  Component Value Date   INSULIN 6.4 04/26/2018   INSULIN 12.1 12/22/2017   INSULIN 17.6 08/18/2017   CBC    Component  Value Date/Time   WBC 6.7 08/18/2017 1059   WBC 8.4 12/07/2016 1531   RBC 4.88 08/18/2017 1059   RBC 4.80 12/07/2016 1531   HGB 15.5 08/18/2017 1059   HCT 46.8 08/18/2017 1059   PLT 185.0 12/07/2016 1531   MCV 96 08/18/2017 1059   MCH 31.8 08/18/2017 1059   MCH 28.8 04/04/2015 1113   MCHC 33.1 08/18/2017 1059   MCHC 34.3 12/07/2016 1531   RDW 14.3 08/18/2017 1059   LYMPHSABS 1.7 08/18/2017 1059   MONOABS 0.7 09/13/2013 1350   EOSABS 0.1 08/18/2017 1059   BASOSABS 0.0 08/18/2017 1059  Iron/TIBC/Ferritin/ %Sat No results found for: IRON, TIBC, FERRITIN, IRONPCTSAT Lipid Panel     Component Value Date/Time   CHOL 131 04/26/2018 0902   TRIG 56 04/26/2018 0902   HDL 50 04/26/2018 0902   CHOLHDL 3 04/12/2016 1004   VLDL 21.0 04/12/2016 1004   LDLCALC 70 04/26/2018 0902   Hepatic Function Panel     Component Value Date/Time   PROT 6.6 04/26/2018 0902   ALBUMIN 4.1 04/26/2018 0902   AST 18 04/26/2018 0902   ALT 14 04/26/2018 0902   ALKPHOS 68 04/26/2018 0902   BILITOT 0.4 04/26/2018 0902   BILIDIR 0.1 02/11/2014 1016   IBILI 0.3 02/11/2014 1016      Component Value Date/Time   TSH 1.680 08/18/2017 1059   TSH 1.80 12/07/2016 1531   TSH 1.77 04/12/2016 1004    ASSESSMENT AND PLAN: Prediabetes  Class 1 obesity with serious comorbidity and body mass index (BMI) of 30.0 to 30.9 in adult, unspecified obesity type - Starting BMI greater then 30  PLAN:  Pre-Diabetes Darcey will continue to work on weight loss, diet, exercise, and plan, and decreasing simple carbohydrates in his diet to help decrease the risk of diabetes. We dicussed metformin including benefits and risks. He was informed that eating too many simple carbohydrates or too many calories at one sitting increases the likelihood of GI side effects. Chrisangel declined metformin for now and a prescription was not written today. We will recheck labs in 1 month and Tramell agrees to follow up with our clinic in 4 to 5 weeks  as directed to monitor his progress.  We spent > than 50% of the 30 minute visit on the counseling as documented in the note.  Obesity Kymir is currently in the action stage of change. As such, his goal is to continue with weight loss efforts He has agreed to keep a food journal with 1500-1800 calories and 100+ grams of protein daily Peirce has been instructed to work up to a goal of 150 minutes of combined cardio and strengthening exercise per week for weight loss and overall health benefits. We discussed the following Behavioral Modification Strategies today: work on meal planning and easy cooking plans, holiday eating strategies, travel eating strategies, and better snacking choices   Tayshaun has agreed to follow up with our clinic in 4 to 5 weeks. He was informed of the importance of frequent follow up visits to maximize his success with intensive lifestyle modifications for his multiple health conditions.   OBESITY BEHAVIORAL INTERVENTION VISIT  Today's visit was # 16 out of 22.  Starting weight: 237 lbs Starting date: 08/18/17 Today's weight : 207 lbs Today's date: 05/24/2018 Total lbs lost to date: 30 (Patients must lose 7 lbs in the first 6 months to continue with counseling)   ASK: We discussed the diagnosis of obesity with [name] today and Jozsef agreed to give [name] permission to discuss obesity behavioral modification therapy today.  ASSESS: Holton has the diagnosis of obesity and his BMI today is 29.7 Joesiah is in the action stage of change   ADVISE: Brysun was educated on the multiple health risks of obesity as well as the benefit of weight loss to improve his health. He was advised of the need for long term treatment and the importance of lifestyle modifications.  AGREE: Multiple dietary modification options and treatment options were discussed and  [place] agreed to the above obesity treatment plan.  Wilhemena Durie, am acting as Location manager for [employer]  Leafy Ro, MD  I have reviewed the above documentation for accuracy and completeness, and I agree with the above. -Dennard Nip, MD

## 2018-06-26 ENCOUNTER — Ambulatory Visit (INDEPENDENT_AMBULATORY_CARE_PROVIDER_SITE_OTHER): Payer: Medicare Other | Admitting: Family Medicine

## 2018-06-26 VITALS — BP 108/54 | HR 141 | Temp 97.4°F | Ht 70.0 in | Wt 209.0 lb

## 2018-06-26 DIAGNOSIS — R7303 Prediabetes: Secondary | ICD-10-CM

## 2018-06-26 DIAGNOSIS — Z6830 Body mass index (BMI) 30.0-30.9, adult: Secondary | ICD-10-CM

## 2018-06-26 DIAGNOSIS — I4891 Unspecified atrial fibrillation: Secondary | ICD-10-CM | POA: Diagnosis not present

## 2018-06-26 DIAGNOSIS — E669 Obesity, unspecified: Secondary | ICD-10-CM | POA: Diagnosis not present

## 2018-06-26 NOTE — Progress Notes (Signed)
Office: [phone]  /  Fax: [phone]   HPI:   Chief Complaint: OBESITY [name] [name] is here to discuss his progress with his obesity treatment plan. He is on the keep a food journal with 1500-1800 calories and 100+ grams of protein daily and is following his eating plan approximately 50 % of the time. He states he was walking while on vacation. [name] [name] did well overall on vacation. Able to portion control meals. Reports he is able to get back on track easily upon return.  His weight is 209 lb (94.8 kg) today and has gained 2 pounds since his last visit. He has lost 28 lbs since starting treatment with [name].  Pre-Diabetes [name] [name] has a diagnosis of pre-diabetes based on his elevated Hgb A1c and was informed this puts him at greater risk of developing diabetes. He is not currently on medication as he declined metformin last visit. He continues to work on diet and exercise to decrease risk of diabetes. He denies nausea or hypoglycemia.  Atrial Fibrillation [name] [name]'s heart rate of 140 upon initial intake today. After re-examination, his heart rate is 85, irregularly irregular. [name] [name] is on Eliquis, he denies palpitations, shortness of breath, or chest pain.  ALLERGIES: No Known Allergies  MEDICATIONS: Current Outpatient Medications on File Prior to Visit  Medication Sig Dispense Refill  . apixaban (ELIQUIS) 5 MG TABS tablet Take 1 tablet (5 mg total) by mouth 2 (two) times daily. 180 tablet 3  . Cholecalciferol (VITAMIN D3) 5000 units CAPS Take 1 capsule by mouth daily.    Marland Kitchen gabapentin (NEURONTIN) 300 MG capsule TAKE 3 CAPSULES BY MOUTH THREE TIMES DAILY 270 capsule 3  . Multiple Vitamin (MULTIVITAMIN WITH MINERALS) TABS tablet Take 1 tablet by mouth daily.     No current facility-administered medications on file prior to visit.     PAST MEDICAL HISTORY: Past Medical History:  Diagnosis Date  . BPH (benign prostatic hypertrophy)   . Bradycardia 12/30/2014   HR routinely in mid 40s-50s  .  Coronary artery disease (CAD) excluded April 2016   False-positive nuclear stress test suggesting inferior ischemia  . Elevated PSA   . Encounter for Medicare annual wellness exam 09/22/2013   Sees Dr Wilhemina Bonito of Derm Sees Dr Silvano Rusk of Gastroenterology Sees Dr Kathie Rhodes of [facility] Sees Dr Susa Day of Optometry  . Finger pain, left 04/12/2016   4th   . GERD (gastroesophageal reflux disease)    controlled w/ diet and behavioral changes  . Hypertriglyceridemia 03/16/2015  . Lymphadenitis   . Melanoma of back ([place])    Excised Dr Wilhemina Bonito  . New onset a-fib ([place]) 03/10/2015  . Otitis, externa, infective 08/10/2015  . Palpitations   . Post herpetic neuralgia   . Shingles 07/09/2013  . Sleep apnea 07/30/2011   cpap- 13   . Snoring disorder 07/30/2011  . White coat hypertension     PAST SURGICAL HISTORY: Past Surgical History:  Procedure Laterality Date  . CATARACT EXTRACTION Right   . EYE SURGERY Right 12/06/2017   cataract by Dr Gillian Scarce  . LEFT HEART CATHETERIZATION WITH CORONARY ANGIOGRAM N/A 04/10/2015   Procedure: LEFT HEART CATHETERIZATION WITH  CORONARY ANGIOGRAM;  Surgeon: Leonie Man, MD;  Location: [facility] CATH LAB;  Service: Cardiovascular;  Angiographicallly NORMAL CORONARY ARTERIES  . MASS EXCISION N/A 05/16/2014   Procedure: EXCISION POSTERIOR NECK MASS, RIGHT CHEST WALL MASS AND RIGHT AXILLARY MASS AXILLARY NODE DISSECTION;  Surgeon: Adin Hector, MD;  Location: [facility] ORS;  Service: General;  Laterality: N/A;  . NM MYOVIEW LTD  03/19/2015   FALSE POSITIVE:  INTERMEDIATE RISK. Small sized, moderate intensity inferior ischemic perfusion defect  . TRANSTHORACIC ECHOCARDIOGRAM  03/20/2015   Normal LV size and function. EF 60-65%. G1 DD. Trivial AI. Borderline aortic root dilation (41 mm), Mild LA dilation.    SOCIAL HISTORY: Social History   Tobacco Use  . Smoking status: Former Smoker    Types: Pipe, Cigarettes    Last attempt to quit: 02/11/1989     Years since quitting: 29.3  . Smokeless tobacco: Never Used  Substance Use Topics  . Alcohol use: Yes    Alcohol/week: 4.2 oz    Types: 7 Glasses of wine per week    Comment: 1 per day -- brandy or wine  . Drug use: No    FAMILY HISTORY: Family History  Problem Relation Age of Onset  . Cancer Mother        MM, leukemia  . Obesity Mother   . Emphysema Father   . Obesity Father   . Alcohol abuse Father   . Cancer Sister        brain  . COPD Brother   . Heart disease Paternal Grandmother   . COPD Paternal Grandfather   . Diabetes Sister   . Obesity Sister   . Down syndrome Brother   . Alcohol abuse Other   . Colon cancer Neg Hx     ROS: Review of Systems  Constitutional: Negative for weight loss.  Respiratory: Negative for shortness of breath.   Cardiovascular: Negative for chest pain and palpitations.  Gastrointestinal: Negative for nausea.  Endo/Heme/Allergies:       Negative hypoglycemia    PHYSICAL EXAM: Blood pressure (!) 108/54, pulse (!) 141, temperature (!) 97.4 F (36.3 C), temperature source Oral, height 5\' 10"  (1.778 m), weight 209 lb (94.8 kg), SpO2 94 %. Body mass index is 29.99 kg/m. Physical Exam  Constitutional: He is oriented to person, place, and time. He appears well-developed and well-nourished.  Cardiovascular: Normal rate.  No murmur (irregularly irregular, no murmur or rubs) heard. Pulmonary/Chest: Effort normal.  Musculoskeletal: Normal range of motion.  Neurological: He is alert and oriented to person, place, and time.  Skin: Skin is warm and dry.  Psychiatric: He has a normal mood and affect. His behavior is normal.  Vitals reviewed.   RECENT LABS AND TESTS: BMET    Component Value Date/Time   NA 142 04/26/2018 0902   K 4.6 04/26/2018 0902   CL 105 04/26/2018 0902   CO2 25 04/26/2018 0902   GLUCOSE 89 04/26/2018 0902   GLUCOSE 85 12/07/2016 1531   BUN 20 04/26/2018 0902   CREATININE 1.02 04/26/2018 0902   CREATININE 0.92  04/04/2015 1113   CALCIUM 9.3 04/26/2018 0902   GFRNONAA 72 04/26/2018 0902   GFRAA 83 04/26/2018 0902   Lab Results  Component Value Date   HGBA1C 5.7 (H) 04/26/2018   HGBA1C 5.8 (H) 12/22/2017   HGBA1C 5.8 (H) 08/18/2017   Lab Results  Component Value Date   INSULIN 6.4 04/26/2018   INSULIN 12.1 12/22/2017   INSULIN 17.6 08/18/2017   CBC    Component Value Date/Time   WBC 6.7 08/18/2017 1059   WBC 8.4 12/07/2016 1531   RBC 4.88 08/18/2017 1059   RBC 4.80 12/07/2016 1531   HGB 15.5 08/18/2017 1059   HCT 46.8 08/18/2017 1059   PLT 185.0 12/07/2016 1531   MCV 96 08/18/2017 1059  MCH 31.8 08/18/2017 1059   MCH 28.8 04/04/2015 1113   MCHC 33.1 08/18/2017 1059   MCHC 34.3 12/07/2016 1531   RDW 14.3 08/18/2017 1059   LYMPHSABS 1.7 08/18/2017 1059   MONOABS 0.7 09/13/2013 1350   EOSABS 0.1 08/18/2017 1059   BASOSABS 0.0 08/18/2017 1059   Iron/TIBC/Ferritin/ %Sat No results found for: IRON, TIBC, FERRITIN, IRONPCTSAT Lipid Panel     Component Value Date/Time   CHOL 131 04/26/2018 0902   TRIG 56 04/26/2018 0902   HDL 50 04/26/2018 0902   CHOLHDL 3 04/12/2016 1004   VLDL 21.0 04/12/2016 1004   LDLCALC 70 04/26/2018 0902   Hepatic Function Panel     Component Value Date/Time   PROT 6.6 04/26/2018 0902   ALBUMIN 4.1 04/26/2018 0902   AST 18 04/26/2018 0902   ALT 14 04/26/2018 0902   ALKPHOS 68 04/26/2018 0902   BILITOT 0.4 04/26/2018 0902   BILIDIR 0.1 02/11/2014 1016   IBILI 0.3 02/11/2014 1016      Component Value Date/Time   TSH 1.680 08/18/2017 1059   TSH 1.80 12/07/2016 1531   TSH 1.77 04/12/2016 1004    ASSESSMENT AND PLAN: Prediabetes  Atrial fibrillation, unspecified type (HCC)  Class 1 obesity with serious comorbidity and body mass index (BMI) of 30.0 to 30.9 in adult, unspecified obesity type  PLAN:  Pre-Diabetes [name] [name] will continue to work on weight loss, diet, exercise, and decreasing simple carbohydrates in his diet to help decrease  the risk of diabetes. We dicussed metformin including benefits and risks. He was informed that eating too many simple carbohydrates or too many calories at one sitting increases the likelihood of GI side effects. [name] [name] declined metformin for now and a prescription was not written today. We will recheck labs in 2 months and [name] [name] agrees to follow up with our clinic in 3 weeks as directed to monitor his progress.  Atrial Fibrillation [name] [name] agrees to continue taking Eliquis and he agrees to follow up with our clinic in 3 weeks.  Obesity [name] [name] is currently in the action stage of change. As such, his goal is to continue with weight loss efforts He has agreed to keep a food journal with 1500-1800 calories and 100+ grams of protein daily [name] [name] has been instructed to work up to a goal of 150 minutes of combined cardio and strengthening exercise per week for weight loss and overall health benefits. We discussed the following Behavioral Modification Strategies today: work on meal planning and easy cooking plans and keep a strict food journal   [name] [name] has agreed to follow up with our clinic in 3 weeks. He was informed of the importance of frequent follow up visits to maximize his success with intensive lifestyle modifications for his multiple health conditions.   OBESITY BEHAVIORAL INTERVENTION VISIT  Today's visit was # 17 out of 22.  Starting weight: 237 lbs Starting date: 08/18/17 Today's weight : 209 lbs Today's date: 06/26/2018 Total lbs lost to date: 25 (Patients must lose 7 lbs in the first 6 months to continue with counseling)   ASK: We discussed the diagnosis of obesity with [name] [name] today and [name] [name] agreed to give [name] permission to discuss obesity behavioral modification therapy today.  ASSESS: [name] [name] has the diagnosis of obesity and his BMI today is 29.99 [name] [name] is in the action stage of change   ADVISE: [name] [name] was educated on the multiple health risks of obesity as well as the  benefit of weight loss to improve his health.  He was advised of the need for long term treatment and the importance of lifestyle modifications.  AGREE: Multiple dietary modification options and treatment options were discussed and  [name] [name] agreed to the above obesity treatment plan.  I, Trixie Dredge, am acting as transcriptionist for Dennard Nip, MD  I have reviewed the above documentation for accuracy and completeness, and I agree with the above. -Dennard Nip, MD

## 2018-07-06 ENCOUNTER — Ambulatory Visit: Payer: Medicare Other | Admitting: Family Medicine

## 2018-07-06 ENCOUNTER — Encounter: Payer: Self-pay | Admitting: Family Medicine

## 2018-07-17 ENCOUNTER — Ambulatory Visit (INDEPENDENT_AMBULATORY_CARE_PROVIDER_SITE_OTHER): Payer: Medicare Other | Admitting: Family Medicine

## 2018-07-17 VITALS — BP 113/79 | HR 45 | Ht 70.0 in | Wt 210.0 lb

## 2018-07-17 DIAGNOSIS — E559 Vitamin D deficiency, unspecified: Secondary | ICD-10-CM

## 2018-07-17 DIAGNOSIS — E669 Obesity, unspecified: Secondary | ICD-10-CM | POA: Diagnosis not present

## 2018-07-17 DIAGNOSIS — Z6830 Body mass index (BMI) 30.0-30.9, adult: Secondary | ICD-10-CM

## 2018-07-17 NOTE — Progress Notes (Signed)
Office: [phone]  /  Fax: [phone]   HPI:   Chief Complaint: OBESITY [name] [name] is here to discuss his progress with his obesity treatment plan. He is on the keep a food journal with 1500-1800 calories and 100+ grams of protein daily and is following his eating plan approximately 70-75 % of the time. He states he is doing low impact aerobics for 60 minutes 1 times per week. [name] [name] is retaining fluid, has increased traveling and likely increasing Na+. He is deviating from his plan more but ready to get back on track.  His weight is 210 lb (95.3 kg) today and has gained 1 pound since his last visit. He has lost 27 lbs since starting treatment with [name].  Vitamin D Deficiency [name] [name] has a diagnosis of vitamin D deficiency. He is stable on OTC vit D, last level at goal. He denies nausea, vomiting or muscle weakness.  ALLERGIES: No Known Allergies  MEDICATIONS: Current Outpatient Medications on File Prior to Visit  Medication Sig Dispense Refill  . apixaban (ELIQUIS) 5 MG TABS tablet Take 1 tablet (5 mg total) by mouth 2 (two) times daily. 180 tablet 3  . Cholecalciferol (VITAMIN D3) 5000 units CAPS Take 1 capsule by mouth daily.    Marland Kitchen gabapentin (NEURONTIN) 300 MG capsule TAKE 3 CAPSULES BY MOUTH THREE TIMES DAILY 270 capsule 3  . Multiple Vitamin (MULTIVITAMIN WITH MINERALS) TABS tablet Take 1 tablet by mouth daily.     No current facility-administered medications on file prior to visit.     PAST MEDICAL HISTORY: Past Medical History:  Diagnosis Date  . BPH (benign prostatic hypertrophy)   . Bradycardia 12/30/2014   HR routinely in mid 40s-50s  . Coronary artery disease (CAD) excluded April 2016   False-positive nuclear stress test suggesting inferior ischemia  . Elevated PSA   . Encounter for Medicare annual wellness exam 09/22/2013   Sees Dr Wilhemina Bonito of Derm Sees Dr Silvano Rusk of Gastroenterology Sees Dr Kathie Rhodes of [facility] Sees Dr Susa Day of Optometry  .  Finger pain, left 04/12/2016   4th   . GERD (gastroesophageal reflux disease)    controlled w/ diet and behavioral changes  . Hypertriglyceridemia 03/16/2015  . Lymphadenitis   . Melanoma of back ([place])    Excised Dr Wilhemina Bonito  . New onset a-fib ([place]) 03/10/2015  . Otitis, externa, infective 08/10/2015  . Palpitations   . Post herpetic neuralgia   . Shingles 07/09/2013  . Sleep apnea 07/30/2011   cpap- 13   . Snoring disorder 07/30/2011  . White coat hypertension     PAST SURGICAL HISTORY: Past Surgical History:  Procedure Laterality Date  . CATARACT EXTRACTION Right   . EYE SURGERY Right 12/06/2017   cataract by Dr Gillian Scarce  . LEFT HEART CATHETERIZATION WITH CORONARY ANGIOGRAM N/A 04/10/2015   Procedure: LEFT HEART CATHETERIZATION WITH  CORONARY ANGIOGRAM;  Surgeon: Leonie Man, MD;  Location: [facility] CATH LAB;  Service: Cardiovascular;  Angiographicallly NORMAL CORONARY ARTERIES  . MASS EXCISION N/A 05/16/2014   Procedure: EXCISION POSTERIOR NECK MASS, RIGHT CHEST WALL MASS AND RIGHT AXILLARY MASS AXILLARY NODE DISSECTION;  Surgeon: Adin Hector, MD;  Location: [facility] ORS;  Service: General;  Laterality: N/A;  . NM MYOVIEW LTD  03/19/2015   FALSE POSITIVE:  INTERMEDIATE RISK. Small sized, moderate intensity inferior ischemic perfusion defect  . TRANSTHORACIC ECHOCARDIOGRAM  03/20/2015   Normal LV size and function. EF 60-65%. G1 DD. Trivial AI. Borderline aortic root dilation (  41 mm), Mild LA dilation.    SOCIAL HISTORY: Social History   Tobacco Use  . Smoking status: Former Smoker    Types: Pipe, Cigarettes    Last attempt to quit: 02/11/1989    Years since quitting: 29.4  . Smokeless tobacco: Never Used  Substance Use Topics  . Alcohol use: Yes    Alcohol/week: 4.2 oz    Types: 7 Glasses of wine per week    Comment: 1 per day -- brandy or wine  . Drug use: No    FAMILY HISTORY: Family History  Problem Relation Age of Onset  . Cancer Mother        MM, leukemia  . Obesity  Mother   . Emphysema Father   . Obesity Father   . Alcohol abuse Father   . Cancer Sister        brain  . COPD Brother   . Heart disease Paternal Grandmother   . COPD Paternal Grandfather   . Diabetes Sister   . Obesity Sister   . Down syndrome Brother   . Alcohol abuse Other   . Colon cancer Neg Hx     ROS: Review of Systems  Constitutional: Negative for weight loss.  Gastrointestinal: Negative for nausea and vomiting.  Musculoskeletal:       Negative muscle weakness    PHYSICAL EXAM: Blood pressure 113/79, pulse (!) 45, height 5\' 10"  (1.778 m), weight 210 lb (95.3 kg), SpO2 92 %. Body mass index is 30.13 kg/m. Physical Exam  Constitutional: He is oriented to person, place, and time. He appears well-developed and well-nourished.  Cardiovascular: Normal rate.  Pulmonary/Chest: Effort normal.  Musculoskeletal: Normal range of motion.  Neurological: He is oriented to person, place, and time.  Skin: Skin is warm and dry.  Psychiatric: He has a normal mood and affect. His behavior is normal.  Vitals reviewed.   RECENT LABS AND TESTS: BMET    Component Value Date/Time   NA 142 04/26/2018 0902   K 4.6 04/26/2018 0902   CL 105 04/26/2018 0902   CO2 25 04/26/2018 0902   GLUCOSE 89 04/26/2018 0902   GLUCOSE 85 12/07/2016 1531   BUN 20 04/26/2018 0902   CREATININE 1.02 04/26/2018 0902   CREATININE 0.92 04/04/2015 1113   CALCIUM 9.3 04/26/2018 0902   GFRNONAA 72 04/26/2018 0902   GFRAA 83 04/26/2018 0902   Lab Results  Component Value Date   HGBA1C 5.7 (H) 04/26/2018   HGBA1C 5.8 (H) 12/22/2017   HGBA1C 5.8 (H) 08/18/2017   Lab Results  Component Value Date   INSULIN 6.4 04/26/2018   INSULIN 12.1 12/22/2017   INSULIN 17.6 08/18/2017   CBC    Component Value Date/Time   WBC 6.7 08/18/2017 1059   WBC 8.4 12/07/2016 1531   RBC 4.88 08/18/2017 1059   RBC 4.80 12/07/2016 1531   HGB 15.5 08/18/2017 1059   HCT 46.8 08/18/2017 1059   PLT 185.0 12/07/2016 1531    MCV 96 08/18/2017 1059   MCH 31.8 08/18/2017 1059   MCH 28.8 04/04/2015 1113   MCHC 33.1 08/18/2017 1059   MCHC 34.3 12/07/2016 1531   RDW 14.3 08/18/2017 1059   LYMPHSABS 1.7 08/18/2017 1059   MONOABS 0.7 09/13/2013 1350   EOSABS 0.1 08/18/2017 1059   BASOSABS 0.0 08/18/2017 1059   Iron/TIBC/Ferritin/ %Sat No results found for: IRON, TIBC, FERRITIN, IRONPCTSAT Lipid Panel     Component Value Date/Time   CHOL 131 04/26/2018 0902   TRIG 56 04/26/2018  0902   HDL 50 04/26/2018 0902   CHOLHDL 3 04/12/2016 1004   VLDL 21.0 04/12/2016 1004   LDLCALC 70 04/26/2018 0902   Hepatic Function Panel     Component Value Date/Time   PROT 6.6 04/26/2018 0902   ALBUMIN 4.1 04/26/2018 0902   AST 18 04/26/2018 0902   ALT 14 04/26/2018 0902   ALKPHOS 68 04/26/2018 0902   BILITOT 0.4 04/26/2018 0902   BILIDIR 0.1 02/11/2014 1016   IBILI 0.3 02/11/2014 1016      Component Value Date/Time   TSH 1.680 08/18/2017 1059   TSH 1.80 12/07/2016 1531   TSH 1.77 04/12/2016 1004  Results for Sendejo, [name] ALDEN "MIKE" (MRN [id number]) as of 07/17/2018 11:01  Ref. Range 04/26/2018 09:02  Vitamin D, 25-Hydroxy Latest Ref Range: 30.0 - 100.0 ng/mL 68.8    ASSESSMENT AND PLAN: Vitamin D deficiency  Class 1 obesity with serious comorbidity and body mass index (BMI) of 30.0 to 30.9 in adult, unspecified obesity type  PLAN:  Vitamin D Deficiency [name] [name] was informed that low vitamin D levels contributes to fatigue and are associated with obesity, breast, and colon cancer. Galo agrees to continue taking OTC Vit D 5,000 IU daily and will follow up for routine testing of vitamin D, at least 2-3 times per year. He was informed of the risk of over-replacement of vitamin D and agrees to not increase his dose unless he discusses this with [name] first. We will recheck labs in 1 month and Judson agrees to follow up with our clinic in 3 weeks.  We spent > than 50% of the 15 minute visit on the counseling as  documented in the note.  Obesity Seydou is currently in the action stage of change. As such, his goal is to continue with weight loss efforts He has agreed to follow the Category 3 plan or follow the Pescatarian eating plan + 300 calories Malaquias has been instructed to work up to a goal of 150 minutes of combined cardio and strengthening exercise per week for weight loss and overall health benefits. We discussed the following Behavioral Modification Strategies today: increase H20 intake   Nicholi has agreed to follow up with our clinic in 3 weeks. He was informed of the importance of frequent follow up visits to maximize his success with intensive lifestyle modifications for his multiple health conditions.   OBESITY BEHAVIORAL INTERVENTION VISIT  Today's visit was # 18 out of 22.  Starting weight: 237 lbs Starting date: 08/18/17 Today's weight : 210 lbs  Today's date: 07/17/2018 Total lbs lost to date: 43    ASK: We discussed the diagnosis of obesity with [name] today and Ayven agreed to give [name] permission to discuss obesity behavioral modification therapy today.  ASSESS: Laurice has the diagnosis of obesity and his BMI today is 30.13 Domnick is in the action stage of change   ADVISE: Laquentin was educated on the multiple health risks of obesity as well as the benefit of weight loss to improve his health. He was advised of the need for long term treatment and the importance of lifestyle modifications.  AGREE: Multiple dietary modification options and treatment options were discussed and  Deron agreed to the above obesity treatment plan.  I, Trixie Dredge, am acting as transcriptionist for Dennard Nip, MD  I have reviewed the above documentation for accuracy and completeness, and I agree with the above. -Dennard Nip, MD

## 2018-08-07 ENCOUNTER — Ambulatory Visit (INDEPENDENT_AMBULATORY_CARE_PROVIDER_SITE_OTHER): Payer: Medicare Other | Admitting: Family Medicine

## 2018-08-07 VITALS — BP 115/80 | HR 70 | Temp 97.8°F | Ht 70.0 in | Wt 207.0 lb

## 2018-08-07 DIAGNOSIS — E559 Vitamin D deficiency, unspecified: Secondary | ICD-10-CM

## 2018-08-07 DIAGNOSIS — E66811 Obesity, class 1: Secondary | ICD-10-CM

## 2018-08-07 DIAGNOSIS — E669 Obesity, unspecified: Secondary | ICD-10-CM

## 2018-08-07 DIAGNOSIS — Z6830 Body mass index (BMI) 30.0-30.9, adult: Secondary | ICD-10-CM | POA: Diagnosis not present

## 2018-08-07 DIAGNOSIS — R972 Elevated prostate specific antigen [PSA]: Secondary | ICD-10-CM | POA: Diagnosis not present

## 2018-08-07 NOTE — Progress Notes (Signed)
Office: [phone]  /  Fax: [phone]   HPI:   Chief Complaint: OBESITY [name] [name] is here to discuss his progress with his obesity treatment plan. He is on the Category 3 plan or follow the Pescatarian eating plan + 300 calories and is following his eating plan approximately 80 % of the time. He states he is exercising 0 minutes 0 times per week. Attikus continues to do well with weight loss on his Category 3 plan, notes he does better when his wife travels.  His weight is 207 lb (93.9 kg) today and has had a weight loss of 2 pounds over a period of 3 weeks since his last visit. He has lost 30 lbs since starting treatment with [name].  Vitamin D Deficiency [name] [name] has a diagnosis of vitamin D deficiency. He is now at goal and is on OTC Vit D. He denies nausea, vomiting or muscle weakness.  ALLERGIES: No Known Allergies  MEDICATIONS: Current Outpatient Medications on File Prior to Visit  Medication Sig Dispense Refill  . apixaban (ELIQUIS) 5 MG TABS tablet Take 1 tablet (5 mg total) by mouth 2 (two) times daily. 180 tablet 3  . Cholecalciferol (VITAMIN D3) 5000 units CAPS Take 1 capsule by mouth daily.    Marland Kitchen gabapentin (NEURONTIN) 300 MG capsule TAKE 3 CAPSULES BY MOUTH THREE TIMES DAILY 270 capsule 3  . Multiple Vitamin (MULTIVITAMIN WITH MINERALS) TABS tablet Take 1 tablet by mouth daily.     No current facility-administered medications on file prior to visit.     PAST MEDICAL HISTORY: Past Medical History:  Diagnosis Date  . BPH (benign prostatic hypertrophy)   . Bradycardia 12/30/2014   HR routinely in mid 40s-50s  . Coronary artery disease (CAD) excluded April 2016   False-positive nuclear stress test suggesting inferior ischemia  . Elevated PSA   . Encounter for Medicare annual wellness exam 09/22/2013   Sees Dr Wilhemina Bonito of Derm Sees Dr Silvano Rusk of Gastroenterology Sees Dr Kathie Rhodes of [facility] Sees Dr Susa Day of Optometry  . Finger pain, left 04/12/2016   4th   . GERD (gastroesophageal reflux disease)    controlled w/ diet and behavioral changes  . Hypertriglyceridemia 03/16/2015  . Lymphadenitis   . Melanoma of back ([place])    Excised Dr Wilhemina Bonito  . New onset a-fib ([place]) 03/10/2015  . Otitis, externa, infective 08/10/2015  . Palpitations   . Post herpetic neuralgia   . Shingles 07/09/2013  . Sleep apnea 07/30/2011   cpap- 13   . Snoring disorder 07/30/2011  . White coat hypertension     PAST SURGICAL HISTORY: Past Surgical History:  Procedure Laterality Date  . CATARACT EXTRACTION Right   . EYE SURGERY Right 12/06/2017   cataract by Dr Gillian Scarce  . LEFT HEART CATHETERIZATION WITH CORONARY ANGIOGRAM N/A 04/10/2015   Procedure: LEFT HEART CATHETERIZATION WITH  CORONARY ANGIOGRAM;  Surgeon: Leonie Man, MD;  Location: [facility] CATH LAB;  Service: Cardiovascular;  Angiographicallly NORMAL CORONARY ARTERIES  . MASS EXCISION N/A 05/16/2014   Procedure: EXCISION POSTERIOR NECK MASS, RIGHT CHEST WALL MASS AND RIGHT AXILLARY MASS AXILLARY NODE DISSECTION;  Surgeon: Adin Hector, MD;  Location: [facility] ORS;  Service: General;  Laterality: N/A;  . NM MYOVIEW LTD  03/19/2015   FALSE POSITIVE:  INTERMEDIATE RISK. Small sized, moderate intensity inferior ischemic perfusion defect  . TRANSTHORACIC ECHOCARDIOGRAM  03/20/2015   Normal LV size and function. EF 60-65%. G1 DD. Trivial AI. Borderline aortic root dilation (  41 mm), Mild LA dilation.    SOCIAL HISTORY: Social History   Tobacco Use  . Smoking status: Former Smoker    Types: Pipe, Cigarettes    Last attempt to quit: 02/11/1989    Years since quitting: 29.5  . Smokeless tobacco: Never Used  Substance Use Topics  . Alcohol use: Yes    Alcohol/week: 7.0 standard drinks    Types: 7 Glasses of wine per week    Comment: 1 per day -- brandy or wine  . Drug use: No    FAMILY HISTORY: Family History  Problem Relation Age of Onset  . Cancer Mother        MM, leukemia  . Obesity Mother   . Emphysema  Father   . Obesity Father   . Alcohol abuse Father   . Cancer Sister        brain  . COPD Brother   . Heart disease Paternal Grandmother   . COPD Paternal Grandfather   . Diabetes Sister   . Obesity Sister   . Down syndrome Brother   . Alcohol abuse Other   . Colon cancer Neg Hx     ROS: Review of Systems  Constitutional: Positive for weight loss.  Gastrointestinal: Negative for nausea and vomiting.  Musculoskeletal:       Negative muscle weakness    PHYSICAL EXAM: Blood pressure 115/80, pulse 70, temperature 97.8 F (36.6 C), temperature source Oral, height 5\' 10"  (1.778 m), weight 207 lb (93.9 kg), SpO2 99 %. Body mass index is 29.7 kg/m. Physical Exam  Constitutional: He is oriented to person, place, and time. He appears well-developed and well-nourished.  Cardiovascular: Normal rate.  Pulmonary/Chest: Effort normal.  Musculoskeletal: Normal range of motion.  Neurological: He is oriented to person, place, and time.  Skin: Skin is warm and dry.  Psychiatric: He has a normal mood and affect. His behavior is normal.  Vitals reviewed.   RECENT LABS AND TESTS: BMET    Component Value Date/Time   NA 142 04/26/2018 0902   K 4.6 04/26/2018 0902   CL 105 04/26/2018 0902   CO2 25 04/26/2018 0902   GLUCOSE 89 04/26/2018 0902   GLUCOSE 85 12/07/2016 1531   BUN 20 04/26/2018 0902   CREATININE 1.02 04/26/2018 0902   CREATININE 0.92 04/04/2015 1113   CALCIUM 9.3 04/26/2018 0902   GFRNONAA 72 04/26/2018 0902   GFRAA 83 04/26/2018 0902   Lab Results  Component Value Date   HGBA1C 5.7 (H) 04/26/2018   HGBA1C 5.8 (H) 12/22/2017   HGBA1C 5.8 (H) 08/18/2017   Lab Results  Component Value Date   INSULIN 6.4 04/26/2018   INSULIN 12.1 12/22/2017   INSULIN 17.6 08/18/2017   CBC    Component Value Date/Time   WBC 6.7 08/18/2017 1059   WBC 8.4 12/07/2016 1531   RBC 4.88 08/18/2017 1059   RBC 4.80 12/07/2016 1531   HGB 15.5 08/18/2017 1059   HCT 46.8 08/18/2017 1059    PLT 185.0 12/07/2016 1531   MCV 96 08/18/2017 1059   MCH 31.8 08/18/2017 1059   MCH 28.8 04/04/2015 1113   MCHC 33.1 08/18/2017 1059   MCHC 34.3 12/07/2016 1531   RDW 14.3 08/18/2017 1059   LYMPHSABS 1.7 08/18/2017 1059   MONOABS 0.7 09/13/2013 1350   EOSABS 0.1 08/18/2017 1059   BASOSABS 0.0 08/18/2017 1059   Iron/TIBC/Ferritin/ %Sat No results found for: IRON, TIBC, FERRITIN, IRONPCTSAT Lipid Panel     Component Value Date/Time   CHOL  131 04/26/2018 0902   TRIG 56 04/26/2018 0902   HDL 50 04/26/2018 0902   CHOLHDL 3 04/12/2016 1004   VLDL 21.0 04/12/2016 1004   LDLCALC 70 04/26/2018 0902   Hepatic Function Panel     Component Value Date/Time   PROT 6.6 04/26/2018 0902   ALBUMIN 4.1 04/26/2018 0902   AST 18 04/26/2018 0902   ALT 14 04/26/2018 0902   ALKPHOS 68 04/26/2018 0902   BILITOT 0.4 04/26/2018 0902   BILIDIR 0.1 02/11/2014 1016   IBILI 0.3 02/11/2014 1016      Component Value Date/Time   TSH 1.680 08/18/2017 1059   TSH 1.80 12/07/2016 1531   TSH 1.77 04/12/2016 1004  Results for Patti, Linda ALDEN "MIKE" (MRN [id number]) as of 08/07/2018 13:26  Ref. Range 04/26/2018 09:02  Vitamin D, 25-Hydroxy Latest Ref Range: 30.0 - 100.0 ng/mL 68.8    ASSESSMENT AND PLAN: Vitamin D deficiency  Class 1 obesity with serious comorbidity and body mass index (BMI) of 30.0 to 30.9 in adult, unspecified obesity type - Starting BMI greater then 30  PLAN:  Vitamin D Deficiency Dejohn was informed that low vitamin D levels contributes to fatigue and are associated with obesity, breast, and colon cancer. Koen agrees to continue taking OTC Vit D 5,000 IU daily and we will recheck labs at next visit. He will follow up for routine testing of vitamin D, at least 2-3 times per year. He was informed of the risk of over-replacement of vitamin D and agrees to not increase his dose unless he discusses this with [name] first. Breylon agrees to follow up with our clinic in 3  weeks.  Obesity Maan is currently in the action stage of change. As such, his goal is to continue with weight loss efforts He has agreed to follow the Category 3 plan Jerrick has been instructed to work up to a goal of 150 minutes of combined cardio and strengthening exercise per week for weight loss and overall health benefits. Core strengthening exercises given. We discussed the following Behavioral Modification Strategies today: increasing lean protein intake, work on meal planning and easy cooking plans and dealing with family or coworker sabotage   Zachery has agreed to follow up with our clinic in 3 weeks. He was informed of the importance of frequent follow up visits to maximize his success with intensive lifestyle modifications for his multiple health conditions.   OBESITY BEHAVIORAL INTERVENTION VISIT  Today's visit was # 19 out of 22.  Starting weight: 237 lbs Starting date: 08/18/17 Today's weight : 207 lbs Today's date: 08/07/2018 Total lbs lost to date: 64    ASK: We discussed the diagnosis of obesity with [name] today and Balraj agreed to give [name] permission to discuss obesity behavioral modification therapy today.  ASSESS: Eagle has the diagnosis of obesity and his BMI today is 29.7 [name] is in the action stage of change   ADVISE: Ruel was educated on the multiple health risks of obesity as well as the benefit of weight loss to improve his health. He was advised of the need for long term treatment and the importance of lifestyle modifications.  AGREE: Multiple dietary modification options and treatment options were discussed and  Spyros agreed to the above obesity treatment plan.  I, Trixie Dredge, am acting as transcriptionist for Dennard Nip, MD  I have reviewed the above documentation for accuracy and completeness, and I agree with the above. -Dennard Nip, MD

## 2018-08-14 DIAGNOSIS — N401 Enlarged prostate with lower urinary tract symptoms: Secondary | ICD-10-CM | POA: Diagnosis not present

## 2018-08-14 DIAGNOSIS — R972 Elevated prostate specific antigen [PSA]: Secondary | ICD-10-CM | POA: Diagnosis not present

## 2018-08-14 DIAGNOSIS — N3943 Post-void dribbling: Secondary | ICD-10-CM | POA: Diagnosis not present

## 2018-08-20 ENCOUNTER — Other Ambulatory Visit: Payer: Self-pay | Admitting: Family Medicine

## 2018-08-23 ENCOUNTER — Encounter: Payer: Self-pay | Admitting: Family Medicine

## 2018-08-29 ENCOUNTER — Ambulatory Visit (INDEPENDENT_AMBULATORY_CARE_PROVIDER_SITE_OTHER): Payer: Medicare Other | Admitting: Family Medicine

## 2018-08-29 VITALS — BP 114/66 | HR 62 | Temp 97.5°F | Ht 69.0 in | Wt 210.0 lb

## 2018-08-29 DIAGNOSIS — E559 Vitamin D deficiency, unspecified: Secondary | ICD-10-CM

## 2018-08-29 DIAGNOSIS — E669 Obesity, unspecified: Secondary | ICD-10-CM | POA: Diagnosis not present

## 2018-08-29 DIAGNOSIS — Z6831 Body mass index (BMI) 31.0-31.9, adult: Secondary | ICD-10-CM

## 2018-08-29 DIAGNOSIS — R7303 Prediabetes: Secondary | ICD-10-CM | POA: Diagnosis not present

## 2018-08-30 LAB — COMPREHENSIVE METABOLIC PANEL
A/G RATIO: 1.6 (ref 1.2–2.2)
ALBUMIN: 4.1 g/dL (ref 3.5–4.8)
ALK PHOS: 57 IU/L (ref 39–117)
ALT: 19 IU/L (ref 0–44)
AST: 20 IU/L (ref 0–40)
BUN/Creatinine Ratio: 19 (ref 10–24)
BUN: 19 mg/dL (ref 8–27)
Bilirubin Total: 0.9 mg/dL (ref 0.0–1.2)
CO2: 24 mmol/L (ref 20–29)
CREATININE: 0.99 mg/dL (ref 0.76–1.27)
Calcium: 9.3 mg/dL (ref 8.6–10.2)
Chloride: 104 mmol/L (ref 96–106)
GFR, EST AFRICAN AMERICAN: 86 mL/min/{1.73_m2} (ref >59)
GFR, EST NON AFRICAN AMERICAN: 74 mL/min/{1.73_m2} (ref >59)
GLUCOSE: 88 mg/dL (ref 65–99)
Globulin, Total: 2.5 g/dL (ref 1.5–4.5)
Potassium: 4.7 mmol/L (ref 3.5–5.2)
SODIUM: 143 mmol/L (ref 134–144)
Total Protein: 6.6 g/dL (ref 6.0–8.5)

## 2018-08-30 LAB — VITAMIN D 25 HYDROXY (VIT D DEFICIENCY, FRACTURES): VIT D 25 HYDROXY: 61 ng/mL (ref 30.0–100.0)

## 2018-08-30 LAB — INSULIN, RANDOM: INSULIN: 5.6 u[IU]/mL (ref 2.6–24.9)

## 2018-08-30 LAB — HEMOGLOBIN A1C
Est. average glucose Bld gHb Est-mCnc: 114 mg/dL
Hgb A1c MFr Bld: 5.6 % (ref 4.8–5.6)

## 2018-08-30 NOTE — Progress Notes (Signed)
Office: [phone]  /  Fax: [phone]   HPI:   Chief Complaint: OBESITY [name] [name] is here to discuss his progress with his obesity treatment plan. He is on the Category 3 plan and is following his eating plan approximately 75 % of the time. He states he is always busy. [name] [name] has gained weight slightly, but he is getting ready to get back to a structured plan. He has increased eating out at lunch, but he is trying to be mindful. His weight is 210 lb (95.3 kg) today and has not lost weight since his last visit. He has lost 27 lbs since starting treatment with [name].  Pre-Diabetes with hyperglycemia [name] [name] has a diagnosis of prediabetes based on his elevated Hgb A1c and was informed this puts him at greater risk of developing diabetes. He is stable on diet and is doing well with decreasing simple carbohydrates overall. He continues to work on diet and exercise to decrease risk of diabetes. He denies hypoglycemia.  Vitamin D deficiency [name] [name] has a diagnosis of vitamin D deficiency. He is stable on vit D and denies nausea, vomiting or muscle weakness.  ALLERGIES: No Known Allergies  MEDICATIONS: Current Outpatient Medications on File Prior to Visit  Medication Sig Dispense Refill  . apixaban (ELIQUIS) 5 MG TABS tablet Take 1 tablet (5 mg total) by mouth 2 (two) times daily. 180 tablet 3  . Cholecalciferol (VITAMIN D3) 5000 units CAPS Take 1 capsule by mouth daily.    Marland Kitchen gabapentin (NEURONTIN) 300 MG capsule TAKE 3 CAPSULES BY MOUTH THREE TIMES DAILY 270 capsule 3  . Multiple Vitamin (MULTIVITAMIN WITH MINERALS) TABS tablet Take 1 tablet by mouth daily.     No current facility-administered medications on file prior to visit.     PAST MEDICAL HISTORY: Past Medical History:  Diagnosis Date  . BPH (benign prostatic hypertrophy)   . Bradycardia 12/30/2014   HR routinely in mid 40s-50s  . Coronary artery disease (CAD) excluded April 2016   False-positive nuclear stress test suggesting  inferior ischemia  . Elevated PSA   . Encounter for Medicare annual wellness exam 09/22/2013   Sees Dr Wilhemina Bonito of Derm Sees Dr Silvano Rusk of Gastroenterology Sees Dr Kathie Rhodes of [facility] Sees Dr Susa Day of Optometry  . Finger pain, left 04/12/2016   4th   . GERD (gastroesophageal reflux disease)    controlled w/ diet and behavioral changes  . Hypertriglyceridemia 03/16/2015  . Lymphadenitis   . Melanoma of back ([place])    Excised Dr Wilhemina Bonito  . New onset a-fib ([place]) 03/10/2015  . Otitis, externa, infective 08/10/2015  . Palpitations   . Post herpetic neuralgia   . Shingles 07/09/2013  . Sleep apnea 07/30/2011   cpap- 13   . Snoring disorder 07/30/2011  . White coat hypertension     PAST SURGICAL HISTORY: Past Surgical History:  Procedure Laterality Date  . CATARACT EXTRACTION Right   . EYE SURGERY Right 12/06/2017   cataract by Dr Gillian Scarce  . LEFT HEART CATHETERIZATION WITH CORONARY ANGIOGRAM N/A 04/10/2015   Procedure: LEFT HEART CATHETERIZATION WITH  CORONARY ANGIOGRAM;  Surgeon: Leonie Man, MD;  Location: [facility] CATH LAB;  Service: Cardiovascular;  Angiographicallly NORMAL CORONARY ARTERIES  . MASS EXCISION N/A 05/16/2014   Procedure: EXCISION POSTERIOR NECK MASS, RIGHT CHEST WALL MASS AND RIGHT AXILLARY MASS AXILLARY NODE DISSECTION;  Surgeon: Adin Hector, MD;  Location: [facility] ORS;  Service: General;  Laterality: N/A;  . NM MYOVIEW LTD  03/19/2015   FALSE POSITIVE:  INTERMEDIATE RISK. Small sized, moderate intensity inferior ischemic perfusion defect  . TRANSTHORACIC ECHOCARDIOGRAM  03/20/2015   Normal LV size and function. EF 60-65%. G1 DD. Trivial AI. Borderline aortic root dilation (41 mm), Mild LA dilation.    SOCIAL HISTORY: Social History   Tobacco Use  . Smoking status: Former Smoker    Types: Pipe, Cigarettes    Last attempt to quit: 02/11/1989    Years since quitting: 29.5  . Smokeless tobacco: Never Used  Substance Use Topics  . Alcohol use:  Yes    Alcohol/week: 7.0 standard drinks    Types: 7 Glasses of wine per week    Comment: 1 per day -- brandy or wine  . Drug use: No    FAMILY HISTORY: Family History  Problem Relation Age of Onset  . Cancer Mother        MM, leukemia  . Obesity Mother   . Emphysema Father   . Obesity Father   . Alcohol abuse Father   . Cancer Sister        brain  . COPD Brother   . Heart disease Paternal Grandmother   . COPD Paternal Grandfather   . Diabetes Sister   . Obesity Sister   . Down syndrome Brother   . Alcohol abuse Other   . [name] [name] cancer Neg Hx     ROS: Review of Systems  Constitutional: Negative for weight loss.  Gastrointestinal: Negative for nausea and vomiting.  Musculoskeletal:       Negative for muscle weakness  Endo/Heme/Allergies:       Positive for hyperglycemia Negative for hypoglycemia    PHYSICAL EXAM: Blood pressure 114/66, pulse 62, temperature (!) 97.5 F (36.4 C), temperature source Oral, height 5\' 9"  (1.753 m), weight 210 lb (95.3 kg), SpO2 99 %. Body mass index is 31.01 kg/m. Physical Exam  Constitutional: He is oriented to person, place, and time. He appears well-developed and well-nourished.  Cardiovascular: Normal rate.  Pulmonary/Chest: Effort normal.  Musculoskeletal: Normal range of motion.  Neurological: He is oriented to person, place, and time.  Skin: Skin is warm and dry.  Psychiatric: He has a normal mood and affect. His behavior is normal.  Vitals reviewed.   RECENT LABS AND TESTS: BMET    Component Value Date/Time   NA 143 08/29/2018 1143   K 4.7 08/29/2018 1143   CL 104 08/29/2018 1143   CO2 24 08/29/2018 1143   GLUCOSE 88 08/29/2018 1143   GLUCOSE 85 12/07/2016 1531   BUN 19 08/29/2018 1143   CREATININE 0.99 08/29/2018 1143   CREATININE 0.92 04/04/2015 1113   CALCIUM 9.3 08/29/2018 1143   GFRNONAA 74 08/29/2018 1143   GFRAA 86 08/29/2018 1143   Lab Results  Component Value Date   HGBA1C 5.6 08/29/2018   HGBA1C  5.7 (H) 04/26/2018   HGBA1C 5.8 (H) 12/22/2017   HGBA1C 5.8 (H) 08/18/2017   Lab Results  Component Value Date   INSULIN 5.6 08/29/2018   INSULIN 6.4 04/26/2018   INSULIN 12.1 12/22/2017   INSULIN 17.6 08/18/2017   CBC    Component Value Date/Time   WBC 6.7 08/18/2017 1059   WBC 8.4 12/07/2016 1531   RBC 4.88 08/18/2017 1059   RBC 4.80 12/07/2016 1531   HGB 15.5 08/18/2017 1059   HCT 46.8 08/18/2017 1059   PLT 185.0 12/07/2016 1531   MCV 96 08/18/2017 1059   MCH 31.8 08/18/2017 1059   MCH 28.8 04/04/2015 1113  MCHC 33.1 08/18/2017 1059   MCHC 34.3 12/07/2016 1531   RDW 14.3 08/18/2017 1059   LYMPHSABS 1.7 08/18/2017 1059   MONOABS 0.7 09/13/2013 1350   EOSABS 0.1 08/18/2017 1059   BASOSABS 0.0 08/18/2017 1059   Iron/TIBC/Ferritin/ %Sat No results found for: IRON, TIBC, FERRITIN, IRONPCTSAT Lipid Panel     Component Value Date/Time   CHOL 131 04/26/2018 0902   TRIG 56 04/26/2018 0902   HDL 50 04/26/2018 0902   CHOLHDL 3 04/12/2016 1004   VLDL 21.0 04/12/2016 1004   LDLCALC 70 04/26/2018 0902   Hepatic Function Panel     Component Value Date/Time   PROT 6.6 08/29/2018 1143   ALBUMIN 4.1 08/29/2018 1143   AST 20 08/29/2018 1143   ALT 19 08/29/2018 1143   ALKPHOS 57 08/29/2018 1143   BILITOT 0.9 08/29/2018 1143   BILIDIR 0.1 02/11/2014 1016   IBILI 0.3 02/11/2014 1016      Component Value Date/Time   TSH 1.680 08/18/2017 1059   TSH 1.80 12/07/2016 1531   TSH 1.77 04/12/2016 1004   Results for Gaultney, Zaxton ALDEN "MIKE" (MRN [id number]) as of 08/30/2018 11:52  Ref. Range 08/29/2018 11:43  Vitamin D, 25-Hydroxy Latest Ref Range: 30.0 - 100.0 ng/mL 61.0   ASSESSMENT AND PLAN: Prediabetes - Plan: Comprehensive metabolic panel, Hemoglobin A1c, Insulin, random  Vitamin D deficiency - Plan: VITAMIN D 25 Hydroxy (Vit-D Deficiency, Fractures)  Class 1 obesity with serious comorbidity and body mass index (BMI) of 31.0 to 31.9 in adult, unspecified obesity  type  PLAN:  Pre-Diabetes with hyperglycemia [name] [name] will continue to work on weight loss, exercise, and decreasing simple carbohydrates in his diet to help decrease the risk of diabetes. He was informed that eating too many simple carbohydrates or too many calories at one sitting increases the likelihood of GI side effects. We will recheck labs in 3 months and [name] [name] agreed to follow up with [name] as directed to monitor his progress.  Vitamin D Deficiency [name] [name] was informed that low vitamin D levels contributes to fatigue and are associated with obesity, breast, and [name] [name] cancer. He agrees to continue to take prescription Vit D @50 ,000 IU every week and will follow up for routine testing of vitamin D, at least 2-3 times per year. He was informed of the risk of over-replacement of vitamin D and agrees to not increase his dose unless he discusses this with [name] first. We will recheck labs and follow up with her results at the next visit.  I spent > than 50% of the 25 minute visit on counseling as documented in the note.  Obesity [name] [name] is currently in the action stage of change. As such, his goal is to continue with weight loss efforts He has agreed to follow the Category 3 plan [name] [name] has been instructed to work up to a goal of 150 minutes of combined cardio and strengthening exercise per week for weight loss and overall health benefits. We discussed the following Behavioral Modification Strategies today: increasing lean protein intake and decrease eating out  [name] [name] has agreed to follow up with our clinic in 4 weeks. He was informed of the importance of frequent follow up visits to maximize his success with intensive lifestyle modifications for his multiple health conditions.   OBESITY BEHAVIORAL INTERVENTION VISIT  Today's visit was # 20   Starting weight: 237 lbs Starting date: 08/18/17 Today's weight : 210 lbs Today's date: 08/29/2018 Total lbs lost to date: 57   ASK: We discussed the  diagnosis of obesity with [name] [name] today and [name] [name] agreed to give [name] permission to discuss obesity behavioral modification therapy today.  ASSESS: [name] [name] has the diagnosis of obesity and his BMI today is 42 [name] [name] is in the action stage of change   ADVISE: [name] [name] was educated on the multiple health risks of obesity as well as the benefit of weight loss to improve his health. He was advised of the need for long term treatment and the importance of lifestyle modifications to improve his current health and to decrease his risk of future health problems.  AGREE: Multiple dietary modification options and treatment options were discussed and  [name] [name] agreed to follow the recommendations documented in the above note.  ARRANGE: [name] [name] was educated on the importance of frequent visits to treat obesity as outlined per CMS and USPSTF guidelines and agreed to schedule his next follow up appointment today.  I, Doreene Nest, am acting as transcriptionist for Dennard Nip, MD  I have reviewed the above documentation for accuracy and completeness, and I agree with the above. -Dennard Nip, MD

## 2018-09-26 ENCOUNTER — Ambulatory Visit (INDEPENDENT_AMBULATORY_CARE_PROVIDER_SITE_OTHER): Payer: Medicare Other | Admitting: Family Medicine

## 2018-09-26 VITALS — BP 104/62 | HR 85 | Temp 97.4°F | Ht 69.0 in | Wt 208.0 lb

## 2018-09-26 DIAGNOSIS — Z6830 Body mass index (BMI) 30.0-30.9, adult: Secondary | ICD-10-CM | POA: Diagnosis not present

## 2018-09-26 DIAGNOSIS — R7303 Prediabetes: Secondary | ICD-10-CM

## 2018-09-26 DIAGNOSIS — E559 Vitamin D deficiency, unspecified: Secondary | ICD-10-CM | POA: Diagnosis not present

## 2018-09-26 DIAGNOSIS — E669 Obesity, unspecified: Secondary | ICD-10-CM

## 2018-09-27 NOTE — Progress Notes (Signed)
Office: [phone]  /  Fax: [phone]   HPI:   Chief Complaint: OBESITY [name] [name] is here to discuss his progress with his obesity treatment plan. He is on the Category 3 plan and is following his eating plan approximately 80 % of the time. He states he is walking around the house and yard for 150+ minutes. [name] [name] continues to do well with weight loss on his Category 3 plan. He states his hunger is controlled and has increased activity with rehabbing his bathroom. He is getting good support from his wife.  His weight is 208 lb (94.3 kg) today and has had a weight loss of 2 pounds over a period of 3 weeks since his last visit. He has lost 29 lbs since starting treatment with [name].  Vitamin D Deficiency [name] [name] has a diagnosis of vitamin D deficiency. He is stable on OTC Vit D. He denies nausea, vomiting or muscle weakness and notes fatigue improved.  Pre-Diabetes [name] [name] has a diagnosis of pre-diabetes based on his elevated Hgb A1c and was informed this puts him at greater risk of developing diabetes. He has improved with diet and exercise, A1c decreased from 5.8 to 5.6 and insulin is now at goal. He notes polyphagia has improved. He is not taking metformin currently and continues to work on diet and exercise to decrease risk of diabetes. He denies nausea or hypoglycemia.  ALLERGIES: No Known Allergies  MEDICATIONS: Current Outpatient Medications on File Prior to Visit  Medication Sig Dispense Refill  . apixaban (ELIQUIS) 5 MG TABS tablet Take 1 tablet (5 mg total) by mouth 2 (two) times daily. 180 tablet 3  . Cholecalciferol (VITAMIN D3) 5000 units CAPS Take 1 capsule by mouth daily.    Marland Kitchen gabapentin (NEURONTIN) 300 MG capsule TAKE 3 CAPSULES BY MOUTH THREE TIMES DAILY 270 capsule 3  . Multiple Vitamin (MULTIVITAMIN WITH MINERALS) TABS tablet Take 1 tablet by mouth daily.     No current facility-administered medications on file prior to visit.     PAST MEDICAL HISTORY: Past Medical  History:  Diagnosis Date  . BPH (benign prostatic hypertrophy)   . Bradycardia 12/30/2014   HR routinely in mid 40s-50s  . Coronary artery disease (CAD) excluded April 2016   False-positive nuclear stress test suggesting inferior ischemia  . Elevated PSA   . Encounter for Medicare annual wellness exam 09/22/2013   Sees Dr Wilhemina Bonito of Derm Sees Dr Silvano Rusk of Gastroenterology Sees Dr Kathie Rhodes of [facility] Sees Dr Susa Day of Optometry  . Finger pain, left 04/12/2016   4th   . GERD (gastroesophageal reflux disease)    controlled w/ diet and behavioral changes  . Hypertriglyceridemia 03/16/2015  . Lymphadenitis   . Melanoma of back ([place])    Excised Dr Wilhemina Bonito  . New onset a-fib ([place]) 03/10/2015  . Otitis, externa, infective 08/10/2015  . Palpitations   . Post herpetic neuralgia   . Shingles 07/09/2013  . Sleep apnea 07/30/2011   cpap- 13   . Snoring disorder 07/30/2011  . White coat hypertension     PAST SURGICAL HISTORY: Past Surgical History:  Procedure Laterality Date  . CATARACT EXTRACTION Right   . EYE SURGERY Right 12/06/2017   cataract by Dr Gillian Scarce  . LEFT HEART CATHETERIZATION WITH CORONARY ANGIOGRAM N/A 04/10/2015   Procedure: LEFT HEART CATHETERIZATION WITH  CORONARY ANGIOGRAM;  Surgeon: Leonie Man, MD;  Location: [facility] CATH LAB;  Service: Cardiovascular;  Angiographicallly NORMAL CORONARY ARTERIES  . MASS  EXCISION N/A 05/16/2014   Procedure: EXCISION POSTERIOR NECK MASS, RIGHT CHEST WALL MASS AND RIGHT AXILLARY MASS AXILLARY NODE DISSECTION;  Surgeon: Adin Hector, MD;  Location: [facility] ORS;  Service: General;  Laterality: N/A;  . NM MYOVIEW LTD  03/19/2015   FALSE POSITIVE:  INTERMEDIATE RISK. Small sized, moderate intensity inferior ischemic perfusion defect  . TRANSTHORACIC ECHOCARDIOGRAM  03/20/2015   Normal LV size and function. EF 60-65%. G1 DD. Trivial AI. Borderline aortic root dilation (41 mm), Mild LA dilation.    SOCIAL HISTORY: Social  History   Tobacco Use  . Smoking status: Former Smoker    Types: Pipe, Cigarettes    Last attempt to quit: 02/11/1989    Years since quitting: 29.6  . Smokeless tobacco: Never Used  Substance Use Topics  . Alcohol use: Yes    Alcohol/week: 7.0 standard drinks    Types: 7 Glasses of wine per week    Comment: 1 per day -- brandy or wine  . Drug use: No    FAMILY HISTORY: Family History  Problem Relation Age of Onset  . Cancer Mother        MM, leukemia  . Obesity Mother   . Emphysema Father   . Obesity Father   . Alcohol abuse Father   . Cancer Sister        brain  . COPD Brother   . Heart disease Paternal Grandmother   . COPD Paternal Grandfather   . Diabetes Sister   . Obesity Sister   . Down syndrome Brother   . Alcohol abuse Other   . Colon cancer Neg Hx     ROS: Review of Systems  Constitutional: Positive for malaise/fatigue and weight loss.  Gastrointestinal: Negative for nausea and vomiting.  Musculoskeletal:       Negative muscle weakness  Endo/Heme/Allergies:       Positive polyphagia Negative hypoglycemia    PHYSICAL EXAM: Blood pressure 104/62, pulse 85, temperature (!) 97.4 F (36.3 C), temperature source Oral, height 5\' 9"  (1.753 m), weight 208 lb (94.3 kg), SpO2 96 %. Body mass index is 30.72 kg/m. Physical Exam  Constitutional: He is oriented to person, place, and time. He appears well-developed and well-nourished.  Cardiovascular: Normal rate.  Pulmonary/Chest: Effort normal.  Musculoskeletal: Normal range of motion.  Neurological: He is oriented to person, place, and time.  Skin: Skin is warm and dry.  Psychiatric: He has a normal mood and affect. His behavior is normal.  Vitals reviewed.   RECENT LABS AND TESTS: BMET    Component Value Date/Time   NA 143 08/29/2018 1143   K 4.7 08/29/2018 1143   CL 104 08/29/2018 1143   CO2 24 08/29/2018 1143   GLUCOSE 88 08/29/2018 1143   GLUCOSE 85 12/07/2016 1531   BUN 19 08/29/2018 1143    CREATININE 0.99 08/29/2018 1143   CREATININE 0.92 04/04/2015 1113   CALCIUM 9.3 08/29/2018 1143   GFRNONAA 74 08/29/2018 1143   GFRAA 86 08/29/2018 1143   Lab Results  Component Value Date   HGBA1C 5.6 08/29/2018   HGBA1C 5.7 (H) 04/26/2018   HGBA1C 5.8 (H) 12/22/2017   HGBA1C 5.8 (H) 08/18/2017   Lab Results  Component Value Date   INSULIN 5.6 08/29/2018   INSULIN 6.4 04/26/2018   INSULIN 12.1 12/22/2017   INSULIN 17.6 08/18/2017   CBC    Component Value Date/Time   WBC 6.7 08/18/2017 1059   WBC 8.4 12/07/2016 1531   RBC 4.88 08/18/2017 1059  RBC 4.80 12/07/2016 1531   HGB 15.5 08/18/2017 1059   HCT 46.8 08/18/2017 1059   PLT 185.0 12/07/2016 1531   MCV 96 08/18/2017 1059   MCH 31.8 08/18/2017 1059   MCH 28.8 04/04/2015 1113   MCHC 33.1 08/18/2017 1059   MCHC 34.3 12/07/2016 1531   RDW 14.3 08/18/2017 1059   LYMPHSABS 1.7 08/18/2017 1059   MONOABS 0.7 09/13/2013 1350   EOSABS 0.1 08/18/2017 1059   BASOSABS 0.0 08/18/2017 1059   Iron/TIBC/Ferritin/ %Sat No results found for: IRON, TIBC, FERRITIN, IRONPCTSAT Lipid Panel     Component Value Date/Time   CHOL 131 04/26/2018 0902   TRIG 56 04/26/2018 0902   HDL 50 04/26/2018 0902   CHOLHDL 3 04/12/2016 1004   VLDL 21.0 04/12/2016 1004   LDLCALC 70 04/26/2018 0902   Hepatic Function Panel     Component Value Date/Time   PROT 6.6 08/29/2018 1143   ALBUMIN 4.1 08/29/2018 1143   AST 20 08/29/2018 1143   ALT 19 08/29/2018 1143   ALKPHOS 57 08/29/2018 1143   BILITOT 0.9 08/29/2018 1143   BILIDIR 0.1 02/11/2014 1016   IBILI 0.3 02/11/2014 1016      Component Value Date/Time   TSH 1.680 08/18/2017 1059   TSH 1.80 12/07/2016 1531   TSH 1.77 04/12/2016 1004  Results for Geppert, Panagiotis ALDEN "MIKE" (MRN [id number]) as of 09/27/2018 09:16  Ref. Range 08/29/2018 11:43  Vitamin D, 25-Hydroxy Latest Ref Range: 30.0 - 100.0 ng/mL 61.0    ASSESSMENT AND PLAN: Vitamin D deficiency  Prediabetes  Class 1 obesity  with serious comorbidity and body mass index (BMI) of 30.0 to 30.9 in adult, unspecified obesity type  PLAN:  Vitamin D Deficiency Cecile was informed that low vitamin D levels contributes to fatigue and are associated with obesity, breast, and colon cancer. Elhadj agrees to continue taking OTC Vit D 5,000 IU daily and will follow up for routine testing of vitamin D, at least 2-3 times per year. He was informed of the risk of over-replacement of vitamin D and agrees to not increase his dose unless he discusses this with [name] first. Tijuan agrees to follow up with our clinic in 4 to 5 weeks.  Pre-Diabetes Zacchary will continue to work on weight loss, diet, exercise, and decreasing simple carbohydrates in his diet to help decrease the risk of diabetes. We dicussed metformin including benefits and risks. He was informed that eating too many simple carbohydrates or too many calories at one sitting increases the likelihood of GI side effects. Derian declined metformin for now and a prescription was not written today. We will recheck labs in 3 months. Kailin agrees to follow up with our clinic in 4 to 5 weeks as directed to monitor his progress.  Obesity Mable is currently in the action stage of change. As such, his goal is to continue with weight loss efforts He has agreed to follow the Category 3 plan Damian has been instructed to work up to a goal of 150 minutes of combined cardio and strengthening exercise per week or as is for weight loss and overall health benefits. We discussed the following Behavioral Modification Strategies today: increasing lean protein intake, decreasing simple carbohydrates  and work on meal planning and easy cooking plans  I spent > than 50% of the 25 minute visit on counseling as documented in the note.   Orvie has agreed to follow up with our clinic in 4 to 5 weeks. He was informed of the importance  of frequent follow up visits to maximize his success with intensive lifestyle  modifications for his multiple health conditions.   OBESITY BEHAVIORAL INTERVENTION VISIT  Today's visit was # 21   Starting weight: 237 lbs Starting date: 08/18/17 Today's weight : 208 lbs  Today's date: 09/26/2018 Total lbs lost to date: 60    ASK: We discussed the diagnosis of obesity with [name] today and Hermen agreed to give [name] permission to discuss obesity behavioral modification therapy today.  ASSESS: Boe has the diagnosis of obesity and his BMI today is 30.7 Kamarie is in the action stage of change   ADVISE: Vuk was educated on the multiple health risks of obesity as well as the benefit of weight loss to improve his health. He was advised of the need for long term treatment and the importance of lifestyle modifications to improve his current health and to decrease his risk of future health problems.  AGREE: Multiple dietary modification options and treatment options were discussed and  Raistlin agreed to follow the recommendations documented in the above note.  ARRANGE: Adell was educated on the importance of frequent visits to treat obesity as outlined per CMS and USPSTF guidelines and agreed to schedule his next follow up appointment today.  I, Trixie Dredge, am acting as transcriptionist for Dennard Nip, MD  I have reviewed the above documentation for accuracy and completeness, and I agree with the above. -Dennard Nip, MD

## 2018-10-06 ENCOUNTER — Ambulatory Visit (INDEPENDENT_AMBULATORY_CARE_PROVIDER_SITE_OTHER): Payer: Medicare Other

## 2018-10-06 DIAGNOSIS — Z23 Encounter for immunization: Secondary | ICD-10-CM

## 2018-10-10 ENCOUNTER — Encounter: Payer: Self-pay | Admitting: Cardiology

## 2018-10-10 ENCOUNTER — Ambulatory Visit (INDEPENDENT_AMBULATORY_CARE_PROVIDER_SITE_OTHER): Payer: Medicare Other | Admitting: Cardiology

## 2018-10-10 VITALS — BP 114/62 | HR 74 | Ht 70.0 in | Wt 214.2 lb

## 2018-10-10 DIAGNOSIS — I4819 Other persistent atrial fibrillation: Secondary | ICD-10-CM | POA: Diagnosis not present

## 2018-10-10 DIAGNOSIS — R001 Bradycardia, unspecified: Secondary | ICD-10-CM

## 2018-10-10 NOTE — Patient Instructions (Signed)
Medication Instructions:  Not needed If you need a refill on your cardiac medications before your next appointment, please call your pharmacy.   Lab work: Not needed If you have labs (blood work) drawn today and your tests are completely normal, you will receive your results only by: Marland Kitchen MyChart Message (if you have MyChart) OR . A paper copy in the mail If you have any lab test that is abnormal or we need to change your treatment, we will call you to review the results.  Testing/Procedures: Not needed  Follow-Up: At [facility], you and your health needs are our priority.  As part of our continuing mission to provide you with exceptional heart care, we have created designated Provider Care Teams.  These Care Teams include your primary Cardiologist (physician) and Advanced Practice Providers (APPs -  Physician Assistants and Nurse Practitioners) who all work together to provide you with the care you need, when you need it. You will need a follow up appointment in 12 months- OCT 2020.  Please call our office 2 months in advance to schedule this appointment.  You may see Glenetta Hew, MD or one of the following Advanced Practice Providers on your designated Care Team:   Rosaria Ferries, PA-C . Jory Sims, DNP, ANP  Any Other Special Instructions Will Be Listed Below (If Applicable).  KEEP AN EYE OUT FOR WHAT YOU AND DR HARDING DISCUSS.

## 2018-10-10 NOTE — Progress Notes (Signed)
PCP: Mosie Lukes, MD  Clinic Note: No chief complaint on file.   HPI: [name] [name] is a 77 y.o. male with a PMH below who presents today for ~6 month f/u for persistent AFib. PMH of A. fib with chronic bradycardia, nonobstructive CAD 2016 cath (radial spasm) after false + MV, BPH, GERD, HLD, Afib, elev PSA, OSA on CPAP I last saw him in April 2016.  [name] [name] was last seen on March 14, 2018 by Rosaria Ferries, PA-C.  He had an EKG at his PCPs office demonstrating A. fib and seen noted to have Afib during this visit.  Was totally asymptomatic.-- started on Eliquis -because of CHA2DS2Vasc score of 2 (Age 55 - with preDM - ~3)).  This was initiated at the urging of his Daughter [name], [facility] - one of our [place] Pharmacists).  His A. fib rate was controlled without any medications, and he was a symptom medic, so no rate or rhythm control was initiated.  Recent Hospitalizations: none  Studies Personally Reviewed - (if available, images/films reviewed: From Epic Chart or Care Everywhere)  None since 2016 evaluation.  Interval History: [name] [name] returns here today for follow-up.  I have not seen him about 3 years, but have been keeping in touch with his health through conversations with his daughter.  We have discussed starting anticoagulation when he turns 77, and he is close that now so we went ahead and started the Eliquis when he saw a [place].  He really has not noticed being in A. fib at all.  He still does whatever he wants to do, is active as ever.  No fatigue or dyspnea.  No chest tightness or pressure with rest or exertion.  No PND, orthopnea or edema.  He really only notes symptoms if he really tries to exert himself a lot more than usual. There is concern that his A. fib rate has been down in the 50s when his PCPs office.  He himself is not noticing any fatigue or weakness or exercise intolerance.  Says he is able  to get his heart rate up peripherally fine with activity. No bleeding issues on Eliquis.  No lightheadedness, dizziness, weakness or syncope/near syncope. No TIA/amaurosis fugax symptoms. No melena, hematochezia, hematuria, or epstaxis. No claudication.  Review of Systems  Constitutional: Negative for malaise/fatigue and weight loss.  HENT: Negative for congestion.   Respiratory: Negative for cough, shortness of breath and wheezing.   Genitourinary: Negative for dysuria.  Musculoskeletal: Negative for falls and joint pain.  Neurological: Negative for dizziness, focal weakness, weakness and headaches.  Endo/Heme/Allergies: Does not bruise/bleed easily.  All other systems reviewed and are negative.  I have reviewed and (if needed) personally updated the patient's problem list, medications, allergies, past medical and surgical history, social and family history.   Past Medical History:  Diagnosis Date  . BPH (benign prostatic hypertrophy)   . Bradycardia 12/30/2014   HR routinely in mid 40s-50s  . Coronary artery disease (CAD) excluded April 2016   False-positive nuclear stress test suggesting inferior ischemia  . Elevated PSA   . Encounter for Medicare annual wellness exam 09/22/2013   Sees Dr Wilhemina Bonito of Derm Sees Dr Silvano Rusk of Gastroenterology Sees Dr Kathie Rhodes of [facility] Sees Dr Susa Day of Optometry  . Finger pain, left 04/12/2016   4th   . GERD (gastroesophageal reflux disease)    controlled w/ diet and behavioral changes  . Hypertriglyceridemia  03/16/2015  . Lymphadenitis   . Melanoma of back ([place])    Excised Dr Wilhemina Bonito  . New onset a-fib ([place]) 03/10/2015  . Otitis, externa, infective 08/10/2015  . Palpitations   . Post herpetic neuralgia   . Shingles 07/09/2013  . Sleep apnea 07/30/2011   cpap- 13   . Snoring disorder 07/30/2011  . White coat hypertension     Past Surgical History:  Procedure Laterality Date  . CATARACT EXTRACTION Right   . EYE  SURGERY Right 12/06/2017   cataract by Dr Gillian Scarce  . LEFT HEART CATHETERIZATION WITH CORONARY ANGIOGRAM N/A 04/10/2015   Procedure: LEFT HEART CATHETERIZATION WITH  CORONARY ANGIOGRAM;  Surgeon: Leonie Man, MD;  Location: [facility] CATH LAB;  Service: Cardiovascular;  Angiographicallly NORMAL CORONARY ARTERIES  . MASS EXCISION N/A 05/16/2014   Procedure: EXCISION POSTERIOR NECK MASS, RIGHT CHEST WALL MASS AND RIGHT AXILLARY MASS AXILLARY NODE DISSECTION;  Surgeon: Adin Hector, MD;  Location: [facility] ORS;  Service: General;  Laterality: N/A;  . NM MYOVIEW LTD  03/19/2015   FALSE POSITIVE:  INTERMEDIATE RISK. Small sized, moderate intensity inferior ischemic perfusion defect  . TRANSTHORACIC ECHOCARDIOGRAM  03/20/2015   Normal LV size and function. EF 60-65%. G1 DD. Trivial AI. Borderline aortic root dilation (41 mm), Mild LA dilation.    Current Meds  Medication Sig  . apixaban (ELIQUIS) 5 MG TABS tablet Take 1 tablet (5 mg total) by mouth 2 (two) times daily.  . Cholecalciferol (VITAMIN D3) 5000 units CAPS Take 1 capsule by mouth daily.  Marland Kitchen gabapentin (NEURONTIN) 300 MG capsule TAKE 3 CAPSULES BY MOUTH THREE TIMES DAILY  . Multiple Vitamin (MULTIVITAMIN WITH MINERALS) TABS tablet Take 1 tablet by mouth daily.    No Known Allergies  Social History   Tobacco Use  . Smoking status: Former Smoker    Types: Pipe, Cigarettes    Last attempt to quit: 02/11/1989    Years since quitting: 29.6  . Smokeless tobacco: Never Used  Substance Use Topics  . Alcohol use: Yes    Alcohol/week: 7.0 standard drinks    Types: 7 Glasses of wine per week    Comment: 1 per day -- brandy or wine  . Drug use: No   Social History   Social History Narrative   He is a married father of 2, and grandfather of 1. He has been married to his wife [name] for 51 years. He previously lived in [place] but has moved with his wife to [place], [place] to be close to his daughter [name] and her family. [name] is our Physiological scientist.   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 He quit smoking in 1990. He has 6-7 glasses of brandy or wine a week.    He exercises regularly with low impact aerobics 2 days a week and daily walks of 30-45 minutes. His exercise sessions or 60 minutes. He will do some type of exercise at least 7 days a week and is very active doing yard work and wood work.    family history includes Alcohol abuse in his father and other; COPD in his brother and paternal grandfather; Cancer in his mother and sister; Diabetes in his sister; Down syndrome in his brother; Emphysema in his father; Heart disease in his paternal grandmother; Obesity in his father, mother, and sister.  Wt Readings from Last 3 Encounters:  10/10/18 214 lb 3.2 oz (97.2 kg)  09/26/18 208 lb (  94.3 kg)  08/29/18 210 lb (95.3 kg)    PHYSICAL EXAM BP 114/62   Pulse 74   Ht 5\' 10"  (1.778 m)   Wt 214 lb 3.2 oz (97.2 kg)   BMI 30.73 kg/m  Physical Exam  Constitutional: He is oriented to person, place, and time. He appears well-developed and well-nourished. No distress.  Healthy-appearing.  Well-groomed.  Appears younger than stated age.  HENT:  Head: Normocephalic and atraumatic.  Neck: Normal range of motion. Neck supple. No hepatojugular reflux and no JVD present. Carotid bruit is not present. No thyromegaly present.  Cardiovascular: Normal rate, normal heart sounds and intact distal pulses. An irregularly irregular rhythm present. PMI is not displaced. Exam reveals no gallop and no friction rub.  No murmur heard. Pulmonary/Chest: Breath sounds normal. No respiratory distress. He has no wheezes. He has no rales.  Abdominal: Soft. Bowel sounds are normal. He exhibits no distension. There is no tenderness. There is no rebound.  No HSM  Musculoskeletal: Normal range of motion. He exhibits no edema.  Neurological: He is alert and oriented to person, place, and time. No cranial nerve  deficit.  Psychiatric: He has a normal mood and affect. His behavior is normal. Judgment and thought content normal.  Vitals reviewed.   Adult ECG Report  Rate: 74 ;  Rhythm: atrial fibrillation and Cannot rule out anterior MI, age undetermined.  Otherwise low voltage, but normal intervals, axis and distance..;   Narrative Interpretation: Relatively stable EKG   Other studies Reviewed: Additional studies/ records that were reviewed today include:  Recent Labs:   Lab Results  Component Value Date   CHOL 131 04/26/2018   HDL 50 04/26/2018   LDLCALC 70 04/26/2018   TRIG 56 04/26/2018   CHOLHDL 3 04/12/2016   Lab Results  Component Value Date   CREATININE 0.99 08/29/2018   BUN 19 08/29/2018   NA 143 08/29/2018   K 4.7 08/29/2018   CL 104 08/29/2018   CO2 24 08/29/2018    ASSESSMENT / PLAN: Problem List Items Addressed This Visit    Bradycardia (Chronic)    Currently not bradycardic -although he is pretty much rate controlled without any AV nodal agents. For now no need for AV nodal agents or rhythm control agents as he is asymptomatic. We did discuss the natural progression of A. fib and the possibility of tachybradycardia syndrome.  However for now I think he is doing fine would simply monitor.  He only notes his A. fib if his heart rate goes fast, so he will notice if he goes fast.   We discussed symptoms to be concerned about that could potentially be indicative of bradycardia --near syncope, syncope, exercise intolerance and fatigue.      Relevant Orders   EKG 12-Lead (Completed)   Persistent atrial fibrillation - Primary (Chronic)    Essentially rate controlled without medicines.  Asymptomatic.  Started on Eliquis without any bleeding issues.      Relevant Orders   EKG 12-Lead (Completed)      Current medicines are reviewed at length with the patient today.  (+/- concerns) none The following changes have been made:  none  Patient Instructions  Medication  Instructions:  Not needed If you need a refill on your cardiac medications before your next appointment, please call your pharmacy.   Lab work: Not needed If you have labs (blood work) drawn today and your tests are completely normal, you will receive your results only by: Marland Kitchen MyChart Message (if  you have MyChart) OR . A paper copy in the mail If you have any lab test that is abnormal or we need to change your treatment, we will call you to review the results.  Testing/Procedures: Not needed  Follow-Up: At [facility], you and your health needs are our priority.  As part of our continuing mission to provide you with exceptional heart care, we have created designated Provider Care Teams.  These Care Teams include your primary Cardiologist (physician) and Advanced Practice Providers (APPs -  Physician Assistants and Nurse Practitioners) who all work together to provide you with the care you need, when you need it. You will need a follow up appointment in 12 months- OCT 2020.  Please call our office 2 months in advance to schedule this appointment.  You may see Glenetta Hew, MD or one of the following Advanced Practice Providers on your designated Care Team:   Rosaria Ferries, PA-C . Jory Sims, DNP, ANP  Any Other Special Instructions Will Be Listed Below (If Applicable).  KEEP AN EYE OUT FOR WHAT YOU AND DR HARDING DISCUSS.    Studies Ordered:   Orders Placed This Encounter  Procedures  . EKG 12-Lead      Glenetta Hew, M.D., M.S. Interventional Cardiologist   Pager # [phone] Phone # [phone] [address] [place], [place] [address]   Thank you for choosing Heartcare at [facility]!!

## 2018-10-12 NOTE — Assessment & Plan Note (Signed)
Essentially rate controlled without medicines.  Asymptomatic.  Started on Eliquis without any bleeding issues.

## 2018-10-12 NOTE — Assessment & Plan Note (Signed)
Currently not bradycardic -although he is pretty much rate controlled without any AV nodal agents. For now no need for AV nodal agents or rhythm control agents as he is asymptomatic. We did discuss the natural progression of A. fib and the possibility of tachybradycardia syndrome.  However for now I think he is doing fine would simply monitor.  He only notes his A. fib if his heart rate goes fast, so he will notice if he goes fast.   We discussed symptoms to be concerned about that could potentially be indicative of bradycardia --near syncope, syncope, exercise intolerance and fatigue.

## 2018-10-24 ENCOUNTER — Telehealth: Payer: Self-pay | Admitting: *Deleted

## 2018-10-24 ENCOUNTER — Ambulatory Visit (INDEPENDENT_AMBULATORY_CARE_PROVIDER_SITE_OTHER): Payer: Medicare Other | Admitting: Family Medicine

## 2018-10-24 VITALS — BP 103/70 | HR 77 | Temp 97.6°F | Ht 69.0 in | Wt 208.0 lb

## 2018-10-24 DIAGNOSIS — E669 Obesity, unspecified: Secondary | ICD-10-CM

## 2018-10-24 DIAGNOSIS — Z6830 Body mass index (BMI) 30.0-30.9, adult: Secondary | ICD-10-CM | POA: Diagnosis not present

## 2018-10-24 DIAGNOSIS — E559 Vitamin D deficiency, unspecified: Secondary | ICD-10-CM

## 2018-10-24 NOTE — Progress Notes (Signed)
Office: [phone]  /  Fax: [phone]   HPI:   Chief Complaint: OBESITY [name] [name] is here to discuss his progress with his obesity treatment plan. He is on the  follow the PC/[place] plan and is following his eating plan approximately 75 % of the time. He states he is exercising 60 minutes 2 times per week.[name] [name] continues to do well maintaining his weight. He is not following his Cat 3 plan closely, mostly PC/[place]. He would like to loose another 3-4 lbs.   His weight is 208 lb (94.3 kg) today and maintained since his last visit. He has lost 29 lbs since starting treatment with [name].  Vitamin D deficiency [name] [name] has a diagnosis of vitamin D deficiency. He is currently taking vit D and denies nausea, vomiting or muscle weakness. Patient is currently stable on 3k daily of Vitamin D, will be due for labs again in December.   ALLERGIES: No Known Allergies  MEDICATIONS: Current Outpatient Medications on File Prior to Visit  Medication Sig Dispense Refill  . apixaban (ELIQUIS) 5 MG TABS tablet Take 1 tablet (5 mg total) by mouth 2 (two) times daily. 180 tablet 3  . Cholecalciferol (VITAMIN D3) 5000 units CAPS Take 1 capsule by mouth daily.    Marland Kitchen gabapentin (NEURONTIN) 300 MG capsule TAKE 3 CAPSULES BY MOUTH THREE TIMES DAILY 270 capsule 3  . Multiple Vitamin (MULTIVITAMIN WITH MINERALS) TABS tablet Take 1 tablet by mouth daily.     No current facility-administered medications on file prior to visit.     PAST MEDICAL HISTORY: Past Medical History:  Diagnosis Date  . BPH (benign prostatic hypertrophy)   . Bradycardia 12/30/2014   HR routinely in mid 40s-50s  . Coronary artery disease (CAD) excluded [name] 2016   False-positive nuclear stress test suggesting inferior ischemia  . Elevated PSA   . Encounter for Medicare annual wellness exam 09/22/2013   Sees Dr Wilhemina Bonito of Derm Sees Dr Silvano Rusk of Gastroenterology Sees Dr Kathie Rhodes of [facility] Sees Dr Susa Day of Optometry  .  Finger pain, left 04/12/2016   4th   . GERD (gastroesophageal reflux disease)    controlled w/ diet and behavioral changes  . Hypertriglyceridemia 03/16/2015  . Lymphadenitis   . Melanoma of back ([place])    Excised Dr Wilhemina Bonito  . New onset a-fib ([place]) 03/10/2015  . Otitis, externa, infective 08/10/2015  . Palpitations   . Post herpetic neuralgia   . Shingles 07/09/2013  . Sleep apnea 07/30/2011   cpap- 13   . Snoring disorder 07/30/2011  . White coat hypertension     PAST SURGICAL HISTORY: Past Surgical History:  Procedure Laterality Date  . CATARACT EXTRACTION Right   . EYE SURGERY Right 12/06/2017   cataract by Dr Gillian Scarce  . LEFT HEART CATHETERIZATION WITH CORONARY ANGIOGRAM N/A 04/10/2015   Procedure: LEFT HEART CATHETERIZATION WITH  CORONARY ANGIOGRAM;  Surgeon: Leonie Man, MD;  Location: [facility] CATH LAB;  Service: Cardiovascular;  Angiographicallly NORMAL CORONARY ARTERIES  . MASS EXCISION N/A 05/16/2014   Procedure: EXCISION POSTERIOR NECK MASS, RIGHT CHEST WALL MASS AND RIGHT AXILLARY MASS AXILLARY NODE DISSECTION;  Surgeon: Adin Hector, MD;  Location: [facility] ORS;  Service: General;  Laterality: N/A;  . NM MYOVIEW LTD  03/19/2015   FALSE POSITIVE:  INTERMEDIATE RISK. Small sized, moderate intensity inferior ischemic perfusion defect  . TRANSTHORACIC ECHOCARDIOGRAM  03/20/2015   Normal LV size and function. EF 60-65%. G1 DD. Trivial AI. Borderline aortic root  dilation (41 mm), Mild LA dilation.    SOCIAL HISTORY: Social History   Tobacco Use  . Smoking status: Former Smoker    Types: Pipe, Cigarettes    Last attempt to quit: 02/11/1989    Years since quitting: 29.7  . Smokeless tobacco: Never Used  Substance Use Topics  . Alcohol use: Yes    Alcohol/week: 7.0 standard drinks    Types: 7 Glasses of wine per week    Comment: 1 per day -- brandy or wine  . Drug use: No    FAMILY HISTORY: Family History  Problem Relation Age of Onset  . Cancer Mother        MM, leukemia    . Obesity Mother   . Emphysema Father   . Obesity Father   . Alcohol abuse Father   . Cancer Sister        brain  . COPD Brother   . Heart disease Paternal Grandmother   . COPD Paternal Grandfather   . Diabetes Sister   . Obesity Sister   . Down syndrome Brother   . Alcohol abuse Other   . Colon cancer Neg Hx     ROS: Review of Systems  All other systems reviewed and are negative.   PHYSICAL EXAM: Blood pressure 103/70, pulse 77, temperature 97.6 F (36.4 C), temperature source Oral, height 5\' 9"  (1.753 m), weight 208 lb (94.3 kg), SpO2 100 %. Body mass index is 30.72 kg/m. Physical Exam  Constitutional: He is oriented to person, place, and time. He appears well-developed and well-nourished.  HENT:  Head: Normocephalic.  Eyes: Pupils are equal, round, and reactive to light.  Neck: Normal range of motion.  Pulmonary/Chest: Effort normal.  Musculoskeletal: Normal range of motion.  Neurological: He is alert and oriented to person, place, and time.  Skin: Skin is warm.  Psychiatric: He has a normal mood and affect. His behavior is normal.  Vitals reviewed.   RECENT LABS AND TESTS: BMET    Component Value Date/Time   NA 143 08/29/2018 1143   K 4.7 08/29/2018 1143   CL 104 08/29/2018 1143   CO2 24 08/29/2018 1143   GLUCOSE 88 08/29/2018 1143   GLUCOSE 85 12/07/2016 1531   BUN 19 08/29/2018 1143   CREATININE 0.99 08/29/2018 1143   CREATININE 0.92 04/04/2015 1113   CALCIUM 9.3 08/29/2018 1143   GFRNONAA 74 08/29/2018 1143   GFRAA 86 08/29/2018 1143   Lab Results  Component Value Date   HGBA1C 5.6 08/29/2018   HGBA1C 5.7 (H) 04/26/2018   HGBA1C 5.8 (H) 12/22/2017   HGBA1C 5.8 (H) 08/18/2017   Lab Results  Component Value Date   INSULIN 5.6 08/29/2018   INSULIN 6.4 04/26/2018   INSULIN 12.1 12/22/2017   INSULIN 17.6 08/18/2017   CBC    Component Value Date/Time   WBC 6.7 08/18/2017 1059   WBC 8.4 12/07/2016 1531   RBC 4.88 08/18/2017 1059   RBC  4.80 12/07/2016 1531   HGB 15.5 08/18/2017 1059   HCT 46.8 08/18/2017 1059   PLT 185.0 12/07/2016 1531   MCV 96 08/18/2017 1059   MCH 31.8 08/18/2017 1059   MCH 28.8 04/04/2015 1113   MCHC 33.1 08/18/2017 1059   MCHC 34.3 12/07/2016 1531   RDW 14.3 08/18/2017 1059   LYMPHSABS 1.7 08/18/2017 1059   MONOABS 0.7 09/13/2013 1350   EOSABS 0.1 08/18/2017 1059   BASOSABS 0.0 08/18/2017 1059   Iron/TIBC/Ferritin/ %Sat No results found for: IRON, TIBC, FERRITIN, IRONPCTSAT  Lipid Panel     Component Value Date/Time   CHOL 131 04/26/2018 0902   TRIG 56 04/26/2018 0902   HDL 50 04/26/2018 0902   CHOLHDL 3 04/12/2016 1004   VLDL 21.0 04/12/2016 1004   LDLCALC 70 04/26/2018 0902   Hepatic Function Panel     Component Value Date/Time   PROT 6.6 08/29/2018 1143   ALBUMIN 4.1 08/29/2018 1143   AST 20 08/29/2018 1143   ALT 19 08/29/2018 1143   ALKPHOS 57 08/29/2018 1143   BILITOT 0.9 08/29/2018 1143   BILIDIR 0.1 02/11/2014 1016   IBILI 0.3 02/11/2014 1016      Component Value Date/Time   TSH 1.680 08/18/2017 1059   TSH 1.80 12/07/2016 1531   TSH 1.77 04/12/2016 1004    ASSESSMENT AND PLAN: Vitamin D deficiency  Class 1 obesity with serious comorbidity and body mass index (BMI) of 30.0 to 30.9 in adult, unspecified obesity type  PLAN: Vitamin D Deficiency [name] [name] was informed that low vitamin D levels contributes to fatigue and are associated with obesity, breast, and colon cancer. He agrees to continue to take prescription Vit D @3k /daily and will follow up for routine testing of vitamin D, at least 2-3 times per year. He was informed of the risk of over-replacement of vitamin D and agrees to not increase his dose unless he discusses this with [name] first. Will check labs on next visit and follow up.   Obesity [name] [name] is currently in the action stage of change. As such, his goal is to continue with weight loss efforts He has agreed to follow the Category 3 plan daily. The patient is  encouraged to go back to Cat 3 if he wants to loose a biut more but he can do the PC/[place] to maintain.  [name] [name] has been instructed to work up to a goal of 150 minutes of combined cardio and strengthening exercise per week for weight loss and overall health benefits. We discussed the following Behavioral Modification Stratagies today: work on meal planning and easy cooking plans and holiday eating strategies.    [name] [name] has agreed to follow up with our clinic in 6 weeks. He was informed of the importance of frequent follow up visits to maximize his success with intensive lifestyle modifications for his multiple health conditions.  I spent > than 50% of the 15 minute visit on counseling as documented in the note.   OBESITY BEHAVIORAL INTERVENTION VISIT  Today's visit was # 22  Starting weight: 237 lbs Starting date: 08/18/17 Today's weight : Weight: 208 lb (94.3 kg)  Today's date: 10/24/2018 Total lbs lost to date: 29 At least 15 minutes were spent on discussing the following behavioral intervention visit.   ASK: We discussed the diagnosis of obesity with [name] [name] today and [name] [name] agreed to give [name] permission to discuss obesity behavioral modification therapy today.  ASSESS: [name] [name] has the diagnosis of obesity and his BMI today is 30.7 [name] [name] is in the action stage of change   ADVISE: [name] [name] was educated on the multiple health risks of obesity as well as the benefit of weight loss to improve his health. He was advised of the need for long term treatment and the importance of lifestyle modifications to improve his current health and to decrease his risk of future health problems.  AGREE: Multiple dietary modification options and treatment options were discussed and  [name] [name] agreed to follow the recommendations documented in the above note.  ARRANGE: [name] [name] was educated on the importance  of frequent visits to treat obesity as outlined per CMS and USPSTF guidelines and agreed to schedule  his next follow up appointment today.  I, [name] [name], am acting as Location manager for Dr Dennard Nip.   I have reviewed the above documentation for accuracy and completeness, and I agree with the above. -Dennard Nip, MD

## 2018-10-24 NOTE — Telephone Encounter (Signed)
Received Physician Orders from [facility] requesting prescription for Custom Compression  Sleeve for Lymphedema; forwarded to provider/SLS 10/29

## 2018-10-25 ENCOUNTER — Encounter (INDEPENDENT_AMBULATORY_CARE_PROVIDER_SITE_OTHER): Payer: Self-pay | Admitting: Family Medicine

## 2018-10-30 ENCOUNTER — Encounter: Payer: Self-pay | Admitting: Family Medicine

## 2018-10-30 ENCOUNTER — Ambulatory Visit (INDEPENDENT_AMBULATORY_CARE_PROVIDER_SITE_OTHER): Payer: Medicare Other | Admitting: Family Medicine

## 2018-10-30 VITALS — BP 112/68 | HR 95 | Temp 97.4°F | Ht 69.0 in | Wt 214.0 lb

## 2018-10-30 DIAGNOSIS — N5089 Other specified disorders of the male genital organs: Secondary | ICD-10-CM | POA: Diagnosis not present

## 2018-10-30 NOTE — Progress Notes (Signed)
Pre visit review using our clinic review tool, if applicable. No additional management support is needed unless otherwise documented below in the visit note. 

## 2018-10-30 NOTE — Progress Notes (Signed)
Chief Complaint  Patient presents with  . Groin Pain    Subjective: Patient is a 77 y.o. male here for L sided groin pain.  This started around 2-3 weeks ago. No injury. Notices after being on feet for several hours and goes away after resting for 10-15 min. Comes back after 1-2 hours of standing. No bulges, bruising, swelling, urinary complaints, unexplained weight loss, testicular pain, weakness, catching/locking of hip. Has not tried anything at home.   ROS: Heme: No bruising  Past Medical History:  Diagnosis Date  . BPH (benign prostatic hypertrophy)   . Bradycardia 12/30/2014   HR routinely in mid 40s-50s  . Coronary artery disease (CAD) excluded April 2016   False-positive nuclear stress test suggesting inferior ischemia  . Elevated PSA   . Encounter for Medicare annual wellness exam 09/22/2013   Sees Dr Wilhemina Bonito of Derm Sees Dr Silvano Rusk of Gastroenterology Sees Dr Kathie Rhodes of [facility] Sees Dr Susa Day of Optometry  . Finger pain, left 04/12/2016   4th   . GERD (gastroesophageal reflux disease)    controlled w/ diet and behavioral changes  . Hypertriglyceridemia 03/16/2015  . Lymphadenitis   . Melanoma of back ([place])    Excised Dr Wilhemina Bonito  . New onset a-fib ([place]) 03/10/2015  . Otitis, externa, infective 08/10/2015  . Palpitations   . Post herpetic neuralgia   . Shingles 07/09/2013  . Sleep apnea 07/30/2011   cpap- 13   . Snoring disorder 07/30/2011  . White coat hypertension     Objective: BP 112/68 (BP Location: Left Arm, Patient Position: Sitting, Cuff Size: Large)   Pulse 95   Temp (!) 97.4 F (36.3 C) (Oral)   Ht 5\' 9"  (1.753 m)   Wt 214 lb (97.1 kg)   SpO2 99%   BMI 31.60 kg/m  General: Awake, appears stated age GU: No hernia, no testicular ttp, the L spermatic cord is mildly ttp and he notes that is where his pain will be; no edema, excessive warmth or erythema MSK: Gait is normal, there is no muscular tenderness to palpation, negative  [place], logroll, FABER, FADDIR Lungs: No accessory muscle use Psych: Age appropriate judgment and insight, normal affect and mood  Assessment and Plan: Spermatic cord pain  Likely from unknown trauma.  Ice, Tylenol, supportive undergarments.  If no improvement, will obtain an ultrasound and consider getting urine studies. Follow-up as needed. The patient voiced understanding and agreement to the plan.  [place], DO 10/30/18  11:37 AM

## 2018-10-30 NOTE — Patient Instructions (Addendum)
Wear supportive undergarments.  Ice/cold pack over area for 10-15 min twice daily.  OK to take Tylenol 1000 mg (2 extra strength tabs) or 975 mg (3 regular strength tabs) every 6 hours as needed.  Listen to your body.  If no improvement, let me know and we will consider an ultrasound. Send me a MyChart message in the next 10-14 days if no improvement.   Let us know if you need anything.

## 2018-11-27 ENCOUNTER — Ambulatory Visit (INDEPENDENT_AMBULATORY_CARE_PROVIDER_SITE_OTHER): Payer: Medicare Other

## 2018-11-27 ENCOUNTER — Ambulatory Visit (INDEPENDENT_AMBULATORY_CARE_PROVIDER_SITE_OTHER): Payer: Medicare Other | Admitting: Podiatry

## 2018-11-27 ENCOUNTER — Encounter: Payer: Self-pay | Admitting: Podiatry

## 2018-11-27 ENCOUNTER — Other Ambulatory Visit: Payer: Self-pay | Admitting: Podiatry

## 2018-11-27 DIAGNOSIS — M2042 Other hammer toe(s) (acquired), left foot: Secondary | ICD-10-CM

## 2018-11-27 DIAGNOSIS — D169 Benign neoplasm of bone and articular cartilage, unspecified: Secondary | ICD-10-CM

## 2018-11-27 DIAGNOSIS — M779 Enthesopathy, unspecified: Secondary | ICD-10-CM | POA: Diagnosis not present

## 2018-11-27 MED ORDER — TRIAMCINOLONE ACETONIDE 10 MG/ML IJ SUSP
10.0000 mg | Freq: Once | INTRAMUSCULAR | Status: AC
  Administered 2018-11-27: 10 mg

## 2018-11-30 DIAGNOSIS — R1030 Lower abdominal pain, unspecified: Secondary | ICD-10-CM | POA: Diagnosis not present

## 2018-12-03 NOTE — Progress Notes (Signed)
Subjective:   Patient ID: [name] [name], male   DOB: 77 y.o.   MRN: [id number]   HPI Patient presents with inflammation pain of the fourth digit left foot and states that the fifth digit is also inflamed and sore but in a different spot will be to the previous surgery   ROS      Objective:  Physical Exam  Neurovascular status intact with digital deformity digit for left exostectomy digit 5 left with a different spot than the previous one.  Vertigo several years ago     Assessment:  Inflammatory capsulitis of the interphalangeal joint digit 5 left with keratotic lesion formation and mild to moderate discomfort fourth toe with hammertoe deformity     Plan:  Educated patient on hammertoe deformity and considerations for surgery at one point in future and I did go ahead today I anesthetized the fifth digit I did a local block of the area I then did sterile prep and injected the interphalangeal joint with 1.5 mg dexamethasone 1.5 mg Kenalog.  Applied padding discussed possible surgery in future  X-ray indicates satisfactory resection of bone with no indications of pathology

## 2018-12-05 ENCOUNTER — Ambulatory Visit (INDEPENDENT_AMBULATORY_CARE_PROVIDER_SITE_OTHER): Payer: Medicare Other | Admitting: Family Medicine

## 2018-12-05 VITALS — BP 104/70 | HR 53 | Ht 69.0 in | Wt 206.0 lb

## 2018-12-05 DIAGNOSIS — Z6830 Body mass index (BMI) 30.0-30.9, adult: Secondary | ICD-10-CM | POA: Diagnosis not present

## 2018-12-05 DIAGNOSIS — E669 Obesity, unspecified: Secondary | ICD-10-CM

## 2018-12-05 DIAGNOSIS — E559 Vitamin D deficiency, unspecified: Secondary | ICD-10-CM

## 2018-12-05 DIAGNOSIS — R5383 Other fatigue: Secondary | ICD-10-CM | POA: Diagnosis not present

## 2018-12-05 DIAGNOSIS — R7303 Prediabetes: Secondary | ICD-10-CM

## 2018-12-05 NOTE — Progress Notes (Signed)
Office: [phone]  /  Fax: [phone]   HPI:   Chief Complaint: OBESITY [name] [name] is here to discuss his progress with his obesity treatment plan. He is on the Category 3 plan and is following his eating plan approximately 75 % of the time. He states he is doing exercise classes, general fitness for 60 minutes 2 times per week. Stokes has done very well with diet and weight loss. He realized he had been eating more peanuts at night and the calories were high so he has been cutting back to smaller portions.  His weight is 206 lb (93.4 kg) today and has had a weight loss of 2 pounds over a period of 6 weeks since his last visit. He has lost 31 lbs since starting treatment with [name].  Pre-Diabetes [name] [name] has a diagnosis of pre-diabetes based on his elevated Hgb A1c and was informed this puts him at greater risk of developing diabetes. He is not taking metformin currently and has been improving with diet and exercise to decrease risk of diabetes. He denies nausea, vomiting, or hypoglycemia.  Vitamin D Deficiency [name] [name] has a diagnosis of vitamin D deficiency. He is stable on OTC Vit D 5,000 IU daily and last level at goal. He denies nausea, vomiting or muscle weakness.  Fatigue [name] [name] notes fatigue has improved overall, but he hasn't had thyroid check in over 1 year.   ALLERGIES: No Known Allergies  MEDICATIONS: Current Outpatient Medications on File Prior to Visit  Medication Sig Dispense Refill  . apixaban (ELIQUIS) 5 MG TABS tablet Take 1 tablet (5 mg total) by mouth 2 (two) times daily. 180 tablet 3  . Cholecalciferol (VITAMIN D3) 5000 units CAPS Take 1 capsule by mouth daily.    Marland Kitchen gabapentin (NEURONTIN) 300 MG capsule TAKE 3 CAPSULES BY MOUTH THREE TIMES DAILY 270 capsule 3  . Multiple Vitamin (MULTIVITAMIN WITH MINERALS) TABS tablet Take 1 tablet by mouth daily.     No current facility-administered medications on file prior to visit.     PAST MEDICAL HISTORY: Past Medical History:    Diagnosis Date  . BPH (benign prostatic hypertrophy)   . Bradycardia 12/30/2014   HR routinely in mid 40s-50s  . Coronary artery disease (CAD) excluded April 2016   False-positive nuclear stress test suggesting inferior ischemia  . Elevated PSA   . Encounter for Medicare annual wellness exam 09/22/2013   Sees Dr Wilhemina Bonito of Derm Sees Dr Silvano Rusk of Gastroenterology Sees Dr Kathie Rhodes of [facility] Sees Dr Susa Day of Optometry  . Finger pain, left 04/12/2016   4th   . GERD (gastroesophageal reflux disease)    controlled w/ diet and behavioral changes  . Hypertriglyceridemia 03/16/2015  . Lymphadenitis   . Melanoma of back ([place])    Excised Dr Wilhemina Bonito  . New onset a-fib ([place]) 03/10/2015  . Otitis, externa, infective 08/10/2015  . Palpitations   . Post herpetic neuralgia   . Shingles 07/09/2013  . Sleep apnea 07/30/2011   cpap- 13   . Snoring disorder 07/30/2011  . White coat hypertension     PAST SURGICAL HISTORY: Past Surgical History:  Procedure Laterality Date  . CATARACT EXTRACTION Right   . EYE SURGERY Right 12/06/2017   cataract by Dr Gillian Scarce  . LEFT HEART CATHETERIZATION WITH CORONARY ANGIOGRAM N/A 04/10/2015   Procedure: LEFT HEART CATHETERIZATION WITH  CORONARY ANGIOGRAM;  Surgeon: Leonie Man, MD;  Location: [facility] CATH LAB;  Service: Cardiovascular;  Angiographicallly NORMAL CORONARY ARTERIES  .  MASS EXCISION N/A 05/16/2014   Procedure: EXCISION POSTERIOR NECK MASS, RIGHT CHEST WALL MASS AND RIGHT AXILLARY MASS AXILLARY NODE DISSECTION;  Surgeon: Adin Hector, MD;  Location: [facility] ORS;  Service: General;  Laterality: N/A;  . NM MYOVIEW LTD  03/19/2015   FALSE POSITIVE:  INTERMEDIATE RISK. Small sized, moderate intensity inferior ischemic perfusion defect  . TRANSTHORACIC ECHOCARDIOGRAM  03/20/2015   Normal LV size and function. EF 60-65%. G1 DD. Trivial AI. Borderline aortic root dilation (41 mm), Mild LA dilation.    SOCIAL HISTORY: Social History    Tobacco Use  . Smoking status: Former Smoker    Types: Pipe, Cigarettes    Last attempt to quit: 02/11/1989    Years since quitting: 29.8  . Smokeless tobacco: Never Used  Substance Use Topics  . Alcohol use: Yes    Alcohol/week: 7.0 standard drinks    Types: 7 Glasses of wine per week    Comment: 1 per day -- brandy or wine  . Drug use: No    FAMILY HISTORY: Family History  Problem Relation Age of Onset  . Cancer Mother        MM, leukemia  . Obesity Mother   . Emphysema Father   . Obesity Father   . Alcohol abuse Father   . Cancer Sister        brain  . COPD Brother   . Heart disease Paternal Grandmother   . COPD Paternal Grandfather   . Diabetes Sister   . Obesity Sister   . Down syndrome Brother   . Alcohol abuse Other   . Colon cancer Neg Hx     ROS: Review of Systems  Constitutional: Positive for malaise/fatigue and weight loss.  Gastrointestinal: Negative for nausea and vomiting.  Musculoskeletal:       Negative muscle weakness  Endo/Heme/Allergies:       Negative hypoglycemia    PHYSICAL EXAM: Blood pressure 104/70, pulse (!) 53, height 5\' 9"  (1.753 m), weight 206 lb (93.4 kg), SpO2 93 %. Body mass index is 30.42 kg/m. Physical Exam  Constitutional: He is oriented to person, place, and time. He appears well-developed and well-nourished.  Cardiovascular: Normal rate.  Pulmonary/Chest: Effort normal.  Musculoskeletal: Normal range of motion.  Neurological: He is oriented to person, place, and time.  Skin: Skin is warm and dry.  Psychiatric: He has a normal mood and affect. His behavior is normal.  Vitals reviewed.   RECENT LABS AND TESTS: BMET    Component Value Date/Time   NA 143 08/29/2018 1143   K 4.7 08/29/2018 1143   CL 104 08/29/2018 1143   CO2 24 08/29/2018 1143   GLUCOSE 88 08/29/2018 1143   GLUCOSE 85 12/07/2016 1531   BUN 19 08/29/2018 1143   CREATININE 0.99 08/29/2018 1143   CREATININE 0.92 04/04/2015 1113   CALCIUM 9.3  08/29/2018 1143   GFRNONAA 74 08/29/2018 1143   GFRAA 86 08/29/2018 1143   Lab Results  Component Value Date   HGBA1C 5.6 08/29/2018   HGBA1C 5.7 (H) 04/26/2018   HGBA1C 5.8 (H) 12/22/2017   HGBA1C 5.8 (H) 08/18/2017   Lab Results  Component Value Date   INSULIN 5.6 08/29/2018   INSULIN 6.4 04/26/2018   INSULIN 12.1 12/22/2017   INSULIN 17.6 08/18/2017   CBC    Component Value Date/Time   WBC 6.7 08/18/2017 1059   WBC 8.4 12/07/2016 1531   RBC 4.88 08/18/2017 1059   RBC 4.80 12/07/2016 1531   HGB 15.5  08/18/2017 1059   HCT 46.8 08/18/2017 1059   PLT 185.0 12/07/2016 1531   MCV 96 08/18/2017 1059   MCH 31.8 08/18/2017 1059   MCH 28.8 04/04/2015 1113   MCHC 33.1 08/18/2017 1059   MCHC 34.3 12/07/2016 1531   RDW 14.3 08/18/2017 1059   LYMPHSABS 1.7 08/18/2017 1059   MONOABS 0.7 09/13/2013 1350   EOSABS 0.1 08/18/2017 1059   BASOSABS 0.0 08/18/2017 1059   Iron/TIBC/Ferritin/ %Sat No results found for: IRON, TIBC, FERRITIN, IRONPCTSAT Lipid Panel     Component Value Date/Time   CHOL 131 04/26/2018 0902   TRIG 56 04/26/2018 0902   HDL 50 04/26/2018 0902   CHOLHDL 3 04/12/2016 1004   VLDL 21.0 04/12/2016 1004   LDLCALC 70 04/26/2018 0902   Hepatic Function Panel     Component Value Date/Time   PROT 6.6 08/29/2018 1143   ALBUMIN 4.1 08/29/2018 1143   AST 20 08/29/2018 1143   ALT 19 08/29/2018 1143   ALKPHOS 57 08/29/2018 1143   BILITOT 0.9 08/29/2018 1143   BILIDIR 0.1 02/11/2014 1016   IBILI 0.3 02/11/2014 1016      Component Value Date/Time   TSH 1.680 08/18/2017 1059   TSH 1.80 12/07/2016 1531   TSH 1.77 04/12/2016 1004  Results for Bauserman, Zuhayr ALDEN "MIKE" (MRN [id number]) as of 12/05/2018 15:42  Ref. Range 08/29/2018 11:43  Vitamin D, 25-Hydroxy Latest Ref Range: 30.0 - 100.0 ng/mL 61.0    ASSESSMENT AND PLAN: Prediabetes - Plan: Comprehensive metabolic panel, Hemoglobin A1c, Insulin, random  Vitamin D deficiency - Plan: VITAMIN D 25 Hydroxy  (Vit-D Deficiency, Fractures)  Other fatigue - Plan: T3, T4, free, TSH  Class 1 obesity with serious comorbidity and body mass index (BMI) of 30.0 to 30.9 in adult, unspecified obesity type  PLAN:  Pre-Diabetes Stan will continue to work on weight loss, diet, exercise, and decreasing simple carbohydrates in his diet to help decrease the risk of diabetes. We dicussed metformin including benefits and risks. He was informed that eating too many simple carbohydrates or too many calories at one sitting increases the likelihood of GI side effects. Currie declined metformin for now and a prescription was not written today. We will check labs and Zedric agrees to follow up with our clinic in 4 weeks as directed to monitor his progress.  Vitamin D Deficiency Miron was informed that low vitamin D levels contributes to fatigue and are associated with obesity, breast, and colon cancer. Dyshon agrees to continue taking OTC Vit D and will follow up for routine testing of vitamin D, at least 2-3 times per year. He was informed of the risk of over-replacement of vitamin D and agrees to not increase his dose unless he discusses this with [name] first. We will check labs and Tysheem agrees to follow up with our clinic in 4 weeks.  Fatigue Raidyn was informed that his fatigue may be related to obesity, depression or many other causes. We will check labs (thyroid) and we will follow up, and in the meanwhile Khoury will continue diet, exercise and weight loss to help with fatigue. Proper sleep hygiene was discussed including the need for 7-8 hours of quality sleep each night.  Obesity Kush is currently in the action stage of change. As such, his goal is to continue with weight loss efforts He has agreed to keep a food journal with 1700 calories and 100 grams of protein daily Wilmar has been instructed to work up to a goal of 150 minutes  of combined cardio and strengthening exercise per week for weight loss and overall health  benefits. We discussed the following Behavioral Modification Strategies today: work on meal planning and easy cooking plans, holiday eating strategies, celebration eating strategies, and better snacking choices   Kaesyn has agreed to follow up with our clinic in 4 weeks. He was informed of the importance of frequent follow up visits to maximize his success with intensive lifestyle modifications for his multiple health conditions.   OBESITY BEHAVIORAL INTERVENTION VISIT  Today's visit was # 23   Starting weight: 237 lbs Starting date: 08/18/17 Today's weight : 206 lbs  Today's date: 12/05/2018 Total lbs lost to date: 31 At least 15 minutes were spent on discussing the following behavioral intervention visit.   ASK: We discussed the diagnosis of obesity with [name] today and Jaxen agreed to give [name] permission to discuss obesity behavioral modification therapy today.  ASSESS: Cesario has the diagnosis of obesity and his BMI today is 30.41 Eamonn is in the action stage of change   ADVISE: Ascencion was educated on the multiple health risks of obesity as well as the benefit of weight loss to improve his health. He was advised of the need for long term treatment and the importance of lifestyle modifications to improve his current health and to decrease his risk of future health problems.  AGREE: Multiple dietary modification options and treatment options were discussed and  Uel agreed to follow the recommendations documented in the above note.  ARRANGE: Drystan was educated on the importance of frequent visits to treat obesity as outlined per CMS and USPSTF guidelines and agreed to schedule his next follow up appointment today.  I, Trixie Dredge, am acting as transcriptionist for Dennard Nip, MD  I have reviewed the above documentation for accuracy and completeness, and I agree with the above. -Dennard Nip, MD

## 2018-12-06 LAB — COMPREHENSIVE METABOLIC PANEL
ALBUMIN: 4.2 g/dL (ref 3.5–4.8)
ALT: 15 IU/L (ref 0–44)
AST: 17 IU/L (ref 0–40)
Albumin/Globulin Ratio: 1.8 (ref 1.2–2.2)
Alkaline Phosphatase: 57 IU/L (ref 39–117)
BILIRUBIN TOTAL: 0.9 mg/dL (ref 0.0–1.2)
BUN/Creatinine Ratio: 17 (ref 10–24)
BUN: 18 mg/dL (ref 8–27)
CHLORIDE: 105 mmol/L (ref 96–106)
CO2: 24 mmol/L (ref 20–29)
Calcium: 9.8 mg/dL (ref 8.6–10.2)
Creatinine, Ser: 1.04 mg/dL (ref 0.76–1.27)
GFR calc Af Amer: 81 mL/min/{1.73_m2} (ref >59)
GFR calc non Af Amer: 70 mL/min/{1.73_m2} (ref >59)
GLOBULIN, TOTAL: 2.3 g/dL (ref 1.5–4.5)
GLUCOSE: 83 mg/dL (ref 65–99)
POTASSIUM: 4.7 mmol/L (ref 3.5–5.2)
SODIUM: 145 mmol/L — AB (ref 134–144)
Total Protein: 6.5 g/dL (ref 6.0–8.5)

## 2018-12-06 LAB — T4, FREE: Free T4: 0.97 ng/dL (ref 0.82–1.77)

## 2018-12-06 LAB — TSH: TSH: 2.23 u[IU]/mL (ref 0.450–4.500)

## 2018-12-06 LAB — HEMOGLOBIN A1C
Est. average glucose Bld gHb Est-mCnc: 120 mg/dL
Hgb A1c MFr Bld: 5.8 % — ABNORMAL HIGH (ref 4.8–5.6)

## 2018-12-06 LAB — INSULIN, RANDOM: INSULIN: 6.7 u[IU]/mL (ref 2.6–24.9)

## 2018-12-06 LAB — T3: T3, Total: 84 ng/dL (ref 71–180)

## 2018-12-06 LAB — VITAMIN D 25 HYDROXY (VIT D DEFICIENCY, FRACTURES): Vit D, 25-Hydroxy: 58 ng/mL (ref 30.0–100.0)

## 2018-12-11 ENCOUNTER — Encounter: Payer: Self-pay | Admitting: Family Medicine

## 2018-12-11 ENCOUNTER — Ambulatory Visit (INDEPENDENT_AMBULATORY_CARE_PROVIDER_SITE_OTHER): Payer: Medicare Other | Admitting: Family Medicine

## 2018-12-11 VITALS — BP 90/70 | HR 72 | Temp 97.4°F | Resp 18 | Wt 216.0 lb

## 2018-12-11 DIAGNOSIS — R222 Localized swelling, mass and lump, trunk: Secondary | ICD-10-CM | POA: Diagnosis not present

## 2018-12-11 DIAGNOSIS — R739 Hyperglycemia, unspecified: Secondary | ICD-10-CM

## 2018-12-11 DIAGNOSIS — E559 Vitamin D deficiency, unspecified: Secondary | ICD-10-CM

## 2018-12-11 DIAGNOSIS — R1032 Left lower quadrant pain: Secondary | ICD-10-CM

## 2018-12-11 DIAGNOSIS — E781 Pure hyperglyceridemia: Secondary | ICD-10-CM

## 2018-12-11 DIAGNOSIS — I4819 Other persistent atrial fibrillation: Secondary | ICD-10-CM

## 2018-12-11 DIAGNOSIS — B0229 Other postherpetic nervous system involvement: Secondary | ICD-10-CM | POA: Diagnosis not present

## 2018-12-11 MED ORDER — GABAPENTIN 300 MG PO CAPS: 900.0000 mg | ORAL_CAPSULE | Freq: Three times a day (TID) | ORAL | 3 refills | Status: DC

## 2018-12-11 MED ORDER — DIAZEPAM 5 MG PO TABS: 5.0000 mg | ORAL_TABLET | Freq: Four times a day (QID) | ORAL | 1 refills | Status: DC | PRN

## 2018-12-11 NOTE — Assessment & Plan Note (Signed)
Supplement and monitor 

## 2018-12-11 NOTE — Patient Instructions (Addendum)
Environmental Working Group (EWG) for Veterinary [email or web address] Neuralgia Postherpetic neuralgia (PHN) is nerve pain that occurs after a shingles infection. Shingles is a painful rash that appears on one side of the body, usually on your trunk or face. Shingles is caused by the varicella-zoster virus. This is the same virus that causes chickenpox. In people who have had chickenpox, the virus can resurface years later and cause shingles. You may have PHN if you continue to have pain for 3 months after your shingles rash has gone away. PHN appears in the same area where you had the shingles rash. For most people, PHN goes away within 1 year. Getting a vaccination for shingles can prevent PHN. This vaccine is recommended for people older than 50. It may prevent shingles and may also lower your risk of PHN if you do get shingles. What are the causes? PHN is caused by damage to your nerves from the varicella-zoster virus. This damage makes your nerves overly sensitive. What increases the risk? Aging is the biggest risk factor for developing PHN. Most people who get PHN are older than 23. Other risk factors include:  Having very bad pain before your shingles rash starts.  Having a very bad rash.  Having shingles in the nerve that supplies your face and eye (trigeminal nerve).  What are the signs or symptoms? Pain is the main symptom of PHN. The pain is often very bad and may be described as stabbing, burning, or feeling like an electric shock. The pain may come and go or may be there all the time. Pain may be triggered by light touches on the skin or changes in temperature. You may have itching along with the pain. How is this diagnosed? Your health care provider may diagnose PHN based on your symptoms and your history of shingles. Lab studies and other diagnostic tests are usually not needed. How is this treated? There is no cure for PHN. Treatment for PHN will focus on pain  relief. Over-the-counter pain relievers do not usually relieve PHN pain. You may need to work with a pain specialist. Treatment may include:  Antidepressant medicines to help with pain and improve sleep.  Antiseizure medicines to relieve nerve pain.  Strong pain relievers (opioids).  A numbing patch worn on the skin (lidocaine patch).  Follow these instructions at home: It may take a long time to recover from PHN. Work closely with your health care provider, and have a good support system at home.  Take all medicines as directed by your health care provider.  Wear loose, comfortable clothing.  Cover sensitive areas with a dressing to reduce friction from clothing rubbing on the area.  If cold does not make your pain worse, try applying a cool compress or cooling gel pack to the area.  Talk to your health care provider if you feel depressed or desperate. Living with long-term pain can be depressing.  Contact a health care provider if:  Your medicine is not helping.  You are struggling to manage your pain at home. This information is not intended to replace advice given to you by your health care provider. Make sure you discuss any questions you have with your health care provider. Document Released: 03/05/2003 Document Revised: 05/20/2016 Document Reviewed: 12/04/2013 Elsevier Interactive Patient Education  [employer].

## 2018-12-11 NOTE — Assessment & Plan Note (Signed)
Encouraged heart healthy diet, increase exercise, avoid trans fats, consider a krill oil cap daily 

## 2018-12-11 NOTE — Assessment & Plan Note (Signed)
Worsening especially with cold weather, change in weather and if he misses a dose of Gabapentin. Will refer to neurology for further work up. Valium helps for break through pain.

## 2018-12-11 NOTE — Assessment & Plan Note (Signed)
hgba1c acceptable, minimize simple carbs. Increase exercise as tolerated.  

## 2018-12-11 NOTE — Assessment & Plan Note (Signed)
Intermittent and worse after prolonged standing. Last for minutes and resolves with resting. Has seen Dr Karsten Ro his urology and he believes he has an internal hernia and he is now set up for evaluation with general surgery Dr Johney Maine. If no improvement consider referral back to GI last colonoscopy 2014 not due for 5 more years. No bloody or tarry stool. Bowels moving well

## 2018-12-11 NOTE — Assessment & Plan Note (Signed)
Rate controlled, tolerating Eliquis 

## 2018-12-11 NOTE — Progress Notes (Signed)
Subjective:    Patient ID: [name] [name], male    DOB: Jun 11, 1941, 77 y.o.   MRN: [id number]  No chief complaint on file.   HPI Patient is in today for follow-up.  Overall he is doing well except for his persistent postherpetic neuralgia pain.  He notes it is been particularly bad over the last few months.  Gabapentin is helpful but only partially.  His wife accompanies him and confirms this is his greatest concern.  His other new concern is of some pelvic pain/left lower quadrant pain.  He has been seen by urology thinks he may have an internal hernia and they have referred him to general surgery for evaluation.  They do note he has pain that often improves after he is had a bowel movement.  No bloody or tarry stool no change in bowel habits no anorexia nausea or vomiting.  Uses Valium infrequently when pain is severe and that is helpful. Denies CP/palp/SOB/HA/congestion/fevers/GI or GU c/o. Taking meds as prescribed  Past Medical History:  Diagnosis Date  . BPH (benign prostatic hypertrophy)   . Bradycardia 12/30/2014   HR routinely in mid 40s-50s  . Coronary artery disease (CAD) excluded April 2016   False-positive nuclear stress test suggesting inferior ischemia  . Elevated PSA   . Encounter for Medicare annual wellness exam 09/22/2013   Sees Dr Wilhemina Bonito of Derm Sees Dr Silvano Rusk of Gastroenterology Sees Dr Kathie Rhodes of [facility] Sees Dr Susa Day of Optometry  . Finger pain, left 04/12/2016   4th   . GERD (gastroesophageal reflux disease)    controlled w/ diet and behavioral changes  . Hypertriglyceridemia 03/16/2015  . Lymphadenitis   . Melanoma of back ([place])    Excised Dr Wilhemina Bonito  . New onset a-fib ([place]) 03/10/2015  . Otitis, externa, infective 08/10/2015  . Palpitations   . Post herpetic neuralgia   . Shingles 07/09/2013  . Sleep apnea 07/30/2011   cpap- 13   . Snoring disorder 07/30/2011  . White coat hypertension     Past Surgical History:    Procedure Laterality Date  . CATARACT EXTRACTION Right   . EYE SURGERY Right 12/06/2017   cataract by Dr Gillian Scarce  . LEFT HEART CATHETERIZATION WITH CORONARY ANGIOGRAM N/A 04/10/2015   Procedure: LEFT HEART CATHETERIZATION WITH  CORONARY ANGIOGRAM;  Surgeon: Leonie Man, MD;  Location: [facility] CATH LAB;  Service: Cardiovascular;  Angiographicallly NORMAL CORONARY ARTERIES  . MASS EXCISION N/A 05/16/2014   Procedure: EXCISION POSTERIOR NECK MASS, RIGHT CHEST WALL MASS AND RIGHT AXILLARY MASS AXILLARY NODE DISSECTION;  Surgeon: Adin Hector, MD;  Location: [facility] ORS;  Service: General;  Laterality: N/A;  . NM MYOVIEW LTD  03/19/2015   FALSE POSITIVE:  INTERMEDIATE RISK. Small sized, moderate intensity inferior ischemic perfusion defect  . TRANSTHORACIC ECHOCARDIOGRAM  03/20/2015   Normal LV size and function. EF 60-65%. G1 DD. Trivial AI. Borderline aortic root dilation (41 mm), Mild LA dilation.    Family History  Problem Relation Age of Onset  . Cancer Mother        MM, leukemia  . Obesity Mother   . Emphysema Father   . Obesity Father   . Alcohol abuse Father   . Cancer Sister        brain  . COPD Brother   . Heart disease Paternal Grandmother   . COPD Paternal Grandfather   . Diabetes Sister   . Obesity Sister   . Down syndrome Brother   .  Alcohol abuse Other   . Colon cancer Neg Hx     Social History   Socioeconomic History  . Marital status: Married    Spouse name: [name]  . Number of children: 2  . Years of education: Not on file  . Highest education level: Not on file  Occupational History  . 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 Financial resource strain: Not on file  . Food insecurity:    Worry: Not on file    Inability: Not on file  . Transportation needs:    Medical: Not on file    Non-medical: Not on file  Tobacco Use  . Smoking status: Former Smoker    Types: Pipe, Cigarettes    Last attempt to quit: 02/11/1989    Years since  quitting: 29.8  . Smokeless tobacco: Never Used  Substance and Sexual Activity  . Alcohol use: Yes    Alcohol/week: 7.0 standard drinks    Types: 7 Glasses of wine per week    Comment: 1 per day -- brandy or wine  . Drug use: No  . 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 Physical activity:    Days per week: Not on file    Minutes per session: Not on file  . Stress: Not on file  Relationships  . Social connections:    Talks on phone: Not on file    Gets together: Not on file    Attends religious service: Not on file    Active member of club or organization: Not on file    Attends meetings of clubs or organizations: Not on file    Relationship status: Not on file  . Intimate partner violence:    Fear of current or ex partner: Not on file    Emotionally abused: Not on file    Physically abused: Not on file    Forced sexual activity: Not on file  Other Topics Concern  . Not on file  Social History Narrative   He is a married father of 2, and grandfather of 50. He has been married to his wife [name] for 51 years. He previously lived in [place] but has moved with his wife to [place], [place] to be close to his daughter [name] [name] and her family. 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 He quit smoking in 1990. He has 6-7 glasses of brandy or wine a week.    He exercises regularly with low impact aerobics 2 days a week and daily walks of 30-45 minutes. His exercise sessions or 60 minutes. He will do some type of exercise at least 7 days a week and is very active doing yard work and wood work.    Outpatient Medications Prior to Visit  Medication Sig Dispense Refill  . apixaban (ELIQUIS) 5 MG TABS tablet Take 1 tablet (5 mg total) by mouth 2 (two) times daily. 180 tablet 3  . Cholecalciferol (VITAMIN D3) 5000 units CAPS Take 1  capsule by mouth daily.    . Multiple Vitamin (MULTIVITAMIN WITH MINERALS) TABS tablet Take 1 tablet by mouth daily.    Marland Kitchen gabapentin (NEURONTIN) 300 MG capsule TAKE 3 CAPSULES BY MOUTH THREE TIMES DAILY 270 capsule 3   No facility-administered medications prior to visit.     No Known Allergies  Review of Systems  Constitutional: Negative for fever and malaise/fatigue.  HENT: Negative for congestion.   Eyes: Negative for blurred vision.  Respiratory: Negative for shortness of breath.   Cardiovascular: Negative for chest pain, palpitations and leg swelling.  Gastrointestinal: Negative for abdominal pain, blood in stool and nausea.  Genitourinary: Negative for dysuria and frequency.  Musculoskeletal: Positive for joint pain. Negative for falls.  Skin: Negative for rash.  Neurological: Negative for dizziness, loss of consciousness and headaches.  Endo/Heme/Allergies: Negative for environmental allergies.  Psychiatric/Behavioral: Negative for depression. The patient is not nervous/anxious.        Objective:    Physical Exam Vitals signs and nursing note reviewed.  Constitutional:      General: He is not in acute distress.    Appearance: He is well-developed.  HENT:     Head: Normocephalic and atraumatic.     Nose: Nose normal.  Eyes:     General:        Right eye: No discharge.        Left eye: No discharge.  Neck:     Musculoskeletal: Normal range of motion and neck supple.  Cardiovascular:     Rate and Rhythm: Normal rate. Rhythm irregular.     Heart sounds: No murmur.  Pulmonary:     Effort: Pulmonary effort is normal.     Breath sounds: Normal breath sounds.  Abdominal:     General: Bowel sounds are normal.     Palpations: Abdomen is soft.     Tenderness: There is no abdominal tenderness.  Skin:    General: Skin is warm and dry.  Neurological:     Mental Status: He is alert and oriented to person, place, and time.     BP 90/70 (BP Location: Left Arm, Patient  Position: Sitting, Cuff Size: Normal)   Pulse 72   Temp (!) 97.4 F (36.3 C) (Oral)   Resp 18   Wt 216 lb (98 kg)   SpO2 94%   BMI 31.90 kg/m  Wt Readings from Last 3 Encounters:  12/11/18 216 lb (98 kg)  12/05/18 206 lb (93.4 kg)  10/30/18 214 lb (97.1 kg)     Lab Results  Component Value Date   WBC 6.7 08/18/2017   HGB 15.5 08/18/2017   HCT 46.8 08/18/2017   PLT 185.0 12/07/2016   GLUCOSE 83 12/05/2018   CHOL 131 04/26/2018   TRIG 56 04/26/2018   HDL 50 04/26/2018   LDLCALC 70 04/26/2018   ALT 15 12/05/2018   AST 17 12/05/2018   NA 145 (H) 12/05/2018   K 4.7 12/05/2018   CL 105 12/05/2018   CREATININE 1.04 12/05/2018   BUN 18 12/05/2018   CO2 24 12/05/2018   TSH 2.230 12/05/2018   PSA 4.94 (H) 01/15/2010   INR 1.22 04/04/2015   HGBA1C 5.8 (H) 12/05/2018    Lab Results  Component Value Date   TSH 2.230 12/05/2018   Lab Results  Component Value Date   WBC 6.7 08/18/2017   HGB 15.5 08/18/2017   HCT 46.8 08/18/2017   MCV 96 08/18/2017   PLT 185.0 12/07/2016   Lab Results  Component Value Date   NA 145 (H) 12/05/2018   K 4.7 12/05/2018   CO2 24 12/05/2018   GLUCOSE 83 12/05/2018   BUN 18 12/05/2018   CREATININE 1.04 12/05/2018   BILITOT 0.9 12/05/2018   ALKPHOS 57 12/05/2018   AST 17 12/05/2018   ALT 15 12/05/2018   PROT 6.5  12/05/2018   ALBUMIN 4.2 12/05/2018   CALCIUM 9.8 12/05/2018   GFR 76.77 12/07/2016   Lab Results  Component Value Date   CHOL 131 04/26/2018   Lab Results  Component Value Date   HDL 50 04/26/2018   Lab Results  Component Value Date   LDLCALC 70 04/26/2018   Lab Results  Component Value Date   TRIG 56 04/26/2018   Lab Results  Component Value Date   CHOLHDL 3 04/12/2016   Lab Results  Component Value Date   HGBA1C 5.8 (H) 12/05/2018       Assessment & Plan:   Problem List Items Addressed This Visit    Persistent atrial fibrillation (Chronic)    Rate controlled, tolerating Eliquis       Hypertriglyceridemia (Chronic)    Encouraged heart healthy diet, increase exercise, avoid trans fats, consider a krill oil cap daily      Post herpetic neuralgia    Worsening especially with cold weather, change in weather and if he misses a dose of Gabapentin. Will refer to neurology for further work up. Valium helps for break through pain.       Relevant Orders   Ambulatory referral to Neurology   Chest wall mass - right SQ anterior 5cm - Primary   Vitamin D deficiency    Supplement and monitor      Hyperglycemia    hgba1c acceptable, minimize simple carbs. Increase exercise as tolerated.      Groin pain, left    Intermittent and worse after prolonged standing. Last for minutes and resolves with resting. Has seen Dr Karsten Ro his urology and he believes he has an internal hernia and he is now set up for evaluation with general surgery Dr Johney Maine. If no improvement consider referral back to GI last colonoscopy 2014 not due for 5 more years. No bloody or tarry stool. Bowels moving well         I have changed Aedan A. Flannery "Mike"'s diazepam and gabapentin. I am also having him maintain his multivitamin with minerals, Vitamin D3, and apixaban.  Meds ordered this encounter  Medications  . diazepam (VALIUM) 5 MG tablet    Sig: Take 1 tablet (5 mg total) by mouth every 6 (six) hours as needed for anxiety.    Dispense:  30 tablet    Refill:  1  . gabapentin (NEURONTIN) 300 MG capsule    Sig: Take 3 capsules (900 mg total) by mouth 3 (three) times daily.    Dispense:  270 capsule    Refill:  3    Please consider 90 day supplies to promote better adherence     [name], MD

## 2018-12-13 NOTE — Progress Notes (Signed)
NEUROLOGY CONSULTATION NOTE  [name] [name] MRN: [id number] DOB: 11-26-1941  Referring provider: [name], MD Primary care provider: [name], MD  Reason for consult:  Post-herpetic neuralgia  HISTORY OF PRESENT ILLNESS: [name] [name] is a 77 year old right-handed male with atrial fibrillation on AC, hyperglycemia, hypertriglyceridemia, and BPH who presents for post-herpetic neuralgia.  He is accompanied by his wife who supplements history.  History is also supplemented by referring provider's notes.  He developed post-herpetic neuralgia in 2014 after developing shingles involving the right upper thoracic region wrapping under his right arm.  It feels like a stabbing metal wire into his skin.  It is intermittent.  It is daily.  It is worse in the evenings.  Change in weather or movement aggravates it.  He finds that the gabapentin and heating pad are helpful.     Current management:  Gabapentin 900mg  three times daily; diazepam 5mg  every 6 hours as needed for breakthrough pain.  Past medications:  Cymbalta (erectile dysfunction), Lyrica (on a month, but stopped because he was also taking gabapentin), capsaicin cream, lidocaine gel, thoracic nerve block, acupuncture  CMP from this month was unremarkable.  PAST MEDICAL HISTORY: Past Medical History:  Diagnosis Date  . BPH (benign prostatic hypertrophy)   . Bradycardia 12/30/2014   HR routinely in mid 40s-50s  . Coronary artery disease (CAD) excluded April 2016   False-positive nuclear stress test suggesting inferior ischemia  . Elevated PSA   . Encounter for Medicare annual wellness exam 09/22/2013   Sees Dr [name] [name] of Derm Sees Dr [name] [name] of Gastroenterology Sees Dr [name] [name] of [facility] Sees Dr [name] [name] of Optometry  . Finger pain, left 04/12/2016   4th   . GERD (gastroesophageal reflux disease)    controlled w/ diet and behavioral changes  . Hypertriglyceridemia 03/16/2015  . Lymphadenitis     . Melanoma of back ([place])    Excised Dr [name] [name]  . New onset a-fib ([place]) 03/10/2015  . Otitis, externa, infective 08/10/2015  . Palpitations   . Post herpetic neuralgia   . Shingles 07/09/2013  . Sleep apnea 07/30/2011   cpap- 13   . Snoring disorder 07/30/2011  . White coat hypertension     PAST SURGICAL HISTORY: Past Surgical History:  Procedure Laterality Date  . CATARACT EXTRACTION Right   . EYE SURGERY Right 12/06/2017   cataract by Dr [name] [name]  . LEFT HEART CATHETERIZATION WITH CORONARY ANGIOGRAM N/A 04/10/2015   Procedure: LEFT HEART CATHETERIZATION WITH  CORONARY ANGIOGRAM;  Surgeon: [name] Man, MD;  Location: [facility] CATH LAB;  Service: Cardiovascular;  Angiographicallly NORMAL CORONARY ARTERIES  . MASS EXCISION N/A 05/16/2014   Procedure: EXCISION POSTERIOR NECK MASS, RIGHT CHEST WALL MASS AND RIGHT AXILLARY MASS AXILLARY NODE DISSECTION;  Surgeon: [name] Hector, MD;  Location: [facility];  Service: General;  Laterality: N/A;  . NM MYOVIEW LTD  03/19/2015   FALSE POSITIVE:  INTERMEDIATE RISK. Small sized, moderate intensity inferior ischemic perfusion defect  . TRANSTHORACIC ECHOCARDIOGRAM  03/20/2015   Normal LV size and function. EF 60-65%. G1 DD. Trivial AI. Borderline aortic root dilation (41 mm), Mild LA dilation.    MEDICATIONS: Current Outpatient Medications on File Prior to Visit  Medication Sig Dispense Refill  . apixaban (ELIQUIS) 5 MG TABS tablet Take 1 tablet (5 mg total) by mouth 2 (two) times daily. 180 tablet 3  . Cholecalciferol (VITAMIN D3) 5000 units CAPS Take 1 capsule by mouth daily.    Marland Kitchen  diazepam (VALIUM) 5 MG tablet Take 1 tablet (5 mg total) by mouth every 6 (six) hours as needed for anxiety. 30 tablet 1  . gabapentin (NEURONTIN) 300 MG capsule Take 3 capsules (900 mg total) by mouth 3 (three) times daily. 270 capsule 3  . Multiple Vitamin (MULTIVITAMIN WITH MINERALS) TABS tablet Take 1 tablet by mouth daily.     No current facility-administered medications  on file prior to visit.     ALLERGIES: No Known Allergies  FAMILY HISTORY: Family History  Problem Relation Age of Onset  . Cancer Mother        MM, leukemia  . Obesity Mother   . Emphysema Father   . Obesity Father   . Alcohol abuse Father   . Cancer Sister        brain  . COPD Brother   . Heart disease Paternal Grandmother   . COPD Paternal Grandfather   . Diabetes Sister   . Obesity Sister   . Down syndrome Brother   . Alcohol abuse Other   . Colon cancer Neg Hx    SOCIAL HISTORY: Social History   Socioeconomic History  . Marital status: Married    Spouse name: [name] [name]  . Number of children: 2  . Years of education: Not on file  . Highest education level: Not on file  Occupational History  . 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 Financial resource strain: Not on file  . Food insecurity:    Worry: Not on file    Inability: Not on file  . Transportation needs:    Medical: Not on file    Non-medical: Not on file  Tobacco Use  . Smoking status: Former Smoker    Types: Pipe, Cigarettes    Last attempt to quit: 02/11/1989    Years since quitting: 29.8  . Smokeless tobacco: Never Used  Substance and Sexual Activity  . Alcohol use: Yes    Alcohol/week: 7.0 standard drinks    Types: 7 Glasses of wine per week    Comment: 1 per [name] -- brandy or wine  . Drug use: No  . 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 Physical activity:    Days per week: Not on file    Minutes per session: Not on file  . Stress: Not on file  Relationships  . Social connections:    Talks on phone: Not on file    Gets together: Not on file    Attends religious service: Not on file    Active member of club or organization: Not on file    Attends meetings of clubs or organizations: Not on file    Relationship status: Not on file  . Intimate partner violence:    Fear of current or ex  partner: Not on file    Emotionally abused: Not on file    Physically abused: Not on file    Forced sexual activity: Not on file  Other Topics Concern  . Not on file  Social History Narrative   He is a married father of 2, and grandfather of 25. He has been married to his wife [name] [name] for 51 years. He previously lived in [place] but has moved with his wife to [place], [place] to be close to his daughter [name] [name] and her family. [name] [name] is our clinical pharmacist.   He previously worked as a Administrator  and had 2 years of college after high school. He quit smoking in 1990. He has 6-7 glasses of brandy or wine a week.    He exercises regularly with low impact aerobics 2 days a week and daily walks of 30-45 minutes. His exercise sessions or 60 minutes. He will do some type of exercise at least 7 days a week and is very active doing yard work and wood work.    REVIEW OF SYSTEMS: Constitutional: No fevers, chills, or sweats, no generalized fatigue, change in appetite Eyes: No visual changes, double vision, eye pain Ear, nose and throat: No hearing loss, ear pain, nasal congestion, sore throat Cardiovascular: No chest pain, palpitations Respiratory:  No shortness of breath at rest or with exertion, wheezes GastrointestinaI: No nausea, vomiting, diarrhea, abdominal pain, fecal incontinence Genitourinary:  No dysuria, urinary retention or frequency Musculoskeletal:  No neck pain, back pain Integumentary: No rash, pruritus, skin lesions Neurological: as above Psychiatric: No depression, insomnia, anxiety Endocrine: No palpitations, fatigue, diaphoresis, mood swings, change in appetite, change in weight, increased thirst Hematologic/Lymphatic:  No purpura, petechiae. Allergic/Immunologic: no itchy/runny eyes, nasal congestion, recent allergic reactions, rashes  PHYSICAL EXAM: Blood pressure 100/64, pulse 67, height 5\' 10"  (1.778 m), weight 216 lb (98 kg), SpO2 100 %. General: No acute distress.   Patient appears well-groomed.   Head:  Normocephalic/atraumatic Eyes:  fundi examined but not visualized Neck: supple, no paraspinal tenderness, full range of motion Back: No paraspinal tenderness Heart: regular rate and rhythm Lungs: Clear to auscultation bilaterally. Vascular: No carotid bruits. Neurological Exam: Mental status: alert and oriented to person, place, and time, recent and remote memory intact, fund of knowledge intact, attention and concentration intact, speech fluent and not dysarthric, language intact. Cranial nerves: CN I: not tested CN II: pupils equal, round and reactive to light, visual fields intact CN III, IV, VI:  full range of motion, no nystagmus, no ptosis CN V: facial sensation intact CN VII: upper and lower face symmetric CN VIII: hearing intact CN IX, X: gag intact, uvula midline CN XI: sternocleidomastoid and trapezius muscles intact CN XII: tongue midline Bulk & Tone: normal, no fasciculations. Motor:  5/5 throughout  Sensation:  Pinprick and vibration sensation intact.   Deep Tendon Reflexes:  2+ throughout, toes downgoing.   Finger to nose testing:  Without dysmetria.   Heel to shin:  Without dysmetria.   Gait:  Normal station and stride.  Romberg negative.  IMPRESSION: Post herpetic neuralgia.  He was previously on Lyrica for just a month or two, so it was never optimized.   PLAN: We will restart Lyrica and slowly taper down (and hopefully off) gabapentin.  If on optimal dose of Lyrica with continued pain, we can always restart/titrate gabapentin up again. 1.  Start Lyrica 75mg  twice daily 2.  Taper down gabapentin by 300mg  every week to goal of 600mg  three times daily 3.  If pain not improved in 6 weeks, he will contact me and we can increase Lyrica to 150mg  twice daily  4.  Follow up in 3 to 4 months.  Thank you for allowing me to take part in the care of this patient.  Metta Clines, DO  CC: [name] Homans, MD

## 2018-12-14 ENCOUNTER — Ambulatory Visit (INDEPENDENT_AMBULATORY_CARE_PROVIDER_SITE_OTHER): Payer: Medicare Other | Admitting: Neurology

## 2018-12-14 ENCOUNTER — Encounter: Payer: Self-pay | Admitting: Neurology

## 2018-12-14 VITALS — BP 100/64 | HR 67 | Ht 70.0 in | Wt 216.0 lb

## 2018-12-14 DIAGNOSIS — B0229 Other postherpetic nervous system involvement: Secondary | ICD-10-CM | POA: Diagnosis not present

## 2018-12-14 MED ORDER — PREGABALIN 75 MG PO CAPS: 75.0000 mg | ORAL_CAPSULE | Freq: Two times a day (BID) | ORAL | 3 refills | Status: DC

## 2018-12-14 NOTE — Patient Instructions (Signed)
1.  We will restart Lyrica 75mg  twice daily. 2.  At the same time, we will slowly reduce dose of gabapentin:  Take 3 capsules in morning, 2 capsules in afternoon and 3 capsules at night for one week  Then 2 capsules in morning, 2 capsules in afternoon and 3 capsules at night for one week  Then 2 capsules three times daily. 3.  Contact me in 6 weeks with update and we can increase dose of Lyrica if needed (and further reduce dose of gabapentin) 4.  Follow up in 3 to 4 months.

## 2019-01-02 ENCOUNTER — Telehealth: Payer: Self-pay | Admitting: *Deleted

## 2019-01-02 ENCOUNTER — Ambulatory Visit: Payer: Self-pay | Admitting: Surgery

## 2019-01-02 DIAGNOSIS — Z7901 Long term (current) use of anticoagulants: Secondary | ICD-10-CM | POA: Diagnosis not present

## 2019-01-02 DIAGNOSIS — K4090 Unilateral inguinal hernia, without obstruction or gangrene, not specified as recurrent: Secondary | ICD-10-CM | POA: Diagnosis not present

## 2019-01-02 DIAGNOSIS — I482 Chronic atrial fibrillation, unspecified: Secondary | ICD-10-CM | POA: Diagnosis not present

## 2019-01-02 DIAGNOSIS — Z01818 Encounter for other preprocedural examination: Secondary | ICD-10-CM | POA: Diagnosis not present

## 2019-01-02 NOTE — H&P (Signed)
[name] [name] Documented: 01/02/2019 10:32 AM Location: [facility] Patient #: [id number] DOB: [birth year] Married / Language: [name] [name] / Race: White Male  History of Present Illness [name] Hector MD; 01/02/2019 11:04 AM) The patient is a 78 year old male who presents with inguinal pain. Note for "Inguinal pain": ` ` ` Patient sent for surgical consultation at the request of Dr [name] [name], [name] [name]  Chief Complaint: Left groin pain. Possible hernia. ` ` [name] whose had some left groin pain for quite some time. Was sent to [name]. Evaluation argued against any postvasectomy pain or any scrotal issues. No definite hernia felt that wish to offer second opinion since the history seen to be quite suspicious. I excised some lipomas off his right trunk and neck 5 years ago.  Patient notes he gets a sharp left groin pain usually with prolonged standing. Sometimes during twisting. Usually relieved if he lies down or sits. No major problems urination or defecation. Moves his bowels every other day a few times. He history vasectomy in the 1970s. As it was a persistent nagging problem and discuss with his primary care physician. They wondered if he perhaps had an irritated spermatic cord send him to [name]. They felt otherwise. Patient can walk 30 minutes without difficulty. He has episodes of intermittent atrial fibrillation is chronic antiquated on Eliquis. Follow by Dr. Ellyn [name]. No major cardiac issues. He does have some chronic pain issues for which she controls with Lyrica and Neurontin. He's not had any other abdominal surgeries. He comes today with his wife. He thinks is only minimal month but she thinks it's been symptoms for several months. He's never really noticed a bulge but definitely positional pain. Not related to eating or bowel movements. No major issue with straining   (Review of systems as stated in this history (HPI) or  in the review of systems. Otherwise all other 12 point ROS are negative) ` ` `   Diagnostic Studies History [name] [name], [place]; 01/02/2019 10:36 AM) Colonoscopy 5-10 years ago  Allergies [name] [name], [place]; 01/02/2019 10:33 AM) No Known Drug Allergies [01/02/2019]:  Medication History ([name] [name], CMA; 01/02/2019 10:34 AM) Eliquis (5MG  Tablet, Oral) Active. Gabapentin (300MG  Capsule, Oral) Active. Lyrica (75MG  Capsule, Oral) Active. Vitamin D (Cholecalciferol) (1000UNIT Capsule, Oral) Active. Multivitamin Adult (Oral) Active. Valium (5MG  Tablet, Oral) Active. Medications Reconciled  Social History [name] [name], [place]; 01/02/2019 10:36 AM) Alcohol use Moderate alcohol use. No caffeine use No drug use Tobacco use Former smoker.  Family History [name] [name], [place]; 01/02/2019 10:36 AM) Alcohol Abuse Father, Sister. Cancer Mother, Sister. Respiratory Condition Father.  Other Problems [name] [name], [place]; 01/02/2019 10:36 AM) Atrial Fibrillation Enlarged Prostate Kidney Stone Sleep Apnea     Review of Systems [name] [name] RMA; 01/02/2019 10:36 AM) General Not Present- Appetite Loss, Chills, Fatigue, Fever, Night Sweats, Weight Gain and Weight Loss. Skin Not Present- Change in Wart/Mole, Dryness, Hives, Jaundice, New Lesions, Non-Healing Wounds, Rash and Ulcer. HEENT Present- Wears glasses/contact lenses. Not Present- Earache, Hearing Loss, Hoarseness, Nose Bleed, Oral Ulcers, Ringing in the Ears, Seasonal Allergies, Sinus Pain, Sore Throat, Visual Disturbances and Yellow Eyes. Respiratory Not Present- Bloody sputum, Chronic Cough, Difficulty Breathing, Snoring and Wheezing. Breast Not Present- Breast Mass, Breast Pain, Nipple Discharge and Skin Changes. Cardiovascular Not Present- Chest Pain, Difficulty Breathing Lying Down, Leg Cramps, Palpitations, Rapid Heart Rate, Shortness of Breath and Swelling of Extremities. Gastrointestinal Not Present- Abdominal  Pain, Bloating, Bloody Stool, Change in Bowel  Habits, Chronic diarrhea, Constipation, Difficulty Swallowing, Excessive gas, Gets full quickly at meals, Hemorrhoids, Indigestion, Nausea, Rectal Pain and Vomiting. Male Genitourinary Present- Urgency. Not Present- Blood in Urine, Change in Urinary Stream, Frequency, Impotence, Nocturia, Painful Urination and Urine Leakage.  Vitals ([name] [name] CMA; 01/02/2019 10:33 AM) 01/02/2019 10:32 AM Weight: 217 lb Height: 70in Body Surface Area: 2.16 m Body Mass Index: 31.14 kg/m  Temp.: 97.6F  Pulse: 75 (Regular)  P.OX: 98% (Room air) BP: 120/90 (Sitting, Left Arm, Standard)      Physical Exam [name] Hector MD; 01/02/2019 11:07 AM)  General Mental Status-Alert. General Appearance-Not in acute distress, Not Sickly. Orientation-Oriented X3. Hydration-Well hydrated. Voice-Normal.  Integumentary Global Assessment Upon inspection and palpation of skin surfaces of the - Axillae: non-tender, no inflammation or ulceration, no drainage. and Distribution of scalp and body hair is normal. General Characteristics Temperature - normal warmth is noted.  Head and Neck Head-normocephalic, atraumatic with no lesions or palpable masses. Face Global Assessment - atraumatic, no absence of expression. Neck Global Assessment - no abnormal movements, no bruit auscultated on the right, no bruit auscultated on the left, no decreased range of motion, non-tender. Trachea-midline. Thyroid Gland Characteristics - non-tender.  Eye Eyeball - Left-Extraocular movements intact, No Nystagmus. Eyeball - Right-Extraocular movements intact, No Nystagmus. Cornea - Left-No Hazy. Cornea - Right-No Hazy. Sclera/Conjunctiva - Left-No scleral icterus, No Discharge. Sclera/Conjunctiva - Right-No scleral icterus, No Discharge. Pupil - Left-Direct reaction to light normal. Pupil - Right-Direct reaction to light  normal.  ENMT Ears Pinna - Left - no drainage observed, no generalized tenderness observed. Right - no drainage observed, no generalized tenderness observed. Nose and Sinuses External Inspection of the Nose - no destructive lesion observed. Inspection of the nares - Left - quiet respiration. Right - quiet respiration. Mouth and Throat Lips - Upper Lip - no fissures observed, no pallor noted. Lower Lip - no fissures observed, no pallor noted. Nasopharynx - no discharge present. Oral Cavity/Oropharynx - Tongue - no dryness observed. Oral Mucosa - no cyanosis observed. Hypopharynx - no evidence of airway distress observed.  Chest and Lung Exam Inspection Movements - Normal and Symmetrical. Accessory muscles - No use of accessory muscles in breathing. Palpation Palpation of the chest reveals - Non-tender. Auscultation Breath sounds - Normal and Clear.  Cardiovascular Auscultation Rhythm - Regular. Murmurs & Other Heart Sounds - Auscultation of the heart reveals - No Murmurs and No Systolic Clicks.  Abdomen Inspection Inspection of the abdomen reveals - No Visible peristalsis and No Abnormal pulsations. Umbilicus - No Bleeding, No Urine drainage. Palpation/Percussion Palpation and Percussion of the abdomen reveal - Soft, Non Tender, No Rebound tenderness, No Rigidity (guarding) and No Cutaneous hyperesthesia. Note: Abdomen soft. Mild supraumbilical diastases recti. Nontender. Not distended. No umbilical or incisional hernias. No guarding.  Male Genitourinary Sexual Maturity Tanner 5 - Adult hair pattern and Adult penile size and shape. Note: Normal external male genitalia. Right testicle lower than left. Testicles epididymides and cords otherwise normal with no major abnormalities.  Definite persistent impulse with Valsalva at left external ring while standing consistent with small indirect inguinal hernia. That does reproduce his pain and discomfort. No definite hernia on the  right side.  Peripheral Vascular Upper Extremity Inspection - Left - No Cyanotic nailbeds, Not Ischemic. Right - No Cyanotic nailbeds, Not Ischemic.  Neurologic Neurologic evaluation reveals -normal attention span and ability to concentrate, able to name objects and repeat phrases. Appropriate fund of knowledge , normal sensation and normal coordination.  Mental Status Affect - not angry, not paranoid. Cranial Nerves-Normal Bilaterally. Gait-Normal.  Neuropsychiatric Mental status exam performed with findings of-able to articulate well with normal speech/language, rate, volume and coherence, thought content normal with ability to perform basic computations and apply abstract reasoning and no evidence of hallucinations, delusions, obsessions or homicidal/suicidal ideation.  Musculoskeletal Global Assessment Spine, Ribs and Pelvis - no instability, subluxation or laxity. Right Upper Extremity - no instability, subluxation or laxity.  Lymphatic Head & Neck  General Head & Neck Lymphatics: Bilateral - Description - No Localized lymphadenopathy. Axillary  General Axillary Region: Bilateral - Description - No Localized lymphadenopathy. Femoral & Inguinal  Generalized Femoral & Inguinal Lymphatics: Left - Description - No Localized lymphadenopathy. Right - Description - No Localized lymphadenopathy.    Assessment & Plan [name] Hector MD; 01/02/2019 11:02 AM)  LEFT INGUINAL HERNIA (K40.90) Definite LIH.  Lap exploration & repair  PREOP - ING HERNIA - ENCOUNTER FOR PREOPERATIVE EXAMINATION FOR GENERAL SURGICAL PROCEDURE (Z01.818)  Current Plans You are being scheduled for surgery- Our schedulers will call you.  You should hear from our office's scheduling department within 5 working days about the location, date, and time of surgery. We try to make accommodations for patient's preferences in scheduling surgery, but sometimes the OR schedule or the surgeon's schedule  prevents [name] from making those accommodations.  If you have not heard from our office [phone]) in 5 working days, call the office and ask for your surgeon's nurse.  If you have other questions about your diagnosis, plan, or surgery, call the office and ask for your surgeon's nurse.  Written instructions provided The anatomy & physiology of the abdominal wall and pelvic floor was discussed. The pathophysiology of hernias in the inguinal and pelvic region was discussed. Natural history risks such as progressive enlargement, pain, incarceration, and strangulation was discussed. Contributors to complications such as smoking, obesity, diabetes, prior surgery, etc were discussed.  I feel the risks of no intervention will lead to serious problems that outweigh the operative risks; therefore, I recommended surgery to reduce and repair the hernia. I explained laparoscopic techniques with possible need for an open approach. I noted usual use of mesh to patch and/or buttress hernia repair  Risks such as bleeding, infection, abscess, need for further treatment, heart attack, death, and other risks were discussed. I noted a good likelihood this will help address the problem. Goals of post-operative recovery were discussed as well. Possibility that this will not correct all symptoms was explained. I stressed the importance of low-impact activity, aggressive pain control, avoiding constipation, & not pushing through pain to minimize risk of post-operative chronic pain or injury. Possibility of reherniation was discussed. We will work to minimize complications.  An educational handout further explaining the pathology & treatment options was given as well. Questions were answered. The patient expresses understanding & wishes to proceed with surgery.  Pt Education - Pamphlet Given - Laparoscopic Hernia Repair: discussed with patient and provided information. Pt Education - [facility] Pain Control  (Zakarie Sturdivant) Pt Education - [facility] Hernia Post-Op HCI (Deniz Eskridge): discussed with patient and provided information. Pt Education - [facility] Mesh education: discussed with patient and provided information.  CHRONIC A-FIB (I48.20)  Current Plans I recommended obtaining preoperative cardiac clearance for recommendations on management of anticoagualtion perioperatively:  1. Timing of holding anticoagulation 2. Need for any bridge therapy (SQ enoxaparin, IV heparin, IV Aggrastat, etc) preop/postop. 3. Desired timing of resumption of anticoagulation.  In general from Dr. Johney Maine' standpoint:  Aspirin is okay to continue perioperatively (81mg  or 325mg ) & does not need to be held  Hold warfarin 5 dayspreoperatively. Consider PT/INR level on arrival to short stay the day of surgery. No need to check PT/INR level on preop visit  Hold P2Y12 inibitors such as clopidrogel (Plavix) 4 dayspreoperatively  Hold direct thrombin / factor Xa inhibitors (Xerelto, Pradaxa, Eliquis, etc) 2 dayspreoperatively   Request clearance by cardiology to better assess operative risk & see if a reevaluation, further workup, etc is needed. Also recommendations on how medications such as for anticoagulation and blood pressure should be managed/held/restarted after surgery.   ANTICOAGULATED (Z79.01)

## 2019-01-02 NOTE — Telephone Encounter (Signed)
   [facility] Pre-operative Risk Assessment    Request for surgical clearance:  1. What type of surgery is being performed?  HERNIA REPAIR  2. When is this surgery scheduled? TBD  3. What type of clearance is required (medical clearance vs. Pharmacy clearance to hold med vs. Both)?MEDICAL  4. Are there any medications that need to be held prior to surgery and how long? ELIQUIS  5. Practice name and name of physician performing surgery?[facility]; DR Remo Lipps [place]  6. What is your office phone number [phone]    7.   What is your office fax number  [phone] --ATTN : [name]  8.   Anesthesia type (None, local, MAC, general) ? GENERAL   [name] [name] 01/02/2019, 1:56 PM  _________________________________________________________________   (provider comments below)

## 2019-01-02 NOTE — Telephone Encounter (Signed)
Will forward to [facility] to address Eliquis. Then patient will need phone call to assess for clinical change since last OV. Richardson Dopp, PA-C    01/02/2019 4:33 PM

## 2019-01-03 NOTE — Telephone Encounter (Signed)
Patient with diagnosis of Afib on Eliquis for anticoagulation.    Procedure: hernia repair Date of procedure: TBD  CHADS2-VASc score of  3 (CHF, HTN, AGE, DM2, stroke/tia x 2, CAD, AGE, male)  CrCl 70ml/min  Per office protocol, patient can hold Eliquis for 1-2 days prior to procedure.

## 2019-01-03 NOTE — Telephone Encounter (Signed)
   Primary Cardiologist: Glenetta Hew, MD  Chart reviewed as part of pre-operative protocol coverage. Patient was contacted 01/03/2019 in reference to pre-operative risk assessment for pending surgery as outlined below.  Sly Parlee was last seen on 10/10/18 by Dr. Ellyn Hack.  Since that day, Tali Coster has done well. He is followed for chronic afib, rate controlled on no rate control medications; on Eliquis for stroke risk reduction. No known CAD.   Therefore, based on ACC/AHA guidelines, the patient would be at acceptable risk for the planned procedure without further cardiovascular testing.   According to our pharmacy protocol:   Patient with diagnosis of Afib on Eliquis for anticoagulation.    Procedure: hernia repair Date of procedure: TBD  CHADS2-VASc score of  3 (CHF, HTN, AGE, DM2, stroke/tia x 2, CAD, AGE, male) CrCl 65ml/min  Per office protocol, patient can hold Eliquis for 1-2 days prior to procedure.         I will route this recommendation to the requesting party via Epic fax function and remove from pre-op pool.  Please call with questions.  Daune Perch, NP 01/03/2019, 3:36 PM

## 2019-01-08 ENCOUNTER — Encounter (INDEPENDENT_AMBULATORY_CARE_PROVIDER_SITE_OTHER): Payer: Self-pay | Admitting: Family Medicine

## 2019-01-08 ENCOUNTER — Ambulatory Visit (INDEPENDENT_AMBULATORY_CARE_PROVIDER_SITE_OTHER): Payer: Medicare Other | Admitting: Family Medicine

## 2019-01-08 VITALS — BP 100/66 | HR 79 | Temp 97.6°F | Ht 69.0 in | Wt 208.0 lb

## 2019-01-08 DIAGNOSIS — R7303 Prediabetes: Secondary | ICD-10-CM

## 2019-01-08 DIAGNOSIS — E669 Obesity, unspecified: Secondary | ICD-10-CM

## 2019-01-08 DIAGNOSIS — E559 Vitamin D deficiency, unspecified: Secondary | ICD-10-CM

## 2019-01-08 DIAGNOSIS — Z6830 Body mass index (BMI) 30.0-30.9, adult: Secondary | ICD-10-CM | POA: Diagnosis not present

## 2019-01-08 NOTE — Progress Notes (Signed)
Office: [phone]  /  Fax: [phone]   HPI:   Chief Complaint: OBESITY [name] [name] is here to discuss his progress with his obesity treatment plan. He is on the keep a food journal with 1700 calories and 100 grams of  protein  and is following his eating plan approximately 75 % of the time. He states he is exercising 60 minutes 2 times per week. [name] [name] did well with minimizing weight gain over the holidays and over his birthday. He has already gotten back on track with his diet and is busy with cabinet making in his workshop in addition to his exercise.  His weight is 208 lb (94.3 kg) today and has a a weight gain of 2 lbs since his last visit. He has lost 29 lbs since starting treatment with [name].  Pre-Diabetes [name] [name] has a diagnosis of prediabetes based on his elevated Hgb A1c and was informed this puts him at greater risk of developing diabetes. He is attempting to diet control without taking metformin and continues to work on diet and exercise to decrease risk of diabetes. His A1C was slightly elevated over the holidays to 5 8.  He is ready to get back on track. He denies nausea or hypoglycemia.  Vitamin D deficiency [name] [name] has a diagnosis of vitamin D deficiency. He is currently taking OTC Vit D and is at goal. His fatigue has mostly resolved. He and denies nausea, vomiting or muscle weakness.     ASSESSMENT AND PLAN:  Prediabetes  Vitamin D deficiency  Class 1 obesity with serious comorbidity and body mass index (BMI) of 30.0 to 30.9 in adult, unspecified obesity type  PLAN:  Pre-Diabetes [name] [name] will continue to work on weight loss, exercise, and decreasing simple carbohydrates in his diet to help decrease the risk of diabetes. We dicussed metformin including benefits and risks. He was informed that eating too many simple carbohydrates or too many calories at one sitting increases the likelihood of GI side effects. [name] [name] declined metformin for now and a prescription was not written  today. He agreed to reduce simple carbohydrates and increase exercise. We will recheck labs in 2 months. [name] [name] agrees to follow up with our clinic in 6-8 weeks.  Vitamin D Deficiency [name] [name] was informed that low vitamin D levels contributes to fatigue and are associated with obesity, breast, and colon cancer. He agrees to continue to take prescription OTC Vit 5000 units daily and is at goal. [name] [name] will follow up for routine testing of vitamin D, at least 2-3 times per year. He was informed of the risk of over-replacement of vitamin D and agrees to not increase his dose unless he discusses this with [name] first. [name] [name] agrees to follow up with our clinic in 6-8 weeks.     I spent > than 50% of the 25 minute visit on counseling as documented in the note.  Obesity [name] [name] is currently in the action stage of change. As such, his goal is to continue with weight loss efforts He has agreed to keep a food journal with 1700 calories and 100+ grams of protein  [name] [name] has been instructed to work up to a goal of 150 minutes of combined cardio and strengthening exercise per week for weight loss and overall health benefits. We discussed the following Behavioral Modification Strategies today: increasing lean protein intake, decreasing simple carbohydrates  and work on meal planning and easy cooking plans  [name] [name] has agreed to follow up with our clinic in 6-8 weeks. He was informed  of the importance of frequent follow up visits to maximize his success with intensive lifestyle modifications for his multiple health conditions.  ALLERGIES: No Known Allergies  MEDICATIONS: Current Outpatient Medications on File Prior to Visit  Medication Sig Dispense Refill  . apixaban (ELIQUIS) 5 MG TABS tablet Take 1 tablet (5 mg total) by mouth 2 (two) times daily. 180 tablet 3  . Cholecalciferol (VITAMIN D3) 5000 units CAPS Take 1 capsule by mouth daily.    . diazepam (VALIUM) 5 MG tablet Take 1 tablet (5 mg total) by mouth every 6  (six) hours as needed for anxiety. 30 tablet 1  . gabapentin (NEURONTIN) 300 MG capsule Take 3 capsules (900 mg total) by mouth 3 (three) times daily. 270 capsule 3  . Multiple Vitamin (MULTIVITAMIN WITH MINERALS) TABS tablet Take 1 tablet by mouth daily.    . pregabalin (LYRICA) 75 MG capsule Take 1 capsule (75 mg total) by mouth 2 (two) times daily. 60 capsule 3   No current facility-administered medications on file prior to visit.     PAST MEDICAL HISTORY: Past Medical History:  Diagnosis Date  . BPH (benign prostatic hypertrophy)   . Bradycardia 12/30/2014   HR routinely in mid 40s-50s  . Coronary artery disease (CAD) excluded April 2016   False-positive nuclear stress test suggesting inferior ischemia  . Elevated PSA   . Encounter for Medicare annual wellness exam 09/22/2013   Sees Dr Wilhemina Bonito of Derm Sees Dr Silvano Rusk of Gastroenterology Sees Dr Kathie Rhodes of [facility] Sees Dr Susa Day of Optometry  . Finger pain, left 04/12/2016   4th   . GERD (gastroesophageal reflux disease)    controlled w/ diet and behavioral changes  . Hypertriglyceridemia 03/16/2015  . Lymphadenitis   . Melanoma of back ([place])    Excised Dr Wilhemina Bonito  . New onset a-fib ([place]) 03/10/2015  . Otitis, externa, infective 08/10/2015  . Palpitations   . Post herpetic neuralgia   . Shingles 07/09/2013  . Sleep apnea 07/30/2011   cpap- 13   . Snoring disorder 07/30/2011  . White coat hypertension     PAST SURGICAL HISTORY: Past Surgical History:  Procedure Laterality Date  . CATARACT EXTRACTION Right   . EYE SURGERY Right 12/06/2017   cataract by Dr Gillian Scarce  . LEFT HEART CATHETERIZATION WITH CORONARY ANGIOGRAM N/A 04/10/2015   Procedure: LEFT HEART CATHETERIZATION WITH  CORONARY ANGIOGRAM;  Surgeon: Leonie Man, MD;  Location: [facility] CATH LAB;  Service: Cardiovascular;  Angiographicallly NORMAL CORONARY ARTERIES  . MASS EXCISION N/A 05/16/2014   Procedure: EXCISION POSTERIOR NECK MASS, RIGHT  CHEST WALL MASS AND RIGHT AXILLARY MASS AXILLARY NODE DISSECTION;  Surgeon: Adin Hector, MD;  Location: [facility] ORS;  Service: General;  Laterality: N/A;  . NM MYOVIEW LTD  03/19/2015   FALSE POSITIVE:  INTERMEDIATE RISK. Small sized, moderate intensity inferior ischemic perfusion defect  . TRANSTHORACIC ECHOCARDIOGRAM  03/20/2015   Normal LV size and function. EF 60-65%. G1 DD. Trivial AI. Borderline aortic root dilation (41 mm), Mild LA dilation.    SOCIAL HISTORY: Social History   Tobacco Use  . Smoking status: Former Smoker    Types: Pipe, Cigarettes    Last attempt to quit: 02/11/1989    Years since quitting: 29.9  . Smokeless tobacco: Never Used  Substance Use Topics  . Alcohol use: Yes    Alcohol/week: 7.0 standard drinks    Types: 7 Glasses of wine per week    Comment: 1 per  day -- brandy or wine  . Drug use: No    FAMILY HISTORY: Family History  Problem Relation Age of Onset  . Cancer Mother 70       MM, leukemia  . Obesity Mother   . Emphysema Father 10  . Obesity Father   . Alcohol abuse Father   . Cancer Sister 45       brain  . COPD Brother 35  . Heart disease Paternal Grandmother   . COPD Paternal Grandfather   . Diabetes Sister 8  . Obesity Sister   . Down syndrome Brother 2       aspirated  . Alcohol abuse Other   . Colon cancer Neg Hx     ROS: Review of Systems  Constitutional: Negative for weight loss.  Gastrointestinal: Negative for nausea and vomiting.  Musculoskeletal:       Negative for muscle weakness  Endo/Heme/Allergies:       Negative for hypoglycemia    PHYSICAL EXAM: Blood pressure 100/66, pulse 79, temperature 97.6 F (36.4 C), temperature source Oral, height 5\' 9"  (1.753 m), weight 208 lb (94.3 kg), SpO2 96 %. Body mass index is 30.72 kg/m. Physical Exam Vitals signs reviewed.  Constitutional:      Appearance: Normal appearance. He is obese.  Cardiovascular:     Rate and Rhythm: Normal rate.  Pulmonary:     Effort:  Pulmonary effort is normal.  Musculoskeletal: Normal range of motion.  Skin:    General: Skin is warm and dry.  Neurological:     Mental Status: He is alert and oriented to person, place, and time.  Psychiatric:        Mood and Affect: Mood normal.     RECENT LABS AND TESTS: BMET    Component Value Date/Time   NA 145 (H) 12/05/2018 0841   K 4.7 12/05/2018 0841   CL 105 12/05/2018 0841   CO2 24 12/05/2018 0841   GLUCOSE 83 12/05/2018 0841   GLUCOSE 85 12/07/2016 1531   BUN 18 12/05/2018 0841   CREATININE 1.04 12/05/2018 0841   CREATININE 0.92 04/04/2015 1113   CALCIUM 9.8 12/05/2018 0841   GFRNONAA 70 12/05/2018 0841   GFRAA 81 12/05/2018 0841   Lab Results  Component Value Date   HGBA1C 5.8 (H) 12/05/2018   HGBA1C 5.6 08/29/2018   HGBA1C 5.7 (H) 04/26/2018   HGBA1C 5.8 (H) 12/22/2017   HGBA1C 5.8 (H) 08/18/2017   Lab Results  Component Value Date   INSULIN 6.7 12/05/2018   INSULIN 5.6 08/29/2018   INSULIN 6.4 04/26/2018   INSULIN 12.1 12/22/2017   INSULIN 17.6 08/18/2017   CBC    Component Value Date/Time   WBC 6.7 08/18/2017 1059   WBC 8.4 12/07/2016 1531   RBC 4.88 08/18/2017 1059   RBC 4.80 12/07/2016 1531   HGB 15.5 08/18/2017 1059   HCT 46.8 08/18/2017 1059   PLT 185.0 12/07/2016 1531   MCV 96 08/18/2017 1059   MCH 31.8 08/18/2017 1059   MCH 28.8 04/04/2015 1113   MCHC 33.1 08/18/2017 1059   MCHC 34.3 12/07/2016 1531   RDW 14.3 08/18/2017 1059   LYMPHSABS 1.7 08/18/2017 1059   MONOABS 0.7 09/13/2013 1350   EOSABS 0.1 08/18/2017 1059   BASOSABS 0.0 08/18/2017 1059   Iron/TIBC/Ferritin/ %Sat No results found for: IRON, TIBC, FERRITIN, IRONPCTSAT Lipid Panel     Component Value Date/Time   CHOL 131 04/26/2018 0902   TRIG 56 04/26/2018 0902   HDL 50 04/26/2018  0902   CHOLHDL 3 04/12/2016 1004   VLDL 21.0 04/12/2016 1004   LDLCALC 70 04/26/2018 0902   Hepatic Function Panel     Component Value Date/Time   PROT 6.5 12/05/2018 0841    ALBUMIN 4.2 12/05/2018 0841   AST 17 12/05/2018 0841   ALT 15 12/05/2018 0841   ALKPHOS 57 12/05/2018 0841   BILITOT 0.9 12/05/2018 0841   BILIDIR 0.1 02/11/2014 1016   IBILI 0.3 02/11/2014 1016      Component Value Date/Time   TSH 2.230 12/05/2018 0841   TSH 1.680 08/18/2017 1059   TSH 1.80 12/07/2016 1531     Ref. Range 12/05/2018 08:41  Vitamin D, 25-Hydroxy Latest Ref Range: 30.0 - 100.0 ng/mL 58.0     OBESITY BEHAVIORAL INTERVENTION VISIT  Today's visit was # 24  Starting weight: 237 lbs Starting date: 08/18/2017 Today's weight :: 208 lb  Today's date: 01/08/2019 Total lbs lost to date: 23  ASK: We discussed the diagnosis of obesity with [name] [name] today and [name] [name] agreed to give [name] permission to discuss obesity behavioral modification therapy today.  ASSESS: [name] [name] has the diagnosis of obesity and his BMI today is 30.7 [name] [name] is in the action stage of change   ADVISE: [name] [name] was educated on the multiple health risks of obesity as well as the benefit of weight loss to improve his health. He was advised of the need for long term treatment and the importance of lifestyle modifications to improve his current health and to decrease his risk of future health problems.  AGREE: Multiple dietary modification options and treatment options were discussed and  [name] [name] agreed to follow the recommendations documented in the above note.  ARRANGE: [name] [name] was educated on the importance of frequent visits to treat obesity as outlined per CMS and USPSTF guidelines and agreed to schedule his next follow up appointment today.  I, Tammy Wysor, am acting as Location manager for Dennard Nip, MD  I have reviewed the above documentation for accuracy and completeness, and I agree with the above. -Dennard Nip, MD

## 2019-02-09 DIAGNOSIS — H11001 Unspecified pterygium of right eye: Secondary | ICD-10-CM | POA: Diagnosis not present

## 2019-02-09 DIAGNOSIS — Z961 Presence of intraocular lens: Secondary | ICD-10-CM | POA: Diagnosis not present

## 2019-02-21 ENCOUNTER — Telehealth: Payer: Self-pay | Admitting: Neurology

## 2019-02-21 ENCOUNTER — Other Ambulatory Visit: Payer: Self-pay

## 2019-02-21 MED ORDER — PREGABALIN 75 MG PO CAPS: 150.0000 mg | ORAL_CAPSULE | Freq: Two times a day (BID) | ORAL | 5 refills | Status: DC

## 2019-02-21 NOTE — Telephone Encounter (Signed)
Patient's wife called regarding his Lyrica medication running out on Saturday. She said the dosage was increased and he will run out sooner. His pharmacy is on [address] in [facility].  Please Call. Thanks

## 2019-02-21 NOTE — Telephone Encounter (Signed)
Called and advised wife I called in Rx with increased dose

## 2019-02-28 ENCOUNTER — Telehealth: Payer: Self-pay | Admitting: Neurology

## 2019-02-28 NOTE — Telephone Encounter (Signed)
Patient's wife called regarding Cylan and him changing from Gabapentin to Lyrica. She said that [place] does not have Lyrica on the drug plan list. Please Call. Thanks

## 2019-02-28 NOTE — Telephone Encounter (Signed)
Called and spoke with wife. I advised her I rcvd a  Letter from [facility] also, and it looks like this is a quantity limit. I will start an appeal. They just refilled prescription. I advised it can take 2 weeks for a determination.

## 2019-03-01 ENCOUNTER — Encounter (INDEPENDENT_AMBULATORY_CARE_PROVIDER_SITE_OTHER): Payer: Self-pay | Admitting: Family Medicine

## 2019-03-01 ENCOUNTER — Ambulatory Visit (INDEPENDENT_AMBULATORY_CARE_PROVIDER_SITE_OTHER): Payer: Medicare Other | Admitting: Family Medicine

## 2019-03-01 VITALS — BP 113/76 | HR 70 | Ht 69.0 in | Wt 207.0 lb

## 2019-03-01 DIAGNOSIS — E669 Obesity, unspecified: Secondary | ICD-10-CM | POA: Diagnosis not present

## 2019-03-01 DIAGNOSIS — Z6830 Body mass index (BMI) 30.0-30.9, adult: Secondary | ICD-10-CM | POA: Diagnosis not present

## 2019-03-01 DIAGNOSIS — K4090 Unilateral inguinal hernia, without obstruction or gangrene, not specified as recurrent: Secondary | ICD-10-CM

## 2019-03-01 NOTE — Progress Notes (Signed)
Office: [phone]  /  Fax: [phone]   HPI:   Chief Complaint: OBESITY [name] [name] is here to discuss his progress with his obesity treatment plan. He is on the portion control better and make smarter food choices, and following the Category 3 plan and is following his eating plan approximately 90 % of the time. He states he is exercising 0 minutes 0 times per week. [name] [name] continues to do well with weight loss. He is mostly portion control and smarter choices. He is working on [employer] protein, vegetables, and decreasing sugar and breads. His hunger is controlled.  His weight is 207 lb (93.9 kg) today and has had a weight loss of 1 pound over a period of 3 weeks since his last visit. He has lost 30 lbs since starting treatment with [name].  Internal Inguinal Hernia-Left [name] [name] is having surgical repair of an internal inguinal hernia next week and has questions about if he should change his diet during this time. He has been working on improving his nutrition for the last 2 years.  ASSESSMENT AND PLAN:  Internal inguinal hernia  Class 1 obesity with serious comorbidity and body mass index (BMI) of 30.0 to 30.9 in adult, unspecified obesity type  PLAN:  Internal Inguinal Hernia-Left [name] [name] was advised to increase his water intake and watch for narcotic induced constipation after surgery, but he should be ok to resume his diet when his appetite improves.  I spent > than 50% of the 15 minute visit on counseling as documented in the note.  Obesity [name] [name] is currently in the action stage of change. As such, his goal is to continue with weight loss efforts. He has agreed to portion control better and make smarter food choices, such as increase vegetables, and decrease simple carbohydrates. [name] [name] has been instructed to work up to a goal of 150 minutes of combined cardio and strengthening exercise per week for weight loss and overall health benefits. We discussed the following Behavioral  Modification Strategies today: increasing lean protein intake, decreasing simple carbohydrates, increasing fiber rich foods, increase H2O intake, and work on meal planning and easy cooking plans.  [name] [name] has agreed to follow up with our clinic in 6 weeks. He was informed of the importance of frequent follow up visits to maximize his success with intensive lifestyle modifications for his multiple health conditions.  ALLERGIES: No Known Allergies  MEDICATIONS: Current Outpatient Medications on File Prior to Visit  Medication Sig Dispense Refill  . apixaban (ELIQUIS) 5 MG TABS tablet Take 1 tablet (5 mg total) by mouth 2 (two) times daily. 180 tablet 3  . Cholecalciferol (VITAMIN D3) 5000 units CAPS Take 1 capsule by mouth daily.    . diazepam (VALIUM) 5 MG tablet Take 1 tablet (5 mg total) by mouth every 6 (six) hours as needed for anxiety. 30 tablet 1  . gabapentin (NEURONTIN) 300 MG capsule Take 3 capsules (900 mg total) by mouth 3 (three) times daily. (Patient taking differently: Take 300 mg by mouth 3 (three) times daily. ) 270 capsule 3  . Multiple Vitamin (MULTIVITAMIN WITH MINERALS) TABS tablet Take 1 tablet by mouth daily.    . pregabalin (LYRICA) 75 MG capsule Take 2 capsules (150 mg total) by mouth 2 (two) times daily. 120 capsule 5   No current facility-administered medications on file prior to visit.     PAST MEDICAL HISTORY: Past Medical History:  Diagnosis Date  . BPH (benign prostatic hypertrophy)   . Bradycardia 12/30/2014   HR  routinely in mid 40s-50s  . Coronary artery disease (CAD) excluded April 2016   False-positive nuclear stress test suggesting inferior ischemia  . Elevated PSA   . Encounter for Medicare annual wellness exam 09/22/2013   Sees Dr Wilhemina Bonito of Derm Sees Dr Silvano Rusk of Gastroenterology Sees Dr Kathie Rhodes of [facility] Sees Dr Susa Day of Optometry  . Finger pain, left 04/12/2016   4th   . GERD (gastroesophageal reflux disease)     controlled w/ diet and behavioral changes  . Hypertriglyceridemia 03/16/2015  . Lymphadenitis   . Melanoma of back ([place])    Excised Dr Wilhemina Bonito  . New onset a-fib ([place]) 03/10/2015  . Otitis, externa, infective 08/10/2015  . Palpitations   . Post herpetic neuralgia   . Shingles 07/09/2013  . Sleep apnea 07/30/2011   cpap- 13   . Snoring disorder 07/30/2011  . White coat hypertension     PAST SURGICAL HISTORY: Past Surgical History:  Procedure Laterality Date  . CATARACT EXTRACTION Right   . EYE SURGERY Right 12/06/2017   cataract by Dr Gillian Scarce  . LEFT HEART CATHETERIZATION WITH CORONARY ANGIOGRAM N/A 04/10/2015   Procedure: LEFT HEART CATHETERIZATION WITH  CORONARY ANGIOGRAM;  Surgeon: Leonie Man, MD;  Location: [facility] CATH LAB;  Service: Cardiovascular;  Angiographicallly NORMAL CORONARY ARTERIES  . MASS EXCISION N/A 05/16/2014   Procedure: EXCISION POSTERIOR NECK MASS, RIGHT CHEST WALL MASS AND RIGHT AXILLARY MASS AXILLARY NODE DISSECTION;  Surgeon: Adin Hector, MD;  Location: [facility] ORS;  Service: General;  Laterality: N/A;  . NM MYOVIEW LTD  03/19/2015   FALSE POSITIVE:  INTERMEDIATE RISK. Small sized, moderate intensity inferior ischemic perfusion defect  . TRANSTHORACIC ECHOCARDIOGRAM  03/20/2015   Normal LV size and function. EF 60-65%. G1 DD. Trivial AI. Borderline aortic root dilation (41 mm), Mild LA dilation.    SOCIAL HISTORY: Social History   Tobacco Use  . Smoking status: Former Smoker    Types: Pipe, Cigarettes    Last attempt to quit: 02/11/1989    Years since quitting: 30.0  . Smokeless tobacco: Never Used  Substance Use Topics  . Alcohol use: Yes    Alcohol/week: 7.0 standard drinks    Types: 7 Glasses of wine per week    Comment: 1 per day -- brandy or wine  . Drug use: No    FAMILY HISTORY: Family History  Problem Relation Age of Onset  . Cancer Mother 105       MM, leukemia  . Obesity Mother   . Emphysema Father 68  . Obesity Father   . Alcohol abuse  Father   . Cancer Sister 49       brain  . COPD Brother 43  . Heart disease Paternal Grandmother   . COPD Paternal Grandfather   . Diabetes Sister 34  . Obesity Sister   . Down syndrome Brother 30       aspirated  . Alcohol abuse Other   . Colon cancer Neg Hx     ROS: Review of Systems  Constitutional: Positive for weight loss.    PHYSICAL EXAM: Blood pressure 113/76, pulse 70, height 5\' 9"  (1.753 m), weight 207 lb (93.9 kg), SpO2 97 %. Body mass index is 30.57 kg/m. Physical Exam Vitals signs reviewed.  Constitutional:      Appearance: Normal appearance. He is obese.  Cardiovascular:     Rate and Rhythm: Normal rate.  Pulmonary:     Effort: Pulmonary effort is normal.  Musculoskeletal: Normal range of motion.  Skin:    General: Skin is warm and dry.  Neurological:     Mental Status: He is alert and oriented to person, place, and time.  Psychiatric:        Mood and Affect: Mood normal.        Behavior: Behavior normal.     RECENT LABS AND TESTS: BMET    Component Value Date/Time   NA 145 (H) 12/05/2018 0841   K 4.7 12/05/2018 0841   CL 105 12/05/2018 0841   CO2 24 12/05/2018 0841   GLUCOSE 83 12/05/2018 0841   GLUCOSE 85 12/07/2016 1531   BUN 18 12/05/2018 0841   CREATININE 1.04 12/05/2018 0841   CREATININE 0.92 04/04/2015 1113   CALCIUM 9.8 12/05/2018 0841   GFRNONAA 70 12/05/2018 0841   GFRAA 81 12/05/2018 0841   Lab Results  Component Value Date   HGBA1C 5.8 (H) 12/05/2018   HGBA1C 5.6 08/29/2018   HGBA1C 5.7 (H) 04/26/2018   HGBA1C 5.8 (H) 12/22/2017   HGBA1C 5.8 (H) 08/18/2017   Lab Results  Component Value Date   INSULIN 6.7 12/05/2018   INSULIN 5.6 08/29/2018   INSULIN 6.4 04/26/2018   INSULIN 12.1 12/22/2017   INSULIN 17.6 08/18/2017   CBC    Component Value Date/Time   WBC 6.7 08/18/2017 1059   WBC 8.4 12/07/2016 1531   RBC 4.88 08/18/2017 1059   RBC 4.80 12/07/2016 1531   HGB 15.5 08/18/2017 1059   HCT 46.8 08/18/2017 1059     PLT 185.0 12/07/2016 1531   MCV 96 08/18/2017 1059   MCH 31.8 08/18/2017 1059   MCH 28.8 04/04/2015 1113   MCHC 33.1 08/18/2017 1059   MCHC 34.3 12/07/2016 1531   RDW 14.3 08/18/2017 1059   LYMPHSABS 1.7 08/18/2017 1059   MONOABS 0.7 09/13/2013 1350   EOSABS 0.1 08/18/2017 1059   BASOSABS 0.0 08/18/2017 1059   Iron/TIBC/Ferritin/ %Sat No results found for: IRON, TIBC, FERRITIN, IRONPCTSAT Lipid Panel     Component Value Date/Time   CHOL 131 04/26/2018 0902   TRIG 56 04/26/2018 0902   HDL 50 04/26/2018 0902   CHOLHDL 3 04/12/2016 1004   VLDL 21.0 04/12/2016 1004   LDLCALC 70 04/26/2018 0902   Hepatic Function Panel     Component Value Date/Time   PROT 6.5 12/05/2018 0841   ALBUMIN 4.2 12/05/2018 0841   AST 17 12/05/2018 0841   ALT 15 12/05/2018 0841   ALKPHOS 57 12/05/2018 0841   BILITOT 0.9 12/05/2018 0841   BILIDIR 0.1 02/11/2014 1016   IBILI 0.3 02/11/2014 1016      Component Value Date/Time   TSH 2.230 12/05/2018 0841   TSH 1.680 08/18/2017 1059   TSH 1.80 12/07/2016 1531   Results for Charlena Cross ALDEN "MIKE" (MRN [id number]) as of 03/01/2019 16:59  Ref. Range 12/05/2018 08:41  Vitamin D, 25-Hydroxy Latest Ref Range: 30.0 - 100.0 ng/mL 58.0    OBESITY BEHAVIORAL INTERVENTION VISIT  Today's visit was # 25   Starting weight: 237 lbs Starting date: 08/18/17 Today's weight : Weight: 207 lb (93.9 kg)  Today's date: 03/01/2019 Total lbs lost to date: 30   03/01/2019  Height 5\' 9"  (1.753 m)  Weight 207 lb (93.9 kg)  BMI (Calculated) 30.55  BLOOD PRESSURE - SYSTOLIC 798  BLOOD PRESSURE - DIASTOLIC 76   Body Fat % 31 %  Total Body Water (lbs) 107.6 lbs    ASK: We discussed the diagnosis of obesity with [name]  Sherlyn Lick today and Andrick agreed to give [name] permission to discuss obesity behavioral modification therapy today.  ASSESS: Doak has the diagnosis of obesity and his BMI today is 30.55 Rogers is in the action stage of change.   ADVISE: Severus was  educated on the multiple health risks of obesity as well as the benefit of weight loss to improve his health. He was advised of the need for long term treatment and the importance of lifestyle modifications to improve his current health and to decrease his risk of future health problems.  AGREE: Multiple dietary modification options and treatment options were discussed and Aaronmichael agreed to follow the recommendations documented in the above note.  ARRANGE: Lamari was educated on the importance of frequent visits to treat obesity as outlined per CMS and USPSTF guidelines and agreed to schedule his next follow up appointment today.  IMarcille Blanco, CMA, am acting as transcriptionist for Starlyn Skeans, MD  I have reviewed the above documentation for accuracy and completeness, and I agree with the above. -Dennard Nip, MD

## 2019-03-22 ENCOUNTER — Encounter (INDEPENDENT_AMBULATORY_CARE_PROVIDER_SITE_OTHER): Payer: Self-pay

## 2019-03-23 ENCOUNTER — Other Ambulatory Visit: Payer: Self-pay | Admitting: Physician Assistant

## 2019-03-26 ENCOUNTER — Telehealth: Payer: Self-pay | Admitting: Neurology

## 2019-03-26 MED ORDER — PREGABALIN 150 MG PO CAPS: 150.0000 mg | ORAL_CAPSULE | Freq: Two times a day (BID) | ORAL | 5 refills | Status: DC

## 2019-03-26 NOTE — Telephone Encounter (Signed)
Called and spoke with [name] [name] at [facility] [phone], quantity limit on Lyrica. Will change to 150 mg capsules.  Called and advised wife [name] [name] will be getting different dosage  Called [facility], spoke with Solmon Ice, gave verbal for #60 5r, then spoke with Caryl Pina who confirmed the Rx went thru for a copy of $45.48. I called and advised wife, [name] [name]
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
## 2019-03-26 NOTE — Telephone Encounter (Signed)
Pt states that he needs to speak to someone about his medication lyrica. He states that someone was suppose to be checking on this with his insurance and getting back to him. He has not heard anything and will be out of medication tonight

## 2019-03-27 ENCOUNTER — Other Ambulatory Visit: Payer: Self-pay

## 2019-03-27 ENCOUNTER — Telehealth (INDEPENDENT_AMBULATORY_CARE_PROVIDER_SITE_OTHER): Payer: Medicare Other | Admitting: Neurology

## 2019-03-27 ENCOUNTER — Encounter: Payer: Self-pay | Admitting: Neurology

## 2019-03-27 VITALS — BP 119/72 | Temp 98.0°F | Ht 69.0 in | Wt 207.0 lb

## 2019-03-27 DIAGNOSIS — B0229 Other postherpetic nervous system involvement: Secondary | ICD-10-CM

## 2019-04-02 ENCOUNTER — Ambulatory Visit: Payer: Medicare Other | Admitting: Neurology

## 2019-04-12 ENCOUNTER — Other Ambulatory Visit: Payer: Self-pay

## 2019-04-12 ENCOUNTER — Ambulatory Visit (INDEPENDENT_AMBULATORY_CARE_PROVIDER_SITE_OTHER): Payer: Medicare Other | Admitting: Family Medicine

## 2019-04-12 ENCOUNTER — Encounter (INDEPENDENT_AMBULATORY_CARE_PROVIDER_SITE_OTHER): Payer: Self-pay | Admitting: Family Medicine

## 2019-04-12 DIAGNOSIS — E669 Obesity, unspecified: Secondary | ICD-10-CM | POA: Diagnosis not present

## 2019-04-12 DIAGNOSIS — R7303 Prediabetes: Secondary | ICD-10-CM | POA: Diagnosis not present

## 2019-04-12 DIAGNOSIS — Z6830 Body mass index (BMI) 30.0-30.9, adult: Secondary | ICD-10-CM | POA: Diagnosis not present
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
## 2019-04-18 ENCOUNTER — Other Ambulatory Visit: Payer: Self-pay | Admitting: Neurology

## 2019-04-18 MED ORDER — PREGABALIN 150 MG PO CAPS: ORAL_CAPSULE | ORAL | 3 refills | Status: DC

## 2019-04-19 DIAGNOSIS — G4733 Obstructive sleep apnea (adult) (pediatric): Secondary | ICD-10-CM | POA: Diagnosis not present

## 2019-05-14 DIAGNOSIS — L821 Other seborrheic keratosis: Secondary | ICD-10-CM | POA: Diagnosis not present

## 2019-05-14 DIAGNOSIS — L400 Psoriasis vulgaris: Secondary | ICD-10-CM | POA: Diagnosis not present

## 2019-05-14 DIAGNOSIS — D1801 Hemangioma of skin and subcutaneous tissue: Secondary | ICD-10-CM | POA: Diagnosis not present

## 2019-05-14 DIAGNOSIS — Z85820 Personal history of malignant melanoma of skin: Secondary | ICD-10-CM | POA: Diagnosis not present

## 2019-05-14 DIAGNOSIS — D225 Melanocytic nevi of trunk: Secondary | ICD-10-CM | POA: Diagnosis not present

## 2019-05-17 ENCOUNTER — Ambulatory Visit (INDEPENDENT_AMBULATORY_CARE_PROVIDER_SITE_OTHER): Payer: Medicare Other | Admitting: Family Medicine

## 2019-05-17 ENCOUNTER — Encounter (INDEPENDENT_AMBULATORY_CARE_PROVIDER_SITE_OTHER): Payer: Self-pay | Admitting: Family Medicine

## 2019-05-17 ENCOUNTER — Other Ambulatory Visit: Payer: Self-pay

## 2019-05-17 DIAGNOSIS — Z6830 Body mass index (BMI) 30.0-30.9, adult: Secondary | ICD-10-CM | POA: Diagnosis not present

## 2019-05-17 DIAGNOSIS — E669 Obesity, unspecified: Secondary | ICD-10-CM | POA: Diagnosis not present

## 2019-05-17 DIAGNOSIS — R7303 Prediabetes: Secondary | ICD-10-CM

## 2019-05-22 ENCOUNTER — Encounter: Payer: Self-pay | Admitting: Family Medicine

## 2019-05-22 NOTE — Progress Notes (Signed)
Office: [phone]  /  Fax: [phone] TeleHealth Visit:  [name] [name] has verbally consented to this TeleHealth visit today. The patient is located in the car, the provider is located at the [employer] [facility] office. The participants in this visit include the listed provider, patient, and his spouse [name] [name]. [name] [name] was unable to use realtime audiovisual technology today and the telehealth visit was conducted via telephone.  HPI:   Chief Complaint: OBESITY [name] [name] is here to discuss his progress with his obesity treatment plan. He is on the Category 3 plan and is following his eating plan approximately 75 to 80 % of the time. He states he is doing yard work. [name] [name] has done well maintaining his weight. He has been training more, but is still very active. 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 He hasn't followed his plan as closely, but is still very mindful. His hunger is controlled.  We were unable to weigh the patient today for this TeleHealth visit. He feels as if he has maintained weight since his last visit. He has lost 30 lbs since starting treatment with [name].  Pre-Diabetes [name] [name] has a diagnosis of pre-diabetes based on his elevated Hgb A1c and was informed this puts him at greater risk of developing diabetes. [name] [name] continues to do well with diet, exercise, and weight loss to decrease risk of diabetes. He feels that his hunger is controlled overall. His last A1c was 5.8 on 12/05/18. He denies hypoglycemia.   ASSESSMENT AND PLAN:  Prediabetes  Class 1 obesity with serious comorbidity and body mass index (BMI) of 30.0 to 30.9 in adult, unspecified obesity type  PLAN:  Pre-Diabetes [name] [name] will continue to work on weight loss, exercise, and decreasing simple carbohydrates in his diet to help decrease the risk of diabetes. He was informed that eating too many simple carbohydrates or too many calories at one sitting increases the likelihood of GI side  effects. [name] [name] agreed to his diet and exercise. We will recheck labs when it is safe to come into the office. [name] [name] agreed to follow up with [name] as directed to monitor his progress in 2 months.   I spent > than 50% of the 25 minute visit on counseling as documented in the note.  Obesity [name] [name] is currently in the action stage of change. As such, his goal is to continue with weight loss efforts. He has agreed to follow the Category 3 plan. [name] [name] has been instructed to work up to a goal of 150 minutes of combined cardio and strengthening exercise per week for weight loss and overall health benefits. We discussed the following Behavioral Modification Strategies today: increasing lean protein intake, decreasing simple carbohydrates, and work on meal planning and easy cooking plans.  [name] [name] has agreed to follow up with our clinic in 2 months. He was informed of the importance of frequent follow up visits to maximize his success with intensive lifestyle modifications for his multiple health conditions.  ALLERGIES: No Known Allergies  MEDICATIONS: Current Outpatient Medications on File Prior to Visit  Medication Sig Dispense Refill  . Cholecalciferol (VITAMIN D3) 5000 units CAPS Take 1 capsule by mouth daily.    . diazepam (VALIUM) 5 MG tablet Take 1 tablet (5 mg total) by mouth every 6 (six) hours as needed for anxiety. 30 tablet 1  . ELIQUIS 5 MG TABS tablet Take 1 tablet by mouth twice daily 180 tablet 0  . gabapentin (NEURONTIN) 300 MG capsule Take 3 capsules (900  mg total) by mouth 3 (three) times daily. (Patient taking differently: Take 300 mg by mouth 3 (three) times daily. ) 270 capsule 3  . Multiple Vitamin (MULTIVITAMIN WITH MINERALS) TABS tablet Take 1 tablet by mouth daily.    . pregabalin (LYRICA) 150 MG capsule Take 1 capsule in AM and 2 capsules in afternoon 90 capsule 3   No current facility-administered medications on file prior to visit.     PAST MEDICAL HISTORY: Past Medical  History:  Diagnosis Date  . BPH (benign prostatic hypertrophy)   . Bradycardia 12/30/2014   HR routinely in mid 40s-50s  . Coronary artery disease (CAD) excluded April 2016   False-positive nuclear stress test suggesting inferior ischemia  . Elevated PSA   . Encounter for Medicare annual wellness exam 09/22/2013   Sees Dr Wilhemina Bonito of Derm Sees Dr Silvano Rusk of Gastroenterology Sees Dr Kathie Rhodes of [facility] Sees Dr Susa Day of Optometry  . Finger pain, left 04/12/2016   4th   . GERD (gastroesophageal reflux disease)    controlled w/ diet and behavioral changes  . Hypertriglyceridemia 03/16/2015  . Lymphadenitis   . Melanoma of back ([place])    Excised Dr Wilhemina Bonito  . New onset a-fib ([place]) 03/10/2015  . Otitis, externa, infective 08/10/2015  . Palpitations   . Post herpetic neuralgia   . Shingles 07/09/2013  . Sleep apnea 07/30/2011   cpap- 13   . Snoring disorder 07/30/2011  . White coat hypertension     PAST SURGICAL HISTORY: Past Surgical History:  Procedure Laterality Date  . CATARACT EXTRACTION Right   . EYE SURGERY Right 12/06/2017   cataract by Dr Gillian Scarce  . LEFT HEART CATHETERIZATION WITH CORONARY ANGIOGRAM N/A 04/10/2015   Procedure: LEFT HEART CATHETERIZATION WITH  CORONARY ANGIOGRAM;  Surgeon: Leonie Man, MD;  Location: [facility];  Service: Cardiovascular;  Angiographicallly NORMAL CORONARY ARTERIES  . MASS EXCISION N/A 05/16/2014   Procedure: EXCISION POSTERIOR NECK MASS, RIGHT CHEST WALL MASS AND RIGHT AXILLARY MASS AXILLARY NODE DISSECTION;  Surgeon: Adin Hector, MD;  Location: [facility];  Service: General;  Laterality: N/A;  . NM MYOVIEW LTD  03/19/2015   FALSE POSITIVE:  INTERMEDIATE RISK. Small sized, moderate intensity inferior ischemic perfusion defect  . TRANSTHORACIC ECHOCARDIOGRAM  03/20/2015   Normal LV size and function. EF 60-65%. G1 DD. Trivial AI. Borderline aortic root dilation (41 mm), Mild LA dilation.    SOCIAL HISTORY: Social  History   Tobacco Use  . Smoking status: Former Smoker    Types: Pipe, Cigarettes    Last attempt to quit: 02/11/1989    Years since quitting: 30.2  . Smokeless tobacco: Never Used  Substance Use Topics  . Alcohol use: Yes    Alcohol/week: 7.0 standard drinks    Types: 7 Glasses of wine per week    Comment: 1 per day -- brandy or wine  . Drug use: No    FAMILY HISTORY: Family History  Problem Relation Age of Onset  . Cancer Mother 33       MM, leukemia  . Obesity Mother   . Emphysema Father 70  . Obesity Father   . Alcohol abuse Father   . Cancer Sister 14       brain  . COPD Brother 47  . Heart disease Paternal Grandmother   . COPD Paternal Grandfather   . Diabetes Sister 42  . Obesity Sister   . Down syndrome Brother 69  aspirated  . Alcohol abuse Other   . Colon cancer Neg Hx     ROS: Review of Systems  Endo/Heme/Allergies:       Negative for polyphagia.    PHYSICAL EXAM: Pt in no acute distress  RECENT LABS AND TESTS: BMET    Component Value Date/Time   NA 145 (H) 12/05/2018 0841   K 4.7 12/05/2018 0841   CL 105 12/05/2018 0841   CO2 24 12/05/2018 0841   GLUCOSE 83 12/05/2018 0841   GLUCOSE 85 12/07/2016 1531   BUN 18 12/05/2018 0841   CREATININE 1.04 12/05/2018 0841   CREATININE 0.92 04/04/2015 1113   CALCIUM 9.8 12/05/2018 0841   GFRNONAA 70 12/05/2018 0841   GFRAA 81 12/05/2018 0841   Lab Results  Component Value Date   HGBA1C 5.8 (H) 12/05/2018   HGBA1C 5.6 08/29/2018   HGBA1C 5.7 (H) 04/26/2018   HGBA1C 5.8 (H) 12/22/2017   HGBA1C 5.8 (H) 08/18/2017   Lab Results  Component Value Date   INSULIN 6.7 12/05/2018   INSULIN 5.6 08/29/2018   INSULIN 6.4 04/26/2018   INSULIN 12.1 12/22/2017   INSULIN 17.6 08/18/2017   CBC    Component Value Date/Time   WBC 6.7 08/18/2017 1059   WBC 8.4 12/07/2016 1531   RBC 4.88 08/18/2017 1059   RBC 4.80 12/07/2016 1531   HGB 15.5 08/18/2017 1059   HCT 46.8 08/18/2017 1059   PLT 185.0  12/07/2016 1531   MCV 96 08/18/2017 1059   MCH 31.8 08/18/2017 1059   MCH 28.8 04/04/2015 1113   MCHC 33.1 08/18/2017 1059   MCHC 34.3 12/07/2016 1531   RDW 14.3 08/18/2017 1059   LYMPHSABS 1.7 08/18/2017 1059   MONOABS 0.7 09/13/2013 1350   EOSABS 0.1 08/18/2017 1059   BASOSABS 0.0 08/18/2017 1059   Iron/TIBC/Ferritin/ %Sat No results found for: IRON, TIBC, FERRITIN, IRONPCTSAT Lipid Panel     Component Value Date/Time   CHOL 131 04/26/2018 0902   TRIG 56 04/26/2018 0902   HDL 50 04/26/2018 0902   CHOLHDL 3 04/12/2016 1004   VLDL 21.0 04/12/2016 1004   LDLCALC 70 04/26/2018 0902   Hepatic Function Panel     Component Value Date/Time   PROT 6.5 12/05/2018 0841   ALBUMIN 4.2 12/05/2018 0841   AST 17 12/05/2018 0841   ALT 15 12/05/2018 0841   ALKPHOS 57 12/05/2018 0841   BILITOT 0.9 12/05/2018 0841   BILIDIR 0.1 02/11/2014 1016   IBILI 0.3 02/11/2014 1016      Component Value Date/Time   TSH 2.230 12/05/2018 0841   TSH 1.680 08/18/2017 1059   TSH 1.80 12/07/2016 1531   Results for [name] Cross ALDEN "MIKE" (MRN [id number]) as of 05/22/2019 12:44  Ref. Range 12/05/2018 08:41  Vitamin D, 25-Hydroxy Latest Ref Range: 30.0 - 100.0 ng/mL 58.0     I, Marcille Blanco, CMA, am acting as transcriptionist for Starlyn Skeans, MD I have reviewed the above documentation for accuracy and completeness, and I agree with the above. -Dennard Nip, MD

## 2019-05-23 ENCOUNTER — Other Ambulatory Visit: Payer: Self-pay | Admitting: Family Medicine

## 2019-05-23 MED ORDER — GABAPENTIN 300 MG PO CAPS: 900.0000 mg | ORAL_CAPSULE | Freq: Three times a day (TID) | ORAL | 5 refills | Status: DC

## 2019-05-24 ENCOUNTER — Ambulatory Visit: Payer: Self-pay | Admitting: Surgery

## 2019-05-26 ENCOUNTER — Encounter (HOSPITAL_BASED_OUTPATIENT_CLINIC_OR_DEPARTMENT_OTHER): Payer: Self-pay

## 2019-05-28 ENCOUNTER — Telehealth: Payer: Self-pay | Admitting: *Deleted

## 2019-05-28 ENCOUNTER — Other Ambulatory Visit: Payer: Self-pay

## 2019-05-28 ENCOUNTER — Encounter (HOSPITAL_BASED_OUTPATIENT_CLINIC_OR_DEPARTMENT_OTHER): Payer: Self-pay

## 2019-05-28 NOTE — Telephone Encounter (Signed)
   [facility] Pre-operative Risk Assessment    Request for surgical clearance:  1. What type of surgery is being performed? Hernia repair  2. When is this surgery scheduled? 06/01/2019  3. What type of clearance is required (medical clearance vs. Pharmacy clearance to hold med vs. Both)? pharmacy  4. Are there any medications that need to be held prior to surgery and how long? eliquis -need direction  5. Practice name and name of physician performing surgery? Dr Johney Maine  6. What is your office phone number [phone]   7.   What is your office fax number [phone]  8.   Anesthesia type (None, local, MAC, general) ? general   [name] [name] 05/28/2019, 4:16 PM  _________________________________________________________________   (provider comments below)

## 2019-05-28 NOTE — Progress Notes (Signed)
Spoke with:  [place] NPO:  No food after midnight/Clear liquids until_____ DOS Arrival time: 0530 Labs: (COVID in epic) AM medications: Valium, Gabapentin Pre op orders: Yes Ride home: [name] wife [phone] Instructed to bring CPAP machine Instructed to wash Hibiclens

## 2019-05-28 NOTE — Telephone Encounter (Signed)
   Primary Cardiologist: Glenetta Hew, MD  Chart reviewed as part of pre-operative protocol coverage. Patient was contacted 05/28/2019 in reference to pre-operative risk assessment for pending surgery as outlined below.  Kwasi Joung was last seen 09/2018 by Dr. Ellyn Hack. Has h/o persistent AF, chronic bradycardia, minimal CAD 2016, BPH, GERD, HLD, OSA on CPAP.  The patient affirms he's been doing well without any CP, SOB, palpitations, syncope. 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 Therefore, based on ACC/AHA guidelines, the patient would be at acceptable risk for the planned procedure without further cardiovascular testing.   His anticoagulation was addressed in note from 12/2018. Per pharmacist, "Per office protocol, patient can holdEliquisfor 1-2days prior to procedure."  I will route this recommendation to the requesting party via [facility] fax function and remove from pre-op pool. Will defer to surgeon to decide how many days to hold Eliquis based on above recommendation - I told patient we were sending updated clearance right away and he can reach out to them for final stop date.  Please call with questions.  Charlie Pitter, PA-C 05/28/2019, 4:36 PM

## 2019-05-28 NOTE — Progress Notes (Signed)
SPOKE W/  Leith     SCREENING SYMPTOMS OF COVID 19:   COUGH--NO  RUNNY NOSE--- NO  SORE THROAT---NO  NASAL CONGESTION----NO  SNEEZING----NO  SHORTNESS OF BREATH---NO  DIFFICULTY BREATHING---NO  TEMP >100.0 -----NO  UNEXPLAINED BODY ACHES------NO  CHILLS -------- NO  HEADACHES ---------NO  LOSS OF SMELL/ TASTE --------NO    HAVE YOU OR ANY FAMILY MEMBER TRAVELLED PAST 14 DAYS OUT OF THE   COUNTY---NO STATE----NO COUNTRY----NO  HAVE YOU OR ANY FAMILY MEMBER BEEN EXPOSED TO ANYONE WITH COVID 19? NO

## 2019-05-29 ENCOUNTER — Other Ambulatory Visit (HOSPITAL_COMMUNITY)
Admission: RE | Admit: 2019-05-29 | Discharge: 2019-05-29 | Disposition: A | Discharge status: Home / Self Care | Payer: Medicare Other | Source: Ambulatory Visit | Attending: Surgery | Admitting: Surgery

## 2019-05-29 DIAGNOSIS — Z1159 Encounter for screening for other viral diseases: Secondary | ICD-10-CM | POA: Insufficient documentation

## 2019-05-30 LAB — NOVEL CORONAVIRUS, NAA (HOSP ORDER, SEND-OUT TO REF LAB; TAT 18-24 HRS): SARS-CoV-2, NAA: NOT DETECTED

## 2019-05-31 NOTE — Anesthesia Preprocedure Evaluation (Addendum)
Anesthesia Evaluation  Patient identified by MRN, date of birth, ID band Patient awake    Reviewed: Allergy & Precautions, NPO status , Patient's Chart, lab work & pertinent test results  Airway Mallampati: II  TM Distance: >3 FB Neck ROM: Full    Dental no notable dental hx. (+) Teeth Intact   Pulmonary sleep apnea , former smoker,    Pulmonary exam normal breath sounds clear to auscultation       Cardiovascular Normal cardiovascular exam Rhythm:Regular Rate:Normal     Neuro/Psych Anxiety    GI/Hepatic Neg liver ROS, GERD  ,  Endo/Other    Renal/GU negative Renal ROS     Musculoskeletal  (+) Arthritis ,   Abdominal   Peds  Hematology   Anesthesia Other Findings   Reproductive/Obstetrics                            Lab Results  Component Value Date   WBC 6.7 08/18/2017   HGB 15.5 08/18/2017   HCT 46.8 08/18/2017   MCV 96 08/18/2017   PLT 185.0 12/07/2016   Lab Results  Component Value Date   CREATININE 1.04 12/05/2018   BUN 18 12/05/2018   NA 145 (H) 12/05/2018   K 4.7 12/05/2018   CL 105 12/05/2018   CO2 24 12/05/2018    Anesthesia Physical Anesthesia Plan  ASA: II  Anesthesia Plan: General   Post-op Pain Management:    Induction: Intravenous, Rapid sequence and Cricoid pressure planned  PONV Risk Score and Plan: 3 and Treatment may vary due to age or medical condition, Ondansetron and Dexamethasone  Airway Management Planned: Oral ETT  Additional Equipment:   Intra-op Plan:   Post-operative Plan: Extubation in OR  Informed Consent: I have reviewed the patients History and Physical, chart, labs and discussed the procedure including the risks, benefits and alternatives for the proposed anesthesia with the patient or authorized representative who has indicated his/her understanding and acceptance.     Dental advisory given  Plan Discussed with:   Anesthesia  Plan Comments:        Anesthesia Quick Evaluation

## 2019-05-31 NOTE — Progress Notes (Signed)
SPOKE W/  _wife     SCREENING SYMPTOMS OF COVID 19:   COUGH--n  RUNNY NOSE--- n  SORE THROAT---n  NASAL CONGESTION----n  SNEEZING----n  SHORTNESS OF BREATH---n  DIFFICULTY BREATHING---n  TEMP >100.0 -----n  UNEXPLAINED BODY ACHES------n CHILLS -------- n  HEADACHES ---------n  LOSS OF SMELL/ TASTE --------n  Reminded to self quarantine.  HAVE YOU OR ANY FAMILY MEMBER TRAVELLED PAST 14 DAYS OUT OF THE   COUNTY---n STATE----n COUNTRY----nn HAVE YOU OR ANY FAMILY MEMBER BEEN EXPOSED TO ANYONE WITH COVID 19? n

## 2019-06-01 ENCOUNTER — Encounter (HOSPITAL_BASED_OUTPATIENT_CLINIC_OR_DEPARTMENT_OTHER)
Admission: RE | Disposition: A | Discharge status: Home / Self Care | Payer: Self-pay | Source: Home / Self Care | Attending: Surgery

## 2019-06-01 ENCOUNTER — Other Ambulatory Visit: Payer: Self-pay

## 2019-06-01 ENCOUNTER — Ambulatory Visit (HOSPITAL_BASED_OUTPATIENT_CLINIC_OR_DEPARTMENT_OTHER)
Admission: RE | Admit: 2019-06-01 | Discharge: 2019-06-01 | Disposition: A | Discharge status: Home / Self Care | Payer: Medicare Other | Attending: Surgery | Admitting: Surgery

## 2019-06-01 ENCOUNTER — Ambulatory Visit (HOSPITAL_BASED_OUTPATIENT_CLINIC_OR_DEPARTMENT_OTHER): Payer: Medicare Other | Admitting: Anesthesiology

## 2019-06-01 ENCOUNTER — Encounter (HOSPITAL_BASED_OUTPATIENT_CLINIC_OR_DEPARTMENT_OTHER): Payer: Self-pay | Admitting: *Deleted

## 2019-06-01 DIAGNOSIS — K4190 Unilateral femoral hernia, without obstruction or gangrene, not specified as recurrent: Secondary | ICD-10-CM | POA: Diagnosis not present

## 2019-06-01 DIAGNOSIS — Z7901 Long term (current) use of anticoagulants: Secondary | ICD-10-CM | POA: Diagnosis not present

## 2019-06-01 DIAGNOSIS — Z87891 Personal history of nicotine dependence: Secondary | ICD-10-CM | POA: Diagnosis not present

## 2019-06-01 DIAGNOSIS — I4819 Other persistent atrial fibrillation: Secondary | ICD-10-CM | POA: Diagnosis not present

## 2019-06-01 DIAGNOSIS — K4020 Bilateral inguinal hernia, without obstruction or gangrene, not specified as recurrent: Secondary | ICD-10-CM | POA: Diagnosis not present

## 2019-06-01 DIAGNOSIS — F419 Anxiety disorder, unspecified: Secondary | ICD-10-CM | POA: Diagnosis not present

## 2019-06-01 DIAGNOSIS — N400 Enlarged prostate without lower urinary tract symptoms: Secondary | ICD-10-CM | POA: Insufficient documentation

## 2019-06-01 DIAGNOSIS — M1990 Unspecified osteoarthritis, unspecified site: Secondary | ICD-10-CM | POA: Diagnosis not present

## 2019-06-01 DIAGNOSIS — I482 Chronic atrial fibrillation, unspecified: Secondary | ICD-10-CM | POA: Diagnosis not present

## 2019-06-01 DIAGNOSIS — G4733 Obstructive sleep apnea (adult) (pediatric): Secondary | ICD-10-CM

## 2019-06-01 DIAGNOSIS — K4090 Unilateral inguinal hernia, without obstruction or gangrene, not specified as recurrent: Secondary | ICD-10-CM | POA: Diagnosis not present

## 2019-06-01 HISTORY — DX: Thoracic aortic ectasia: I77.810

## 2019-06-01 HISTORY — PX: INGUINAL HERNIA REPAIR: SHX194

## 2019-06-01 HISTORY — DX: Unspecified atrial fibrillation: I48.91

## 2019-06-01 HISTORY — DX: Unspecified osteoarthritis, unspecified site: M19.90

## 2019-06-01 HISTORY — DX: Personal history of urinary calculi: Z87.442

## 2019-06-01 SURGERY — REPAIR, HERNIA, INGUINAL, LAPAROSCOPIC
Anesthesia: General | Laterality: Bilateral

## 2019-06-01 MED ORDER — LIDOCAINE 2% (20 MG/ML) 5 ML SYRINGE
INTRAMUSCULAR | Status: DC | PRN
  Administered 2019-06-01: 100 mg via INTRAVENOUS

## 2019-06-01 MED ORDER — CHLORHEXIDINE GLUCONATE CLOTH 2 % EX PADS
6.0000 | MEDICATED_PAD | Freq: Once | CUTANEOUS | Status: DC
  Filled 2019-06-01: qty 6

## 2019-06-01 MED ORDER — ROCURONIUM BROMIDE 10 MG/ML (PF) SYRINGE
PREFILLED_SYRINGE | INTRAVENOUS | Status: DC | PRN
  Administered 2019-06-01 (×2): 10 mg via INTRAVENOUS
  Administered 2019-06-01: 50 mg via INTRAVENOUS

## 2019-06-01 MED ORDER — GABAPENTIN 300 MG PO CAPS
300.0000 mg | ORAL_CAPSULE | Freq: Once | ORAL | Status: DC
  Filled 2019-06-01: qty 1

## 2019-06-01 MED ORDER — PROPOFOL 10 MG/ML IV BOLUS
INTRAVENOUS | Status: DC | PRN
  Administered 2019-06-01: 50 mg via INTRAVENOUS
  Administered 2019-06-01: 150 mg via INTRAVENOUS
  Administered 2019-06-01: 30 mg via INTRAVENOUS

## 2019-06-01 MED ORDER — TRAMADOL HCL 50 MG PO TABS: 50.0000 mg | ORAL_TABLET | Freq: Four times a day (QID) | ORAL | 0 refills | Status: DC | PRN

## 2019-06-01 MED ORDER — FENTANYL CITRATE (PF) 100 MCG/2ML IJ SOLN
INTRAMUSCULAR | Status: AC
  Filled 2019-06-01: qty 2

## 2019-06-01 MED ORDER — DEXAMETHASONE SODIUM PHOSPHATE 4 MG/ML IJ SOLN
INTRAMUSCULAR | Status: DC | PRN
  Administered 2019-06-01: 10 mg via INTRAVENOUS

## 2019-06-01 MED ORDER — ONDANSETRON HCL 4 MG/2ML IJ SOLN
INTRAMUSCULAR | Status: AC
  Filled 2019-06-01: qty 2

## 2019-06-01 MED ORDER — LIDOCAINE 2% (20 MG/ML) 5 ML SYRINGE
INTRAMUSCULAR | Status: AC
  Filled 2019-06-01: qty 5

## 2019-06-01 MED ORDER — BUPIVACAINE-EPINEPHRINE 0.25% -1:200000 IJ SOLN
INTRAMUSCULAR | Status: DC | PRN
  Administered 2019-06-01: 60 mL

## 2019-06-01 MED ORDER — GABAPENTIN 300 MG PO CAPS
ORAL_CAPSULE | ORAL | Status: AC
  Filled 2019-06-01: qty 1

## 2019-06-01 MED ORDER — BUPIVACAINE LIPOSOME 1.3 % IJ SUSP
INTRAMUSCULAR | Status: DC | PRN
  Administered 2019-06-01: 20 mL

## 2019-06-01 MED ORDER — FENTANYL CITRATE (PF) 100 MCG/2ML IJ SOLN
INTRAMUSCULAR | Status: DC | PRN
  Administered 2019-06-01: 100 ug via INTRAVENOUS
  Administered 2019-06-01 (×2): 50 ug via INTRAVENOUS

## 2019-06-01 MED ORDER — HYDROCODONE-ACETAMINOPHEN 7.5-325 MG PO TABS
1.0000 | ORAL_TABLET | Freq: Once | ORAL | Status: DC | PRN
  Filled 2019-06-01: qty 1

## 2019-06-01 MED ORDER — GABAPENTIN 300 MG PO CAPS
300.0000 mg | ORAL_CAPSULE | ORAL | Status: AC
  Administered 2019-06-01: 300 mg via ORAL
  Filled 2019-06-01: qty 1

## 2019-06-01 MED ORDER — SUGAMMADEX SODIUM 200 MG/2ML IV SOLN
INTRAVENOUS | Status: DC | PRN
  Administered 2019-06-01: 200 mg via INTRAVENOUS

## 2019-06-01 MED ORDER — HYDROMORPHONE HCL 1 MG/ML IJ SOLN
0.2500 mg | INTRAMUSCULAR | Status: DC | PRN
  Filled 2019-06-01: qty 0.5

## 2019-06-01 MED ORDER — ROCURONIUM BROMIDE 10 MG/ML (PF) SYRINGE
PREFILLED_SYRINGE | INTRAVENOUS | Status: AC
  Filled 2019-06-01: qty 10

## 2019-06-01 MED ORDER — ACETAMINOPHEN 500 MG PO TABS
1000.0000 mg | ORAL_TABLET | ORAL | Status: AC
  Administered 2019-06-01: 07:00:00 1000 mg via ORAL
  Filled 2019-06-01: qty 2

## 2019-06-01 MED ORDER — DEXAMETHASONE SODIUM PHOSPHATE 10 MG/ML IJ SOLN
INTRAMUSCULAR | Status: AC
  Filled 2019-06-01: qty 1

## 2019-06-01 MED ORDER — LACTATED RINGERS IV SOLN
INTRAVENOUS | Status: DC
  Administered 2019-06-01: 07:00:00 via INTRAVENOUS
  Filled 2019-06-01: qty 1000

## 2019-06-01 MED ORDER — SUCCINYLCHOLINE CHLORIDE 200 MG/10ML IV SOSY
PREFILLED_SYRINGE | INTRAVENOUS | Status: AC
  Filled 2019-06-01: qty 10

## 2019-06-01 MED ORDER — BUPIVACAINE LIPOSOME 1.3 % IJ SUSP
20.0000 mL | Freq: Once | INTRAMUSCULAR | Status: DC
  Filled 2019-06-01: qty 20

## 2019-06-01 MED ORDER — ONDANSETRON HCL 4 MG/2ML IJ SOLN
4.0000 mg | Freq: Once | INTRAMUSCULAR | Status: DC | PRN
  Filled 2019-06-01: qty 2

## 2019-06-01 MED ORDER — ACETAMINOPHEN 500 MG PO TABS
1000.0000 mg | ORAL_TABLET | Freq: Once | ORAL | Status: AC
  Administered 2019-06-01: 1000 mg via ORAL
  Filled 2019-06-01: qty 2

## 2019-06-01 MED ORDER — SUCCINYLCHOLINE CHLORIDE 20 MG/ML IJ SOLN
INTRAMUSCULAR | Status: DC | PRN
  Administered 2019-06-01: 100 mg via INTRAVENOUS

## 2019-06-01 MED ORDER — ACETAMINOPHEN 500 MG PO TABS
ORAL_TABLET | ORAL | Status: AC
  Filled 2019-06-01: qty 2

## 2019-06-01 MED ORDER — CEFAZOLIN SODIUM-DEXTROSE 2-4 GM/100ML-% IV SOLN
2.0000 g | INTRAVENOUS | Status: AC
  Administered 2019-06-01: 2 g via INTRAVENOUS
  Filled 2019-06-01: qty 100

## 2019-06-01 MED ORDER — ONDANSETRON HCL 4 MG/2ML IJ SOLN
INTRAMUSCULAR | Status: DC | PRN
  Administered 2019-06-01: 4 mg via INTRAVENOUS

## 2019-06-01 MED ORDER — EPHEDRINE SULFATE-NACL 50-0.9 MG/10ML-% IV SOSY
PREFILLED_SYRINGE | INTRAVENOUS | Status: DC | PRN
  Administered 2019-06-01 (×2): 10 mg via INTRAVENOUS

## 2019-06-01 MED ORDER — CEFAZOLIN SODIUM-DEXTROSE 2-4 GM/100ML-% IV SOLN
INTRAVENOUS | Status: AC
  Filled 2019-06-01: qty 100

## 2019-06-01 SURGICAL SUPPLY — 40 items
CABLE HIGH FREQUENCY MONO STRZ (ELECTRODE) ×3 IMPLANT
CANISTER SUCT 3000ML PPV (MISCELLANEOUS) IMPLANT
CHLORAPREP W/TINT 26ML (MISCELLANEOUS) ×3 IMPLANT
COVER WAND RF STERILE (DRAPES) ×3 IMPLANT
DECANTER SPIKE VIAL GLASS SM (MISCELLANEOUS) ×3 IMPLANT
DEVICE SECURE STRAP 25 ABSORB (INSTRUMENTS) IMPLANT
DRAPE WARM FLUID 44X44 (DRAPES) ×3 IMPLANT
DRSG TEGADERM 2-3/8X2-3/4 SM (GAUZE/BANDAGES/DRESSINGS) ×12 IMPLANT
DRSG TEGADERM 4X4.75 (GAUZE/BANDAGES/DRESSINGS) ×3 IMPLANT
ELECT REM PT RETURN 9FT ADLT (ELECTROSURGICAL) ×3
ELECTRODE REM PT RTRN 9FT ADLT (ELECTROSURGICAL) ×1 IMPLANT
GAUZE SPONGE 2X2 12PLY NS (GAUZE/BANDAGES/DRESSINGS) ×6 IMPLANT
GLOVE ECLIPSE 8.0 STRL XLNG CF (GLOVE) ×3 IMPLANT
GLOVE INDICATOR 8.0 STRL GRN (GLOVE) ×3 IMPLANT
GOWN STRL REUS W/TWL XL LVL3 (GOWN DISPOSABLE) ×3 IMPLANT
IRRIG SUCT STRYKERFLOW 2 WTIP (MISCELLANEOUS)
IRRIGATION SUCT STRKRFLW 2 WTP (MISCELLANEOUS) IMPLANT
KIT TURNOVER CYSTO (KITS) ×3 IMPLANT
MANIFOLD NEPTUNE II (INSTRUMENTS) IMPLANT
MESH HERNIA 6X6 BARD (Mesh General) IMPLANT
MESH HERNIA BARD 6X6 (Mesh General) ×6 IMPLANT
NEEDLE HYPO 22GX1.5 SAFETY (NEEDLE) ×3 IMPLANT
NS IRRIG 500ML POUR BTL (IV SOLUTION) ×3 IMPLANT
PACK BASIN DAY SURGERY FS (CUSTOM PROCEDURE TRAY) ×3 IMPLANT
PAD POSITIONING PINK XL (MISCELLANEOUS) ×3 IMPLANT
SCISSORS LAP 5X35 DISP (ENDOMECHANICALS) ×3 IMPLANT
SLEEVE ADV FIXATION 5X100MM (TROCAR) ×3 IMPLANT
SPONGE GAUZE 2X2 8PLY STER LF (GAUZE/BANDAGES/DRESSINGS) ×1
SPONGE GAUZE 2X2 8PLY STRL LF (GAUZE/BANDAGES/DRESSINGS) ×4 IMPLANT
SUT MNCRL AB 4-0 PS2 18 (SUTURE) ×3 IMPLANT
SUT VIC AB 2-0 SH 27 (SUTURE)
SUT VIC AB 2-0 SH 27XBRD (SUTURE) IMPLANT
SUT VICRYL 0 UR6 27IN ABS (SUTURE) IMPLANT
TOWEL OR 17X26 10 PK STRL BLUE (TOWEL DISPOSABLE) ×3 IMPLANT
TRAY DSU PREP LF (CUSTOM PROCEDURE TRAY) ×3 IMPLANT
TRAY LAPAROSCOPIC (CUSTOM PROCEDURE TRAY) ×3 IMPLANT
TROCAR ADV FIXATION 5X100MM (TROCAR) ×3 IMPLANT
TROCAR XCEL BLUNT TIP 100MML (ENDOMECHANICALS) ×3 IMPLANT
TUBING EVAC SMOKE HEATED PNEUM (TUBING) ×3 IMPLANT
WATER STERILE IRR 500ML POUR (IV SOLUTION) ×3 IMPLANT

## 2019-06-01 NOTE — Op Note (Signed)
06/01/2019  9:38 AM  PATIENT:  [name] [name]  78 y.o. male  Patient Care Team: Mosie Lukes, MD as PCP - General (Family Medicine) Leonie Man, MD as PCP - Cardiology (Cardiology) Gatha Mayer, MD as Consulting Physician (Gastroenterology) Danella Sensing, MD as Consulting Physician (Dermatology) Kathie Rhodes, MD as Consulting Physician (Urology) Leonie Man, MD as Consulting Physician (Cardiology) Lynda Rainwater, DDS as Consulting Physician (Dentistry) Michael Boston, MD as Consulting Physician (General Surgery)  PRE-OPERATIVE DIAGNOSIS:  LEFT AND POSSIBLE RIGHT INGUINAL HERNIA  POST-OPERATIVE DIAGNOSIS:   Bilateral inguinal herniae Right femoral hernia  PROCEDURE:   LAPAROSCOPIC BILATERAL INGUINAL HERNIA REPAIRS LAPAROSCOPIC RIGHT FEMORAL HERNIA REPAIR WITH MESH  SURGEON:  Adin Hector, MD  ASSISTANT: None  ANESTHESIA:     Regional ilioinguinal and genitofemoral and spermatic cord nerve blocks  General  Nerve block provided with liposomal bupivacaine (Experel) mixed with 0.25% bupivacaine as a Bilateral TAP block x 67mL each side at the level of the transverse abdominis & preperitoneal spaces along the flank at the anterior axillary line, from subcostal ridge to iliac crest under laparoscopic guidance    EBL:  Total I/O In: 700 [I.V.:700] Out: 20 [Blood:20].  See anesthesia record  Delay start of Pharmacological VTE agent (>24hrs) due to surgical blood loss or risk of bleeding:  no  DRAINS: NONE  SPECIMEN:  Spermatic cord lipomas (not sent)  DISPOSITION OF SPECIMEN:  N/A  COUNTS:  YES  PLAN OF CARE: Discharge to home after PACU  PATIENT DISPOSITION:  PACU - hemodynamically stable.  INDICATION: Pleasant patient with intermittent testicular pain and found to have large left inguinal hernia.  Possible impulse on the right.  I recommended laparoscopic exploration and repair of hernias found.  The anatomy & physiology of the abdominal  wall and pelvic floor was discussed.  The pathophysiology of hernias in the inguinal and pelvic region was discussed.  Natural history risks such as progressive enlargement, pain, incarceration & strangulation was discussed.   Contributors to complications such as smoking, obesity, diabetes, prior surgery, etc were discussed.    I feel the risks of no intervention will lead to serious problems that outweigh the operative risks; therefore, I recommended surgery to reduce and repair the hernia.  I explained laparoscopic techniques with possible need for an open approach.  I noted usual use of mesh to patch and/or buttress hernia repair  Risks such as bleeding, infection, abscess, need for further treatment, heart attack, death, and other risks were discussed.  I noted a good likelihood this will help address the problem.   Goals of post-operative recovery were discussed as well.  Possibility that this will not correct all symptoms was explained.  I stressed the importance of low-impact activity, aggressive pain control, avoiding constipation, & not pushing through pain to minimize risk of post-operative chronic pain or injury. Possibility of reherniation was discussed.  We will work to minimize complications.     An educational handout further explaining the pathology & treatment options was given as well.  Questions were answered.  The patient expresses understanding & wishes to proceed with surgery.  OR FINDINGS: On the left side, large left indirect inguinal hernia with very large spermatic cord lipomas.  No direct space, femoral, obturator hernias.  On the right side small but definite indirect right inguinal hernia and moderate size right femoral hernia.  No obturator or direct space hernias.`  DESCRIPTION:  The patient was identified & brought into the operating room.  The patient was positioned supine with arms tucked. SCDs were active during the entire case. The patient underwent general  anesthesia without any difficulty.  The abdomen was prepped and draped in a sterile fashion. The patient's bladder was emptied.  A Surgical Timeout confirmed our plan.  I made a transverse incision through the inferior umbilical fold.  I made a small transverse nick through the anterior rectus fascia contralateral to the inguinal hernia side and placed a 0-vicryl stitch through the fascia.  I placed a Hasson trocar into the preperitoneal plane.  Entry was clean.  We induced carbon dioxide insufflation. Camera inspection revealed no injury.  I used a 40mm angled scope to bluntly free the peritoneum off the infraumbilical anterior abdominal wall.  I created enough of a preperitoneal pocket to place 73mm ports into the right & left mid-abdomen into this preperitoneal cavity.  I focused attention on the LEFT pelvis since that was the dominant hernia side.   I used blunt & focused sharp dissection to free the peritoneum off the flank and down to the pubic rim.  I freed the anteriolateral bladder wall off the anteriolateral pelvic wall, sparing midline attachments.   I located a swath of peritoneum going into a hernia fascial defect at the  internal ring consistent with  an indirect inguinal hernia..  I gradually freed the peritoneal hernia sac off safely and reduced it into the preperitoneal space.  I freed the peritoneum off the spermatic vessels & vas deferens.  I freed peritoneum off the retroperitoneum along the psoas muscle.  Spermatic cord lipomas were quite large.  Eventually they were dissected away and gradually removed piecemeal out the central larger port.  I checked & assured hemostasis.     I turned attention on the opposite  RIGHT pelvis.  I did dissection in a similar, mirror-image fashion. The patient had an indirect inguinal hernia.  Also a right femoral hernia with some preperitoneal fat within it.  This was freed off along with a moderate sized spermatic cord lipoma.  They were dissected away &  removed.    I checked & assured hemostasis.     Because he had numerous hernias of moderate size, I chose 15x15 cm sheets of medium weight polypropylene mesh (Bard), one for each side.  I cut a single sigmoid-shaped slit ~6cm from a corner of each mesh.  I placed the meshes into the preperitoneal space & laid them as overlapping diamonds such that at the inferior points, a 6x6 cm corner flap rested in the true anterolateral pelvis, covering the obturator & femoral foramina.   I allowed the bladder to return to the pubis, this helping tuck the corners of the mesh in the anteriolateral pelvis.  The medial corners overlapped each other across midline cephalad to the pubic rim.   Given the numerous hernias of moderate size, I placed a third 15x15cm mesh in the center as a vertical diamond.  The lateral wings of the mesh overlap across the direct spaces and internal rings where the dominant hernias were.  This provided good coverage and reinforcement of the hernia repairs.  Because of the central mesh placement with good overlap, I did not place any tacks.   I held the hernia sacs cephalad & evacuated carbon dioxide.  I closed the fascia with absorbable suture.  I closed the skin using 4-0 monocryl stitch.  Sterile dressings were applied.   The patient was extubated & arrived in the PACU in stable condition.Marland Kitchen  I  had discussed postoperative care with the patient in the holding area.  Instructions are written in the chart.  I discussed operative findings, updated the patient's status, discussed probable steps to recovery, and gave postoperative recommendations to the patient's spouse.  Recommendations were made.  Questions were answered.  She expressed understanding & appreciation.   Adin Hector, M.D., F.A.C.S. Gastrointestinal and Minimally Invasive Surgery [facility] [address], [place], [place] [address] [phone] Main / Paging  06/01/2019 9:38 AM

## 2019-06-01 NOTE — Discharge Instructions (Signed)
[facility] REPAIR: POST OP INSTRUCTIONS ° °###################################################################### ° °EAT °Gradually transition to a high fiber diet with a fiber supplement over the next few weeks after discharge.  Start with a pureed / full liquid diet (see below) ° °WALK °Walk an hour a day.  Control your pain to do that.   ° °CONTROL PAIN °Control pain so that you can walk, sleep, tolerate sneezing/coughing, and go up/down stairs. ° °HAVE A BOWEL MOVEMENT DAILY °Keep your bowels regular to avoid problems.  OK to try a laxative to override constipation.  OK to use an antidairrheal to slow down diarrhea.  Call if not better after 2 tries ° °CALL IF YOU HAVE PROBLEMS/CONCERNS °Call if you are still struggling despite following these instructions. °Call if you have concerns not answered by these instructions ° °###################################################################### ° ° ° °1. DIET: Follow a light bland diet the first 24 hours after arrival home, such as soup, liquids, crackers, etc.  Be sure to include lots of fluids daily.  Advance to a low fat / high fiber diet over the next few days after surgery.  Avoid fast food or heavy meals the first week as your are more likely to get nauseated.   ° °2. Take your usually prescribed home medications unless otherwise directed. ° °3. PAIN CONTROL: °a. Pain is best controlled by a usual combination of three different methods TOGETHER: °i. Ice/Heat °ii. Over the counter pain medication °iii. Prescription pain medication °b. Most patients will experience some swelling and bruising around the hernia(s) such as the bellybutton, groins, or old incisions.  Ice packs or heating pads (30-60 minutes up to 6 times a day) will help. Use ice for the first few days to help decrease swelling and bruising, then switch to heat to help relax tight/sore spots and speed recovery.  Some people prefer to use ice alone, heat alone, alternating between ice & heat.  Experiment  to what works for you.  Swelling and bruising can take several weeks to resolve.   °c. It is helpful to take an over-the-counter pain medication regularly for the first few weeks.  Choose one of the following that works best for you: °i. Naproxen (Aleve, etc)  Two 220mg tabs twice a day °ii. Ibuprofen (Advil, etc) Three 200mg tabs four times a day (every meal & bedtime) °iii. Acetaminophen (Tylenol, etc) 325-650mg four times a day (every meal & bedtime) °d. A  prescription for pain medication should be given to you upon discharge.  Take your pain medication as prescribed.  °i. If you are having problems/concerns with the prescription medicine (does not control pain, nausea, vomiting, rash, itching, etc), please call us [phone] to see if we need to switch you to a different pain medicine that will work better for you and/or control your side effect better. °ii. If you need a refill on your pain medication, please contact your pharmacy.  They will contact our office to request authorization. Prescriptions will not be filled after 5 pm or on week-ends. ° °4. Avoid getting constipated.  Between the surgery and the pain medications, it is common to experience some constipation.  Increasing fluid intake and taking a fiber supplement (such as Metamucil, Citrucel, FiberCon, MiraLax, etc) 1-2 times a day regularly will usually help prevent this problem from occurring.  A mild laxative (prune juice, Milk of Magnesia, MiraLax, etc) should be taken according to package directions if there are no bowel movements after 48 hours.   ° °5. Wash / shower every   day.  You may shower over the dressings as they are waterproof.    6. Remove your waterproof bandages, skin tapes, and other bandages 5 days after surgery. You may replace a dressing/Band-Aid to cover the incision for comfort if you wish. You may leave the incisions open to air.  You may replace a dressing/Band-Aid to cover an incision for comfort if you wish.   Continue to shower over incision(s) after the dressing is off.  7. ACTIVITIES as tolerated:   a. You may resume regular (light) daily activities beginning the next day--such as daily self-care, walking, climbing stairs--gradually increasing activities as tolerated.  Control your pain so that you can walk an hour a day.  If you can walk 30 minutes without difficulty, it is safe to try more intense activity such as jogging, treadmill, bicycling, low-impact aerobics, swimming, etc. b. Save the most intensive and strenuous activity for last such as sit-ups, heavy lifting, contact sports, etc  Refrain from any heavy lifting or straining until you are off narcotics for pain control.   c. DO NOT PUSH THROUGH PAIN.  Let pain be your guide: If it hurts to do something, don't do it.  Pain is your body warning you to avoid that activity for another week until the pain goes down. d. You may drive when you are no longer taking prescription pain medication, you can comfortably wear a seatbelt, and you can safely maneuver your car and apply brakes. e. Dennis Bast may have sexual intercourse when it is comfortable.   8. FOLLOW UP in our office a. Please call [facility] at [phone] to set up an appointment to see your surgeon in the office for a follow-up appointment approximately 2-3 weeks after your surgery. b. Make sure that you call for this appointment the day you arrive home to insure a convenient appointment time.  9.  If you have disability of FMLA / Family leave forms, please bring the forms to the office for processing.  (do not give to your surgeon).  WHEN TO CALL us [phone]: 1. Poor pain control 2. Reactions / problems with new medications (rash/itching, nausea, etc)  3. Fever over 101.5 F (38.5 C) 4. Inability to urinate 5. Nausea and/or vomiting 6. Worsening swelling or bruising 7. Continued bleeding from incision. 8. Increased pain, redness, or drainage from the incision   The clinic staff is  available to answer your questions during regular business hours (8:30am-5pm).  Please dont hesitate to call and ask to speak to one of our nurses for clinical concerns.   If you have a medical emergency, go to the nearest emergency room or call 911.  A surgeon from [facility] is always on call at the hospitals in [facility], [place], [place], [place], [place]  [address] ?  [address] [address], [place], [place]   [address] MAIN: [phone] ? TOLL FREE: [phone] ? FAX: [phone] [email or web address]   GETTING TO GOOD BOWEL HEALTH.  ######################################################################  EAT Gradually transition to a high fiber diet with a fiber supplement over the next few weeks after discharge.  Start with a pureed / full liquid diet (see below)  WALK Walk an hour a day.  Control your pain to do that.    HAVE A BOWEL MOVEMENT DAILY Keep your bowels regular to avoid problems.  OK to try a laxative to override constipation.  OK to use an antidairrheal to slow down diarrhea.  Call if not  better after 2 tries  CALL IF YOU HAVE PROBLEMS/CONCERNS Call if you are still struggling despite following these instructions. Call if you have concerns not answered by these instructions  ######################################################################   Irregular bowel habits such as constipation and diarrhea can lead to many problems over time.  Having one soft bowel movement a day is the most important way to prevent further problems.  The anorectal canal is designed to handle stretching and feces to safely manage our ability to get rid of solid waste (feces, poop, stool) out of our body.  BUT, hard constipated stools can act like ripping concrete bricks and diarrhea can be a burning fire to this very sensitive area of our body, causing inflamed hemorrhoids, anal fissures, increasing risk is perirectal  abscesses, abdominal pain/bloating, an making irritable bowel worse.      The goal: ONE SOFT BOWEL MOVEMENT A DAY!  To have soft, regular bowel movements:   Drink plenty of fluids, consider 4-6 tall glasses of water a day.    Take plenty of fiber.  Fiber is the undigested part of plant food that passes into the colon, acting s natures broom to encourage bowel motility and movement.  Fiber can absorb and hold large amounts of water. This results in a larger, bulkier stool, which is soft and easier to pass. Work gradually over several weeks up to 6 servings a day of fiber (25g a day even more if needed) in the form of: o Vegetables -- Root (potatoes, carrots, turnips), leafy green (lettuce, salad greens, celery, spinach), or cooked high residue (cabbage, broccoli, etc) o Fruit -- Fresh (unpeeled skin & pulp), Dried (prunes, apricots, cherries, etc ),  or stewed ( applesauce)  o Whole grain breads, pasta, etc (whole wheat)  o Bran cereals   Bulking Agents -- This type of water-retaining fiber generally is easily obtained each day by one of the following:  o Psyllium bran -- The psyllium plant is remarkable because its ground seeds can retain so much water. This product is available as Metamucil, Konsyl, Effersyllium, Per Diem Fiber, or the less expensive generic preparation in drug and health food stores. Although labeled a laxative, it really is not a laxative.  o Methylcellulose -- This is another fiber derived from wood which also retains water. It is available as Citrucel. o Polyethylene Glycol - and artificial fiber commonly called Miralax or Glycolax.  It is helpful for people with gassy or bloated feelings with regular fiber o Flax Seed - a less gassy fiber than psyllium  No reading or other relaxing activity while on the toilet. If bowel movements take longer than 5 minutes, you are too constipated  AVOID CONSTIPATION.  High fiber and water intake usually takes care of this.  Sometimes a  laxative is needed to stimulate more frequent bowel movements, but   Laxatives are not a good long-term solution as it can wear the colon out.  They can help jump-start bowels if constipated, but should be relied on constantly without discussing with your doctor o Osmotics (Milk of Magnesia, Fleets phosphosoda, Magnesium citrate, MiraLax, GoLytely) are safer than  o Stimulants (Senokot, Castor Oil, Dulcolax, Ex Lax)    o Avoid taking laxatives for more than 7 days in a row.   IF SEVERELY CONSTIPATED, try a Bowel Retraining Program: o Do not use laxatives.  o Eat a diet high in roughage, such as bran cereals and leafy vegetables.  o Drink six (6) ounces of prune or apricot juice each  morning.  o Eat two (2) large servings of stewed fruit each day.  o Take one (1) heaping tablespoon of a psyllium-based bulking agent twice a day. Use sugar-free sweetener when possible to avoid excessive calories.  o Eat a normal breakfast.  o Set aside 15 minutes after breakfast to sit on the toilet, but do not strain to have a bowel movement.  o If you do not have a bowel movement by the third day, use an enema and repeat the above steps.   Controlling diarrhea o Switch to liquids and simpler foods for a few days to avoid stressing your intestines further. o Avoid dairy products (especially milk & ice cream) for a short time.  The intestines often can lose the ability to digest lactose when stressed. o Avoid foods that cause gassiness or bloating.  Typical foods include beans and other legumes, cabbage, broccoli, and dairy foods.  Every person has some sensitivity to other foods, so listen to our body and avoid those foods that trigger problems for you. o Adding fiber (Citrucel, Metamucil, psyllium, Miralax) gradually can help thicken stools by absorbing excess fluid and retrain the intestines to act more normally.  Slowly increase the dose over a few weeks.  Too much fiber too soon can backfire and cause cramping  & bloating. o Probiotics (such as active yogurt, Align, etc) may help repopulate the intestines and colon with normal bacteria and calm down a sensitive digestive tract.  Most studies show it to be of mild help, though, and such products can be costly. o Medicines: - Bismuth subsalicylate (ex. Kayopectate, Pepto Bismol) every 30 minutes for up to 6 doses can help control diarrhea.  Avoid if pregnant. - Loperamide (Immodium) can slow down diarrhea.  Start with two tablets (4mg  total) first and then try one tablet every 6 hours.  Avoid if you are having fevers or severe pain.  If you are not better or start feeling worse, stop all medicines and call your doctor for advice o Call your doctor if you are getting worse or not better.  Sometimes further testing (cultures, endoscopy, X-ray studies, bloodwork, etc) may be needed to help diagnose and treat the cause of the diarrhea.  TROUBLESHOOTING IRREGULAR BOWELS 1) Avoid extremes of bowel movements (no bad constipation/diarrhea) 2) Miralax 17gm mixed in 8oz. water or juice-daily. May use BID as needed.  3) Gas-x,Phazyme, etc. as needed for gas & bloating.  4) Soft,bland diet. No spicy,greasy,fried foods.  5) Prilosec over-the-counter as needed  6) May hold gluten/wheat products from diet to see if symptoms improve.  7)  May try probiotics (Align, Activa, etc) to help calm the bowels down 7) If symptoms become worse call back immediately.   Inguinal Hernia, Adult An inguinal hernia develops when fat or the intestines push through a weak spot in a muscle where your leg meets your lower abdomen (groin). This creates a bulge. This kind of hernia could also be:  In your scrotum, if you are male.  In folds of skin around your vagina, if you are male. There are three types of inguinal hernias:  Hernias that can be pushed back into the abdomen (are reducible). This type rarely causes pain.  Hernias that are not reducible (are  incarcerated).  Hernias that are not reducible and lose their blood supply (are strangulated). This type of hernia requires emergency surgery. What are the causes? This condition is caused by having a weak spot in the muscles or tissues in the groin.  This weak spot develops over time. The hernia may poke through the weak spot when you suddenly strain your lower abdominal muscles, such as when you:  Lift a heavy object.  Strain to have a bowel movement. Constipation can lead to straining.  Cough. What increases the risk? This condition is more likely to develop in:  Men.  Pregnant women.  People who: ? Are overweight. ? Work in jobs that require long periods of standing or heavy lifting. ? Have had an inguinal hernia before. ? Smoke or have lung disease. These factors can lead to long-lasting (chronic) coughing. What are the signs or symptoms? Symptoms may depend on the size of the hernia. Often, a small inguinal hernia has no symptoms. Symptoms of a larger hernia may include:  A lump in the groin area. This is easier to see when standing. It might not be visible when lying down.  Pain or burning in the groin. This may get worse when lifting, straining, or coughing.  A dull ache or a feeling of pressure in the groin.  In men, an unusual lump in the scrotum. Symptoms of a strangulated inguinal hernia may include:  A bulge in your groin that is very painful and tender to the touch.  A bulge that turns red or purple.  Fever, nausea, and vomiting.  Inability to have a bowel movement or to pass gas. How is this diagnosed? This condition is diagnosed based on your symptoms, your medical history, and a physical exam. Your health care provider may feel your groin area and ask you to cough. How is this treated? Treatment depends on the size of your hernia and whether you have symptoms. If you do not have symptoms, your health care provider may have you watch your hernia carefully  and have you come in for follow-up visits. If your hernia is large or if you have symptoms, you may need surgery to repair the hernia. Follow these instructions at home: Lifestyle  Avoid lifting heavy objects.  Avoid standing for long periods of time.  Do not use any products that contain nicotine or tobacco, such as cigarettes and e-cigarettes. If you need help quitting, ask your health care provider.  Maintain a healthy weight. Preventing constipation  Take actions to prevent constipation. Constipation leads to straining with bowel movements, which can make a hernia worse or cause a hernia repair to break down. Your health care provider may recommend that you: ? Drink enough fluid to keep your urine pale yellow. ? Eat foods that are high in fiber, such as fresh fruits and vegetables, whole grains, and beans. ? Limit foods that are high in fat and processed sugars, such as fried or sweet foods. ? Take an over-the-counter or prescription medicine for constipation. General instructions  You may try to push the hernia back in place by very gently pressing on it while lying down. Do not try to force the bulge back in if it will not push in easily.  Watch your hernia for any changes in shape, size, or color. Get help right away if you notice any changes.  Take over-the-counter and prescription medicines only as told by your health care provider.  Keep all follow-up visits as told by your health care provider. This is important. Contact a health care provider if:  You have a fever.  You develop new symptoms.  Your symptoms get worse. Get help right away if:  You have pain in your groin that suddenly gets worse.  You  have a bulge in your groin that: ? Suddenly gets bigger and does not get smaller. ? Becomes red or purple or painful to the touch.  You are a man and you have a sudden pain in your scrotum, or the size of your scrotum suddenly changes.  You cannot push the hernia  back in place by very gently pressing on it when you are lying down. Do not try to force the bulge back in if it will not push in easily.  You have nausea or vomiting that does not go away.  You have a fast heartbeat.  You cannot have a bowel movement or pass gas. These symptoms may represent a serious problem that is an emergency. Do not wait to see if the symptoms will go away. Get medical help right away. Call your local emergency services (911 in the [place] Summary  An inguinal hernia develops when fat or the intestines push through a weak spot in a muscle where your leg meets your lower abdomen (groin).  This condition is caused by having a weak spot in muscles or tissue in your groin.  Symptoms may depend on the size of the hernia, and they may include pain or swelling in your groin. A small inguinal hernia often has no symptoms.  Treatment may not be needed if you do not have symptoms. If you have symptoms or a large hernia, you may need surgery to repair the hernia.  Avoid lifting heavy objects. Also avoid standing for long amounts of time. This information is not intended to replace advice given to you by your health care provider. Make sure you discuss any questions you have with your health care provider. Document Released: 05/01/2009 Document Revised: 01/14/2018 Document Reviewed: 09/14/2017 Elsevier Interactive Patient Education  2019 [facility] Instructions  Activity: Get plenty of rest for the remainder of the day. A responsible individual must stay with you for 24 hours following the procedure.  For the next 24 hours, DO NOT: -Drive a car -Paediatric nurse -Drink alcoholic beverages -Take any medication unless instructed by your physician -Make any legal decisions or sign important papers.  Meals: Start with liquid foods such as gelatin or soup. Progress to regular foods as tolerated. Avoid greasy, spicy, heavy foods. If  nausea and/or vomiting occur, drink only clear liquids until the nausea and/or vomiting subsides. Call your physician if vomiting continues.  Special Instructions/Symptoms: Your throat may feel dry or sore from the anesthesia or the breathing tube placed in your throat during surgery. If this causes discomfort, gargle with warm salt water. The discomfort should disappear within 24 hours.  If you had a scopolamine patch placed behind your ear for the management of post- operative nausea and/or vomiting:  1. The medication in the patch is effective for 72 hours, after which it should be removed.  Wrap patch in a tissue and discard in the trash. Wash hands thoroughly with soap and water. 2. You may remove the patch earlier than 72 hours if you experience unpleasant side effects which may include dry mouth, dizziness or visual disturbances. 3. Avoid touching the patch. Wash your hands with soap and water after contact with the patch.      Information for Discharge Teaching: EXPAREL (bupivacaine liposome injectable suspension)   Your surgeon gave you EXPAREL(bupivacaine) in your surgical incision to help control your pain after surgery.   EXPAREL is a local anesthetic that provides pain relief by numbing  the tissue around the surgical site.  EXPAREL is designed to release pain medication over time and can control pain for up to 72 hours.  Depending on how you respond to EXPAREL, you may require less pain medication during your recovery.  Possible side effects:  Temporary loss of sensation or ability to move in the area where bupivacaine was injected.  Nausea, vomiting, constipation  Rarely, numbness and tingling in your mouth or lips, lightheadedness, or anxiety may occur.  Call your doctor right away if you think you may be experiencing any of these sensations, or if you have other questions regarding possible side effects.  Follow all other discharge instructions given to you by your  surgeon or nurse. Eat a healthy diet and drink plenty of water or other fluids.  If you return to the hospital for any reason within 96 hours following the administration of EXPAREL, please inform your health care providers.

## 2019-06-01 NOTE — Interval H&P Note (Signed)
History and Physical Interval Note:  06/01/2019 7:32 AM  [name] [name]  has presented today for surgery, with the diagnosis of LEFT AND POSSIBLE RIGHT INGUINAL HERNIA.  The various methods of treatment have been discussed with the patient and family. After consideration of risks, benefits and other options for treatment, the patient has consented to  Procedure(s): LAPAROSCOPIC LEFT AND POSSIBLE RIGHT INGUINAL HERNIA REPAIR WITH MESH (Left) as a surgical intervention.  The patient's history has been reviewed, patient examined, no change in status, stable for surgery.  I have reviewed the patient's chart and labs.  Questions were answered to the patient's satisfaction.    I have re-reviewed the the patient's records, history, medications, and allergies.  I have re-examined the patient.  I again discussed intraoperative plans and goals of post-operative recovery.  The patient agrees to proceed.  [name] [name]  [birth year] [id number]  Patient Care Team: Mosie Lukes, MD as PCP - General (Family Medicine) Leonie Man, MD as PCP - Cardiology (Cardiology) Gatha Mayer, MD as Consulting Physician (Gastroenterology) Danella Sensing, MD as Consulting Physician (Dermatology) Kathie Rhodes, MD as Consulting Physician (Urology) Leonie Man, MD as Consulting Physician (Cardiology) Lynda Rainwater, DDS as Consulting Physician (Dentistry) Michael Boston, MD as Consulting Physician (General Surgery)  Patient Active Problem List   Diagnosis Date Noted  . Post herpetic neuralgia 07/09/2013    Priority: High  . Groin pain, left 12/11/2018  . Obesity 08/18/2017  . Other fatigue 08/18/2017  . Obstructive sleep apnea syndrome 08/18/2017  . Vitamin D deficiency 08/18/2017  . Hyperglycemia 08/18/2017  . Lymphedema 09/21/2015  . Preventative health care 09/21/2015  . Abnormal exercise myocardial perfusion study 04/02/2015  . Persistent atrial fibrillation 03/16/2015  .  Hypertriglyceridemia 03/16/2015  . Bradycardia 12/30/2014  . Melanoma of back ([place])   . Mass of chest wall, right subfacial, 12cm s/p removal w rib resection 05/16/2014 03/08/2014  . Mass of posterior neck, SQ 4cm 03/08/2014  . Chest wall mass - right SQ anterior 5cm 08/21/2013  . OSA on CPAP 07/30/2011  . ELEVATED PROSTATE SPECIFIC ANTIGEN 01/13/2010  . LIPOMA, SKIN 08/29/2008  . GERD 08/29/2008  . BENIGN PROSTATIC HYPERTROPHY 08/29/2008    Past Medical History:  Diagnosis Date  . Anxiety   . Atrial fibrillation ([place])   . BPH (benign prostatic hypertrophy)   . Bradycardia 12/30/2014   HR routinely in mid 40s-50s  . Coronary artery disease (CAD) excluded April 2016   False-positive nuclear stress test suggesting inferior ischemia  . Elevated PSA   . Encounter for Medicare annual wellness exam 09/22/2013   Sees Dr Wilhemina Bonito of Derm Sees Dr Silvano Rusk of Gastroenterology Sees Dr Kathie Rhodes of [facility] Sees Dr Susa Day of Optometry  . Finger pain, left 04/12/2016   4th   . GERD (gastroesophageal reflux disease)    controlled w/ diet and behavioral changes, history of  . Grade I diastolic dysfunction 9233  . History of kidney stones   . Hypertriglyceridemia 03/16/2015  . Lymphadenitis   . Melanoma of back ([place])    Excised Dr Wilhemina Bonito  . Mild ascending aorta dilatation ([facility])   . New onset a-fib ([place]) 03/10/2015  . OA (osteoarthritis)   . Otitis, externa, infective 08/10/2015  . Palpitations   . Post herpetic neuralgia   . Shingles 07/09/2013  . Sleep apnea 07/30/2011   cpap- 13   . Snoring disorder 07/30/2011    Past Surgical History:  Procedure Laterality  Date  . CATARACT EXTRACTION Right   . COLONOSCOPY    . EYE SURGERY Left 12/06/2017   cataract by Dr Gillian Scarce  . LEFT HEART CATHETERIZATION WITH CORONARY ANGIOGRAM N/A 04/10/2015   Procedure: LEFT HEART CATHETERIZATION WITH  CORONARY ANGIOGRAM;  Surgeon: Leonie Man, MD;  Location: [facility] CATH LAB;  Service:  Cardiovascular;  Angiographicallly NORMAL CORONARY ARTERIES  . MASS EXCISION N/A 05/16/2014   Procedure: EXCISION POSTERIOR NECK MASS, RIGHT CHEST WALL MASS AND RIGHT AXILLARY MASS AXILLARY NODE DISSECTION;  Surgeon: Adin Hector, MD;  Location: [facility] ORS;  Service: General;  Laterality: N/A;  . NM MYOVIEW LTD  03/19/2015   FALSE POSITIVE:  INTERMEDIATE RISK. Small sized, moderate intensity inferior ischemic perfusion defect  . TRANSTHORACIC ECHOCARDIOGRAM  03/20/2015   Normal LV size and function. EF 60-65%. G1 DD. Trivial AI. Borderline aortic root dilation (41 mm), Mild LA dilation.    Social History   Socioeconomic History  . Marital status: Married    Spouse name: [name]  . Number of children: 2  . Years of education: Not on file  . Highest education level: Not on file  Occupational History  . 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 Financial resource strain: Not on file  . Food insecurity:    Worry: Not on file    Inability: Not on file  . Transportation needs:    Medical: Not on file    Non-medical: Not on file  Tobacco Use  . Smoking status: Former Smoker    Types: Pipe, Cigarettes    Last attempt to quit: 02/11/1989    Years since quitting: 30.3  . Smokeless tobacco: Never Used  Substance and Sexual Activity  . Alcohol use: Yes    Alcohol/week: 7.0 standard drinks    Types: 7 Glasses of wine per week    Comment: 1 per day -- brandy or wine  . Drug use: No  . 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 Physical activity:    Days per week: Not on file    Minutes per session: Not on file  . Stress: Not on file  Relationships  . Social connections:    Talks on phone: Not on file    Gets together: Not on file    Attends religious service: Not on file    Active member of club or organization: Not on file    Attends meetings of clubs or organizations: Not on file     Relationship status: Not on file  . Intimate partner violence:    Fear of current or ex partner: Not on file    Emotionally abused: Not on file    Physically abused: Not on file    Forced sexual activity: Not on file  Other Topics Concern  . Not on file  Social History Narrative   He is a married father of 2, and grandfather of 77. He has been married to his wife [name] for 51 years. He previously lived in [place] but has moved with his wife to [place], [place] to be close to his daughter [name] and her family. [name] is our Nurse, adult.   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 He quit smoking in 1990. He has 6-7 glasses of brandy or wine a week.    He exercises regularly with low impact aerobics  2 days a week and daily walks of 30-45 minutes. His exercise sessions or 60 minutes. He will do some type of exercise at least 7 days a week and is very active doing yard work and wood work.      Patient is right-handed. He lives with his wife in a one level home with a basement. He drinks 4-5 cups of decaf coffee a day. He and his wife go to the gym 1-2 x a week.    Family History  Problem Relation Age of Onset  . Cancer Mother 63       MM, leukemia  . Obesity Mother   . Emphysema Father 86  . Obesity Father   . Alcohol abuse Father   . Cancer Sister 39       brain  . COPD Brother 46  . Heart disease Paternal Grandmother   . COPD Paternal Grandfather   . Diabetes Sister 42  . Obesity Sister   . Down syndrome Brother 66       aspirated  . Alcohol abuse Other   . Colon cancer Neg Hx     Medications Prior to Admission  Medication Sig Dispense Refill Last Dose  . Cholecalciferol (VITAMIN D3) 5000 units CAPS Take 1 capsule by mouth daily.   05/31/2019 at Unknown time  . ELIQUIS 5 MG TABS tablet Take 1 tablet by mouth twice daily 180 tablet 0 05/31/2019 at Unknown time  . gabapentin (NEURONTIN) 300 MG capsule Take 3 capsules (900 mg  total) by mouth 3 (three) times daily. 270 capsule 5 06/01/2019 at Unknown time  . Multiple Vitamin (MULTIVITAMIN WITH MINERALS) TABS tablet Take 1 tablet by mouth daily.   05/31/2019 at Unknown time  . diazepam (VALIUM) 5 MG tablet Take 1 tablet (5 mg total) by mouth every 6 (six) hours as needed for anxiety. 30 tablet 1 Unknown at Unknown time    Current Facility-Administered Medications  Medication Dose Route Frequency Provider Last Rate Last Dose  . bupivacaine liposome (EXPAREL) 1.3 % injection 266 mg  20 mL Infiltration Once Michael Boston, MD      . ceFAZolin (ANCEF) IVPB 2g/100 mL premix  2 g Intravenous On Call to OR Michael Boston, MD      . Chlorhexidine Gluconate Cloth 2 % PADS 6 each  6 each Topical Once Michael Boston, MD       And  . Chlorhexidine Gluconate Cloth 2 % PADS 6 each  6 each Topical Once Michael Boston, MD      . gabapentin (NEURONTIN) capsule 300 mg  300 mg Oral Once Barnet Glasgow, MD      . lactated ringers infusion   Intravenous Continuous Myrtie Soman, MD 50 mL/hr at 06/01/19 [phone]       No Known Allergies  BP 110/82   Pulse 63   Temp 97.9 F (36.6 C) (Oral)   Resp 16   Ht 5\' 10"  (1.778 m)   Wt 97.4 kg   SpO2 99%   BMI 30.81 kg/m   Labs: No results found for this or any previous visit (from the past 48 hour(s)).  Imaging / Studies: No results found.   Adin Hector, M.D., F.A.C.S. Gastrointestinal and Minimally Invasive Surgery [facility] [address] [place], [place] [address] [phone] Main / Paging  06/01/2019 7:32 AM    Adin Hector

## 2019-06-01 NOTE — Anesthesia Procedure Notes (Signed)
Procedure Name: Intubation Date/Time: 06/01/2019 7:40 AM Performed by: Lieutenant Diego, CRNA Pre-anesthesia Checklist: Patient identified, Emergency Drugs available, Suction available and Patient being monitored Patient Re-evaluated:Patient Re-evaluated prior to induction Oxygen Delivery Method: Circle system utilized Preoxygenation: Pre-oxygenation with 100% oxygen Induction Type: IV induction Ventilation: Mask ventilation without difficulty Laryngoscope Size: Miller and 2 Grade View: Grade II Tube type: Oral Tube size: 7.5 mm Number of attempts: 1 Airway Equipment and Method: Stylet and Oral airway Placement Confirmation: ETT inserted through vocal cords under direct vision,  positive ETCO2 and breath sounds checked- equal and bilateral Secured at: 23 cm Tube secured with: Tape Dental Injury: Teeth and Oropharynx as per pre-operative assessment

## 2019-06-01 NOTE — Anesthesia Postprocedure Evaluation (Signed)
Anesthesia Post Note  Patient: [name] [name]  Procedure(s) Performed: LAPAROSCOPIC LEFT AND  RIGHT INGUINAL HERNIA REPAIR WITH MESH (Bilateral )     Patient location during evaluation: PACU Anesthesia Type: General Level of consciousness: awake and alert Pain management: pain level controlled Vital Signs Assessment: post-procedure vital signs reviewed and stable Respiratory status: spontaneous breathing, nonlabored ventilation, respiratory function stable and patient connected to nasal cannula oxygen Cardiovascular status: blood pressure returned to baseline and stable Postop Assessment: no apparent nausea or vomiting Anesthetic complications: no    Last Vitals:  Vitals:   06/01/19 1045 06/01/19 1200  BP: (!) 143/126 126/89  Pulse: 99 (!) 109  Resp: 16 16  Temp:  36.4 C  SpO2: 96% 96%    Last Pain:  Vitals:   06/01/19 1200  TempSrc:   PainSc: 0-No pain                 Barnet Glasgow

## 2019-06-01 NOTE — H&P (Signed)
[name] [name] DOB: Jan 05, 1941  Patient Care Team: Mosie Lukes, MD as PCP - General (Family Medicine) Leonie Man, MD as PCP - Cardiology (Cardiology) Gatha Mayer, MD as Consulting Physician (Gastroenterology) Danella Sensing, MD as Consulting Physician (Dermatology) Kathie Rhodes, MD as Consulting Physician (Urology) Leonie Man, MD as Consulting Physician (Cardiology) Lynda Rainwater, DDS as Consulting Physician (Dentistry) Michael Boston, MD as Consulting Physician (General Surgery)   ` ` Patient sent for surgical consultation at the request of Dr Karsten Ro, [facility]  Chief Complaint: Left groin pain. Possible hernia. ` ` [name] whose had some left groin pain for quite some time. Was sent to urology. Evaluation argued against any postvasectomy pain or any scrotal issues. No definite hernia felt that wish to offer second opinion since the history seen to be quite suspicious. I excised some lipomas off his right trunk and neck 5 years ago.  Patient notes he gets a sharp left groin pain usually with prolonged standing. Sometimes during twisting. Usually relieved if he lies down or sits. No major problems urination or defecation. Moves his bowels every other day a few times. He history vasectomy in the 1970s. As it was a persistent nagging problem and discuss with his primary care physician. They wondered if he perhaps had an irritated spermatic cord send him to urology. They felt otherwise. Patient can walk 30 minutes without difficulty. He has episodes of intermittent atrial fibrillation is chronic antiquated on Eliquis. Follow by Dr. Ellyn Hack. No major cardiac issues. He does have some chronic pain issues for which she controls with Lyrica and Neurontin. He's not had any other abdominal surgeries. He comes today with his wife. He thinks is only minimal month but she thinks it's been symptoms for several months. He's  never really noticed a bulge but definitely positional pain. Not related to eating or bowel movements. No major issue with straining   (Review of systems as stated in this history (HPI) or in the review of systems. Otherwise all other 12 point ROS are negative) ` ` `   Diagnostic Studies History Alean Rinne, [place]; 01/02/2019 10:36 AM) Colonoscopy 5-10 years ago  Allergies Andreas Blower, [place]; 01/02/2019 10:33 AM) No Known Drug Allergies [01/02/2019]:  Medication History (Armen Ferguson, CMA; 01/02/2019 10:34 AM) Eliquis (5MG  Tablet, Oral) Active. Gabapentin (300MG  Capsule, Oral) Active. Lyrica (75MG  Capsule, Oral) Active. Vitamin D (Cholecalciferol) (1000UNIT Capsule, Oral) Active. Multivitamin Adult (Oral) Active. Valium (5MG  Tablet, Oral) Active. Medications Reconciled  Social History Alean Rinne, [place]; 01/02/2019 10:36 AM) Alcohol use Moderate alcohol use. No caffeine use No drug use Tobacco use Former smoker.  Family History Alean Rinne, [place]; 01/02/2019 10:36 AM) Alcohol Abuse Father, Sister. Cancer Mother, Sister. Respiratory Condition Father.  Other Problems Alean Rinne, [place]; 01/02/2019 10:36 AM) Atrial Fibrillation Enlarged Prostate Kidney Stone Sleep Apnea     Review of Systems Alean Rinne RMA; 01/02/2019 10:36 AM) General Not Present- Appetite Loss, Chills, Fatigue, Fever, Night Sweats, Weight Gain and Weight Loss. Skin Not Present- Change in Wart/Mole, Dryness, Hives, Jaundice, New Lesions, Non-Healing Wounds, Rash and Ulcer. HEENT Present- Wears glasses/contact lenses. Not Present- Earache, Hearing Loss, Hoarseness, Nose Bleed, Oral Ulcers, Ringing in the Ears, Seasonal Allergies, Sinus Pain, Sore Throat, Visual Disturbances and Yellow Eyes. Respiratory Not Present- Bloody sputum, Chronic Cough, Difficulty Breathing, Snoring and Wheezing. Breast Not Present- Breast Mass, Breast Pain, Nipple Discharge and Skin  Changes. Cardiovascular Not Present- Chest Pain, Difficulty Breathing Lying Down, Leg Cramps,  Palpitations, Rapid Heart Rate, Shortness of Breath and Swelling of Extremities. Gastrointestinal Not Present- Abdominal Pain, Bloating, Bloody Stool, Change in Bowel Habits, Chronic diarrhea, Constipation, Difficulty Swallowing, Excessive gas, Gets full quickly at meals, Hemorrhoids, Indigestion, Nausea, Rectal Pain and Vomiting. Male Genitourinary Present- Urgency. Not Present- Blood in Urine, Change in Urinary Stream, Frequency, Impotence, Nocturia, Painful Urination and Urine Leakage.  Vitals (Armen Ferguson CMA; 01/02/2019 10:33 AM) 01/02/2019 10:32 AM Weight: 217 lb Height: 70in Body Surface Area: 2.16 m Body Mass Index: 31.14 kg/m  Temp.: 97.19F  Pulse: 75 (Regular)  P.OX: 98% (Room air) BP: 120/90 (Sitting, Left Arm, Standard)   06/01/2019 BP 110/82    Pulse 63    Temp 97.9 F (36.6 C) (Oral)    Resp 16    Ht 5\' 10"  (1.778 m)    Wt 97.4 kg    SpO2 99%    BMI 30.81 kg/m        Physical Exam Adin Hector MD; 01/02/2019 11:07 AM)  General Mental Status-Alert. General Appearance-Not in acute distress, Not Sickly. Orientation-Oriented X3. Hydration-Well hydrated. Voice-Normal.  Integumentary Global Assessment Upon inspection and palpation of skin surfaces of the - Axillae: non-tender, no inflammation or ulceration, no drainage. and Distribution of scalp and body hair is normal. General Characteristics Temperature - normal warmth is noted.  Head and Neck Head-normocephalic, atraumatic with no lesions or palpable masses. Face Global Assessment - atraumatic, no absence of expression. Neck Global Assessment - no abnormal movements, no bruit auscultated on the right, no bruit auscultated on the left, no decreased range of motion, non-tender. Trachea-midline. Thyroid Gland Characteristics - non-tender.  Eye Eyeball - Left-Extraocular movements  intact, No Nystagmus. Eyeball - Right-Extraocular movements intact, No Nystagmus. Cornea - Left-No Hazy. Cornea - Right-No Hazy. Sclera/Conjunctiva - Left-No scleral icterus, No Discharge. Sclera/Conjunctiva - Right-No scleral icterus, No Discharge. Pupil - Left-Direct reaction to light normal. Pupil - Right-Direct reaction to light normal.  ENMT Ears Pinna - Left - no drainage observed, no generalized tenderness observed. Right - no drainage observed, no generalized tenderness observed. Nose and Sinuses External Inspection of the Nose - no destructive lesion observed. Inspection of the nares - Left - quiet respiration. Right - quiet respiration. Mouth and Throat Lips - Upper Lip - no fissures observed, no pallor noted. Lower Lip - no fissures observed, no pallor noted. Nasopharynx - no discharge present. Oral Cavity/Oropharynx - Tongue - no dryness observed. Oral Mucosa - no cyanosis observed. Hypopharynx - no evidence of airway distress observed.  Chest and Lung Exam Inspection Movements - Normal and Symmetrical. Accessory muscles - No use of accessory muscles in breathing. Palpation Palpation of the chest reveals - Non-tender. Auscultation Breath sounds - Normal and Clear.  Cardiovascular Auscultation Rhythm - Regular. Murmurs & Other Heart Sounds - Auscultation of the heart reveals - No Murmurs and No Systolic Clicks.  Abdomen Inspection Inspection of the abdomen reveals - No Visible peristalsis and No Abnormal pulsations. Umbilicus - No Bleeding, No Urine drainage. Palpation/Percussion Palpation and Percussion of the abdomen reveal - Soft, Non Tender, No Rebound tenderness, No Rigidity (guarding) and No Cutaneous hyperesthesia. Note: Abdomen soft. Mild supraumbilical diastases recti. Nontender. Not distended. No umbilical or incisional hernias. No guarding.  Male Genitourinary Sexual Maturity Tanner 5 - Adult hair pattern and Adult penile size and  shape. Note: Normal external male genitalia. Right testicle lower than left. Testicles epididymides and cords otherwise normal with no major abnormalities.  Definite persistent impulse with Valsalva at left external  ring while standing consistent with small indirect inguinal hernia. That does reproduce his pain and discomfort. No definite hernia on the right side.  Peripheral Vascular Upper Extremity Inspection - Left - No Cyanotic nailbeds, Not Ischemic. Right - No Cyanotic nailbeds, Not Ischemic.  Neurologic Neurologic evaluation reveals -normal attention span and ability to concentrate, able to name objects and repeat phrases. Appropriate fund of knowledge , normal sensation and normal coordination. Mental Status Affect - not angry, not paranoid. Cranial Nerves-Normal Bilaterally. Gait-Normal.  Neuropsychiatric Mental status exam performed with findings of-able to articulate well with normal speech/language, rate, volume and coherence, thought content normal with ability to perform basic computations and apply abstract reasoning and no evidence of hallucinations, delusions, obsessions or homicidal/suicidal ideation.  Musculoskeletal Global Assessment Spine, Ribs and Pelvis - no instability, subluxation or laxity. Right Upper Extremity - no instability, subluxation or laxity.  Lymphatic Head & Neck  General Head & Neck Lymphatics: Bilateral - Description - No Localized lymphadenopathy. Axillary  General Axillary Region: Bilateral - Description - No Localized lymphadenopathy. Femoral & Inguinal  Generalized Femoral & Inguinal Lymphatics: Left - Description - No Localized lymphadenopathy. Right - Description - No Localized lymphadenopathy.    Assessment & Plan   LEFT INGUINAL HERNIA (K40.90) Definite LIH.  Lap exploration & repair   The anatomy & physiology of the abdominal wall and pelvic floor was discussed. The pathophysiology of hernias in the  inguinal and pelvic region was discussed. Natural history risks such as progressive enlargement, pain, incarceration, and strangulation was discussed. Contributors to complications such as smoking, obesity, diabetes, prior surgery, etc were discussed.  I feel the risks of no intervention will lead to serious problems that outweigh the operative risks; therefore, I recommended surgery to reduce and repair the hernia. I explained laparoscopic techniques with possible need for an open approach. I noted usual use of mesh to patch and/or buttress hernia repair  Risks such as bleeding, infection, abscess, need for further treatment, heart attack, death, and other risks were discussed. I noted a good likelihood this will help address the problem. Goals of post-operative recovery were discussed as well. Possibility that this will not correct all symptoms was explained. I stressed the importance of low-impact activity, aggressive pain control, avoiding constipation, & not pushing through pain to minimize risk of post-operative chronic pain or injury. Possibility of reherniation was discussed. We will work to minimize complications.  An educational handout further explaining the pathology & treatment options was given as well. Questions were answered. The patient expresses understanding & wishes to proceed with surgery.  Pt Education - Pamphlet Given - Laparoscopic Hernia Repair: discussed with patient and provided information. Pt Education - CCS Pain Control (Guy Toney) Pt Education - [facility] Hernia Post-Op HCI (Arpita Fentress): discussed with patient and provided information. Pt Education - [facility]CS Mesh education: discussed with patient and provided information.  CHRONIC A-FIB (I48.20)  Current Plans I recommended obtaining preoperative cardiac clearance for recommendations on management of anticoagualtion perioperatively:  1. Timing of holding anticoagulation 2. Need for any bridge therapy (SQ  enoxaparin, IV heparin, IV Aggrastat, etc) preop/postop. 3. Desired timing of resumption of anticoagulation.  In general from Dr. Johney Maine' standpoint:  Aspirin is okay to continue perioperatively (81mg  or 325mg ) & does not need to be held  Hold warfarin 5 dayspreoperatively. Consider PT/INR level on arrival to short stay the day of surgery. No need to check PT/INR level on preop visit  Hold P2Y12 inibitors such as clopidrogel (Plavix) 4 dayspreoperatively  Hold direct  thrombin / factor Xa inhibitors (Xerelto, Pradaxa, Eliquis, etc) 2 dayspreoperatively   Request clearance by cardiology to better assess operative risk & see if a reevaluation, further workup, etc is needed. Also recommendations on how medications such as for anticoagulation and blood pressure should be managed/held/restarted after surgery.   ANTICOAGULATED (Z79.01)  Adin Hector, MD, FACS, MASCRS Gastrointestinal and Minimally Invasive Surgery    [address] [place], [place] [address] [phone] Main / Paging [phone] Fax

## 2019-06-01 NOTE — Transfer of Care (Signed)
Immediate Anesthesia Transfer of Care Note  Patient: [name] [name]  Procedure(s) Performed: LAPAROSCOPIC LEFT AND  RIGHT INGUINAL HERNIA REPAIR WITH MESH (Bilateral )  Patient Location: PACU  Anesthesia Type:General  Level of Consciousness: drowsy  Airway & Oxygen Therapy: Patient Spontanous Breathing and Patient connected to nasal cannula oxygen  Post-op Assessment: Report given to RN and Post -op Vital signs reviewed and stable  Post vital signs: Reviewed and stable  Last Vitals:  Vitals Value Taken Time  BP 151/96 06/01/2019  9:45 AM  Temp    Pulse 98 06/01/2019  9:46 AM  Resp 10 06/01/2019  9:46 AM  SpO2 95 % 06/01/2019  9:46 AM  Vitals shown include unvalidated device data.  Last Pain:  Vitals:   06/01/19 0549  TempSrc: Oral         Complications: No apparent anesthesia complications

## 2019-06-04 ENCOUNTER — Encounter (HOSPITAL_BASED_OUTPATIENT_CLINIC_OR_DEPARTMENT_OTHER): Payer: Self-pay | Admitting: Surgery

## 2019-06-11 ENCOUNTER — Other Ambulatory Visit: Payer: Self-pay | Admitting: Cardiology

## 2019-06-22 ENCOUNTER — Ambulatory Visit: Payer: Medicare Other | Admitting: Family Medicine

## 2019-07-09 ENCOUNTER — Other Ambulatory Visit: Payer: Self-pay

## 2019-07-09 ENCOUNTER — Encounter: Payer: Self-pay | Admitting: Family Medicine

## 2019-07-09 ENCOUNTER — Ambulatory Visit: Payer: Self-pay

## 2019-07-09 ENCOUNTER — Other Ambulatory Visit: Payer: Self-pay | Admitting: Family Medicine

## 2019-07-09 ENCOUNTER — Ambulatory Visit (INDEPENDENT_AMBULATORY_CARE_PROVIDER_SITE_OTHER): Payer: Medicare Other | Admitting: Family Medicine

## 2019-07-09 VITALS — BP 112/70 | HR 78 | Temp 97.5°F | Ht 70.0 in | Wt 223.5 lb

## 2019-07-09 DIAGNOSIS — M545 Low back pain, unspecified: Secondary | ICD-10-CM

## 2019-07-09 DIAGNOSIS — R3989 Other symptoms and signs involving the genitourinary system: Secondary | ICD-10-CM

## 2019-07-09 LAB — URINALYSIS, ROUTINE W REFLEX MICROSCOPIC
Bilirubin Urine: NEGATIVE
Ketones, ur: NEGATIVE
Nitrite: NEGATIVE
Specific Gravity, Urine: 1.01 (ref 1.000–1.030)
Total Protein, Urine: 30 — AB
Urine Glucose: NEGATIVE
Urobilinogen, UA: 0.2 (ref 0.0–1.0)
pH: 5.5 (ref 5.0–8.0)

## 2019-07-09 MED ORDER — TIZANIDINE HCL 4 MG PO TABS: 4.0000 mg | ORAL_TABLET | Freq: Three times a day (TID) | ORAL | 0 refills | Status: DC | PRN

## 2019-07-09 MED ORDER — METHYLPREDNISOLONE ACETATE 80 MG/ML IJ SUSP
80.0000 mg | Freq: Once | INTRAMUSCULAR | Status: AC
  Administered 2019-07-09: 80 mg via INTRAMUSCULAR

## 2019-07-09 MED ORDER — PREDNISONE 20 MG PO TABS: 40.0000 mg | ORAL_TABLET | Freq: Every day | ORAL | 0 refills | Status: AC

## 2019-07-09 NOTE — Progress Notes (Signed)
Musculoskeletal Exam  Patient: [name] [name] DOB: [birth year]  DOS: 07/09/2019  SUBJECTIVE:  Chief Complaint:   Chief Complaint  Patient presents with  . Back Pain    left side    [name] [name] is a 78 y.o.  male for evaluation and treatment of his back pain.   Onset:  2 weeks ago. Drove to [place].  Location: L lower Character:  sharp  Progression of issue:  is unchanged Associated symptoms: difficult walking Denies bowel/bladder incontinence or weakness Treatment: to date has been ice, OTC NSAIDS and Valium.   Neurovascular symptoms: no  Had tea colored urine for several days. No inj. No bleeding, but did look dark. Resolved on own. No illness, diet changes, decreased PO intake of fluids.   ROS: Musculoskeletal/Extremities: +back pain Neurologic: no numbness, tingling no weakness   Past Medical History:  Diagnosis Date  . Anxiety   . Atrial fibrillation ([place])   . BPH (benign prostatic hypertrophy)   . Bradycardia 12/30/2014   HR routinely in mid 40s-50s  . Coronary artery disease (CAD) excluded April 2016   False-positive nuclear stress test suggesting inferior ischemia  . Elevated PSA   . Encounter for Medicare annual wellness exam 09/22/2013   Sees Dr Wilhemina Bonito of Derm Sees Dr Silvano Rusk of Gastroenterology Sees Dr Kathie Rhodes of [facility] Sees Dr Susa Day of Optometry  . Finger pain, left 04/12/2016   4th   . GERD (gastroesophageal reflux disease)    controlled w/ diet and behavioral changes, history of  . Grade I diastolic dysfunction 3818  . History of kidney stones   . Hypertriglyceridemia 03/16/2015  . Lymphadenitis   . Melanoma of back ([place])    Excised Dr Wilhemina Bonito  . Mild ascending aorta dilatation ([facility])   . New onset a-fib ([place]) 03/10/2015  . OA (osteoarthritis)   . Otitis, externa, infective 08/10/2015  . Palpitations   . Post herpetic neuralgia   . Shingles 07/09/2013  . Sleep apnea 07/30/2011   cpap- 13   . Snoring disorder  07/30/2011    Objective:  VITAL SIGNS: BP 112/70 (BP Location: Left Arm, Patient Position: Sitting, Cuff Size: Normal)   Pulse 78   Temp (!) 97.5 F (36.4 C) (Oral)   Ht 5\' 10"  (1.778 m)   Wt 223 lb 8 oz (101.4 kg)   SpO2 98%   BMI 32.07 kg/m  Constitutional: Well formed, well developed. No acute distress. Cardiac: RRR Thorax & Lungs:  CTAB, No accessory muscle use Abd: BS+. S, mildly distended, NT, no masses or organomegaly Skin: Warm. Dry. No erythema. No rash.  Musculoskeletal: low back.   Tenderness to palpation: yes, over distal L ES group Deformity: no Ecchymosis: no Straight leg test: negative for Poor hamstring flexibility b/l. Neurologic: Normal sensory function. No focal deficits noted. DTR's equal and symmetric in LE's. No clonus. Psychiatric: Normal mood. Age appropriate judgment and insight. Alert & oriented x 3.    Assessment:  Acute left-sided low back pain without sciatica - Plan: predniSONE (DELTASONE) 20 MG tablet, tiZANidine (ZANAFLEX) 4 MG tablet, ice, heat, stretches/exercises. Send message in 3-4 weeks and will initiate PT.   Urine discoloration - Plan: ck urine microscopy  Plan: Orders as above. F/u prn. The patient voiced understanding and agreement to the plan.   [place], DO 07/09/19  11:36 AM

## 2019-07-09 NOTE — Telephone Encounter (Signed)
Pt called stating that he has had tea colored urine since Thursday. He states that he has left side back pain that is severe. He take a blood thinner and has recently had abdominal surgery for hernia repair. He has no fever. No there symptoms No clots. He feels his urine is clearing some over the past day. Care advice read to patient. Call transferred to office for scheduling.  Reason for Disposition . Taking Coumadin (warfarin) or other strong blood thinner, or known bleeding disorder (e.g., thrombocytopenia)  Answer Assessment - Initial Assessment Questions 1. COLOR of URINE: "Describe the color of the urine."  (e.g., tea-colored, pink, red, blood clots, bloody)     tea 2. ONSET: "When did the bleeding start?"      thursdAY 3. EPISODES: "How many times has there been blood in the urine?" or "How many times today?"    MOST EVERY TIME 4. PAIN with URINATION: "Is there any pain with passing your urine?" If so, ask: "How bad is the pain?"  (Scale 1-10; or mild, moderate, severe)    - MILD - complains slightly about urination hurting    - MODERATE - interferes with normal activities      - SEVERE - excruciating, unwilling or unable to urinate because of the pain      no 5. FEVER: "Do you have a fever?" If so, ask: "What is your temperature, how was it measured, and when did it start?"     no 6. ASSOCIATED SYMPTOMS: "Are you passing urine more frequently than usual?"     No back pain 7. OTHER SYMPTOMS: "Do you have any other symptoms?" (e.g., back/flank pain, abdominal pain, vomiting)    Left flank 8. PREGNANCY: "Is there any chance you are pregnant?" "When was your last menstrual period?"    N/A  Protocols used: URINE - BLOOD IN-A-AH

## 2019-07-09 NOTE — Progress Notes (Signed)
ur

## 2019-07-09 NOTE — Patient Instructions (Signed)
Heat (pad or rice pillow in microwave) over affected area, 10-15 minutes twice daily.   OK to take Tylenol 1000 mg (2 extra strength tabs) or 975 mg (3 regular strength tabs) every 6 hours as needed.  Ice/cold pack over area for 10-15 min twice daily.  Give [name] a few days to get the results of your urine testing back.  EXERCISES  RANGE OF MOTION (ROM) AND STRETCHING EXERCISES - Low Back Pain Most people with lower back pain will find that their symptoms get worse with excessive bending forward (flexion) or arching at the lower back (extension). The exercises that will help resolve your symptoms will focus on the opposite motion.  If you have pain, numbness or tingling which travels down into your buttocks, leg or foot, the goal of the therapy is for these symptoms to move closer to your back and eventually resolve. Sometimes, these leg symptoms will get better, but your lower back pain may worsen. This is often an indication of progress in your rehabilitation. Be very alert to any changes in your symptoms and the activities in which you participated in the 24 hours prior to the change. Sharing this information with your caregiver will allow him or her to most efficiently treat your condition. These exercises may help you when beginning to rehabilitate your injury. Your symptoms may resolve with or without further involvement from your physician, physical therapist or athletic trainer. While completing these exercises, remember:   Restoring tissue flexibility helps normal motion to return to the joints. This allows healthier, less painful movement and activity.  An effective stretch should be held for at least 30 seconds.  A stretch should never be painful. You should only feel a gentle lengthening or release in the stretched tissue. FLEXION RANGE OF MOTION AND STRETCHING EXERCISES:  STRETCH - Flexion, Single Knee to Chest   Lie on a firm bed or floor with both legs extended in front of you.   Keeping one leg in contact with the floor, bring your opposite knee to your chest. Hold your leg in place by either grabbing behind your thigh or at your knee.  Pull until you feel a gentle stretch in your low back. Hold 30 seconds.  Slowly release your grasp and repeat the exercise with the opposite side. Repeat 2 times. Complete this exercise 3 times per week.   STRETCH - Flexion, Double Knee to Chest  Lie on a firm bed or floor with both legs extended in front of you.  Keeping one leg in contact with the floor, bring your opposite knee to your chest.  Tense your stomach muscles to support your back and then lift your other knee to your chest. Hold your legs in place by either grabbing behind your thighs or at your knees.  Pull both knees toward your chest until you feel a gentle stretch in your low back. Hold 30 seconds.  Tense your stomach muscles and slowly return one leg at a time to the floor. Repeat 2 times. Complete this exercise 3 times per week.   STRETCH - Low Trunk Rotation  Lie on a firm bed or floor. Keeping your legs in front of you, bend your knees so they are both pointed toward the ceiling and your feet are flat on the floor.  Extend your arms out to the side. This will stabilize your upper body by keeping your shoulders in contact with the floor.  Gently and slowly drop both knees together to one side until  you feel a gentle stretch in your low back. Hold for 30 seconds.  Tense your stomach muscles to support your lower back as you bring your knees back to the starting position. Repeat the exercise to the other side. Repeat 2 times. Complete this exercise at least 3 times per week.   EXTENSION RANGE OF MOTION AND FLEXIBILITY EXERCISES:  STRETCH - Extension, Prone on Elbows   Lie on your stomach on the floor, a bed will be too soft. Place your palms about shoulder width apart and at the height of your head.  Place your elbows under your shoulders. If this is  too painful, stack pillows under your chest.  Allow your body to relax so that your hips drop lower and make contact more completely with the floor.  Hold this position for 30 seconds.  Slowly return to lying flat on the floor. Repeat 2 times. Complete this exercise 3 times per week.   RANGE OF MOTION - Extension, Prone Press Ups  Lie on your stomach on the floor, a bed will be too soft. Place your palms about shoulder width apart and at the height of your head.  Keeping your back as relaxed as possible, slowly straighten your elbows while keeping your hips on the floor. You may adjust the placement of your hands to maximize your comfort. As you gain motion, your hands will come more underneath your shoulders.  Hold this position 30 seconds.  Slowly return to lying flat on the floor. Repeat 2 times. Complete this exercise 3 times per week.   RANGE OF MOTION- Quadruped, Neutral Spine   Assume a hands and knees position on a firm surface. Keep your hands under your shoulders and your knees under your hips. You may place padding under your knees for comfort.  Drop your head and point your tailbone toward the ground below you. This will round out your lower back like an angry cat. Hold this position for 30 seconds.  Slowly lift your head and release your tail bone so that your back sags into a large arch, like an old horse.  Hold this position for 30 seconds.  Repeat this until you feel limber in your low back.  Now, find your "sweet spot." This will be the most comfortable position somewhere between the two previous positions. This is your neutral spine. Once you have found this position, tense your stomach muscles to support your low back.  Hold this position for 30 seconds. Repeat 2 times. Complete this exercise 3 times per week.   STRENGTHENING EXERCISES - Low Back Sprain These exercises may help you when beginning to rehabilitate your injury. These exercises should be done near  your "sweet spot." This is the neutral, low-back arch, somewhere between fully rounded and fully arched, that is your least painful position. When performed in this safe range of motion, these exercises can be used for people who have either a flexion or extension based injury. These exercises may resolve your symptoms with or without further involvement from your physician, physical therapist or athletic trainer. While completing these exercises, remember:   Muscles can gain both the endurance and the strength needed for everyday activities through controlled exercises.  Complete these exercises as instructed by your physician, physical therapist or athletic trainer. Increase the resistance and repetitions only as guided.  You may experience muscle soreness or fatigue, but the pain or discomfort you are trying to eliminate should never worsen during these exercises. If this pain does worsen,  stop and make certain you are following the directions exactly. If the pain is still present after adjustments, discontinue the exercise until you can discuss the trouble with your caregiver.  STRENGTHENING - Deep Abdominals, Pelvic Tilt   Lie on a firm bed or floor. Keeping your legs in front of you, bend your knees so they are both pointed toward the ceiling and your feet are flat on the floor.  Tense your lower abdominal muscles to press your low back into the floor. This motion will rotate your pelvis so that your tail bone is scooping upwards rather than pointing at your feet or into the floor. With a gentle tension and even breathing, hold this position for 3 seconds. Repeat 2 times. Complete this exercise 3 times per week.   STRENGTHENING - Abdominals, Crunches   Lie on a firm bed or floor. Keeping your legs in front of you, bend your knees so they are both pointed toward the ceiling and your feet are flat on the floor. Cross your arms over your chest.  Slightly tip your chin down without bending your  neck.  Tense your abdominals and slowly lift your trunk high enough to just clear your shoulder blades. Lifting higher can put excessive stress on the lower back and does not further strengthen your abdominal muscles.  Control your return to the starting position. Repeat 2 times. Complete this exercise 3 times per week.   STRENGTHENING - Quadruped, Opposite UE/LE Lift   Assume a hands and knees position on a firm surface. Keep your hands under your shoulders and your knees under your hips. You may place padding under your knees for comfort.  Find your neutral spine and gently tense your abdominal muscles so that you can maintain this position. Your shoulders and hips should form a rectangle that is parallel with the floor and is not twisted.  Keeping your trunk steady, lift your right hand no higher than your shoulder and then your left leg no higher than your hip. Make sure you are not holding your breath. Hold this position for 30 seconds.  Continuing to keep your abdominal muscles tense and your back steady, slowly return to your starting position. Repeat with the opposite arm and leg. Repeat 2 times. Complete this exercise 3 times per week.   STRENGTHENING - Abdominals and Quadriceps, Straight Leg Raise   Lie on a firm bed or floor with both legs extended in front of you.  Keeping one leg in contact with the floor, bend the other knee so that your foot can rest flat on the floor.  Find your neutral spine, and tense your abdominal muscles to maintain your spinal position throughout the exercise.  Slowly lift your straight leg off the floor about 6 inches for a count of 3, making sure to not hold your breath.  Still keeping your neutral spine, slowly lower your leg all the way to the floor. Repeat this exercise with each leg 2 times. Complete this exercise 3 times per week.  POSTURE AND BODY MECHANICS CONSIDERATIONS - Low Back Sprain Keeping correct posture when sitting, standing or  completing your activities will reduce the stress put on different body tissues, allowing injured tissues a chance to heal and limiting painful experiences. The following are general guidelines for improved posture.  While reading these guidelines, remember:  The exercises prescribed by your provider will help you have the flexibility and strength to maintain correct postures.  The correct posture provides the best environment for  your joints to work. All of your joints have less wear and tear when properly supported by a spine with good posture. This means you will experience a healthier, less painful body.  Correct posture must be practiced with all of your activities, especially prolonged sitting and standing. Correct posture is as important when doing repetitive low-stress activities (typing) as it is when doing a single heavy-load activity (lifting).  RESTING POSITIONS Consider which positions are most painful for you when choosing a resting position. If you have pain with flexion-based activities (sitting, bending, stooping, squatting), choose a position that allows you to rest in a less flexed posture. You would want to avoid curling into a fetal position on your side. If your pain worsens with extension-based activities (prolonged standing, working overhead), avoid resting in an extended position such as sleeping on your stomach. Most people will find more comfort when they rest with their spine in a more neutral position, neither too rounded nor too arched. Lying on a non-sagging bed on your side with a pillow between your knees, or on your back with a pillow under your knees will often provide some relief. Keep in mind, being in any one position for a prolonged period of time, no matter how correct your posture, can still lead to stiffness.  PROPER SITTING POSTURE In order to minimize stress and discomfort on your spine, you must sit with correct posture. Sitting with good posture should be  effortless for a healthy body. Returning to good posture is a gradual process. Many people can work toward this most comfortably by using various supports until they have the flexibility and strength to maintain this posture on their own. When sitting with proper posture, your ears will fall over your shoulders and your shoulders will fall over your hips. You should use the back of the chair to support your upper back. Your lower back will be in a neutral position, just slightly arched. You may place a small pillow or folded towel at the base of your lower back for  support.  When working at a desk, create an environment that supports good, upright posture. Without extra support, muscles tire, which leads to excessive strain on joints and other tissues. Keep these recommendations in mind:  CHAIR:  A chair should be able to slide under your desk when your back makes contact with the back of the chair. This allows you to work closely.  The chair's height should allow your eyes to be level with the upper part of your monitor and your hands to be slightly lower than your elbows.  BODY POSITION  Your feet should make contact with the floor. If this is not possible, use a foot rest.  Keep your ears over your shoulders. This will reduce stress on your neck and low back.  INCORRECT SITTING POSTURES  If you are feeling tired and unable to assume a healthy sitting posture, do not slouch or slump. This puts excessive strain on your back tissues, causing more damage and pain. Healthier options include:  Using more support, like a lumbar pillow.  Switching tasks to something that requires you to be upright or walking.  Talking a brief walk.  Lying down to rest in a neutral-spine position.  PROLONGED STANDING WHILE SLIGHTLY LEANING FORWARD  When completing a task that requires you to lean forward while standing in one place for a long time, place either foot up on a stationary 2-4 inch high object to  help maintain the best  posture. When both feet are on the ground, the lower back tends to lose its slight inward curve. If this curve flattens (or becomes too large), then the back and your other joints will experience too much stress, tire more quickly, and can cause pain.  CORRECT STANDING POSTURES Proper standing posture should be assumed with all daily activities, even if they only take a few moments, like when brushing your teeth. As in sitting, your ears should fall over your shoulders and your shoulders should fall over your hips. You should keep a slight tension in your abdominal muscles to brace your spine. Your tailbone should point down to the ground, not behind your body, resulting in an over-extended swayback posture.   INCORRECT STANDING POSTURES  Common incorrect standing postures include a forward head, locked knees and/or an excessive swayback. WALKING Walk with an upright posture. Your ears, shoulders and hips should all line-up.  PROLONGED ACTIVITY IN A FLEXED POSITION When completing a task that requires you to bend forward at your waist or lean over a low surface, try to find a way to stabilize 3 out of 4 of your limbs. You can place a hand or elbow on your thigh or rest a knee on the surface you are reaching across. This will provide you more stability, so that your muscles do not tire as quickly. By keeping your knees relaxed, or slightly bent, you will also reduce stress across your lower back. CORRECT LIFTING TECHNIQUES  DO :  Assume a wide stance. This will provide you more stability and the opportunity to get as close as possible to the object which you are lifting.  Tense your abdominals to brace your spine. Bend at the knees and hips. Keeping your back locked in a neutral-spine position, lift using your leg muscles. Lift with your legs, keeping your back straight.  Test the weight of unknown objects before attempting to lift them.  Try to keep your elbows locked  down at your sides in order get the best strength from your shoulders when carrying an object.     Always ask for help when lifting heavy or awkward objects. INCORRECT LIFTING TECHNIQUES DO NOT:   Lock your knees when lifting, even if it is a small object.  Bend and twist. Pivot at your feet or move your feet when needing to change directions.  Assume that you can safely pick up even a paperclip without proper posture.

## 2019-07-09 NOTE — Addendum Note (Signed)
Addended by: Sharon Seller B on: 07/09/2019 11:41 AM   Modules accepted: Orders

## 2019-07-09 NOTE — Telephone Encounter (Signed)
Appt scheduled

## 2019-07-10 ENCOUNTER — Other Ambulatory Visit: Payer: Self-pay | Admitting: Family Medicine

## 2019-07-10 ENCOUNTER — Other Ambulatory Visit: Payer: Medicare Other

## 2019-07-10 DIAGNOSIS — R3989 Other symptoms and signs involving the genitourinary system: Secondary | ICD-10-CM

## 2019-07-10 DIAGNOSIS — M545 Low back pain, unspecified: Secondary | ICD-10-CM

## 2019-07-10 NOTE — Progress Notes (Signed)
Urine

## 2019-07-12 LAB — URINE CULTURE
MICRO NUMBER:: 665071
Result:: NO GROWTH
SPECIMEN QUALITY:: ADEQUATE

## 2019-07-13 DIAGNOSIS — R310 Gross hematuria: Secondary | ICD-10-CM | POA: Diagnosis not present

## 2019-07-19 DIAGNOSIS — N200 Calculus of kidney: Secondary | ICD-10-CM | POA: Diagnosis not present

## 2019-07-19 DIAGNOSIS — R310 Gross hematuria: Secondary | ICD-10-CM | POA: Diagnosis not present

## 2019-07-19 DIAGNOSIS — K5730 Diverticulosis of large intestine without perforation or abscess without bleeding: Secondary | ICD-10-CM | POA: Diagnosis not present

## 2019-07-26 ENCOUNTER — Other Ambulatory Visit: Payer: Self-pay

## 2019-07-26 ENCOUNTER — Encounter (INDEPENDENT_AMBULATORY_CARE_PROVIDER_SITE_OTHER): Payer: Self-pay | Admitting: Family Medicine

## 2019-07-26 ENCOUNTER — Telehealth (INDEPENDENT_AMBULATORY_CARE_PROVIDER_SITE_OTHER): Payer: Medicare Other | Admitting: Family Medicine

## 2019-07-26 DIAGNOSIS — E669 Obesity, unspecified: Secondary | ICD-10-CM

## 2019-07-26 DIAGNOSIS — R7303 Prediabetes: Secondary | ICD-10-CM | POA: Diagnosis not present

## 2019-07-26 DIAGNOSIS — E559 Vitamin D deficiency, unspecified: Secondary | ICD-10-CM

## 2019-07-26 DIAGNOSIS — Z6830 Body mass index (BMI) 30.0-30.9, adult: Secondary | ICD-10-CM | POA: Diagnosis not present

## 2019-07-30 NOTE — Progress Notes (Signed)
Office: [phone]  /  Fax: [phone] TeleHealth Visit:  Bryse Blanchette has verbally consented to this TeleHealth visit today. The patient is located at home, the provider is located at the [employer] [facility] office. The participants in this visit include the listed provider, patient, and the patient's wife, [name]. The visit was conducted today via FaceTime.  HPI:   Chief Complaint: OBESITY Ebin is here to discuss his progress with his obesity treatment plan. He is on the Category 3 plan and is following his eating plan approximately 70% of the time. He states he does exercises by doing yardwork and gardening.  We were unable to weigh the patient today for this TeleHealth visit. He feels as if he has gained weight since his last visit. He has lost 30 lbs since starting treatment with [name].  Pre-Diabetes Champion has a diagnosis of prediabetes based on his elevated Hgb A1c and was informed this puts him at greater risk of developing diabetes. His last A1c was 5.8 on 12/05/2018. He is not taking metformin currently and continues to work on diet and weight loss but has gained some weight since COVID-19 isolation.He denies nausea, vomiting, or hypoglycemia.  Vitamin D deficiency Virgle has a diagnosis of Vitamin D deficiency. He is currently stable on OTC Vit D and is due for labs. He denies nausea, vomiting or muscle weakness.  ASSESSMENT AND PLAN:  Prediabetes - Plan: Comprehensive metabolic panel, Hemoglobin A1c, Insulin, random  Vitamin D deficiency - Plan: VITAMIN D 25 Hydroxy (Vit-D Deficiency, Fractures)  Class 1 obesity with serious comorbidity and body mass index (BMI) of 30.0 to 30.9 in adult, unspecified obesity type  PLAN:  Pre-Diabetes Beacher will continue to work on weight loss, exercise, and decreasing simple carbohydrates in his diet to help decrease the risk of diabetes. We dicussed metformin including benefits and risks. He was informed that eating too many  simple carbohydrates or too many calories at one sitting increases the likelihood of GI side effects. Gibson will continue diet and exercise. He will have labs done and follow-up with our clinic as directed to monitor his progress.  Vitamin D Deficiency Keziah was informed that low Vitamin D levels contributes to fatigue and are associated with obesity, breast, and colon cancer. He agrees to continue taking OTC Vit D @ 5,000 IU daily and will have routine testing of Vitamin D. He was informed of the risk of over-replacement of Vitamin D and agrees to not increase his dose unless he discusses this with [name] first. Tighe agrees to follow-up with our clinic in 8 weeks.  Obesity Heith is currently in the action stage of change. As such, his goal is to continue with weight loss efforts. He has agreed to follow the Category 3 plan. Golden has been instructed to work up to a goal of 150 minutes of combined cardio and strengthening exercise per week for weight loss and overall health benefits. We discussed the following Behavioral Modification Strategies today: increasing lean protein intake, decrease eating out, work on meal planning and easy cooking plans.  Daiwik has agreed to follow-up with our clinic in 8 weeks. He was informed of the importance of frequent follow-up visits to maximize his success with intensive lifestyle modifications for his multiple health conditions.  ALLERGIES: No Known Allergies  MEDICATIONS: Current Outpatient Medications on File Prior to Visit  Medication Sig Dispense Refill  . Cholecalciferol (VITAMIN D3) 5000 units CAPS Take 1 capsule by mouth daily.    . diazepam (  VALIUM) 5 MG tablet Take 1 tablet (5 mg total) by mouth every 6 (six) hours as needed for anxiety. 30 tablet 1  . ELIQUIS 5 MG TABS tablet Take 1 tablet by mouth twice daily 180 tablet 1  . gabapentin (NEURONTIN) 300 MG capsule Take 3 capsules (900 mg total) by mouth 3 (three) times daily. 270 capsule 5  .  Multiple Vitamin (MULTIVITAMIN WITH MINERALS) TABS tablet Take 1 tablet by mouth daily.     No current facility-administered medications on file prior to visit.     PAST MEDICAL HISTORY: Past Medical History:  Diagnosis Date  . Anxiety   . Atrial fibrillation ([place])   . BPH (benign prostatic hypertrophy)   . Bradycardia 12/30/2014   HR routinely in mid 40s-50s  . Coronary artery disease (CAD) excluded April 2016   False-positive nuclear stress test suggesting inferior ischemia  . Elevated PSA   . Encounter for Medicare annual wellness exam 09/22/2013   Sees Dr Wilhemina Bonito of Derm Sees Dr Silvano Rusk of Gastroenterology Sees Dr Kathie Rhodes of [facility] Sees Dr Susa Day of Optometry  . Finger pain, left 04/12/2016   4th   . GERD (gastroesophageal reflux disease)    controlled w/ diet and behavioral changes, history of  . Grade I diastolic dysfunction 6834  . History of kidney stones   . Hypertriglyceridemia 03/16/2015  . Lymphadenitis   . Melanoma of back ([place])    Excised Dr Wilhemina Bonito  . Mild ascending aorta dilatation (HCC)   . New onset a-fib ([place]) 03/10/2015  . OA (osteoarthritis)   . Otitis, externa, infective 08/10/2015  . Palpitations   . Post herpetic neuralgia   . Shingles 07/09/2013  . Sleep apnea 07/30/2011   cpap- 13   . Snoring disorder 07/30/2011    PAST SURGICAL HISTORY: Past Surgical History:  Procedure Laterality Date  . CATARACT EXTRACTION Right   . COLONOSCOPY    . EYE SURGERY Left 12/06/2017   cataract by Dr Gillian Scarce  . INGUINAL HERNIA REPAIR Bilateral 06/01/2019   Procedure: LAPAROSCOPIC LEFT AND  RIGHT INGUINAL HERNIA REPAIR WITH MESH;  Surgeon: Michael Boston, MD;  Location: [facility];  Service: General;  Laterality: Bilateral;  . LEFT HEART CATHETERIZATION WITH CORONARY ANGIOGRAM N/A 04/10/2015   Procedure: LEFT HEART CATHETERIZATION WITH  CORONARY ANGIOGRAM;  Surgeon: Leonie Man, MD;  Location: [facility] CATH LAB;  Service:  Cardiovascular;  Angiographicallly NORMAL CORONARY ARTERIES  . MASS EXCISION N/A 05/16/2014   Procedure: EXCISION POSTERIOR NECK MASS, RIGHT CHEST WALL MASS AND RIGHT AXILLARY MASS AXILLARY NODE DISSECTION;  Surgeon: Adin Hector, MD;  Location: [facility];  Service: General;  Laterality: N/A;  . NM MYOVIEW LTD  03/19/2015   FALSE POSITIVE:  INTERMEDIATE RISK. Small sized, moderate intensity inferior ischemic perfusion defect  . TRANSTHORACIC ECHOCARDIOGRAM  03/20/2015   Normal LV size and function. EF 60-65%. G1 DD. Trivial AI. Borderline aortic root dilation (41 mm), Mild LA dilation.    SOCIAL HISTORY: Social History   Tobacco Use  . Smoking status: Former Smoker    Types: Pipe, Cigarettes    Quit date: 02/11/1989    Years since quitting: 30.4  . Smokeless tobacco: Never Used  Substance Use Topics  . Alcohol use: Yes    Alcohol/week: 7.0 standard drinks    Types: 7 Glasses of wine per week    Comment: 1 per day -- brandy or wine  . Drug use: No    FAMILY HISTORY:  Family History  Problem Relation Age of Onset  . Cancer Mother 70       MM, leukemia  . Obesity Mother   . Emphysema Father 17  . Obesity Father   . Alcohol abuse Father   . Cancer Sister 42       brain  . COPD Brother 32  . Heart disease Paternal Grandmother   . COPD Paternal Grandfather   . Diabetes Sister 88  . Obesity Sister   . Down syndrome Brother 35       aspirated  . Alcohol abuse Other   . Colon cancer Neg Hx    ROS: Review of Systems  Gastrointestinal: Negative for nausea and vomiting.  Musculoskeletal:       Negative for muscle weakness.  Endo/Heme/Allergies:       Negative for hypoglycemia.   PHYSICAL EXAM: Pt in no acute distress  RECENT LABS AND TESTS: BMET    Component Value Date/Time   NA 145 (H) 12/05/2018 0841   K 4.7 12/05/2018 0841   CL 105 12/05/2018 0841   CO2 24 12/05/2018 0841   GLUCOSE 83 12/05/2018 0841   GLUCOSE 85 12/07/2016 1531   BUN 18 12/05/2018 0841    CREATININE 1.04 12/05/2018 0841   CREATININE 0.92 04/04/2015 1113   CALCIUM 9.8 12/05/2018 0841   GFRNONAA 70 12/05/2018 0841   GFRAA 81 12/05/2018 0841   Lab Results  Component Value Date   HGBA1C 5.8 (H) 12/05/2018   HGBA1C 5.6 08/29/2018   HGBA1C 5.7 (H) 04/26/2018   HGBA1C 5.8 (H) 12/22/2017   HGBA1C 5.8 (H) 08/18/2017   Lab Results  Component Value Date   INSULIN 6.7 12/05/2018   INSULIN 5.6 08/29/2018   INSULIN 6.4 04/26/2018   INSULIN 12.1 12/22/2017   INSULIN 17.6 08/18/2017   CBC    Component Value Date/Time   WBC 6.7 08/18/2017 1059   WBC 8.4 12/07/2016 1531   RBC 4.88 08/18/2017 1059   RBC 4.80 12/07/2016 1531   HGB 15.5 08/18/2017 1059   HCT 46.8 08/18/2017 1059   PLT 185.0 12/07/2016 1531   MCV 96 08/18/2017 1059   MCH 31.8 08/18/2017 1059   MCH 28.8 04/04/2015 1113   MCHC 33.1 08/18/2017 1059   MCHC 34.3 12/07/2016 1531   RDW 14.3 08/18/2017 1059   LYMPHSABS 1.7 08/18/2017 1059   MONOABS 0.7 09/13/2013 1350   EOSABS 0.1 08/18/2017 1059   BASOSABS 0.0 08/18/2017 1059   Iron/TIBC/Ferritin/ %Sat No results found for: IRON, TIBC, FERRITIN, IRONPCTSAT Lipid Panel     Component Value Date/Time   CHOL 131 04/26/2018 0902   TRIG 56 04/26/2018 0902   HDL 50 04/26/2018 0902   CHOLHDL 3 04/12/2016 1004   VLDL 21.0 04/12/2016 1004   LDLCALC 70 04/26/2018 0902   Hepatic Function Panel     Component Value Date/Time   PROT 6.5 12/05/2018 0841   ALBUMIN 4.2 12/05/2018 0841   AST 17 12/05/2018 0841   ALT 15 12/05/2018 0841   ALKPHOS 57 12/05/2018 0841   BILITOT 0.9 12/05/2018 0841   BILIDIR 0.1 02/11/2014 1016   IBILI 0.3 02/11/2014 1016      Component Value Date/Time   TSH 2.230 12/05/2018 0841   TSH 1.680 08/18/2017 1059   TSH 1.80 12/07/2016 1531   Results for Charlena Cross ALDEN "MIKE" (MRN [id number]) as of 07/30/2019 11:27  Ref. Range 12/05/2018 08:41  Vitamin D, 25-Hydroxy Latest Ref Range: 30.0 - 100.0 ng/mL 58.0   I, Michaelene Song, am  acting as transcriptionist for Dennard Nip, MD  I have reviewed the above documentation for accuracy and completeness, and I agree with the above. -Dennard Nip, MD

## 2019-08-21 DIAGNOSIS — N202 Calculus of kidney with calculus of ureter: Secondary | ICD-10-CM | POA: Diagnosis not present

## 2019-08-21 DIAGNOSIS — N281 Cyst of kidney, acquired: Secondary | ICD-10-CM | POA: Diagnosis not present

## 2019-08-23 DIAGNOSIS — G4733 Obstructive sleep apnea (adult) (pediatric): Secondary | ICD-10-CM | POA: Diagnosis not present

## 2019-09-10 DIAGNOSIS — E559 Vitamin D deficiency, unspecified: Secondary | ICD-10-CM | POA: Diagnosis not present

## 2019-09-10 DIAGNOSIS — R7303 Prediabetes: Secondary | ICD-10-CM | POA: Diagnosis not present

## 2019-09-11 LAB — COMPREHENSIVE METABOLIC PANEL
ALT: 18 IU/L (ref 0–44)
AST: 20 IU/L (ref 0–40)
Albumin/Globulin Ratio: 2 (ref 1.2–2.2)
Albumin: 4.2 g/dL (ref 3.7–4.7)
Alkaline Phosphatase: 58 IU/L (ref 39–117)
BUN/Creatinine Ratio: 26 — ABNORMAL HIGH (ref 10–24)
BUN: 27 mg/dL (ref 8–27)
Bilirubin Total: 0.8 mg/dL (ref 0.0–1.2)
CO2: 22 mmol/L (ref 20–29)
Calcium: 9.5 mg/dL (ref 8.6–10.2)
Chloride: 106 mmol/L (ref 96–106)
Creatinine, Ser: 1.04 mg/dL (ref 0.76–1.27)
GFR calc Af Amer: 80 mL/min/{1.73_m2} (ref >59)
GFR calc non Af Amer: 69 mL/min/{1.73_m2} (ref >59)
Globulin, Total: 2.1 g/dL (ref 1.5–4.5)
Glucose: 77 mg/dL (ref 65–99)
Potassium: 4.4 mmol/L (ref 3.5–5.2)
Sodium: 142 mmol/L (ref 134–144)
Total Protein: 6.3 g/dL (ref 6.0–8.5)

## 2019-09-11 LAB — INSULIN, RANDOM: INSULIN: 27.4 u[IU]/mL — ABNORMAL HIGH (ref 2.6–24.9)

## 2019-09-11 LAB — VITAMIN D 25 HYDROXY (VIT D DEFICIENCY, FRACTURES): Vit D, 25-Hydroxy: 58.2 ng/mL (ref 30.0–100.0)

## 2019-09-11 LAB — HEMOGLOBIN A1C
Est. average glucose Bld gHb Est-mCnc: 111 mg/dL
Hgb A1c MFr Bld: 5.5 % (ref 4.8–5.6)

## 2019-09-18 ENCOUNTER — Ambulatory Visit: Payer: Medicare Other | Admitting: Family Medicine

## 2019-09-18 DIAGNOSIS — N201 Calculus of ureter: Secondary | ICD-10-CM | POA: Diagnosis not present

## 2019-09-19 ENCOUNTER — Encounter (INDEPENDENT_AMBULATORY_CARE_PROVIDER_SITE_OTHER): Payer: Self-pay

## 2019-09-19 ENCOUNTER — Telehealth (INDEPENDENT_AMBULATORY_CARE_PROVIDER_SITE_OTHER): Payer: Medicare Other | Admitting: Family Medicine

## 2019-09-19 ENCOUNTER — Encounter (INDEPENDENT_AMBULATORY_CARE_PROVIDER_SITE_OTHER): Payer: Self-pay | Admitting: Family Medicine

## 2019-09-19 ENCOUNTER — Other Ambulatory Visit: Payer: Self-pay

## 2019-09-19 DIAGNOSIS — R7303 Prediabetes: Secondary | ICD-10-CM

## 2019-09-19 DIAGNOSIS — N200 Calculus of kidney: Secondary | ICD-10-CM

## 2019-09-19 DIAGNOSIS — Z6830 Body mass index (BMI) 30.0-30.9, adult: Secondary | ICD-10-CM

## 2019-09-19 DIAGNOSIS — E669 Obesity, unspecified: Secondary | ICD-10-CM | POA: Diagnosis not present

## 2019-09-20 ENCOUNTER — Telehealth: Payer: Self-pay | Admitting: Cardiology

## 2019-09-20 NOTE — Progress Notes (Signed)
Office: [phone]  /  Fax: [phone] TeleHealth Visit:  [place] has verbally consented to this TeleHealth visit today. The patient is located at home, the provider is located at the [employer] [facility] office. The participants in this visit include the listed provider, patient, and the patient's spouse, [name]. The visit was conducted today via FaceTime.  HPI:   Chief Complaint: OBESITY Moua is here to discuss his progress with his obesity treatment plan. He is on the Category 3 plan and is following his eating plan approximately 75-80% of the time. He states he is stays busy in the workshop and yard. Neilan continues to do well with maintaining his weight loss. He is very active in his workshop and staying very busy. He notes hunger is controlled and he mostly is able to avoid temptations. We were unable to weigh the patient today for this TeleHealth visit. He feels as if he has maintained his weight since his last visit. He has lost 30 lbs since starting treatment with [name].  Pre-Diabetes Loxley has a diagnosis of prediabetes based on his elevated Hgb A1c and was informed this puts him at greater risk of developing diabetes. His A1c has improved with diet, but his fasting insulin has increased. Polyphagia is improved. He is not taking metformin currently and continues to work on diet and exercise to decrease risk of diabetes. He denies hypoglycemia.  Nephrolithiasis Agustin was recently diagnosed with a stone and is scheduled to have it removed soon. He is not currently in pain. He states he tends to get dehydrated easily.  ASSESSMENT AND PLAN:  Prediabetes  Nephrolithiasis  Class 1 obesity with serious comorbidity and body mass index (BMI) of 30.0 to 30.9 in adult, unspecified obesity type  PLAN:  Pre-Diabetes Odyn will continue to work on weight loss, exercise, and decreasing simple carbohydrates in his diet to help decrease the risk of diabetes. We dicussed  metformin including benefits and risks. He was informed that eating too many simple carbohydrates or too many calories at one sitting increases the likelihood of GI side effects. Kyheem will continue diet and exercise and follow-up with [name] as directed to monitor his progress. Will recheck labs in 6 months.  Nephrolithiasis Keondre will work on increasing his water intake and continue to follow his urologist's advice.  I spent > than 50% of the 25 minute visit on counseling as documented in the note.  Obesity Jowan is currently in the action stage of change. As such, his goal is to continue with weight loss efforts. He has agreed to follow the Category 3 plan. Recipes were sent today. Jonovan has been instructed to continue his current exercise regimen for weight loss and overall health benefits. We discussed the following Behavioral Modification Strategies today: increase water intake, work on meal planning and easy cooking plans.  Hadden has agreed to follow-up with our clinic in 2 weeks. He was informed of the importance of frequent follow-up visits to maximize his success with intensive lifestyle modifications for his multiple health conditions.  ALLERGIES: No Known Allergies  MEDICATIONS: Current Outpatient Medications on File Prior to Visit  Medication Sig Dispense Refill   Cholecalciferol (VITAMIN D3) 5000 units CAPS Take 1 capsule by mouth daily.     diazepam (VALIUM) 5 MG tablet Take 1 tablet (5 mg total) by mouth every 6 (six) hours as needed for anxiety. 30 tablet 1   ELIQUIS 5 MG TABS tablet Take 1 tablet by mouth twice daily 180 tablet  1   gabapentin (NEURONTIN) 300 MG capsule Take 3 capsules (900 mg total) by mouth 3 (three) times daily. 270 capsule 5   Multiple Vitamin (MULTIVITAMIN WITH MINERALS) TABS tablet Take 1 tablet by mouth daily.     No current facility-administered medications on file prior to visit.     PAST MEDICAL HISTORY: Past Medical History:  Diagnosis  Date   Anxiety    Atrial fibrillation (HCC)    BPH (benign prostatic hypertrophy)    Bradycardia 12/30/2014   HR routinely in mid 40s-50s   Coronary artery disease (CAD) excluded April 2016   False-positive nuclear stress test suggesting inferior ischemia   Elevated PSA    Encounter for Medicare annual wellness exam 09/22/2013   Sees Dr Wilhemina Bonito of Derm Sees Dr Silvano Rusk of Gastroenterology Sees Dr Kathie Rhodes of [facility] Sees Dr Susa Day of Optometry   Finger pain, left 04/12/2016   4th    GERD (gastroesophageal reflux disease)    controlled w/ diet and behavioral changes, history of   Grade I diastolic dysfunction [id number]   History of kidney stones    Hypertriglyceridemia 03/16/2015   Lymphadenitis    Melanoma of back ([place])    Excised Dr Wilhemina Bonito   Mild ascending aorta dilatation ([place])    New onset a-fib ([place]) 03/10/2015   OA (osteoarthritis)    Otitis, externa, infective 08/10/2015   Palpitations    Post herpetic neuralgia    Shingles 07/09/2013   Sleep apnea 07/30/2011   cpap- 13    Snoring disorder 07/30/2011    PAST SURGICAL HISTORY: Past Surgical History:  Procedure Laterality Date   CATARACT EXTRACTION Right    COLONOSCOPY     EYE SURGERY Left 12/06/2017   cataract by Dr Logan Bores HERNIA REPAIR Bilateral 06/01/2019   Procedure: LAPAROSCOPIC LEFT AND  RIGHT INGUINAL HERNIA REPAIR WITH MESH;  Surgeon: Michael Boston, MD;  Location: [facility];  Service: General;  Laterality: Bilateral;   [place] N/A 04/10/2015   Procedure: LEFT HEART CATHETERIZATION WITH  CORONARY ANGIOGRAM;  Surgeon: Leonie Man, MD;  Location: [facility] CATH LAB;  Service: Cardiovascular;  Angiographicallly NORMAL CORONARY ARTERIES   MASS EXCISION N/A 05/16/2014   Procedure: EXCISION POSTERIOR NECK MASS, RIGHT CHEST WALL MASS AND RIGHT AXILLARY MASS AXILLARY NODE DISSECTION;  Surgeon: Adin Hector, MD;   Location: [facility];  Service: General;  Laterality: N/A;   NM MYOVIEW LTD  03/19/2015   FALSE POSITIVE:  INTERMEDIATE RISK. Small sized, moderate intensity inferior ischemic perfusion defect   TRANSTHORACIC ECHOCARDIOGRAM  03/20/2015   Normal LV size and function. EF 60-65%. G1 DD. Trivial AI. Borderline aortic root dilation (41 mm), Mild LA dilation.    SOCIAL HISTORY: Social History   Tobacco Use   Smoking status: Former Smoker    Types: Pipe, Cigarettes    Quit date: 02/11/1989    Years since quitting: 30.6   Smokeless tobacco: Never Used  Substance Use Topics   Alcohol use: Yes    Alcohol/week: 7.0 standard drinks    Types: 7 Glasses of wine per week    Comment: 1 per day -- brandy or wine   Drug use: No    FAMILY HISTORY: Family History  Problem Relation Age of Onset   Cancer Mother 32       MM, leukemia   Obesity Mother    Emphysema Father 36   Obesity Father    Alcohol  abuse Father    Cancer Sister 46       brain   COPD Brother 28   Heart disease Paternal Grandmother    COPD Paternal Grandfather    Diabetes Sister 66   Obesity Sister    Down syndrome Brother 64       aspirated   Alcohol abuse Other    Colon cancer Neg Hx    ROS: Review of Systems  Genitourinary:       Positive for nephrolithiasis.  Endo/Heme/Allergies:       Negative for hypoglycemia.   PHYSICAL EXAM: Pt in no acute distress  RECENT LABS AND TESTS: BMET    Component Value Date/Time   NA 142 09/10/2019 1000   K 4.4 09/10/2019 1000   CL 106 09/10/2019 1000   CO2 22 09/10/2019 1000   GLUCOSE 77 09/10/2019 1000   GLUCOSE 85 12/07/2016 1531   BUN 27 09/10/2019 1000   CREATININE 1.04 09/10/2019 1000   CREATININE 0.92 04/04/2015 1113   CALCIUM 9.5 09/10/2019 1000   GFRNONAA 69 09/10/2019 1000   GFRAA 80 09/10/2019 1000   Lab Results  Component Value Date   HGBA1C 5.5 09/10/2019   HGBA1C 5.8 (H) 12/05/2018   HGBA1C 5.6 08/29/2018   HGBA1C 5.7 (H) 04/26/2018    HGBA1C 5.8 (H) 12/22/2017   Lab Results  Component Value Date   INSULIN 27.4 (H) 09/10/2019   INSULIN 6.7 12/05/2018   INSULIN 5.6 08/29/2018   INSULIN 6.4 04/26/2018   INSULIN 12.1 12/22/2017   CBC    Component Value Date/Time   WBC 6.7 08/18/2017 1059   WBC 8.4 12/07/2016 1531   RBC 4.88 08/18/2017 1059   RBC 4.80 12/07/2016 1531   HGB 15.5 08/18/2017 1059   HCT 46.8 08/18/2017 1059   PLT 185.0 12/07/2016 1531   MCV 96 08/18/2017 1059   MCH 31.8 08/18/2017 1059   MCH 28.8 04/04/2015 1113   MCHC 33.1 08/18/2017 1059   MCHC 34.3 12/07/2016 1531   RDW 14.3 08/18/2017 1059   LYMPHSABS 1.7 08/18/2017 1059   MONOABS 0.7 09/13/2013 1350   EOSABS 0.1 08/18/2017 1059   BASOSABS 0.0 08/18/2017 1059   Iron/TIBC/Ferritin/ %Sat No results found for: IRON, TIBC, FERRITIN, IRONPCTSAT Lipid Panel     Component Value Date/Time   CHOL 131 04/26/2018 0902   TRIG 56 04/26/2018 0902   HDL 50 04/26/2018 0902   CHOLHDL 3 04/12/2016 1004   VLDL 21.0 04/12/2016 1004   LDLCALC 70 04/26/2018 0902   Hepatic Function Panel     Component Value Date/Time   PROT 6.3 09/10/2019 1000   ALBUMIN 4.2 09/10/2019 1000   AST 20 09/10/2019 1000   ALT 18 09/10/2019 1000   ALKPHOS 58 09/10/2019 1000   BILITOT 0.8 09/10/2019 1000   BILIDIR 0.1 02/11/2014 1016   IBILI 0.3 02/11/2014 1016      Component Value Date/Time   TSH 2.230 12/05/2018 0841   TSH 1.680 08/18/2017 1059   TSH 1.80 12/07/2016 1531   Results for RYLAND, MICHALSKI ALDEN "MIKE" (MRN [id number]) as of 09/20/2019 12:46  Ref. Range 09/10/2019 10:00  Vitamin D, 25-Hydroxy Latest Ref Range: 30.0 - 100.0 ng/mL 58.2   I, Michaelene Song, am acting as Location manager for Dennard Nip, MD I have reviewed the above documentation for accuracy and completeness, and I agree with the above. -Dennard Nip, MD

## 2019-09-20 NOTE — Telephone Encounter (Signed)
° °  [facility] Pre-operative Risk Assessment    Request for surgical clearance:  1. What type of surgery is being performed?  Cystoscopy and Ureteroscopy Laser Lithotripsy with possible Stent Placement   2. When is this surgery scheduled?  Pending   3. What type of clearance is required (medical clearance vs. Pharmacy clearance to hold med vs. Both)? Both   4. Are there any medications that need to be held prior to surgery and how long? Can pt hold his Eliquis 2 days prior to surgery?  5. Practice name and name of physician performing surgery?  Dr Kathie Rhodes   6. What is your office phone number  [phone]    7.   What is your office fax number [phone]  8.   Anesthesia type (None, local, MAC, general) ?  General   [name] [name] 09/20/2019, 11:01 AM  _________________________________________________________________   (provider comments below)

## 2019-09-20 NOTE — Telephone Encounter (Signed)
Please give recommendations for holding eliquis.

## 2019-09-21 NOTE — Telephone Encounter (Signed)
Called the requesting office and spoke with Marlowe Kays. She stated that the patient has a stone and would consider the matter urgent.

## 2019-09-21 NOTE — Telephone Encounter (Signed)
Patient has upcoming followup with Dr. Nile Dear on 10/14 for 12 month, will need to readdress clearance at that time. Last seen in Oct 2019. Please change the title of Dr. Allison Quarry visit to include preoperative clearance.  Callback to verify with urology to see if the surgery is urgent and need to be done prior to 10/17

## 2019-09-21 NOTE — Telephone Encounter (Signed)
Patient with diagnosis of afib on Eliquis for anticoagulation.    Procedure: Cystoscopy and Ureteroscopy Laser Lithotripsy with possible Stent Placement  Date of procedure: TBD  CHADS2-VASc score of  3 (AGE, AGE)  CrCl 80ml/min  Per office protocol, patient can hold Eliquis for 2 days prior to procedure.

## 2019-09-21 NOTE — Telephone Encounter (Signed)
   Primary Cardiologist: Glenetta Hew, MD  Chart reviewed as part of pre-operative protocol coverage. Patient was contacted 09/21/2019 in reference to pre-operative risk assessment for pending surgery as outlined below.  [name] [name] was last seen on 10/10/2018 by Dr. Ellyn Hack.  Since that day, [name] [name] has done well without chest pain or shortness of breath.   Therefore, based on ACC/AHA guidelines, the patient would be at acceptable risk for the planned procedure without further cardiovascular testing.   I will route this recommendation to the requesting party via Epic fax function and remove from pre-op pool.  Please call with questions. Per our clinical pharmacist, ok to hold Eliquis for 2 days prior to the procedure and restart as soon as possible afterward at the surgeon's discretion based on bleeding risk  Almyra Deforest, PA 09/21/2019, 4:13 PM

## 2019-09-25 ENCOUNTER — Other Ambulatory Visit: Payer: Self-pay | Admitting: Urology

## 2019-10-02 ENCOUNTER — Other Ambulatory Visit (HOSPITAL_COMMUNITY)
Admission: RE | Admit: 2019-10-02 | Discharge: 2019-10-02 | Disposition: A | Discharge status: Home / Self Care | Payer: Medicare Other | Source: Ambulatory Visit | Attending: Urology | Admitting: Urology

## 2019-10-02 ENCOUNTER — Encounter (HOSPITAL_BASED_OUTPATIENT_CLINIC_OR_DEPARTMENT_OTHER): Payer: Self-pay | Admitting: *Deleted

## 2019-10-02 ENCOUNTER — Other Ambulatory Visit: Payer: Self-pay

## 2019-10-02 DIAGNOSIS — Z01812 Encounter for preprocedural laboratory examination: Secondary | ICD-10-CM | POA: Insufficient documentation

## 2019-10-02 DIAGNOSIS — Z20828 Contact with and (suspected) exposure to other viral communicable diseases: Secondary | ICD-10-CM | POA: Insufficient documentation

## 2019-10-02 NOTE — Progress Notes (Signed)
Spoke w/ via phone for pre-op interview---[name] [name] needs dos---- I stat 8, kub             [name] results------cardiac clearance hao meng 09-21-19 epic/chart, lov dr harding cardiology 10-10-2018 chart/epic, echo 03-20-15 epic, ekg 10-10-18 epic/chart COVID test ------10-02-2019 Arrive at -------730 NPO after ------midnight Medications to takemidnight morning of surgery -----gabapentin Diabetic medication ----n/a Patient Special Instructions -----stop eliquis x 2 days before surgery per hao meng pa  Pre-Op special Istructions -----bring cpap mask atuibng and machine leave in car Patient verbalized understanding of instructions that were given at this phone interview. Patient denies shortness of breath, chest pain, fever, cough a this phone interview.

## 2019-10-03 LAB — NOVEL CORONAVIRUS, NAA (HOSP ORDER, SEND-OUT TO REF LAB; TAT 18-24 HRS): SARS-CoV-2, NAA: NOT DETECTED

## 2019-10-04 ENCOUNTER — Other Ambulatory Visit: Payer: Self-pay | Admitting: Urology

## 2019-10-04 NOTE — H&P (Signed)
HPI: [name] [name] is a 79 year-old male with a left ureteral calculus.  The patient's stone was on his left side. He first noticed the symptoms 06/29/2019. This is not his first kidney stone. He is not currently having flank pain, back pain, groin pain, nausea, vomiting, fever or chills. He has not caught a stone in his urine strainer since his symptoms began.   This condition would be considered of mild to moderate severity with no modifying factors or associated signs or symptoms other than as noted above.   08/21/19: He returns today for follow-up of gross hematuria. He underwent a CT scan on 07/19/19 which revealed punctate stones in the right kidney, a 1 cm stone in the upper pole of the left kidney and what appeared to be a 4 mm stone located in the ureter on the left-hand side near the UPJ but it did not appear to be obstructing. It had Hounsfield units of approximately 500.  He reports he has not had any flank pain, hematuria or urinary symptoms and when he passed a stone previously about 6 months ago he said it was not associated with any pain whatsoever. He only new it past because he was using a urine strainer.   09/18/19: He continues to remain completely asymptomatic. He has not had any hematuria. He has not seen his stone pass.     ALLERGIES: No Allergies    MEDICATIONS: Tamsulosin Hcl 0.4 mg capsule 1 capsule PO Q PM  Acyclovir 400 MG Oral Tablet 0 Oral  Adult Aspirin Low Strength 81 MG TBDP Oral  Duloxetine Hcl 60 mg capsule,delayed release Oral  Eliquis  Gabapentin 300 mg capsule Oral  HydrOXYzine HCl - 25 MG Oral Tablet 0 Oral  Methocarbamol 500 mg tablet Oral  Multiple Vitamin TABS Oral  Omega 3 1200 MG Oral Capsule Oral     GU PSH: Locm 300-399Mg /Ml Iodine,1Ml - 07/19/2019 Prostate Needle Biopsy - 2010 Vasectomy - 2010       PSH Notes: Surgery Of Male Genitalia Vasectomy, Biopsy Of The Prostate Needle   NON-GU PSH: None   GU PMH: Elevated PSA (Stable), His  prostate is smooth and benign. His PSA remains stable with a good free to total ratio so I will continue to monitor this on a yearly basis. - 08/21/2019, (Stable), Noted his prostate was benign on his exam and his PSA remains stable with an excellent free to total ratio. I will plan to see him back again on a yearly basis, - 08/14/2018 (Stable), He has a history of an elevated PSA. I will recheck that when he returns in 6 months., - 2017, Elevated prostate specific antigen (PSA), - 2017 Renal calculus, Bilateral, He has bilateral renal calculi that are nonobstructing. The stones on the right-hand side a punctate. - 08/21/2019 Ureteral calculus, Left, I cannot see his left ureteral stone on his KUB today but he has not seen a stone pass. I am going to empirically begin an alpha-blocker and then have him return in 4 weeks for repeat KUB. - 08/21/2019 Gross hematuria - 07/13/2019 Lower abdominal pain, unspecified (Acute), Left, He had no hernia that I could feel today on exam. That being said his history is pretty suggestive of a hernia. I told him that I felt it would be a good idea to obtain an opinion from a Education officer, environmental. I do not feel that his pain is primarily from his spermatic cord. - 11/30/2018 Post-void dribbling (Stable), He has some mild postvoid dribbling. We  discussed how to strip the urethra to help with this. - 08/14/2018 History of urolithiasis (Stable), He has a history of stones but has no evidence of recurrent calculus disease at this time. I will continue imaging on a p.r.n. basis from here out. 2017-04-15, Left, He has a left renal calculus and I am going to reimage this again in 6 months with a KUB., 15-Apr-2016 Renal and ureteral calculus (Stable, Acute), Left, he has a small stone in the left ureter which has a good chance of spontaneous passage. I have placed him on medical expulsive therapy 04-15-2016 Renal cyst (Stable), Right, his cyst appears benign. 04/15/16 BPH w/LUTS, Benign prostatic hyperplasia  with urinary obstruction - 15-Apr-2016 Nocturia, Nocturia - 2015-04-16      PMH Notes: Calculus disease: On 06/19/16 a CT scan obtained and was told he had a 5 mm left ureteral stone. He passed the stone.  Stone analysis: Calcium oxalate 95% and calcium phosphate 5%   Right renal cyst: A simple cyst was noted in the right kidney by CT scan in 6/17.   BPH with outlet obstruction: He was under the care of urologist in [place] for a number of years before moving to [place]. He underwent several uroflow studies and was noted to have a maximum flow of 9 mL/second with a mean flow of 4 mL/second and 312 cc voided at the time of the study done in 11/09. He had been on Uroxatral for that but found he urinated better off of the medication.   Elevated PSA: His PSA was noted to be 3.1 in 11/04 and he underwent a TRUS/Bx that revealed benign pathology. Since then his PSA has fluctuated over time and in 11/06 was 4.0 but then fell to 3.2 the following year. in 7/10 it was noted to be as high as 7.47 again returning to just above for on follow-up. This pattern has repeated several times indicating prostate cancer as an unlikely cause.     NON-GU PMH: Encounter for general adult medical examination without abnormal findings, Encounter for preventive health examination - 04/16/15 Personal history of other infectious and parasitic diseases, History of shingles - 2014-04-15    FAMILY HISTORY: Death In The Family Father - Willis In Family Death In The Family Mother - Runs In Family Emphysema - Runs In Family Family Health Status Number - Runs In Family leukemia - Runs In Family   SOCIAL HISTORY: Marital Status: Married Preferred Language: English; Ethnicity: Not Hispanic Or Latino; Race: White Current Smoking Status: Patient does not smoke anymore.  Does not use smokeless tobacco. Social Drinker.  Does not use drugs. Drinks 2 caffeinated drinks per day. Has not had a blood transfusion.     Notes: Former smoker, Alcohol  Use, Caffeine Use, Marital History - Currently Married, Tobacco Use, Occupation:   REVIEW OF SYSTEMS:    GU Review Male:   Patient denies frequent urination, hard to postpone urination, burning/ pain with urination, get up at night to urinate, leakage of urine, stream starts and stops, trouble starting your stream, have to strain to urinate , erection problems, and penile pain.  Gastrointestinal (Upper):   Patient denies nausea, vomiting, and indigestion/ heartburn.  Gastrointestinal (Lower):   Patient denies diarrhea and constipation.  Constitutional:   Patient denies fever, night sweats, weight loss, and fatigue.  Skin:   Patient denies skin rash/ lesion and itching.  Eyes:   Patient denies double vision and blurred vision.  Ears/ Nose/ Throat:  Patient denies sore throat and sinus problems.  Hematologic/Lymphatic:   Patient denies swollen glands and easy bruising.  Cardiovascular:   Patient denies leg swelling and chest pains.  Respiratory:   Patient denies cough and shortness of breath.  Endocrine:   Patient denies excessive thirst.  Musculoskeletal:   Patient denies back pain and joint pain.  Neurological:   Patient denies headaches and dizziness.  Psychologic:   Patient denies depression and anxiety.   VITAL SIGNS:    Weight 206 lb / 93.44 kg  Height 70 in / 177.8 cm  BP 126/85 mmHg  Pulse 59 /min  BMI 29.6 kg/m   GU PHYSICAL EXAMINATION:    Anus and Perineum: No hemorrhoids. No anal stenosis. No rectal fissure, no anal fissure. No edema, no dimple, no perineal tenderness, no anal tenderness.  Urethral Meatus: Normal size. No lesion, no wart, no discharge, no polyp. Normal location.  Penis: Circumcised, no warts, no cracks. No dorsal Peyronie's plaques, no left corporal Peyronie's plaques, no right corporal Peyronie's plaques, no scarring, no warts. No balanitis, no meatal stenosis.  Prostate: Prostate 2 1/2+ size. Left lobe normal consistency, right lobe normal consistency.  Symmetrical lobes. No prostate nodule. Left lobe no tenderness, right lobe no tenderness.   Seminal Vesicles: Nonpalpable.  Sphincter Tone: Normal sphincter. No rectal tenderness. No rectal mass.    MULTI-SYSTEM PHYSICAL EXAMINATION:    Constitutional: Well-nourished. No physical deformities. Normally developed. Good grooming.  Neck: Neck symmetrical, not swollen. Normal tracheal position.  Respiratory: No labored breathing, no use of accessory muscles.   Neurologic / Psychiatric: Oriented to time, oriented to place, oriented to person. No depression, no anxiety, no agitation.  Gastrointestinal: Obese abdomen. No mass, no tenderness, no rigidity. No CVA tenderness, point tenderness, lower abdominal tenderness.  Musculoskeletal: Normal gait and station of head and neck.     PAST DATA REVIEWED:  Source Of History:  Patient  Records Review:   Previous Patient Records, POC Tool  X-Ray Review: KUB: Reviewed Films. Previous KUB images were reviewed and compared with today's study. C.T. Abdomen/Pelvis: Reviewed Films. His previous CT scan was reviewed which revealed the stone and was compared with today's KUB.    08/15/19 08/07/18 07/28/17 04/15/16 04/19/15 04/17/14 04/05/13 03/27/12  PSA  Total PSA 4.11 ng/mL 4.13 ng/mL 3.80 ng/mL 4.06  4.72  4.55  4.67  4.27   Free PSA 0.96 ng/mL 1.13 ng/mL  0.92  0.94  1.00  0.84  0.95   % Free PSA 23 % PSA 27 % PSA  23  20  22  18  22      PROCEDURES:         KUB - [id number]  A single view of the abdomen is obtained.      Patient confirmed No Neulasta OnPro Device. Independent review of his KUB today reveals the stone is visible located between L3 and L4 vertebral bodies on the left-hand side.         Urinalysis Dipstick Dipstick Cont'd  Color: Yellow Bilirubin: Neg mg/dL  Appearance: Clear Ketones: Neg mg/dL  Specific Gravity: 1.015 Blood: Neg ery/uL  pH: 5.5 Protein: Neg mg/dL  Glucose: Neg mg/dL Urobilinogen: 0.2 mg/dL    Nitrites: Neg     Leukocyte Esterase: Neg leu/uL    ASSESSMENT/PLAN:      ICD-10 Details  1 GU:   Ureteral calculus - N20.1 Left, Stable - His stone is fairly faint. We therefore discussed treating it with ureteroscopy.

## 2019-10-05 ENCOUNTER — Ambulatory Visit (HOSPITAL_COMMUNITY): Payer: Medicare Other

## 2019-10-05 ENCOUNTER — Ambulatory Visit (HOSPITAL_BASED_OUTPATIENT_CLINIC_OR_DEPARTMENT_OTHER): Payer: Medicare Other | Admitting: Anesthesiology

## 2019-10-05 ENCOUNTER — Ambulatory Visit (HOSPITAL_BASED_OUTPATIENT_CLINIC_OR_DEPARTMENT_OTHER)
Admission: RE | Admit: 2019-10-05 | Discharge: 2019-10-05 | Disposition: A | Discharge status: Home / Self Care | Payer: Medicare Other | Attending: Urology | Admitting: Urology

## 2019-10-05 ENCOUNTER — Encounter (HOSPITAL_BASED_OUTPATIENT_CLINIC_OR_DEPARTMENT_OTHER)
Admission: RE | Disposition: A | Discharge status: Home / Self Care | Payer: Self-pay | Source: Home / Self Care | Attending: Urology

## 2019-10-05 ENCOUNTER — Other Ambulatory Visit: Payer: Self-pay

## 2019-10-05 DIAGNOSIS — N201 Calculus of ureter: Secondary | ICD-10-CM | POA: Diagnosis not present

## 2019-10-05 DIAGNOSIS — N368 Other specified disorders of urethra: Secondary | ICD-10-CM | POA: Diagnosis not present

## 2019-10-05 DIAGNOSIS — Z825 Family history of asthma and other chronic lower respiratory diseases: Secondary | ICD-10-CM | POA: Diagnosis not present

## 2019-10-05 DIAGNOSIS — I4891 Unspecified atrial fibrillation: Secondary | ICD-10-CM | POA: Insufficient documentation

## 2019-10-05 DIAGNOSIS — K219 Gastro-esophageal reflux disease without esophagitis: Secondary | ICD-10-CM | POA: Insufficient documentation

## 2019-10-05 DIAGNOSIS — Z806 Family history of leukemia: Secondary | ICD-10-CM | POA: Insufficient documentation

## 2019-10-05 DIAGNOSIS — G4730 Sleep apnea, unspecified: Secondary | ICD-10-CM | POA: Insufficient documentation

## 2019-10-05 DIAGNOSIS — Z87891 Personal history of nicotine dependence: Secondary | ICD-10-CM | POA: Diagnosis not present

## 2019-10-05 DIAGNOSIS — I351 Nonrheumatic aortic (valve) insufficiency: Secondary | ICD-10-CM | POA: Diagnosis not present

## 2019-10-05 DIAGNOSIS — Z79899 Other long term (current) drug therapy: Secondary | ICD-10-CM | POA: Insufficient documentation

## 2019-10-05 DIAGNOSIS — Z7982 Long term (current) use of aspirin: Secondary | ICD-10-CM | POA: Diagnosis not present

## 2019-10-05 DIAGNOSIS — N362 Urethral caruncle: Secondary | ICD-10-CM | POA: Diagnosis not present

## 2019-10-05 DIAGNOSIS — N200 Calculus of kidney: Secondary | ICD-10-CM | POA: Diagnosis not present

## 2019-10-05 DIAGNOSIS — I2510 Atherosclerotic heart disease of native coronary artery without angina pectoris: Secondary | ICD-10-CM | POA: Diagnosis not present

## 2019-10-05 DIAGNOSIS — E785 Hyperlipidemia, unspecified: Secondary | ICD-10-CM | POA: Insufficient documentation

## 2019-10-05 DIAGNOSIS — M1990 Unspecified osteoarthritis, unspecified site: Secondary | ICD-10-CM | POA: Diagnosis not present

## 2019-10-05 DIAGNOSIS — I4819 Other persistent atrial fibrillation: Secondary | ICD-10-CM | POA: Diagnosis not present

## 2019-10-05 DIAGNOSIS — Z7901 Long term (current) use of anticoagulants: Secondary | ICD-10-CM | POA: Insufficient documentation

## 2019-10-05 HISTORY — PX: CYSTOSCOPY/URETEROSCOPY/HOLMIUM LASER/STENT PLACEMENT: SHX6546

## 2019-10-05 LAB — POCT I-STAT, CHEM 8
BUN: 22 mg/dL (ref 8–23)
Calcium, Ion: 1.31 mmol/L (ref 1.15–1.40)
Chloride: 105 mmol/L (ref 98–111)
Creatinine, Ser: 0.8 mg/dL (ref 0.61–1.24)
Glucose, Bld: 88 mg/dL (ref 70–99)
HCT: 43 % (ref 39.0–52.0)
Hemoglobin: 14.6 g/dL (ref 13.0–17.0)
Potassium: 4.4 mmol/L (ref 3.5–5.1)
Sodium: 142 mmol/L (ref 135–145)
TCO2: 24 mmol/L (ref 22–32)

## 2019-10-05 SURGERY — CYSTOSCOPY/URETEROSCOPY/HOLMIUM LASER/STENT PLACEMENT
Anesthesia: General | Site: Ureter | Laterality: Left

## 2019-10-05 MED ORDER — ONDANSETRON HCL 4 MG/2ML IJ SOLN
INTRAMUSCULAR | Status: AC
  Filled 2019-10-05: qty 2

## 2019-10-05 MED ORDER — SODIUM CHLORIDE 0.9 % IR SOLN
Status: DC | PRN
  Administered 2019-10-05: 3000 mL via INTRAVESICAL

## 2019-10-05 MED ORDER — CIPROFLOXACIN IN D5W 400 MG/200ML IV SOLN
INTRAVENOUS | Status: AC
  Filled 2019-10-05: qty 200

## 2019-10-05 MED ORDER — PHENAZOPYRIDINE HCL 200 MG PO TABS: 200.0000 mg | ORAL_TABLET | Freq: Three times a day (TID) | ORAL | 0 refills | Status: DC | PRN

## 2019-10-05 MED ORDER — LACTATED RINGERS IV SOLN
INTRAVENOUS | Status: DC
  Administered 2019-10-05: 08:00:00 1000 mL via INTRAVENOUS
  Filled 2019-10-05: qty 1000

## 2019-10-05 MED ORDER — ACETAMINOPHEN 500 MG PO TABS
ORAL_TABLET | ORAL | Status: AC
  Filled 2019-10-05: qty 2

## 2019-10-05 MED ORDER — DEXAMETHASONE SODIUM PHOSPHATE 10 MG/ML IJ SOLN
INTRAMUSCULAR | Status: AC
  Filled 2019-10-05: qty 1

## 2019-10-05 MED ORDER — FENTANYL CITRATE (PF) 100 MCG/2ML IJ SOLN
25.0000 ug | INTRAMUSCULAR | Status: DC | PRN
  Filled 2019-10-05: qty 1

## 2019-10-05 MED ORDER — PROPOFOL 10 MG/ML IV BOLUS
INTRAVENOUS | Status: AC
  Filled 2019-10-05: qty 40

## 2019-10-05 MED ORDER — FENTANYL CITRATE (PF) 100 MCG/2ML IJ SOLN
INTRAMUSCULAR | Status: AC
  Filled 2019-10-05: qty 2

## 2019-10-05 MED ORDER — LIDOCAINE 2% (20 MG/ML) 5 ML SYRINGE
INTRAMUSCULAR | Status: AC
  Filled 2019-10-05: qty 5

## 2019-10-05 MED ORDER — DEXAMETHASONE SODIUM PHOSPHATE 10 MG/ML IJ SOLN
INTRAMUSCULAR | Status: DC | PRN
  Administered 2019-10-05: 10 mg via INTRAVENOUS

## 2019-10-05 MED ORDER — IOHEXOL 300 MG/ML  SOLN
INTRAMUSCULAR | Status: DC | PRN
  Administered 2019-10-05: 15 mL

## 2019-10-05 MED ORDER — FENTANYL CITRATE (PF) 100 MCG/2ML IJ SOLN
INTRAMUSCULAR | Status: DC | PRN
  Administered 2019-10-05: 50 ug via INTRAVENOUS

## 2019-10-05 MED ORDER — LIDOCAINE 2% (20 MG/ML) 5 ML SYRINGE
INTRAMUSCULAR | Status: DC | PRN
  Administered 2019-10-05: 80 mg via INTRAVENOUS

## 2019-10-05 MED ORDER — PHENYLEPHRINE 40 MCG/ML (10ML) SYRINGE FOR IV PUSH (FOR BLOOD PRESSURE SUPPORT)
PREFILLED_SYRINGE | INTRAVENOUS | Status: DC | PRN
  Administered 2019-10-05 (×2): 80 ug via INTRAVENOUS

## 2019-10-05 MED ORDER — ACETAMINOPHEN 500 MG PO TABS
1000.0000 mg | ORAL_TABLET | Freq: Once | ORAL | Status: AC
  Administered 2019-10-05: 08:00:00 1000 mg via ORAL
  Filled 2019-10-05: qty 2

## 2019-10-05 MED ORDER — CIPROFLOXACIN IN D5W 400 MG/200ML IV SOLN
400.0000 mg | Freq: Once | INTRAVENOUS | Status: AC
  Administered 2019-10-05: 09:00:00 400 mg via INTRAVENOUS
  Filled 2019-10-05: qty 200

## 2019-10-05 MED ORDER — HYDROCODONE-ACETAMINOPHEN 10-325 MG PO TABS: 1.0000 | ORAL_TABLET | ORAL | 0 refills | Status: DC | PRN

## 2019-10-05 MED ORDER — ONDANSETRON HCL 4 MG/2ML IJ SOLN
INTRAMUSCULAR | Status: DC | PRN
  Administered 2019-10-05: 4 mg via INTRAVENOUS

## 2019-10-05 MED ORDER — STERILE WATER FOR IRRIGATION IR SOLN
Status: DC | PRN
  Administered 2019-10-05: 1500 mL

## 2019-10-05 MED ORDER — PROPOFOL 10 MG/ML IV BOLUS
INTRAVENOUS | Status: DC | PRN
  Administered 2019-10-05: 150 mg via INTRAVENOUS

## 2019-10-05 SURGICAL SUPPLY — 25 items
BAG DRAIN URO-CYSTO SKYTR STRL (DRAIN) ×2 IMPLANT
BASKET ZERO TIP NITINOL 2.4FR (BASKET) ×2 IMPLANT
CATH FOLEY 2WAY SLVR  5CC 18FR (CATHETERS) ×1
CATH FOLEY 2WAY SLVR 5CC 18FR (CATHETERS) ×1 IMPLANT
CATH INTERMIT  6FR 70CM (CATHETERS) IMPLANT
CATH URET 5FR 28IN CONE TIP (BALLOONS)
CATH URET 5FR 70CM CONE TIP (BALLOONS) IMPLANT
CLOTH BEACON ORANGE TIMEOUT ST (SAFETY) ×2 IMPLANT
EXTRACTOR STONE 1.7FRX115CM (UROLOGICAL SUPPLIES) IMPLANT
FIBER LASER FLEXIVA 365 (UROLOGICAL SUPPLIES) IMPLANT
FIBER LASER TRAC TIP (UROLOGICAL SUPPLIES) ×2 IMPLANT
GLOVE BIO SURGEON STRL SZ8 (GLOVE) ×2 IMPLANT
GOWN STRL REUS W/TWL XL LVL3 (GOWN DISPOSABLE) ×2 IMPLANT
GUIDEWIRE ANG ZIPWIRE 038X150 (WIRE) ×2 IMPLANT
GUIDEWIRE STR DUAL SENSOR (WIRE) ×2 IMPLANT
IV NS IRRIG 3000ML ARTHROMATIC (IV SOLUTION) ×4 IMPLANT
KIT TURNOVER CYSTO (KITS) ×2 IMPLANT
MANIFOLD NEPTUNE II (INSTRUMENTS) ×2 IMPLANT
NS IRRIG 500ML POUR BTL (IV SOLUTION) ×2 IMPLANT
PACK CYSTO (CUSTOM PROCEDURE TRAY) ×2 IMPLANT
SHEATH URETERAL 12FRX28CM (UROLOGICAL SUPPLIES) ×2 IMPLANT
STENT URET 6FRX24 CONTOUR (STENTS) ×2 IMPLANT
TUBE CONNECTING 12X1/4 (SUCTIONS) ×2 IMPLANT
TUBING UROLOGY SET (TUBING) ×2 IMPLANT
WATER STERILE IRR 3000ML UROMA (IV SOLUTION) ×2 IMPLANT

## 2019-10-05 NOTE — Anesthesia Preprocedure Evaluation (Addendum)
Anesthesia Evaluation  Patient identified by MRN, date of birth, ID band Patient awake    Reviewed: Allergy & Precautions, NPO status , Patient's Chart, lab work & pertinent test results  Airway Mallampati: II  TM Distance: >3 FB Neck ROM: Full    Dental no notable dental hx.    Pulmonary sleep apnea and Continuous Positive Airway Pressure Ventilation , former smoker,    Pulmonary exam normal breath sounds clear to auscultation       Cardiovascular + CAD  Normal cardiovascular exam+ dysrhythmias (on eliquis) Atrial Fibrillation  Rhythm:Irregular Rate:Normal  HLD  TTE 2016 - Left ventricle: The cavity size was normal. Wall thickness was normal. Systolic function was normal. The estimated ejectionfraction was in the range of 60% to 65%. Doppler parameters areconsistent with abnormal left ventricular relaxation (grade 1diastolic dysfunction).  - Aortic valve: There was trivial regurgitation.  - Aorta: Aortic root dimension: 41 mm (ED).  - Ascending aorta: The ascending aorta was mildly dilated.  - Left atrium: The atrium was mildly dilated.   Stress Test 2016 Exercise Capacity:  Fair exercise capacity. BP Response:  Normal blood pressure response. Clinical Symptoms:  No significant symptoms noted. ECG Impression:  No significant ST segment change suggestive of ischemia. Comparison with Prior Nuclear Study: No images to compare   Neuro/Psych negative neurological ROS  negative psych ROS   GI/Hepatic Neg liver ROS, GERD  ,  Endo/Other  negative endocrine ROS  Renal/GU negative Renal ROS  negative genitourinary   Musculoskeletal  (+) Arthritis ,   Abdominal   Peds  Hematology negative hematology ROS (+)   Anesthesia Other Findings   Reproductive/Obstetrics                           Anesthesia Physical Anesthesia Plan  ASA: III  Anesthesia Plan: General   Post-op Pain Management:     Induction: Intravenous  PONV Risk Score and Plan: Ondansetron and Dexamethasone  Airway Management Planned: LMA  Additional Equipment:   Intra-op Plan:   Post-operative Plan: Extubation in OR  Informed Consent: I have reviewed the patients History and Physical, chart, labs and discussed the procedure including the risks, benefits and alternatives for the proposed anesthesia with the patient or authorized representative who has indicated his/her understanding and acceptance.     Dental advisory given  Plan Discussed with: CRNA  Anesthesia Plan Comments:         Anesthesia Quick Evaluation

## 2019-10-05 NOTE — Anesthesia Procedure Notes (Signed)
Procedure Name: LMA Insertion Date/Time: 10/05/2019 8:50 AM Performed by: [place] Lorali Khamis C, CRNA Pre-anesthesia Checklist: Patient identified, Emergency Drugs available, Suction available and Patient being monitored Patient Re-evaluated:Patient Re-evaluated prior to induction Oxygen Delivery Method: Circle system utilized Preoxygenation: Pre-oxygenation with 100% oxygen Induction Type: IV induction Ventilation: Mask ventilation without difficulty LMA: LMA inserted LMA Size: 4.0 Number of attempts: 1 Airway Equipment and Method: Bite block Placement Confirmation: positive ETCO2 Tube secured with: Tape Dental Injury: Teeth and Oropharynx as per pre-operative assessment

## 2019-10-05 NOTE — Discharge Instructions (Signed)
Post stone removal/stent placement surgery instructions   Definitions:  Ureter: The duct that transports urine from the kidney to the bladder. Stent: A plastic hollow tube that is placed into the ureter, from the kidney to the bladder to prevent the ureter from swelling shut.  General instructions:  Despite the fact that no skin incisions were used, the area around the ureter and bladder is raw and irritated. The stent is a foreign body which will further irritate the bladder wall. This irritation is manifested by increased frequency of urination, both day and night, and by an increase in the urge to urinate. In some, the urge to urinate is present almost always. Sometimes the urge is strong enough that you may not be able to stop your self from urinating. The only real cure is to remove the stent and then give time for the bladder wall to heal which can't be done until the danger of the ureter swelling shut has passed. (This varies from 2-21 days).  You may see some blood in your urine while the stent is in place and a few days afterward. Do not be alarmed, even if the urine is clear for a while. Get off your feet and drink lots of fluids until clearing occurs. If you start to pass clots or don't improve, call us.  If you have a string coming from your urethra:  The stent string is attached to your ureteral stent.  Do not pull on thisIf you have a string coming from your urethra:  The stent string is attached to your ureteral stent.  Do not pull on this.  Diet:  You may return to your normal diet immediately. Because of the raw surface of your bladder, alcohol, spicy foods, foods high in acid and drinks with caffeine may cause irritation or frequency and should be used in moderation. To keep your urine flowing freely and avoid constipation, drink plenty of fluids during the day (8-10 glasses). Tip: Avoid cranberry juice because it is very acidic.  Activity:  Your physical activity doesn't need  to be restricted. However, if you are very active, you may see some blood in the urine. We suggest that you reduce your activity under the circumstances until the bleeding has stopped.  Bowels:  It is important to keep your bowels regular during the postoperative period. Straining with bowel movements can cause bleeding. A bowel movement every other day is reasonable. Use a mild laxative if needed, such as milk of magnesia 2-3 tablespoons, or 2 Dulcolax tablets. Call if you continue to have problems. If you had been taking narcotics for pain, before, during or after your surgery, you may be constipated. Take a laxative if necessary.     Medication:  You should resume your pre-surgery medications unless told not to. DO NOT RESUME YOUR ASPIRIN, or any other medicines like ibuprofen, motrin, excedrin, advil, aleve, vitamin E, fish oil as these can all cause bleeding x 7 days. In addition you may be given an antibiotic to prevent or treat infection. Antibiotics are not always necessary. All medication should be taken as prescribed until the bottles are finished unless you are having an unusual reaction to one of the drugs.  Hold Eliquis for 48 hours after the date of surgery.  If the urine has cleared the Eliquis can be restarted.  If it remains bloody do not restart but contact my office for recommendations.  Problems you should report to [name]:  a. Fever greater than 101F. b. Heavy  bleeding, or clots (see notes above about blood in urine). c. Inability to urinate. d. Drug reactions (hives, rash, nausea, vomiting, diarrhea). e. Severe burning or pain with urination that is not improving.  Followup:  You will need a followup appointment to monitor your progress in most cases. Please call the office for this appointment when you get home if your appointment has not already been scheduled. Usually the first appointment will be about 5-14 days after your surgery and if you have a stent in place it  will likely be removed at that time.   Post Anesthesia Home Care Instructions  Activity: Get plenty of rest for the remainder of the day. A responsible individual must stay with you for 24 hours following the procedure.  For the next 24 hours, DO NOT: -Drive a car -Paediatric nurse -Drink alcoholic beverages -Take any medication unless instructed by your physician -Make any legal decisions or sign important papers.  Meals: Start with liquid foods such as gelatin or soup. Progress to regular foods as tolerated. Avoid greasy, spicy, heavy foods. If nausea and/or vomiting occur, drink only clear liquids until the nausea and/or vomiting subsides. Call your physician if vomiting continues.  Special Instructions/Symptoms: Your throat may feel dry or sore from the anesthesia or the breathing tube placed in your throat during surgery. If this causes discomfort, gargle with warm salt water. The discomfort should disappear within 24 hours.  If you had a scopolamine patch placed behind your ear for the management of post- operative nausea and/or vomiting:  1. The medication in the patch is effective for 72 hours, after which it should be removed.  Wrap patch in a tissue and discard in the trash. Wash hands thoroughly with soap and water. 2. You may remove the patch earlier than 72 hours if you experience unpleasant side effects which may include dry mouth, dizziness or visual disturbances. 3. Avoid touching the patch. Wash your hands with soap and water after contact with the patch.

## 2019-10-05 NOTE — Transfer of Care (Signed)
Immediate Anesthesia Transfer of Care Note  Patient: [name] [name]  Procedure(s) Performed: CYSTOSCOPY/RETROGRADE/URETEROSCOPY/HOLMIUM LASER/STENT PLACEMENT (Left Ureter)  Patient Location: PACU  Anesthesia Type:General  Level of Consciousness: awake, alert  and oriented  Airway & Oxygen Therapy: Patient Spontanous Breathing and Patient connected to nasal cannula oxygen  Post-op Assessment: Report given to RN and Post -op Vital signs reviewed and stable  Post vital signs: Reviewed and stable  Last Vitals:  Vitals Value Taken Time  BP 127/98 10/05/19 1009  Temp    Pulse 46 10/05/19 1012  Resp 26 10/05/19 1012  SpO2 97 % 10/05/19 1012  Vitals shown include unvalidated device data.  Last Pain:  Vitals:   10/05/19 0825  TempSrc: Oral      Patients Stated Pain Goal: 8 ([id number])  Complications: No apparent anesthesia complications

## 2019-10-05 NOTE — Op Note (Signed)
PATIENT:  [name] [name]  PRE-OPERATIVE DIAGNOSIS:  left Ureteral calculus  POST-OPERATIVE DIAGNOSIS: 1.  Left renal calculus Prostatic urethral lesion  PROCEDURE:  1. Cystoscopy with left retrograde pyelogram including interpretation. 2. Left ureteroscopy, laser lithotripsy and stone extraction with stent placement. 3. Cold cup biopsy of prostatic urethral lesion  SURGEON: Claybon Jabs, MD  INDICATION: [name] [name] is a 78 year old male with a left ureteral stone.  A CT scan on 07/19/2019 revealed a 4 mm stone located in the ureter on the left-hand side near the UPJ without obstruction.  Medical expulsive therapy was undertaken but was unsuccessful and therefore he is brought to the operating room for ureteroscopic management.  Findings: 1.  Left ureteral stone had migrated into the kidney and was lasered and extracted. 2.  Incidental finding of a 2 mm papillary appearing lesion in the prostatic urethra which was biopsied.  ANESTHESIA:  General  EBL: 25 cc  DRAINS: 6 French, 24 cm double-J stent in the left ureter with string and 18 French Foley catheter  SPECIMEN: 1.  Cold cup biopsy of prostatic urethral lesion to pathology. 2.  Stone given to patient.  DESCRIPTION OF PROCEDURE: The patient was taken to the major OR and placed on the table. General anesthesia was administered and then the patient was moved to the dorsal lithotomy position. The genitalia was sterilely prepped and draped. An official timeout was performed.  Initially the 23 French cystoscope with 30 lens was passed under direct vision down the urethra which was noted to be normal.  Upon entering the prostatic urethra just proximal to the level of the verumontanum appeared a 2 mm papillary appearing lesion located on the left wall of the prostatic urethra.  No other lesions were noted.  The bladder then entered and fully inspected. It was noted be free of any tumors, stones or inflammatory lesions. Ureteral orifices  were of normal configuration and position.   I used the cold cup biopsy forceps to biopsy the lesion on the left side of the prostatic urethra.  I then used the Bugbee electrode to fulgurate the biopsy site.  A 6 French open-ended ureteral catheter was then passed through the cystoscope into the ureteral orifice in order to perform a left retrograde pyelogram. A retrograde pyelogram was performed by injecting full-strength contrast up the left ureter under direct fluoroscopic control. It revealed a filling defect in the proximal ureter consistent with the stone seen on the preoperative KUB. The remainder of the ureter was noted to be normal as was the intrarenal collecting system. I then passed a 0.038 inch floppy-tipped guidewire through the open ended catheter and into the area of the renal pelvis and this was left in place. The inner portion of a ureteral access sheath was then passed over the guidewire to gently dilate the intramural ureter. I then proceeded with ureteroscopy.  A 6 French rigid ureteroscope was then passed under direct into the bladder and into the left orifice and up the ureter. The stone was identified within the renal pelvis as it appeared it had floated back into the pelvic region.  I engaged the stone in a 0 tip nitinol basket and tried to withdraw the stone but it was too large to extract and therefore elected to proceed with laser lithotripsy. The 200  holmium laser fiber was used to fragment the stone into 2 pieces. I then used the nitinol basket to extract all of the stone fragments and reinspection of the renal pelvis ureteroscopically  revealed no further stone fragments and no injury. I then backloaded the cystoscope over the guidewire and passed the stent over the guidewire into the area of the renal pelvis. As the guidewire was removed good curl was noted in the renal pelvis.  There was still some bleeding from the prostatic urethra and this was fulgurated.  I then elected to  place an 100 French Foley catheter the bladder was drained. The patient tolerated the procedure well no intraoperative complications.  PLAN OF CARE: Discharge to home after PACU  PATIENT DISPOSITION:  PACU - hemodynamically stable.

## 2019-10-06 ENCOUNTER — Other Ambulatory Visit: Payer: Self-pay | Admitting: Student in an Organized Health Care Education/Training Program

## 2019-10-06 MED ORDER — TAMSULOSIN HCL 0.4 MG PO CAPS: 0.4000 mg | ORAL_CAPSULE | Freq: Every day | ORAL | 0 refills | Status: AC

## 2019-10-06 MED ORDER — TAMSULOSIN HCL 0.4 MG PO CAPS: 0.4000 mg | ORAL_CAPSULE | Freq: Every day | ORAL | Status: DC

## 2019-10-06 NOTE — Progress Notes (Signed)
Flomax prescribed for stent discomfort

## 2019-10-07 NOTE — Anesthesia Postprocedure Evaluation (Signed)
Anesthesia Post Note  Patient: [name] [name]  Procedure(s) Performed: CYSTOSCOPY/RETROGRADE/URETEROSCOPY/HOLMIUM LASER/STENT PLACEMENT (Left Ureter)     Patient location during evaluation: PACU Anesthesia Type: General Level of consciousness: awake and alert Pain management: pain level controlled Vital Signs Assessment: post-procedure vital signs reviewed and stable Respiratory status: spontaneous breathing, nonlabored ventilation, respiratory function stable and patient connected to nasal cannula oxygen Cardiovascular status: blood pressure returned to baseline and stable Postop Assessment: no apparent nausea or vomiting Anesthetic complications: no    Last Vitals:  Vitals:   10/05/19 1045 10/05/19 1145  BP: (!) 127/99 (!) 116/98  Pulse: 77 64  Resp: 15 14  Temp:  36.5 C  SpO2: 94% 98%    Last Pain:  Vitals:   10/05/19 1145  TempSrc:   PainSc: 0-No pain                 Chelsey L Woodrum

## 2019-10-08 ENCOUNTER — Encounter (HOSPITAL_BASED_OUTPATIENT_CLINIC_OR_DEPARTMENT_OTHER): Payer: Self-pay | Admitting: Urology

## 2019-10-08 ENCOUNTER — Other Ambulatory Visit: Payer: Self-pay

## 2019-10-08 DIAGNOSIS — N201 Calculus of ureter: Secondary | ICD-10-CM | POA: Diagnosis not present

## 2019-10-08 LAB — SURGICAL PATHOLOGY

## 2019-10-08 NOTE — Progress Notes (Signed)
Pt states the pharmacist would not fill his pain medication due to strength, pt states he suffered all weekend. Was able to get a different pain medication ordered on Saturday called in but it didn't work well. Pt is going for catheter removal today at 11 and will discuss pain medication then with provider.

## 2019-10-10 ENCOUNTER — Ambulatory Visit: Payer: Medicare Other | Admitting: Cardiology

## 2019-10-10 ENCOUNTER — Other Ambulatory Visit: Payer: Self-pay

## 2019-10-10 ENCOUNTER — Encounter: Payer: Self-pay | Admitting: Cardiology

## 2019-10-10 VITALS — BP 124/70 | HR 83 | Temp 97.0°F | Ht 70.0 in | Wt 217.0 lb

## 2019-10-10 DIAGNOSIS — I4819 Other persistent atrial fibrillation: Secondary | ICD-10-CM

## 2019-10-10 DIAGNOSIS — Z9989 Dependence on other enabling machines and devices: Secondary | ICD-10-CM

## 2019-10-10 DIAGNOSIS — G4733 Obstructive sleep apnea (adult) (pediatric): Secondary | ICD-10-CM

## 2019-10-10 DIAGNOSIS — R001 Bradycardia, unspecified: Secondary | ICD-10-CM | POA: Diagnosis not present

## 2019-10-10 NOTE — Progress Notes (Signed)
PCP: Mosie Lukes, MD  Clinic Note: Chief Complaint  Patient presents with  . Follow-up    12 months.  . Atrial Fibrillation    Persistent, asymptomatic    HPI:    [name] [name] is a 77 y.o. male with Persistent AFib (auto rate controlled, on Eliquis) who is being seen today for annual f/u.  [name] was last seen on 10/10/2018 (1st visit in 3 yrs)  --> Eliquis was started March 2018.  A. fib well controlled  Recent Hospitalizations:   10/05/2019 - Ureteral Stone--thankfully, minimal bleeding --> admitted for cystoscopy ureteroscopy and stone removal  Reviewed  CV studies:   The following studies were reviewed today: (if available, images/films reviewed: From Epic Chart or Care Everywhere) . None    Interval History:   [name] [name] is here today along with his daughter [name] [name], [name]) doing very well.  She says that she cannot keep up with him at his age.  He is very active with no complaints of any sensation of irregular heartbeat or palpitations.  He has essentially no symptoms of atrial fibrillation.  He denies any lightheadedness, dizziness or wooziness.  No sense of exercise intolerance.  He says he is pretty much always on the go, does not really get routine exercise, but never stops moving.  He likes working in his shop, and walks all over the place.  Cardiovascular review of symptoms: no chest pain or dyspnea on exertion negative for - edema, irregular heartbeat, orthopnea, palpitations, paroxysmal nocturnal dyspnea, rapid heart rate, shortness of breath or Syncope/near syncope, TIA/amaurosis fugax, melena, hematochezia, hematuria, epistaxis.  Only minor bruising.  The patient does not have symptoms concerning for COVID-19 infection (fever, chills, cough, or new shortness of breath).  The patient is practicing social distancing.  Not always adherent to masking.  ++ Groceries/shopping -> does wear his mask in these locations    REVIEWED OF SYSTEMS    ROS: A comprehensive was performed. Review of Systems  Constitutional: Negative for malaise/fatigue and weight loss.  HENT: Negative for congestion.   Respiratory: Negative for cough and shortness of breath.   Gastrointestinal: Negative for blood in stool, heartburn and melena.  Genitourinary: Negative for hematuria (Just a little bit after stone removal.  No further).       Had significant flank pain associated with ureteral stone.  Doing much better since removed.  No hematuria.  Musculoskeletal: Negative for joint pain and myalgias.  Neurological: Negative for dizziness and focal weakness.  Psychiatric/Behavioral: Negative for memory loss. The patient has insomnia. The patient is not nervous/anxious.     I have reviewed and (if needed) personally updated the patient's problem list, medications, allergies, past medical and surgical history, social and family history.   PAST MEDICAL HISTORY   Past Medical History:  Diagnosis Date  . Anxiety   . Atrial fibrillation ([place])   . BPH (benign prostatic hypertrophy)   . Bradycardia 12/30/2014   HR routinely in mid 40s-50s  . Coronary artery disease (CAD) excluded April 2016   False-positive nuclear stress test suggesting inferior ischemia  . Elevated PSA   . Encounter for Medicare annual wellness exam 09/22/2013   Sees Dr Wilhemina Bonito of Derm Sees Dr Silvano Rusk of Gastroenterology Sees Dr Kathie Rhodes of [facility] Sees Dr Susa Day of Optometry  . GERD (gastroesophageal reflux disease)    controlled w/ diet and behavioral changes, history of  . Grade I diastolic dysfunction [id number]  . History  of kidney stones   . Hypertriglyceridemia 03/16/2015  . Lymphadenitis   . Melanoma of back ([place])    Excised Dr Wilhemina Bonito  . Mild ascending aorta dilatation (HCC)   . New onset a-fib ([place]) 03/10/2015  . OA (osteoarthritis)   . Otitis, externa, infective 08/10/2015  . Palpitations   . Post herpetic neuralgia   . Shingles 07/09/2013  .  Sleep apnea 07/30/2011   cpap- 13   . Snoring disorder 07/30/2011     PAST SURGICAL HISTORY   Past Surgical History:  Procedure Laterality Date  . CATARACT EXTRACTION Right   . COLONOSCOPY  2018  . CYSTOSCOPY/URETEROSCOPY/HOLMIUM LASER/STENT PLACEMENT Left 10/05/2019   Procedure: CYSTOSCOPY/RETROGRADE/URETEROSCOPY/HOLMIUM LASER/STENT PLACEMENT;  Surgeon: Kathie Rhodes, MD;  Location[facility];  Service: Urology;  Laterality: Left;  . EYE SURGERY Left 12/06/2017   cataract by Dr Gillian Scarce  . INGUINAL HERNIA REPAIR Bilateral 06/01/2019   Procedure: LAPAROSCOPIC LEFT AND  RIGHT INGUINAL HERNIA REPAIR WITH MESH;  Surgeon: Michael Boston, MD;  Location: [facility];  Service: General;  Laterality: Bilateral;  . LEFT HEART CATHETERIZATION WITH CORONARY ANGIOGRAM N/A 04/10/2015   Procedure: LEFT HEART CATHETERIZATION WITH  CORONARY ANGIOGRAM;  Surgeon: Leonie Man, MD;  Location: [facility] CATH LAB;  Service: Cardiovascular;  Angiographicallly NORMAL CORONARY ARTERIES  . MASS EXCISION N/A 05/16/2014   Procedure: EXCISION POSTERIOR NECK MASS, RIGHT CHEST WALL MASS AND RIGHT AXILLARY MASS AXILLARY NODE DISSECTION;  Surgeon: Adin Hector, MD;  Location: [facility];  Service: General;  Laterality: N/A;  . NM MYOVIEW LTD  03/19/2015   FALSE POSITIVE:  INTERMEDIATE RISK. Small sized, moderate intensity inferior ischemic perfusion defect  . TRANSTHORACIC ECHOCARDIOGRAM  03/20/2015   Normal LV size and function. EF 60-65%. G1 DD. Trivial AI. Borderline aortic root dilation (41 mm), Mild LA dilation.     MEDICATIONS/ALLERGIES   Current Meds  Medication Sig  . Cholecalciferol (VITAMIN D3) 5000 units CAPS Take 1 capsule by mouth daily.  Marland Kitchen ELIQUIS 5 MG TABS tablet Take 1 tablet by mouth twice daily  . gabapentin (NEURONTIN) 300 MG capsule Take 3 capsules (900 mg total) by mouth 3 (three) times daily.  . Multiple Vitamin (MULTIVITAMIN WITH MINERALS) TABS tablet Take 1 tablet by mouth  daily.  . tamsulosin (FLOMAX) 0.4 MG CAPS capsule Take 1 capsule (0.4 mg total) by mouth at bedtime for 7 days.  . [DISCONTINUED] diazepam (VALIUM) 5 MG tablet Take 1 tablet (5 mg total) by mouth every 6 (six) hours as needed for anxiety.  . [DISCONTINUED] HYDROcodone-acetaminophen (NORCO) 10-325 MG tablet Take 1-2 tablets by mouth every 4 (four) hours as needed for moderate pain. Maximum dose per 24 hours - 8 pills  . [DISCONTINUED] phenazopyridine (PYRIDIUM) 200 MG tablet Take 1 tablet (200 mg total) by mouth 3 (three) times daily as needed for pain.    No Known Allergies   SOCIAL HISTORY/FAMILY HISTORY   Social History   Tobacco Use  . Smoking status: Former Smoker    Years: 27.00    Types: Pipe, Cigars    Quit date: 02/11/1989    Years since quitting: 30.6  . Smokeless tobacco: Never Used  Substance Use Topics  . Alcohol use: Yes    Alcohol/week: 7.0 standard drinks    Types: 7 Glasses of wine per week    Comment: 1 per day -- brandy or wine  . Drug use: No   Social History   Social History Narrative   He is  a married father of 2, and grandfather of 11. He has been married to his wife [name] for 51 years. He previously lived in [place] but has moved with his wife to [place], [place] to be close to his daughter [name] and her family. [name] is our Nurse, adult.   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 He quit smoking in 1990. He has 6-7 glasses of brandy or wine a week.    He exercises regularly with low impact aerobics 2 days a week and daily walks of 30-45 minutes. His exercise sessions or 60 minutes. He will do some type of exercise at least 7 days a week and is very active doing yard work and wood work.      Patient is right-handed. He lives with his wife in a one level home with a basement. He drinks 4-5 cups of decaf coffee a day. He and his wife go to the gym 1-2 x a week.    family history includes Alcohol abuse  in his father and another family member; COPD in his paternal grandfather; COPD (age of onset: 53) in his brother; Cancer (age of onset: 54) in his sister; Cancer (age of onset: 61) in his mother; Diabetes (age of onset: 46) in his sister; Down syndrome (age of onset: 27) in his brother; Emphysema (age of onset: 63) in his father; Heart disease in his paternal grandmother; Obesity in his father, mother, and sister.   OBJCTIVE -PE, EKG, labs   Wt Readings from Last 3 Encounters:  10/11/19 214 lb 6.4 oz (97.3 kg)  10/10/19 217 lb (98.4 kg)  10/05/19 205 lb 11 oz (93.3 kg)    Physical Exam: BP 124/70 (BP Location: Left Arm, Patient Position: Sitting, Cuff Size: Normal)   Pulse 83   Temp (!) 97 F (36.1 C)   Ht 5\' 10"  (1.778 m)   Wt 217 lb (98.4 kg)   BMI 31.14 kg/m  Physical Exam  Constitutional: He is oriented to person, place, and time. He appears well-developed and well-nourished. No distress.  HENT:  Head: Normocephalic and atraumatic.  Neck: Normal range of motion. Neck supple. No hepatojugular reflux and no JVD present. Carotid bruit is not present.  Cardiovascular: Normal rate, normal heart sounds and intact distal pulses. An irregularly irregular rhythm present. PMI is not displaced. Exam reveals no gallop and no friction rub.  No murmur heard. Pulmonary/Chest: Effort normal and breath sounds normal. No respiratory distress. He has no wheezes. He has no rales.  Abdominal: Soft.  Musculoskeletal: Normal range of motion.        General: No edema.  Neurological: He is alert and oriented to person, place, and time.  Psychiatric: He has a normal mood and affect. His behavior is normal. Judgment and thought content normal.  Vitals reviewed.  To dig and  Adult EC doingG Report  Rate: 83 ;  RhyVisiting atrial fibrillation and Normal axis, intervals and durations.;   Narrative Interpretation: Stable EKG  Recent Labs: Followed by PCP.  Last labs noted are from May 2019-TC 131, TG  56, HDL 50, LDL 70. Lab Results  Component Value Date   WBC 8.9 10/11/2019   HGB 15.3 10/11/2019   HCT 45.2 10/11/2019   MCV 97.3 10/11/2019   PLT 214.0 10/11/2019   Lab Results  Component Value Date   CREATININE 0.91 10/11/2019   BUN 23 10/11/2019   NA 140 10/11/2019   K 4.4 10/11/2019  CL 104 10/11/2019   CO2 29 10/11/2019     ASSESSMENT/PLAN    Problem List Items Addressed This Visit    Persistent atrial fibrillation (HCC) - CHA2DS2-VASc Score (Age-16, Aortic Plaque - 1) (Chronic)    Pretty much totally asymptomatic.  Rate controlled without any AV nodal agent.  This patients CHA2DS2-VASc Score and unadjusted Ischemic Stroke Rate (% per year) is equal to 3.2 % stroke rate/year from a score of 3  Above score calculated as 1 point each if present [CHF, HTN, DM, Vascular=MI/PAD/Aortic Plaque, Age if 65-74, or Male];  2 points each if present [Age > 75, or Stroke/TIA/TE]   Started on Eliquis at age 5 with increased score to 3 from 2.  Tolerating Eliquis well without any bleeding issues.  Only potential bleeding issue was when he had episode of nephrolithiasis.  Discussed occasions when he could hold for bleeding.  Okay to hold for any procedures or surgeries. --Of note, his daughter is one of our clinical pharmacists who usually provides guidance on management of anticoagulation.      Relevant Orders   EKG 12-Lead (Completed)   OSA on CPAP (Chronic)    Does well with CPAP.  Sleeping fine no issues.      Bradycardia - Primary (Chronic)   Relevant Orders   EKG 12-Lead (Completed)       COVID-19 Education: The signs and symptoms of COVID-19 were discussed with the patient and how to seek care for testing (follow up with PCP or arrange E-visit).   The importance of social distancing was discussed today.  I spent a total of 47minutes with the patient and chart review. >  50% of the time was spent in direct patient consultation.  Additional time spent with chart  review (studies, outside notes, etc): 5 Total Time: 20 min   Current medicines are reviewed at length with the patient today.  (+/- concerns) none   Patient Instructions / Medication Changes & Studies & Tests Ordered   Patient Instructions  Medication Instructions:  No changes  If you need a refill on your cardiac medications before your next appointment, please call your pharmacy.   Lab work:  Not needed  Testing/Procedures: Not needed  Follow-Up: At [facility], you and your health needs are our priority.  As part of our continuing mission to provide you with exceptional heart care, we have created designated Provider Care Teams.  These Care Teams include your primary Cardiologist (physician) and Advanced Practice Providers (APPs -  Physician Assistants and Nurse Practitioners) who all work together to provide you with the care you need, when you need it. . You will need a follow up appointment in  29 month- Oct 2021.  Please call our office 2 months in advance to schedule this appointment.  You may see Glenetta Hew, MD or one of the following Advanced Practice Providers on your designated Care Team:   . Rosaria Ferries, PA-C . Jory Sims, DNP, ANP  Any Other Special Instructions Will Be Listed Below .    Studies Ordered:   Orders Placed This Encounter  Procedures  . EKG 12-Lead     Glenetta Hew, M.D., M.S. Interventional Cardiologist   Pager # [phone] Phone # [phone] [address] [place], [place] [address]   Thank you for choosing Heartcare at [facility]!!

## 2019-10-10 NOTE — Patient Instructions (Addendum)
Medication Instructions:  No changes  If you need a refill on your cardiac medications before your next appointment, please call your pharmacy.   Lab work:  Not needed  Testing/Procedures: Not needed  Follow-Up: At [facility], you and your health needs are our priority.  As part of our continuing mission to provide you with exceptional heart care, we have created designated Provider Care Teams.  These Care Teams include your primary Cardiologist (physician) and Advanced Practice Providers (APPs -  Physician Assistants and Nurse Practitioners) who all work together to provide you with the care you need, when you need it. . You will need a follow up appointment in  8 month- Oct 2021.  Please call our office 2 months in advance to schedule this appointment.  You may see Glenetta Hew, MD or one of the following Advanced Practice Providers on your designated Care Team:   . Rosaria Ferries, PA-C . Jory Sims, DNP, ANP  Any Other Special Instructions Will Be Listed Below .

## 2019-10-11 ENCOUNTER — Encounter: Payer: Self-pay | Admitting: Family Medicine

## 2019-10-11 ENCOUNTER — Ambulatory Visit (INDEPENDENT_AMBULATORY_CARE_PROVIDER_SITE_OTHER): Payer: Medicare Other | Admitting: Family Medicine

## 2019-10-11 ENCOUNTER — Other Ambulatory Visit: Payer: Self-pay | Admitting: Family Medicine

## 2019-10-11 ENCOUNTER — Other Ambulatory Visit: Payer: Self-pay

## 2019-10-11 VITALS — BP 122/62 | HR 82 | Temp 97.5°F | Resp 18 | Wt 214.4 lb

## 2019-10-11 DIAGNOSIS — I4819 Other persistent atrial fibrillation: Secondary | ICD-10-CM | POA: Diagnosis not present

## 2019-10-11 DIAGNOSIS — E781 Pure hyperglyceridemia: Secondary | ICD-10-CM | POA: Diagnosis not present

## 2019-10-11 DIAGNOSIS — R252 Cramp and spasm: Secondary | ICD-10-CM

## 2019-10-11 DIAGNOSIS — H9191 Unspecified hearing loss, right ear: Secondary | ICD-10-CM

## 2019-10-11 DIAGNOSIS — G4733 Obstructive sleep apnea (adult) (pediatric): Secondary | ICD-10-CM

## 2019-10-11 DIAGNOSIS — H9311 Tinnitus, right ear: Secondary | ICD-10-CM | POA: Diagnosis not present

## 2019-10-11 DIAGNOSIS — R739 Hyperglycemia, unspecified: Secondary | ICD-10-CM

## 2019-10-11 DIAGNOSIS — E559 Vitamin D deficiency, unspecified: Secondary | ICD-10-CM

## 2019-10-11 DIAGNOSIS — Z23 Encounter for immunization: Secondary | ICD-10-CM

## 2019-10-11 DIAGNOSIS — Z0000 Encounter for general adult medical examination without abnormal findings: Secondary | ICD-10-CM | POA: Diagnosis not present

## 2019-10-11 DIAGNOSIS — E669 Obesity, unspecified: Secondary | ICD-10-CM

## 2019-10-11 DIAGNOSIS — Z9989 Dependence on other enabling machines and devices: Secondary | ICD-10-CM

## 2019-10-11 DIAGNOSIS — B0229 Other postherpetic nervous system involvement: Secondary | ICD-10-CM

## 2019-10-11 LAB — COMPREHENSIVE METABOLIC PANEL
ALT: 18 U/L (ref 0–53)
AST: 18 U/L (ref 0–37)
Albumin: 4.1 g/dL (ref 3.5–5.2)
Alkaline Phosphatase: 54 U/L (ref 39–117)
BUN: 23 mg/dL (ref 6–23)
CO2: 29 mEq/L (ref 19–32)
Calcium: 9.2 mg/dL (ref 8.4–10.5)
Chloride: 104 mEq/L (ref 96–112)
Creatinine, Ser: 0.91 mg/dL (ref 0.40–1.50)
GFR: 80.84 mL/min (ref >60.00)
Glucose, Bld: 96 mg/dL (ref 70–99)
Potassium: 4.4 mEq/L (ref 3.5–5.1)
Sodium: 140 mEq/L (ref 135–145)
Total Bilirubin: 0.7 mg/dL (ref 0.2–1.2)
Total Protein: 6.8 g/dL (ref 6.0–8.3)

## 2019-10-11 LAB — CBC
HCT: 45.2 % (ref 39.0–52.0)
Hemoglobin: 15.3 g/dL (ref 13.0–17.0)
MCHC: 33.9 g/dL (ref 30.0–36.0)
MCV: 97.3 fl (ref 78.0–100.0)
Platelets: 214 10*3/uL (ref 150.0–400.0)
RBC: 4.65 Mil/uL (ref 4.22–5.81)
RDW: 13.5 % (ref 11.5–15.5)
WBC: 8.9 10*3/uL (ref 4.0–10.5)

## 2019-10-11 LAB — LIPID PANEL
Cholesterol: 138 mg/dL (ref 0–200)
HDL: 51.9 mg/dL (ref >39.00)
LDL Cholesterol: 69 mg/dL (ref 0–99)
NonHDL: 86.47
Total CHOL/HDL Ratio: 3
Triglycerides: 87 mg/dL (ref 0.0–149.0)
VLDL: 17.4 mg/dL (ref 0.0–40.0)

## 2019-10-11 LAB — TSH: TSH: 1.16 u[IU]/mL (ref 0.35–4.50)

## 2019-10-11 LAB — MAGNESIUM: Magnesium: 1.9 mg/dL (ref 1.5–2.5)

## 2019-10-11 MED ORDER — ALPRAZOLAM 0.25 MG PO TABS: ORAL_TABLET | ORAL | 1 refills | Status: DC

## 2019-10-11 NOTE — Patient Instructions (Addendum)
Weekly vitals  Multivitamin with minerals,Selenium, zinc, vitamin c and vitamin d   Hyland's leg cramp medicine   Hydrate with 60-80 ounces of water a day   Pulse Oximeter, want numbers in 90s   Preventive Care 76 Years and Older, Male Preventive care refers to lifestyle choices and visits with your health care provider that can promote health and wellness. This includes:  A yearly physical exam. This is also called an annual well check.  Regular dental and eye exams.  Immunizations.  Screening for certain conditions.  Healthy lifestyle choices, such as diet and exercise. What can I expect for my preventive care visit? Physical exam Your health care provider will check:  Height and weight. These may be used to calculate body mass index (BMI), which is a measurement that tells if you are at a healthy weight.  Heart rate and blood pressure.  Your skin for abnormal spots. Counseling Your health care provider may ask you questions about:  Alcohol, tobacco, and drug use.  Emotional well-being.  Home and relationship well-being.  Sexual activity.  Eating habits.  History of falls.  Memory and ability to understand (cognition).  Work and work Statistician. What immunizations do I need?  Influenza (flu) vaccine  This is recommended every year. Tetanus, diphtheria, and pertussis (Tdap) vaccine  You may need a Td booster every 10 years. Varicella (chickenpox) vaccine  You may need this vaccine if you have not already been vaccinated. Zoster (shingles) vaccine  You may need this after age 70. Pneumococcal conjugate (PCV13) vaccine  One dose is recommended after age 30. Pneumococcal polysaccharide (PPSV23) vaccine  One dose is recommended after age 66. Measles, mumps, and rubella (MMR) vaccine  You may need at least one dose of MMR if you were born in 1957 or later. You may also need a second dose. Meningococcal conjugate (MenACWY) vaccine  You may need  this if you have certain conditions. Hepatitis A vaccine  You may need this if you have certain conditions or if you travel or work in places where you may be exposed to hepatitis A. Hepatitis B vaccine  You may need this if you have certain conditions or if you travel or work in places where you may be exposed to hepatitis B. Haemophilus influenzae type b (Hib) vaccine  You may need this if you have certain conditions. You may receive vaccines as individual doses or as more than one vaccine together in one shot (combination vaccines). Talk with your health care provider about the risks and benefits of combination vaccines. What tests do I need? Blood tests  Lipid and cholesterol levels. These may be checked every 5 years, or more frequently depending on your overall health.  Hepatitis C test.  Hepatitis B test. Screening  Lung cancer screening. You may have this screening every year starting at age 67 if you have a 30-pack-year history of smoking and currently smoke or have quit within the past 15 years.  Colorectal cancer screening. All adults should have this screening starting at age 32 and continuing until age 67. Your health care provider may recommend screening at age 32 if you are at increased risk. You will have tests every 1-10 years, depending on your results and the type of screening test.  Prostate cancer screening. Recommendations will vary depending on your family history and other risks.  Diabetes screening. This is done by checking your blood sugar (glucose) after you have not eaten for a while (fasting). You may have this  done every 1-3 years.  Abdominal aortic aneurysm (AAA) screening. You may need this if you are a current or former smoker.  Sexually transmitted disease (STD) testing. Follow these instructions at home: Eating and drinking  Eat a diet that includes fresh fruits and vegetables, whole grains, lean protein, and low-fat dairy products. Limit your  intake of foods with high amounts of sugar, saturated fats, and salt.  Take vitamin and mineral supplements as recommended by your health care provider.  Do not drink alcohol if your health care provider tells you not to drink.  If you drink alcohol: ? Limit how much you have to 0-2 drinks a day. ? Be aware of how much alcohol is in your drink. In the [place] one drink equals one 12 oz bottle of beer (355 mL), one 5 oz glass of wine (148 mL), or one 1 oz glass of hard liquor (44 mL). Lifestyle  Take daily care of your teeth and gums.  Stay active. Exercise for at least 30 minutes on 5 or more days each week.  Do not use any products that contain nicotine or tobacco, such as cigarettes, e-cigarettes, and chewing tobacco. If you need help quitting, ask your health care provider.  If you are sexually active, practice safe sex. Use a condom or other form of protection to prevent STIs (sexually transmitted infections).  Talk with your health care provider about taking a low-dose aspirin or statin. What's next?  Visit your health care provider once a year for a well check visit.  Ask your health care provider how often you should have your eyes and teeth checked.  Stay up to date on all vaccines. This information is not intended to replace advice given to you by your health care provider. Make sure you discuss any questions you have with your health care provider. Document Released: 01/09/2016 Document Revised: 12/07/2018 Document Reviewed: 12/07/2018 Elsevier Patient Education  2020 [employer].

## 2019-10-12 ENCOUNTER — Encounter: Payer: Self-pay | Admitting: Cardiology

## 2019-10-12 DIAGNOSIS — N201 Calculus of ureter: Secondary | ICD-10-CM | POA: Diagnosis not present

## 2019-10-12 NOTE — Assessment & Plan Note (Signed)
Does well with CPAP.  Sleeping fine no issues.

## 2019-10-12 NOTE — Assessment & Plan Note (Addendum)
Pretty much totally asymptomatic.  Rate controlled without any AV nodal agent.  This patients CHA2DS2-VASc Score and unadjusted Ischemic Stroke Rate (% per year) is equal to 3.2 % stroke rate/year from a score of 3  Above score calculated as 1 point each if present [CHF, HTN, DM, Vascular=MI/PAD/Aortic Plaque, Age if 65-74, or Male];  2 points each if present [Age > 75, or Stroke/TIA/TE]   Started on Eliquis at age 78 with increased score to 3 from 2.  Tolerating Eliquis well without any bleeding issues.  Only potential bleeding issue was when he had episode of nephrolithiasis.  Discussed occasions when he could hold for bleeding.  Okay to hold for any procedures or surgeries. --Of note, his daughter is one of our clinical pharmacists who usually provides guidance on management of anticoagulation.

## 2019-10-12 NOTE — Assessment & Plan Note (Signed)
Not bradycardic today.  He does have some underlying tendency to bradycardia.  Probably not tacky bradycardia at this point. Simply would avoid AV nodal agents for rate control.  As he is asymptomatic we are avoiding antiarrhythmics.  We discussed signs and symptoms of concern for bradycardia such as syncope/near syncope or exercise intolerance/fatigue.  Currently asymptomatic.

## 2019-10-13 ENCOUNTER — Ambulatory Visit (HOSPITAL_BASED_OUTPATIENT_CLINIC_OR_DEPARTMENT_OTHER)
Admission: RE | Admit: 2019-10-13 | Discharge: 2019-10-13 | Disposition: A | Discharge status: Home / Self Care | Payer: Medicare Other | Source: Ambulatory Visit | Attending: Family Medicine | Admitting: Family Medicine

## 2019-10-13 ENCOUNTER — Other Ambulatory Visit: Payer: Self-pay

## 2019-10-13 DIAGNOSIS — H9191 Unspecified hearing loss, right ear: Secondary | ICD-10-CM | POA: Diagnosis not present

## 2019-10-13 DIAGNOSIS — H9311 Tinnitus, right ear: Secondary | ICD-10-CM | POA: Diagnosis not present

## 2019-10-13 MED ORDER — GADOBUTROL 1 MMOL/ML IV SOLN
10.0000 mL | Freq: Once | INTRAVENOUS | Status: AC | PRN
  Administered 2019-10-13: 10 mL via INTRAVENOUS

## 2019-10-14 DIAGNOSIS — H9311 Tinnitus, right ear: Secondary | ICD-10-CM | POA: Insufficient documentation

## 2019-10-14 DIAGNOSIS — H9191 Unspecified hearing loss, right ear: Secondary | ICD-10-CM | POA: Insufficient documentation

## 2019-10-14 DIAGNOSIS — R252 Cramp and spasm: Secondary | ICD-10-CM | POA: Insufficient documentation

## 2019-10-14 NOTE — Assessment & Plan Note (Signed)
Suffers from daily pain but is manageable on curreent meds

## 2019-10-14 NOTE — Assessment & Plan Note (Signed)
Using CPAP nightly 

## 2019-10-14 NOTE — Assessment & Plan Note (Signed)
Patient encouraged to maintain heart healthy diet, regular exercise, adequate sleep. Consider daily probiotics. Take medications as prescribed 

## 2019-10-14 NOTE — Assessment & Plan Note (Signed)
hgba1c acceptable, minimize simple carbs. Increase exercise as tolerated.  

## 2019-10-14 NOTE — Assessment & Plan Note (Signed)
Supplement and monitor 

## 2019-10-14 NOTE — Progress Notes (Signed)
Subjective:    Patient ID: [name] [name], male    DOB: 10-Jan-1941, 78 y.o.   MRN: [id number]  No chief complaint on file.   HPI Patient is in today for annual preventative exam and follow up on chronic medical concenrs including sleep apnea, a fib, vitamin D deficiency and more. He is maintaining quarantine well and he reports he feels well most days. He still has pain from his post herpetic neuralgia but ist is manageable with his meds. He is tolerating the Eliquis for his afib. He is noting an increse in right ear hearing loss and ringing in his ears most notably in his right ear. No injury or trauma. No headache or other neurologic complaint. Denies CP/palp/SOB/HA/congestion/fevers/GI or GU c/o. Taking meds as prescribed  Past Medical History:  Diagnosis Date  . Anxiety   . Atrial fibrillation ([place])   . BPH (benign prostatic hypertrophy)   . Bradycardia 12/30/2014   HR routinely in mid 40s-50s  . Coronary artery disease (CAD) excluded April 2016   False-positive nuclear stress test suggesting inferior ischemia  . Elevated PSA   . Encounter for Medicare annual wellness exam 09/22/2013   Sees Dr Wilhemina Bonito of Derm Sees Dr Silvano Rusk of Gastroenterology Sees Dr Kathie Rhodes of [facility] Sees Dr Susa Day of Optometry  . GERD (gastroesophageal reflux disease)    controlled w/ diet and behavioral changes, history of  . Grade I diastolic dysfunction [id number]  . History of kidney stones   . Hypertriglyceridemia 03/16/2015  . Lymphadenitis   . Melanoma of back ([place])    Excised Dr Wilhemina Bonito  . Mild ascending aorta dilatation (HCC)   . New onset a-fib ([place]) 03/10/2015  . OA (osteoarthritis)   . Otitis, externa, infective 08/10/2015  . Palpitations   . Post herpetic neuralgia   . Shingles 07/09/2013  . Sleep apnea 07/30/2011   cpap- 13   . Snoring disorder 07/30/2011    Past Surgical History:  Procedure Laterality Date  . CATARACT EXTRACTION Right   . COLONOSCOPY  2018  .  CYSTOSCOPY/URETEROSCOPY/HOLMIUM LASER/STENT PLACEMENT Left 10/05/2019   Procedure: CYSTOSCOPY/RETROGRADE/URETEROSCOPY/HOLMIUM LASER/STENT PLACEMENT;  Surgeon: Kathie Rhodes, MD;  [facility];  Service: Urology;  Laterality: Left;  . EYE SURGERY Left 12/06/2017   cataract by Dr Gillian Scarce  . INGUINAL HERNIA REPAIR Bilateral 06/01/2019   Procedure: LAPAROSCOPIC LEFT AND  RIGHT INGUINAL HERNIA REPAIR WITH MESH;  Surgeon: Michael Boston, MD;  Location: [place];  Service: General;  Laterality: Bilateral;  . LEFT HEART CATHETERIZATION WITH CORONARY ANGIOGRAM N/A 04/10/2015   Procedure: LEFT HEART CATHETERIZATION WITH  CORONARY ANGIOGRAM;  Surgeon: Leonie Man, MD;  Location: [facility] CATH LAB;  Service: Cardiovascular;  Angiographicallly NORMAL CORONARY ARTERIES  . MASS EXCISION N/A 05/16/2014   Procedure: EXCISION POSTERIOR NECK MASS, RIGHT CHEST WALL MASS AND RIGHT AXILLARY MASS AXILLARY NODE DISSECTION;  Surgeon: Adin Hector, MD;  Location: [facility];  Service: General;  Laterality: N/A;  . NM MYOVIEW LTD  03/19/2015   FALSE POSITIVE:  INTERMEDIATE RISK. Small sized, moderate intensity inferior ischemic perfusion defect  . TRANSTHORACIC ECHOCARDIOGRAM  03/20/2015   Normal LV size and function. EF 60-65%. G1 DD. Trivial AI. Borderline aortic root dilation (41 mm), Mild LA dilation.    Family History  Problem Relation Age of Onset  . Cancer Mother 25       MM, leukemia  . Obesity Mother   . Emphysema Father 38  .  Obesity Father   . Alcohol abuse Father   . Cancer Sister 72       brain  . COPD Brother 95  . Heart disease Paternal Grandmother   . COPD Paternal Grandfather   . Diabetes Sister 67  . Obesity Sister   . Down syndrome Brother 66       aspirated  . Alcohol abuse Other   . Colon cancer Neg Hx     Social History   Socioeconomic History  . Marital status: Married    Spouse name: [name] [name]  . Number of children: 2  . Years of education: Not on  file  . Highest education level: Not on file  Occupational History  . 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 Financial resource strain: Not on file  . Food insecurity    Worry: Not on file    Inability: Not on file  . Transportation needs    Medical: Not on file    Non-medical: Not on file  Tobacco Use  . Smoking status: Former Smoker    Years: 27.00    Types: Pipe, Cigars    Quit date: 02/11/1989    Years since quitting: 30.6  . Smokeless tobacco: Never Used  Substance and Sexual Activity  . Alcohol use: Yes    Alcohol/week: 7.0 standard drinks    Types: 7 Glasses of wine per week    Comment: 1 per day -- brandy or wine  . Drug use: No  . 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 Physical activity    Days per week: Not on file    Minutes per session: Not on file  . Stress: Not on file  Relationships  . Social Herbalist on phone: Not on file    Gets together: Not on file    Attends religious service: Not on file    Active member of club or organization: Not on file    Attends meetings of clubs or organizations: Not on file    Relationship status: Not on file  . Intimate partner violence    Fear of current or ex partner: Not on file    Emotionally abused: Not on file    Physically abused: Not on file    Forced sexual activity: Not on file  Other Topics Concern  . Not on file  Social History Narrative   He is a married father of 2, and grandfather of 72. He has been married to his wife [name] [name] for 51 years. He previously lived in [place] but has moved with his wife to [place], [place] to be close to his daughter [name] [name] and her family. 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 He quit smoking in 1990. He has 6-7 glasses of brandy or wine a week.    He exercises regularly with low  impact aerobics 2 days a week and daily walks of 30-45 minutes. His exercise sessions or 60 minutes. He will do some type of exercise at least 7 days a week and is very active doing yard work and wood work.      Patient is right-handed. He lives with his wife in a one level home with a basement. He drinks 4-5 cups of decaf coffee a day. He and his wife go to  the gym 1-2 x a week.    Outpatient Medications Prior to Visit  Medication Sig Dispense Refill  . Cholecalciferol (VITAMIN D3) 5000 units CAPS Take 1 capsule by mouth daily.    Marland Kitchen ELIQUIS 5 MG TABS tablet Take 1 tablet by mouth twice daily 180 tablet 1  . gabapentin (NEURONTIN) 300 MG capsule Take 3 capsules (900 mg total) by mouth 3 (three) times daily. 270 capsule 5  . Multiple Vitamin (MULTIVITAMIN WITH MINERALS) TABS tablet Take 1 tablet by mouth daily.    . tamsulosin (FLOMAX) 0.4 MG CAPS capsule Take 1 capsule (0.4 mg total) by mouth at bedtime for 7 days. 7 capsule 0  . diazepam (VALIUM) 5 MG tablet Take 1 tablet (5 mg total) by mouth every 6 (six) hours as needed for anxiety. 30 tablet 1  . HYDROcodone-acetaminophen (NORCO) 10-325 MG tablet Take 1-2 tablets by mouth every 4 (four) hours as needed for moderate pain. Maximum dose per 24 hours - 8 pills 20 tablet 0  . phenazopyridine (PYRIDIUM) 200 MG tablet Take 1 tablet (200 mg total) by mouth 3 (three) times daily as needed for pain. 20 tablet 0   No facility-administered medications prior to visit.     No Known Allergies  Review of Systems  Constitutional: Negative for chills, fever and malaise/fatigue.  HENT: Positive for hearing loss and tinnitus. Negative for congestion.   Eyes: Negative for discharge.  Respiratory: Negative for cough, sputum production and shortness of breath.   Cardiovascular: Negative for chest pain, palpitations and leg swelling.  Gastrointestinal: Negative for abdominal pain, blood in stool, constipation, diarrhea, heartburn, nausea and vomiting.   Genitourinary: Negative for dysuria, frequency, hematuria and urgency.  Musculoskeletal: Positive for joint pain and myalgias. Negative for back pain and falls.  Skin: Negative for rash.  Neurological: Negative for dizziness, sensory change, loss of consciousness, weakness and headaches.  Endo/Heme/Allergies: Negative for environmental allergies. Does not bruise/bleed easily.  Psychiatric/Behavioral: Negative for depression and suicidal ideas. The patient is not nervous/anxious and does not have insomnia.        Objective:    Physical Exam Vitals signs and nursing note reviewed.  Constitutional:      General: He is not in acute distress.    Appearance: Normal appearance. He is well-developed.  HENT:     Head: Normocephalic and atraumatic.     Nose: Nose normal.  Eyes:     General:        Right eye: No discharge.        Left eye: No discharge.  Neck:     Musculoskeletal: Normal range of motion and neck supple.  Cardiovascular:     Rate and Rhythm: Normal rate and regular rhythm.     Heart sounds: No murmur.  Pulmonary:     Effort: Pulmonary effort is normal.     Breath sounds: Normal breath sounds.  Abdominal:     General: Bowel sounds are normal.     Palpations: Abdomen is soft.     Tenderness: There is no abdominal tenderness.  Skin:    General: Skin is warm and dry.  Neurological:     Mental Status: He is alert and oriented to person, place, and time.     BP 122/62 (BP Location: Left Arm, Patient Position: Sitting, Cuff Size: Normal)   Pulse 82   Temp (!) 97.5 F (36.4 C) (Temporal)   Resp 18   Wt 214 lb 6.4 oz (97.3 kg)   SpO2 98%  BMI 30.76 kg/m  Wt Readings from Last 3 Encounters:  10/11/19 214 lb 6.4 oz (97.3 kg)  10/10/19 217 lb (98.4 kg)  10/05/19 205 lb 11 oz (93.3 kg)    Diabetic Foot Exam - Simple   No data filed     Lab Results  Component Value Date   WBC 8.9 10/11/2019   HGB 15.3 10/11/2019   HCT 45.2 10/11/2019   PLT 214.0 10/11/2019    GLUCOSE 96 10/11/2019   CHOL 138 10/11/2019   TRIG 87.0 10/11/2019   HDL 51.90 10/11/2019   LDLCALC 69 10/11/2019   ALT 18 10/11/2019   AST 18 10/11/2019   NA 140 10/11/2019   K 4.4 10/11/2019   CL 104 10/11/2019   CREATININE 0.91 10/11/2019   BUN 23 10/11/2019   CO2 29 10/11/2019   TSH 1.16 10/11/2019   PSA 4.94 (H) 01/15/2010   INR 1.22 04/04/2015   HGBA1C 5.5 09/10/2019    Lab Results  Component Value Date   TSH 1.16 10/11/2019   Lab Results  Component Value Date   WBC 8.9 10/11/2019   HGB 15.3 10/11/2019   HCT 45.2 10/11/2019   MCV 97.3 10/11/2019   PLT 214.0 10/11/2019   Lab Results  Component Value Date   NA 140 10/11/2019   K 4.4 10/11/2019   CO2 29 10/11/2019   GLUCOSE 96 10/11/2019   BUN 23 10/11/2019   CREATININE 0.91 10/11/2019   BILITOT 0.7 10/11/2019   ALKPHOS 54 10/11/2019   AST 18 10/11/2019   ALT 18 10/11/2019   PROT 6.8 10/11/2019   ALBUMIN 4.1 10/11/2019   CALCIUM 9.2 10/11/2019   GFR 80.84 10/11/2019   Lab Results  Component Value Date   CHOL 138 10/11/2019   Lab Results  Component Value Date   HDL 51.90 10/11/2019   Lab Results  Component Value Date   LDLCALC 69 10/11/2019   Lab Results  Component Value Date   TRIG 87.0 10/11/2019   Lab Results  Component Value Date   CHOLHDL 3 10/11/2019   Lab Results  Component Value Date   HGBA1C 5.5 09/10/2019       Assessment & Plan:   Problem List Items Addressed This Visit    OSA on CPAP (Chronic)    Using CPAP nightly.      Persistent atrial fibrillation (HCC) - CHA2DS2-VASc Score (Age-47, Aortic Plaque - 1) (Chronic)    Is tolerating Eliquis and is rate controlled.       Relevant Orders   CBC (Completed)   Lipid panel (Completed)   TSH (Completed)   Hypertriglyceridemia (Chronic)   Relevant Orders   Lipid panel (Completed)   Post herpetic neuralgia    Suffers from daily pain but is manageable on curreent meds      Preventative health care    Patient  encouraged to maintain heart healthy diet, regular exercise, adequate sleep. Consider daily probiotics. Take medications as prescribed.      Obesity    Intentional weight loss this year. ontinue the same dietary changes and stay active.       Vitamin D deficiency    Supplement and monitor      Hyperglycemia    hgba1c acceptable, minimize simple carbs. Increase exercise as tolerated.       Relevant Orders   Comprehensive metabolic panel (Completed)   Muscle cramps    Hydrate and check labs. Add Magnesium and [place] Leg cramp med and report if no improvement  Relevant Orders   Magnesium (Completed)   Hearing loss of right ear   Relevant Orders   MR BRAIN/IAC W WO CONTRAST (Completed)   Tinnitus of right ear    Will proceed with MR Angiogram to investigate. Consider ENT referral after that.       Relevant Orders   MR BRAIN/IAC W WO CONTRAST (Completed)    Other Visit Diagnoses    Needs flu shot    -  Primary   Relevant Orders   Flu Vaccine QUAD High Dose(Fluad)      I have discontinued Deangleo A. Herrera "Mike"'s phenazopyridine and HYDROcodone-acetaminophen. I am also having him maintain his multivitamin with minerals, Vitamin D3, gabapentin, and Eliquis.  No orders of the defined types were placed in this encounter.    [name], MD

## 2019-10-14 NOTE — Assessment & Plan Note (Signed)
Is tolerating Eliquis and is rate controlled.

## 2019-10-14 NOTE — Assessment & Plan Note (Signed)
Intentional weight loss this year. ontinue the same dietary changes and stay active.

## 2019-10-14 NOTE — Assessment & Plan Note (Signed)
Hydrate and check labs. Add Magnesium and Hyland Leg cramp med and report if no improvement

## 2019-10-14 NOTE — Assessment & Plan Note (Signed)
Will proceed with MR Angiogram to investigate. Consider ENT referral after that.

## 2019-11-14 ENCOUNTER — Telehealth (INDEPENDENT_AMBULATORY_CARE_PROVIDER_SITE_OTHER): Payer: Medicare Other | Admitting: Family Medicine

## 2019-11-14 ENCOUNTER — Encounter (INDEPENDENT_AMBULATORY_CARE_PROVIDER_SITE_OTHER): Payer: Self-pay | Admitting: Family Medicine

## 2019-11-14 ENCOUNTER — Other Ambulatory Visit: Payer: Self-pay

## 2019-11-14 DIAGNOSIS — Z711 Person with feared health complaint in whom no diagnosis is made: Secondary | ICD-10-CM | POA: Diagnosis not present

## 2019-11-14 DIAGNOSIS — E669 Obesity, unspecified: Secondary | ICD-10-CM

## 2019-11-14 DIAGNOSIS — E559 Vitamin D deficiency, unspecified: Secondary | ICD-10-CM | POA: Diagnosis not present

## 2019-11-14 DIAGNOSIS — Z6830 Body mass index (BMI) 30.0-30.9, adult: Secondary | ICD-10-CM | POA: Diagnosis not present

## 2019-11-19 NOTE — Progress Notes (Signed)
Office: [phone]  /  Fax: [phone] TeleHealth Visit:  [name] [name] has verbally consented to this TeleHealth visit today. The patient is located at home, the provider is located at the [facility] office. The participants in this visit include the listed provider and patient. The visit was conducted today via face time.  HPI:   Chief Complaint: OBESITY [name] [name] is here to discuss his progress with his obesity treatment plan. He is on the portion control better and make smarter food choices, such as increase vegetables and decrease simple carbohydrates and is following his eating plan approximately 80-85 % of the time. He states he is active with doing yard work and working in [employer]. [place] continues to do well maintaining his weight. 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 He was recently hospitalized for left uretal stone but is recovering well otherwise. He states his weight is 208 lbs today, and blood pressure at 114/63 with a pulse of 58. We were unable to weigh the patient today for this TeleHealth visit. He feels as if he has maintained his weight since his last visit. He has lost 30 lbs since starting treatment with [name].  Vitamin D Deficiency [name] [name] has a diagnosis of vitamin D deficiency. His Vit D level is well controlled on Vit D OTC 5,000 IU daily. He wants to know if he should continue this vitamin. He denies nausea, vomiting or muscle weakness.  Worried Well [name] [name] has questions about COVID19 and isolation, and possibly taking Vit C. He also had questions about blood type and COVID19, as he is type A.  ASSESSMENT AND PLAN:  Vitamin D deficiency  Worried well  Class 1 obesity with serious comorbidity and body mass index (BMI) of 30.0 to 30.9 in adult, unspecified obesity type  PLAN:  Vitamin D Deficiency [name] [name] was informed that low vitamin D levels contributes to fatigue and are  associated with obesity, breast, and colon cancer. [name] [name] was encouraged to continue taking Vit D as is, and will plan to recheck labs at his next in office visit after the holidays. He will follow up for routine testing of vitamin D, at least 2-3 times per year. He was informed of the risk of over-replacement of vitamin D and agrees to not increase his dose unless he discusses this with [name] first. [name] [name] agrees to follow up with our clinic in 6 weeks.  Worried Well [name] [name] is ok to take Vitamin C OTC and he was educated on the importance of isolation and masking, especially as he has a non O blood type. We discussed symptoms of COVID19 and we discussed the possible need for sequestration and how to deal with this if it happens. Pt offered guidance and reassurance.  I spent > than 50% of the 25 minute visit on counseling as documented in the note.  Obesity [name] [name] is currently in the action stage of change. As such, his goal is to continue with weight loss efforts He has agreed to portion control better and make smarter food choices, such as increase vegetables and decrease simple carbohydrates  [name] [name] has been instructed to work up to a goal of 150 minutes of combined cardio and strengthening exercise per week for weight loss and overall health benefits. We discussed the following Behavioral Modification Strategies today: holiday eating strategies    [name] [name] has agreed to follow up with our clinic in 6 weeks with myself. He was informed  of the importance of frequent follow up visits to maximize his success with intensive lifestyle modifications for his multiple health conditions.  ALLERGIES: No Known Allergies  MEDICATIONS: Current Outpatient Medications on File Prior to Visit  Medication Sig Dispense Refill  . Cholecalciferol (VITAMIN D3) 5000 units CAPS Take 1 capsule by mouth daily.    Marland Kitchen ELIQUIS 5 MG TABS tablet Take 1 tablet by mouth twice daily 180 tablet 1  . gabapentin (NEURONTIN) 300 MG  capsule Take 3 capsules (900 mg total) by mouth 3 (three) times daily. 270 capsule 5  . Multiple Vitamin (MULTIVITAMIN WITH MINERALS) TABS tablet Take 1 tablet by mouth daily.     No current facility-administered medications on file prior to visit.     PAST MEDICAL HISTORY: Past Medical History:  Diagnosis Date  . Anxiety   . Atrial fibrillation ([place])   . BPH (benign prostatic hypertrophy)   . Bradycardia 12/30/2014   HR routinely in mid 40s-50s  . Coronary artery disease (CAD) excluded April 2016   False-positive nuclear stress test suggesting inferior ischemia  . Elevated PSA   . Encounter for Medicare annual wellness exam 09/22/2013   Sees Dr Wilhemina Bonito of Derm Sees Dr Silvano Rusk of Gastroenterology Sees Dr Kathie Rhodes of [facility] Sees Dr Susa Day of Optometry  . GERD (gastroesophageal reflux disease)    controlled w/ diet and behavioral changes, history of  . Grade I diastolic dysfunction [id number]  . History of kidney stones   . Hypertriglyceridemia 03/16/2015  . Lymphadenitis   . Melanoma of back ([place])    Excised Dr Wilhemina Bonito  . Mild ascending aorta dilatation (HCC)   . New onset a-fib ([place]) 03/10/2015  . OA (osteoarthritis)   . Otitis, externa, infective 08/10/2015  . Palpitations   . Post herpetic neuralgia   . Shingles 07/09/2013  . Sleep apnea 07/30/2011   cpap- 13   . Snoring disorder 07/30/2011    PAST SURGICAL HISTORY: Past Surgical History:  Procedure Laterality Date  . CATARACT EXTRACTION Right   . COLONOSCOPY  2018  . CYSTOSCOPY/URETEROSCOPY/HOLMIUM LASER/STENT PLACEMENT Left 10/05/2019   Procedure: CYSTOSCOPY/RETROGRADE/URETEROSCOPY/HOLMIUM LASER/STENT PLACEMENT;  Surgeon: Kathie Rhodes, MD;  Location: [facility];  Service: Urology;  Laterality: Left;  . EYE SURGERY Left 12/06/2017   cataract by Dr Gillian Scarce  . INGUINAL HERNIA REPAIR Bilateral 06/01/2019   Procedure: LAPAROSCOPIC LEFT AND  RIGHT INGUINAL HERNIA REPAIR WITH MESH;  Surgeon:  Michael Boston, MD;  Location: [facility];  Service: General;  Laterality: Bilateral;  . LEFT HEART CATHETERIZATION WITH CORONARY ANGIOGRAM N/A 04/10/2015   Procedure: LEFT HEART CATHETERIZATION WITH  CORONARY ANGIOGRAM;  Surgeon: Leonie Man, MD;  Location: [facility] CATH LAB;  Service: Cardiovascular;  Angiographicallly NORMAL CORONARY ARTERIES  . MASS EXCISION N/A 05/16/2014   Procedure: EXCISION POSTERIOR NECK MASS, RIGHT CHEST WALL MASS AND RIGHT AXILLARY MASS AXILLARY NODE DISSECTION;  Surgeon: Adin Hector, MD;  [facility];  Service: General;  Laterality: N/A;  . NM MYOVIEW LTD  03/19/2015   FALSE POSITIVE:  INTERMEDIATE RISK. Small sized, moderate intensity inferior ischemic perfusion defect  . TRANSTHORACIC ECHOCARDIOGRAM  03/20/2015   Normal LV size and function. EF 60-65%. G1 DD. Trivial AI. Borderline aortic root dilation (41 mm), Mild LA dilation.    SOCIAL HISTORY: Social History   Tobacco Use  . Smoking status: Former Smoker    Years: 27.00    Types: Pipe, Cigars    Quit date: 02/11/1989  Years since quitting: 30.7  . Smokeless tobacco: Never Used  Substance Use Topics  . Alcohol use: Yes    Alcohol/week: 7.0 standard drinks    Types: 7 Glasses of wine per week    Comment: 1 per day -- brandy or wine  . Drug use: No    FAMILY HISTORY: Family History  Problem Relation Age of Onset  . Cancer Mother 61       MM, leukemia  . Obesity Mother   . Emphysema Father 76  . Obesity Father   . Alcohol abuse Father   . Cancer Sister 72       brain  . COPD Brother 18  . Heart disease Paternal Grandmother   . COPD Paternal Grandfather   . Diabetes Sister 57  . Obesity Sister   . Down syndrome Brother 93       aspirated  . Alcohol abuse Other   . Colon cancer Neg Hx     ROS: Review of Systems  Constitutional: Negative for weight loss.  Gastrointestinal: Negative for nausea and vomiting.  Musculoskeletal:       Negative muscle weakness     PHYSICAL EXAM: Pt in no acute distress  RECENT LABS AND TESTS: BMET    Component Value Date/Time   NA 140 10/11/2019 1139   NA 142 09/10/2019 1000   K 4.4 10/11/2019 1139   CL 104 10/11/2019 1139   CO2 29 10/11/2019 1139   GLUCOSE 96 10/11/2019 1139   BUN 23 10/11/2019 1139   BUN 27 09/10/2019 1000   CREATININE 0.91 10/11/2019 1139   CREATININE 0.92 04/04/2015 1113   CALCIUM 9.2 10/11/2019 1139   GFRNONAA 69 09/10/2019 1000   GFRAA 80 09/10/2019 1000   Lab Results  Component Value Date   HGBA1C 5.5 09/10/2019   HGBA1C 5.8 (H) 12/05/2018   HGBA1C 5.6 08/29/2018   HGBA1C 5.7 (H) 04/26/2018   HGBA1C 5.8 (H) 12/22/2017   Lab Results  Component Value Date   INSULIN 27.4 (H) 09/10/2019   INSULIN 6.7 12/05/2018   INSULIN 5.6 08/29/2018   INSULIN 6.4 04/26/2018   INSULIN 12.1 12/22/2017   CBC    Component Value Date/Time   WBC 8.9 10/11/2019 1139   RBC 4.65 10/11/2019 1139   HGB 15.3 10/11/2019 1139   HGB 15.5 08/18/2017 1059   HCT 45.2 10/11/2019 1139   HCT 46.8 08/18/2017 1059   PLT 214.0 10/11/2019 1139   MCV 97.3 10/11/2019 1139   MCV 96 08/18/2017 1059   MCH 31.8 08/18/2017 1059   MCH 28.8 04/04/2015 1113   MCHC 33.9 10/11/2019 1139   RDW 13.5 10/11/2019 1139   RDW 14.3 08/18/2017 1059   LYMPHSABS 1.7 08/18/2017 1059   MONOABS 0.7 09/13/2013 1350   EOSABS 0.1 08/18/2017 1059   BASOSABS 0.0 08/18/2017 1059   Iron/TIBC/Ferritin/ %Sat No results found for: IRON, TIBC, FERRITIN, IRONPCTSAT Lipid Panel     Component Value Date/Time   CHOL 138 10/11/2019 1139   CHOL 131 04/26/2018 0902   TRIG 87.0 10/11/2019 1139   HDL 51.90 10/11/2019 1139   HDL 50 04/26/2018 0902   CHOLHDL 3 10/11/2019 1139   VLDL 17.4 10/11/2019 1139   LDLCALC 69 10/11/2019 1139   LDLCALC 70 04/26/2018 0902   Hepatic Function Panel     Component Value Date/Time   PROT 6.8 10/11/2019 1139   PROT 6.3 09/10/2019 1000   ALBUMIN 4.1 10/11/2019 1139   ALBUMIN 4.2 09/10/2019 1000    AST 18  10/11/2019 1139   ALT 18 10/11/2019 1139   ALKPHOS 54 10/11/2019 1139   BILITOT 0.7 10/11/2019 1139   BILITOT 0.8 09/10/2019 1000   BILIDIR 0.1 02/11/2014 1016   IBILI 0.3 02/11/2014 1016      Component Value Date/Time   TSH 1.16 10/11/2019 1139   TSH 2.230 12/05/2018 0841   TSH 1.680 08/18/2017 1059      I, Trixie Dredge, am acting as transcriptionist for Dennard Nip, MD I have reviewed the above documentation for accuracy and completeness, and I agree with the above. -Dennard Nip, MD

## 2019-12-06 DIAGNOSIS — N200 Calculus of kidney: Secondary | ICD-10-CM | POA: Diagnosis not present

## 2019-12-06 DIAGNOSIS — N281 Cyst of kidney, acquired: Secondary | ICD-10-CM | POA: Diagnosis not present

## 2019-12-14 ENCOUNTER — Other Ambulatory Visit: Payer: Self-pay | Admitting: Cardiology

## 2019-12-31 ENCOUNTER — Ambulatory Visit (INDEPENDENT_AMBULATORY_CARE_PROVIDER_SITE_OTHER): Payer: Medicare Other | Admitting: Family Medicine

## 2019-12-31 ENCOUNTER — Other Ambulatory Visit: Payer: Self-pay

## 2019-12-31 ENCOUNTER — Encounter (INDEPENDENT_AMBULATORY_CARE_PROVIDER_SITE_OTHER): Payer: Self-pay | Admitting: Family Medicine

## 2019-12-31 VITALS — BP 133/81 | HR 84 | Temp 97.7°F | Ht 69.0 in | Wt 213.0 lb

## 2019-12-31 DIAGNOSIS — R7309 Other abnormal glucose: Secondary | ICD-10-CM | POA: Diagnosis not present

## 2019-12-31 DIAGNOSIS — R739 Hyperglycemia, unspecified: Secondary | ICD-10-CM | POA: Diagnosis not present

## 2019-12-31 DIAGNOSIS — E669 Obesity, unspecified: Secondary | ICD-10-CM | POA: Diagnosis not present

## 2019-12-31 DIAGNOSIS — Z6831 Body mass index (BMI) 31.0-31.9, adult: Secondary | ICD-10-CM

## 2019-12-31 DIAGNOSIS — E8881 Metabolic syndrome: Secondary | ICD-10-CM | POA: Diagnosis not present

## 2019-12-31 DIAGNOSIS — E559 Vitamin D deficiency, unspecified: Secondary | ICD-10-CM | POA: Diagnosis not present

## 2020-01-01 LAB — HEMOGLOBIN A1C
Est. average glucose Bld gHb Est-mCnc: 117 mg/dL
Hgb A1c MFr Bld: 5.7 % — ABNORMAL HIGH (ref 4.8–5.6)

## 2020-01-01 LAB — COMPREHENSIVE METABOLIC PANEL
ALT: 21 IU/L (ref 0–44)
AST: 19 IU/L (ref 0–40)
Albumin/Globulin Ratio: 1.8 (ref 1.2–2.2)
Albumin: 4.5 g/dL (ref 3.7–4.7)
Alkaline Phosphatase: 66 IU/L (ref 39–117)
BUN/Creatinine Ratio: 21 (ref 10–24)
BUN: 21 mg/dL (ref 8–27)
Bilirubin Total: 0.9 mg/dL (ref 0.0–1.2)
CO2: 26 mmol/L (ref 20–29)
Calcium: 9.9 mg/dL (ref 8.6–10.2)
Chloride: 104 mmol/L (ref 96–106)
Creatinine, Ser: 0.98 mg/dL (ref 0.76–1.27)
GFR calc Af Amer: 86 mL/min/{1.73_m2} (ref >59)
GFR calc non Af Amer: 75 mL/min/{1.73_m2} (ref >59)
Globulin, Total: 2.5 g/dL (ref 1.5–4.5)
Glucose: 88 mg/dL (ref 65–99)
Potassium: 4.9 mmol/L (ref 3.5–5.2)
Sodium: 144 mmol/L (ref 134–144)
Total Protein: 7 g/dL (ref 6.0–8.5)

## 2020-01-01 LAB — VITAMIN D 25 HYDROXY (VIT D DEFICIENCY, FRACTURES): Vit D, 25-Hydroxy: 53.2 ng/mL (ref 30.0–100.0)

## 2020-01-01 LAB — INSULIN, RANDOM: INSULIN: 10.5 u[IU]/mL (ref 2.6–24.9)

## 2020-01-01 NOTE — Progress Notes (Signed)
Chief Complaint: OBESITY [name] [name] is here to discuss his progress with his obesity treatment plan. [name] [name] is on the Category 3 Plan and states he is following his eating plan approximately 50% of the time. [name] [name] states he is staying busy with yard work and steps.   Today's visit was #: 26 Starting weight: 237 lbs Starting date: 08/18/2017 Today's weight: 213 lbs Today's date: 12/31/2019 Total lbs lost to date: 24  Total lbs lost since last in-office visit: 0  Subjective:   Interim History: [name] [name] has gained some weight over the holidays. He has been keeping busy with woodworking and carpentry. His goal is to get back to 205 lbs.  General review of systems is unchanged or negative.   Assessment/Plan:     ICD-10-CM   1. Insulin resistance  E88.81 HgB A1c    Insulin, random    Comprehensive Metabolic Panel (CMET)  2. Vitamin D deficiency  E55.9 Vitamin D (25 hydroxy)  3. Class 1 obesity with serious comorbidity and body mass index (BMI) of 31.0 to 31.9 in adult, unspecified obesity type  E66.9    Z68.31    1. Insulin resistance. [name] [name] has been working on diet and exercise. He denies signs of hypoglycemia. He is due to have labs checked.  [name] [name] will have labs checked and will continue diet and exercise.    2. Vitamin D deficiency. [name] [name] is on OTC Vitamin D. No nausea, vomiting, or muscle weakness. He is due to have labs checked.  [name] [name] will have labs checked and will continue OTC Vitamin D 5,000 units daily.    3. Class 1 obesity with serious comorbidity and body mass index (BMI) of 31.0 to 31.9 in adult, unspecified obesity type    [name] [name] is currently in the action stage of change. As such, his goal is to continue with weight loss efforts. He has agreed to follow the Category 3 Plan.   We discussed the following exercise goals today: [name] [name] adults should follow the adult guidelines. When [name] [name] adults cannot meet the adult guidelines, they should be as physically active as  their abilities and conditions will allow.  [name] [name] adults should do exercises that maintain or improve balance if they are at risk of falling.    We discussed the following behavioral modification strategies today: increasing lean protein intake and increasing vegetables.  [name] [name] has agreed to follow-up with our clinic in 8 weeks. He was informed of the importance of frequent follow-up visits to maximize his success with intensive lifestyle modifications for his multiple health conditions.  Objective:   Blood pressure 133/81, pulse 84, temperature 97.7 F (36.5 C), temperature source Oral, height 5\' 9"  (1.753 m), weight 213 lb (96.6 kg), SpO2 97 %. Body mass index is 31.45 kg/m.  General: Cooperative, alert, well developed, in no acute distress. HEENT: Conjunctivae and lids unremarkable. Neck: No thyromegaly.  Cardiovascular: Regular rhythm.  Lungs: Normal work of breathing. Extremities: No edema.  Neurologic: No focal deficits.   Lab Results  Component Value Date   CREATININE 0.98 12/31/2019   BUN 21 12/31/2019   NA 144 12/31/2019   K 4.9 12/31/2019   CL 104 12/31/2019   CO2 26 12/31/2019   Lab Results  Component Value Date   ALT 21 12/31/2019   AST 19 12/31/2019   ALKPHOS 66 12/31/2019   BILITOT 0.9 12/31/2019   Lab Results  Component Value Date   HGBA1C 5.7 (H) 12/31/2019   HGBA1C 5.5 09/10/2019  HGBA1C 5.8 (H) 12/05/2018   HGBA1C 5.6 08/29/2018   HGBA1C 5.7 (H) 04/26/2018   Lab Results  Component Value Date   INSULIN WILL FOLLOW 12/31/2019   INSULIN 27.4 (H) 09/10/2019   INSULIN 6.7 12/05/2018   INSULIN 5.6 08/29/2018   INSULIN 6.4 04/26/2018   Lab Results  Component Value Date   TSH 1.16 10/11/2019   Lab Results  Component Value Date   CHOL 138 10/11/2019   HDL 51.90 10/11/2019   LDLCALC 69 10/11/2019   TRIG 87.0 10/11/2019   CHOLHDL 3 10/11/2019   Lab Results  Component Value Date   WBC 8.9 10/11/2019   HGB 15.3 10/11/2019   HCT  45.2 10/11/2019   MCV 97.3 10/11/2019   PLT 214.0 10/11/2019   No results found for: IRON, TIBC, FERRITIN Obesity Behavioral Intervention Visit Documentation for Insurance (15 Minutes):   ASK: We discussed the diagnosis of obesity with [name] [name] today and [name] [name] agreed to give [name] permission to discuss obesity behavioral modification therapy today.  ASSESS: [name] [name] has the diagnosis of obesity and his BMI today is 31.6. [name] [name] is in the action stage of change.   ADVISE: [name] [name] was educated on the multiple health risks of obesity as well as the benefit of weight loss to improve his health. He was advised of the need for long term treatment and the importance of lifestyle modifications to improve his current health and to decrease his risk of future health problems.  AGREE: Multiple dietary modification options and treatment options were discussed and [name] [name] agreed to follow the recommendations documented in the above note.  ARRANGE: [name] [name] was educated on the importance of frequent visits to treat obesity as outlined per CMS and USPSTF guidelines and agreed to schedule his next follow up appointment today.  Attestation Statements:   Reviewed by clinician on day of visit: allergies, medications, problem list, medical history, surgical history, family history, social history and previous encounter notes.  This visit occurred during the SARS-CoV-2 public health emergency. Safety protocols were in place, including screening questions prior to the visit, additional usage of staff PPE, and extensive cleaning of exam room while observing appropriate contact time as indicated for disinfecting solutions. (CPT [id number])  I, Michaelene Song, am acting as transcriptionist for Dennard Nip, MD  I have reviewed the above documentation for accuracy and completeness, and I agree with the above. -  Dennard Nip, MD

## 2020-01-04 ENCOUNTER — Encounter: Payer: Self-pay | Admitting: Pharmacist

## 2020-01-11 ENCOUNTER — Ambulatory Visit: Payer: Medicare Other | Attending: Internal Medicine

## 2020-01-11 DIAGNOSIS — Z23 Encounter for immunization: Secondary | ICD-10-CM | POA: Insufficient documentation

## 2020-01-11 NOTE — Progress Notes (Signed)
   Covid-19 Vaccination Clinic  Name:  Caylon Grunow    MRN: [id number] DOB: November 04, 1941  01/11/2020  Mr. Turrentine was observed post Covid-19 immunization for 15 minutes without incidence. He was provided with Vaccine Information Sheet and instruction to access the V-Safe system.   Mr. Labella was instructed to call 911 with any severe reactions post vaccine: Marland Kitchen Difficulty breathing  . Swelling of your face and throat  . A fast heartbeat  . A bad rash all over your body  . Dizziness and weakness    Immunizations Administered    Name Date Dose VIS Date Route   Pfizer COVID-19 Vaccine 01/11/2020  8:29 AM 0.3 mL 12/07/2019 Intramuscular   Manufacturer: [employer]   Lot: [id number]   [place]: [id number]

## 2020-01-18 ENCOUNTER — Other Ambulatory Visit: Payer: Self-pay | Admitting: Family Medicine

## 2020-02-01 ENCOUNTER — Ambulatory Visit: Payer: Medicare Other | Attending: Internal Medicine

## 2020-02-01 DIAGNOSIS — Z23 Encounter for immunization: Secondary | ICD-10-CM | POA: Insufficient documentation

## 2020-02-01 NOTE — Progress Notes (Signed)
   Covid-19 Vaccination Clinic  Name:  [name] [name]    MRN: [id number] DOB: Mar 02, 1941  02/01/2020  Mr. [name] [name] was observed post Covid-19 immunization for 15 minutes without incidence. He was provided with Vaccine Information Sheet and instruction to access the V-Safe system.   Mr. [name] [name] was instructed to call 911 with any severe reactions post vaccine: Marland Kitchen Difficulty breathing  . Swelling of your face and throat  . A fast heartbeat  . A bad rash all over your body  . Dizziness and weakness    Immunizations Administered    Name Date Dose VIS Date Route   Pfizer COVID-19 Vaccine 02/01/2020  8:24 AM 0.3 mL 12/07/2019 Intramuscular   Manufacturer: [place]   Lot: [id number]   [place]: [id number]

## 2020-02-18 ENCOUNTER — Other Ambulatory Visit: Payer: Self-pay | Admitting: Family Medicine

## 2020-02-25 ENCOUNTER — Encounter (INDEPENDENT_AMBULATORY_CARE_PROVIDER_SITE_OTHER): Payer: Self-pay | Admitting: Family Medicine

## 2020-02-25 ENCOUNTER — Ambulatory Visit (INDEPENDENT_AMBULATORY_CARE_PROVIDER_SITE_OTHER): Payer: Medicare Other | Admitting: Family Medicine

## 2020-02-25 ENCOUNTER — Other Ambulatory Visit: Payer: Self-pay

## 2020-02-25 VITALS — BP 121/78 | HR 71 | Temp 97.6°F | Ht 69.0 in | Wt 209.0 lb

## 2020-02-25 DIAGNOSIS — E669 Obesity, unspecified: Secondary | ICD-10-CM

## 2020-02-25 DIAGNOSIS — E559 Vitamin D deficiency, unspecified: Secondary | ICD-10-CM

## 2020-02-25 DIAGNOSIS — Z6831 Body mass index (BMI) 31.0-31.9, adult: Secondary | ICD-10-CM

## 2020-02-25 DIAGNOSIS — R7303 Prediabetes: Secondary | ICD-10-CM

## 2020-02-25 NOTE — Progress Notes (Signed)
Chief Complaint:   OBESITY [name] [name] is here to discuss his progress with his obesity treatment plan along with follow-up of his obesity related diagnoses. [name] [name] is on the Category 3 Plan and states he is following his eating plan approximately 65% of the time. [name] [name] states he is doing 0 minutes 0 times per week.  Today's visit was #: 49 Starting weight: 237 lbs Starting date: 08/18/2017 Today's weight: 209 lbs Today's date: 02/25/2020 Total lbs lost to date: 28 Total lbs lost since last in-office visit: 4  Interim History: [name] [name] continues to do well with weight loss. He is mostly doing portion control and smarter choices, and sometimes does Category 3. He notes about 5 lb weight fluctuation from day to day.  Subjective:   1. Pre-diabetes [name] [name]'s A1c is slightly elevated at 5.7 from 5.5. He is not on metformin, and he is working on diet and weight loss. He notes decreased polyphagia. I discussed labs with the patient today.  2. Vitamin D deficiency [name] [name] is stable on Vit D OTC. His Vit D level is at goal and his energy is good.  Assessment/Plan:   1. Pre-diabetes [name] [name] will continue to work on weight loss, diet, exercise, and decreasing simple carbohydrates to help decrease the risk of diabetes. We will recheck labs in 2 months.  2. Vitamin D deficiency Low Vitamin D level contributes to fatigue and are associated with obesity, breast, and colon cancer. [name] [name] is to take OTC Vit D 5,000 IU daily and will follow-up for routine testing of Vitamin D, at least 2-3 times per year to avoid over-replacement. We will recheck labs in 2 months.  3. Class 1 obesity with serious comorbidity and body mass index (BMI) of 31.0 to 31.9 in adult, unspecified obesity type [name] [name] is currently in the action stage of change. As such, his goal is to continue with weight loss efforts. He has agreed to the Category 3 Plan or practicing portion control and making smarter food choices, such as increasing  vegetables and decreasing simple carbohydrates.   Behavioral modification strategies: increasing lean protein intake and meal planning and cooking strategies.  [name] [name] has agreed to follow-up with our clinic in 8 weeks. He was informed of the importance of frequent follow-up visits to maximize his success with intensive lifestyle modifications for his multiple health conditions.   Objective:   Blood pressure 121/78, pulse 71, temperature 97.6 F (36.4 C), temperature source Oral, height 5\' 9"  (1.753 m), weight 209 lb (94.8 kg), SpO2 95 %. Body mass index is 30.86 kg/m.  General: Cooperative, alert, well developed, in no acute distress. HEENT: Conjunctivae and lids unremarkable. Cardiovascular: Regular rhythm.  Lungs: Normal work of breathing. Neurologic: No focal deficits.   Lab Results  Component Value Date   CREATININE 0.98 12/31/2019   BUN 21 12/31/2019   NA 144 12/31/2019   K 4.9 12/31/2019   CL 104 12/31/2019   CO2 26 12/31/2019   Lab Results  Component Value Date   ALT 21 12/31/2019   AST 19 12/31/2019   ALKPHOS 66 12/31/2019   BILITOT 0.9 12/31/2019   Lab Results  Component Value Date   HGBA1C 5.7 (H) 12/31/2019   HGBA1C 5.5 09/10/2019   HGBA1C 5.8 (H) 12/05/2018   HGBA1C 5.6 08/29/2018   HGBA1C 5.7 (H) 04/26/2018   Lab Results  Component Value Date   INSULIN 10.5 12/31/2019   INSULIN 27.4 (H) 09/10/2019   INSULIN 6.7 12/05/2018   INSULIN 5.6 08/29/2018   INSULIN  6.4 04/26/2018   Lab Results  Component Value Date   TSH 1.16 10/11/2019   Lab Results  Component Value Date   CHOL 138 10/11/2019   HDL 51.90 10/11/2019   LDLCALC 69 10/11/2019   TRIG 87.0 10/11/2019   CHOLHDL 3 10/11/2019   Lab Results  Component Value Date   WBC 8.9 10/11/2019   HGB 15.3 10/11/2019   HCT 45.2 10/11/2019   MCV 97.3 10/11/2019   PLT 214.0 10/11/2019   No results found for: IRON, TIBC, FERRITIN  Obesity Behavioral Intervention Documentation for Insurance:    Approximately 15 minutes were spent on the discussion below.  ASK: We discussed the diagnosis of obesity with [name] [name] today and [name] [name] agreed to give [name] permission to discuss obesity behavioral modification therapy today.  ASSESS: [name] [name] has the diagnosis of obesity and his BMI today is 30.85. [name] [name] is in the action stage of change.   ADVISE: [name] [name] was educated on the multiple health risks of obesity as well as the benefit of weight loss to improve his health. He was advised of the need for long term treatment and the importance of lifestyle modifications to improve his current health and to decrease his risk of future health problems.  AGREE: Multiple dietary modification options and treatment options were discussed and [name] [name] agreed to follow the recommendations documented in the above note.  ARRANGE: [name] [name] was educated on the importance of frequent visits to treat obesity as outlined per CMS and USPSTF guidelines and agreed to schedule his next follow up appointment today.  Attestation Statements:   Reviewed by clinician on day of visit: allergies, medications, problem list, medical history, surgical history, family history, social history, and previous encounter notes.   I, Trixie Dredge, am acting as transcriptionist for Dennard Nip, MD.  I have reviewed the above documentation for accuracy and completeness, and I agree with the above. -  Dennard Nip, MD

## 2020-03-10 DIAGNOSIS — H52203 Unspecified astigmatism, bilateral: Secondary | ICD-10-CM | POA: Diagnosis not present

## 2020-03-10 DIAGNOSIS — H11001 Unspecified pterygium of right eye: Secondary | ICD-10-CM | POA: Diagnosis not present

## 2020-03-10 DIAGNOSIS — Z961 Presence of intraocular lens: Secondary | ICD-10-CM | POA: Diagnosis not present

## 2020-03-12 ENCOUNTER — Other Ambulatory Visit: Payer: Self-pay | Admitting: Family Medicine

## 2020-03-26 DIAGNOSIS — M19012 Primary osteoarthritis, left shoulder: Secondary | ICD-10-CM | POA: Diagnosis not present

## 2020-03-26 DIAGNOSIS — M25512 Pain in left shoulder: Secondary | ICD-10-CM | POA: Diagnosis not present

## 2020-04-09 ENCOUNTER — Other Ambulatory Visit: Payer: Self-pay | Admitting: Family Medicine

## 2020-04-10 ENCOUNTER — Ambulatory Visit: Payer: Medicare Other | Admitting: Family Medicine

## 2020-04-21 ENCOUNTER — Encounter (INDEPENDENT_AMBULATORY_CARE_PROVIDER_SITE_OTHER): Payer: Self-pay | Admitting: Family Medicine

## 2020-04-21 ENCOUNTER — Other Ambulatory Visit: Payer: Self-pay

## 2020-04-21 ENCOUNTER — Ambulatory Visit (INDEPENDENT_AMBULATORY_CARE_PROVIDER_SITE_OTHER): Payer: Medicare Other | Admitting: Family Medicine

## 2020-04-21 VITALS — BP 120/78 | HR 68 | Temp 97.5°F | Ht 69.0 in | Wt 214.0 lb

## 2020-04-21 DIAGNOSIS — R7303 Prediabetes: Secondary | ICD-10-CM

## 2020-04-21 DIAGNOSIS — E559 Vitamin D deficiency, unspecified: Secondary | ICD-10-CM | POA: Diagnosis not present

## 2020-04-21 DIAGNOSIS — Z6831 Body mass index (BMI) 31.0-31.9, adult: Secondary | ICD-10-CM

## 2020-04-21 DIAGNOSIS — E7849 Other hyperlipidemia: Secondary | ICD-10-CM | POA: Diagnosis not present

## 2020-04-21 DIAGNOSIS — E669 Obesity, unspecified: Secondary | ICD-10-CM | POA: Diagnosis not present

## 2020-04-22 LAB — COMPREHENSIVE METABOLIC PANEL
ALT: 19 IU/L (ref 0–44)
AST: 22 IU/L (ref 0–40)
Albumin/Globulin Ratio: 1.8 (ref 1.2–2.2)
Albumin: 4.4 g/dL (ref 3.7–4.7)
Alkaline Phosphatase: 63 IU/L (ref 39–117)
BUN/Creatinine Ratio: 25 — ABNORMAL HIGH (ref 10–24)
BUN: 24 mg/dL (ref 8–27)
Bilirubin Total: 0.5 mg/dL (ref 0.0–1.2)
CO2: 25 mmol/L (ref 20–29)
Calcium: 9.9 mg/dL (ref 8.6–10.2)
Chloride: 106 mmol/L (ref 96–106)
Creatinine, Ser: 0.97 mg/dL (ref 0.76–1.27)
GFR calc Af Amer: 87 mL/min/{1.73_m2} (ref >59)
GFR calc non Af Amer: 75 mL/min/{1.73_m2} (ref >59)
Globulin, Total: 2.4 g/dL (ref 1.5–4.5)
Glucose: 86 mg/dL (ref 65–99)
Potassium: 5 mmol/L (ref 3.5–5.2)
Sodium: 143 mmol/L (ref 134–144)
Total Protein: 6.8 g/dL (ref 6.0–8.5)

## 2020-04-22 LAB — LIPID PANEL WITH LDL/HDL RATIO
Cholesterol, Total: 157 mg/dL (ref 100–199)
HDL: 64 mg/dL (ref >39)
LDL Chol Calc (NIH): 81 mg/dL (ref 0–99)
LDL/HDL Ratio: 1.3 ratio (ref 0.0–3.6)
Triglycerides: 60 mg/dL (ref 0–149)
VLDL Cholesterol Cal: 12 mg/dL (ref 5–40)

## 2020-04-22 LAB — INSULIN, RANDOM: INSULIN: 5.1 u[IU]/mL (ref 2.6–24.9)

## 2020-04-22 LAB — HEMOGLOBIN A1C
Est. average glucose Bld gHb Est-mCnc: 114 mg/dL
Hgb A1c MFr Bld: 5.6 % (ref 4.8–5.6)

## 2020-04-22 LAB — VITAMIN D 25 HYDROXY (VIT D DEFICIENCY, FRACTURES): Vit D, 25-Hydroxy: 65.9 ng/mL (ref 30.0–100.0)

## 2020-04-23 NOTE — Progress Notes (Signed)
Chief Complaint:   OBESITY [name] [name] is here to discuss his progress with his obesity treatment plan along with follow-up of his obesity related diagnoses. [name] [name] is on the Category 3 Plan or practicing portion control and making smarter food choices, such as increasing vegetables and decreasing simple carbohydrates and states he is following his eating plan approximately 75% of the time. [name] [name] states he is staying busy with yard work and in the shop.  Today's visit was #: 32 Starting weight: 237 lbs Starting date: 08/18/2017 Today's weight: 214 lbs Today's date: 04/21/2020 Total lbs lost to date: 23 Total lbs lost since last in-office visit: 0  Interim History: [name] [name] went on vacation and did some celebration eating, and he gained a bit of weight but has already gotten back on track.  Subjective:   1. Pre-diabetes [name] [name] is doing well with diet and exercise. He is due for labs today. He denies hypoglycemia.  2. Vitamin D deficiency [name] [name] is on OTC Vit D 5,000 IU daily. He is due to have labs checked.  3. Other hyperlipidemia [name] [name] continues to do well with diet. He is due for labs.  Assessment/Plan:   1. Pre-diabetes [name] [name] will continue to work on weight loss, diet, exercise, and decreasing simple carbohydrates to help decrease the risk of diabetes. We will check labs today.  - Comprehensive metabolic panel - Hemoglobin A1c - Insulin, random  2. Vitamin D deficiency Low Vitamin D level contributes to fatigue and are associated with obesity, breast, and colon cancer. [name] [name] agreed to continue OTC Vit D 5,000 IU daily and will follow-up for routine testing of Vitamin D, at least 2-3 times per year to avoid over-replacement. We will check labs today.  - VITAMIN D 25 Hydroxy (Vit-D Deficiency, Fractures)  3. Other hyperlipidemia Cardiovascular risk and specific lipid/LDL goals reviewed. We discussed several lifestyle modifications today and [name] [name] will continue to work on diet,  exercise and weight loss efforts. We will check labs today. Orders and follow up as documented in patient record.   - Lipid Panel With LDL/HDL Ratio  4. Class 1 obesity with serious comorbidity and body mass index (BMI) of 31.0 to 31.9 in adult, unspecified obesity type [name] [name] is currently in the action stage of change. As such, his goal is to continue with weight loss efforts. He has agreed to the Category 3 Plan.   Exercise goals: As is.  Behavioral modification strategies: increasing lean protein intake.  [name] [name] has agreed to follow-up with our clinic in 8 weeks. He was informed of the importance of frequent follow-up visits to maximize his success with intensive lifestyle modifications for his multiple health conditions.   [name] [name] was informed we would discuss his lab results at his next visit unless there is a critical issue that needs to be addressed sooner. [name] [name] agreed to keep his next visit at the agreed upon time to discuss these results.  Objective:   Blood pressure 120/78, pulse 68, temperature (!) 97.5 F (36.4 C), temperature source Oral, height 5\' 9"  (1.753 m), weight 214 lb (97.1 kg), SpO2 100 %. Body mass index is 31.6 kg/m.  General: Cooperative, alert, well developed, in no acute distress. HEENT: Conjunctivae and lids unremarkable. Cardiovascular: Regular rhythm.  Lungs: Normal work of breathing. Neurologic: No focal deficits.   Lab Results  Component Value Date   CREATININE 0.97 04/21/2020   BUN 24 04/21/2020   NA 143 04/21/2020   K 5.0 04/21/2020   CL 106 04/21/2020   CO2  25 04/21/2020   Lab Results  Component Value Date   ALT 19 04/21/2020   AST 22 04/21/2020   ALKPHOS 63 04/21/2020   BILITOT 0.5 04/21/2020   Lab Results  Component Value Date   HGBA1C 5.6 04/21/2020   HGBA1C 5.7 (H) 12/31/2019   HGBA1C 5.5 09/10/2019   HGBA1C 5.8 (H) 12/05/2018   HGBA1C 5.6 08/29/2018   Lab Results  Component Value Date   INSULIN 5.1 04/21/2020   INSULIN 10.5  12/31/2019   INSULIN 27.4 (H) 09/10/2019   INSULIN 6.7 12/05/2018   INSULIN 5.6 08/29/2018   Lab Results  Component Value Date   TSH 1.16 10/11/2019   Lab Results  Component Value Date   CHOL 157 04/21/2020   HDL 64 04/21/2020   LDLCALC 81 04/21/2020   TRIG 60 04/21/2020   CHOLHDL 3 10/11/2019   Lab Results  Component Value Date   WBC 8.9 10/11/2019   HGB 15.3 10/11/2019   HCT 45.2 10/11/2019   MCV 97.3 10/11/2019   PLT 214.0 10/11/2019   No results found for: IRON, TIBC, FERRITIN  Obesity Behavioral Intervention Documentation for Insurance:   Approximately 15 minutes were spent on the discussion below.  ASK: We discussed the diagnosis of obesity with [name] [name] today and [name] [name] agreed to give [name] permission to discuss obesity behavioral modification therapy today.  ASSESS: [place] has the diagnosis of obesity and his BMI today is 31.59. [name] [name] is in the action stage of change.   ADVISE: [name] [name] was educated on the multiple health risks of obesity as well as the benefit of weight loss to improve his health. He was advised of the need for long term treatment and the importance of lifestyle modifications to improve his current health and to decrease his risk of future health problems.  AGREE: Multiple dietary modification options and treatment options were discussed and [name] [name] agreed to follow the recommendations documented in the above note.  ARRANGE: [name] [name] was educated on the importance of frequent visits to treat obesity as outlined per CMS and USPSTF guidelines and agreed to schedule his next follow up appointment today.  Attestation Statements:   Reviewed by clinician on day of visit: allergies, medications, problem list, medical history, surgical history, family history, social history, and previous encounter notes.   I, [name] [name], am acting as transcriptionist for Dennard Nip, MD.  I have reviewed the above documentation for accuracy and completeness, and I agree  with the above. -  Dennard Nip, MD

## 2020-04-28 ENCOUNTER — Ambulatory Visit (INDEPENDENT_AMBULATORY_CARE_PROVIDER_SITE_OTHER): Payer: Medicare Other | Admitting: Family Medicine

## 2020-04-28 ENCOUNTER — Encounter: Payer: Self-pay | Admitting: Family Medicine

## 2020-04-28 ENCOUNTER — Other Ambulatory Visit: Payer: Self-pay

## 2020-04-28 VITALS — BP 128/78 | HR 133 | Temp 96.0°F | Wt 221.0 lb

## 2020-04-28 DIAGNOSIS — L03113 Cellulitis of right upper limb: Secondary | ICD-10-CM | POA: Diagnosis not present

## 2020-04-28 MED ORDER — CEPHALEXIN 500 MG PO CAPS: 500.0000 mg | ORAL_CAPSULE | Freq: Three times a day (TID) | ORAL | 0 refills | Status: AC

## 2020-04-28 NOTE — Progress Notes (Signed)
Chief Complaint  Patient presents with  . Insect Bite    friday afternoon    [name] [name] is a 79 y.o. male w hx of chronic RUE lymphadenopathy here for a skin complaint.  Duration: 3 days Location: RUE Pruritic? No Painful? Yes Drainage? No Other associated symptoms: Swelling greater than usual; thinks he may have been bitten by a spider but unsure Therapies tried thus far: Aleve, Benadryl  Past Medical History:  Diagnosis Date  . Anxiety   . Atrial fibrillation ([place])   . BPH (benign prostatic hypertrophy)   . Bradycardia 12/30/2014   HR routinely in mid 40s-50s  . Coronary artery disease (CAD) excluded April 2016   False-positive nuclear stress test suggesting inferior ischemia  . Elevated PSA   . Encounter for Medicare annual wellness exam 09/22/2013   Sees Dr Wilhemina Bonito of Derm Sees Dr Silvano Rusk of Gastroenterology Sees Dr Kathie Rhodes of [facility] Sees Dr Susa Day of Optometry  . GERD (gastroesophageal reflux disease)    controlled w/ diet and behavioral changes, history of  . Grade I diastolic dysfunction [id number]  . History of kidney stones   . Hypertriglyceridemia 03/16/2015  . Lymphadenitis   . Melanoma of back ([place])    Excised Dr Wilhemina Bonito  . Mild ascending aorta dilatation ([facility])   . New onset a-fib ([place]) 03/10/2015  . OA (osteoarthritis)   . Otitis, externa, infective 08/10/2015  . Palpitations   . Post herpetic neuralgia   . Shingles 07/09/2013  . Sleep apnea 07/30/2011   cpap- 13   . Snoring disorder 07/30/2011    BP 128/78 (BP Location: Left Arm, Patient Position: Sitting, Cuff Size: Normal)   Pulse (!) 133   Temp (!) 96 F (35.6 C) (Temporal)   Wt 221 lb (100.2 kg)   SpO2 94%   BMI 32.64 kg/m  Gen: awake, alert, appearing stated age Lungs: No accessory muscle use Skin: See below. No drainage, erythema, TTP, fluctuance, excoriation; +pitting edema from elbow to wrist on R Psych: Age appropriate judgment and  insight        Cellulitis of right upper extremity - Plan: cephALEXin (KEFLEX) 500 MG capsule  Not sure he will need abx. Has chronic lymphedema, go back to sleeve. OK to stop Benadryl. Warning signs and symptoms verbalized and written down in AVS.  F/u prn. The patient voiced understanding and agreement to the plan.  [place], DO 04/28/20 11:17 AM

## 2020-04-28 NOTE — Patient Instructions (Signed)
Things to look out for: increasing pain not relieved by ibuprofen/acetaminophen, fevers, spreading redness, drainage of pus, or foul odor.  We sent in an antibiotic should you fail to continue to improve. I would wear your sleeve again. OK to stop the Benadryl.   Ice/cold pack over area for 10-15 min twice daily.  Stay hydrated.  Let us know if you need anything.

## 2020-05-08 ENCOUNTER — Other Ambulatory Visit: Payer: Self-pay | Admitting: Family Medicine

## 2020-06-18 ENCOUNTER — Other Ambulatory Visit: Payer: Self-pay | Admitting: Family Medicine

## 2020-06-23 ENCOUNTER — Other Ambulatory Visit: Payer: Self-pay

## 2020-06-23 ENCOUNTER — Encounter (INDEPENDENT_AMBULATORY_CARE_PROVIDER_SITE_OTHER): Payer: Self-pay | Admitting: Family Medicine

## 2020-06-23 ENCOUNTER — Ambulatory Visit (INDEPENDENT_AMBULATORY_CARE_PROVIDER_SITE_OTHER): Payer: Medicare Other | Admitting: Family Medicine

## 2020-06-23 VITALS — BP 120/86 | HR 94 | Temp 97.7°F | Ht 69.0 in | Wt 219.0 lb

## 2020-06-23 DIAGNOSIS — Z6832 Body mass index (BMI) 32.0-32.9, adult: Secondary | ICD-10-CM

## 2020-06-23 DIAGNOSIS — E669 Obesity, unspecified: Secondary | ICD-10-CM | POA: Diagnosis not present

## 2020-06-23 DIAGNOSIS — E559 Vitamin D deficiency, unspecified: Secondary | ICD-10-CM | POA: Diagnosis not present

## 2020-06-24 NOTE — Progress Notes (Signed)
Chief Complaint:   OBESITY [name] [name] is here to discuss his progress with his obesity treatment plan along with follow-up of his obesity related diagnoses. [name] [name] is on the Category 3 Plan and states he is following his eating plan approximately 50% of the time. [name] [name] states he did a lot of walking with traveling.  Today's visit was #: 65 Starting weight: 237 lbs Starting date: 08/18/2017 Today's weight: 219 lbs Today's date: 06/23/2020 Total lbs lost to date: 18 Total lbs lost since last in-office visit: 0  Interim History: [name] [name] struggled with weight loss due to a lot of traveling and eating out most meals. He tried to make good choices when he could. He states he is ready to get back on track.  Subjective:   1. Vitamin D deficiency [name] [name]'s last Vit D is stable, and he is due to have labs rechecked next month. He notes his fatigue has improved.  Assessment/Plan:   1. Vitamin D deficiency Low Vitamin D level contributes to fatigue and are associated with obesity, breast, and colon cancer. [name] [name] will continue diet and exercise, and we will recheck labs in 1 month. He will follow-up for routine testing of Vitamin D, at least 2-3 times per year to avoid over-replacement.  2. Class 1 obesity with serious comorbidity and body mass index (BMI) of 32.0 to 32.9 in adult, unspecified obesity type [name] [name] is currently in the action stage of change. As such, his goal is to continue with weight loss efforts. He has agreed to the Category 3 Plan.   Exercise goals: As is.  Behavioral modification strategies: increasing lean protein intake and meal planning and cooking strategies.  [name] [name] has agreed to follow-up with our clinic in 4 weeks. He was informed of the importance of frequent follow-up visits to maximize his success with intensive lifestyle modifications for his multiple health conditions.   Objective:   Blood pressure 120/86, pulse 94, temperature 97.7 F (36.5 C), temperature source  Oral, height 5\' 9"  (1.753 m), weight 219 lb (99.3 kg), SpO2 96 %. Body mass index is 32.34 kg/m.  General: Cooperative, alert, well developed, in no acute distress. HEENT: Conjunctivae and lids unremarkable. Cardiovascular: Regular rhythm.  Lungs: Normal work of breathing. Neurologic: No focal deficits.   Lab Results  Component Value Date   CREATININE 0.97 04/21/2020   BUN 24 04/21/2020   NA 143 04/21/2020   K 5.0 04/21/2020   CL 106 04/21/2020   CO2 25 04/21/2020   Lab Results  Component Value Date   ALT 19 04/21/2020   AST 22 04/21/2020   ALKPHOS 63 04/21/2020   BILITOT 0.5 04/21/2020   Lab Results  Component Value Date   HGBA1C 5.6 04/21/2020   HGBA1C 5.7 (H) 12/31/2019   HGBA1C 5.5 09/10/2019   HGBA1C 5.8 (H) 12/05/2018   HGBA1C 5.6 08/29/2018   Lab Results  Component Value Date   INSULIN 5.1 04/21/2020   INSULIN 10.5 12/31/2019   INSULIN 27.4 (H) 09/10/2019   INSULIN 6.7 12/05/2018   INSULIN 5.6 08/29/2018   Lab Results  Component Value Date   TSH 1.16 10/11/2019   Lab Results  Component Value Date   CHOL 157 04/21/2020   HDL 64 04/21/2020   LDLCALC 81 04/21/2020   TRIG 60 04/21/2020   CHOLHDL 3 10/11/2019   Lab Results  Component Value Date   WBC 8.9 10/11/2019   HGB 15.3 10/11/2019   HCT 45.2 10/11/2019   MCV 97.3 10/11/2019   PLT 214.0  10/11/2019   No results found for: IRON, TIBC, FERRITIN  Attestation Statements:   Reviewed by clinician on day of visit: allergies, medications, problem list, medical history, surgical history, family history, social history, and previous encounter notes.  Time spent on visit including pre-visit chart review and post-visit care and charting was 20 minutes.    I, Trixie Dredge, am acting as transcriptionist for Dennard Nip, MD.  I have reviewed the above documentation for accuracy and completeness, and I agree with the above. -  Dennard Nip, MD

## 2020-06-25 DIAGNOSIS — M19012 Primary osteoarthritis, left shoulder: Secondary | ICD-10-CM | POA: Insufficient documentation

## 2020-07-14 ENCOUNTER — Other Ambulatory Visit: Payer: Self-pay | Admitting: Family Medicine

## 2020-07-14 ENCOUNTER — Encounter: Payer: Self-pay | Admitting: Family Medicine

## 2020-07-14 ENCOUNTER — Other Ambulatory Visit: Payer: Self-pay | Admitting: Cardiology

## 2020-07-14 MED ORDER — GABAPENTIN 300 MG PO CAPS: 900.0000 mg | ORAL_CAPSULE | Freq: Three times a day (TID) | ORAL | 3 refills | Status: DC

## 2020-07-23 ENCOUNTER — Encounter (INDEPENDENT_AMBULATORY_CARE_PROVIDER_SITE_OTHER): Payer: Self-pay | Admitting: Family Medicine

## 2020-07-23 ENCOUNTER — Other Ambulatory Visit: Payer: Self-pay

## 2020-07-23 ENCOUNTER — Ambulatory Visit (INDEPENDENT_AMBULATORY_CARE_PROVIDER_SITE_OTHER): Payer: Medicare Other | Admitting: Family Medicine

## 2020-07-23 VITALS — BP 122/79 | HR 63 | Temp 97.6°F | Ht 69.0 in | Wt 211.0 lb

## 2020-07-23 DIAGNOSIS — E559 Vitamin D deficiency, unspecified: Secondary | ICD-10-CM | POA: Diagnosis not present

## 2020-07-23 DIAGNOSIS — Z6831 Body mass index (BMI) 31.0-31.9, adult: Secondary | ICD-10-CM | POA: Diagnosis not present

## 2020-07-23 DIAGNOSIS — R739 Hyperglycemia, unspecified: Secondary | ICD-10-CM

## 2020-07-23 DIAGNOSIS — E7849 Other hyperlipidemia: Secondary | ICD-10-CM | POA: Diagnosis not present

## 2020-07-23 DIAGNOSIS — E669 Obesity, unspecified: Secondary | ICD-10-CM | POA: Diagnosis not present

## 2020-07-23 NOTE — Progress Notes (Signed)
Chief Complaint:   OBESITY [name] [name] is here to discuss his progress with his obesity treatment plan along with follow-up of his obesity related diagnoses. [name] [name] is on the Category 3 Plan and states he is following his eating plan approximately 75-85% of the time. [name] [name] states he is doing yard work and shop work.  Today's visit was #: 37 Starting weight: 237 lbs Starting date: 08/18/2017 Today's weight: 211 lbs Today's date: 07/23/2020 Total lbs lost to date: 26 Total lbs lost since last in-office visit: 8  Interim History: [name] [name] has gotten back on track with his eating plan. His hunger is controlled but he is concerned about sodium intake. His blood pressure is well controlled.  Subjective:   1. Vitamin D deficiency [name] [name] is on Vit D, and he is due for labs. He denies nausea, vomiting, or muscle weakness.  2. Other hyperlipidemia [name] [name] is doing very well with diet, exercise, and weight loss. He is due for labs.  3. Hyperglycemia [name] [name] has been very well controlled on his eating plan. He is due for labs.  Assessment/Plan:   1. Vitamin D deficiency Low Vitamin D level contributes to fatigue and are associated with obesity, breast, and colon cancer. We will check labs today. [name] [name] will follow-up for routine testing of Vitamin D, at least 2-3 times per year to avoid over-replacement.  - VITAMIN D 25 Hydroxy (Vit-D Deficiency, Fractures)  2. Other hyperlipidemia Cardiovascular risk and specific lipid/LDL goals reviewed. We discussed several lifestyle modifications today. [name] [name] will continue to work on diet, exercise and weight loss efforts. We will check labs today. Orders and follow up as documented in patient record.   - Comprehensive metabolic panel - Lipid Panel With LDL/HDL Ratio  3. Hyperglycemia Fasting labs will be obtained today, and results with be discussed with [name] [name] in 4 weeks at his follow up visit. In the meanwhile [name] [name] will continue diet, exercise, and will work  on weight loss efforts.  - Comprehensive metabolic panel - Hemoglobin A1c - Insulin, random  4. Class 1 obesity with serious comorbidity and body mass index (BMI) of 31.0 to 31.9 in adult, unspecified obesity type [name] [name] is currently in the action stage of change. As such, his goal is to continue with weight loss efforts. He has agreed to keeping a food journal and adhering to recommended goals of 1500 calories and 85+ grams of protein daily.   Soup recipes were given with low sodium.  Exercise goals: As is.  Behavioral modification strategies: increasing water intake and decreasing sodium intake.  [name] [name] has agreed to follow-up with our clinic in 4 weeks. He was informed of the importance of frequent follow-up visits to maximize his success with intensive lifestyle modifications for his multiple health conditions.   [name] [name] was informed we would discuss his lab results at his next visit unless there is a critical issue that needs to be addressed sooner. [name] [name] agreed to keep his next visit at the agreed upon time to discuss these results.  Objective:   Blood pressure 122/79, pulse 63, temperature 97.6 F (36.4 C), temperature source Oral, height 5\' 9"  (1.753 m), weight (!) 211 lb (95.7 kg), SpO2 99 %. Body mass index is 31.16 kg/m.  General: Cooperative, alert, well developed, in no acute distress. HEENT: Conjunctivae and lids unremarkable. Cardiovascular: Regular rhythm.  Lungs: Normal work of breathing. Neurologic: No focal deficits.   Lab Results  Component Value Date   CREATININE 0.97 04/21/2020   BUN 24 04/21/2020  NA 143 04/21/2020   K 5.0 04/21/2020   CL 106 04/21/2020   CO2 25 04/21/2020   Lab Results  Component Value Date   ALT 19 04/21/2020   AST 22 04/21/2020   ALKPHOS 63 04/21/2020   BILITOT 0.5 04/21/2020   Lab Results  Component Value Date   HGBA1C 5.6 04/21/2020   HGBA1C 5.7 (H) 12/31/2019   HGBA1C 5.5 09/10/2019   HGBA1C 5.8 (H) 12/05/2018   HGBA1C  5.6 08/29/2018   Lab Results  Component Value Date   INSULIN 5.1 04/21/2020   INSULIN 10.5 12/31/2019   INSULIN 27.4 (H) 09/10/2019   INSULIN 6.7 12/05/2018   INSULIN 5.6 08/29/2018   Lab Results  Component Value Date   TSH 1.16 10/11/2019   Lab Results  Component Value Date   CHOL 157 04/21/2020   HDL 64 04/21/2020   LDLCALC 81 04/21/2020   TRIG 60 04/21/2020   CHOLHDL 3 10/11/2019   Lab Results  Component Value Date   WBC 8.9 10/11/2019   HGB 15.3 10/11/2019   HCT 45.2 10/11/2019   MCV 97.3 10/11/2019   PLT 214.0 10/11/2019   No results found for: IRON, TIBC, FERRITIN  Obesity Behavioral Intervention Documentation for Insurance:   Approximately 15 minutes were spent on the discussion below.  ASK: We discussed the diagnosis of obesity with [name] [name] today and [name] [name] agreed to give [name] permission to discuss obesity behavioral modification therapy today.  ASSESS: [name] [name] has the diagnosis of obesity and his BMI today is 31.15. [place] is in the action stage of change.   ADVISE: [name] [name] was educated on the multiple health risks of obesity as well as the benefit of weight loss to improve his health. He was advised of the need for long term treatment and the importance of lifestyle modifications to improve his current health and to decrease his risk of future health problems.  AGREE: Multiple dietary modification options and treatment options were discussed and [name] [name] agreed to follow the recommendations documented in the above note.  ARRANGE: [name] [name] was educated on the importance of frequent visits to treat obesity as outlined per CMS and USPSTF guidelines and agreed to schedule his next follow up appointment today.  Attestation Statements:   Reviewed by clinician on day of visit: allergies, medications, problem list, medical history, surgical history, family history, social history, and previous encounter notes.   I, Trixie Dredge, am acting as transcriptionist for [name] Nip, MD.  I have reviewed the above documentation for accuracy and completeness, and I agree with the above. -  [name] Nip, MD

## 2020-07-24 LAB — COMPREHENSIVE METABOLIC PANEL
ALT: 18 IU/L (ref 0–44)
AST: 19 IU/L (ref 0–40)
Albumin/Globulin Ratio: 1.8 (ref 1.2–2.2)
Albumin: 4.5 g/dL (ref 3.7–4.7)
Alkaline Phosphatase: 63 IU/L (ref 48–121)
BUN/Creatinine Ratio: 21 (ref 10–24)
BUN: 20 mg/dL (ref 8–27)
Bilirubin Total: 1.1 mg/dL (ref 0.0–1.2)
CO2: 25 mmol/L (ref 20–29)
Calcium: 10.8 mg/dL — ABNORMAL HIGH (ref 8.6–10.2)
Chloride: 105 mmol/L (ref 96–106)
Creatinine, Ser: 0.94 mg/dL (ref 0.76–1.27)
GFR calc Af Amer: 90 mL/min/{1.73_m2} (ref >59)
GFR calc non Af Amer: 78 mL/min/{1.73_m2} (ref >59)
Globulin, Total: 2.5 g/dL (ref 1.5–4.5)
Glucose: 84 mg/dL (ref 65–99)
Potassium: 4.8 mmol/L (ref 3.5–5.2)
Sodium: 143 mmol/L (ref 134–144)
Total Protein: 7 g/dL (ref 6.0–8.5)

## 2020-07-24 LAB — LIPID PANEL WITH LDL/HDL RATIO
Cholesterol, Total: 151 mg/dL (ref 100–199)
HDL: 54 mg/dL (ref >39)
LDL Chol Calc (NIH): 81 mg/dL (ref 0–99)
LDL/HDL Ratio: 1.5 ratio (ref 0.0–3.6)
Triglycerides: 82 mg/dL (ref 0–149)
VLDL Cholesterol Cal: 16 mg/dL (ref 5–40)

## 2020-07-24 LAB — VITAMIN D 25 HYDROXY (VIT D DEFICIENCY, FRACTURES): Vit D, 25-Hydroxy: 29.6 ng/mL — ABNORMAL LOW (ref 30.0–100.0)

## 2020-07-24 LAB — HEMOGLOBIN A1C
Est. average glucose Bld gHb Est-mCnc: 111 mg/dL
Hgb A1c MFr Bld: 5.5 % (ref 4.8–5.6)

## 2020-07-24 LAB — INSULIN, RANDOM: INSULIN: 5.2 u[IU]/mL (ref 2.6–24.9)

## 2020-08-01 NOTE — Progress Notes (Signed)
I connected with [name] [name] today by telephone and verified that I am speaking with the correct person using two identifiers. Location patient: home Location provider: work Persons participating in the virtual visit: patient, Marine scientist.    I discussed the limitations, risks, security and privacy concerns of performing an evaluation and management service by telephone and the availability of in person appointments. I also discussed with the patient that there may be a patient responsible charge related to this service. The patient expressed understanding and verbally consented to this telephonic visit.    Interactive audio and video telecommunications were attempted between this provider and patient, however failed, due to patient having technical difficulties OR patient did not have access to video capability.  We continued and completed visit with audio only.  Some vital signs may be absent or patient reported.    Subjective:   [name] [name] is a 79 y.o. male who presents for Medicare Annual/Subsequent preventive examination.  Review of Systems    Cardiac Risk Factors include: advanced age (>40men, >93 women);male gender     Objective:     Advanced Directives 08/04/2020 10/05/2019 06/01/2019 06/19/2016 03/30/2016 09/24/2015 04/10/2015  Does Patient Have a Medical Advance Directive? Yes Yes Yes Yes Yes Yes Yes  Type of Paramedic of [place];Living will - [place];Living will [place];Living will [place];Living will Living will [place];Living will  Does patient want to make changes to medical advance directive? No - Patient declined No - Patient declined No - Guardian declined - No - Patient declined - -  Copy of [place] in Chart? Yes - validated most recent copy scanned in chart (See row information) - No - copy requested No - copy requested No - copy requested Yes No - copy  requested  Would patient like information on creating a medical advance directive? - - No - Patient declined - - - -  Pre-existing out of facility DNR order (yellow form or pink MOST form) - - - - - - -    Current Medications (verified) Outpatient Encounter Medications as of 08/04/2020  Medication Sig   Cholecalciferol (VITAMIN D3) 5000 units CAPS Take 1 capsule by mouth daily.    ELIQUIS 5 MG TABS tablet Take 1 tablet by mouth twice daily   gabapentin (NEURONTIN) 300 MG capsule Take 3 capsules (900 mg total) by mouth 3 (three) times daily.   Multiple Vitamin (MULTIVITAMIN WITH MINERALS) TABS tablet Take 1 tablet by mouth daily.    No facility-administered encounter medications on file as of 08/04/2020.    Allergies (verified) Patient has no known allergies.   History: Past Medical History:  Diagnosis Date   Anxiety    Atrial fibrillation (HCC)    BPH (benign prostatic hypertrophy)    Bradycardia 12/30/2014   HR routinely in mid 40s-50s   Coronary artery disease (CAD) excluded April 2016   False-positive nuclear stress test suggesting inferior ischemia   Elevated PSA    Encounter for Medicare annual wellness exam 09/22/2013   Sees Dr Wilhemina Bonito of Derm Sees Dr Silvano Rusk of Gastroenterology Sees Dr Kathie Rhodes of [facility] Sees Dr Susa Day of Optometry   GERD (gastroesophageal reflux disease)    controlled w/ diet and behavioral changes, history of   Grade I diastolic dysfunction 6063   History of kidney stones    Hypertriglyceridemia 03/16/2015   Lymphadenitis    Melanoma of back ([place])  Excised Dr Wilhemina Bonito   Mild ascending aorta dilatation [facility])    New onset a-fib ([place]) 03/10/2015   OA (osteoarthritis)    Otitis, externa, infective 08/10/2015   Palpitations    Post herpetic neuralgia    Shingles 07/09/2013   Sleep apnea 07/30/2011   cpap- 13    Snoring disorder 07/30/2011   Past Surgical History:  Procedure Laterality Date    CATARACT EXTRACTION Right    COLONOSCOPY  2018   CYSTOSCOPY/URETEROSCOPY/HOLMIUM LASER/STENT PLACEMENT Left 10/05/2019   Procedure: CYSTOSCOPY/RETROGRADE/URETEROSCOPY/HOLMIUM LASER/STENT PLACEMENT;  Surgeon: Kathie Rhodes, MD;  Location: [facility];  Service: Urology;  Laterality: Left;   EYE SURGERY Left 12/06/2017   cataract by Dr Logan Bores HERNIA REPAIR Bilateral 06/01/2019   Procedure: LAPAROSCOPIC LEFT AND  RIGHT INGUINAL HERNIA REPAIR WITH MESH;  Surgeon: Michael Boston, MD;  Location: [place];  Service: General;  Laterality: Bilateral;   LEFT HEART CATHETERIZATION WITH CORONARY ANGIOGRAM N/A 04/10/2015   Procedure: LEFT HEART CATHETERIZATION WITH  CORONARY ANGIOGRAM;  Surgeon: Leonie Man, MD;  Location: [facility] C[facility] LAB;  Service: Cardiovascular;  Angiographicallly NORMAL CORONARY ARTERIES   MASS EXCISION N/A 05/16/2014   Procedure: EXCISION POSTERIOR NECK MASS, RIGHT CHEST WALL MASS AND RIGHT AXILLARY MASS AXILLARY NODE DISSECTION;  Surgeon: Adin Hector, MD;  Location: [facility];  Service: General;  Laterality: N/A;   NM MYOVIEW LTD  03/19/2015   FALSE POSITIVE:  INTERMEDIATE RISK. Small sized, moderate intensity inferior ischemic perfusion defect   TRANSTHORACIC ECHOCARDIOGRAM  03/20/2015   Normal LV size and function. EF 60-65%. G1 DD. Trivial AI. Borderline aortic root dilation (41 mm), Mild LA dilation.   Family History  Problem Relation Age of Onset   Cancer Mother 74       MM, leukemia   Obesity Mother    Emphysema Father 29   Obesity Father    Alcohol abuse Father    Cancer Sister 65       brain   COPD Brother 13   Heart disease Paternal Grandmother    COPD Paternal Grandfather    Diabetes Sister 83   Obesity Sister    Down syndrome Brother 54       aspirated   Alcohol abuse Other    Colon cancer Neg Hx    Social History   Socioeconomic History   Marital status: Married    Spouse name: [name]    Number of children: 2   Years of education: Not on file   Highest education level: Not on file  Occupational History   Occupation: retired    Comment: truck driver  Tobacco Use   Smoking status: Former Smoker    Years: 27.00    Types: Pipe, Cigars    Quit date: 02/11/1989    Years since quitting: 31.4   Smokeless tobacco: Never Used  Vaping Use   Vaping Use: Never used  Substance and Sexual Activity   Alcohol use: Yes    Alcohol/week: 7.0 standard drinks    Types: 7 Glasses of wine per week    Comment: 1 per day -- brandy or wine   Drug use: No   Sexual activity: Yes    Comment: lives with wife, retired from [employer] driving, no dietary restrictions  Other Topics Concern   Not on file  Social History Narrative   He is a married father of 2, and grandfather of 30. He has been married to his wife [name] for 51 years.  He previously lived in [place] but has moved with his wife to [place], [place] to be close to his daughter [name] and her family. [name] is our Nurse, adult.   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 He quit smoking in 1990. He has 6-7 glasses of brandy or wine a week.    He exercises regularly with low impact aerobics 2 days a week and daily walks of 30-45 minutes. His exercise sessions or 60 minutes. He will do some type of exercise at least 7 days a week and is very active doing yard work and wood work.      Patient is right-handed. He lives with his wife in a one level home with a basement. He drinks 4-5 cups of decaf coffee a day. He and his wife go to the gym 1-2 x a week.   Social Determinants of Health   Financial Resource Strain: Low Risk    Difficulty of Paying Living Expenses: Not hard at all  Food Insecurity: No Food Insecurity   Worried About Charity fundraiser in the Last Year: Never true   [place] in the Last Year: Never true  Transportation Needs: No Transportation Needs   Lack  of Transportation (Medical): No   Lack of Transportation (Non-Medical): No  Physical Activity:    Days of Exercise per Week:    Minutes of Exercise per Session:   Stress:    Feeling of Stress :   Social Connections:    Frequency of Communication with Friends and Family:    Frequency of Social Gatherings with Friends and Family:    Attends Religious Services:    Active Member of Clubs or Organizations:    Attends Archivist Meetings:    Marital Status:     Tobacco Counseling Counseling given: Not Answered   Clinical Intake:     Pain : No/denies pain                  Activities of Daily Living In your present state of health, do you have any difficulty performing the following activities: 08/04/2020 08/04/2020  Hearing? Tempie Donning  Vision? N -  Difficulty concentrating or making decisions? N -  Walking or climbing stairs? N -  Dressing or bathing? N -  Doing errands, shopping? N -  Preparing Food and eating ? N -  Using the Toilet? N -  In the past six months, have you accidently leaked urine? N -  Do you have problems with loss of bowel control? N -  Managing your Medications? N -  Managing your Finances? N -  Housekeeping or managing your Housekeeping? N -  Some recent data might be hidden    Patient Care Team: Mosie Lukes, MD as PCP - General (Family Medicine) Leonie Man, MD as PCP - Cardiology (Cardiology) Gatha Mayer, MD as Consulting Physician (Gastroenterology) Danella Sensing, MD as Consulting Physician (Dermatology) Kathie Rhodes, MD as Consulting Physician (Urology) Leonie Man, MD as Consulting Physician (Cardiology) Lynda Rainwater, DDS as Consulting Physician (Dentistry) Michael Boston, MD as Consulting Physician (General Surgery)  Indicate any recent Medical Services you may have received from other than [facility] providers in the past year (date may be approximate).     Assessment:   This is a routine wellness  examination for [place].  Dietary issues and exercise activities discussed: Current Exercise Habits: The patient does not participate in  regular exercise at present, Exercise limited by: None identified Diet (meal preparation, eat out, water intake, caffeinated beverages, dairy products, fruits and vegetables): well balanced     Goals      Lose 10lbs by this time next year.   (pt-stated)      Depression Screen PHQ 2/9 Scores 08/04/2020 08/18/2017 06/09/2017 03/30/2016 03/10/2015 03/10/2015 12/24/2014  PHQ - 2 Score 0 1 0 0 0 0 0  PHQ- 9 Score - 7 - - 0 - -    Fall Risk Fall Risk  08/04/2020 12/14/2018 06/09/2017 03/30/2016 03/10/2015  Falls in the past year? 0 0 No No No  Number falls in past yr: 0 - - - -  Injury with Fall? 0 - - - -  Follow up Education provided;Falls prevention discussed Falls evaluation completed - - -    Any stairs in or around the home? Yes  If so, are there any without handrails? No  Home free of loose throw rugs in walkways, pet beds, electrical cords, etc? Yes  Adequate lighting in your home to reduce risk of falls? Yes   ASSISTIVE DEVICES UTILIZED TO PREVENT FALLS:  Life alert? No  Use of a cane, walker or w/c? No  Grab bars in the bathroom? No  Shower chair or bench in shower? No  Elevated toilet seat or a handicapped toilet? No     Cognitive Function:  MMSE - Mini Mental State Exam 03/30/2016  Orientation to time 5  Orientation to Place 5  Registration 3  Attention/ Calculation 5  Recall 2  Language- name 2 objects 2  Language- repeat 1  Language- follow 3 step command 3  Language- read & follow direction 1  Write a sentence 1  Copy design 1  Total score 29     6CIT Screen 08/04/2020  What Year? 0 points  What month? 0 points  What time? 0 points  Count back from 20 0 points  Months in reverse 0 points  Repeat phrase 0 points  Total Score 0    Immunizations Immunization History  Administered Date(s) Administered   Fluad Quad(high Dose  65+) 10/11/2019   Influenza Whole 09/15/2009, 09/23/2010   Influenza, High Dose Seasonal PF 09/21/2016, 11/07/2017, 10/06/2018   Influenza,inj,Quad PF,6+ Mos 09/20/2013, 10/18/2014, 09/08/2015   PFIZER SARS-COV-2 Vaccination 01/11/2020, 02/01/2020   Pneumococcal Conjugate-13 11/19/2013   Pneumococcal Polysaccharide-23 04/09/2003, 10/31/2015   Td 06/04/2010   Zoster Recombinat (Shingrix) 09/30/2017, 12/08/2017    TDAP status: Due, Education has been provided regarding the importance of this vaccine. Advised may receive this vaccine at local pharmacy or Health Dept. Aware to provide a copy of the vaccination record if obtained from local pharmacy or Health Dept. Verbalized acceptance and understanding. Flu Vaccine status: Up to date Pneumococcal vaccine status: Up to date Covid-19 vaccine status: Completed vaccines  Qualifies for Shingles Vaccine? Yes   Shingrix Completed?: Yes  Screening Tests Health Maintenance  Topic Date Due   Hepatitis C Screening  Never done   TETANUS/TDAP  06/04/2020   INFLUENZA VACCINE  07/27/2020   COVID-19 Vaccine  Completed   PNA vac Low Risk Adult  Completed    Health Maintenance  Health Maintenance Due  Topic Date Due   Hepatitis C Screening  Never done   TETANUS/TDAP  06/04/2020   INFLUENZA VACCINE  07/27/2020    Colorectal cancer screening: Completed 12/23/13. Repeat every 10 years  Lung Cancer Screening: (Low Dose CT Chest recommended if Age 41-80 years, 30 pack-year currently smoking  OR have quit w/in 15years.) does not qualify.    Additional Screening:  Vision Screening: Recommended annual ophthalmology exams for early detection of glaucoma and other disorders of the eye. Is the patient up to date with their annual eye exam?  Yes  Who is the provider or what is the name of the office in which the patient attends annual eye exams? Dr.Lyles  Dental Screening: Recommended annual dental exams for proper oral  hygiene  Community Resource Referral / Chronic Care Management: CRR required this visit?  No   CCM required this visit?  No      Plan:     Please schedule your next medicare wellness visit with me in 1 yr.  Continue to eat heart healthy diet (full of fruits, vegetables, whole grains, lean protein, water--limit salt, fat, and sugar intake) and increase physical activity as tolerated.  Continue doing brain stimulating activities (puzzles, reading, adult coloring books, staying active) to keep memory sharp.    I have personally reviewed and noted the following in the patients chart:    Medical and social history  Use of alcohol, tobacco or illicit drugs   Current medications and supplements  Functional ability and status  Nutritional status  Physical activity  Advanced directives  List of other physicians  Hospitalizations, surgeries, and ER visits in previous 12 months  Vitals  Screenings to include cognitive, depression, and falls  Referrals and appointments  In addition, I have reviewed and discussed with patient certain preventive protocols, quality metrics, and best practice recommendations. A written personalized care plan for preventive services as well as general preventive health recommendations were provided to patient.   Due to this being a telephonic visit, the after visit summary with patients personalized plan was offered to patient via mail or my-chart.  Patient would like to access on my-chart.  Shela Nevin, [place]   08/04/2020   Nurse Notes: Enjoys woodworking.

## 2020-08-04 ENCOUNTER — Other Ambulatory Visit: Payer: Self-pay

## 2020-08-04 ENCOUNTER — Encounter: Payer: Self-pay | Admitting: *Deleted

## 2020-08-04 ENCOUNTER — Ambulatory Visit (INDEPENDENT_AMBULATORY_CARE_PROVIDER_SITE_OTHER): Payer: Medicare Other | Admitting: *Deleted

## 2020-08-04 DIAGNOSIS — Z0000 Encounter for general adult medical examination without abnormal findings: Secondary | ICD-10-CM

## 2020-08-04 NOTE — Patient Instructions (Signed)
Please schedule your next medicare wellness visit with me in 1 yr.  Continue to eat heart healthy diet (full of fruits, vegetables, whole grains, lean protein, water--limit salt, fat, and sugar intake) and increase physical activity as tolerated.  Continue doing brain stimulating activities (puzzles, reading, adult coloring books, staying active) to keep memory sharp.    Mr. Mcclean , Thank you for taking time to come for your Medicare Wellness Visit. I appreciate your ongoing commitment to your health goals. Please review the following plan we discussed and let me know if I can assist you in the future.   These are the goals we discussed: Goals    .  Lose 10lbs by this time next year.   (pt-stated)    .  Maintain healthy lifestyle.       This is a list of the screening recommended for you and due dates:  Health Maintenance  Topic Date Due  .  Hepatitis C: One time screening is recommended by Center for Disease Control  (CDC) for  adults born from [birth year] through 1963.   Never done  . Tetanus Vaccine  06/04/2020  . Flu Shot  07/27/2020  . COVID-19 Vaccine  Completed  . Pneumonia vaccines  Completed    Preventive Care 66 Years and Older, Male Preventive care refers to lifestyle choices and visits with your health care provider that can promote health and wellness. This includes:  A yearly physical exam. This is also called an annual well check.  Regular dental and eye exams.  Immunizations.  Screening for certain conditions.  Healthy lifestyle choices, such as diet and exercise. What can I expect for my preventive care visit? Physical exam Your health care provider will check:  Height and weight. These may be used to calculate body mass index (BMI), which is a measurement that tells if you are at a healthy weight.  Heart rate and blood pressure.  Your skin for abnormal spots. Counseling Your health care provider may ask you questions about:  Alcohol, tobacco, and drug  use.  Emotional well-being.  Home and relationship well-being.  Sexual activity.  Eating habits.  History of falls.  Memory and ability to understand (cognition).  Work and work Statistician. What immunizations do I need?  Influenza (flu) vaccine  This is recommended every year. Tetanus, diphtheria, and pertussis (Tdap) vaccine  You may need a Td booster every 10 years. Varicella (chickenpox) vaccine  You may need this vaccine if you have not already been vaccinated. Zoster (shingles) vaccine  You may need this after age 63. Pneumococcal conjugate (PCV13) vaccine  One dose is recommended after age 73. Pneumococcal polysaccharide (PPSV23) vaccine  One dose is recommended after age 70. Measles, mumps, and rubella (MMR) vaccine  You may need at least one dose of MMR if you were born in 1957 or later. You may also need a second dose. Meningococcal conjugate (MenACWY) vaccine  You may need this if you have certain conditions. Hepatitis A vaccine  You may need this if you have certain conditions or if you travel or work in places where you may be exposed to hepatitis A. Hepatitis B vaccine  You may need this if you have certain conditions or if you travel or work in places where you may be exposed to hepatitis B. Haemophilus influenzae type b (Hib) vaccine  You may need this if you have certain conditions. You may receive vaccines as individual doses or as more than one vaccine together in one shot (  combination vaccines). Talk with your health care provider about the risks and benefits of combination vaccines. What tests do I need? Blood tests  Lipid and cholesterol levels. These may be checked every 5 years, or more frequently depending on your overall health.  Hepatitis C test.  Hepatitis B test. Screening  Lung cancer screening. You may have this screening every year starting at age 38 if you have a 30-pack-year history of smoking and currently smoke or have  quit within the past 15 years.  Colorectal cancer screening. All adults should have this screening starting at age 15 and continuing until age 73. Your health care provider may recommend screening at age 38 if you are at increased risk. You will have tests every 1-10 years, depending on your results and the type of screening test.  Prostate cancer screening. Recommendations will vary depending on your family history and other risks.  Diabetes screening. This is done by checking your blood sugar (glucose) after you have not eaten for a while (fasting). You may have this done every 1-3 years.  Abdominal aortic aneurysm (AAA) screening. You may need this if you are a current or former smoker.  Sexually transmitted disease (STD) testing. Follow these instructions at home: Eating and drinking  Eat a diet that includes fresh fruits and vegetables, whole grains, lean protein, and low-fat dairy products. Limit your intake of foods with high amounts of sugar, saturated fats, and salt.  Take vitamin and mineral supplements as recommended by your health care provider.  Do not drink alcohol if your health care provider tells you not to drink.  If you drink alcohol: ? Limit how much you have to 0-2 drinks a day. ? Be aware of how much alcohol is in your drink. In the [place] one drink equals one 12 oz bottle of beer (355 mL), one 5 oz glass of wine (148 mL), or one 1 oz glass of hard liquor (44 mL). Lifestyle  Take daily care of your teeth and gums.  Stay active. Exercise for at least 30 minutes on 5 or more days each week.  Do not use any products that contain nicotine or tobacco, such as cigarettes, e-cigarettes, and chewing tobacco. If you need help quitting, ask your health care provider.  If you are sexually active, practice safe sex. Use a condom or other form of protection to prevent STIs (sexually transmitted infections).  Talk with your health care provider about taking a low-dose aspirin  or statin. What's next?  Visit your health care provider once a year for a well check visit.  Ask your health care provider how often you should have your eyes and teeth checked.  Stay up to date on all vaccines. This information is not intended to replace advice given to you by your health care provider. Make sure you discuss any questions you have with your health care provider. Document Revised: 12/07/2018 Document Reviewed: 12/07/2018 Elsevier Patient Education  2020 [employer].

## 2020-08-07 ENCOUNTER — Telehealth: Payer: Self-pay

## 2020-08-07 NOTE — Telephone Encounter (Signed)
   [facility] Pre-operative Risk Assessment    HEARTCARE STAFF: - Please ensure there is not already an duplicate clearance open for this procedure. - Under Visit Info/Reason for Call, type in Other and utilize the format Clearance MM/DD/YY or Clearance TBD. Do not use dashes or single digits. - If request is for dental extraction, please clarify the # of teeth to be extracted.  Request for surgical clearance:  1. What type of surgery is being performed? Lt Shoulder Reverse Arthroplasty  2. When is this surgery scheduled? 10/16/20  3. What type of clearance is required (medical clearance vs. Pharmacy clearance to hold med vs. Both) Both  4. Are there any medications that need to be held prior to surgery and how long? Eliquis  5. Practice name and name of physician performing surgery? Dr. Justice Britain- [facility]  6. What is the office phone number? [phone]   7.   What is the office fax number? [phone]  8.   Anesthesia type (None, local, MAC, general) ? general   [name] [name] [name] [name] 08/07/2020, 8:59 AM  _________________________________________________________________   (provider comments below)

## 2020-08-07 NOTE — Telephone Encounter (Signed)
Dr. Onnie Graham this patient has an appointment with Dr. Ellyn Hack on October 14.  His surgery is scheduled for October 21st.  Preop clearance and anticoagulation will be addressed at the October 14th visit with Dr Ellyn Hack.  Please call us if there are any questions.  Kerin Ransom PA-C 08/07/2020 9:24 AM

## 2020-08-07 NOTE — Telephone Encounter (Signed)
The patient surgery is not until October 21.  He has an appointment with Dr. Ellyn Hack on October 14.  Clearance and anticoagulation can be addressed at that visit.  I will send a note to the operating surgeon and remove this request from the preop pool.  Kerin Ransom PA-C 08/07/2020 9:21 AM

## 2020-08-08 NOTE — Telephone Encounter (Signed)
Thanks [employer].  He is probably okay to plan to have the surgery on the 21st and we will hold his DOAC based upon when I see him.  Glenetta Hew, MD

## 2020-08-27 ENCOUNTER — Encounter (INDEPENDENT_AMBULATORY_CARE_PROVIDER_SITE_OTHER): Payer: Self-pay | Admitting: Family Medicine

## 2020-08-27 ENCOUNTER — Ambulatory Visit (INDEPENDENT_AMBULATORY_CARE_PROVIDER_SITE_OTHER): Payer: Medicare Other | Admitting: Family Medicine

## 2020-08-27 ENCOUNTER — Other Ambulatory Visit: Payer: Self-pay

## 2020-08-27 VITALS — BP 108/70 | HR 71 | Temp 97.6°F | Ht 69.0 in | Wt 211.0 lb

## 2020-08-27 DIAGNOSIS — E559 Vitamin D deficiency, unspecified: Secondary | ICD-10-CM | POA: Diagnosis not present

## 2020-08-27 DIAGNOSIS — E669 Obesity, unspecified: Secondary | ICD-10-CM

## 2020-08-27 DIAGNOSIS — Z6831 Body mass index (BMI) 31.0-31.9, adult: Secondary | ICD-10-CM

## 2020-08-27 MED ORDER — VITAMIN D3 125 MCG (5000 UT) PO CAPS: 1.0000 | ORAL_CAPSULE | Freq: Every day | ORAL | 0 refills | Status: AC

## 2020-08-27 NOTE — Progress Notes (Signed)
Chief Complaint:   OBESITY [name] [name] is here to discuss his progress with his obesity treatment plan along with follow-up of his obesity related diagnoses. [name] [name] is on keeping a food journal and adhering to recommended goals of 1500 calories and 85+ grams of protein daily and states he is following his eating plan approximately 75-80% of the time. [name] [name] states he is consistently moving all day 7 times per week.  Today's visit was #: 31 Starting weight: 237 lbs Starting date: 08/18/2017 Today's weight: 211 lbs Today's date: 08/27/2020 Total lbs lost to date: 26 Total lbs lost since last in-office visit: 0  Interim History: [name] [name] continues to do well maintaining his weight loss. He plans to go on vacation to the [facility] soon.  Subjective:   1. Vitamin D deficiency [name] [name]'s Vit D level worsened and had dropped below goal.  Assessment/Plan:   1. Vitamin D deficiency Low Vitamin D level contributes to fatigue and are associated with obesity, breast, and colon cancer. We will refill prescription Vitamin D for 1 month. [name] [name] will follow-up for routine testing of Vitamin D, at least 2-3 times per year to avoid over-replacement. We will recheck labs in 3 months.  - Cholecalciferol (VITAMIN D3) 125 MCG (5000 UT) CAPS; Take 1 capsule (5,000 Units total) by mouth daily.  Dispense: 30 capsule; Refill: 0  2. Class 1 obesity with serious comorbidity and body mass index (BMI) of 31.0 to 31.9 in adult, unspecified obesity type [name] [name] is currently in the action stage of change. As such, his goal is to continue with weight loss efforts. He has agreed to the Category 3 Plan.   Exercise goals: As is.  Behavioral modification strategies: travel eating strategies.  [name] [name] has agreed to follow-up with our clinic in 6 weeks. He was informed of the importance of frequent follow-up visits to maximize his success with intensive lifestyle modifications for his multiple health conditions.   Objective:    Blood pressure 108/70, pulse 71, temperature 97.6 F (36.4 C), height 5\' 9"  (1.753 m), weight 211 lb (95.7 kg), SpO2 98 %. Body mass index is 31.16 kg/m.  General: Cooperative, alert, well developed, in no acute distress. HEENT: Conjunctivae and lids unremarkable. Cardiovascular: Regular rhythm.  Lungs: Normal work of breathing. Neurologic: No focal deficits.   Lab Results  Component Value Date   CREATININE 0.94 07/23/2020   BUN 20 07/23/2020   NA 143 07/23/2020   K 4.8 07/23/2020   CL 105 07/23/2020   CO2 25 07/23/2020   Lab Results  Component Value Date   ALT 18 07/23/2020   AST 19 07/23/2020   ALKPHOS 63 07/23/2020   BILITOT 1.1 07/23/2020   Lab Results  Component Value Date   HGBA1C 5.5 07/23/2020   HGBA1C 5.6 04/21/2020   HGBA1C 5.7 (H) 12/31/2019   HGBA1C 5.5 09/10/2019   HGBA1C 5.8 (H) 12/05/2018   Lab Results  Component Value Date   INSULIN 5.2 07/23/2020   INSULIN 5.1 04/21/2020   INSULIN 10.5 12/31/2019   INSULIN 27.4 (H) 09/10/2019   INSULIN 6.7 12/05/2018   Lab Results  Component Value Date   TSH 1.16 10/11/2019   Lab Results  Component Value Date   CHOL 151 07/23/2020   HDL 54 07/23/2020   LDLCALC 81 07/23/2020   TRIG 82 07/23/2020   CHOLHDL 3 10/11/2019   Lab Results  Component Value Date   WBC 8.9 10/11/2019   HGB 15.3 10/11/2019   HCT 45.2 10/11/2019   MCV  97.3 10/11/2019   PLT 214.0 10/11/2019   No results found for: IRON, TIBC, FERRITIN  Obesity Behavioral Intervention Documentation for Insurance:   Approximately 15 minutes were spent on the discussion below.  ASK: We discussed the diagnosis of obesity with [name] [name] today and [name] [name] agreed to give [name] permission to discuss obesity behavioral modification therapy today.  ASSESS: [name] [name] has the diagnosis of obesity and his BMI today is 31.15. [name] [name] is in the action stage of change.   ADVISE: [name] [name] was educated on the multiple health risks of obesity as well as the benefit of  weight loss to improve his health. He was advised of the need for long term treatment and the importance of lifestyle modifications to improve his current health and to decrease his risk of future health problems.  AGREE: Multiple dietary modification options and treatment options were discussed and [name] [name] agreed to follow the recommendations documented in the above note.  ARRANGE: [name] [name] was educated on the importance of frequent visits to treat obesity as outlined per CMS and USPSTF guidelines and agreed to schedule his next follow up appointment today.  Attestation Statements:   Reviewed by clinician on day of visit: allergies, medications, problem list, medical history, surgical history, family history, social history, and previous encounter notes.   I, Trixie Dredge, am acting as transcriptionist for Dennard Nip, MD.  I have reviewed the above documentation for accuracy and completeness, and I agree with the above. -  Dennard Nip, MD

## 2020-09-11 ENCOUNTER — Ambulatory Visit (INDEPENDENT_AMBULATORY_CARE_PROVIDER_SITE_OTHER): Payer: Medicare Other | Admitting: Family Medicine

## 2020-09-11 ENCOUNTER — Other Ambulatory Visit: Payer: Self-pay

## 2020-09-11 VITALS — BP 110/74 | HR 78 | Temp 97.4°F | Resp 13 | Ht 69.0 in | Wt 215.8 lb

## 2020-09-11 DIAGNOSIS — B0229 Other postherpetic nervous system involvement: Secondary | ICD-10-CM

## 2020-09-11 DIAGNOSIS — Z23 Encounter for immunization: Secondary | ICD-10-CM

## 2020-09-11 DIAGNOSIS — H9313 Tinnitus, bilateral: Secondary | ICD-10-CM

## 2020-09-11 DIAGNOSIS — R252 Cramp and spasm: Secondary | ICD-10-CM

## 2020-09-11 DIAGNOSIS — G4733 Obstructive sleep apnea (adult) (pediatric): Secondary | ICD-10-CM

## 2020-09-11 DIAGNOSIS — E781 Pure hyperglyceridemia: Secondary | ICD-10-CM

## 2020-09-11 DIAGNOSIS — Z9989 Dependence on other enabling machines and devices: Secondary | ICD-10-CM

## 2020-09-11 DIAGNOSIS — R001 Bradycardia, unspecified: Secondary | ICD-10-CM

## 2020-09-11 DIAGNOSIS — H9193 Unspecified hearing loss, bilateral: Secondary | ICD-10-CM | POA: Insufficient documentation

## 2020-09-11 DIAGNOSIS — E559 Vitamin D deficiency, unspecified: Secondary | ICD-10-CM | POA: Diagnosis not present

## 2020-09-11 DIAGNOSIS — R739 Hyperglycemia, unspecified: Secondary | ICD-10-CM

## 2020-09-11 NOTE — Assessment & Plan Note (Signed)
Using CPAP daily

## 2020-09-11 NOTE — Assessment & Plan Note (Signed)
supplment and monitor 

## 2020-09-11 NOTE — Progress Notes (Signed)
Subjective:    Patient ID: [name] [name], male    DOB: 05-09-1941, 79 y.o.   MRN: [id number]  Chief Complaint  Patient presents with  . follow up 6 months    HPI Patient is in today for follow up on chronic medical concerns. Overall he is doing well. No recent febrile illness or hospitalizations. He continues to have trouble with right posterior shoulder pain from his post herpetic neuralgia. Yesterday was a bad day but when he lies down it gets better. CBD spray also helps some as well. He notes hearing loss is worsening and he is ready. His tinnitus bilateral comes and goes. Denies CP/palp/SOB/HA/congestion/fevers/GI or GU c/o. Taking meds as prescribed  Past Medical History:  Diagnosis Date  . Anxiety   . Atrial fibrillation ([place])   . BPH (benign prostatic hypertrophy)   . Bradycardia 12/30/2014   HR routinely in mid 40s-50s  . Coronary artery disease (CAD) excluded April 2016   False-positive nuclear stress test suggesting inferior ischemia  . Elevated PSA   . Encounter for Medicare annual wellness exam 09/22/2013   Sees Dr Wilhemina Bonito of Derm Sees Dr Silvano Rusk of Gastroenterology Sees Dr Kathie Rhodes of [facility] Sees Dr Susa Day of Optometry  . GERD (gastroesophageal reflux disease)    controlled w/ diet and behavioral changes, history of  . Grade I diastolic dysfunction 6761  . History of kidney stones   . Hypertriglyceridemia 03/16/2015  . Lymphadenitis   . Melanoma of back ([place])    Excised Dr Wilhemina Bonito  . Mild ascending aorta dilatation ([facility])   . New onset a-fib ([place]) 03/10/2015  . OA (osteoarthritis)   . Otitis, externa, infective 08/10/2015  . Palpitations   . Post herpetic neuralgia   . Shingles 07/09/2013  . Sleep apnea 07/30/2011   cpap- 13   . Snoring disorder 07/30/2011    Past Surgical History:  Procedure Laterality Date  . CATARACT EXTRACTION Right   . COLONOSCOPY  2018  . CYSTOSCOPY/URETEROSCOPY/HOLMIUM LASER/STENT PLACEMENT Left  10/05/2019   Procedure: CYSTOSCOPY/RETROGRADE/URETEROSCOPY/HOLMIUM LASER/STENT PLACEMENT;  Surgeon: Kathie Rhodes, MD;  [facility];  Service: Urology;  Laterality: Left;  . EYE SURGERY Left 12/06/2017   cataract by Dr Gillian Scarce  . INGUINAL HERNIA REPAIR Bilateral 06/01/2019   Procedure: LAPAROSCOPIC LEFT AND  RIGHT INGUINAL HERNIA REPAIR WITH MESH;  Surgeon: Michael Boston, MD;  Location: [facility];  Service: General;  Laterality: Bilateral;  . LEFT HEART CATHETERIZATION WITH CORONARY ANGIOGRAM N/A 04/10/2015   Procedure: LEFT HEART CATHETERIZATION WITH  CORONARY ANGIOGRAM;  Surgeon: Leonie Man, MD;  Location: [facility] CATH LAB;  Service: Cardiovascular;  Angiographicallly NORMAL CORONARY ARTERIES  . MASS EXCISION N/A 05/16/2014   Procedure: EXCISION POSTERIOR NECK MASS, RIGHT CHEST WALL MASS AND RIGHT AXILLARY MASS AXILLARY NODE DISSECTION;  Surgeon: Adin Hector, MD;  Location: [facility];  Service: General;  Laterality: N/A;  . NM MYOVIEW LTD  03/19/2015   FALSE POSITIVE:  INTERMEDIATE RISK. Small sized, moderate intensity inferior ischemic perfusion defect  . TRANSTHORACIC ECHOCARDIOGRAM  03/20/2015   Normal LV size and function. EF 60-65%. G1 DD. Trivial AI. Borderline aortic root dilation (41 mm), Mild LA dilation.    Family History  Problem Relation Age of Onset  . Cancer Mother 24       MM, leukemia  . Obesity Mother   . Emphysema Father 38  . Obesity Father   . Alcohol abuse Father   . Cancer  Sister 64       brain  . COPD Brother 62  . Heart disease Paternal Grandmother   . COPD Paternal Grandfather   . Diabetes Sister 16  . Obesity Sister   . Down syndrome Brother 21       aspirated  . Alcohol abuse Other   . Colon cancer Neg Hx     Social History   Socioeconomic History  . Marital status: Married    Spouse name: [name]  . Number of children: 2  . Years of education: Not on file  . Highest education level: Not on file    Occupational History  . Occupation: retired    Comment: truck Geophysicist/field seismologist  Tobacco Use  . Smoking status: Former Smoker    Years: 27.00    Types: Pipe, Cigars    Quit date: 02/11/1989    Years since quitting: 31.6  . Smokeless tobacco: Never Used  Vaping Use  . Vaping Use: Never used  Substance and Sexual Activity  . Alcohol use: Yes    Alcohol/week: 7.0 standard drinks    Types: 7 Glasses of wine per week    Comment: 1 per day -- brandy or wine  . Drug use: No  . Sexual activity: Yes    Comment: lives with wife, retired from [employer] driving, no dietary restrictions  Other Topics Concern  . Not on file  Social History Narrative   He is a married father of 2, and grandfather of 23. He has been married to his wife [name] for 51 years. He previously lived in [place] but has moved with his wife to [place], [place] to be close to his daughter [name] and her family. [name] is our Nurse, adult.   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 He quit smoking in 1990. He has 6-7 glasses of brandy or wine a week.    He exercises regularly with low impact aerobics 2 days a week and daily walks of 30-45 minutes. His exercise sessions or 60 minutes. He will do some type of exercise at least 7 days a week and is very active doing yard work and wood work.      Patient is right-handed. He lives with his wife in a one level home with a basement. He drinks 4-5 cups of decaf coffee a day. He and his wife go to the gym 1-2 x a week.   Social Determinants of Health   Financial Resource Strain: Low Risk   . Difficulty of Paying Living Expenses: Not hard at all  Food Insecurity: No Food Insecurity  . Worried About Charity fundraiser in the Last Year: Never true  . Ran Out of Food in the Last Year: Never true  Transportation Needs: No Transportation Needs  . Lack of Transportation (Medical): No  . Lack of Transportation (Non-Medical): No  Physical Activity:    . Days of Exercise per Week: Not on file  . Minutes of Exercise per Session: Not on file  Stress:   . Feeling of Stress : Not on file  Social Connections:   . Frequency of Communication with Friends and Family: Not on file  . Frequency of Social Gatherings with Friends and Family: Not on file  . Attends Religious Services: Not on file  . Active Member of Clubs or Organizations: Not on file  . Attends Archivist Meetings: Not on file  . Marital Status: Not on file  Intimate Partner Violence:   . Fear of Current or Ex-Partner: Not on file  . Emotionally Abused: Not on file  . Physically Abused: Not on file  . Sexually Abused: Not on file    Outpatient Medications Prior to Visit  Medication Sig Dispense Refill  . Cholecalciferol (VITAMIN D3) 125 MCG (5000 UT) CAPS Take 1 capsule (5,000 Units total) by mouth daily. 30 capsule 0  . ELIQUIS 5 MG TABS tablet Take 1 tablet by mouth twice daily 180 tablet 0  . gabapentin (NEURONTIN) 300 MG capsule Take 3 capsules (900 mg total) by mouth 3 (three) times daily. 270 capsule 3  . Multiple Vitamin (MULTIVITAMIN WITH MINERALS) TABS tablet Take 1 tablet by mouth daily.      No facility-administered medications prior to visit.    No Known Allergies  Review of Systems  Constitutional: Negative for fever and malaise/fatigue.  HENT: Positive for hearing loss and tinnitus. Negative for congestion.   Eyes: Negative for blurred vision.  Respiratory: Negative for shortness of breath.   Cardiovascular: Negative for chest pain, palpitations and leg swelling.  Gastrointestinal: Negative for abdominal pain, blood in stool and nausea.  Genitourinary: Negative for dysuria and frequency.  Musculoskeletal: Positive for back pain and myalgias. Negative for falls.  Skin: Negative for rash.  Neurological: Negative for dizziness, loss of consciousness and headaches.  Endo/Heme/Allergies: Negative for environmental allergies.   Psychiatric/Behavioral: Negative for depression. The patient is not nervous/anxious.        Objective:    Physical Exam Vitals and nursing note reviewed.  Constitutional:      General: He is not in acute distress.    Appearance: He is well-developed.  HENT:     Head: Normocephalic and atraumatic.     Nose: Nose normal.  Eyes:     General:        Right eye: No discharge.        Left eye: No discharge.  Cardiovascular:     Rate and Rhythm: Normal rate and regular rhythm.     Heart sounds: No murmur heard.   Pulmonary:     Effort: Pulmonary effort is normal.     Breath sounds: Normal breath sounds.  Abdominal:     General: Bowel sounds are normal.     Palpations: Abdomen is soft.     Tenderness: There is no abdominal tenderness.  Musculoskeletal:     Cervical back: Normal range of motion and neck supple.  Skin:    General: Skin is warm and dry.  Neurological:     Mental Status: He is alert and oriented to person, place, and time.     BP 110/74 (BP Location: Left Arm, Patient Position: Sitting, Cuff Size: Large)   Pulse 78   Temp (!) 97.4 F (36.3 C) (Oral)   Resp 13   Ht 5\' 9"  (1.753 m)   Wt 215 lb 12.8 oz (97.9 kg)   SpO2 100%   BMI 31.87 kg/m  Wt Readings from Last 3 Encounters:  09/11/20 215 lb 12.8 oz (97.9 kg)  08/27/20 211 lb (95.7 kg)  07/23/20 (!) 211 lb (95.7 kg)    Diabetic Foot Exam - Simple   No data filed     Lab Results  Component Value Date   WBC 8.9 10/11/2019   HGB 15.3 10/11/2019   HCT 45.2 10/11/2019   PLT 214.0 10/11/2019   GLUCOSE 84 07/23/2020   CHOL 151 07/23/2020   TRIG 82 07/23/2020   HDL 54 07/23/2020  LDLCALC 81 07/23/2020   ALT 18 07/23/2020   AST 19 07/23/2020   NA 143 07/23/2020   K 4.8 07/23/2020   CL 105 07/23/2020   CREATININE 0.94 07/23/2020   BUN 20 07/23/2020   CO2 25 07/23/2020   TSH 1.16 10/11/2019   PSA 4.94 (H) 01/15/2010   INR 1.22 04/04/2015   HGBA1C 5.5 07/23/2020    Lab Results  Component  Value Date   TSH 1.16 10/11/2019   Lab Results  Component Value Date   WBC 8.9 10/11/2019   HGB 15.3 10/11/2019   HCT 45.2 10/11/2019   MCV 97.3 10/11/2019   PLT 214.0 10/11/2019   Lab Results  Component Value Date   NA 143 07/23/2020   K 4.8 07/23/2020   CO2 25 07/23/2020   GLUCOSE 84 07/23/2020   BUN 20 07/23/2020   CREATININE 0.94 07/23/2020   BILITOT 1.1 07/23/2020   ALKPHOS 63 07/23/2020   AST 19 07/23/2020   ALT 18 07/23/2020   PROT 7.0 07/23/2020   ALBUMIN 4.5 07/23/2020   CALCIUM 10.8 (H) 07/23/2020   GFR 80.84 10/11/2019   Lab Results  Component Value Date   CHOL 151 07/23/2020   Lab Results  Component Value Date   HDL 54 07/23/2020   Lab Results  Component Value Date   LDLCALC 81 07/23/2020   Lab Results  Component Value Date   TRIG 82 07/23/2020   Lab Results  Component Value Date   CHOLHDL 3 10/11/2019   Lab Results  Component Value Date   HGBA1C 5.5 07/23/2020       Assessment & Plan:   Problem List Items Addressed This Visit    OSA on CPAP (Chronic)    Using CPAP daily      Bradycardia - Primary (Chronic)   Relevant Orders   CBC   TSH   Hypertriglyceridemia (Chronic)    Encouraged heart healthy diet, increase exercise, avoid trans fats, consider a krill oil cap daily      Post herpetic neuralgia    Right chest wall and posterior right shoulder. Yesterday it really flared if he lies down or applies CBD spray it calms down. It hurts each morning until he gets moving. He is encouraged to try ice.       Vitamin D deficiency    supplment and monitor       Hyperglycemia    hgba1c acceptable, minimize simple carbs. Increase exercise as tolerated. Given high dose flu shot today. He is agreeable to a third COVID shot when he is eligible      Relevant Orders   Comprehensive metabolic panel   Muscle cramps    Hydrate and monitor      Bilateral hearing loss    Referred to [facility]. It is worsening and he is ready for  evaluation. His tinnitus comes and goes.       Relevant Orders   Ambulatory referral to Audiology    Other Visit Diagnoses    Tinnitus of both ears       Relevant Orders   Ambulatory referral to Audiology      I am having [name] A. Margarita Grizzle "[name]" maintain his multivitamin with minerals, Eliquis, gabapentin, and Vitamin D3.  No orders of the defined types were placed in this encounter.    [name], MD

## 2020-09-11 NOTE — Patient Instructions (Signed)

## 2020-09-11 NOTE — Assessment & Plan Note (Signed)
Encouraged heart healthy diet, increase exercise, avoid trans fats, consider a krill oil cap daily 

## 2020-09-11 NOTE — Assessment & Plan Note (Signed)
Referred to [facility]. It is worsening and he is ready for evaluation. His tinnitus comes and goes.

## 2020-09-11 NOTE — Assessment & Plan Note (Addendum)
hgba1c acceptable, minimize simple carbs. Increase exercise as tolerated. Given high dose flu shot today. He is agreeable to a third COVID shot when he is eligible

## 2020-09-11 NOTE — Assessment & Plan Note (Signed)
Hydrate and monitor 

## 2020-09-11 NOTE — Assessment & Plan Note (Signed)
Right chest wall and posterior right shoulder. Yesterday it really flared if he lies down or applies CBD spray it calms down. It hurts each morning until he gets moving. He is encouraged to try ice.

## 2020-09-12 LAB — COMPREHENSIVE METABOLIC PANEL
AG Ratio: 1.6 (calc) (ref 1.0–2.5)
ALT: 15 U/L (ref 9–46)
AST: 15 U/L (ref 10–35)
Albumin: 4.1 g/dL (ref 3.6–5.1)
Alkaline phosphatase (APISO): 52 U/L (ref 35–144)
BUN: 23 mg/dL (ref 7–25)
CO2: 28 mmol/L (ref 20–32)
Calcium: 9.4 mg/dL (ref 8.6–10.3)
Chloride: 106 mmol/L (ref 98–110)
Creat: 0.92 mg/dL (ref 0.70–1.18)
Globulin: 2.5 g/dL (calc) (ref 1.9–3.7)
Glucose, Bld: 95 mg/dL (ref 65–99)
Potassium: 4.7 mmol/L (ref 3.5–5.3)
Sodium: 141 mmol/L (ref 135–146)
Total Bilirubin: 1.1 mg/dL (ref 0.2–1.2)
Total Protein: 6.6 g/dL (ref 6.1–8.1)

## 2020-09-12 LAB — CBC
HCT: 44.6 % (ref 38.5–50.0)
Hemoglobin: 15 g/dL (ref 13.2–17.1)
MCH: 32.9 pg (ref 27.0–33.0)
MCHC: 33.6 g/dL (ref 32.0–36.0)
MCV: 97.8 fL (ref 80.0–100.0)
MPV: 10.3 fL (ref 7.5–12.5)
Platelets: 184 10*3/uL (ref 140–400)
RBC: 4.56 10*6/uL (ref 4.20–5.80)
RDW: 12.9 % (ref 11.0–15.0)
WBC: 5.7 10*3/uL (ref 3.8–10.8)

## 2020-09-12 LAB — TSH: TSH: 1.51 mIU/L (ref 0.40–4.50)

## 2020-10-07 DIAGNOSIS — H903 Sensorineural hearing loss, bilateral: Secondary | ICD-10-CM | POA: Diagnosis not present

## 2020-10-08 ENCOUNTER — Other Ambulatory Visit: Payer: Self-pay

## 2020-10-08 ENCOUNTER — Encounter (INDEPENDENT_AMBULATORY_CARE_PROVIDER_SITE_OTHER): Payer: Self-pay | Admitting: Family Medicine

## 2020-10-08 ENCOUNTER — Ambulatory Visit (INDEPENDENT_AMBULATORY_CARE_PROVIDER_SITE_OTHER): Payer: Medicare Other | Admitting: Family Medicine

## 2020-10-08 VITALS — BP 111/71 | HR 100 | Temp 97.8°F | Ht 69.0 in | Wt 213.0 lb

## 2020-10-08 DIAGNOSIS — E669 Obesity, unspecified: Secondary | ICD-10-CM | POA: Diagnosis not present

## 2020-10-08 DIAGNOSIS — R7303 Prediabetes: Secondary | ICD-10-CM | POA: Diagnosis not present

## 2020-10-08 DIAGNOSIS — Z6831 Body mass index (BMI) 31.0-31.9, adult: Secondary | ICD-10-CM

## 2020-10-08 DIAGNOSIS — E559 Vitamin D deficiency, unspecified: Secondary | ICD-10-CM | POA: Diagnosis not present

## 2020-10-09 ENCOUNTER — Ambulatory Visit: Payer: Medicare Other | Admitting: Cardiology

## 2020-10-09 ENCOUNTER — Encounter: Payer: Self-pay | Admitting: Cardiology

## 2020-10-09 VITALS — BP 126/80 | HR 79 | Ht 70.0 in | Wt 221.6 lb

## 2020-10-09 DIAGNOSIS — G4733 Obstructive sleep apnea (adult) (pediatric): Secondary | ICD-10-CM

## 2020-10-09 DIAGNOSIS — Z01818 Encounter for other preprocedural examination: Secondary | ICD-10-CM | POA: Diagnosis not present

## 2020-10-09 DIAGNOSIS — R001 Bradycardia, unspecified: Secondary | ICD-10-CM

## 2020-10-09 DIAGNOSIS — Z9989 Dependence on other enabling machines and devices: Secondary | ICD-10-CM

## 2020-10-09 DIAGNOSIS — I4819 Other persistent atrial fibrillation: Secondary | ICD-10-CM | POA: Diagnosis not present

## 2020-10-09 DIAGNOSIS — Z01810 Encounter for preprocedural cardiovascular examination: Secondary | ICD-10-CM

## 2020-10-09 NOTE — Progress Notes (Signed)
PCP: Mosie Lukes, MD  Clinic Note: Chief Complaint  Patient presents with  . Follow-up    Doing well.  No complaints  . Atrial Fibrillation    Unable to tell if he is or is not in A. fib.  . Pre-op Exam    Shoulder surgery    HPI:    [name] [name] is a 79 y.o. male with Persistent AFib (auto rate controlled, on Eliquis since March 2018) who is being seen today for annual f/u, and preop evaluation for shoulder surgery by Dr. Onnie Graham scheduled for October 21.  [name] [name] was last seen on on October 10, 2019 as his second visit after a long break from cardiology.  Before that he was seen on 10/10/2018 (as the first visit in 3 yrs).  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  Usually asymptomatic A. fib. --> A. fib controlled.  No symptoms whatsoever.  He has no idea when he is or is not in A. fib.  No bleeding issues besides bruising.  Recent Hospitalizations:   None  Reviewed  CV studies:   The following studies were reviewed today: (if available, images/films reviewed: From Epic Chart or Care Everywhere) . None    Interval History:   [name] [name] is here today again accompanied by his daughter [name], [place] pharmacist for [facility]).  As is usually the case, he is pretty much completely asymptomatic right comes to knowing whether he is or is not in A. fib.  Today's EKG shows A. fib, and he has no awareness of it.  No sensation of irregular heartbeat or skipping beats.  No tachycardia sensations. He continues to be active and has not had any chest pain pressure or dyspnea with rest or exertion.   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  Overall completely asymptomatic besides some bruising while being on Eliquis.  Cardiovascular review of symptoms: no chest pain or dyspnea on exertion positive for - Only mild bruising. negative for - edema, irregular heartbeat, orthopnea, palpitations, paroxysmal nocturnal  dyspnea, rapid heart rate, shortness of breath or Syncope/near syncope, TIA/amaurosis fugax, melena, hematochezia, hematuria, epistaxis.  Only minor bruising.  The patient DOES NOT HAVE SYMPTOMS concerning for COVID-19 infection (fever, chills, cough, or new shortness of breath).  The patient is practicing social distancing, continues to be adherent with masking.  He has had his vaccinations, and is planning to get his booster shot once he had his surgery.   REVIEWED OF SYSTEMS   ROS: A comprehensive was performed. Review of Systems  Constitutional: Negative for malaise/fatigue and weight loss.  HENT: Negative for congestion and nosebleeds.   Respiratory: Negative for cough and shortness of breath.   Gastrointestinal: Negative for blood in stool, heartburn and melena.  Genitourinary: Negative for flank pain and hematuria (No further).       No more kidney stones  Musculoskeletal: Positive for joint pain (Right shoulder is quite stiff and painful). Negative for myalgias.  Neurological: Negative for dizziness and focal weakness.  Psychiatric/Behavioral: Negative for memory loss. The patient has insomnia. The patient is not nervous/anxious.     I have reviewed and (if needed) personally updated the patient's problem list, medications, allergies, past medical and surgical history, social and family history.   PAST MEDICAL HISTORY   Past Medical History:  Diagnosis Date  . Atrial fibrillation ([place])   . BPH (benign prostatic hypertrophy)   . Bradycardia 12/30/2014  HR routinely in mid 40s-50s  . Coronary artery disease (CAD) excluded April 2016   False-positive nuclear stress test suggesting inferior ischemia  . Dysrhythmia   . Elevated PSA   . Encounter for Medicare annual wellness exam 09/22/2013   Sees Dr Wilhemina Bonito of Derm Sees Dr Silvano Rusk of Gastroenterology Sees Dr Kathie Rhodes of [facility] Sees Dr Susa Day of Optometry  . GERD (gastroesophageal reflux disease)      controlled w/ diet and behavioral changes, history of  . Grade I diastolic dysfunction 1660  . History of kidney stones   . Hypertriglyceridemia 03/16/2015  . Lymphadenitis   . Melanoma of back ([place])    Excised Dr Wilhemina Bonito  . Mild ascending aorta dilatation (HCC)   . New onset a-fib ([place]) 03/10/2015  . OA (osteoarthritis)   . Otitis, externa, infective 08/10/2015  . Palpitations   . Post herpetic neuralgia   . Shingles 07/09/2013  . Sleep apnea 07/30/2011   cpap- 13   . Snoring disorder 07/30/2011     PAST SURGICAL HISTORY   Past Surgical History:  Procedure Laterality Date  . CATARACT EXTRACTION Right   . COLONOSCOPY  2018  . CYSTOSCOPY/URETEROSCOPY/HOLMIUM LASER/STENT PLACEMENT Left 10/05/2019   Procedure: CYSTOSCOPY/RETROGRADE/URETEROSCOPY/HOLMIUM LASER/STENT PLACEMENT;  Surgeon: Kathie Rhodes, MD;  Location: [facility];  Service: Urology;  Laterality: Left;  . EYE SURGERY Left 12/06/2017   cataract by Dr Gillian Scarce  . INGUINAL HERNIA REPAIR Bilateral 06/01/2019   Procedure: LAPAROSCOPIC LEFT AND  RIGHT INGUINAL HERNIA REPAIR WITH MESH;  Surgeon: Michael Boston, MD;  Location: [facility];  Service: General;  Laterality: Bilateral;  . LEFT HEART CATHETERIZATION WITH CORONARY ANGIOGRAM N/A 04/10/2015   Procedure: LEFT HEART CATHETERIZATION WITH  CORONARY ANGIOGRAM;  Surgeon: Leonie Man, MD;  Location: [facility] CATH LAB;  Service: Cardiovascular;  Angiographicallly NORMAL CORONARY ARTERIES  . MASS EXCISION N/A 05/16/2014   Procedure: EXCISION POSTERIOR NECK MASS, RIGHT CHEST WALL MASS AND RIGHT AXILLARY MASS AXILLARY NODE DISSECTION;  Surgeon: Adin Hector, MD;  Location: [facility];  Service: General;  Laterality: N/A;  . NM MYOVIEW LTD  03/19/2015   FALSE POSITIVE:  INTERMEDIATE RISK. Small sized, moderate intensity inferior ischemic perfusion defect  . TRANSTHORACIC ECHOCARDIOGRAM  03/20/2015   Normal LV size and function. EF 60-65%. G1 DD. Trivial AI.  Borderline aortic root dilation (41 mm), Mild LA dilation.     MEDICATIONS/ALLERGIES   Current Meds  Medication Sig  . Cholecalciferol (VITAMIN D3) 125 MCG (5000 UT) CAPS Take 1 capsule (5,000 Units total) by mouth daily.  Marland Kitchen ELIQUIS 5 MG TABS tablet Take 1 tablet by mouth twice daily (Patient taking differently: Take 5 mg by mouth. )  . gabapentin (NEURONTIN) 300 MG capsule Take 3 capsules (900 mg total) by mouth 3 (three) times daily.  . Multiple Vitamin (MULTIVITAMIN WITH MINERALS) TABS tablet Take 1 tablet by mouth daily.     No Known Allergies   SOCIAL HISTORY/FAMILY HISTORY   Social History   Tobacco Use  . Smoking status: Former Smoker    Years: 27.00    Types: Pipe, Cigars    Quit date: 02/11/1989    Years since quitting: 31.6  . Smokeless tobacco: Never Used  Vaping Use  . Vaping Use: Never used  Substance Use Topics  . Alcohol use: Yes    Alcohol/week: 7.0 standard drinks    Types: 7 Glasses of wine per week    Comment: 1 per day --  brandy or wine  . Drug use: No   Social History   Social History Narrative   He is a married father of 2, and grandfather of 33. He has been married to his wife [name] for 51 years. He previously lived in [place] but has moved with his wife to [place], [place] to be close to his daughter [name] and her family. [name] is our Nurse, adult.   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 He quit smoking in 1990. He has 6-7 glasses of brandy or wine a week.    He exercises regularly with low impact aerobics 2 days a week and daily walks of 30-45 minutes. His exercise sessions or 60 minutes. He will do some type of exercise at least 7 days a week and is very active doing yard work and wood work.      Patient is right-handed. He lives with his wife in a one level home with a basement. He drinks 4-5 cups of decaf coffee a day. He and his wife go to the gym 1-2 x a week.    family history  includes Alcohol abuse in his father and another family member; COPD in his paternal grandfather; COPD (age of onset: 34) in his brother; Cancer (age of onset: 24) in his sister; Cancer (age of onset: 68) in his mother; Diabetes (age of onset: 27) in his sister; Down syndrome (age of onset: 32) in his brother; Emphysema (age of onset: 42) in his father; Heart disease in his paternal grandmother; Obesity in his father, mother, and sister.   OBJCTIVE -PE, EKG, labs   Wt Readings from Last 3 Encounters:  10/10/20 220 lb 1 oz (99.8 kg)  10/09/20 221 lb 9.6 oz (100.5 kg)  10/08/20 213 lb (96.6 kg)    Physical Exam: BP 126/80   Pulse 79   Ht 5\' 10"  (1.778 m)   Wt 221 lb 9.6 oz (100.5 kg)   BMI 31.80 kg/m  Physical Exam Vitals reviewed.  Constitutional:      General: He is not in acute distress.    Appearance: Normal appearance. He is well-developed. He is obese. He is not ill-appearing.     Comments: Well-groomed.  Healthy-appearing.  HENT:     Head: Normocephalic and atraumatic.  Neck:     Vascular: No carotid bruit, hepatojugular reflux or JVD.  Cardiovascular:     Rate and Rhythm: Normal rate. Rhythm irregularly irregular.     Chest Wall: PMI is not displaced.     Pulses: Intact distal pulses.     Heart sounds: Normal heart sounds. No murmur heard.  No friction rub. No gallop.   Pulmonary:     Effort: Pulmonary effort is normal. No respiratory distress.     Breath sounds: Normal breath sounds. No wheezing or rales.  Abdominal:     Palpations: Abdomen is soft.  Musculoskeletal:        General: Normal range of motion.     Cervical back: Normal range of motion and neck supple.  Neurological:     Mental Status: He is alert and oriented to person, place, and time.  Psychiatric:        Behavior: Behavior normal.        Thought Content: Thought content normal.        Judgment: Judgment normal.      Adult ECG Report  Rate: 79 ;  RhyVisiting atrial fibrillation and Normal  axis, intervals  and durations.;   Narrative Interpretation: Stable EKG  Recent Labs: Followed by PCP.  Last labs noted are from May 2019-TC 131, TG 56, HDL 50, LDL 70. Lab Results  Component Value Date   WBC 6.4 10/10/2020   HGB 14.4 10/10/2020   HCT 42.6 10/10/2020   MCV 96.8 10/10/2020   PLT 172 10/10/2020   Lab Results  Component Value Date   CREATININE 0.98 10/10/2020   BUN 25 (H) 10/10/2020   NA 137 10/10/2020   K 4.3 10/10/2020   CL 106 10/10/2020   CO2 24 10/10/2020     ASSESSMENT/PLAN    Problem List Items Addressed This Visit    Persistent atrial fibrillation (HCC) - CHA2DS2-VASc Score (Age-49, Aortic Plaque - 1) (Chronic)    He seems be in A. fib maybe more often than he thought, but is totally asymptomatic.  He has not required any rate control.  With no symptoms, no need for rhythm control.  Remains on Eliquis with no major issues.   Okay to hold Eliquis 3 days prior to his upcoming surgery or any further surgeries in 2 to 3 days depending on the procedure.  With low CHA2DS2-VASc score, is reasonable to hold for 3 days.       Relevant Orders   EKG 12-Lead (Completed)   OSA on CPAP (Chronic)    Tolerating well.  No issues.  Seems to sleep better with CPAP.      Bradycardia (Chronic)    No longer bradycardic.  With a history of bradycardia there is tendency for him potentially to develop tachybradycardia.  As such, I am avoiding AV nodal agents for rate or rhythm control.      Preop cardiovascular exam    [name] [name] is completely asymptomatic from his A. fib.  Does not require any rate or rhythm control.  He had a complete ischemic evaluation back in 2016 when he was diagnosed with A. fib and had an abnormal stress test.  As such, I would not repeat a stress test in the absence of symptoms.  He is easily able to achieve greater than 4 METS without any symptoms.  Shoulder surgery is relatively low risk from a cardiovascular standpoint.Marland Kitchen  He is nondiabetic, has no  active CAD or CHF symptoms.  Normal renal function.  PREOPERATIVE CARDIAC RISK ASSESSMENT   Revised Cardiac Risk Index:  High Risk Surgery: no; low risk  Defined as Intraperitoneal, intrathoracic or suprainguinal vascular  Active CAD: No-high false positive stress test confirmed with angiographically minimal CAD in 2016.  CHF: no; normal EF by echo.  No HFpEF.  Easily tolerating A. fib.  Cerebrovascular Disease: no;   Diabetes: No  CKD (Cr >~ 2): no; last documented creatinine was 0.92.  Total: 0 Estimated Risk of Adverse Outcome: CLASS I/LOW RISK Estimated Risk of MI, PE, VF/VT (Cardiac Arrest), Complete Heart Block: 1-4 %   ACC/AHA Guidelines for "Clearance":  Step 1 - Need for Emergency Surgery: No:   If Yes - go straight to OR with perioperative surveillance  Step 2 - Active Cardiac Conditions (Unstable Angina, Decompensated HF, Significant  Arrhytmias - Complete HB, Mobitz II, Symptomatic VT or SVT, Severe Aortic Stenosis - mean gradient > 40 mmHg, Valve area < 1.0 cm2):   No: Stable asymptomatic A. fib  If Yes - Evaluate & Treat per ACC/AHA Guidelines  Step 3 -  Low Risk Surgery: Yes  If Yes --> proceed to OR  If No --> Step 4  Step 4 -  Functional Capacity >= 4 METS without symptoms: Yes  If Yes --> proceed to OR  If No --> Step 5  Step 5 --  Clinical Risk Factors (CRF) as noted above, score is 0  No CRFs: Yes  If Yes --> Proceed to OR   Recommendations:   Okay to proceed to the OR without any further cardiac evaluation as it would not change medical management.  LOW RISK PATIENT FOR LOW RISK PROCEDURE  Okay to hold Eliquis 3 days preop, restart when safe from surgical standpoint postop       Other Visit Diagnoses    Pre-op evaluation    -  Primary   Relevant Orders   EKG 12-Lead (Completed)       COVID-19 Education: The signs and symptoms of COVID-19 were discussed with the patient and how to seek care for testing (follow up with PCP  or arrange E-visit).   The importance of social distancing was discussed today.  I spent a total of 53minutes with the patient and chart review. >  50% of the time was spent in direct patient consultation.  Additional time spent with chart review (studies, outside notes, etc): 5 Total Time: 20 min   Current medicines are reviewed at length with the patient today.  (+/- concerns) none   Patient Instructions / Medication Changes & Studies & Tests Ordered   Patient Instructions  Medication Instructions:  No changes *If you need a refill on your cardiac medications before your next appointment, please call your pharmacy*   Lab Work: Not needed If you have labs (blood work) drawn today and your tests are completely normal, you will receive your results only by: Marland Kitchen MyChart Message (if you have MyChart) OR . A paper copy in the mail If you have any lab test that is abnormal or we need to change your treatment, we will call you to review the results.   Testing/Procedures: Not needed   Follow-Up: At [facility], you and your health needs are our priority.  As part of our continuing mission to provide you with exceptional heart care, we have created designated Provider Care Teams.  These Care Teams include your primary Cardiologist (physician) and Advanced Practice Providers (APPs -  Physician Assistants and Nurse Practitioners) who all work together to provide you with the care you need, when you need it.  We recommend signing up for the patient portal called "MyChart".  Sign up information is provided on this After Visit Summary.  MyChart is used to connect with patients for Virtual Visits (Telemedicine).  Patients are able to view lab/test results, encounter notes, upcoming appointments, etc.  Non-urgent messages can be sent to your provider as well.   To learn more about what you can do with MyChart, go to [email or web address].    Your next appointment:   12 month(s)  The  format for your next appointment:   In Person  Provider:   Glenetta Hew, MD   Other Instructions Cleared for surgery --can hold Eliquis for 3 days prior to surgery     Studies Ordered:   Orders Placed This Encounter  Procedures  . EKG 12-Lead     Glenetta Hew, M.D., M.S. Interventional Cardiologist   Pager # [phone] Phone # [phone] [address]. [place], [place] [address]   Thank you for choosing Heartcare at [facility]!!

## 2020-10-09 NOTE — Patient Instructions (Addendum)
Medication Instructions:  No changes *If you need a refill on your cardiac medications before your next appointment, please call your pharmacy*   Lab Work: Not needed If you have labs (blood work) drawn today and your tests are completely normal, you will receive your results only by: Marland Kitchen MyChart Message (if you have MyChart) OR . A paper copy in the mail If you have any lab test that is abnormal or we need to change your treatment, we will call you to review the results.   Testing/Procedures: Not needed   Follow-Up: At [facility] you and your health needs are our priority.  As part of our continuing mission to provide you with exceptional heart care, we have created designated Provider Care Teams.  These Care Teams include your primary Cardiologist (physician) and Advanced Practice Providers (APPs -  Physician Assistants and Nurse Practitioners) who all work together to provide you with the care you need, when you need it.  We recommend signing up for the patient portal called "MyChart".  Sign up information is provided on this After Visit Summary.  MyChart is used to connect with patients for Virtual Visits (Telemedicine).  Patients are able to view lab/test results, encounter notes, upcoming appointments, etc.  Non-urgent messages can be sent to your provider as well.   To learn more about what you can do with MyChart, go to [email or web address].    Your next appointment:   12 month(s)  The format for your next appointment:   In Person  Provider:   Glenetta Hew, MD   Other Instructions Cleared for surgery --can hold Eliquis for 3 days prior to surgery

## 2020-10-09 NOTE — Patient Instructions (Addendum)
DUE TO COVID-19 ONLY ONE VISITOR IS ALLOWED TO COME WITH YOU AND STAY IN THE WAITING ROOM ONLY DURING PRE OP AND PROCEDURE DAY OF SURGERY. THE 1 VISITOR  MAY VISIT WITH YOU AFTER SURGERY IN YOUR PRIVATE ROOM DURING VISITING HOURS ONLY!  YOU NEED TO HAVE A COVID 19 TEST ON__10/18_____ @_8 :25______, THIS TEST MUST BE DONE BEFORE SURGERY,  COVID TESTING SITE [address] [place] [address], IT IS ON THE RIGHT GOING OUT [address] APPROXIMATELY  2 MINUTES PAST [employer] ON THE RIGHT. ONCE YOUR COVID TEST IS COMPLETED,  PLEASE BEGIN THE QUARANTINE INSTRUCTIONS AS OUTLINED IN YOUR HANDOUT.                [name] [name]   Your procedure is scheduled on: 10/16/20   Report to [facility] Main  Entrance   Report to Short Stay at 5:30 AM     Call this number if you have problems the morning of surgery [place], NO CHEWING GUM [place].   No food after midnight.    You may have clear liquid until 4:30 AM.    At 4:30 AM drink pre surgery drink  . Nothing by mouth after 4:30 AM.    Take these medicines the morning of surgery with A SIP OF WATER: Gabapentin.  Bring mask and tubing if you use a C-Pap                                You may not have any metal on your body including              piercings  Do not wear jewelry,  lotions, powders or deodorant                     Men may shave face and neck.   Do not bring valuables to the hospital. [place].  Contacts, dentures or bridgework may not be worn into surgery.                    Please read over the following fact sheets you were given: _____________________________________________________________________  [facility]- Preparing for Total Shoulder Arthroplasty    Before surgery, you can play an important role. Because skin is not sterile, your skin needs to be as free  of germs as possible. You can reduce the number of germs on your skin by using the following products. . Benzoyl Peroxide Gel o Reduces the number of germs present on the skin o Applied twice a day to shoulder area starting two days before surgery    ==================================================================  Please follow these instructions carefully:  BENZOYL PEROXIDE 5% GEL  Please do not use if you have an allergy to benzoyl peroxide.   If your skin becomes reddened/irritated stop using the benzoyl peroxide.  Starting two days before surgery, apply as follows: 1. Apply benzoyl peroxide in the morning and at night. Apply after taking a shower. If you are not taking a shower clean entire shoulder front, back, and side along with the armpit with a clean wet washcloth.  2. Place a quarter-sized dollop on your shoulder and rub in thoroughly, making sure to cover the front, back,  and side of your shoulder, along with the armpit.   2 days before ____ AM   ____ PM              1 day before ____ AM   ____ PM                         3. Do this twice a day for two days.  (Last application is the night before surgery, AFTER using the CHG soap as described below).  4. Do NOT apply benzoyl peroxide gel on the day of surgery.            [facility] - Preparing for Surgery Before surgery, you can play an important role.   Because skin is not sterile, your skin needs to be as free of germs as possible .  You can reduce the number of germs on your skin by washing with CHG (chlorahexidine gluconate) soap before surgery.   CHG is an antiseptic cleaner which kills germs and bonds with the skin to continue killing germs even after washing. Please DO NOT use if you have an allergy to CHG or antibacterial soaps.   If your skin becomes reddened/irritated stop using the CHG and inform your nurse when you arrive at Short Stay.  You may shave your face/neck.  Please follow these instructions  carefully:  1.  Shower with CHG Soap the night before surgery and the  morning of Surgery.  2.  If you choose to wash your hair, wash your hair first as usual with your  normal  shampoo.  3.  After you shampoo, rinse your hair and body thoroughly to remove the  shampoo.                                        4.  Use CHG as you would any other liquid soap.  You can apply chg directly  to the skin and wash                       Gently with a scrungie or clean washcloth.  5.  Apply the CHG Soap to your body ONLY FROM THE NECK DOWN.   Do not use on face/ open                           Wound or open sores. Avoid contact with eyes, ears mouth and genitals (private parts).                       Wash face,  Genitals (private parts) with your normal soap.             6.  Wash thoroughly, paying special attention to the area where your surgery  will be performed.  7.  Thoroughly rinse your body with warm water from the neck down.  8.  DO NOT shower/wash with your normal soap after using and rinsing off  the CHG Soap.             9.  Pat yourself dry with a clean towel.            10.  Wear clean pajamas.            11.  Place clean sheets on your bed the night  of your first shower and do not  sleep with pets. Day of Surgery : Do not apply any lotions/deodorants the morning of surgery.  Please wear clean clothes to the hospital/surgery center.  FAILURE TO FOLLOW THESE INSTRUCTIONS MAY RESULT IN THE CANCELLATION OF YOUR SURGERY PATIENT SIGNATURE_________________________________  NURSE SIGNATURE__________________________________  ________________________________________________________________________

## 2020-10-10 ENCOUNTER — Other Ambulatory Visit: Payer: Self-pay

## 2020-10-10 ENCOUNTER — Encounter (HOSPITAL_COMMUNITY)
Admission: RE | Admit: 2020-10-10 | Discharge: 2020-10-10 | Disposition: A | Discharge status: Home / Self Care | Payer: Medicare Other | Source: Ambulatory Visit | Attending: Orthopedic Surgery | Admitting: Orthopedic Surgery

## 2020-10-10 ENCOUNTER — Encounter: Payer: Self-pay | Admitting: Cardiology

## 2020-10-10 ENCOUNTER — Encounter (HOSPITAL_COMMUNITY): Payer: Self-pay

## 2020-10-10 DIAGNOSIS — I4891 Unspecified atrial fibrillation: Secondary | ICD-10-CM | POA: Insufficient documentation

## 2020-10-10 DIAGNOSIS — E119 Type 2 diabetes mellitus without complications: Secondary | ICD-10-CM | POA: Diagnosis not present

## 2020-10-10 DIAGNOSIS — Z01810 Encounter for preprocedural cardiovascular examination: Secondary | ICD-10-CM | POA: Insufficient documentation

## 2020-10-10 DIAGNOSIS — Z01812 Encounter for preprocedural laboratory examination: Secondary | ICD-10-CM | POA: Insufficient documentation

## 2020-10-10 HISTORY — DX: Cardiac arrhythmia, unspecified: I49.9

## 2020-10-10 LAB — BASIC METABOLIC PANEL
Anion gap: 7 (ref 5–15)
BUN: 25 mg/dL — ABNORMAL HIGH (ref 8–23)
CO2: 24 mmol/L (ref 22–32)
Calcium: 8.9 mg/dL (ref 8.9–10.3)
Chloride: 106 mmol/L (ref 98–111)
Creatinine, Ser: 0.98 mg/dL (ref 0.61–1.24)
GFR, Estimated: >60 mL/min (ref >60)
Glucose, Bld: 88 mg/dL (ref 70–99)
Potassium: 4.3 mmol/L (ref 3.5–5.1)
Sodium: 137 mmol/L (ref 135–145)

## 2020-10-10 LAB — CBC
HCT: 42.6 % (ref 39.0–52.0)
Hemoglobin: 14.4 g/dL (ref 13.0–17.0)
MCH: 32.7 pg (ref 26.0–34.0)
MCHC: 33.8 g/dL (ref 30.0–36.0)
MCV: 96.8 fL (ref 80.0–100.0)
Platelets: 172 10*3/uL (ref 150–400)
RBC: 4.4 MIL/uL (ref 4.22–5.81)
RDW: 13.3 % (ref 11.5–15.5)
WBC: 6.4 10*3/uL (ref 4.0–10.5)
nRBC: 0 % (ref 0.0–0.2)

## 2020-10-10 LAB — SURGICAL PCR SCREEN
MRSA, PCR: NEGATIVE
Staphylococcus aureus: NEGATIVE

## 2020-10-10 NOTE — Assessment & Plan Note (Signed)
No longer bradycardic.  With a history of bradycardia there is tendency for him potentially to develop tachybradycardia.  As such, I am avoiding AV nodal agents for rate or rhythm control.

## 2020-10-10 NOTE — Assessment & Plan Note (Signed)
[name] [name] is completely asymptomatic from his A. fib.  Does not require any rate or rhythm control.  He had a complete ischemic evaluation back in 2016 when he was diagnosed with A. fib and had an abnormal stress test.  As such, I would not repeat a stress test in the absence of symptoms.  He is easily able to achieve greater than 4 METS without any symptoms.  Shoulder surgery is relatively low risk from a cardiovascular standpoint.Marland Kitchen  He is nondiabetic, has no active CAD or CHF symptoms.  Normal renal function.  PREOPERATIVE CARDIAC RISK ASSESSMENT   Revised Cardiac Risk Index:  High Risk Surgery: no; low risk  Defined as Intraperitoneal, intrathoracic or suprainguinal vascular  Active CAD: No-high false positive stress test confirmed with angiographically minimal CAD in 2016.  CHF: no; normal EF by echo.  No HFpEF.  Easily tolerating A. fib.  Cerebrovascular Disease: no;   Diabetes: No  CKD (Cr >~ 2): no; last documented creatinine was 0.92.  Total: 0 Estimated Risk of Adverse Outcome: CLASS I/LOW RISK Estimated Risk of MI, PE, VF/VT (Cardiac Arrest), Complete Heart Block: 1-4 %   ACC/AHA Guidelines for "Clearance":  Step 1 - Need for Emergency Surgery: No:   If Yes - go straight to OR with perioperative surveillance  Step 2 - Active Cardiac Conditions (Unstable Angina, Decompensated HF, Significant  Arrhytmias - Complete HB, Mobitz II, Symptomatic VT or SVT, Severe Aortic Stenosis - mean gradient > 40 mmHg, Valve area < 1.0 cm2):   No: Stable asymptomatic A. fib  If Yes - Evaluate & Treat per ACC/AHA Guidelines  Step 3 -  Low Risk Surgery: Yes  If Yes --> proceed to OR  If No --> Step 4  Step 4 - Functional Capacity >= 4 METS without symptoms: Yes  If Yes --> proceed to OR  If No --> Step 5  Step 5 --  Clinical Risk Factors (CRF) as noted above, score is 0  No CRFs: Yes  If Yes --> Proceed to OR   Recommendations:   Okay to proceed to the OR without any  further cardiac evaluation as it would not change medical management.  LOW RISK PATIENT FOR LOW RISK PROCEDURE  Okay to hold Eliquis 3 days preop, restart when safe from surgical standpoint postop

## 2020-10-10 NOTE — Assessment & Plan Note (Addendum)
He seems be in A. fib maybe more often than he thought, but is totally asymptomatic.  He has not required any rate control.  With no symptoms, no need for rhythm control.  Remains on Eliquis with no major issues.   Okay to hold Eliquis 3 days prior to his upcoming surgery or any further surgeries in 2 to 3 days depending on the procedure.  With low CHA2DS2-VASc score, is reasonable to hold for 3 days.

## 2020-10-10 NOTE — Assessment & Plan Note (Signed)
Tolerating well.  No issues.  Seems to sleep better with CPAP.

## 2020-10-10 NOTE — Progress Notes (Signed)
COVID Vaccine Completed:Yes Date COVID Vaccine completed:02/25/20, booster 10/02/20 COVID vaccine manufacturer:   Moderna     PCP - Dr. Frederik Pear Cardiologist - Dr. Roni Bread  Chest x-ray - no EKG - 10/09/20-Epic Stress Test - 2016-Epic ECHO - 2016-Epic Cardiac Cath - 2016-Epic Pacemaker/ICD device last checked:NA  Sleep Study - yes CPAP - yes  Fasting Blood Sugar - NA Checks Blood Sugar _____ times a day  Blood Thinner Instructions:Eliquis/ Dr. Ellyn Hack Aspirin Instructions:Stop 4 days prior to DOS/ Dr. Ellyn Hack Last Dose:10/12/20  Anesthesia review:   Patient denies shortness of breath, fever, cough and chest pain at PAT appointment yes  Patient verbalized understanding of instructions that were given to them at the PAT appointment. Patient was also instructed that they will need to review over the PAT instructions again at home before surgery. Yes Pt is able to climb 2 flights of stairs, work  and  ADLs without SOB.

## 2020-10-13 ENCOUNTER — Other Ambulatory Visit (HOSPITAL_COMMUNITY)
Admission: RE | Admit: 2020-10-13 | Discharge: 2020-10-13 | Disposition: A | Discharge status: Home / Self Care | Payer: Medicare Other | Source: Ambulatory Visit | Attending: Orthopedic Surgery | Admitting: Orthopedic Surgery

## 2020-10-13 DIAGNOSIS — Z20822 Contact with and (suspected) exposure to covid-19: Secondary | ICD-10-CM | POA: Insufficient documentation

## 2020-10-13 DIAGNOSIS — Z01818 Encounter for other preprocedural examination: Secondary | ICD-10-CM | POA: Insufficient documentation

## 2020-10-13 LAB — SARS CORONAVIRUS 2 (TAT 6-24 HRS): SARS Coronavirus 2: NEGATIVE

## 2020-10-13 NOTE — Anesthesia Preprocedure Evaluation (Addendum)
Anesthesia Evaluation  Patient identified by MRN, date of birth, ID band Patient awake    Reviewed: Allergy & Precautions, NPO status , Patient's Chart, lab work & pertinent test results  Airway Mallampati: III  TM Distance: >3 FB Neck ROM: Full    Dental no notable dental hx. (+) Teeth Intact, Dental Advisory Given   Pulmonary sleep apnea and Continuous Positive Airway Pressure Ventilation , former smoker,  Quit smoking 1992, heavy smoker prior to quitting No inhalers  Compliant with CPAP   Pulmonary exam normal breath sounds clear to auscultation       Cardiovascular + CAD and +CHF (grade 1 diastolic dysfunction)  Normal cardiovascular exam+ dysrhythmias (eliquis- LD: 4 days ago) Atrial Fibrillation  Rhythm:Regular Rate:Normal  Echo 2016: - Left ventricle: The cavity size was normal. Wall thickness was  normal. Systolic function was normal. The estimated ejection  fraction was in the range of 60% to 65%. Doppler parameters are  consistent with abnormal left ventricular relaxation (grade 1  diastolic dysfunction).  - Aortic valve: There was trivial regurgitation.  - Aorta: Aortic root dimension: 41 mm (ED).  - Ascending aorta: The ascending aorta was mildly dilated.  - Left atrium: The atrium was mildly dilated.      Neuro/Psych negative neurological ROS  negative psych ROS   GI/Hepatic Neg liver ROS, GERD  Medicated and Controlled,  Endo/Other  Obesity BMI 32  Renal/GU negative Renal ROS  negative genitourinary   Musculoskeletal  (+) Arthritis , Osteoarthritis,  L shoulder OA   Abdominal (+) + obese,   Peds negative pediatric ROS (+)  Hematology negative hematology ROS (+)   Anesthesia Other Findings   Reproductive/Obstetrics negative OB ROS                           Anesthesia Physical Anesthesia Plan  ASA: III  Anesthesia Plan: General and Regional   Post-op Pain  Management: GA combined w/ Regional for post-op pain   Induction: Intravenous  PONV Risk Score and Plan: 2 and Ondansetron, Dexamethasone, Midazolam and Treatment may vary due to age or medical condition  Airway Management Planned: Oral ETT  Additional Equipment: None  Intra-op Plan:   Post-operative Plan: Extubation in OR  Informed Consent: I have reviewed the patients History and Physical, chart, labs and discussed the procedure including the risks, benefits and alternatives for the proposed anesthesia with the patient or authorized representative who has indicated his/her understanding and acceptance.     Dental advisory given  Plan Discussed with: CRNA  Anesthesia Plan Comments:       Anesthesia Quick Evaluation

## 2020-10-16 ENCOUNTER — Encounter (HOSPITAL_COMMUNITY)
Admission: RE | Disposition: A | Discharge status: Home / Self Care | Payer: Self-pay | Source: Home / Self Care | Attending: Orthopedic Surgery

## 2020-10-16 ENCOUNTER — Ambulatory Visit (HOSPITAL_COMMUNITY): Payer: Medicare Other | Admitting: Physician Assistant

## 2020-10-16 ENCOUNTER — Ambulatory Visit (HOSPITAL_COMMUNITY)
Admission: RE | Admit: 2020-10-16 | Discharge: 2020-10-16 | Disposition: A | Discharge status: Home / Self Care | Payer: Medicare Other | Attending: Orthopedic Surgery | Admitting: Orthopedic Surgery

## 2020-10-16 ENCOUNTER — Ambulatory Visit (HOSPITAL_COMMUNITY): Payer: Medicare Other | Admitting: Anesthesiology

## 2020-10-16 ENCOUNTER — Encounter (HOSPITAL_COMMUNITY): Payer: Self-pay | Admitting: Orthopedic Surgery

## 2020-10-16 DIAGNOSIS — Z7901 Long term (current) use of anticoagulants: Secondary | ICD-10-CM | POA: Insufficient documentation

## 2020-10-16 DIAGNOSIS — G8918 Other acute postprocedural pain: Secondary | ICD-10-CM | POA: Diagnosis not present

## 2020-10-16 DIAGNOSIS — G4733 Obstructive sleep apnea (adult) (pediatric): Secondary | ICD-10-CM | POA: Diagnosis not present

## 2020-10-16 DIAGNOSIS — E669 Obesity, unspecified: Secondary | ICD-10-CM | POA: Diagnosis not present

## 2020-10-16 DIAGNOSIS — Z6832 Body mass index (BMI) 32.0-32.9, adult: Secondary | ICD-10-CM | POA: Insufficient documentation

## 2020-10-16 DIAGNOSIS — M19012 Primary osteoarthritis, left shoulder: Secondary | ICD-10-CM | POA: Insufficient documentation

## 2020-10-16 DIAGNOSIS — Z79899 Other long term (current) drug therapy: Secondary | ICD-10-CM | POA: Insufficient documentation

## 2020-10-16 DIAGNOSIS — G4730 Sleep apnea, unspecified: Secondary | ICD-10-CM | POA: Insufficient documentation

## 2020-10-16 DIAGNOSIS — Z87891 Personal history of nicotine dependence: Secondary | ICD-10-CM | POA: Diagnosis not present

## 2020-10-16 DIAGNOSIS — M75102 Unspecified rotator cuff tear or rupture of left shoulder, not specified as traumatic: Secondary | ICD-10-CM | POA: Diagnosis not present

## 2020-10-16 HISTORY — PX: REVERSE SHOULDER ARTHROPLASTY: SHX5054

## 2020-10-16 SURGERY — ARTHROPLASTY, SHOULDER, TOTAL, REVERSE
Anesthesia: Regional | Site: Shoulder | Laterality: Left

## 2020-10-16 MED ORDER — ROCURONIUM BROMIDE 10 MG/ML (PF) SYRINGE
PREFILLED_SYRINGE | INTRAVENOUS | Status: AC
  Filled 2020-10-16: qty 10

## 2020-10-16 MED ORDER — BUPIVACAINE HCL (PF) 0.5 % IJ SOLN
INTRAMUSCULAR | Status: DC | PRN
  Administered 2020-10-16: 10 mL via PERINEURAL

## 2020-10-16 MED ORDER — TRANEXAMIC ACID-NACL 1000-0.7 MG/100ML-% IV SOLN
1000.0000 mg | INTRAVENOUS | Status: AC
  Administered 2020-10-16: 1000 mg via INTRAVENOUS
  Filled 2020-10-16: qty 100

## 2020-10-16 MED ORDER — PROPOFOL 10 MG/ML IV BOLUS
INTRAVENOUS | Status: DC | PRN
  Administered 2020-10-16: 130 mg via INTRAVENOUS
  Administered 2020-10-16: 40 mg via INTRAVENOUS

## 2020-10-16 MED ORDER — BUPIVACAINE LIPOSOME 1.3 % IJ SUSP
INTRAMUSCULAR | Status: DC | PRN
  Administered 2020-10-16: 10 mL via PERINEURAL

## 2020-10-16 MED ORDER — ROCURONIUM BROMIDE 100 MG/10ML IV SOLN
INTRAVENOUS | Status: DC | PRN
  Administered 2020-10-16: 100 mg via INTRAVENOUS

## 2020-10-16 MED ORDER — LACTATED RINGERS IV BOLUS
250.0000 mL | Freq: Once | INTRAVENOUS | Status: AC
  Administered 2020-10-16: 250 mL via INTRAVENOUS

## 2020-10-16 MED ORDER — ONDANSETRON HCL 4 MG/2ML IJ SOLN
INTRAMUSCULAR | Status: AC
  Filled 2020-10-16: qty 2

## 2020-10-16 MED ORDER — PHENYLEPHRINE HCL (PRESSORS) 10 MG/ML IV SOLN
INTRAVENOUS | Status: DC | PRN
  Administered 2020-10-16: 80 ug via INTRAVENOUS
  Administered 2020-10-16: 120 ug via INTRAVENOUS

## 2020-10-16 MED ORDER — ONDANSETRON HCL 4 MG/2ML IJ SOLN
INTRAMUSCULAR | Status: DC | PRN
  Administered 2020-10-16: 4 mg via INTRAVENOUS

## 2020-10-16 MED ORDER — LIDOCAINE HCL (CARDIAC) PF 100 MG/5ML IV SOSY
PREFILLED_SYRINGE | INTRAVENOUS | Status: DC | PRN
  Administered 2020-10-16: 20 mg via INTRAVENOUS

## 2020-10-16 MED ORDER — OXYCODONE-ACETAMINOPHEN 5-325 MG PO TABS: 1.0000 | ORAL_TABLET | ORAL | 0 refills | Status: DC | PRN

## 2020-10-16 MED ORDER — CYCLOBENZAPRINE HCL 10 MG PO TABS: 10.0000 mg | ORAL_TABLET | Freq: Three times a day (TID) | ORAL | 1 refills | Status: DC | PRN

## 2020-10-16 MED ORDER — ONDANSETRON HCL 4 MG PO TABS: 4.0000 mg | ORAL_TABLET | Freq: Three times a day (TID) | ORAL | 0 refills | Status: DC | PRN

## 2020-10-16 MED ORDER — PHENYLEPHRINE HCL-NACL 10-0.9 MG/250ML-% IV SOLN
INTRAVENOUS | Status: DC | PRN
  Administered 2020-10-16: 50 ug/min via INTRAVENOUS

## 2020-10-16 MED ORDER — SODIUM CHLORIDE 0.9 % IR SOLN
Status: DC | PRN
  Administered 2020-10-16: 1000 mL

## 2020-10-16 MED ORDER — PHENYLEPHRINE 40 MCG/ML (10ML) SYRINGE FOR IV PUSH (FOR BLOOD PRESSURE SUPPORT)
PREFILLED_SYRINGE | INTRAVENOUS | Status: AC
  Filled 2020-10-16: qty 10

## 2020-10-16 MED ORDER — ONDANSETRON HCL 4 MG/2ML IJ SOLN: 4.0000 mg | Freq: Once | INTRAMUSCULAR | Status: DC | PRN

## 2020-10-16 MED ORDER — CEFAZOLIN SODIUM-DEXTROSE 2-4 GM/100ML-% IV SOLN
2.0000 g | INTRAVENOUS | Status: AC
  Administered 2020-10-16: 2 g via INTRAVENOUS
  Filled 2020-10-16: qty 100

## 2020-10-16 MED ORDER — PROPOFOL 10 MG/ML IV BOLUS
INTRAVENOUS | Status: AC
  Filled 2020-10-16: qty 20

## 2020-10-16 MED ORDER — CHLORHEXIDINE GLUCONATE 0.12 % MT SOLN
15.0000 mL | Freq: Once | OROMUCOSAL | Status: AC
  Administered 2020-10-16: 15 mL via OROMUCOSAL

## 2020-10-16 MED ORDER — FENTANYL CITRATE (PF) 100 MCG/2ML IJ SOLN
INTRAMUSCULAR | Status: AC
  Filled 2020-10-16: qty 2

## 2020-10-16 MED ORDER — HYDROMORPHONE HCL 1 MG/ML IJ SOLN
INTRAMUSCULAR | Status: AC
  Filled 2020-10-16: qty 1

## 2020-10-16 MED ORDER — DEXAMETHASONE SODIUM PHOSPHATE 10 MG/ML IJ SOLN
INTRAMUSCULAR | Status: DC | PRN
  Administered 2020-10-16: 10 mg via INTRAVENOUS

## 2020-10-16 MED ORDER — HYDROMORPHONE HCL 1 MG/ML IJ SOLN
0.2500 mg | INTRAMUSCULAR | Status: DC | PRN
  Administered 2020-10-16 (×2): 0.25 mg via INTRAVENOUS

## 2020-10-16 MED ORDER — LACTATED RINGERS IV SOLN: INTRAVENOUS | Status: DC

## 2020-10-16 MED ORDER — OXYCODONE HCL 5 MG/5ML PO SOLN: 5.0000 mg | Freq: Once | ORAL | Status: DC | PRN

## 2020-10-16 MED ORDER — STERILE WATER FOR IRRIGATION IR SOLN
Status: DC | PRN
  Administered 2020-10-16: 2000 mL

## 2020-10-16 MED ORDER — MIDAZOLAM HCL 2 MG/2ML IJ SOLN
INTRAMUSCULAR | Status: AC
  Filled 2020-10-16: qty 2

## 2020-10-16 MED ORDER — LIDOCAINE 2% (20 MG/ML) 5 ML SYRINGE
INTRAMUSCULAR | Status: AC
  Filled 2020-10-16: qty 5

## 2020-10-16 MED ORDER — PHENYLEPHRINE HCL-NACL 10-0.9 MG/250ML-% IV SOLN
INTRAVENOUS | Status: AC
  Filled 2020-10-16: qty 750

## 2020-10-16 MED ORDER — FENTANYL CITRATE (PF) 100 MCG/2ML IJ SOLN
INTRAMUSCULAR | Status: DC | PRN
  Administered 2020-10-16: 100 ug via INTRAVENOUS

## 2020-10-16 MED ORDER — ORAL CARE MOUTH RINSE: 15.0000 mL | Freq: Once | OROMUCOSAL | Status: AC

## 2020-10-16 MED ORDER — SUGAMMADEX SODIUM 200 MG/2ML IV SOLN
INTRAVENOUS | Status: DC | PRN
  Administered 2020-10-16: 200 mg via INTRAVENOUS

## 2020-10-16 MED ORDER — DEXAMETHASONE SODIUM PHOSPHATE 10 MG/ML IJ SOLN
INTRAMUSCULAR | Status: AC
  Filled 2020-10-16: qty 1

## 2020-10-16 MED ORDER — OXYCODONE HCL 5 MG PO TABS: 5.0000 mg | ORAL_TABLET | Freq: Once | ORAL | Status: DC | PRN

## 2020-10-16 MED ORDER — LACTATED RINGERS IV BOLUS
500.0000 mL | Freq: Once | INTRAVENOUS | Status: AC
  Administered 2020-10-16: 500 mL via INTRAVENOUS

## 2020-10-16 SURGICAL SUPPLY — 68 items
BAG ZIPLOCK 12X15 (MISCELLANEOUS) ×3 IMPLANT
BLADE SAW SGTL 83.5X18.5 (BLADE) ×3 IMPLANT
COOLER ICEMAN CLASSIC (MISCELLANEOUS) IMPLANT
COVER BACK TABLE 60X90IN (DRAPES) ×3 IMPLANT
COVER SURGICAL LIGHT HANDLE (MISCELLANEOUS) ×3 IMPLANT
COVER WAND RF STERILE (DRAPES) IMPLANT
CUP SUT UNIV REVERS 39 NEU (Shoulder) ×3 IMPLANT
DERMABOND ADVANCED (GAUZE/BANDAGES/DRESSINGS) ×2
DERMABOND ADVANCED .7 DNX12 (GAUZE/BANDAGES/DRESSINGS) ×1 IMPLANT
DRAPE INCISE IOBAN 66X45 STRL (DRAPES) IMPLANT
DRAPE ORTHO SPLIT 77X108 STRL (DRAPES) ×4
DRAPE SHEET LG 3/4 BI-LAMINATE (DRAPES) ×3 IMPLANT
DRAPE SURG 17X11 SM STRL (DRAPES) ×3 IMPLANT
DRAPE SURG ORHT 6 SPLT 77X108 (DRAPES) ×2 IMPLANT
DRAPE U-SHAPE 47X51 STRL (DRAPES) ×3 IMPLANT
DRSG AQUACEL AG ADV 3.5X10 (GAUZE/BANDAGES/DRESSINGS) ×3 IMPLANT
DURAPREP 26ML APPLICATOR (WOUND CARE) ×3 IMPLANT
ELECT BLADE TIP CTD 4 INCH (ELECTRODE) ×3 IMPLANT
ELECT REM PT RETURN 15FT ADLT (MISCELLANEOUS) ×3 IMPLANT
FACESHIELD WRAPAROUND (MASK) ×12 IMPLANT
GLENOID UNI REV MOD 24 +2 LAT (Joint) ×3 IMPLANT
GLENOSPHERE 39+4 LAT/24 UNI RV (Joint) ×3 IMPLANT
GLOVE BIO SURGEON STRL SZ7.5 (GLOVE) ×3 IMPLANT
GLOVE BIO SURGEON STRL SZ8 (GLOVE) ×3 IMPLANT
GLOVE SS BIOGEL STRL SZ 7 (GLOVE) ×1 IMPLANT
GLOVE SS BIOGEL STRL SZ 7.5 (GLOVE) ×1 IMPLANT
GLOVE SUPERSENSE BIOGEL SZ 7 (GLOVE) ×2
GLOVE SUPERSENSE BIOGEL SZ 7.5 (GLOVE) ×2
GLOVE SURG SYN 7.0 (GLOVE) IMPLANT
GLOVE SURG SYN 7.5  E (GLOVE)
GLOVE SURG SYN 7.5 E (GLOVE) IMPLANT
GLOVE SURG SYN 8.0 (GLOVE) IMPLANT
GOWN STRL REUS W/TWL LRG LVL3 (GOWN DISPOSABLE) ×6 IMPLANT
INSERT HUMERAL MED 39/ +3 (Shoulder) ×1 IMPLANT
INSERT MEDIUM HUMERAL 39/ +3 (Shoulder) ×2 IMPLANT
KIT BASIN OR (CUSTOM PROCEDURE TRAY) ×3 IMPLANT
KIT TURNOVER KIT A (KITS) IMPLANT
MANIFOLD NEPTUNE II (INSTRUMENTS) ×3 IMPLANT
NEEDLE TAPERED W/ NITINOL LOOP (MISCELLANEOUS) ×3 IMPLANT
NS IRRIG 1000ML POUR BTL (IV SOLUTION) ×3 IMPLANT
PACK SHOULDER (CUSTOM PROCEDURE TRAY) ×3 IMPLANT
PAD ARMBOARD 7.5X6 YLW CONV (MISCELLANEOUS) ×3 IMPLANT
PAD COLD SHLDR WRAP-ON (PAD) ×3 IMPLANT
PIN NITINOL TARGETER 2.8 (PIN) IMPLANT
PIN SET MODULAR GLENOID SYSTEM (PIN) IMPLANT
RESTRAINT HEAD UNIVERSAL NS (MISCELLANEOUS) ×3 IMPLANT
SCREW CENTRAL MOD 30MM (Screw) ×3 IMPLANT
SCREW PERI LOCK 5.5X24 (Screw) ×6 IMPLANT
SCREW PERI LOCK 5.5X32 (Screw) ×3 IMPLANT
SCREW PERIPHERAL 5.5X28 LOCK (Screw) ×3 IMPLANT
SLING ARM FOAM STRAP LRG (SOFTGOODS) ×6 IMPLANT
SLING ARM FOAM STRAP MED (SOFTGOODS) IMPLANT
SPACER REVERSE UNI 39/ +6MM (Shoulder) ×1 IMPLANT
SPACER SHLD UNI REV 39 +6 (Shoulder) ×2 IMPLANT
SPONGE LAP 18X18 RF (DISPOSABLE) IMPLANT
STEM HUMERAL UNI REVERS SZ9 (Stem) ×3 IMPLANT
SUCTION FRAZIER HANDLE 12FR (TUBING) ×2
SUCTION TUBE FRAZIER 12FR DISP (TUBING) ×1 IMPLANT
SUT FIBERWIRE #2 38 T-5 BLUE (SUTURE)
SUT MNCRL AB 3-0 PS2 18 (SUTURE) ×3 IMPLANT
SUT MON AB 2-0 CT1 36 (SUTURE) ×3 IMPLANT
SUT VIC AB 1 CT1 36 (SUTURE) ×3 IMPLANT
SUTURE FIBERWR #2 38 T-5 BLUE (SUTURE) IMPLANT
SUTURE TAPE 1.3 40 TPR END (SUTURE) ×2 IMPLANT
SUTURETAPE 1.3 40 TPR END (SUTURE) ×6
TOWEL OR 17X26 10 PK STRL BLUE (TOWEL DISPOSABLE) ×3 IMPLANT
TOWEL OR NON WOVEN STRL DISP B (DISPOSABLE) ×3 IMPLANT
WATER STERILE IRR 1000ML POUR (IV SOLUTION) ×6 IMPLANT

## 2020-10-16 NOTE — Op Note (Signed)
10/16/2020  9:32 AM  PATIENT:   [name] [name]  79 y.o. male  PRE-OPERATIVE DIAGNOSIS: Severe left shoulder osteoarthritis with associated significant bony deformity and rotator cuff dysfunction  POST-OPERATIVE DIAGNOSIS: Same  PROCEDURE: Left shoulder reverse arthroplasty utilizing a press-fit size 9 Arthrex stem with a +6 spacer, +3 polyethylene insert, 39/+4 glenosphere on a small/+2 baseplate  SURGEON:  Dustyn Armbrister, Metta Clines M.D.  ASSISTANTS: Jenetta Loges, PA-C  ANESTHESIA:   General endotracheal and interscalene block with Exparel  EBL: 200 cc  SPECIMEN: None  Drains: None   PATIENT DISPOSITION:  PACU - hemodynamically stable.    PLAN OF CARE: Discharge to home after PACU  Brief history:  Patient is a 79 year old male who has had chronic and progressively increasing left shoulder pain related to advanced osteoarthritis with associated bony deformity.  He is now experiencing a severe impact on quality of life with functional mutations and unrelenting pain.  He is brought to the operating this time for planned left shoulder reverse arthroplasty.  Preoperatively, I counseled the patient regarding treatment options and risks versus benefits thereof.  Possible surgical complications were all reviewed including potential for bleeding, infection, neurovascular injury, persistent pain, loss of motion, anesthetic complication, failure of the implant, and possible need for additional surgery. They understand and accept and agrees with our planned procedure.   Procedure in detail:  After undergoing routine preop evaluation the patient received prophylactic antibiotics and interscalene block with Exparel established in the holding area by the anesthesia department.  Patient was subsequently placed supine on the operative table and underwent the smooth induction of a general endotracheal anesthesia.  Subsequently placed into the beachchair position and appropriately padded and  protected.  The left shoulder girdle region was sterilely prepped and draped in standard fashion.  Timeout was called.  Of note the patient had profoundly restricted mobility with only 45 of abduction and 10 of rotation.  An anterior deltopectoral approach left shoulder is made through a 10 cm incision.  Dissection carried deeply to the deltopectoral interval and electrocautery was used for hemostasis.  The deltopectoral interval was then developed from proximal to distal with the vein taken laterally in the upper centimeter of the pectoralis major was tenotomized for exposure and the conjoined tendon was mobilized retracted medially and adhesions were divided beneath the deltoid.  The long head biceps tendon was then tenodesed at the upper border of the pectoralis major with the proximal segment unroofed and excised.  The rotator cuff was then split along the rotator interval and the subscapularis was then divided from its lesser tuberosity insertion and tagged with a pair of suture tape sutures.  The capsular attachment from the anterior inferior margins of the humeral neck were then carefully elevated in a subperiosteal fashion there was a very large osteophyte emanating off the inferior aspect of the humeral neck which was carefully dissected free and ultimately allow deliver the humeral head through the wound.  We outlined our proposed humeral head resection with the extra medullary guide at approximate 20 retroversion the head was excised with an oscillating saw.  We then used an osteotome to remove the remnant of the large osteophyte on the inferior margin of the humeral neck trimming the bone back to the native contours and cortical margins.  A metal cap was then placed over the cut proximal humeral surface we then exposed the glenoid.  There is very little remaining labral tissue.  Proximal remnant of the bicep was excised.  There was  moderate retroversion and this was corrected as it was increased  inclination of approximately 5 inferior.  The glenoid was then prepared with the central followed by the peripheral reamers and then we used a rondure to remove the large osteophytes on the anterior and infra margins of the humeral neck.  Terminal preparation of the glenoid was completed and a 30 mm lag screw was assembled to the small/+2 baseplate.  The baseplate was then introduced with excellent fit and fixation.  The peripheral locking screws were all then placed achieving excellent purchase and fixation.  The 39/+4 glenosphere was then impacted onto the baseplate and the central locking screw was placed with good fixation.  We then returned our attention back to the proximal humerus where the canal was opened and reaming to size 7 broaching up to a size 9 with then subsequently a neutral metaphyseal reamer.  A trial implant was placed showing good motion good stability good soft tissue balance.  Our final implant was assembled on the back table and then impacted with excellent fit and fixation.  We then performed a series of trial reductions and ultimately felt that a total of +9 off the implant gave [name] the best soft tissue balance.  A +6 spacer followed by +3 polywas then terminally placed.  Final reduction was performed in the skin gave [name] excellent motion good stability good soft tissue balance.  The joint was copiously irrigated.  Some additional large loose osteocartilaginous bodies were removed from the peritubular soft tissues including the large wound in the posterior recess as well as in the subscapularis recess and an extensive synovectomy was completed as well.  We then mobilized the subscapularis we did feel that there was viable tissues and so we did repair the subscap back to the eyelets on the collar of the implant.  Final irrigation was then completed.  The deltopectoral interval was reapproximated with a series of figure-of-eight #1 Vicryl sutures.  2-0 Vicryl used for subcu layer and  intracuticular 3-0 Monocryl for the skin followed by Dermabond and Aquacel dressing.  Left nose placed in sling.  Patient was awakened, extubated, and taken to recovery in stable condition.  Jenetta Loges, PA-C was utilized as an Environmental consultant throughout this case, essential for help with positioning the patient, positioning extremity, tissue manipulation, implantation of the prosthesis, suture management, wound closure, and intraoperative decision-making.  Marin Shutter MD   Contact # [phone]

## 2020-10-16 NOTE — Anesthesia Postprocedure Evaluation (Signed)
Anesthesia Post Note  Patient: [name] [name]  Procedure(s) Performed: REVERSE SHOULDER ARTHROPLASTY (Left Shoulder)     Patient location during evaluation: PACU Anesthesia Type: Regional and General Level of consciousness: awake and alert, oriented and patient cooperative Pain management: pain level controlled Vital Signs Assessment: post-procedure vital signs reviewed and stable Respiratory status: spontaneous breathing, nonlabored ventilation and respiratory function stable Cardiovascular status: blood pressure returned to baseline and stable Postop Assessment: no apparent nausea or vomiting Anesthetic complications: no   No complications documented.  Last Vitals:  Vitals:   10/16/20 1000 10/16/20 1015  BP: 108/80 122/62  Pulse: 60 (!) 57  Resp: 17 14  Temp:    SpO2: 95% 94%    Last Pain:  Vitals:   10/16/20 1015  TempSrc:   PainSc: [place]

## 2020-10-16 NOTE — Anesthesia Procedure Notes (Signed)
Procedure Name: Intubation Date/Time: 10/16/2020 7:39 AM Performed by: Lieutenant Diego, CRNA Pre-anesthesia Checklist: Patient identified, Emergency Drugs available, Suction available and Patient being monitored Patient Re-evaluated:Patient Re-evaluated prior to induction Oxygen Delivery Method: Circle system utilized Preoxygenation: Pre-oxygenation with 100% oxygen Induction Type: IV induction Ventilation: Oral airway inserted - appropriate to patient size Laryngoscope Size: Glidescope Grade View: Grade I Tube type: Oral Tube size: 7.5 mm Number of attempts: 1 Placement Confirmation: ETT inserted through vocal cords under direct vision,  breath sounds checked- equal and bilateral and positive ETCO2 Secured at: 23 cm Tube secured with: Tape Dental Injury: Teeth and Oropharynx as per pre-operative assessment  Comments: Performed by Jacqualine Code

## 2020-10-16 NOTE — Discharge Instructions (Signed)
 Kevin M. Supple, M.D., F.A.A.O.S. Orthopaedic Surgery Specializing in Arthroscopic and Reconstructive Surgery of the Shoulder [phone] [address] - [place], [name] [address] - Fax [phone]   POST-OP TOTAL SHOULDER REPLACEMENT INSTRUCTIONS  1. Follow up in the office for your first post-op appointment 10-14 days from the date of your surgery. If you do not already have a scheduled appointment, our office will contact you to schedule.  2. The bandage over your incision is waterproof. You may begin showering with this dressing on. You may leave this dressing on until first follow up appointment within 2 weeks. We prefer you leave this dressing in place until follow up however after 5-7 days if you are having itching or skin irritation and would like to remove it you may do so. Go slow and tug at the borders gently to break the bond the dressing has with the skin. At this point if there is no drainage it is okay to go without a bandage or you may cover it with a light guaze and tape. You can also expect significant bruising around your shoulder that will drift down your arm and into your chest wall. This is very normal and should resolve over several days.   3. Wear your sling/immobilizer at all times except to perform the exercises below or to occasionally let your arm dangle by your side to stretch your elbow. You also need to sleep in your sling immobilizer until instructed otherwise. It is ok to remove your sling if you are sitting in a controlled environment and allow your arm to rest in a position of comfort by your side or on your lap with pillows to give your neck and skin a break from the sling. You may remove it to allow arm to dangle by side to shower. If you are up walking around and when you go to sleep at night you need to wear it.  4. Range of motion to your elbow, wrist, and hand are encouraged 3-5 times daily. Exercise to your hand and fingers helps to reduce  swelling you may experience.   5. Prescriptions for a pain medication and a muscle relaxant are provided for you. It is recommended that if you are experiencing pain that you pain medication alone is not controlling, add the muscle relaxant along with the pain medication which can give additional pain relief. The first 1-2 days is generally the most severe of your pain and then should gradually decrease. As your pain lessens it is recommended that you decrease your use of the pain medications to an "as needed basis'" only and to always comply with the recommended dosages of the pain medications.  6. Pain medications can produce constipation along with their use. If you experience this, the use of an over the counter stool softener or laxative daily is recommended.   7. For additional questions or concerns, please do not hesitate to call the office. If after hours there is an answering service to forward your concerns to the physician on call.  8.Pain control following an exparel block  To help control your post-operative pain you received a nerve block  performed with Exparel which is a long acting anesthetic (numbing agent) which can provide pain relief and sensations of numbness (and relief of pain) in the operative shoulder and arm for up to 3 days. Sometimes it provides mixed relief, meaning you may still have numbness in certain areas of the arm but can still be able to   move  parts of that arm, hand, and fingers. We recommend that your prescribed pain medications  be used as needed. We do not feel it is necessary to "pre medicate" and "stay ahead" of pain.  Taking narcotic pain medications when you are not having any pain can lead to unnecessary and potentially dangerous side effects.    9. Use the ice machine as much as possible in the first 5-7 days from surgery, then you can wean its use to as needed. The ice typically needs to be replaced every 6 hours, instead of ice you can actually freeze  water bottles to put in the cooler and then fill water around them to avoid having to purchase ice. You can have spare water bottles freezing to allow you to rotate them once they have melted. Try to have a thin shirt or light cloth or towel under the ice wrap to protect your skin.   10.  We recommend that you avoid any dental work or cleaning in the first 3 months following your joint replacement. This is to help minimize the possibility of infection from the bacteria in your mouth that enters your bloodstream during dental work. We also recommend that you take an antibiotic prior to your dental work for the first year after your shoulder replacement to further help reduce that risk. Please simply contact our office for antibiotics to be sent to your pharmacy prior to dental work.  11. Dental Antibiotics:  In most cases prophylactic antibiotics for Dental procdeures after total joint surgery are not necessary.  Exceptions are as follows:  1. History of prior total joint infection  2. Severely immunocompromised (Organ Transplant, cancer chemotherapy, Rheumatoid biologic meds such as Humera)  3. Poorly controlled diabetes (A1C &gt; 8.0, blood glucose over 200)  If you have one of these conditions, contact your surgeon for an antibiotic prescription, prior to your dental procedure.   POST-OP EXERCISES  Pendulum Exercises  Perform pendulum exercises while standing and bending at the waist. Support your uninvolved arm on a table or chair and allow your operated arm to hang freely. Make sure to do these exercises passively - not using you shoulder muscles. These exercises can be performed once your nerve block effects have worn off.  Repeat 20 times. Do 3 sessions per day.     

## 2020-10-16 NOTE — Evaluation (Signed)
Occupational Therapy Evaluation Patient Details Name: [name] [name] MRN: [id number] DOB: [birth year] Today's Date: 10/16/2020    History of Present Illness Pt is s/p LT reverse Total Shoulder replacement on 10/16/20.   Clinical Impression   Pt is a 79 year old male, s/p reverse shoulder replacement without functional use of Left non-dominant upper extremity secondary to effects of surgery and interscalene block and shoulder precautions. Therapist provided education and instruction to patient in regards to exercises, precautions, positioning, donning upper extremity clothing and bathing while maintaining shoulder precautions, ice and edema management and donning/doffing sling. Patient verbalized understanding and demonstrated as needed. Patient needed assistance to donn shirt, and socks.  Discussed easy foot wear, and provided with instruction on compensatory strategies to perform ADLs. Verbally instructed in underwear/pants due to RT ankle IV.  Patient limited by decreased ROM in LT shoulder so therefore will need some form of assistance at home. Patient verbalized 24/7 assistance will be available from his spouse, and verbalized and/or demonstrated understanding to all instruction. Patient to follow up with MD for further therapy needs.     Follow Up Recommendations  Follow surgeon's recommendation for DC plan and follow-up therapies    Equipment Recommendations       Recommendations for Other Services       Precautions / Restrictions Precautions Precautions: Shoulder Type of Shoulder Precautions: If sitting in controlled environment, ok to come out of sling to give neck a break. Please sleep in it to protect until follow up in office. OK to use operative arm for feeding, hygiene and ADLs. Ok to instruct Pendulums and lap slides as exercises. Ok to use operative arm within the following parameters for ADL purposes New ROM (8/18) Ok for PROM, AAROM, AROM within pain tolerance and within  the following ROM ER 20 ABD 45 FE 60 Shoulder Interventions: Shoulder sling/immobilizer;Off for dressing/bathing/exercises Precaution Booklet Issued: Yes (comment) Required Braces or Orthoses: Sling      Mobility Bed Mobility                    Transfers                      Balance Overall balance assessment: Needs assistance Sitting-balance support: No upper extremity supported Sitting balance-Leahy Scale: Fair Sitting balance - Comments: supervision. Limited testing due to RT ankle IV                                   ADL either performed or assessed with clinical judgement   ADL Overall ADL's : Needs assistance/impaired Eating/Feeding: Set up Eating/Feeding Details (indicate cue type and reason): With dominant RT hand Grooming: Set up   Upper Body Bathing: Minimal assistance Upper Body Bathing Details (indicate cue type and reason): Educated on dangle technique and to wait until surgeon's instructions as well as have supervision. Lower Body Bathing: Minimal assistance;Supervison/ safety   Upper Body Dressing : Moderate assistance;Sitting   Lower Body Dressing: Minimal assistance Lower Body Dressing Details (indicate cue type and reason): Deferred due to RT ankle IV. Pt instructed.             Functional mobility during ADLs: Supervision/safety       Vision Baseline Vision/History: No visual deficits Vision Assessment?: No apparent visual deficits     Perception     Praxis      Pertinent Vitals/Pain Pain Assessment: No/denies pain  Hand Dominance Right   Extremity/Trunk Assessment Upper Extremity Assessment Upper Extremity Assessment: LUE deficits/detail RUE Deficits / Details: Pt able to demonstrate lap slides with compensatory finger movement due to still feeling numb from nerve block. RUE: Unable to fully assess due to immobilization       Cervical / Trunk Assessment Cervical / Trunk Assessment: Normal    Communication Communication Communication: No difficulties   Cognition Arousal/Alertness: Awake/alert Behavior During Therapy: WFL for tasks assessed/performed Overall Cognitive Status: Within Functional Limits for tasks assessed                                     General Comments       Exercises Shoulder Exercises Pendulum Exercise:  (Pt instructed and with handout.) Shoulder Flexion:  (Ind demo of lap slides. Verb understanding to all ROM parameters) Shoulder Extension: Other (comment) (Pt instructed NO Shoulder Extension per RTSA precautions) Shoulder ABduction:  (Pt instructed in parameters and able to demo AAROM to ~20 degrees) Shoulder External Rotation:  (Pt able to bring passively to neutral and instructed on parameters) Elbow Flexion: PROM Elbow Extension: PROM Wrist Flexion: AAROM Wrist Extension: AAROM Digit Composite Flexion: AAROM Composite Extension: AAROM   Shoulder Instructions Shoulder Instructions Donning/doffing shirt without moving shoulder: Patient able to independently direct caregiver;Moderate assistance Method for sponge bathing under operated UE: Min-guard Donning/doffing sling/immobilizer: Moderate assistance;Patient able to independently direct caregiver Correct positioning of sling/immobilizer: Patient able to independently direct caregiver;Minimal assistance Pendulum exercises (written home exercise program): Patient able to independently direct caregiver ROM for elbow, wrist and digits of operated UE: Patient able to independently direct caregiver Sling wearing schedule (on at all times/off for ADL's): Patient able to independently direct caregiver;Independent Proper positioning of operated UE when showering: Patient able to independently direct caregiver Positioning of UE while sleeping: Independent;Patient able to independently direct caregiver    Home Living Family/patient expects to be discharged to:: Private residence Living  Arrangements: Spouse/significant other Available Help at Discharge: Available 24 hours/day Type of Home: House Home Access: Stairs to enter [employer] of Steps: 2 Entrance Stairs-Rails: Right Home Layout: One level                   Additional Comments: Long shoe horn      Prior Functioning/Environment Level of Independence: Independent                 OT Problem List: Decreased range of motion;Decreased strength      OT Treatment/Interventions:      OT Goals(Current goals can be found in the care plan section) Acute Rehab OT Goals Patient Stated Goal: Go home OT Goal Formulation: With patient Time For Goal Achievement: 10/16/20 Potential to Achieve Goals: Good ADL Goals Additional ADL Goal #1: Pt will verbalize understanding and/or demonstrate UE/LE dressing, donning/doffing of sling, correct positioning of LT UE, and compensatory strategies for LT  axilla hygiene, all while correctly following all shoulder post-op precautions/restrictions.  OT Frequency:     Barriers to D/C:            Co-evaluation              AM-PAC OT "6 Clicks" Daily Activity     Outcome Measure Help from another person eating meals?: None Help from another person taking care of personal grooming?: A Little Help from another person toileting, which includes using toliet, bedpan, or urinal?: A Little Help  from another person bathing (including washing, rinsing, drying)?: A Little Help from another person to put on and taking off regular upper body clothing?: A Lot Help from another person to put on and taking off regular lower body clothing?: A Little 6 Click Score: 18   End of Session Nurse Communication:  (OT intervention completed)  Activity Tolerance: Patient tolerated treatment well Patient left: in chair;with call bell/phone within reach  OT Visit Diagnosis: Muscle weakness (generalized) (M62.81)                Time: 6644-0347 OT Time Calculation (min):  47 min Charges:  OT General Charges $OT Visit: 1 Visit OT Evaluation $OT Eval Low Complexity: 1 Low OT Treatments $Self Care/Home Management : 8-22 mins $Therapeutic Exercise: 8-22 mins  Anderson Malta, OT Acute Rehab Services Office: [phone] 10/16/2020  Julien Girt 10/16/2020, 1:07 PM

## 2020-10-16 NOTE — Anesthesia Procedure Notes (Signed)
Anesthesia Regional Block: Interscalene brachial plexus block   Pre-Anesthetic Checklist: ,, timeout performed, Correct Patient, Correct Site, Correct Laterality, Correct Procedure, Correct Position, site marked, Risks and benefits discussed,  Surgical consent,  Pre-op evaluation,  At surgeon's request and post-op pain management  Laterality: Left  Prep: Maximum Sterile Barrier Precautions used, chloraprep       Needles:  Injection technique: Single-shot  Needle Type: Echogenic Stimulator Needle     Needle Length: 9cm  Needle Gauge: 22     Additional Needles:   Procedures:,,,, ultrasound used (permanent image in chart),,,,  Narrative:  Start time: 10/16/2020 7:00 AM End time: 10/16/2020 7:06 AM Injection made incrementally with aspirations every 5 mL.  Performed by: Personally  Anesthesiologist: Pervis Hocking, DO  Additional Notes: Monitors applied. No increased pain on injection. No increased resistance to injection. Injection made in 5cc increments. Good needle visualization. Patient tolerated procedure well.

## 2020-10-16 NOTE — H&P (Signed)
[name] [name]    Chief Complaint: left shoulder osteoarthritis HPI: The patient is a 79 y.o. male with chronic left shoulder pain and increasing functional limitatins related to end stage OA  Past Medical History:  Diagnosis Date  . Atrial fibrillation ([place])   . BPH (benign prostatic hypertrophy)   . Bradycardia 12/30/2014   HR routinely in mid 40s-50s  . Coronary artery disease (CAD) excluded April 2016   False-positive nuclear stress test suggesting inferior ischemia  . Dysrhythmia   . Elevated PSA   . Encounter for Medicare annual wellness exam 09/22/2013   Sees Dr Wilhemina Bonito of Derm Sees Dr Silvano Rusk of Gastroenterology Sees Dr Kathie Rhodes of [facility] Sees Dr Susa Day of Optometry  . GERD (gastroesophageal reflux disease)    controlled w/ diet and behavioral changes, history of  . Grade I diastolic dysfunction 9563  . History of kidney stones   . Hypertriglyceridemia 03/16/2015  . Lymphadenitis   . Melanoma of back ([place])    Excised Dr Wilhemina Bonito  . Mild ascending aorta dilatation ([facility])   . New onset a-fib ([place]) 03/10/2015  . OA (osteoarthritis)   . Otitis, externa, infective 08/10/2015  . Palpitations   . Post herpetic neuralgia   . Shingles 07/09/2013  . Sleep apnea 07/30/2011   cpap- 13   . Snoring disorder 07/30/2011    Past Surgical History:  Procedure Laterality Date  . CATARACT EXTRACTION Right   . COLONOSCOPY  2018  . CYSTOSCOPY/URETEROSCOPY/HOLMIUM LASER/STENT PLACEMENT Left 10/05/2019   Procedure: CYSTOSCOPY/RETROGRADE/URETEROSCOPY/HOLMIUM LASER/STENT PLACEMENT;  Surgeon: Kathie Rhodes, MD;  Location: [facility];  Service: Urology;  Laterality: Left;  . EYE SURGERY Left 12/06/2017   cataract by Dr Gillian Scarce  . INGUINAL HERNIA REPAIR Bilateral 06/01/2019   Procedure: LAPAROSCOPIC LEFT AND  RIGHT INGUINAL HERNIA REPAIR WITH MESH;  Surgeon: Michael Boston, MD;  Location: [facility];  Service: General;  Laterality: Bilateral;    . LEFT HEART CATHETERIZATION WITH CORONARY ANGIOGRAM N/A 04/10/2015   Procedure: LEFT HEART CATHETERIZATION WITH  CORONARY ANGIOGRAM;  Surgeon: Leonie Man, MD;  Location: [facility] CATH LAB;  Service: Cardiovascular;  Angiographicallly NORMAL CORONARY ARTERIES  . MASS EXCISION N/A 05/16/2014   Procedure: EXCISION POSTERIOR NECK MASS, RIGHT CHEST WALL MASS AND RIGHT AXILLARY MASS AXILLARY NODE DISSECTION;  Surgeon: Adin Hector, MD;  Location: [facility];  Service: General;  Laterality: N/A;  . NM MYOVIEW LTD  03/19/2015   FALSE POSITIVE:  INTERMEDIATE RISK. Small sized, moderate intensity inferior ischemic perfusion defect  . TRANSTHORACIC ECHOCARDIOGRAM  03/20/2015   Normal LV size and function. EF 60-65%. G1 DD. Trivial AI. Borderline aortic root dilation (41 mm), Mild LA dilation.    Family History  Problem Relation Age of Onset  . Cancer Mother 58       MM, leukemia  . Obesity Mother   . Emphysema Father 20  . Obesity Father   . Alcohol abuse Father   . Cancer Sister 74       brain  . COPD Brother 90  . Heart disease Paternal Grandmother   . COPD Paternal Grandfather   . Diabetes Sister 45  . Obesity Sister   . Down syndrome Brother 40       aspirated  . Alcohol abuse Other   . Colon cancer Neg Hx     Social History:  reports that he quit smoking about 31 years ago. His smoking use included pipe and cigars. He quit  after 27.00 years of use. He has never used smokeless tobacco. He reports current alcohol use of about 7.0 standard drinks of alcohol per week. He reports that he does not use drugs.   Medications Prior to Admission  Medication Sig Dispense Refill  . Cholecalciferol (VITAMIN D3) 125 MCG (5000 UT) CAPS Take 1 capsule (5,000 Units total) by mouth daily. 30 capsule 0  . ELIQUIS 5 MG TABS tablet Take 1 tablet by mouth twice daily (Patient taking differently: Take 5 mg by mouth. ) 180 tablet 0  . gabapentin (NEURONTIN) 300 MG capsule Take 3 capsules (900 mg total) by  mouth 3 (three) times daily. 270 capsule 3  . Multiple Vitamin (MULTIVITAMIN WITH MINERALS) TABS tablet Take 1 tablet by mouth daily.        Physical Exam: Left shoulder with painful and severely restricted motion as noted at recent office visits  Vitals  Temp:  [97.6 F (36.4 C)] 97.6 F (36.4 C) (10/21 0618) Pulse Rate:  [80] 80 (10/21 0618) Resp:  [14] 14 (10/21 0618) BP: (120)/(74) 120/74 (10/21 0618) SpO2:  [100 %] 100 % (10/21 0618) Weight:  [99.8 kg] 99.8 kg (10/21 0540)  Assessment/Plan  Impression: left shoulder osteoarthritis  Plan of Action: Procedure(s): REVERSE SHOULDER ARTHROPLASTY  [name] [name] 10/16/2020, 6:50 AM Contact # [phone]

## 2020-10-16 NOTE — Transfer of Care (Signed)
Immediate Anesthesia Transfer of Care Note  Patient: [name] [name]  Procedure(s) Performed: REVERSE SHOULDER ARTHROPLASTY (Left Shoulder)  Patient Location: PACU  Anesthesia Type:GA combined with regional for post-op pain  Level of Consciousness: drowsy and responds to stimulation  Airway & Oxygen Therapy: Patient Spontanous Breathing and Patient connected to face mask oxygen  Post-op Assessment: Report given to RN and Post -op Vital signs reviewed and stable  Post vital signs: Reviewed and stable  Last Vitals:  Vitals Value Taken Time  BP 106/66 10/16/20 0933  Temp    Pulse 81 10/16/20 0935  Resp 19 10/16/20 0935  SpO2 99 % 10/16/20 0935  Vitals shown include unvalidated device data.  Last Pain:  Vitals:   10/16/20 0634  TempSrc:   PainSc: 0-No pain         Complications: No complications documented.

## 2020-10-17 ENCOUNTER — Encounter (HOSPITAL_COMMUNITY): Payer: Self-pay | Admitting: Orthopedic Surgery

## 2020-10-20 NOTE — Progress Notes (Signed)
Chief Complaint:   OBESITY [name] [name] is here to discuss his progress with his obesity treatment plan along with follow-up of his obesity related diagnoses. [name] [name] is on the Category 3 Plan and states he is following his eating plan approximately 75% of the time. [name] states he is doing 0 minutes 0 times per week.  Today's visit was #: 83 Starting weight: 237 lbs Starting date: 08/18/2017 Today's weight: 213 lbs Today's date: 10/08/2020 Total lbs lost to date: 24 Total lbs lost since last in-office visit: 0  Interim History: [name] [name] has been on vacation, and he has done well being mindful and trying to eat more vegetables and lean protein. He is ready to get back on track.  Subjective:   1. Pre-diabetes [name] [name] has done well with diet and weight loss. He is not on medications, and he feels well overall.  2. Vitamin D deficiency [name] [name] is on OTC Vit D, and his last Vit D level was not at goal. He denies nausea or vomiting.  Assessment/Plan:   1. Pre-diabetes [name] [name] will continue to work on weight loss, diet, exercise, and decreasing simple carbohydrates to help decrease the risk of diabetes. We will recheck labs in 8 weeks.  2. Vitamin D deficiency Low Vitamin D level contributes to fatigue and are associated with obesity, breast, and colon cancer. Trace agreed to continue taking OTC Vitamin D, and we will recheck labs in 8 weeks. He will follow-up for routine testing of Vitamin D, at least 2-3 times per year to avoid over-replacement.  3. Class 1 obesity with serious comorbidity and body mass index (BMI) of 31.0 to 31.9 in adult, unspecified obesity type [name] [name] is currently in the action stage of change. As such, his goal is to continue with weight loss efforts. He has agreed to the Category 3 Plan.   Behavioral modification strategies: increasing lean protein intake, increasing vegetables, decreasing eating out and meal planning and cooking strategies.  [name] [name] has agreed to follow-up  with our clinic in 8 weeks. He was informed of the importance of frequent follow-up visits to maximize his success with intensive lifestyle modifications for his multiple health conditions.   Objective:   Blood pressure 111/71, pulse 100, temperature 97.8 F (36.6 C), height 5\' 9"  (1.753 m), weight 213 lb (96.6 kg), SpO2 99 %. Body mass index is 31.45 kg/m.  General: Cooperative, alert, well developed, in no acute distress. HEENT: Conjunctivae and lids unremarkable. Cardiovascular: Regular rhythm.  Lungs: Normal work of breathing. Neurologic: No focal deficits.   Lab Results  Component Value Date   CREATININE 0.98 10/10/2020   BUN 25 (H) 10/10/2020   NA 137 10/10/2020   K 4.3 10/10/2020   CL 106 10/10/2020   CO2 24 10/10/2020   Lab Results  Component Value Date   ALT 15 09/11/2020   AST 15 09/11/2020   ALKPHOS 63 07/23/2020   BILITOT 1.1 09/11/2020   Lab Results  Component Value Date   HGBA1C 5.5 07/23/2020   HGBA1C 5.6 04/21/2020   HGBA1C 5.7 (H) 12/31/2019   HGBA1C 5.5 09/10/2019   HGBA1C 5.8 (H) 12/05/2018   Lab Results  Component Value Date   INSULIN 5.2 07/23/2020   INSULIN 5.1 04/21/2020   INSULIN 10.5 12/31/2019   INSULIN 27.4 (H) 09/10/2019   INSULIN 6.7 12/05/2018   Lab Results  Component Value Date   TSH 1.51 09/11/2020   Lab Results  Component Value Date   CHOL 151 07/23/2020   HDL 54 07/23/2020  LDLCALC 81 07/23/2020   TRIG 82 07/23/2020   CHOLHDL 3 10/11/2019   Lab Results  Component Value Date   WBC 6.4 10/10/2020   HGB 14.4 10/10/2020   HCT 42.6 10/10/2020   MCV 96.8 10/10/2020   PLT 172 10/10/2020   No results found for: IRON, TIBC, FERRITIN  Attestation Statements:   Reviewed by clinician on day of visit: allergies, medications, problem list, medical history, surgical history, family history, social history, and previous encounter notes.  Time spent on visit including pre-visit chart review and post-visit care and charting was  32 minutes.    I, Trixie Dredge, am acting as transcriptionist for Dennard Nip, MD.  I have reviewed the above documentation for accuracy and completeness, and I agree with the above. -  Dennard Nip, MD

## 2020-10-22 ENCOUNTER — Other Ambulatory Visit: Payer: Self-pay | Admitting: Cardiology

## 2020-10-24 DIAGNOSIS — Z96612 Presence of left artificial shoulder joint: Secondary | ICD-10-CM | POA: Insufficient documentation

## 2020-10-29 DIAGNOSIS — Z471 Aftercare following joint replacement surgery: Secondary | ICD-10-CM | POA: Diagnosis not present

## 2020-10-29 DIAGNOSIS — G4733 Obstructive sleep apnea (adult) (pediatric): Secondary | ICD-10-CM | POA: Diagnosis not present

## 2020-10-29 DIAGNOSIS — Z96612 Presence of left artificial shoulder joint: Secondary | ICD-10-CM | POA: Diagnosis not present

## 2020-11-14 ENCOUNTER — Other Ambulatory Visit: Payer: Self-pay | Admitting: Family Medicine

## 2020-11-14 ENCOUNTER — Encounter: Payer: Self-pay | Admitting: Physical Therapy

## 2020-11-14 ENCOUNTER — Ambulatory Visit: Payer: Medicare Other | Attending: Orthopedic Surgery | Admitting: Physical Therapy

## 2020-11-14 ENCOUNTER — Other Ambulatory Visit: Payer: Self-pay

## 2020-11-14 DIAGNOSIS — R600 Localized edema: Secondary | ICD-10-CM | POA: Diagnosis not present

## 2020-11-14 DIAGNOSIS — M25612 Stiffness of left shoulder, not elsewhere classified: Secondary | ICD-10-CM | POA: Insufficient documentation

## 2020-11-14 DIAGNOSIS — M25512 Pain in left shoulder: Secondary | ICD-10-CM | POA: Diagnosis not present

## 2020-11-14 NOTE — Therapy (Signed)
[place]. [place], [place], [address] Phone: [phone]   Fax:  [phone]  Physical Therapy Evaluation  Patient Details  Name: [name] [name] MRN: [id number] Date of Birth: [birth year] Referring Provider (PT): Supple   Encounter Date: 11/14/2020   PT End of Session - 11/14/20 0951    Visit Number 1    Date for PT Re-Evaluation 01/14/21    Authorization Type [employer]    PT Start Time 0925    PT Stop Time 1008    PT Time Calculation (min) 43 min    Activity Tolerance Patient tolerated treatment well    Behavior During Therapy [facility] for tasks assessed/performed           Past Medical History:  Diagnosis Date   Atrial fibrillation ([place])    BPH (benign prostatic hypertrophy)    Bradycardia 12/30/2014   HR routinely in mid 40s-50s   Coronary artery disease (CAD) excluded April 2016   False-positive nuclear stress test suggesting inferior ischemia   Dysrhythmia    Elevated PSA    Encounter for Medicare annual wellness exam 09/22/2013   Sees Dr Wilhemina Bonito of Derm Sees Dr Silvano Rusk of Gastroenterology Sees Dr Kathie Rhodes of [facility] Sees Dr Susa Day of Optometry   GERD (gastroesophageal reflux disease)    controlled w/ diet and behavioral changes, history of   Grade I diastolic dysfunction 4332   History of kidney stones    Hypertriglyceridemia 03/16/2015   Lymphadenitis    Melanoma of back ([place])    Excised Dr Wilhemina Bonito   Mild ascending aorta dilatation ([place])    New onset a-fib ([place]) 03/10/2015   OA (osteoarthritis)    Otitis, externa, infective 08/10/2015   Palpitations    Post herpetic neuralgia    Shingles 07/09/2013   Sleep apnea 07/30/2011   cpap- 13    Snoring disorder 07/30/2011    Past Surgical History:  Procedure Laterality Date   CATARACT EXTRACTION Right    COLONOSCOPY  2018   CYSTOSCOPY/URETEROSCOPY/HOLMIUM LASER/STENT PLACEMENT Left 10/05/2019    Procedure: CYSTOSCOPY/RETROGRADE/URETEROSCOPY/HOLMIUM LASER/STENT PLACEMENT;  Surgeon: Kathie Rhodes, MD;  Location: [place];  Service: Urology;  Laterality: Left;   EYE SURGERY Left 12/06/2017   cataract by Dr Logan Bores HERNIA REPAIR Bilateral 06/01/2019   Procedure: LAPAROSCOPIC LEFT AND  RIGHT INGUINAL HERNIA REPAIR WITH MESH;  Surgeon: Michaelle Bottomley Boston, MD;  Location: [place];  Service: General;  Laterality: Bilateral;   LEFT HEART CATHETERIZATION WITH CORONARY ANGIOGRAM N/A 04/10/2015   Procedure: LEFT HEART CATHETERIZATION WITH  CORONARY ANGIOGRAM;  Surgeon: Leonie Man, MD;  Location: [facility] CATH LAB;  Service: Cardiovascular;  Angiographicallly NORMAL CORONARY ARTERIES   MASS EXCISION N/A 05/16/2014   Procedure: EXCISION POSTERIOR NECK MASS, RIGHT CHEST WALL MASS AND RIGHT AXILLARY MASS AXILLARY NODE DISSECTION;  Surgeon: Adin Hector, MD;  [facility];  Service: General;  Laterality: N/A;   NM MYOVIEW LTD  03/19/2015   FALSE POSITIVE:  INTERMEDIATE RISK. Small sized, moderate intensity inferior ischemic perfusion defect   REVERSE SHOULDER ARTHROPLASTY Left 10/16/2020   Procedure: REVERSE SHOULDER ARTHROPLASTY;  Surgeon: Justice Britain, MD;  [facility];  Service: Orthopedics;  Laterality: Left;  140min   TRANSTHORACIC ECHOCARDIOGRAM  03/20/2015   Normal LV size and function. EF 60-65%. G1 DD. Trivial AI. Borderline aortic root dilation (41 mm), Mild LA dilation.    There were no vitals filed for  this visit.    Subjective Assessment - 11/14/20 0921    Subjective Patient reports that he had left shoulder pain for a number of years, he underwent a left reverse total shoulder arthroplasty on 10/16/20.  He was in the sling for about 2 weeks.  He reports that he feels things are going well just can't move the arm much    Limitations Lifting;House hold activities    Patient Stated Goals have better ROM, strength and function with no  pain    Currently in Pain? Yes    Pain Score 0-No pain    Pain Location Shoulder    Pain Orientation Left    Pain Descriptors / Indicators Aching;Sore;Tightness    Pain Type Acute pain    Pain Radiating Towards denies    Pain Onset 1 to 4 weeks ago    Pain Frequency Intermittent    Aggravating Factors  reaching out and up pain up to 5/10    Pain Relieving Factors no moving "I have no pain"    Effect of Pain on Daily Activities haven't done a lot, I know I don't have good motion              [facility] PT Assessment - 11/14/20 0001      Assessment   Medical Diagnosis s/p left TSA reverse    Referring Provider (PT) Supple    Onset Date/Surgical Date 10/16/20    Hand Dominance Right      Precautions   Precaution Comments left reverse total shoulder      Balance Screen   Has the patient fallen in the past 6 months No    Has the patient had a decrease in activity level because of a fear of falling?  No    Is the patient reluctant to leave their home because of a fear of falling?  No      Home Environment   Additional Comments does yardwork, housework, does house repairs      Prior Function   Level of Independence Independent    Vocation Retired    Leisure no exercise      Mining engineer Comments rounded shoulder       ROM / Strength   AROM / PROM / Strength AROM;PROM      AROM   AROM Assessment Site Shoulder    Right/Left Shoulder Left    Left Shoulder Flexion 60 Degrees      PROM   PROM Assessment Site Shoulder    Right/Left Shoulder Left    Left Shoulder Flexion 90 Degrees    Left Shoulder ABduction 60 Degrees    Left Shoulder External Rotation 30 Degrees      Palpation   Palpation comment mild tightness anterior left shoulder over the scar                      Objective measurements completed on examination: See above findings.                 PT Short Term Goals - 11/14/20 0956      PT SHORT TERM GOAL #1    Title indepeneent with initial HEP    Time 2    Period Weeks    Status New             PT Long Term Goals - 11/14/20 0957      PT LONG TERM GOAL #1   Title understand protocol and limitations  Time 8    Period Weeks    Status New      PT LONG TERM GOAL #2   Title report no difficulty doing hair or dressing    Time 8    Period Weeks    Status New      PT LONG TERM GOAL #3   Title no pain with ADL's    Time 8    Period Weeks    Status New      PT LONG TERM GOAL #4   Title increase left shoulder AROM flexion to 120 degrees    Time 8    Period Weeks    Status New                  Plan - 11/14/20 0998    Clinical Impression Statement Patient reports that he underwent a left reverse TSA on 10/16/20, he was in a sling for 2 weeks and reports no pain unless he is moving the arm.  He is at the limits of the protocol today for PROM with some pain, mild tightness in the left anterior chest at the scar    Stability/Clinical Decision Making Evolving/Moderate complexity    Clinical Decision Making Low    Rehab Potential Good    PT Frequency 2x / week    PT Duration 8 weeks    PT Treatment/Interventions ADLs/Self Care Home Management;Cryotherapy;Electrical Stimulation;Therapeutic activities;Therapeutic exercise;Patient/family education;Manual techniques;Vasopneumatic Device    PT Next Visit Plan progress to week 4 of the protocol    Consulted and Agree with Plan of Care Patient           Patient will benefit from skilled therapeutic intervention in order to improve the following deficits and impairments:  Decreased range of motion, Increased muscle spasms, Impaired UE functional use, Pain, Improper body mechanics, Postural dysfunction, Decreased strength, Increased edema  Visit Diagnosis: Acute pain of left shoulder - Plan: PT plan of care cert/re-cert  Stiffness of left shoulder, not elsewhere classified - Plan: PT plan of care cert/re-cert  Localized edema -  Plan: PT plan of care cert/re-cert     Problem List Patient Active Problem List   Diagnosis Date Noted   Preop cardiovascular exam 10/10/2020   Bilateral hearing loss 09/11/2020   Muscle cramps 10/14/2019   Hearing loss of right ear 10/14/2019   Tinnitus of right ear 10/14/2019   Bilateral inguinal herniae (BIH) s/p lap repair w mesh 06/01/2019 06/01/2019   Right Femoral hernia s/p lap repair w mesh 06/01/2019 06/01/2019   Groin pain, left 12/11/2018   Obesity 08/18/2017   Other fatigue 08/18/2017   Vitamin D deficiency 08/18/2017   Hyperglycemia 08/18/2017   Lymphedema 09/21/2015   Preventative health care 09/21/2015   Abnormal exercise myocardial perfusion study 04/02/2015   Persistent atrial fibrillation (HCC) - CHA2DS2-VASc Score (Age-59, Aortic Plaque - 1) 03/16/2015   Hypertriglyceridemia 03/16/2015   Bradycardia 12/30/2014   Melanoma of back (HCC)    Mass of chest wall, right subfacial, 12cm s/p removal w rib resection 05/16/2014 03/08/2014   Mass of posterior neck, SQ 4cm 03/08/2014   Chest wall mass - right SQ anterior 5cm 08/21/2013   Post herpetic neuralgia 07/09/2013   OSA on CPAP 07/30/2011   ELEVATED PROSTATE SPECIFIC ANTIGEN 01/13/2010   LIPOMA, SKIN 08/29/2008   GERD 08/29/2008   BENIGN PROSTATIC HYPERTROPHY 08/29/2008    Sumner Boast., PT 11/14/2020, 10:00 AM  [place]. [place], [place],  [address] Phone: [phone]   Fax:  [phone]  Name: Markeis Allman MRN: [id number] Date of Birth: February 14, 1941

## 2020-11-14 NOTE — Patient Instructions (Signed)
Access Code: [email or web address] URL: [email or web address] Date: 11/14/2020 Prepared by: Lum Babe  Exercises Seated Shoulder External Rotation PROM on Table - 3 x daily - 7 x weekly - 1 sets - 10 reps - 10 hold Seated Shoulder Flexion Slide at Table Top with Forearm in Neutral - 3 x daily - 7 x weekly - 1 sets - 10 reps - 10 hold Seated Shoulder Shrugs - 3 x daily - 7 x weekly - 1 sets - 10 reps - 3 hold Seated Scapular Retraction - 3 x daily - 7 x weekly - 1 sets - 10 reps - 3 hold

## 2020-11-17 ENCOUNTER — Ambulatory Visit: Payer: Medicare Other | Admitting: Physical Therapy

## 2020-11-17 ENCOUNTER — Encounter: Payer: Self-pay | Admitting: Physical Therapy

## 2020-11-17 ENCOUNTER — Other Ambulatory Visit: Payer: Self-pay

## 2020-11-17 DIAGNOSIS — R600 Localized edema: Secondary | ICD-10-CM

## 2020-11-17 DIAGNOSIS — M25512 Pain in left shoulder: Secondary | ICD-10-CM | POA: Diagnosis not present

## 2020-11-17 DIAGNOSIS — M25612 Stiffness of left shoulder, not elsewhere classified: Secondary | ICD-10-CM

## 2020-11-17 NOTE — Therapy (Signed)
[place]. [place], [place], [address] Phone: [phone]   Fax:  [phone]  Physical Therapy Treatment  Patient Details  Name: [name] [name] MRN: [id number] Date of Birth: 1941-02-20 Referring Provider (PT): Supple   Encounter Date: 11/17/2020   PT End of Session - 11/17/20 1536    Visit Number 2    Date for PT Re-Evaluation 01/14/21    PT Start Time 1507    PT Stop Time 1547    PT Time Calculation (min) 40 min    Activity Tolerance Patient tolerated treatment well    Behavior During Therapy [facility] for tasks assessed/performed           Past Medical History:  Diagnosis Date  . Atrial fibrillation ([place])   . BPH (benign prostatic hypertrophy)   . Bradycardia 12/30/2014   HR routinely in mid 40s-50s  . Coronary artery disease (CAD) excluded April 2016   False-positive nuclear stress test suggesting inferior ischemia  . Dysrhythmia   . Elevated PSA   . Encounter for Medicare annual wellness exam 09/22/2013   Sees Dr Wilhemina Bonito of Derm Sees Dr Silvano Rusk of Gastroenterology Sees Dr Kathie Rhodes of [facility] Sees Dr Susa Day of Optometry  . GERD (gastroesophageal reflux disease)    controlled w/ diet and behavioral changes, history of  . Grade I diastolic dysfunction 6144  . History of kidney stones   . Hypertriglyceridemia 03/16/2015  . Lymphadenitis   . Melanoma of back ([place])    Excised Dr Wilhemina Bonito  . Mild ascending aorta dilatation ([facility])   . New onset a-fib ([place]) 03/10/2015  . OA (osteoarthritis)   . Otitis, externa, infective 08/10/2015  . Palpitations   . Post herpetic neuralgia   . Shingles 07/09/2013  . Sleep apnea 07/30/2011   cpap- 13   . Snoring disorder 07/30/2011    Past Surgical History:  Procedure Laterality Date  . CATARACT EXTRACTION Right   . COLONOSCOPY  2018  . CYSTOSCOPY/URETEROSCOPY/HOLMIUM LASER/STENT PLACEMENT Left 10/05/2019   Procedure:  CYSTOSCOPY/RETROGRADE/URETEROSCOPY/HOLMIUM LASER/STENT PLACEMENT;  Surgeon: Kathie Rhodes, MD;  Location: [facility];  Service: Urology;  Laterality: Left;  . EYE SURGERY Left 12/06/2017   cataract by Dr Gillian Scarce  . INGUINAL HERNIA REPAIR Bilateral 06/01/2019   Procedure: LAPAROSCOPIC LEFT AND  RIGHT INGUINAL HERNIA REPAIR WITH MESH;  Surgeon: Michael Boston, MD;  Location: [place];  Service: General;  Laterality: Bilateral;  . LEFT HEART CATHETERIZATION WITH CORONARY ANGIOGRAM N/A 04/10/2015   Procedure: LEFT HEART CATHETERIZATION WITH  CORONARY ANGIOGRAM;  Surgeon: Leonie Man, MD;  Location: [facility] CATH LAB;  Service: Cardiovascular;  Angiographicallly NORMAL CORONARY ARTERIES  . MASS EXCISION N/A 05/16/2014   Procedure: EXCISION POSTERIOR NECK MASS, RIGHT CHEST WALL MASS AND RIGHT AXILLARY MASS AXILLARY NODE DISSECTION;  Surgeon: Adin Hector, MD;  [facility];  Service: General;  Laterality: N/A;  . NM MYOVIEW LTD  03/19/2015   FALSE POSITIVE:  INTERMEDIATE RISK. Small sized, moderate intensity inferior ischemic perfusion defect  . REVERSE SHOULDER ARTHROPLASTY Left 10/16/2020   Procedure: REVERSE SHOULDER ARTHROPLASTY;  Surgeon: Justice Britain, MD;  [facility];  Service: Orthopedics;  Laterality: Left;  148min  . TRANSTHORACIC ECHOCARDIOGRAM  03/20/2015   Normal LV size and function. EF 60-65%. G1 DD. Trivial AI. Borderline aortic root dilation (41 mm), Mild LA dilation.    There were no vitals filed for this visit.   Subjective Assessment -  11/17/20 1510    Subjective Feeling good no pain unless I move it myself.    Currently in Pain? No/denies                             [facility] Adult PT Treatment/Exercise - 11/17/20 0001      Exercises   Exercises Shoulder      Shoulder Exercises: Seated   Other Seated Exercises Shoulder rolls frwd/back x10 ea      Shoulder Exercises: Standing   Other Standing Exercises Physioball  rolls (frwrd-bkwrd/CW/CCW/SS 2x10   R over L AAROM     Shoulder Exercises: Pulleys   Flexion 1 minute    Flexion Limitations 10x5"    Scaption 1 minute    Scaption Limitations 10x5"    ABduction 1 minute    ABduction Limitations 10x5"      Manual Therapy   Manual Therapy Passive ROM    Manual therapy comments All direction to tolerance                     PT Short Term Goals - 11/14/20 0956      PT SHORT TERM GOAL #1   Title indepeneent with initial HEP    Time 2    Period Weeks    Status New             PT Long Term Goals - 11/14/20 0957      PT LONG TERM GOAL #1   Title understand protocol and limitations    Time 8    Period Weeks    Status New      PT LONG TERM GOAL #2   Title report no difficulty doing hair or dressing    Time 8    Period Weeks    Status New      PT LONG TERM GOAL #3   Title no pain with ADL's    Time 8    Period Weeks    Status New      PT LONG TERM GOAL #4   Title increase left shoulder AROM flexion to 120 degrees    Time 8    Period Weeks    Status New                 Plan - 11/17/20 1544    Clinical Impression Statement Patient tolerated all PROM well today. He is very tight in ER. Progressed to some AAROM exercises with the pulley's and physioball.    PT Treatment/Interventions ADLs/Self Care Home Management;Cryotherapy;Electrical Stimulation;Therapeutic activities;Therapeutic exercise;Patient/family education;Manual techniques;Vasopneumatic Device    PT Next Visit Plan Continue to progress per protocol           Patient will benefit from skilled therapeutic intervention in order to improve the following deficits and impairments:  Decreased range of motion, Increased muscle spasms, Impaired UE functional use, Pain, Improper body mechanics, Postural dysfunction, Decreased strength, Increased edema  Visit Diagnosis: Acute pain of left shoulder  Stiffness of left shoulder, not elsewhere  classified  Localized edema     Problem List Patient Active Problem List   Diagnosis Date Noted  . Preop cardiovascular exam 10/10/2020  . Bilateral hearing loss 09/11/2020  . Muscle cramps 10/14/2019  . Hearing loss of right ear 10/14/2019  . Tinnitus of right ear 10/14/2019  . Bilateral inguinal herniae (BIH) s/p lap repair w mesh 06/01/2019 06/01/2019  . Right Femoral hernia s/p lap repair w mesh  06/01/2019 06/01/2019  . Groin pain, left 12/11/2018  . Obesity 08/18/2017  . Other fatigue 08/18/2017  . Vitamin D deficiency 08/18/2017  . Hyperglycemia 08/18/2017  . Lymphedema 09/21/2015  . Preventative health care 09/21/2015  . Abnormal exercise myocardial perfusion study 04/02/2015  . Persistent atrial fibrillation (HCC) - CHA2DS2-VASc Score (Age-75, Aortic Plaque - 1) 03/16/2015  . Hypertriglyceridemia 03/16/2015  . Bradycardia 12/30/2014  . Melanoma of back ([place])   . Mass of chest wall, right subfacial, 12cm s/p removal w rib resection 05/16/2014 03/08/2014  . Mass of posterior neck, SQ 4cm 03/08/2014  . Chest wall mass - right SQ anterior 5cm 08/21/2013  . Post herpetic neuralgia 07/09/2013  . OSA on CPAP 07/30/2011  . ELEVATED PROSTATE SPECIFIC ANTIGEN 01/13/2010  . LIPOMA, SKIN 08/29/2008  . GERD 08/29/2008  . BENIGN PROSTATIC HYPERTROPHY 08/29/2008    Lavenia Atlas, SPTA 11/17/2020, 3:47 PM  [place]. [place], [place], [address] Phone: [phone]   Fax:  [phone]  Name: [name] [name] MRN: [id number] Date of Birth: [birth year]

## 2020-11-19 ENCOUNTER — Other Ambulatory Visit: Payer: Self-pay

## 2020-11-19 ENCOUNTER — Encounter: Payer: Self-pay | Admitting: Physical Therapy

## 2020-11-19 ENCOUNTER — Ambulatory Visit: Payer: Medicare Other | Admitting: Physical Therapy

## 2020-11-19 DIAGNOSIS — M25512 Pain in left shoulder: Secondary | ICD-10-CM

## 2020-11-19 DIAGNOSIS — M25612 Stiffness of left shoulder, not elsewhere classified: Secondary | ICD-10-CM | POA: Diagnosis not present

## 2020-11-19 DIAGNOSIS — R600 Localized edema: Secondary | ICD-10-CM

## 2020-11-19 NOTE — Therapy (Signed)
[place]. [place], [place], [address] Phone: [phone]   Fax:  [phone]  Physical Therapy Treatment  Patient Details  Name: [name] [name] MRN: [id number] Date of Birth: [birth year] Referring Provider (PT): Supple   Encounter Date: 11/19/2020   PT End of Session - 11/19/20 1008    Visit Number 3    Date for PT Re-Evaluation 01/14/21    Authorization Type [employer]    PT Start Time 0925    PT Stop Time 1009    PT Time Calculation (min) 44 min    Activity Tolerance Patient tolerated treatment well    Behavior During Therapy [facility] for tasks assessed/performed           Past Medical History:  Diagnosis Date   Atrial fibrillation ([place])    BPH (benign prostatic hypertrophy)    Bradycardia 12/30/2014   HR routinely in mid 40s-50s   Coronary artery disease (CAD) excluded April 2016   False-positive nuclear stress test suggesting inferior ischemia   Dysrhythmia    Elevated PSA    Encounter for Medicare annual wellness exam 09/22/2013   Sees Dr Wilhemina Bonito of Derm Sees Dr Silvano Rusk of Gastroenterology Sees Dr Kathie Rhodes of [facility] Sees Dr Susa Day of Optometry   GERD (gastroesophageal reflux disease)    controlled w/ diet and behavioral changes, history of   Grade I diastolic dysfunction 0034   History of kidney stones    Hypertriglyceridemia 03/16/2015   Lymphadenitis    Melanoma of back ([place])    Excised Dr Wilhemina Bonito   Mild ascending aorta dilatation ([place])    New onset a-fib ([place]) 03/10/2015   OA (osteoarthritis)    Otitis, externa, infective 08/10/2015   Palpitations    Post herpetic neuralgia    Shingles 07/09/2013   Sleep apnea 07/30/2011   cpap- 13    Snoring disorder 07/30/2011    Past Surgical History:  Procedure Laterality Date   CATARACT EXTRACTION Right    COLONOSCOPY  2018   CYSTOSCOPY/URETEROSCOPY/HOLMIUM LASER/STENT PLACEMENT Left 10/05/2019    Procedure: CYSTOSCOPY/RETROGRADE/URETEROSCOPY/HOLMIUM LASER/STENT PLACEMENT;  Surgeon: Kathie Rhodes, MD;  Location: [place];  Service: Urology;  Laterality: Left;   EYE SURGERY Left 12/06/2017   cataract by Dr Logan Bores HERNIA REPAIR Bilateral 06/01/2019   Procedure: LAPAROSCOPIC LEFT AND  RIGHT INGUINAL HERNIA REPAIR WITH MESH;  Surgeon: Jasper Ruminski Boston, MD;  Location: [place];  Service: General;  Laterality: Bilateral;   LEFT HEART CATHETERIZATION WITH CORONARY ANGIOGRAM N/A 04/10/2015   Procedure: LEFT HEART CATHETERIZATION WITH  CORONARY ANGIOGRAM;  Surgeon: Leonie Man, MD;  Location[facility] C[facility];  Service: Cardiovascular;  Angiographicallly NORMAL CORONARY ARTERIES   MASS EXCISION N/A 05/16/2014   Procedure: EXCISION POSTERIOR NECK MASS, RIGHT CHEST WALL MASS AND RIGHT AXILLARY MASS AXILLARY NODE DISSECTION;  Surgeon: Adin Hector, MD;  [facility];  Service: General;  Laterality: N/A;   NM MYOVIEW LTD  03/19/2015   FALSE POSITIVE:  INTERMEDIATE RISK. Small sized, moderate intensity inferior ischemic perfusion defect   REVERSE SHOULDER ARTHROPLASTY Left 10/16/2020   Procedure: REVERSE SHOULDER ARTHROPLASTY;  Surgeon: Justice Britain, MD;  [facility];  Service: Orthopedics;  Laterality: Left;  152mn   TRANSTHORACIC ECHOCARDIOGRAM  03/20/2015   Normal LV size and function. EF 60-65%. G1 DD. Trivial AI. Borderline aortic root dilation (41 mm), Mild LA dilation.    There were no vitals filed for  this visit.   Subjective Assessment - 11/19/20 0931    Subjective Feeling pretty good, no soreness after the last time    Currently in Pain? No/denies              [facility] PT Assessment - 11/19/20 0001      PROM   Left Shoulder Flexion 95 Degrees    Left Shoulder ABduction 70 Degrees    Left Shoulder External Rotation 35 Degrees                         [facility] Adult PT Treatment/Exercise - 11/19/20 0001       Shoulder Exercises: Seated   Other Seated Exercises Shoulder rolls frwd/back x10 ea      Shoulder Exercises: Standing   Other Standing Exercises Physioball rolls (frwrd-bkwrd/CW/CCW/SS 2x10    Other Standing Exercises yellow tband triceps 2x15, yellow tband biceps, hitchhikers      Shoulder Exercises: ROM/Strengthening   UBE (Upper Arm Bike) x 5 minutes for the motion      Shoulder Exercises: Isometric Strengthening   Extension 5X5"    Extension Limitations pillow against the wall    ADduction 5X5"    ADduction Limitations with pillow at side                    PT Short Term Goals - 11/14/20 0956      PT SHORT TERM GOAL #1   Title indepeneent with initial HEP    Time 2    Period Weeks    Status New             PT Long Term Goals - 11/19/20 1010      PT LONG TERM GOAL #1   Title understand protocol and limitations    Status Partially Met                 Plan - 11/19/20 1009    Clinical Impression Statement Patient doing well, we are at week 4+, added a few exercises and no issues, he did gain PROM to within the protocol, he does required verbal and tactile cues to relax.    PT Next Visit Plan week 4-8 of the protocol    Consulted and Agree with Plan of Care Patient           Patient will benefit from skilled therapeutic intervention in order to improve the following deficits and impairments:  Decreased range of motion, Increased muscle spasms, Impaired UE functional use, Pain, Improper body mechanics, Postural dysfunction, Decreased strength, Increased edema  Visit Diagnosis: Acute pain of left shoulder  Stiffness of left shoulder, not elsewhere classified  Localized edema     Problem List Patient Active Problem List   Diagnosis Date Noted   Preop cardiovascular exam 10/10/2020   Bilateral hearing loss 09/11/2020   Muscle cramps 10/14/2019   Hearing loss of right ear 10/14/2019   Tinnitus of right ear 10/14/2019   Bilateral  inguinal herniae (BIH) s/p lap repair w mesh 06/01/2019 06/01/2019   Right Femoral hernia s/p lap repair w mesh 06/01/2019 06/01/2019   Groin pain, left 12/11/2018   Obesity 08/18/2017   Other fatigue 08/18/2017   Vitamin D deficiency 08/18/2017   Hyperglycemia 08/18/2017   Lymphedema 09/21/2015   Preventative health care 09/21/2015   Abnormal exercise myocardial perfusion study 04/02/2015   Persistent atrial fibrillation (HCC) - CHA2DS2-VASc Score (Age-75, Aortic Plaque - 1) 03/16/2015   Hypertriglyceridemia 03/16/2015  Bradycardia 12/30/2014   Melanoma of back [facility])    Mass of chest wall, right subfacial, 12cm s/p removal w rib resection 05/16/2014 03/08/2014   Mass of posterior neck, SQ 4cm 03/08/2014   Chest wall mass - right SQ anterior 5cm 08/21/2013   Post herpetic neuralgia 07/09/2013   OSA on CPAP 07/30/2011   ELEVATED PROSTATE SPECIFIC ANTIGEN 01/13/2010   LIPOMA, SKIN 08/29/2008   GERD 08/29/2008   BENIGN PROSTATIC HYPERTROPHY 08/29/2008    Sumner Boast., PT 11/19/2020, 10:11 AM  [place]. [place], [place], [address] Phone: [phone]   Fax:  [phone]  Name: Faraaz Wolin MRN: [id number] Date of Birth: 04/17/1941

## 2020-11-24 ENCOUNTER — Ambulatory Visit: Payer: Medicare Other | Admitting: Physical Therapy

## 2020-11-24 ENCOUNTER — Encounter: Payer: Self-pay | Admitting: Physical Therapy

## 2020-11-24 ENCOUNTER — Other Ambulatory Visit: Payer: Self-pay

## 2020-11-24 DIAGNOSIS — R600 Localized edema: Secondary | ICD-10-CM | POA: Diagnosis not present

## 2020-11-24 DIAGNOSIS — M25512 Pain in left shoulder: Secondary | ICD-10-CM | POA: Diagnosis not present

## 2020-11-24 DIAGNOSIS — M25612 Stiffness of left shoulder, not elsewhere classified: Secondary | ICD-10-CM | POA: Diagnosis not present

## 2020-11-24 NOTE — Therapy (Signed)
[place] [place], [place], [address] Phone: [phone]   Fax:  [phone]  Physical Therapy Treatment  Patient Details  Name: [name] [name] MRN: [id number] Date of Birth: [birth year] Referring Provider (PT): Supple   Encounter Date: 11/24/2020   PT End of Session - 11/24/20 0942    Visit Number 4    Date for PT Re-Evaluation 01/14/21    PT Start Time 0931    PT Stop Time 1015    PT Time Calculation (min) 44 min    Activity Tolerance Patient tolerated treatment well    Behavior During Therapy [facility] for tasks assessed/performed           Past Medical History:  Diagnosis Date  . Atrial fibrillation ([place])   . BPH (benign prostatic hypertrophy)   . Bradycardia 12/30/2014   HR routinely in mid 40s-50s  . Coronary artery disease (CAD) excluded April 2016   False-positive nuclear stress test suggesting inferior ischemia  . Dysrhythmia   . Elevated PSA   . Encounter for Medicare annual wellness exam 09/22/2013   Sees Dr Wilhemina Bonito of Derm Sees Dr Silvano Rusk of Gastroenterology Sees Dr Kathie Rhodes of [facility] Sees Dr Susa Day of Optometry  . GERD (gastroesophageal reflux disease)    controlled w/ diet and behavioral changes, history of  . Grade I diastolic dysfunction 5277  . History of kidney stones   . Hypertriglyceridemia 03/16/2015  . Lymphadenitis   . Melanoma of back ([place])    Excised Dr Wilhemina Bonito  . Mild ascending aorta dilatation (HCC)   . New onset a-fib ([place]) 03/10/2015  . OA (osteoarthritis)   . Otitis, externa, infective 08/10/2015  . Palpitations   . Post herpetic neuralgia   . Shingles 07/09/2013  . Sleep apnea 07/30/2011   cpap- 13   . Snoring disorder 07/30/2011    Past Surgical History:  Procedure Laterality Date  . CATARACT EXTRACTION Right   . COLONOSCOPY  2018  . CYSTOSCOPY/URETEROSCOPY/HOLMIUM LASER/STENT PLACEMENT Left 10/05/2019   Procedure:  CYSTOSCOPY/RETROGRADE/URETEROSCOPY/HOLMIUM LASER/STENT PLACEMENT;  Surgeon: Kathie Rhodes, MD;  Location: [facility];  Service: Urology;  Laterality: Left;  . EYE SURGERY Left 12/06/2017   cataract by Dr Gillian Scarce  . INGUINAL HERNIA REPAIR Bilateral 06/01/2019   Procedure: LAPAROSCOPIC LEFT AND  RIGHT INGUINAL HERNIA REPAIR WITH MESH;  Surgeon: Michael Boston, MD;  Location: [place];  Service: General;  Laterality: Bilateral;  . LEFT HEART CATHETERIZATION WITH CORONARY ANGIOGRAM N/A 04/10/2015   Procedure: LEFT HEART CATHETERIZATION WITH  CORONARY ANGIOGRAM;  Surgeon: Leonie Man, MD;  Location: [facility] CATH LAB;  Service: Cardiovascular;  Angiographicallly NORMAL CORONARY ARTERIES  . MASS EXCISION N/A 05/16/2014   Procedure: EXCISION POSTERIOR NECK MASS, RIGHT CHEST WALL MASS AND RIGHT AXILLARY MASS AXILLARY NODE DISSECTION;  Surgeon: Adin Hector, MD;  Location: [facility];  Service: General;  Laterality: N/A;  . NM MYOVIEW LTD  03/19/2015   FALSE POSITIVE:  INTERMEDIATE RISK. Small sized, moderate intensity inferior ischemic perfusion defect  . REVERSE SHOULDER ARTHROPLASTY Left 10/16/2020   Procedure: REVERSE SHOULDER ARTHROPLASTY;  Surgeon: Justice Britain, MD;  Location: [facility];  Service: Orthopedics;  Laterality: Left;  189mn  . TRANSTHORACIC ECHOCARDIOGRAM  03/20/2015   Normal LV size and function. EF 60-65%. G1 DD. Trivial AI. Borderline aortic root dilation (41 mm), Mild LA dilation.    There were no vitals filed for this visit.   Subjective Assessment -  11/24/20 0932    Subjective "Im very pleased and happy with my shoulder, I am able to do the things I normally do."    Currently in Pain? No/denies                             [facility] Adult PT Treatment/Exercise - 11/24/20 0001      Shoulder Exercises: Supine   Other Supine Exercises Cane chest press 2x10      Shoulder Exercises: Standing   Other Standing Exercises Physioball rolls  (frwrd-bkwrd/CW/CCW/SS 2x10    Other Standing Exercises yellow tband triceps 2x15, L biceps curls #2, hitchhikers 15x3"      Shoulder Exercises: Pulleys   Flexion 1 minute    Flexion Limitations 6x10"    Scaption 1 minute    Scaption Limitations 6x10"    ABduction 1 minute    ABduction Limitations 6x10"      Shoulder Exercises: ROM/Strengthening   Nustep L1 21mns    L arm PROM      Manual Therapy   Manual Therapy Passive ROM    Manual therapy comments All direction to tolerance within protocol    Passive ROM gaurded and needed curing to relax                     PT Short Term Goals - 11/14/20 0956      PT SHORT TERM GOAL #1   Title indepeneent with initial HEP    Time 2    Period Weeks    Status New             PT Long Term Goals - 11/19/20 1010      PT LONG TERM GOAL #1   Title understand protocol and limitations    Status Partially Met                 Plan - 11/24/20 0942    Clinical Impression Statement Continuing to progress patient per protocol. He is doing well with intro to new exercises with minimal pain and just a little bit of sorenss in the anterior capsule of his shoulder. Cuing throughout for tempo (not performing exercises too fast) and correct technique. He is failry gaurded during PROM and needed verbal and tactile cuing to relax.    PT Treatment/Interventions ADLs/Self Care Home Management;Cryotherapy;Electrical Stimulation;Therapeutic activities;Therapeutic exercise;Patient/family education;Manual techniques;Vasopneumatic Device    PT Next Visit Plan week 4-8 of the protocol           Patient will benefit from skilled therapeutic intervention in order to improve the following deficits and impairments:  Decreased range of motion, Increased muscle spasms, Impaired UE functional use, Pain, Improper body mechanics, Postural dysfunction, Decreased strength, Increased edema  Visit Diagnosis: Stiffness of left shoulder, not elsewhere  classified  Localized edema  Acute pain of left shoulder     Problem List Patient Active Problem List   Diagnosis Date Noted  . Preop cardiovascular exam 10/10/2020  . Bilateral hearing loss 09/11/2020  . Muscle cramps 10/14/2019  . Hearing loss of right ear 10/14/2019  . Tinnitus of right ear 10/14/2019  . Bilateral inguinal herniae (BIH) s/p lap repair w mesh 06/01/2019 06/01/2019  . Right Femoral hernia s/p lap repair w mesh 06/01/2019 06/01/2019  . Groin pain, left 12/11/2018  . Obesity 08/18/2017  . Other fatigue 08/18/2017  . Vitamin D deficiency 08/18/2017  . Hyperglycemia 08/18/2017  . Lymphedema 09/21/2015  . Preventative health  care 09/21/2015  . Abnormal exercise myocardial perfusion study 04/02/2015  . Persistent atrial fibrillation (HCC) - CHA2DS2-VASc Score (Age-54, Aortic Plaque - 1) 03/16/2015  . Hypertriglyceridemia 03/16/2015  . Bradycardia 12/30/2014  . Melanoma of back ([place])   . Mass of chest wall, right subfacial, 12cm s/p removal w rib resection 05/16/2014 03/08/2014  . Mass of posterior neck, SQ 4cm 03/08/2014  . Chest wall mass - right SQ anterior 5cm 08/21/2013  . Post herpetic neuralgia 07/09/2013  . OSA on CPAP 07/30/2011  . ELEVATED PROSTATE SPECIFIC ANTIGEN 01/13/2010  . LIPOMA, SKIN 08/29/2008  . GERD 08/29/2008  . BENIGN PROSTATIC HYPERTROPHY 08/29/2008    Lavenia Atlas, SPTA 11/24/2020, 10:23 AM  [place] [place], [place], [address] Phone: [phone]   Fax:  [phone]  Name: [name] [name] MRN: [id number] Date of Birth: November 18, 1941

## 2020-11-25 DIAGNOSIS — Z96612 Presence of left artificial shoulder joint: Secondary | ICD-10-CM | POA: Insufficient documentation

## 2020-11-27 ENCOUNTER — Other Ambulatory Visit: Payer: Self-pay

## 2020-11-27 ENCOUNTER — Telehealth: Payer: Self-pay | Admitting: Family Medicine

## 2020-11-27 ENCOUNTER — Ambulatory Visit: Payer: Medicare Other | Attending: Orthopedic Surgery | Admitting: Physical Therapy

## 2020-11-27 ENCOUNTER — Encounter: Payer: Self-pay | Admitting: Physical Therapy

## 2020-11-27 DIAGNOSIS — M25612 Stiffness of left shoulder, not elsewhere classified: Secondary | ICD-10-CM | POA: Insufficient documentation

## 2020-11-27 DIAGNOSIS — R600 Localized edema: Secondary | ICD-10-CM | POA: Diagnosis not present

## 2020-11-27 DIAGNOSIS — M25512 Pain in left shoulder: Secondary | ICD-10-CM | POA: Diagnosis not present

## 2020-11-27 NOTE — Progress Notes (Signed)
  Chronic Care Management   Outreach Note  11/27/2020 Name: [name] [name] MRN: [id number] DOB: 1941-04-19  Referred by: Mosie Lukes, MD Reason for referral : No chief complaint on file.   An unsuccessful telephone outreach was attempted today. The patient was referred to the pharmacist for assistance with care management and care coordination.   Follow Up Plan:   Carley Perdue [employer] Scheduler

## 2020-11-27 NOTE — Therapy (Signed)
[place] [place], [place], [address] Phone: [phone]   Fax:  [phone]  Physical Therapy Treatment  Patient Details  Name: [name] [name] MRN: [id number] Date of Birth: 06/05/1941 Referring Provider (PT): Supple   Encounter Date: 11/27/2020   PT End of Session - 11/27/20 1013    Visit Number 5    Date for PT Re-Evaluation 01/14/21    Authorization Type [employer] [place]    PT Start Time 0930    PT Stop Time 1012    PT Time Calculation (min) 42 min    Activity Tolerance Patient tolerated treatment well    Behavior During Therapy [facility] for tasks assessed/performed           Past Medical History:  Diagnosis Date  . Atrial fibrillation ([place])   . BPH (benign prostatic hypertrophy)   . Bradycardia 12/30/2014   HR routinely in mid 40s-50s  . Coronary artery disease (CAD) excluded April 2016   False-positive nuclear stress test suggesting inferior ischemia  . Dysrhythmia   . Elevated PSA   . Encounter for Medicare annual wellness exam 09/22/2013   Sees Dr Wilhemina Bonito of Derm Sees Dr Silvano Rusk of Gastroenterology Sees Dr Kathie Rhodes of [facility] Sees Dr Susa Day of Optometry  . GERD (gastroesophageal reflux disease)    controlled w/ diet and behavioral changes, history of  . Grade I diastolic dysfunction 5027  . History of kidney stones   . Hypertriglyceridemia 03/16/2015  . Lymphadenitis   . Melanoma of back ([place])    Excised Dr Wilhemina Bonito  . Mild ascending aorta dilatation (HCC)   . New onset a-fib ([place]) 03/10/2015  . OA (osteoarthritis)   . Otitis, externa, infective 08/10/2015  . Palpitations   . Post herpetic neuralgia   . Shingles 07/09/2013  . Sleep apnea 07/30/2011   cpap- 13   . Snoring disorder 07/30/2011    Past Surgical History:  Procedure Laterality Date  . CATARACT EXTRACTION Right   . COLONOSCOPY  2018  . CYSTOSCOPY/URETEROSCOPY/HOLMIUM LASER/STENT PLACEMENT Left 10/05/2019    Procedure: CYSTOSCOPY/RETROGRADE/URETEROSCOPY/HOLMIUM LASER/STENT PLACEMENT;  Surgeon: Kathie Rhodes, MD;  Location: [facility];  Service: Urology;  Laterality: Left;  . EYE SURGERY Left 12/06/2017   cataract by Dr Gillian Scarce  . INGUINAL HERNIA REPAIR Bilateral 06/01/2019   Procedure: LAPAROSCOPIC LEFT AND  RIGHT INGUINAL HERNIA REPAIR WITH MESH;  Surgeon: Michael Boston, MD;  Location: [place];  Service: General;  Laterality: Bilateral;  . LEFT HEART CATHETERIZATION WITH CORONARY ANGIOGRAM N/A 04/10/2015   Procedure: LEFT HEART CATHETERIZATION WITH  CORONARY ANGIOGRAM;  Surgeon: Leonie Man, MD;  Location: [facility] CATH LAB;  Service: Cardiovascular;  Angiographicallly NORMAL CORONARY ARTERIES  . MASS EXCISION N/A 05/16/2014   Procedure: EXCISION POSTERIOR NECK MASS, RIGHT CHEST WALL MASS AND RIGHT AXILLARY MASS AXILLARY NODE DISSECTION;  Surgeon: Adin Hector, MD;  [facility];  Service: General;  Laterality: N/A;  . NM MYOVIEW LTD  03/19/2015   FALSE POSITIVE:  INTERMEDIATE RISK. Small sized, moderate intensity inferior ischemic perfusion defect  . REVERSE SHOULDER ARTHROPLASTY Left 10/16/2020   Procedure: REVERSE SHOULDER ARTHROPLASTY;  Surgeon: Justice Britain, MD;  Location: [facility];  Service: Orthopedics;  Laterality: Left;  150mn  . TRANSTHORACIC ECHOCARDIOGRAM  03/20/2015   Normal LV size and function. EF 60-65%. G1 DD. Trivial AI. Borderline aortic root dilation (41 mm), Mild LA dilation.    There were no vitals filed for  this visit.   Subjective Assessment - 11/27/20 0932    Subjective MD was pleased yesterday, Feeling good no pain    Currently in Pain? No/denies                             [facility] Adult PT Treatment/Exercise - 11/27/20 0001      Shoulder Exercises: Standing   Extension Theraband;20 reps;Both;Strengthening    Theraband Level (Shoulder Extension) Level 2 (Red)    Row 20 reps;Theraband;Both;Strengthening     Theraband Level (Shoulder Row) Level 2 (Red)    Other Standing Exercises Red tband triceps 2x15, L biceps curls #2, hitchhikers 15x3"      Shoulder Exercises: ROM/Strengthening   UBE (Upper Arm Bike) L1 x 3 min each     Other ROM/Strengthening Exercises AAROM Flex, Ext, IR up back x10 each       Manual Therapy   Manual Therapy Passive ROM    Manual therapy comments All direction to tolerance within protocol    Passive ROM gaurded and needed curing to relax                     PT Short Term Goals - 11/14/20 0956      PT SHORT TERM GOAL #1   Title indepeneent with initial HEP    Time 2    Period Weeks    Status New             PT Long Term Goals - 11/19/20 1010      PT LONG TERM GOAL #1   Title understand protocol and limitations    Status Partially Met                 Plan - 11/27/20 1014    Clinical Impression Statement Pt did well with a progressed treatment session. He stated his arm already moves better than it has in years. Postural cues needed for standing rows and extensions. No reports of increase pain during today's session. Cues for pacing at times needed not to do the exercises too fast.    Stability/Clinical Decision Making Evolving/Moderate complexity    Rehab Potential Good    PT Frequency 2x / week    PT Treatment/Interventions ADLs/Self Care Home Management;Cryotherapy;Electrical Stimulation;Therapeutic activities;Therapeutic exercise;Patient/family education;Manual techniques;Vasopneumatic Device    PT Next Visit Plan week 4-8 of the protocol           Patient will benefit from skilled therapeutic intervention in order to improve the following deficits and impairments:  Decreased range of motion, Increased muscle spasms, Impaired UE functional use, Pain, Improper body mechanics, Postural dysfunction, Decreased strength, Increased edema  Visit Diagnosis: Stiffness of left shoulder, not elsewhere classified  Acute pain of left  shoulder  Localized edema     Problem List Patient Active Problem List   Diagnosis Date Noted  . Preop cardiovascular exam 10/10/2020  . Bilateral hearing loss 09/11/2020  . Muscle cramps 10/14/2019  . Hearing loss of right ear 10/14/2019  . Tinnitus of right ear 10/14/2019  . Bilateral inguinal herniae (BIH) s/p lap repair w mesh 06/01/2019 06/01/2019  . Right Femoral hernia s/p lap repair w mesh 06/01/2019 06/01/2019  . Groin pain, left 12/11/2018  . Obesity 08/18/2017  . Other fatigue 08/18/2017  . Vitamin D deficiency 08/18/2017  . Hyperglycemia 08/18/2017  . Lymphedema 09/21/2015  . Preventative health care 09/21/2015  . Abnormal exercise myocardial perfusion study 04/02/2015  .  Persistent atrial fibrillation (HCC) - CHA2DS2-VASc Score (Age-44, Aortic Plaque - 1) 03/16/2015  . Hypertriglyceridemia 03/16/2015  . Bradycardia 12/30/2014  . Melanoma of back ([place])   . Mass of chest wall, right subfacial, 12cm s/p removal w rib resection 05/16/2014 03/08/2014  . Mass of posterior neck, SQ 4cm 03/08/2014  . Chest wall mass - right SQ anterior 5cm 08/21/2013  . Post herpetic neuralgia 07/09/2013  . OSA on CPAP 07/30/2011  . ELEVATED PROSTATE SPECIFIC ANTIGEN 01/13/2010  . LIPOMA, SKIN 08/29/2008  . GERD 08/29/2008  . BENIGN PROSTATIC HYPERTROPHY 08/29/2008    Scot Jun, PTA 11/27/2020, 10:16 AM  [place]. [place], [place], [address] Phone: [phone]   Fax:  [phone]  Name: [name] [name] MRN: [id number] Date of Birth: Oct 12, 1941

## 2020-11-28 DIAGNOSIS — H60331 Swimmer's ear, right ear: Secondary | ICD-10-CM | POA: Diagnosis not present

## 2020-12-01 ENCOUNTER — Other Ambulatory Visit: Payer: Self-pay

## 2020-12-01 ENCOUNTER — Encounter: Payer: Self-pay | Admitting: Physical Therapy

## 2020-12-01 ENCOUNTER — Ambulatory Visit: Payer: Medicare Other | Admitting: Physical Therapy

## 2020-12-01 DIAGNOSIS — M25512 Pain in left shoulder: Secondary | ICD-10-CM | POA: Diagnosis not present

## 2020-12-01 DIAGNOSIS — M25612 Stiffness of left shoulder, not elsewhere classified: Secondary | ICD-10-CM | POA: Diagnosis not present

## 2020-12-01 DIAGNOSIS — R600 Localized edema: Secondary | ICD-10-CM

## 2020-12-01 NOTE — Therapy (Signed)
[place]. [place], [place], [address] Phone: [phone]   Fax:  [phone]  Physical Therapy Treatment  Patient Details  Name: [name] [name] MRN: [id number] Date of Birth: 1941/06/15 Referring Provider (PT): Supple   Encounter Date: 12/01/2020   PT End of Session - 12/01/20 1359    Visit Number 6    Date for PT Re-Evaluation 01/14/21    PT Start Time 1340    PT Stop Time 1427    PT Time Calculation (min) 47 min    Activity Tolerance Patient tolerated treatment well    Behavior During Therapy [facility] for tasks assessed/performed           Past Medical History:  Diagnosis Date  . Atrial fibrillation ([place])   . BPH (benign prostatic hypertrophy)   . Bradycardia 12/30/2014   HR routinely in mid 40s-50s  . Coronary artery disease (CAD) excluded April 2016   False-positive nuclear stress test suggesting inferior ischemia  . Dysrhythmia   . Elevated PSA   . Encounter for Medicare annual wellness exam 09/22/2013   Sees Dr Wilhemina Bonito of Derm Sees Dr Silvano Rusk of Gastroenterology Sees Dr Kathie Rhodes of [facility] Sees Dr Susa Day of Optometry  . GERD (gastroesophageal reflux disease)    controlled w/ diet and behavioral changes, history of  . Grade I diastolic dysfunction 2094  . History of kidney stones   . Hypertriglyceridemia 03/16/2015  . Lymphadenitis   . Melanoma of back ([place])    Excised Dr Wilhemina Bonito  . Mild ascending aorta dilatation (HCC)   . New onset a-fib ([place]) 03/10/2015  . OA (osteoarthritis)   . Otitis, externa, infective 08/10/2015  . Palpitations   . Post herpetic neuralgia   . Shingles 07/09/2013  . Sleep apnea 07/30/2011   cpap- 13   . Snoring disorder 07/30/2011    Past Surgical History:  Procedure Laterality Date  . CATARACT EXTRACTION Right   . COLONOSCOPY  2018  . CYSTOSCOPY/URETEROSCOPY/HOLMIUM LASER/STENT PLACEMENT Left 10/05/2019   Procedure:  CYSTOSCOPY/RETROGRADE/URETEROSCOPY/HOLMIUM LASER/STENT PLACEMENT;  Surgeon: Kathie Rhodes, MD;  Location[facility];  Service: Urology;  Laterality: Left;  . EYE SURGERY Left 12/06/2017   cataract by Dr Gillian Scarce  . INGUINAL HERNIA REPAIR Bilateral 06/01/2019   Procedure: LAPAROSCOPIC LEFT AND  RIGHT INGUINAL HERNIA REPAIR WITH MESH;  Surgeon: Michael Boston, MD;  Location: [place];  Service: General;  Laterality: Bilateral;  . LEFT HEART CATHETERIZATION WITH CORONARY ANGIOGRAM N/A 04/10/2015   Procedure: LEFT HEART CATHETERIZATION WITH  CORONARY ANGIOGRAM;  Surgeon: Leonie Man, MD;  Location: [facility] CATH LAB;  Service: Cardiovascular;  Angiographicallly NORMAL CORONARY ARTERIES  . MASS EXCISION N/A 05/16/2014   Procedure: EXCISION POSTERIOR NECK MASS, RIGHT CHEST WALL MASS AND RIGHT AXILLARY MASS AXILLARY NODE DISSECTION;  Surgeon: Adin Hector, MD;  [facility];  Service: General;  Laterality: N/A;  . NM MYOVIEW LTD  03/19/2015   FALSE POSITIVE:  INTERMEDIATE RISK. Small sized, moderate intensity inferior ischemic perfusion defect  . REVERSE SHOULDER ARTHROPLASTY Left 10/16/2020   Procedure: REVERSE SHOULDER ARTHROPLASTY;  Surgeon: Justice Britain, MD;  [facility];  Service: Orthopedics;  Laterality: Left;  1106mn  . TRANSTHORACIC ECHOCARDIOGRAM  03/20/2015   Normal LV size and function. EF 60-65%. G1 DD. Trivial AI. Borderline aortic root dilation (41 mm), Mild LA dilation.    There were no vitals filed for this visit.   Subjective Assessment -  12/01/20 1340    Subjective He is very pleased with his shoulder. Michela Pitcher he is able to do everything he did before the surgery. Feels like his strength and ROM are improving.    Currently in Pain? No/denies                             [facility] Adult PT Treatment/Exercise - 12/01/20 0001      Shoulder Exercises: Standing   Extension Theraband;20 reps;Both;Strengthening    Theraband Level  (Shoulder Extension) Level 2 (Red)    Row 20 reps;Theraband;Both;Strengthening    Theraband Level (Shoulder Row) Level 2 (Red)    Shoulder Elevation Both   2x10   Shoulder Elevation Limitations #5 2x10    Other Standing Exercises Red tband triceps 2x15, L biceps curls #3, hitchhikers 15x3"      Shoulder Exercises: ROM/Strengthening   UBE (Upper Arm Bike) L1 3 mins ea way    Nustep L4 5 mins    Other ROM/Strengthening Exercises AAROM Flex, Ext, Scap, IR up back 2x10 each    #1 water bar     Manual Therapy   Manual Therapy Passive ROM    Manual therapy comments All direction to tolerance within protocol                    PT Short Term Goals - 11/14/20 0956      PT SHORT TERM GOAL #1   Title indepeneent with initial HEP    Time 2    Period Weeks    Status New             PT Long Term Goals - 11/19/20 1010      PT LONG TERM GOAL #1   Title understand protocol and limitations    Status Partially Met                 Plan - 12/01/20 1400    Clinical Impression Statement Patient continues to do well with ther ex progression. Cues needed to prevent shoulder elevation during theraband exercises, also needed cuing for tempo of exercises to not go too fast and control the full ROM of movement.    PT Treatment/Interventions ADLs/Self Care Home Management;Cryotherapy;Electrical Stimulation;Therapeutic activities;Therapeutic exercise;Patient/family education;Manual techniques;Vasopneumatic Device    PT Next Visit Plan week 4-8 of the protocol           Patient will benefit from skilled therapeutic intervention in order to improve the following deficits and impairments:  Decreased range of motion, Increased muscle spasms, Impaired UE functional use, Pain, Improper body mechanics, Postural dysfunction, Decreased strength, Increased edema  Visit Diagnosis: Stiffness of left shoulder, not elsewhere classified  Acute pain of left shoulder  Localized  edema     Problem List Patient Active Problem List   Diagnosis Date Noted  . Preop cardiovascular exam 10/10/2020  . Bilateral hearing loss 09/11/2020  . Muscle cramps 10/14/2019  . Hearing loss of right ear 10/14/2019  . Tinnitus of right ear 10/14/2019  . Bilateral inguinal herniae (BIH) s/p lap repair w mesh 06/01/2019 06/01/2019  . Right Femoral hernia s/p lap repair w mesh 06/01/2019 06/01/2019  . Groin pain, left 12/11/2018  . Obesity 08/18/2017  . Other fatigue 08/18/2017  . Vitamin D deficiency 08/18/2017  . Hyperglycemia 08/18/2017  . Lymphedema 09/21/2015  . Preventative health care 09/21/2015  . Abnormal exercise myocardial perfusion study 04/02/2015  . Persistent atrial fibrillation (HCC) - CHA2DS2-VASc Score (  Age-65, Aortic Plaque - 1) 03/16/2015  . Hypertriglyceridemia 03/16/2015  . Bradycardia 12/30/2014  . Melanoma of back ([place])   . Mass of chest wall, right subfacial, 12cm s/p removal w rib resection 05/16/2014 03/08/2014  . Mass of posterior neck, SQ 4cm 03/08/2014  . Chest wall mass - right SQ anterior 5cm 08/21/2013  . Post herpetic neuralgia 07/09/2013  . OSA on CPAP 07/30/2011  . ELEVATED PROSTATE SPECIFIC ANTIGEN 01/13/2010  . LIPOMA, SKIN 08/29/2008  . GERD 08/29/2008  . BENIGN PROSTATIC HYPERTROPHY 08/29/2008    Lavenia Atlas, SPTA 12/01/2020, 2:29 PM  [place]. [place], [place], [address] Phone: [phone]   Fax:  [phone]  Name: Chayce Robbins MRN: [id number] Date of Birth: [birth year]

## 2020-12-03 ENCOUNTER — Ambulatory Visit (INDEPENDENT_AMBULATORY_CARE_PROVIDER_SITE_OTHER): Payer: Medicare Other | Admitting: Family Medicine

## 2020-12-03 ENCOUNTER — Encounter (INDEPENDENT_AMBULATORY_CARE_PROVIDER_SITE_OTHER): Payer: Self-pay | Admitting: Family Medicine

## 2020-12-03 ENCOUNTER — Other Ambulatory Visit: Payer: Self-pay

## 2020-12-03 VITALS — BP 108/60 | HR 70 | Temp 97.5°F | Ht 69.0 in | Wt 209.0 lb

## 2020-12-03 DIAGNOSIS — Z6831 Body mass index (BMI) 31.0-31.9, adult: Secondary | ICD-10-CM

## 2020-12-03 DIAGNOSIS — E669 Obesity, unspecified: Secondary | ICD-10-CM

## 2020-12-03 DIAGNOSIS — R7303 Prediabetes: Secondary | ICD-10-CM | POA: Diagnosis not present

## 2020-12-03 DIAGNOSIS — E559 Vitamin D deficiency, unspecified: Secondary | ICD-10-CM

## 2020-12-04 ENCOUNTER — Encounter: Payer: Self-pay | Admitting: Physical Therapy

## 2020-12-04 ENCOUNTER — Ambulatory Visit: Payer: Medicare Other | Admitting: Physical Therapy

## 2020-12-04 DIAGNOSIS — M25512 Pain in left shoulder: Secondary | ICD-10-CM | POA: Diagnosis not present

## 2020-12-04 DIAGNOSIS — M25612 Stiffness of left shoulder, not elsewhere classified: Secondary | ICD-10-CM

## 2020-12-04 DIAGNOSIS — R600 Localized edema: Secondary | ICD-10-CM | POA: Diagnosis not present

## 2020-12-04 LAB — COMPREHENSIVE METABOLIC PANEL
ALT: 13 IU/L (ref 0–44)
AST: 18 IU/L (ref 0–40)
Albumin/Globulin Ratio: 1.5 (ref 1.2–2.2)
Albumin: 3.9 g/dL (ref 3.7–4.7)
Alkaline Phosphatase: 83 IU/L (ref 44–121)
BUN/Creatinine Ratio: 17 (ref 10–24)
BUN: 17 mg/dL (ref 8–27)
Bilirubin Total: 1.1 mg/dL (ref 0.0–1.2)
CO2: 25 mmol/L (ref 20–29)
Calcium: 9.4 mg/dL (ref 8.6–10.2)
Chloride: 104 mmol/L (ref 96–106)
Creatinine, Ser: 1.02 mg/dL (ref 0.76–1.27)
GFR calc Af Amer: 82 mL/min/{1.73_m2} (ref >59)
GFR calc non Af Amer: 71 mL/min/{1.73_m2} (ref >59)
Globulin, Total: 2.6 g/dL (ref 1.5–4.5)
Glucose: 84 mg/dL (ref 65–99)
Potassium: 4.4 mmol/L (ref 3.5–5.2)
Sodium: 142 mmol/L (ref 134–144)
Total Protein: 6.5 g/dL (ref 6.0–8.5)

## 2020-12-04 LAB — VITAMIN D 25 HYDROXY (VIT D DEFICIENCY, FRACTURES): Vit D, 25-Hydroxy: 73.1 ng/mL (ref 30.0–100.0)

## 2020-12-04 LAB — HEMOGLOBIN A1C
Est. average glucose Bld gHb Est-mCnc: 117 mg/dL
Hgb A1c MFr Bld: 5.7 % — ABNORMAL HIGH (ref 4.8–5.6)

## 2020-12-04 LAB — INSULIN, RANDOM: INSULIN: 5.1 u[IU]/mL (ref 2.6–24.9)

## 2020-12-04 NOTE — Therapy (Signed)
[place]. [place], [place], [address] Phone: [phone]   Fax:  [phone]  Physical Therapy Treatment  Patient Details  Name: [name] [name] MRN: [id number] Date of Birth: 1941-09-02 Referring Provider (PT): Supple   Encounter Date: 12/04/2020   PT End of Session - 12/04/20 1002    Visit Number 7    Date for PT Re-Evaluation 01/14/21    Authorization Type [employer]    PT Start Time 0921    PT Stop Time 1003    PT Time Calculation (min) 42 min    Activity Tolerance Patient tolerated treatment well    Behavior During Therapy [facility] for tasks assessed/performed           Past Medical History:  Diagnosis Date  . Atrial fibrillation ([place])   . BPH (benign prostatic hypertrophy)   . Bradycardia 12/30/2014   HR routinely in mid 40s-50s  . Coronary artery disease (CAD) excluded April 2016   False-positive nuclear stress test suggesting inferior ischemia  . Dysrhythmia   . Elevated PSA   . Encounter for Medicare annual wellness exam 09/22/2013   Sees Dr Wilhemina Bonito of Derm Sees Dr Silvano Rusk of Gastroenterology Sees Dr Kathie Rhodes of [facility] Sees Dr Susa Day of Optometry  . GERD (gastroesophageal reflux disease)    controlled w/ diet and behavioral changes, history of  . Grade I diastolic dysfunction 6433  . History of kidney stones   . Hypertriglyceridemia 03/16/2015  . Lymphadenitis   . Melanoma of back ([place])    Excised Dr Wilhemina Bonito  . Mild ascending aorta dilatation (HCC)   . New onset a-fib ([place]) 03/10/2015  . OA (osteoarthritis)   . Otitis, externa, infective 08/10/2015  . Palpitations   . Post herpetic neuralgia   . Shingles 07/09/2013  . Sleep apnea 07/30/2011   cpap- 13   . Snoring disorder 07/30/2011    Past Surgical History:  Procedure Laterality Date  . CATARACT EXTRACTION Right   . COLONOSCOPY  2018  . CYSTOSCOPY/URETEROSCOPY/HOLMIUM LASER/STENT PLACEMENT Left 10/05/2019    Procedure: CYSTOSCOPY/RETROGRADE/URETEROSCOPY/HOLMIUM LASER/STENT PLACEMENT;  Surgeon: Kathie Rhodes, MD;  Location: [facility];  Service: Urology;  Laterality: Left;  . EYE SURGERY Left 12/06/2017   cataract by Dr Gillian Scarce  . INGUINAL HERNIA REPAIR Bilateral 06/01/2019   Procedure: LAPAROSCOPIC LEFT AND  RIGHT INGUINAL HERNIA REPAIR WITH MESH;  Surgeon: Michael Boston, MD;  Location: [place];  Service: General;  Laterality: Bilateral;  . LEFT HEART CATHETERIZATION WITH CORONARY ANGIOGRAM N/A 04/10/2015   Procedure: LEFT HEART CATHETERIZATION WITH  CORONARY ANGIOGRAM;  Surgeon: Leonie Man, MD;  Location: [facility] CATH LAB;  Service: Cardiovascular;  Angiographicallly NORMAL CORONARY ARTERIES  . MASS EXCISION N/A 05/16/2014   Procedure: EXCISION POSTERIOR NECK MASS, RIGHT CHEST WALL MASS AND RIGHT AXILLARY MASS AXILLARY NODE DISSECTION;  Surgeon: Adin Hector, MD;  Location: [facility];  Service: General;  Laterality: N/A;  . NM MYOVIEW LTD  03/19/2015   FALSE POSITIVE:  INTERMEDIATE RISK. Small sized, moderate intensity inferior ischemic perfusion defect  . REVERSE SHOULDER ARTHROPLASTY Left 10/16/2020   Procedure: REVERSE SHOULDER ARTHROPLASTY;  Surgeon: Justice Britain, MD;  Location: [facility];  Service: Orthopedics;  Laterality: Left;  127mn  . TRANSTHORACIC ECHOCARDIOGRAM  03/20/2015   Normal LV size and function. EF 60-65%. G1 DD. Trivial AI. Borderline aortic root dilation (41 mm), Mild LA dilation.    There were no vitals filed for  this visit.                      [facility] Adult PT Treatment/Exercise - 12/04/20 0946      Shoulder Exercises: Seated   Other Seated Exercises rows and lats 20lb 2x10      Shoulder Exercises: Standing   External Rotation Strengthening;Left;20 reps;Theraband    Theraband Level (Shoulder External Rotation) Level 1 (Yellow)    Internal Rotation Left;Theraband;20 reps;Strengthening    Theraband Level (Shoulder Internal  Rotation) Level 1 (Yellow)    Extension Theraband;20 reps;Both;Strengthening    Theraband Level (Shoulder Extension) Level 2 (Red)    Row 20 reps;Theraband;Both;Strengthening    Theraband Level (Shoulder Row) Level 2 (Red)    Shoulder Elevation Both    Shoulder Elevation Limitations #5 2x10    Other Standing Exercises Red tband triceps 2x15, L biceps curls #3, Behind back passes red ball x10 each way      Shoulder Exercises: ROM/Strengthening   UBE (Upper Arm Bike) L1.7  3 mins each way    Other ROM/Strengthening Exercises AAROM Flex, Ext, Scap, IR up back 2x10 each    2lb bar     Manual Therapy   Manual Therapy Passive ROM    Manual therapy comments All direction to tolerance within protocol    Passive ROM gaurded and needed curing to relax                     PT Short Term Goals - 11/14/20 0956      PT SHORT TERM GOAL #1   Title indepeneent with initial HEP    Time 2    Period Weeks    Status New             PT Long Term Goals - 11/19/20 1010      PT LONG TERM GOAL #1   Title understand protocol and limitations    Status Partially Met                 Plan - 12/04/20 1003    Clinical Impression Statement Pt reports improvement with his shoulder mobility overall. Tactile cues needed to L elbow to keep arm in place with ER/IR. Reports a pulling sensation with lat pul downs. Some pain at the end range of PROM, cues needed to prevent guarding.    Stability/Clinical Decision Making Evolving/Moderate complexity    Rehab Potential Good    PT Frequency 2x / week    PT Duration 8 weeks    PT Treatment/Interventions ADLs/Self Care Home Management;Cryotherapy;Electrical Stimulation;Therapeutic activities;Therapeutic exercise;Patient/family education;Manual techniques;Vasopneumatic Device           Patient will benefit from skilled therapeutic intervention in order to improve the following deficits and impairments:  Decreased range of motion,Increased muscle  spasms,Impaired UE functional use,Pain,Improper body mechanics,Postural dysfunction,Decreased strength,Increased edema  Visit Diagnosis: Stiffness of left shoulder, not elsewhere classified  Localized edema  Acute pain of left shoulder     Problem List Patient Active Problem List   Diagnosis Date Noted  . Preop cardiovascular exam 10/10/2020  . Bilateral hearing loss 09/11/2020  . Muscle cramps 10/14/2019  . Hearing loss of right ear 10/14/2019  . Tinnitus of right ear 10/14/2019  . Bilateral inguinal herniae (BIH) s/p lap repair w mesh 06/01/2019 06/01/2019  . Right Femoral hernia s/p lap repair w mesh 06/01/2019 06/01/2019  . Groin pain, left 12/11/2018  . Obesity 08/18/2017  . Other fatigue 08/18/2017  . Vitamin D  deficiency 08/18/2017  . Hyperglycemia 08/18/2017  . Lymphedema 09/21/2015  . Preventative health care 09/21/2015  . Abnormal exercise myocardial perfusion study 04/02/2015  . Persistent atrial fibrillation (HCC) - CHA2DS2-VASc Score (Age-5, Aortic Plaque - 1) 03/16/2015  . Hypertriglyceridemia 03/16/2015  . Bradycardia 12/30/2014  . Melanoma of back ([place])   . Mass of chest wall, right subfacial, 12cm s/p removal w rib resection 05/16/2014 03/08/2014  . Mass of posterior neck, SQ 4cm 03/08/2014  . Chest wall mass - right SQ anterior 5cm 08/21/2013  . Post herpetic neuralgia 07/09/2013  . OSA on CPAP 07/30/2011  . ELEVATED PROSTATE SPECIFIC ANTIGEN 01/13/2010  . LIPOMA, SKIN 08/29/2008  . GERD 08/29/2008  . BENIGN PROSTATIC HYPERTROPHY 08/29/2008    Scot Jun, PTA 12/04/2020, 10:05 AM  [place]. [place], [place], [address] Phone: [phone]   Fax:  [phone]  Name: [name] [name] MRN: [id number] Date of Birth: 1941-12-13

## 2020-12-04 NOTE — Progress Notes (Signed)
Chief Complaint:   OBESITY [name] [name] is here to discuss his progress with his obesity treatment plan along with follow-up of his obesity related diagnoses. [name] [name] is on the Category 3 Plan and states he is following his eating plan approximately 75-80% of the time. [name] [name] states he is doing house, yard, and shop work.   Today's visit was #: 98 Starting weight: 237 lbs Starting date: 08/18/2017 Today's weight: 209 lbs Today's date: 12/03/2020 Total lbs lost to date: 28 Total lbs lost since last in-office visit: 4  Interim History: [name] [name] has done well with weight loss over Thanksgiving. He notes his hunger is controlled and he is staying active. He is working on decreasing Na+ and increasing protein and vegetables.  Subjective:   1. Pre-diabetes [name] [name] has done well with diet and exercise, and he is due to have labs checked.  2. Vitamin D deficiency [name] [name] is on Vit D OTC, and he is due for labs. He denies nausea, vomiting, or muscle weakness.  Assessment/Plan:   1. Pre-diabetes [name] [name] will continue to work on weight loss, diet, exercise, and decreasing simple carbohydrates to help decrease the risk of diabetes. We will check labs today.  - Comprehensive metabolic panel - Insulin, random - Hemoglobin A1c  2. Vitamin D deficiency Low Vitamin D level contributes to fatigue and are associated with obesity, breast, and colon cancer. We will check labs today. [name] [name] will follow-up for routine testing of Vitamin D, at least 2-3 times per year to avoid over-replacement.  - VITAMIN D 25 Hydroxy (Vit-D Deficiency, Fractures)  3. Class 1 obesity with serious comorbidity and body mass index (BMI) of 31.0 to 31.9 in adult, unspecified obesity type [name] [name] is currently in the action stage of change. As such, his goal is to continue with weight loss efforts. He has agreed to the Category 3 Plan.   Exercise goals: As is.  Behavioral modification strategies: holiday eating strategies .  [name] [name] has  agreed to follow-up with our clinic in 5 to 6 weeks. He was informed of the importance of frequent follow-up visits to maximize his success with intensive lifestyle modifications for his multiple health conditions.   [name] [name] was informed we would discuss his lab results at his next visit unless there is a critical issue that needs to be addressed sooner. [name] [name] agreed to keep his next visit at the agreed upon time to discuss these results.  Objective:   Blood pressure 108/60, pulse 70, temperature (!) 97.5 F (36.4 C), height 5\' 9"  (1.753 m), weight 209 lb (94.8 kg), SpO2 95 %. Body mass index is 30.86 kg/m.  General: Cooperative, alert, well developed, in no acute distress. HEENT: Conjunctivae and lids unremarkable. Cardiovascular: Regular rhythm.  Lungs: Normal work of breathing. Neurologic: No focal deficits.   Lab Results  Component Value Date   CREATININE 0.98 10/10/2020   BUN 25 (H) 10/10/2020   NA 137 10/10/2020   K 4.3 10/10/2020   CL 106 10/10/2020   CO2 24 10/10/2020   Lab Results  Component Value Date   ALT 15 09/11/2020   AST 15 09/11/2020   ALKPHOS 63 07/23/2020   BILITOT 1.1 09/11/2020   Lab Results  Component Value Date   HGBA1C 5.5 07/23/2020   HGBA1C 5.6 04/21/2020   HGBA1C 5.7 (H) 12/31/2019   HGBA1C 5.5 09/10/2019   HGBA1C 5.8 (H) 12/05/2018   Lab Results  Component Value Date   INSULIN 5.2 07/23/2020   INSULIN 5.1 04/21/2020   INSULIN 10.5  12/31/2019   INSULIN 27.4 (H) 09/10/2019   INSULIN 6.7 12/05/2018   Lab Results  Component Value Date   TSH 1.51 09/11/2020   Lab Results  Component Value Date   CHOL 151 07/23/2020   HDL 54 07/23/2020   LDLCALC 81 07/23/2020   TRIG 82 07/23/2020   CHOLHDL 3 10/11/2019   Lab Results  Component Value Date   WBC 6.4 10/10/2020   HGB 14.4 10/10/2020   HCT 42.6 10/10/2020   MCV 96.8 10/10/2020   PLT 172 10/10/2020   No results found for: IRON, TIBC, FERRITIN  Obesity Behavioral Intervention:    Approximately 15 minutes were spent on the discussion below.  ASK: We discussed the diagnosis of obesity with [name] [name] today and [name] [name] agreed to give [name] permission to discuss obesity behavioral modification therapy today.  ASSESS: [name] [name] has the diagnosis of obesity and his BMI today is 30.85. [name] [name] is in the action stage of change.   ADVISE: [name] [name] was educated on the multiple health risks of obesity as well as the benefit of weight loss to improve his health. He was advised of the need for long term treatment and the importance of lifestyle modifications to improve his current health and to decrease his risk of future health problems.  AGREE: Multiple dietary modification options and treatment options were discussed and [name] [name] agreed to follow the recommendations documented in the above note.  ARRANGE: [name] [name] was educated on the importance of frequent visits to treat obesity as outlined per CMS and USPSTF guidelines and agreed to schedule his next follow up appointment today.  Attestation Statements:   Reviewed by clinician on day of visit: allergies, medications, problem list, medical history, surgical history, family history, social history, and previous encounter notes.   I, Trixie Dredge, am acting as transcriptionist for Dennard Nip, MD.  I have reviewed the above documentation for accuracy and completeness, and I agree with the above. -  Dennard Nip, MD

## 2020-12-09 ENCOUNTER — Encounter: Payer: Self-pay | Admitting: Physical Therapy

## 2020-12-09 ENCOUNTER — Ambulatory Visit: Payer: Medicare Other | Admitting: Physical Therapy

## 2020-12-09 ENCOUNTER — Other Ambulatory Visit: Payer: Self-pay

## 2020-12-09 DIAGNOSIS — R600 Localized edema: Secondary | ICD-10-CM | POA: Diagnosis not present

## 2020-12-09 DIAGNOSIS — M25512 Pain in left shoulder: Secondary | ICD-10-CM

## 2020-12-09 DIAGNOSIS — M25612 Stiffness of left shoulder, not elsewhere classified: Secondary | ICD-10-CM | POA: Diagnosis not present

## 2020-12-09 NOTE — Therapy (Signed)
[place] [place]. [place], [place], [address] Phone: [phone]   Fax:  [phone]  Physical Therapy Treatment  Patient Details  Name: [name] [name] MRN: [id number] Date of Birth: 1941-01-06 Referring Provider (PT): Supple   Encounter Date: 12/09/2020   PT End of Session - 12/09/20 0841    Visit Number 8    Date for PT Re-Evaluation 01/14/21    Authorization Type [employer]    PT Start Time 0758    PT Stop Time 0841    PT Time Calculation (min) 43 min    Activity Tolerance Patient tolerated treatment well    Behavior During Therapy [facility] for tasks assessed/performed           Past Medical History:  Diagnosis Date  . Atrial fibrillation ([place])   . BPH (benign prostatic hypertrophy)   . Bradycardia 12/30/2014   HR routinely in mid 40s-50s  . Coronary artery disease (CAD) excluded April 2016   False-positive nuclear stress test suggesting inferior ischemia  . Dysrhythmia   . Elevated PSA   . Encounter for Medicare annual wellness exam 09/22/2013   Sees Dr Wilhemina Bonito of Derm Sees Dr Silvano Rusk of Gastroenterology Sees Dr Kathie Rhodes of [facility] Sees Dr Susa Day of Optometry  . GERD (gastroesophageal reflux disease)    controlled w/ diet and behavioral changes, history of  . Grade I diastolic dysfunction 6237  . History of kidney stones   . Hypertriglyceridemia 03/16/2015  . Lymphadenitis   . Melanoma of back ([place])    Excised Dr Wilhemina Bonito  . Mild ascending aorta dilatation (HCC)   . New onset a-fib ([place]) 03/10/2015  . OA (osteoarthritis)   . Otitis, externa, infective 08/10/2015  . Palpitations   . Post herpetic neuralgia   . Shingles 07/09/2013  . Sleep apnea 07/30/2011   cpap- 13   . Snoring disorder 07/30/2011    Past Surgical History:  Procedure Laterality Date  . CATARACT EXTRACTION Right   . COLONOSCOPY  2018  . CYSTOSCOPY/URETEROSCOPY/HOLMIUM LASER/STENT PLACEMENT Left 10/05/2019    Procedure: CYSTOSCOPY/RETROGRADE/URETEROSCOPY/HOLMIUM LASER/STENT PLACEMENT;  Surgeon: Kathie Rhodes, MD;  Location: [facility];  Service: Urology;  Laterality: Left;  . EYE SURGERY Left 12/06/2017   cataract by Dr Gillian Scarce  . INGUINAL HERNIA REPAIR Bilateral 06/01/2019   Procedure: LAPAROSCOPIC LEFT AND  RIGHT INGUINAL HERNIA REPAIR WITH MESH;  Surgeon: Michael Boston, MD;  Location: [place];  Service: General;  Laterality: Bilateral;  . LEFT HEART CATHETERIZATION WITH CORONARY ANGIOGRAM N/A 04/10/2015   Procedure: LEFT HEART CATHETERIZATION WITH  CORONARY ANGIOGRAM;  Surgeon: Leonie Man, MD;  Location: [facility] CATH LAB;  Service: Cardiovascular;  Angiographicallly NORMAL CORONARY ARTERIES  . MASS EXCISION N/A 05/16/2014   Procedure: EXCISION POSTERIOR NECK MASS, RIGHT CHEST WALL MASS AND RIGHT AXILLARY MASS AXILLARY NODE DISSECTION;  Surgeon: Adin Hector, MD;  Location: [facility];  Service: General;  Laterality: N/A;  . NM MYOVIEW LTD  03/19/2015   FALSE POSITIVE:  INTERMEDIATE RISK. Small sized, moderate intensity inferior ischemic perfusion defect  . REVERSE SHOULDER ARTHROPLASTY Left 10/16/2020   Procedure: REVERSE SHOULDER ARTHROPLASTY;  Surgeon: Justice Britain, MD;  Location: [facility];  Service: Orthopedics;  Laterality: Left;  146mn  . TRANSTHORACIC ECHOCARDIOGRAM  03/20/2015   Normal LV size and function. EF 60-65%. G1 DD. Trivial AI. Borderline aortic root dilation (41 mm), Mild LA dilation.    There were no vitals filed for  this visit.   Subjective Assessment - 12/09/20 0759    Subjective a little sore for a couple hours after last session    Currently in Pain? No/denies                             [facility] Adult PT Treatment/Exercise - 12/09/20 0001      Shoulder Exercises: Standing   External Rotation Strengthening;Left;20 reps;Theraband    Theraband Level (Shoulder External Rotation) Level 2 (Red)    Internal Rotation  Left;Theraband;20 reps;Strengthening    Theraband Level (Shoulder Internal Rotation) Level 2 (Red)    Extension 20 reps;Both;Strengthening;Weights    Extension Weight (lbs) 5    Row 20 reps;Both;Strengthening;Weights    Row Weight (lbs) 10    Other Standing Exercises 1lb Cane AAROM flex, ext, IR up back; 3 3v3l cabinet reaches x10 flex, 1 level cabinet reach abd x10    Other Standing Exercises Tricep ext 15lb 2x10, Biceps curls 10lb 2x10      Shoulder Exercises: ROM/Strengthening   UBE (Upper Arm Bike) L1.7  3 mins each way      Shoulder Exercises: Power Leisure centre manager and lats 15lb 2x10      Manual Therapy   Manual Therapy Passive ROM    Manual therapy comments All direction to tolerance within protocol    Passive ROM gaurded and needed curing to relax                     PT Short Term Goals - 11/14/20 0956      PT SHORT TERM GOAL #1   Title indepeneent with initial HEP    Time 2    Period Weeks    Status New             PT Long Term Goals - 11/19/20 1010      PT LONG TERM GOAL #1   Title understand protocol and limitations    Status Partially Met                 Plan - 12/09/20 0842    Clinical Impression Statement Pt 8 weeks post TSA. Progressed to more strengthening interventions with light resistance. Pt has difficulty keeping elbow in with L shoulder flexion and over head movements. Assist needed with resisted ER due to weakness. Pt L shoulder was tight today's with MT, cues needed to relax.    Stability/Clinical Decision Making Evolving/Moderate complexity    Rehab Potential Good    PT Frequency 2x / week    PT Treatment/Interventions ADLs/Self Care Home Management;Cryotherapy;Electrical Stimulation;Therapeutic activities;Therapeutic exercise;Patient/family education;Manual techniques;Vasopneumatic Device    PT Next Visit Plan week 4-8 of the protocol           Patient will benefit from skilled therapeutic  intervention in order to improve the following deficits and impairments:  Decreased range of motion,Increased muscle spasms,Impaired UE functional use,Pain,Improper body mechanics,Postural dysfunction,Decreased strength,Increased edema  Visit Diagnosis: Acute pain of left shoulder  Localized edema  Stiffness of left shoulder, not elsewhere classified     Problem List Patient Active Problem List   Diagnosis Date Noted  . Preop cardiovascular exam 10/10/2020  . Bilateral hearing loss 09/11/2020  . Muscle cramps 10/14/2019  . Hearing loss of right ear 10/14/2019  . Tinnitus of right ear 10/14/2019  . Bilateral inguinal herniae (BIH) s/p lap repair w mesh 06/01/2019 06/01/2019  . Right Femoral hernia  s/p lap repair w mesh 06/01/2019 06/01/2019  . Groin pain, left 12/11/2018  . Obesity 08/18/2017  . Other fatigue 08/18/2017  . Vitamin D deficiency 08/18/2017  . Hyperglycemia 08/18/2017  . Lymphedema 09/21/2015  . Preventative health care 09/21/2015  . Abnormal exercise myocardial perfusion study 04/02/2015  . Persistent atrial fibrillation (HCC) - CHA2DS2-VASc Score (Age-14, Aortic Plaque - 1) 03/16/2015  . Hypertriglyceridemia 03/16/2015  . Bradycardia 12/30/2014  . Melanoma of back ([place])   . Mass of chest wall, right subfacial, 12cm s/p removal w rib resection 05/16/2014 03/08/2014  . Mass of posterior neck, SQ 4cm 03/08/2014  . Chest wall mass - right SQ anterior 5cm 08/21/2013  . Post herpetic neuralgia 07/09/2013  . OSA on CPAP 07/30/2011  . ELEVATED PROSTATE SPECIFIC ANTIGEN 01/13/2010  . LIPOMA, SKIN 08/29/2008  . GERD 08/29/2008  . BENIGN PROSTATIC HYPERTROPHY 08/29/2008    Scot Jun, PTA 12/09/2020, 8:49 AM  [place]. [place], [place], [address] Phone: [phone]   Fax:  [phone]  Name: [name] [name] MRN: [id number] Date of Birth: January 04, 1941

## 2020-12-10 ENCOUNTER — Other Ambulatory Visit: Payer: Self-pay | Admitting: Family Medicine

## 2020-12-11 ENCOUNTER — Telehealth: Payer: Self-pay | Admitting: Family Medicine

## 2020-12-11 NOTE — Progress Notes (Signed)
  Chronic Care Management   Note  12/11/2020 Name: [name] [name] MRN: [id number] DOB: 1941/02/22  [name] [name] is a 80 y.o. year old male who is a primary care patient of Mosie Lukes, MD. I reached out to [name] by phone today in response to a referral sent by [name] [name]'s PCP, Mosie Lukes, MD.   [name] [name] was given information about Chronic Care Management services today including:  1. CCM service includes personalized support from designated clinical staff supervised by his physician, including individualized plan of care and coordination with other care providers 2. 24/7 contact phone numbers for assistance for urgent and routine care needs. 3. Service will only be billed when office clinical staff spend 20 minutes or more in a month to coordinate care. 4. Only one practitioner may furnish and bill the service in a calendar month. 5. The patient may stop CCM services at any time (effective at the end of the month) by phone call to the office staff.   Patient agreed to services and verbal consent obtained.   Follow up plan:   Carley Perdue [employer] Scheduler

## 2020-12-12 ENCOUNTER — Ambulatory Visit: Payer: Medicare Other | Admitting: Physical Therapy

## 2020-12-12 ENCOUNTER — Encounter: Payer: Self-pay | Admitting: Physical Therapy

## 2020-12-12 ENCOUNTER — Other Ambulatory Visit: Payer: Self-pay

## 2020-12-12 DIAGNOSIS — M25512 Pain in left shoulder: Secondary | ICD-10-CM

## 2020-12-12 DIAGNOSIS — R600 Localized edema: Secondary | ICD-10-CM | POA: Diagnosis not present

## 2020-12-12 DIAGNOSIS — M25612 Stiffness of left shoulder, not elsewhere classified: Secondary | ICD-10-CM

## 2020-12-12 NOTE — Therapy (Signed)
[place] [place], [place], [address] Phone: [phone]   Fax:  [phone]  Physical Therapy Treatment  Patient Details  Name: [name] [name] MRN: [id number] Date of Birth: [birth year] Referring Provider (PT): Supple   Encounter Date: 12/12/2020   PT End of Session - 12/12/20 0841    Visit Number 9    Date for PT Re-Evaluation 01/14/21    Authorization Type [employer]    PT Start Time 0758    PT Stop Time 0841    PT Time Calculation (min) 43 min    Activity Tolerance Patient tolerated treatment well    Behavior During Therapy [facility] for tasks assessed/performed           Past Medical History:  Diagnosis Date  . Atrial fibrillation ([place])   . BPH (benign prostatic hypertrophy)   . Bradycardia 12/30/2014   HR routinely in mid 40s-50s  . Coronary artery disease (CAD) excluded April 2016   False-positive nuclear stress test suggesting inferior ischemia  . Dysrhythmia   . Elevated PSA   . Encounter for Medicare annual wellness exam 09/22/2013   Sees Dr Wilhemina Bonito of Derm Sees Dr Silvano Rusk of Gastroenterology Sees Dr Kathie Rhodes of [facility] Sees Dr Susa Day of Optometry  . GERD (gastroesophageal reflux disease)    controlled w/ diet and behavioral changes, history of  . Grade I diastolic dysfunction 2876  . History of kidney stones   . Hypertriglyceridemia 03/16/2015  . Lymphadenitis   . Melanoma of back ([place])    Excised Dr Wilhemina Bonito  . Mild ascending aorta dilatation (HCC)   . New onset a-fib ([place]) 03/10/2015  . OA (osteoarthritis)   . Otitis, externa, infective 08/10/2015  . Palpitations   . Post herpetic neuralgia   . Shingles 07/09/2013  . Sleep apnea 07/30/2011   cpap- 13   . Snoring disorder 07/30/2011    Past Surgical History:  Procedure Laterality Date  . CATARACT EXTRACTION Right   . COLONOSCOPY  2018  . CYSTOSCOPY/URETEROSCOPY/HOLMIUM LASER/STENT PLACEMENT Left 10/05/2019    Procedure: CYSTOSCOPY/RETROGRADE/URETEROSCOPY/HOLMIUM LASER/STENT PLACEMENT;  Surgeon: Kathie Rhodes, MD;  Location: [facility];  Service: Urology;  Laterality: Left;  . EYE SURGERY Left 12/06/2017   cataract by Dr Gillian Scarce  . INGUINAL HERNIA REPAIR Bilateral 06/01/2019   Procedure: LAPAROSCOPIC LEFT AND  RIGHT INGUINAL HERNIA REPAIR WITH MESH;  Surgeon: Michael Boston, MD;  Location: [place];  Service: General;  Laterality: Bilateral;  . LEFT HEART CATHETERIZATION WITH CORONARY ANGIOGRAM N/A 04/10/2015   Procedure: LEFT HEART CATHETERIZATION WITH  CORONARY ANGIOGRAM;  Surgeon: Leonie Man, MD;  Location: [facility] CATH LAB;  Service: Cardiovascular;  Angiographicallly NORMAL CORONARY ARTERIES  . MASS EXCISION N/A 05/16/2014   Procedure: EXCISION POSTERIOR NECK MASS, RIGHT CHEST WALL MASS AND RIGHT AXILLARY MASS AXILLARY NODE DISSECTION;  Surgeon: Adin Hector, MD;  [facility];  Service: General;  Laterality: N/A;  . NM MYOVIEW LTD  03/19/2015   FALSE POSITIVE:  INTERMEDIATE RISK. Small sized, moderate intensity inferior ischemic perfusion defect  . REVERSE SHOULDER ARTHROPLASTY Left 10/16/2020   Procedure: REVERSE SHOULDER ARTHROPLASTY;  Surgeon: Justice Britain, MD;  [facility];  Service: Orthopedics;  Laterality: Left;  114mn  . TRANSTHORACIC ECHOCARDIOGRAM  03/20/2015   Normal LV size and function. EF 60-65%. G1 DD. Trivial AI. Borderline aortic root dilation (41 mm), Mild LA dilation.    There were no vitals filed for  this visit.   Subjective Assessment - 12/12/20 0802    Subjective Doing good, shoulder might be a little tight this morning    Currently in Pain? Yes    Pain Score 3     Pain Location Shoulder    Pain Orientation Left              [facility] PT Assessment - 12/12/20 0001      AROM   Right/Left Shoulder Left    Left Shoulder Flexion 105 Degrees    Left Shoulder ABduction 81 Degrees                         [facility]  Adult PT Treatment/Exercise - 12/12/20 0001      Shoulder Exercises: Standing   Flexion Strengthening;Both;20 reps;Weights    Shoulder Flexion Weight (lbs) 1    ABduction Strengthening;Both;20 reps;Weights    Shoulder ABduction Weight (lbs) 1    Other Standing Exercises 2lb Cane AAROM flex, ext, IR up back; 3 level cabinet reaches x10 flex & abd    Other Standing Exercises LUE blue tband tricep ext 2x15      Shoulder Exercises: ROM/Strengthening   UBE (Upper Arm Bike) L1.7  3 mins each way      Shoulder Exercises: Power [employer]isure centre manager and lats 15lb 2x10      Manual Therapy   Manual Therapy Passive ROM    Manual therapy comments All direction to tolerance within protocol    Passive ROM gaurded and needed curing to relax                     PT Short Term Goals - 11/14/20 0956      PT SHORT TERM GOAL #1   Title indepeneent with initial HEP    Time 2    Period Weeks    Status New             PT Long Term Goals - 11/19/20 1010      PT LONG TERM GOAL #1   Title understand protocol and limitations    Status Partially Met                 Plan - 12/12/20 0842    Clinical Impression Statement Pt did well in today's session with good effort. Good carryover from last session with machine level interventions. L shoulder elevation noted with standing shoulder flexion and abduction. Cues needed with to relax with MT, some end range tightness with all passive motions.    Stability/Clinical Decision Making Evolving/Moderate complexity    Rehab Potential Good    PT Frequency 2x / week    PT Duration 8 weeks    PT Treatment/Interventions ADLs/Self Care Home Management;Cryotherapy;Electrical Stimulation;Therapeutic activities;Therapeutic exercise;Patient/family education;Manual techniques;Vasopneumatic Device    PT Next Visit Plan week 4-8 of the protocol           Patient will benefit from skilled therapeutic intervention in order to  improve the following deficits and impairments:  Decreased range of motion,Increased muscle spasms,Impaired UE functional use,Pain,Improper body mechanics,Postural dysfunction,Decreased strength,Increased edema  Visit Diagnosis: Acute pain of left shoulder  Stiffness of left shoulder, not elsewhere classified  Localized edema     Problem List Patient Active Problem List   Diagnosis Date Noted  . Preop cardiovascular exam 10/10/2020  . Bilateral hearing loss 09/11/2020  . Muscle cramps 10/14/2019  . Hearing loss of right ear 10/14/2019  .  Tinnitus of right ear 10/14/2019  . Bilateral inguinal herniae (BIH) s/p lap repair w mesh 06/01/2019 06/01/2019  . Right Femoral hernia s/p lap repair w mesh 06/01/2019 06/01/2019  . Groin pain, left 12/11/2018  . Obesity 08/18/2017  . Other fatigue 08/18/2017  . Vitamin D deficiency 08/18/2017  . Hyperglycemia 08/18/2017  . Lymphedema 09/21/2015  . Preventative health care 09/21/2015  . Abnormal exercise myocardial perfusion study 04/02/2015  . Persistent atrial fibrillation (HCC) - CHA2DS2-VASc Score (Age-61, Aortic Plaque - 1) 03/16/2015  . Hypertriglyceridemia 03/16/2015  . Bradycardia 12/30/2014  . Melanoma of back ([place])   . Mass of chest wall, right subfacial, 12cm s/p removal w rib resection 05/16/2014 03/08/2014  . Mass of posterior neck, SQ 4cm 03/08/2014  . Chest wall mass - right SQ anterior 5cm 08/21/2013  . Post herpetic neuralgia 07/09/2013  . OSA on CPAP 07/30/2011  . ELEVATED PROSTATE SPECIFIC ANTIGEN 01/13/2010  . LIPOMA, SKIN 08/29/2008  . GERD 08/29/2008  . BENIGN PROSTATIC HYPERTROPHY 08/29/2008    Scot Jun, PTA 12/12/2020, 8:44 AM  [address]. [place], [place], [address] Phone: [phone]   Fax:  [phone]  Name: [name] [name] MRN: [id number] Date of Birth: Jul 05, 1941

## 2020-12-15 DIAGNOSIS — H60331 Swimmer's ear, right ear: Secondary | ICD-10-CM | POA: Diagnosis not present

## 2020-12-16 ENCOUNTER — Other Ambulatory Visit: Payer: Self-pay

## 2020-12-16 ENCOUNTER — Ambulatory Visit: Payer: Medicare Other | Admitting: Physical Therapy

## 2020-12-16 ENCOUNTER — Encounter: Payer: Self-pay | Admitting: Physical Therapy

## 2020-12-16 DIAGNOSIS — M25612 Stiffness of left shoulder, not elsewhere classified: Secondary | ICD-10-CM | POA: Diagnosis not present

## 2020-12-16 DIAGNOSIS — R600 Localized edema: Secondary | ICD-10-CM | POA: Diagnosis not present

## 2020-12-16 DIAGNOSIS — M25512 Pain in left shoulder: Secondary | ICD-10-CM

## 2020-12-16 NOTE — Patient Instructions (Signed)
Access Code: [email or web address] URL: [email or web address] Date: 12/16/2020 Prepared by: Cheri Fowler  Exercises Shoulder External Rotation with Anchored Resistance - 1 x daily - 7 x weekly - 3 sets - 10 reps Standing Shoulder Abduction Slides at Wall - 1 x daily - 7 x weekly - 3 sets - 10 reps Shoulder Internal Rotation with Resistance - 1 x daily - 7 x weekly - 3 sets - 10 reps Shoulder Flexion Wall Walk - 1 x daily - 7 x weekly - 3 sets - 10 reps

## 2020-12-16 NOTE — Therapy (Signed)
[place] [place], [place], [address] Phone: [phone]   Fax:  [phone] Progress Note Reporting Period 11/14/20 to 12/16/20 for the first 10 visits  See note below for Objective Data and Assessment of Progress/Goals.      Physical Therapy Treatment  Patient Details  Name: [name] [name] MRN: [id number] Date of Birth: 1941/11/28 Referring Provider (PT): Supple   Encounter Date: 12/16/2020   PT End of Session - 12/16/20 0842    Visit Number 10    Date for PT Re-Evaluation 01/14/21    Authorization Type [employer]    PT Start Time 5638    PT Stop Time 0843    PT Time Calculation (min) 44 min    Activity Tolerance Patient tolerated treatment well    Behavior During Therapy [facility] for tasks assessed/performed           Past Medical History:  Diagnosis Date   Atrial fibrillation ([place])    BPH (benign prostatic hypertrophy)    Bradycardia 12/30/2014   HR routinely in mid 40s-50s   Coronary artery disease (CAD) excluded April 2016   False-positive nuclear stress test suggesting inferior ischemia   Dysrhythmia    Elevated PSA    Encounter for Medicare annual wellness exam 09/22/2013   Sees Dr Wilhemina Bonito of Derm Sees Dr Silvano Rusk of Gastroenterology Sees Dr Kathie Rhodes of [facility] Sees Dr Susa Day of Optometry   GERD (gastroesophageal reflux disease)    controlled w/ diet and behavioral changes, history of   Grade I diastolic dysfunction 7564   History of kidney stones    Hypertriglyceridemia 03/16/2015   Lymphadenitis    Melanoma of back ([place])    Excised Dr Wilhemina Bonito   Mild ascending aorta dilatation ([place])    New onset a-fib ([place]) 03/10/2015   OA (osteoarthritis)    Otitis, externa, infective 08/10/2015   Palpitations    Post herpetic neuralgia    Shingles 07/09/2013   Sleep apnea 07/30/2011   cpap- 13    Snoring disorder 07/30/2011    Past Surgical History:   Procedure Laterality Date   CATARACT EXTRACTION Right    COLONOSCOPY  2018   CYSTOSCOPY/URETEROSCOPY/HOLMIUM LASER/STENT PLACEMENT Left 10/05/2019   Procedure: CYSTOSCOPY/RETROGRADE/URETEROSCOPY/HOLMIUM LASER/STENT PLACEMENT;  Surgeon: Kathie Rhodes, MD;  Location: [place];  Service: Urology;  Laterality: Left;   EYE SURGERY Left 12/06/2017   cataract by Dr Logan Bores HERNIA REPAIR Bilateral 06/01/2019   Procedure: LAPAROSCOPIC LEFT AND  RIGHT INGUINAL HERNIA REPAIR WITH MESH;  Surgeon: Michael Boston, MD;  Location: [place];  Service: General;  Laterality: Bilateral;   LEFT HEART CATHETERIZATION WITH CORONARY ANGIOGRAM N/A 04/10/2015   Procedure: LEFT HEART CATHETERIZATION WITH  CORONARY ANGIOGRAM;  Surgeon: Leonie Man, MD;  Location: [facility] CATH LAB;  Service: Cardiovascular;  Angiographicallly NORMAL CORONARY ARTERIES   MASS EXCISION N/A 05/16/2014   Procedure: EXCISION POSTERIOR NECK MASS, RIGHT CHEST WALL MASS AND RIGHT AXILLARY MASS AXILLARY NODE DISSECTION;  Surgeon: Adin Hector, MD;  [facility];  Service: General;  Laterality: N/A;   NM MYOVIEW LTD  03/19/2015   FALSE POSITIVE:  INTERMEDIATE RISK. Small sized, moderate intensity inferior ischemic perfusion defect   REVERSE SHOULDER ARTHROPLASTY Left 10/16/2020   Procedure: REVERSE SHOULDER ARTHROPLASTY;  Surgeon: Justice Britain, MD;  [facility];  Service: Orthopedics;  Laterality: Left;  137mn   TRANSTHORACIC ECHOCARDIOGRAM  03/20/2015   Normal LV  size and function. EF 60-65%. G1 DD. Trivial AI. Borderline aortic root dilation (41 mm), Mild LA dilation.    There were no vitals filed for this visit.   Subjective Assessment - 12/16/20 0758    Subjective doing good    Currently in Pain? No/denies                             [facility] Adult PT Treatment/Exercise - 12/16/20 0001      Shoulder Exercises: Standing   External Rotation  Strengthening;Left;20 reps;Theraband    Theraband Level (Shoulder External Rotation) Level 2 (Red)    Internal Rotation Left;Theraband;20 reps;Strengthening    Theraband Level (Shoulder Internal Rotation) Level 2 (Red)    Flexion Strengthening;Both;20 reps;Weights    Shoulder Flexion Weight (lbs) 2    ABduction Strengthening;Both;20 reps;Weights    Shoulder ABduction Weight (lbs) 2    Other Standing Exercises Flex & abd wall wakks x10    Other Standing Exercises red ball behnd the back passes x15 each      Shoulder Exercises: ROM/Strengthening   UBE (Upper Arm Bike) L1.7  3 mins each way      Shoulder Exercises: Power [employer]   Other Power [employer] Exercises Rows and lats 20lb 2x10      Manual Therapy   Manual Therapy Passive ROM    Manual therapy comments All direction to tolerance within protocol    Passive ROM gaurded and needed curing to relax                     PT Short Term Goals - 11/14/20 0956      PT SHORT TERM GOAL #1   Title indepeneent with initial HEP    Time 2    Period Weeks    Status New             PT Long Term Goals - 11/19/20 1010      PT LONG TERM GOAL #1   Title understand protocol and limitations    Status Partially Met                 Plan - 12/16/20 0842    Clinical Impression Statement Pt did well in today's session. L shoulder elevation present with flexion and abduction. increase weigh tolerated with seated rows and lats. Some soreness reported with seated lat pull downs. Assist needed with shoulder ER, pt has the motion but is tow weak to reach his full range without assist. Cues a times to prevent guarding with PROM.    Stability/Clinical Decision Making Evolving/Moderate complexity    Rehab Potential Good    PT Frequency 2x / week    PT Duration 8 weeks    PT Treatment/Interventions ADLs/Self Care Home Management;Cryotherapy;Electrical Stimulation;Therapeutic activities;Therapeutic exercise;Patient/family education;Manual  techniques;Vasopneumatic Device    PT Next Visit Plan week 4-8 of the protocol           Patient will benefit from skilled therapeutic intervention in order to improve the following deficits and impairments:  Decreased range of motion,Increased muscle spasms,Impaired UE functional use,Pain,Improper body mechanics,Postural dysfunction,Decreased strength,Increased edema  Visit Diagnosis: Localized edema  Stiffness of left shoulder, not elsewhere classified  Acute pain of left shoulder     Problem List Patient Active Problem List   Diagnosis Date Noted   Preop cardiovascular exam 10/10/2020   Bilateral hearing loss 09/11/2020   Muscle cramps 10/14/2019   Hearing loss of right ear 10/14/2019  Tinnitus of right ear 10/14/2019   Bilateral inguinal herniae (BIH) s/p lap repair w mesh 06/01/2019 06/01/2019   Right Femoral hernia s/p lap repair w mesh 06/01/2019 06/01/2019   Groin pain, left 12/11/2018   Obesity 08/18/2017   Other fatigue 08/18/2017   Vitamin D deficiency 08/18/2017   Hyperglycemia 08/18/2017   Lymphedema 09/21/2015   Preventative health care 09/21/2015   Abnormal exercise myocardial perfusion study 04/02/2015   Persistent atrial fibrillation (HCC) - CHA2DS2-VASc Score (Age-69, Aortic Plaque - 1) 03/16/2015   Hypertriglyceridemia 03/16/2015   Bradycardia 12/30/2014   Melanoma of back ([place])    Mass of chest wall, right subfacial, 12cm s/p removal w rib resection 05/16/2014 03/08/2014   Mass of posterior neck, SQ 4cm 03/08/2014   Chest wall mass - right SQ anterior 5cm 08/21/2013   Post herpetic neuralgia 07/09/2013   OSA on CPAP 07/30/2011   ELEVATED PROSTATE SPECIFIC ANTIGEN 01/13/2010   LIPOMA, SKIN 08/29/2008   GERD 08/29/2008   BENIGN PROSTATIC HYPERTROPHY 08/29/2008    Scot Jun, PTA 12/16/2020, 8:45 AM  [place]. [place], [place], [address] Phone:  [phone]   Fax:  [phone]  Name: Claude Waldman MRN: [id number] Date of Birth: 04/13/1941

## 2020-12-18 ENCOUNTER — Encounter: Payer: Self-pay | Admitting: Physical Therapy

## 2020-12-18 ENCOUNTER — Other Ambulatory Visit: Payer: Self-pay

## 2020-12-18 ENCOUNTER — Ambulatory Visit: Payer: Medicare Other | Admitting: Physical Therapy

## 2020-12-18 DIAGNOSIS — R600 Localized edema: Secondary | ICD-10-CM | POA: Diagnosis not present

## 2020-12-18 DIAGNOSIS — M25612 Stiffness of left shoulder, not elsewhere classified: Secondary | ICD-10-CM

## 2020-12-18 DIAGNOSIS — M25512 Pain in left shoulder: Secondary | ICD-10-CM

## 2020-12-18 NOTE — Therapy (Addendum)
[address]. [place], [place], [address] Phone: [phone]   Fax:  [phone]  Physical Therapy Treatment  Patient Details  Name: [name] [name] MRN: [id number] Date of Birth: 09-Jul-1941 Referring Provider (PT): Supple   Encounter Date: 12/18/2020   PT End of Session - 12/18/20 0840    Visit Number 11    Date for PT Re-Evaluation 01/14/21    Authorization Type [employer]    PT Start Time 0800    PT Stop Time 0842    PT Time Calculation (min) 42 min    Activity Tolerance Patient tolerated treatment well    Behavior During Therapy [facility] for tasks assessed/performed           Past Medical History:  Diagnosis Date  . Atrial fibrillation ([place])   . BPH (benign prostatic hypertrophy)   . Bradycardia 12/30/2014   HR routinely in mid 40s-50s  . Coronary artery disease (CAD) excluded April 2016   False-positive nuclear stress test suggesting inferior ischemia  . Dysrhythmia   . Elevated PSA   . Encounter for Medicare annual wellness exam 09/22/2013   Sees Dr Wilhemina Bonito of Derm Sees Dr Silvano Rusk of Gastroenterology Sees Dr Kathie Rhodes of [facility] Sees Dr Susa Day of Optometry  . GERD (gastroesophageal reflux disease)    controlled w/ diet and behavioral changes, history of  . Grade I diastolic dysfunction 6415  . History of kidney stones   . Hypertriglyceridemia 03/16/2015  . Lymphadenitis   . Melanoma of back ([place])    Excised Dr Wilhemina Bonito  . Mild ascending aorta dilatation (HCC)   . New onset a-fib ([place]) 03/10/2015  . OA (osteoarthritis)   . Otitis, externa, infective 08/10/2015  . Palpitations   . Post herpetic neuralgia   . Shingles 07/09/2013  . Sleep apnea 07/30/2011   cpap- 13   . Snoring disorder 07/30/2011    Past Surgical History:  Procedure Laterality Date  . CATARACT EXTRACTION Right   . COLONOSCOPY  2018  . CYSTOSCOPY/URETEROSCOPY/HOLMIUM LASER/STENT PLACEMENT Left 10/05/2019    Procedure: CYSTOSCOPY/RETROGRADE/URETEROSCOPY/HOLMIUM LASER/STENT PLACEMENT;  Surgeon: Kathie Rhodes, MD;  Location: [facility];  Service: Urology;  Laterality: Left;  . EYE SURGERY Left 12/06/2017   cataract by Dr Gillian Scarce  . INGUINAL HERNIA REPAIR Bilateral 06/01/2019   Procedure: LAPAROSCOPIC LEFT AND  RIGHT INGUINAL HERNIA REPAIR WITH MESH;  Surgeon: Michael Boston, MD;  Location: [place];  Service: General;  Laterality: Bilateral;  . LEFT HEART CATHETERIZATION WITH CORONARY ANGIOGRAM N/A 04/10/2015   Procedure: LEFT HEART CATHETERIZATION WITH  CORONARY ANGIOGRAM;  Surgeon: Leonie Man, MD;  Location: [facility] CATH LAB;  Service: Cardiovascular;  Angiographicallly NORMAL CORONARY ARTERIES  . MASS EXCISION N/A 05/16/2014   Procedure: EXCISION POSTERIOR NECK MASS, RIGHT CHEST WALL MASS AND RIGHT AXILLARY MASS AXILLARY NODE DISSECTION;  Surgeon: Adin Hector, MD;  [facility];  Service: General;  Laterality: N/A;  . NM MYOVIEW LTD  03/19/2015   FALSE POSITIVE:  INTERMEDIATE RISK. Small sized, moderate intensity inferior ischemic perfusion defect  . REVERSE SHOULDER ARTHROPLASTY Left 10/16/2020   Procedure: REVERSE SHOULDER ARTHROPLASTY;  Surgeon: Justice Britain, MD;  [facility];  Service: Orthopedics;  Laterality: Left;  138mn  . TRANSTHORACIC ECHOCARDIOGRAM  03/20/2015   Normal LV size and function. EF 60-65%. G1 DD. Trivial AI. Borderline aortic root dilation (41 mm), Mild LA dilation.    There were no vitals filed for  this visit.   Subjective Assessment - 12/18/20 0801    Subjective "I am good"    Currently in Pain? No/denies                             [facility] Adult PT Treatment/Exercise - 12/18/20 0001      Shoulder Exercises: Standing   Flexion Strengthening;Both;20 reps;Weights    Shoulder Flexion Weight (lbs) 1    ABduction Strengthening;Both;20 reps;Weights    Shoulder ABduction Weight (lbs) 1    Extension 20  reps;Both;Strengthening;Weights    Extension Weight (lbs) 5    Other Standing Exercises Flex 2lb & abd up wall pillow case x10    Other Standing Exercises red ball behnd the back passes x15 each, Bicep curls 5lb 2x10      Shoulder Exercises: ROM/Strengthening   UBE (Upper Arm Bike) L1.7  3 mins each way      Shoulder Exercises: Power [employer]   Other Power [employer] Exercises Rows 2x15 and lats 20lb 2x10      Manual Therapy   Manual Therapy Passive ROM    Manual therapy comments All direction to tolerance within protocol    Passive ROM gaurded and needed curing to relax                     PT Short Term Goals - 11/14/20 0956      PT SHORT TERM GOAL #1   Title indepeneent with initial HEP    Time 2    Period Weeks    Status New             PT Long Term Goals - 11/19/20 1010      PT LONG TERM GOAL #1   Title understand protocol and limitations    Status Partially Met                 Plan - 12/18/20 0840    Clinical Impression Statement Pt was very guarded today's with PROM and end rang stretching, he had difficulty relaxing with cues. Lat pull down still causes some L shoulder discomfort. Progressed with bicep curls without issues. He did require some initial assist with 2lb wall flexion at times due times getting the motion started.    Stability/Clinical Decision Making Evolving/Moderate complexity    Rehab Potential Good    PT Frequency 2x / week    PT Duration 8 weeks    PT Treatment/Interventions ADLs/Self Care Home Management;Cryotherapy;Electrical Stimulation;Therapeutic activities;Therapeutic exercise;Patient/family education;Manual techniques;Vasopneumatic Device    PT Next Visit Plan week 4-8 of the protocol           Patient will benefit from skilled therapeutic intervention in order to improve the following deficits and impairments:  Decreased range of motion,Increased muscle spasms,Impaired UE functional use,Pain,Improper body mechanics,Postural  dysfunction,Decreased strength,Increased edema  Visit Diagnosis: Stiffness of left shoulder, not elsewhere classified  Localized edema  Acute pain of left shoulder     Problem List Patient Active Problem List   Diagnosis Date Noted  . Preop cardiovascular exam 10/10/2020  . Bilateral hearing loss 09/11/2020  . Muscle cramps 10/14/2019  . Hearing loss of right ear 10/14/2019  . Tinnitus of right ear 10/14/2019  . Bilateral inguinal herniae (BIH) s/p lap repair w mesh 06/01/2019 06/01/2019  . Right Femoral hernia s/p lap repair w mesh 06/01/2019 06/01/2019  . Groin pain, left 12/11/2018  . Obesity 08/18/2017  . Other fatigue 08/18/2017  .  Vitamin D deficiency 08/18/2017  . Hyperglycemia 08/18/2017  . Lymphedema 09/21/2015  . Preventative health care 09/21/2015  . Abnormal exercise myocardial perfusion study 04/02/2015  . Persistent atrial fibrillation (HCC) - CHA2DS2-VASc Score (Age-79, Aortic Plaque - 1) 03/16/2015  . Hypertriglyceridemia 03/16/2015  . Bradycardia 12/30/2014  . Melanoma of back ([place])   . Mass of chest wall, right subfacial, 12cm s/p removal w rib resection 05/16/2014 03/08/2014  . Mass of posterior neck, SQ 4cm 03/08/2014  . Chest wall mass - right SQ anterior 5cm 08/21/2013  . Post herpetic neuralgia 07/09/2013  . OSA on CPAP 07/30/2011  . ELEVATED PROSTATE SPECIFIC ANTIGEN 01/13/2010  . LIPOMA, SKIN 08/29/2008  . GERD 08/29/2008  . BENIGN PROSTATIC HYPERTROPHY 08/29/2008   PHYSICAL THERAPY DISCHARGE SUMMARY  Visits from Start of Care: 11 Plan: Patient agrees to discharge.  Patient goals were partially met. Patient is being discharged due to being pleased with the current functional level.  ?????     Scot Jun, PTA 12/18/2020, 8:43 AM  [place]. [place], [place], [address] Phone: [phone]   Fax:  [phone]  Name: [name] [name] MRN: [id number] Date of Birth:  Jun 05, 1941

## 2020-12-24 DIAGNOSIS — Z85820 Personal history of malignant melanoma of skin: Secondary | ICD-10-CM | POA: Diagnosis not present

## 2020-12-24 DIAGNOSIS — D1801 Hemangioma of skin and subcutaneous tissue: Secondary | ICD-10-CM | POA: Diagnosis not present

## 2020-12-24 DIAGNOSIS — D225 Melanocytic nevi of trunk: Secondary | ICD-10-CM | POA: Diagnosis not present

## 2020-12-24 DIAGNOSIS — L821 Other seborrheic keratosis: Secondary | ICD-10-CM | POA: Diagnosis not present

## 2020-12-24 DIAGNOSIS — L400 Psoriasis vulgaris: Secondary | ICD-10-CM | POA: Diagnosis not present

## 2020-12-25 ENCOUNTER — Encounter: Payer: Self-pay | Admitting: Family Medicine

## 2020-12-25 ENCOUNTER — Other Ambulatory Visit: Payer: Self-pay | Admitting: Family Medicine

## 2020-12-25 DIAGNOSIS — I83892 Varicose veins of left lower extremities with other complications: Secondary | ICD-10-CM

## 2020-12-30 DIAGNOSIS — R350 Frequency of micturition: Secondary | ICD-10-CM | POA: Diagnosis not present

## 2020-12-30 DIAGNOSIS — N200 Calculus of kidney: Secondary | ICD-10-CM | POA: Diagnosis not present

## 2020-12-30 DIAGNOSIS — N281 Cyst of kidney, acquired: Secondary | ICD-10-CM | POA: Diagnosis not present

## 2021-01-07 ENCOUNTER — Encounter (INDEPENDENT_AMBULATORY_CARE_PROVIDER_SITE_OTHER): Payer: Self-pay

## 2021-01-07 ENCOUNTER — Ambulatory Visit (INDEPENDENT_AMBULATORY_CARE_PROVIDER_SITE_OTHER): Payer: Medicare Other | Admitting: Family Medicine

## 2021-01-07 DIAGNOSIS — Z96612 Presence of left artificial shoulder joint: Secondary | ICD-10-CM | POA: Diagnosis not present

## 2021-01-14 ENCOUNTER — Other Ambulatory Visit: Payer: Self-pay

## 2021-01-14 DIAGNOSIS — I8390 Asymptomatic varicose veins of unspecified lower extremity: Secondary | ICD-10-CM

## 2021-01-27 DIAGNOSIS — N200 Calculus of kidney: Secondary | ICD-10-CM | POA: Diagnosis not present

## 2021-01-27 DIAGNOSIS — K449 Diaphragmatic hernia without obstruction or gangrene: Secondary | ICD-10-CM | POA: Diagnosis not present

## 2021-01-27 DIAGNOSIS — K5730 Diverticulosis of large intestine without perforation or abscess without bleeding: Secondary | ICD-10-CM | POA: Diagnosis not present

## 2021-01-27 DIAGNOSIS — N281 Cyst of kidney, acquired: Secondary | ICD-10-CM | POA: Diagnosis not present

## 2021-02-02 ENCOUNTER — Telehealth: Payer: Medicare Other

## 2021-02-03 ENCOUNTER — Telehealth (INDEPENDENT_AMBULATORY_CARE_PROVIDER_SITE_OTHER): Payer: Self-pay | Admitting: Family Medicine

## 2021-02-03 NOTE — Telephone Encounter (Signed)
Patients wife called and asked could she come in with her husband.  He has new kidney issues and she wants to make sure the eating plan works well with the new kidney issues.  She is fully vaccinated.  She says he doesn't relay all the information to her after his visits.

## 2021-02-04 ENCOUNTER — Encounter (INDEPENDENT_AMBULATORY_CARE_PROVIDER_SITE_OTHER): Payer: Self-pay

## 2021-02-04 ENCOUNTER — Other Ambulatory Visit: Payer: Self-pay

## 2021-02-04 ENCOUNTER — Encounter (INDEPENDENT_AMBULATORY_CARE_PROVIDER_SITE_OTHER): Payer: Self-pay | Admitting: Family Medicine

## 2021-02-04 ENCOUNTER — Ambulatory Visit (INDEPENDENT_AMBULATORY_CARE_PROVIDER_SITE_OTHER): Payer: Medicare Other | Admitting: Family Medicine

## 2021-02-04 VITALS — BP 126/81 | HR 62 | Temp 97.8°F | Ht 69.0 in | Wt 214.0 lb

## 2021-02-04 DIAGNOSIS — Z6831 Body mass index (BMI) 31.0-31.9, adult: Secondary | ICD-10-CM | POA: Diagnosis not present

## 2021-02-04 DIAGNOSIS — E669 Obesity, unspecified: Secondary | ICD-10-CM

## 2021-02-04 DIAGNOSIS — N200 Calculus of kidney: Secondary | ICD-10-CM | POA: Diagnosis not present

## 2021-02-04 NOTE — Progress Notes (Signed)
Chief Complaint:   OBESITY [name] [name] is here to discuss his progress with his obesity treatment plan along with follow-up of his obesity related diagnoses. [name] [name] is on the Category 3 Plan and states he is following his eating plan approximately 75-80% of the time. [name] [name] states he is doing generalized activity around the house.   Today's visit was #: 48 Starting weight: 237 lbs Starting date: 08/18/2017 Today's weight: 214 lbs Today's date: 02/04/2021 Total lbs lost to date: 23 Total lbs lost since last in-office visit: 0  Interim History: [name] [name] has been on vacations on a cruise and he did some celebration eating. He still made sure to maximize his protein.  Subjective:   1. Nephrolithiasis [name] [name] recently had small stones in his kidney, which were non-obstructive and expected to pass on their own. His wife has questions about foods and meal plans that may help.  Assessment/Plan:   1. Nephrolithiasis [name] [name] is to continue his Category 3 plan, and he was instructed to increase his water as he tends to be dehydrated.  2. Class 1 obesity with serious comorbidity and body mass index (BMI) of 31.0 to 31.9 in adult, unspecified obesity type [name] [name] is currently in the action stage of change. As such, his goal is to continue with weight loss efforts. He has agreed to the Category 3 Plan.   Exercise goals: As is.  Behavioral modification strategies: increasing lean protein intake.  [name] [name] has agreed to follow-up with our clinic in 8 weeks. He was informed of the importance of frequent follow-up visits to maximize his success with intensive lifestyle modifications for his multiple health conditions.   Objective:   Blood pressure 126/81, pulse 62, temperature 97.8 F (36.6 C), height 5\' 9"  (1.753 m), weight 214 lb (97.1 kg), SpO2 98 %. Body mass index is 31.6 kg/m.  General: Cooperative, alert, well developed, in no acute distress. HEENT: Conjunctivae and lids  unremarkable. Cardiovascular: Regular rhythm.  Lungs: Normal work of breathing. Neurologic: No focal deficits.   Lab Results  Component Value Date   CREATININE 1.02 12/03/2020   BUN 17 12/03/2020   NA 142 12/03/2020   K 4.4 12/03/2020   CL 104 12/03/2020   CO2 25 12/03/2020   Lab Results  Component Value Date   ALT 13 12/03/2020   AST 18 12/03/2020   ALKPHOS 83 12/03/2020   BILITOT 1.1 12/03/2020   Lab Results  Component Value Date   HGBA1C 5.7 (H) 12/03/2020   HGBA1C 5.5 07/23/2020   HGBA1C 5.6 04/21/2020   HGBA1C 5.7 (H) 12/31/2019   HGBA1C 5.5 09/10/2019   Lab Results  Component Value Date   INSULIN 5.1 12/03/2020   INSULIN 5.2 07/23/2020   INSULIN 5.1 04/21/2020   INSULIN 10.5 12/31/2019   INSULIN 27.4 (H) 09/10/2019   Lab Results  Component Value Date   TSH 1.51 09/11/2020   Lab Results  Component Value Date   CHOL 151 07/23/2020   HDL 54 07/23/2020   LDLCALC 81 07/23/2020   TRIG 82 07/23/2020   CHOLHDL 3 10/11/2019   Lab Results  Component Value Date   WBC 6.4 10/10/2020   HGB 14.4 10/10/2020   HCT 42.6 10/10/2020   MCV 96.8 10/10/2020   PLT 172 10/10/2020   No results found for: IRON, TIBC, FERRITIN  Attestation Statements:   Reviewed by clinician on day of visit: allergies, medications, problem list, medical history, surgical history, family history, social history, and previous encounter notes.  Time spent on visit  including pre-visit chart review and post-visit care and charting was 30 minutes.    I, Trixie Dredge, am acting as transcriptionist for Dennard Nip, MD.  I have reviewed the above documentation for accuracy and completeness, and I agree with the above. -  Dennard Nip, MD

## 2021-02-04 NOTE — Telephone Encounter (Signed)
ok 

## 2021-02-04 NOTE — Telephone Encounter (Signed)
MyChart message sent to pt this am.

## 2021-02-09 ENCOUNTER — Ambulatory Visit (HOSPITAL_COMMUNITY)
Admission: RE | Admit: 2021-02-09 | Discharge: 2021-02-09 | Disposition: A | Discharge status: Home / Self Care | Payer: Medicare Other | Source: Ambulatory Visit | Attending: Surgery | Admitting: Surgery

## 2021-02-09 ENCOUNTER — Ambulatory Visit (INDEPENDENT_AMBULATORY_CARE_PROVIDER_SITE_OTHER): Payer: Medicare Other | Admitting: Physician Assistant

## 2021-02-09 ENCOUNTER — Other Ambulatory Visit: Payer: Self-pay

## 2021-02-09 VITALS — BP 132/90 | HR 78 | Temp 98.3°F | Resp 16 | Ht 70.0 in | Wt 225.1 lb

## 2021-02-09 DIAGNOSIS — I8390 Asymptomatic varicose veins of unspecified lower extremity: Secondary | ICD-10-CM

## 2021-02-09 DIAGNOSIS — I8393 Asymptomatic varicose veins of bilateral lower extremities: Secondary | ICD-10-CM

## 2021-02-09 DIAGNOSIS — I8392 Asymptomatic varicose veins of left lower extremity: Secondary | ICD-10-CM | POA: Diagnosis not present

## 2021-02-09 NOTE — Progress Notes (Signed)
[facility]           [facility] [place]  History and Physical   [name] [name] is a 80 y.o. male who presents with varicose veins of the left leg.  He states that about 6-8 months ago, he had a spot on the lower part of the anterior portion of the leg that bled.  This has not bled since, but the scab is still present.  He states he wears a band-aid over it to help from knocking it and possibly making it bleed again.  He states he went to the dermatologist and he would not remove it and recommended he see vascular for this issue.    He denies any swelling.  He has not ever had a DVT.  He does not recall any family hx of VV.  He has only worn compression socks when he has flown.  He does not have any skin changes.  He does not have any issues in the right leg.   He has hx of persistent afib and is on Eliquis, OSA on CPAP.  Pt accompanied by his wife of 54 years.  His daughter is a Software engineer at [employer] and manages coumadin.    The pt is not on a statin for cholesterol management.  The pt is not on a daily aspirin.   Other AC:  Eliquis The pt is not on medication for hypertension.   The pt is not diabetic.   Tobacco hx:  former   Past Medical History:  Diagnosis Date  . Atrial fibrillation ([place])   . BPH (benign prostatic hypertrophy)   . Bradycardia 12/30/2014   HR routinely in mid 40s-50s  . Coronary artery disease (CAD) excluded April 2016   False-positive nuclear stress test suggesting inferior ischemia  . Dysrhythmia   . Elevated PSA   . Encounter for Medicare annual wellness exam 09/22/2013   Sees Dr Wilhemina Bonito of Derm Sees Dr Silvano Rusk of Gastroenterology Sees Dr Kathie Rhodes of [facility] Sees Dr Susa Day of Optometry  . GERD (gastroesophageal reflux disease)    controlled w/ diet and behavioral changes, history of  . Grade I diastolic dysfunction 0240  . History of kidney stones   . Hypertriglyceridemia 03/16/2015  .  Lymphadenitis   . Melanoma of back ([place])    Excised Dr Wilhemina Bonito  . Mild ascending aorta dilatation ([facility])   . New onset a-fib ([place]) 03/10/2015  . OA (osteoarthritis)   . Otitis, externa, infective 08/10/2015  . Palpitations   . Post herpetic neuralgia   . Shingles 07/09/2013  . Sleep apnea 07/30/2011   cpap- 13   . Snoring disorder 07/30/2011    Past Surgical History:  Procedure Laterality Date  . CATARACT EXTRACTION Right   . COLONOSCOPY  2018  . CYSTOSCOPY/URETEROSCOPY/HOLMIUM LASER/STENT PLACEMENT Left 10/05/2019   Procedure: CYSTOSCOPY/RETROGRADE/URETEROSCOPY/HOLMIUM LASER/STENT PLACEMENT;  Surgeon: Kathie Rhodes, MD;  Location: [facility];  Service: Urology;  Laterality: Left;  . EYE SURGERY Left 12/06/2017   cataract by Dr Gillian Scarce  . INGUINAL HERNIA REPAIR Bilateral 06/01/2019   Procedure: LAPAROSCOPIC LEFT AND  RIGHT INGUINAL HERNIA REPAIR WITH MESH;  Surgeon: Michael Boston, MD;  Location: [facility];  Service: General;  Laterality: Bilateral;  . LEFT HEART CATHETERIZATION WITH CORONARY ANGIOGRAM N/A 04/10/2015   Procedure: LEFT HEART CATHETERIZATION WITH  CORONARY ANGIOGRAM;  Surgeon: Leonie Man, MD;  Location: [facility];  Service: Cardiovascular;  Angiographicallly NORMAL CORONARY ARTERIES  . MASS EXCISION N/A 05/16/2014   Procedure: EXCISION POSTERIOR NECK MASS, RIGHT CHEST WALL MASS AND RIGHT AXILLARY MASS AXILLARY NODE DISSECTION;  Surgeon: Adin Hector, MD;  Location: [facility];  Service: General;  Laterality: N/A;  . NM MYOVIEW LTD  03/19/2015   FALSE POSITIVE:  INTERMEDIATE RISK. Small sized, moderate intensity inferior ischemic perfusion defect  . REVERSE SHOULDER ARTHROPLASTY Left 10/16/2020   Procedure: REVERSE SHOULDER ARTHROPLASTY;  Surgeon: Justice Britain, MD;  Location: [facility];  Service: Orthopedics;  Laterality: Left;  170min  . TRANSTHORACIC ECHOCARDIOGRAM  03/20/2015   Normal LV size and function. EF 60-65%. G1 DD. Trivial AI.  Borderline aortic root dilation (41 mm), Mild LA dilation.    Social History   Socioeconomic History  . Marital status: Married    Spouse name: [name] [name]  . Number of children: 2  . Years of education: Not on file  . Highest education level: Not on file  Occupational History  . Occupation: retired    Comment: truck Geophysicist/field seismologist  Tobacco Use  . Smoking status: Former Smoker    Years: 27.00    Types: Pipe, Cigars    Quit date: 02/11/1989    Years since quitting: 32.0  . Smokeless tobacco: Never Used  Vaping Use  . Vaping Use: Never used  Substance and Sexual Activity  . Alcohol use: Yes    Alcohol/week: 7.0 standard drinks    Types: 7 Glasses of wine per week    Comment: 1 per day -- brandy or wine  . Drug use: No  . Sexual activity: Yes    Comment: lives with wife, retired from [employer] driving, no dietary restrictions  Other Topics Concern  . Not on file  Social History Narrative   He is a married father of 2, and grandfather of 19. He has been married to his wife [name] [name] for 51 years. He previously lived in [place] but has moved with his wife to [place], [place] to be close to his daughter [name] [name] and her family. 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 He quit smoking in 1990. He has 6-7 glasses of brandy or wine a week.    He exercises regularly with low impact aerobics 2 days a week and daily walks of 30-45 minutes. His exercise sessions or 60 minutes. He will do some type of exercise at least 7 days a week and is very active doing yard work and wood work.      Patient is right-handed. He lives with his wife in a one level home with a basement. He drinks 4-5 cups of decaf coffee a day. He and his wife go to the gym 1-2 x a week.   Social Determinants of Health   Financial Resource Strain: Low Risk   . Difficulty of Paying Living Expenses: Not hard at all  Food Insecurity: No Food Insecurity  .  Worried About Charity fundraiser in the Last Year: Never true  . Ran Out of Food in the Last Year: Never true  Transportation Needs: No Transportation Needs  . Lack of Transportation (Medical): No  . Lack of Transportation (Non-Medical): No  Physical Activity: Not on file  Stress: Not on file  Social Connections: Not on file  Intimate Partner Violence: Not on file    Family History  Problem Relation Age of Onset  . Cancer Mother  63       MM, leukemia  . Obesity Mother   . Emphysema Father 43  . Obesity Father   . Alcohol abuse Father   . Cancer Sister 6       brain  . COPD Brother 93  . Heart disease Paternal Grandmother   . COPD Paternal Grandfather   . Diabetes Sister 59  . Obesity Sister   . Down syndrome Brother 16       aspirated  . Alcohol abuse Other   . Colon cancer Neg Hx     Current Outpatient Medications  Medication Sig Dispense Refill  . Cholecalciferol (VITAMIN D3) 125 MCG (5000 UT) CAPS Take 1 capsule (5,000 Units total) by mouth daily. 30 capsule 0  . ELIQUIS 5 MG TABS tablet Take 1 tablet by mouth twice daily 180 tablet 1  . gabapentin (NEURONTIN) 300 MG capsule Take 3 capsules (900 mg total) by mouth 3 (three) times daily. 270 capsule 3  . Multiple Vitamin (MULTIVITAMIN WITH MINERALS) TABS tablet Take 1 tablet by mouth daily.      No current facility-administered medications for this visit.    No Known Allergies  REVIEW OF SYSTEMS:   [X]  denotes positive finding, [ ]  denotes negative finding Cardiac  Comments:  Chest pain or chest pressure:    Shortness of breath upon exertion:    Short of breath when lying flat:    Irregular heart rhythm:        Vascular    Pain in calf, thigh, or hip brought on by ambulation:    Pain in feet at night that wakes you up from your sleep:     Blood clot in your veins:    Leg swelling:         Pulmonary    Oxygen at home:    Productive cough:     Wheezing:         Neurologic    Sudden weakness in arms  or legs:     Sudden numbness in arms or legs:     Sudden onset of difficulty speaking or slurred speech:    Temporary loss of vision in one eye:     Problems with dizziness:         Gastrointestinal    Blood in stool:     Vomited blood:         Genitourinary    Burning when urinating:     Blood in urine:        Psychiatric    Major depression:         Hematologic    Bleeding problems:    Problems with blood clotting too easily:        Skin    Rashes or ulcers:        Constitutional    Fever or chills:      PHYSICAL EXAMINATION:  Today's Vitals   02/09/21 1347  BP: 132/90  Pulse: 78  Resp: 16  Temp: 98.3 F (36.8 C)  TempSrc: Temporal  SpO2: 100%  Weight: 225 lb 1.6 oz (102.1 kg)  Height: 5\' 10"  (1.778 m)   Body mass index is 32.3 kg/m.   General:  WDWN in NAD; vital signs documented above Gait: Not observed HENT: WNL, normocephalic Pulmonary: normal non-labored breathing without wheezing Cardiac: irregular HR; without carotid bruits Abdomen: soft, NT, no masses; aortic pulse is not palpable Skin: without rashes Vascular Exam/Pulses:  Right Left  Radial 2+ (normal) 2+ (normal)  DP  2+ (normal) 2+ (normal)  PT 2+ (normal) 2+ (normal)   Extremities: without ischemic changes, without cellulitis; without open wounds; without skin color changes      Musculoskeletal: no muscle wasting or atrophy  Neurologic: A&O X 3;  moving all extremities equally Psychiatric:  The pt has Normal affect.   Non-Invasive Vascular Imaging:   Venous duplex on 02/09/2021: +--------------+---------+------+-----------+------------+-----------------  LEFT     Reflux NoRefluxReflux TimeDiameter cmsComments                   Yes                       +--------------+---------+------+-----------+------------+-----------------  CFV            yes  >1 second                  +--------------+---------+------+-----------+------------+-----------------  FV mid          yes  >1 second                 +--------------+---------+------+-----------+------------+-----------------  Popliteal         yes  >1 second                 +--------------+---------+------+-----------+------------+-----------------  GSV at SFJ        yes  >500 ms   1.07            +--------------+---------+------+-----------+------------+-----------------   GSV prox thigh      yes  >500 ms   1.6   slow flow      +--------------+---------+------+-----------+------------+-----------------  GSV mid thigh       yes  >500 ms   0.89            +--------------+---------+------+-----------+------------+-----------------   GSV dist thigh      yes  >500 ms   1.24            +--------------+---------+------+-----------+------------+-----------------  GSV at knee        yes  >500 ms   0.90            +--------------+---------+------+-----------+------------+-----------------  GSV prox calf          >500 ms   1.36   narrows to 0.32 Cm with reflux then enlarges      +--------------+---------+------+-----------+------------+-----------------  GSV mid calf       yes  >500 ms   0.48            +--------------+---------+------+-----------+------------+-----------------  SSV Pop Fossa       yes  >500 ms   0.33   distal thigh and joins        +--------------+---------+------+-----------+------------+-----------------  SSV prox calf       yes  >500 ms   0.49            +--------------+---------+------+-----------+------------+-----------------  SSV mid calf       yes  >500 ms   0.54             +--------------+---------+------+-----------+------------+-----------------   Summary:  Left:  - No evidence of deep vein thrombosis seen in the left lower extremity,  from the common femoral through the popliteal veins.  - Venous reflux is noted in the left common femoral vein.  - Venous reflux is noted in the left sapheno-femoral junction.  - Venous reflux is noted in the left greater saphenous vein in the thigh.  - Venous reflux is noted in the left greater saphenous vein in the calf.  - Venous reflux is  noted in the left femoral vein.  - Venous reflux is noted in the left popliteal vein.  - Venous reflux is noted in the left short saphenous vein.  -Roleaux flow noted throughout the left lower extremity    [name] [name] is a 80 y.o. male who presents with: varicose veins of the left leg with scab with hx of bleeding.   Pt does have venous reflux in the deep and superficial system and his vein measures 0.89cm-1.6cm with reflux throughout the left GSV and left SSV.  -discussed with pt about wearing thigh high 20-70mmHg compression stockings and he was measured for these today.   -discussed the importance of leg elevation and how to elevate.    -also discussed with pt to elevate leg and hold pressure should this scab bleed again.  He understands that he would probably need to hold pressure a little longer given he is on Eliquis.  He will protect area with band-aid when applying compression sock.   -discussed importance of weight loss and avoid sitting or standing for long periods of time.  Hand out given.  -pt will f/u 3 months but possibly sooner given bleeding scab.   Leontine Locket, PAC] [facility] 02/09/2021 1:44 PM  Clinic MD:  Trula Slade

## 2021-02-10 DIAGNOSIS — R350 Frequency of micturition: Secondary | ICD-10-CM | POA: Diagnosis not present

## 2021-02-10 DIAGNOSIS — N281 Cyst of kidney, acquired: Secondary | ICD-10-CM | POA: Diagnosis not present

## 2021-02-10 DIAGNOSIS — Z87442 Personal history of urinary calculi: Secondary | ICD-10-CM | POA: Diagnosis not present

## 2021-02-17 ENCOUNTER — Encounter: Payer: Self-pay | Admitting: Family Medicine

## 2021-02-17 ENCOUNTER — Other Ambulatory Visit: Payer: Self-pay | Admitting: Family Medicine

## 2021-02-17 DIAGNOSIS — M545 Low back pain, unspecified: Secondary | ICD-10-CM

## 2021-02-18 ENCOUNTER — Other Ambulatory Visit: Payer: Self-pay

## 2021-02-18 ENCOUNTER — Ambulatory Visit (HOSPITAL_BASED_OUTPATIENT_CLINIC_OR_DEPARTMENT_OTHER)
Admission: RE | Admit: 2021-02-18 | Discharge: 2021-02-18 | Disposition: A | Discharge status: Home / Self Care | Payer: Medicare Other | Source: Ambulatory Visit | Attending: Family Medicine | Admitting: Family Medicine

## 2021-02-18 DIAGNOSIS — M160 Bilateral primary osteoarthritis of hip: Secondary | ICD-10-CM | POA: Diagnosis not present

## 2021-02-18 DIAGNOSIS — M545 Low back pain, unspecified: Secondary | ICD-10-CM | POA: Diagnosis not present

## 2021-02-19 DIAGNOSIS — M5416 Radiculopathy, lumbar region: Secondary | ICD-10-CM | POA: Diagnosis not present

## 2021-02-19 DIAGNOSIS — M9902 Segmental and somatic dysfunction of thoracic region: Secondary | ICD-10-CM | POA: Diagnosis not present

## 2021-02-19 DIAGNOSIS — M5137 Other intervertebral disc degeneration, lumbosacral region: Secondary | ICD-10-CM | POA: Diagnosis not present

## 2021-02-19 DIAGNOSIS — M9903 Segmental and somatic dysfunction of lumbar region: Secondary | ICD-10-CM | POA: Diagnosis not present

## 2021-02-19 DIAGNOSIS — M9905 Segmental and somatic dysfunction of pelvic region: Secondary | ICD-10-CM | POA: Diagnosis not present

## 2021-03-24 ENCOUNTER — Encounter: Payer: Medicare Other | Admitting: Family Medicine

## 2021-04-01 ENCOUNTER — Ambulatory Visit (INDEPENDENT_AMBULATORY_CARE_PROVIDER_SITE_OTHER): Payer: Medicare Other | Admitting: Family Medicine

## 2021-04-08 ENCOUNTER — Other Ambulatory Visit: Payer: Self-pay | Admitting: Family Medicine

## 2021-04-20 DIAGNOSIS — M5416 Radiculopathy, lumbar region: Secondary | ICD-10-CM | POA: Diagnosis not present

## 2021-04-20 DIAGNOSIS — M9903 Segmental and somatic dysfunction of lumbar region: Secondary | ICD-10-CM | POA: Diagnosis not present

## 2021-04-20 DIAGNOSIS — M9902 Segmental and somatic dysfunction of thoracic region: Secondary | ICD-10-CM | POA: Diagnosis not present

## 2021-04-20 DIAGNOSIS — M9905 Segmental and somatic dysfunction of pelvic region: Secondary | ICD-10-CM | POA: Diagnosis not present

## 2021-04-20 DIAGNOSIS — M5137 Other intervertebral disc degeneration, lumbosacral region: Secondary | ICD-10-CM | POA: Diagnosis not present

## 2021-04-23 DIAGNOSIS — M9902 Segmental and somatic dysfunction of thoracic region: Secondary | ICD-10-CM | POA: Diagnosis not present

## 2021-04-23 DIAGNOSIS — M9903 Segmental and somatic dysfunction of lumbar region: Secondary | ICD-10-CM | POA: Diagnosis not present

## 2021-04-23 DIAGNOSIS — M5137 Other intervertebral disc degeneration, lumbosacral region: Secondary | ICD-10-CM | POA: Diagnosis not present

## 2021-04-23 DIAGNOSIS — M9905 Segmental and somatic dysfunction of pelvic region: Secondary | ICD-10-CM | POA: Diagnosis not present

## 2021-04-23 DIAGNOSIS — M5416 Radiculopathy, lumbar region: Secondary | ICD-10-CM | POA: Diagnosis not present

## 2021-04-28 DIAGNOSIS — M9902 Segmental and somatic dysfunction of thoracic region: Secondary | ICD-10-CM | POA: Diagnosis not present

## 2021-04-28 DIAGNOSIS — M9903 Segmental and somatic dysfunction of lumbar region: Secondary | ICD-10-CM | POA: Diagnosis not present

## 2021-04-28 DIAGNOSIS — M5416 Radiculopathy, lumbar region: Secondary | ICD-10-CM | POA: Diagnosis not present

## 2021-04-28 DIAGNOSIS — M5137 Other intervertebral disc degeneration, lumbosacral region: Secondary | ICD-10-CM | POA: Diagnosis not present

## 2021-04-28 DIAGNOSIS — M9905 Segmental and somatic dysfunction of pelvic region: Secondary | ICD-10-CM | POA: Diagnosis not present

## 2021-05-04 ENCOUNTER — Other Ambulatory Visit: Payer: Self-pay | Admitting: Cardiology

## 2021-05-05 NOTE — Telephone Encounter (Signed)
81m, 102.1kg, scr 1.02 12/03/20, lovw/harding 10/09/20

## 2021-05-06 ENCOUNTER — Other Ambulatory Visit: Payer: Self-pay | Admitting: Family Medicine

## 2021-05-07 ENCOUNTER — Ambulatory Visit: Payer: Medicare Other | Admitting: Vascular Surgery

## 2021-05-07 DIAGNOSIS — M5137 Other intervertebral disc degeneration, lumbosacral region: Secondary | ICD-10-CM | POA: Diagnosis not present

## 2021-05-07 DIAGNOSIS — M9905 Segmental and somatic dysfunction of pelvic region: Secondary | ICD-10-CM | POA: Diagnosis not present

## 2021-05-07 DIAGNOSIS — M5416 Radiculopathy, lumbar region: Secondary | ICD-10-CM | POA: Diagnosis not present

## 2021-05-07 DIAGNOSIS — M9902 Segmental and somatic dysfunction of thoracic region: Secondary | ICD-10-CM | POA: Diagnosis not present

## 2021-05-07 DIAGNOSIS — M9903 Segmental and somatic dysfunction of lumbar region: Secondary | ICD-10-CM | POA: Diagnosis not present

## 2021-05-18 ENCOUNTER — Ambulatory Visit: Payer: Medicare Other | Attending: Internal Medicine

## 2021-05-18 DIAGNOSIS — Z23 Encounter for immunization: Secondary | ICD-10-CM

## 2021-05-18 NOTE — Progress Notes (Signed)
   Covid-19 Vaccination Clinic  Name:  Husam Hohn    MRN: [id number] DOB: 1941-06-28  05/18/2021  Mr. Macpherson was observed post Covid-19 immunization for 15 minutes without incident. He was provided with Vaccine Information Sheet and instruction to access the V-Safe system.   Mr. Joynt was instructed to call 911 with any severe reactions post vaccine: Marland Kitchen Difficulty breathing  . Swelling of face and throat  . A fast heartbeat  . A bad rash all over body  . Dizziness and weakness   Immunizations Administered    Name Date Dose VIS Date Route   PFIZER Comrnaty(Gray TOP) Covid-19 Vaccine 05/18/2021 11:55 AM 0.3 mL 12/04/2020 Intramuscular   Manufacturer: [employer], [employer]   Lot: [id number]   NDC: [phone]

## 2021-05-19 ENCOUNTER — Ambulatory Visit (INDEPENDENT_AMBULATORY_CARE_PROVIDER_SITE_OTHER): Payer: Medicare Other | Admitting: Family Medicine

## 2021-05-19 ENCOUNTER — Encounter (INDEPENDENT_AMBULATORY_CARE_PROVIDER_SITE_OTHER): Payer: Self-pay | Admitting: Family Medicine

## 2021-05-19 ENCOUNTER — Other Ambulatory Visit: Payer: Self-pay

## 2021-05-19 VITALS — BP 116/71 | HR 62 | Temp 97.5°F | Ht 69.0 in | Wt 216.0 lb

## 2021-05-19 DIAGNOSIS — R7303 Prediabetes: Secondary | ICD-10-CM | POA: Diagnosis not present

## 2021-05-19 DIAGNOSIS — Z6835 Body mass index (BMI) 35.0-35.9, adult: Secondary | ICD-10-CM | POA: Diagnosis not present

## 2021-05-20 ENCOUNTER — Encounter: Payer: Self-pay | Admitting: Vascular Surgery

## 2021-05-20 ENCOUNTER — Ambulatory Visit: Payer: Medicare Other | Admitting: Vascular Surgery

## 2021-05-20 VITALS — BP 107/72 | HR 86 | Temp 98.0°F | Resp 20 | Ht 69.0 in | Wt 221.0 lb

## 2021-05-20 DIAGNOSIS — I872 Venous insufficiency (chronic) (peripheral): Secondary | ICD-10-CM

## 2021-05-20 DIAGNOSIS — I83812 Varicose veins of left lower extremities with pain: Secondary | ICD-10-CM

## 2021-05-20 NOTE — Progress Notes (Signed)
Chief Complaint:   OBESITY [name] [name] is here to discuss his progress with his obesity treatment plan along with follow-up of his obesity related diagnoses. [name] [name] is on the Category 3 Plan and states he is following his eating plan approximately 50% of the time. [name] [name] states he is doing 0 minutes 0 times per week.  Today's visit was #: 52 Starting weight: 237 lbs Starting date: 08/18/2017 Today's weight: 215 lbs Today's date: 05/19/2021 Total lbs lost to date: 22 Total lbs lost since last in-office visit: 0  Interim History: [name] [name] has been out of town caring for his son and his 2 granddaughters while his son was in the ICU. He is always mindful of his food choices, but has had to eat out more. He had gained approximately 6 lbs, but has already lost 5 of them.  Subjective:   1. Pre-diabetes [name] [name] is working on decreasing simple carbohydrates, and staying active. He is not on metformin.  Assessment/Plan:   1. Pre-diabetes [name] [name] will continue to work on weight loss, diet, exercise, and decreasing simple carbohydrates to help decrease the risk of diabetes. We will plan to recheck labs in 4-6 weeks.  2. Obesity with current BMI 31.9 [name] [name] is currently in the action stage of change. As such, his goal is to continue with weight loss efforts. He has agreed to the Category 3 Plan or keeping a food journal and adhering to recommended goals of 1500-1700 calories and 100+ grams of protein daily.   Eating out handout was given today.  Behavioral modification strategies: meal planning and cooking strategies.  [name] [name] has agreed to follow-up with our clinic in 4 weeks. He was informed of the importance of frequent follow-up visits to maximize his success with intensive lifestyle modifications for his multiple health conditions.   Objective:   Blood pressure 116/71, pulse 62, temperature (!) 97.5 F (36.4 C), height 5\' 9"  (1.753 m), weight 216 lb (98 kg), SpO2 98 %. Body mass index is 31.9  kg/m.  General: Cooperative, alert, well developed, in no acute distress. HEENT: Conjunctivae and lids unremarkable. Cardiovascular: Regular rhythm.  Lungs: Normal work of breathing. Neurologic: No focal deficits.   Lab Results  Component Value Date   CREATININE 1.02 12/03/2020   BUN 17 12/03/2020   NA 142 12/03/2020   K 4.4 12/03/2020   CL 104 12/03/2020   CO2 25 12/03/2020   Lab Results  Component Value Date   ALT 13 12/03/2020   AST 18 12/03/2020   ALKPHOS 83 12/03/2020   BILITOT 1.1 12/03/2020   Lab Results  Component Value Date   HGBA1C 5.7 (H) 12/03/2020   HGBA1C 5.5 07/23/2020   HGBA1C 5.6 04/21/2020   HGBA1C 5.7 (H) 12/31/2019   HGBA1C 5.5 09/10/2019   Lab Results  Component Value Date   INSULIN 5.1 12/03/2020   INSULIN 5.2 07/23/2020   INSULIN 5.1 04/21/2020   INSULIN 10.5 12/31/2019   INSULIN 27.4 (H) 09/10/2019   Lab Results  Component Value Date   TSH 1.51 09/11/2020   Lab Results  Component Value Date   CHOL 151 07/23/2020   HDL 54 07/23/2020   LDLCALC 81 07/23/2020   TRIG 82 07/23/2020   CHOLHDL 3 10/11/2019   Lab Results  Component Value Date   WBC 6.4 10/10/2020   HGB 14.4 10/10/2020   HCT 42.6 10/10/2020   MCV 96.8 10/10/2020   PLT 172 10/10/2020   No results found for: IRON, TIBC, FERRITIN  Attestation Statements:  Reviewed by clinician on day of visit: allergies, medications, problem list, medical history, surgical history, family history, social history, and previous encounter notes.  Time spent on visit including pre-visit chart review and post-visit care and charting was 33 minutes.    I, Trixie Dredge, am acting as transcriptionist for Dennard Nip, MD.  I have reviewed the above documentation for accuracy and completeness, and I agree with the above. -  Dennard Nip, MD

## 2021-05-20 NOTE — Progress Notes (Signed)
REASON FOR VISIT:   77-month follow-up visit.  MEDICAL ISSUES:   PAINFUL VARICOSE VEINS OF THE LEFT LOWER EXTREMITY: This patient has CEAP C2 venous disease.  He has markedly dilated varicose veins under significant pressure and had significant bleeding from a telangiectasia in his left leg.  He has been doing conservative measures including thigh-high compression stockings, elevation, and exercise.  Given the risk for recurrent bleeding episodes I think he would benefit from ligation and stripping of the left great saphenous vein with greater than 20 stab phlebectomies.  We could also consider laser ablation of the left great saphenous vein but the vein was less than a centimeter from the skin and the mid to distal thigh which would put him at higher risk for pigmentation, scarring, and skin inflammation.  He is agreeable to ligation and stripping which I think is the best option here.  We have discussed the indications for the procedure and the potential complications of surgery including not limited to bleeding and bruising.  We will also require 1 unit of sclerotherapy for the area that had bled.  My main goal with ligation and stripping and stab phlebectomies is to lower the venous pressure on the left to lower his risk of future bleeding episodes.   HPI:   [name] [name] is a pleasant 80 y.o. male who was seen by Liana Crocker, PA on 02/09/2021 with painful varicose veins of the left leg.  He did reportedly have 1 bleeding episode related to his varicose veins.  He has no previous history of DVT.  Of note he is on Eliquis.  He has large dilated varicose veins of the left leg as documented in the photographs on 02/09/2021.  He was prescribed thigh-high compression stockings with a gradient of 20 to 30 mmHg, encouraged to elevate his legs and exercise.  He comes in for 72-month follow-up visit.  On my history the patient has not had significant symptoms related to his varicose veins.  His main  concern was a significant bleeding episode in which there was significant blood loss.  This was from a telangiectasia on the anterior left leg.  He has had no further bleeding episodes.  He has been wearing his thigh-high compression stockings.  He is elevating his legs some.  He does exercise.  He has not required ibuprofen.   Past Medical History:  Diagnosis Date  . Atrial fibrillation ([place])   . BPH (benign prostatic hypertrophy)   . Bradycardia 12/30/2014   HR routinely in mid 40s-50s  . Coronary artery disease (CAD) excluded April 2016   False-positive nuclear stress test suggesting inferior ischemia  . Dysrhythmia   . Elevated PSA   . Encounter for Medicare annual wellness exam 09/22/2013   Sees Dr Wilhemina Bonito of Derm Sees Dr Silvano Rusk of Gastroenterology Sees Dr Kathie Rhodes of [facility] Sees Dr Susa Day of Optometry  . GERD (gastroesophageal reflux disease)    controlled w/ diet and behavioral changes, history of  . Grade I diastolic dysfunction 7619  . History of kidney stones   . Hypertriglyceridemia 03/16/2015  . Lymphadenitis   . Melanoma of back ([place])    Excised Dr Wilhemina Bonito  . Mild ascending aorta dilatation ([facility])   . New onset a-fib ([place]) 03/10/2015  . OA (osteoarthritis)   . Otitis, externa, infective 08/10/2015  . Palpitations   . Post herpetic neuralgia   . Shingles 07/09/2013  . Sleep apnea 07/30/2011   cpap- 13   .  Snoring disorder 07/30/2011    Family History  Problem Relation Age of Onset  . Cancer Mother 63       MM, leukemia  . Obesity Mother   . Emphysema Father 3  . Obesity Father   . Alcohol abuse Father   . Cancer Sister 47       brain  . COPD Brother 18  . Heart disease Paternal Grandmother   . COPD Paternal Grandfather   . Diabetes Sister 97  . Obesity Sister   . Down syndrome Brother 29       aspirated  . Alcohol abuse Other   . Colon cancer Neg Hx     SOCIAL HISTORY: Social History   Tobacco Use  . Smoking status:  Former Smoker    Years: 27.00    Types: Pipe, Cigars    Quit date: 02/11/1989    Years since quitting: 32.2  . Smokeless tobacco: Never Used  Substance Use Topics  . Alcohol use: Yes    Alcohol/week: 7.0 standard drinks    Types: 7 Glasses of wine per week    Comment: 1 per day -- brandy or wine    No Known Allergies  Current Outpatient Medications  Medication Sig Dispense Refill  . Cholecalciferol (VITAMIN D3) 125 MCG (5000 UT) CAPS Take 1 capsule (5,000 Units total) by mouth daily. 30 capsule 0  . ELIQUIS 5 MG TABS tablet Take 1 tablet by mouth twice daily 180 tablet 1  . gabapentin (NEURONTIN) 300 MG capsule TAKE 3 CAPSULES BY MOUTH THREE TIMES DAILY 270 capsule 0  . Multiple Vitamin (MULTIVITAMIN WITH MINERALS) TABS tablet Take 1 tablet by mouth daily.      No current facility-administered medications for this visit.    REVIEW OF SYSTEMS:  [X]  denotes positive finding, [ ]  denotes negative finding Cardiac  Comments:  Chest pain or chest pressure:    Shortness of breath upon exertion:    Short of breath when lying flat:    Irregular heart rhythm:        Vascular    Pain in calf, thigh, or hip brought on by ambulation:    Pain in feet at night that wakes you up from your sleep:     Blood clot in your veins:    Leg swelling:         Pulmonary    Oxygen at home:    Productive cough:     Wheezing:         Neurologic    Sudden weakness in arms or legs:     Sudden numbness in arms or legs:     Sudden onset of difficulty speaking or slurred speech:    Temporary loss of vision in one eye:     Problems with dizziness:         Gastrointestinal    Blood in stool:     Vomited blood:         Genitourinary    Burning when urinating:     Blood in urine:        Psychiatric    Major depression:         Hematologic    Bleeding problems:    Problems with blood clotting too easily:        Skin    Rashes or ulcers:        Constitutional    Fever or chills:      PHYSICAL EXAM:   Vitals:   05/20/21 0859  BP: 107/72  Pulse: 86  Resp: 20  Temp: 98 F (36.7 C)  SpO2: 99%  Weight: 221 lb (100.2 kg)  Height: 5\' 9"  (1.753 m)    GENERAL: The patient is a well-nourished male, in no acute distress. The vital signs are documented above. CARDIAC: There is a regular rate and rhythm.  VASCULAR: I do not detect carotid bruits. He has palpable dorsalis pedis pulses bilaterally. He has large dilated varicose veins along the medial aspect of his left calf and also his posterior left calf as documented in the photographs below.      PULMONARY: There is good air exchange bilaterally without wheezing or rales. ABDOMEN: Soft and non-tender with normal pitched bowel sounds.  MUSCULOSKELETAL: There are no major deformities or cyanosis. NEUROLOGIC: No focal weakness or paresthesias are detected. SKIN: There are no ulcers or rashes noted. PSYCHIATRIC: The patient has a normal affect.  DATA:    VENOUS DUPLEX: I reviewed his previous duplex scan of the left lower extremity.  This showed no evidence of DVT on the left.  He did have significant deep venous reflux involving the common femoral vein femoral vein and popliteal vein.  He also had significant superficial venous reflux involving the great saphenous vein from the saphenofemoral junction to the proximal calf.  Diameters range from 9 to 16 mm.  He also had reflux in the small saphenous vein with diameters ranging from 5 to 5.5 mm.  A total of 40 minutes was spent on this visit. 20 minutes was face to face time. More than 50% of the time was spent on counseling and coordinating with the patient.    Deitra Mayo [facility] [phone]

## 2021-05-26 ENCOUNTER — Other Ambulatory Visit (HOSPITAL_BASED_OUTPATIENT_CLINIC_OR_DEPARTMENT_OTHER): Payer: Self-pay

## 2021-05-26 MED ORDER — PFIZER-BIONT COVID-19 VAC-TRIS 30 MCG/0.3ML IM SUSP
INTRAMUSCULAR | 0 refills | Status: DC
Start: 2021-05-18 — End: 2021-08-05
  Filled 2021-05-26: qty 0.3, 1d supply, fill #0

## 2021-06-03 ENCOUNTER — Other Ambulatory Visit: Payer: Self-pay

## 2021-06-07 ENCOUNTER — Other Ambulatory Visit: Payer: Self-pay | Admitting: Family Medicine

## 2021-06-22 ENCOUNTER — Other Ambulatory Visit: Payer: Self-pay

## 2021-06-22 ENCOUNTER — Ambulatory Visit (INDEPENDENT_AMBULATORY_CARE_PROVIDER_SITE_OTHER): Payer: Medicare Other | Admitting: Family Medicine

## 2021-06-22 ENCOUNTER — Encounter (INDEPENDENT_AMBULATORY_CARE_PROVIDER_SITE_OTHER): Payer: Self-pay | Admitting: Family Medicine

## 2021-06-22 VITALS — BP 120/72 | HR 56 | Temp 97.6°F | Ht 69.0 in | Wt 209.0 lb

## 2021-06-22 DIAGNOSIS — E781 Pure hyperglyceridemia: Secondary | ICD-10-CM

## 2021-06-22 DIAGNOSIS — E559 Vitamin D deficiency, unspecified: Secondary | ICD-10-CM

## 2021-06-22 DIAGNOSIS — Z6835 Body mass index (BMI) 35.0-35.9, adult: Secondary | ICD-10-CM | POA: Diagnosis not present

## 2021-06-22 DIAGNOSIS — R7303 Prediabetes: Secondary | ICD-10-CM | POA: Diagnosis not present

## 2021-06-23 LAB — CMP14+EGFR
ALT: 17 IU/L (ref 0–44)
AST: 20 IU/L (ref 0–40)
Albumin/Globulin Ratio: 1.8 (ref 1.2–2.2)
Albumin: 4.2 g/dL (ref 3.7–4.7)
Alkaline Phosphatase: 62 IU/L (ref 44–121)
BUN/Creatinine Ratio: 22 (ref 10–24)
BUN: 22 mg/dL (ref 8–27)
Bilirubin Total: 0.7 mg/dL (ref 0.0–1.2)
CO2: 25 mmol/L (ref 20–29)
Calcium: 9.5 mg/dL (ref 8.6–10.2)
Chloride: 103 mmol/L (ref 96–106)
Creatinine, Ser: 0.98 mg/dL (ref 0.76–1.27)
Globulin, Total: 2.3 g/dL (ref 1.5–4.5)
Glucose: 92 mg/dL (ref 65–99)
Potassium: 4.6 mmol/L (ref 3.5–5.2)
Sodium: 140 mmol/L (ref 134–144)
Total Protein: 6.5 g/dL (ref 6.0–8.5)
eGFR: 79 mL/min/{1.73_m2} (ref >59)

## 2021-06-23 LAB — LIPID PANEL WITH LDL/HDL RATIO
Cholesterol, Total: 147 mg/dL (ref 100–199)
HDL: 57 mg/dL (ref >39)
LDL Chol Calc (NIH): 77 mg/dL (ref 0–99)
LDL/HDL Ratio: 1.4 ratio (ref 0.0–3.6)
Triglycerides: 62 mg/dL (ref 0–149)
VLDL Cholesterol Cal: 13 mg/dL (ref 5–40)

## 2021-06-23 LAB — HEMOGLOBIN A1C
Est. average glucose Bld gHb Est-mCnc: 114 mg/dL
Hgb A1c MFr Bld: 5.6 % (ref 4.8–5.6)

## 2021-06-23 LAB — INSULIN, RANDOM: INSULIN: 6.2 u[IU]/mL (ref 2.6–24.9)

## 2021-06-23 LAB — VITAMIN D 25 HYDROXY (VIT D DEFICIENCY, FRACTURES): Vit D, 25-Hydroxy: 66.9 ng/mL (ref 30.0–100.0)

## 2021-06-30 NOTE — Progress Notes (Signed)
Chief Complaint:   OBESITY [name] [name] is here to discuss his progress with his obesity treatment plan along with follow-up of his obesity related diagnoses. [name] [name] is on the Category 3 Plan or keeping a food journal and adhering to recommended goals of 1500-1700 calories and 100+ grams of protein daily and states he is following his eating plan approximately 90% of the time. 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  Today's visit was #: 88 Starting weight: 237 lbs Starting date: 08/18/2017 Today's weight: 209 lbs Today's date: 06/22/2021 Total lbs lost to date: 28 Total lbs lost since last in-office visit: 7  Interim History: [name] [name] has been back to his normal routine after doing a lot of traveling earlier this year. He has done very well with losing the weight he gained, and he is staying very busy with his wood working and yard work.  Subjective:   1. Pre-diabetes [name] [name] is doing well with diet and weight loss, and he is due for labs.  2. Vitamin D deficiency [name] [name] is on OTC Vit D, and he is due to have his labs checked. He denies signs of over-replacement.  3. Hypertriglyceridemia [name] [name] is doing well with diet and weight loss. He is due to have his labs checked.  Assessment/Plan:   1. Pre-diabetes We will check labs today. [name] [name] will continue to work on diet, exercise, and decreasing simple carbohydrates to help decrease the risk of diabetes.   - CMP14+EGFR - Hemoglobin A1c - Insulin, random  2. Vitamin D deficiency Low Vitamin D level contributes to fatigue and are associated with obesity, breast, and colon cancer. We will check labs today. [name] [name] will follow-up for routine testing of Vitamin D, at least 2-3 times per year to avoid over-replacement.  - VITAMIN D 25 Hydroxy (Vit-D Deficiency, Fractures)  3. Hypertriglyceridemia Cardiovascular risk and specific lipid/triglyceride goals reviewed. We discussed several  lifestyle modifications today. We will check labs today. [name] [name] will continue to work on diet, exercise and weight loss efforts. Orders and follow up as documented in patient record.   - Lipid Panel With LDL/HDL Ratio  4. Obesity with current BMI 30.9 [name] [name] is currently in the action stage of change. As such, his goal is to continue with weight loss efforts. He has agreed to the Category 3 Plan or keeping a food journal and adhering to recommended goals of 1500-1700 calories and 100 grams of protein daily.   Exercise goals: As is.  Behavioral modification strategies: increasing lean protein intake and increasing vegetables.  [name] [name] has agreed to follow-up with our clinic in 8 weeks. He was informed of the importance of frequent follow-up visits to maximize his success with intensive lifestyle modifications for his multiple health conditions.   [name] [name] was informed we would discuss his lab results at his next visit unless there is a critical issue that needs to be addressed sooner. [name] [name] agreed to keep his next visit at the agreed upon time to discuss these results.  Objective:   Blood pressure 120/72, pulse (!) 56, temperature 97.6 F (36.4 C), height _0  (1.753 m), weight 209 lb (94.8 kg), SpO2 99 %. Body mass index is 30.86 kg/m.  General: Cooperative, alert, well developed, in no acute distress. HEENT: Conjunctivae and lids unremarkable. Cardiovascular: Regular rhythm.  Lungs: Normal work of breathing. Neurologic: No focal deficits.   Lab Results  Component Value Date   CREATININE 0.98 06/22/2021  BUN 22 06/22/2021   NA 140 06/22/2021   K 4.6 06/22/2021   CL 103 06/22/2021   CO2 25 06/22/2021   Lab Results  Component Value Date   ALT 17 06/22/2021   AST 20 06/22/2021   ALKPHOS 62 06/22/2021   BILITOT 0.7 06/22/2021   Lab Results  Component Value Date   HGBA1C 5.6 06/22/2021   HGBA1C 5.7 (H) 12/03/2020   HGBA1C 5.5 07/23/2020   HGBA1C 5.6 04/21/2020   HGBA1C 5.7 (H)  12/31/2019   Lab Results  Component Value Date   INSULIN 6.2 06/22/2021   INSULIN 5.1 12/03/2020   INSULIN 5.2 07/23/2020   INSULIN 5.1 04/21/2020   INSULIN 10.5 12/31/2019   Lab Results  Component Value Date   TSH 1.51 09/11/2020   Lab Results  Component Value Date   CHOL 147 06/22/2021   HDL 57 06/22/2021   LDLCALC 77 06/22/2021   TRIG 62 06/22/2021   CHOLHDL 3 10/11/2019   Lab Results  Component Value Date   VD25OH 66.9 06/22/2021   VD25OH 73.1 12/03/2020   VD25OH 29.6 (L) 07/23/2020   Lab Results  Component Value Date   WBC 6.4 10/10/2020   HGB 14.4 10/10/2020   HCT 42.6 10/10/2020   MCV 96.8 10/10/2020   PLT 172 10/10/2020   No results found for: IRON, TIBC, FERRITIN  Obesity Behavioral Intervention:   Approximately 15 minutes were spent on the discussion below.  ASK: We discussed the diagnosis of obesity with [name] [name] today and [name] [name] agreed to give [name] permission to discuss obesity behavioral modification therapy today.  ASSESS: [name] [name] has the diagnosis of obesity and his BMI today is 30.85. [name] [name] is in the action stage of change.   ADVISE: [name] [name] was educated on the multiple health risks of obesity as well as the benefit of weight loss to improve his health. He was advised of the need for long term treatment and the importance of lifestyle modifications to improve his current health and to decrease his risk of future health problems.  AGREE: Multiple dietary modification options and treatment options were discussed and [name] [name] agreed to follow the recommendations documented in the above note.  ARRANGE: [name] [name] was educated on the importance of frequent visits to treat obesity as outlined per CMS and USPSTF guidelines and agreed to schedule his next follow up appointment today.  Attestation Statements:   Reviewed by clinician on day of visit: allergies, medications, problem list, medical history, surgical history, family history, social history, and previous  encounter notes.   I, Trixie Dredge, am acting as transcriptionist for Dennard Nip, MD.  I have reviewed the above documentation for accuracy and completeness, and I agree with the above. -  Dennard Nip, MD

## 2021-07-10 ENCOUNTER — Other Ambulatory Visit: Payer: Self-pay | Admitting: Family Medicine

## 2021-07-10 ENCOUNTER — Other Ambulatory Visit: Payer: Self-pay

## 2021-07-10 ENCOUNTER — Encounter (HOSPITAL_COMMUNITY): Payer: Self-pay | Admitting: Vascular Surgery

## 2021-07-10 NOTE — Progress Notes (Signed)
Spoke with pt and his wife, [name] [name] for pre-op call. Pt has hx of A-fib and is on Eliquis. He states he was not told by [name] [name] office to stop Eliquis, but he states in the past with surgeries he has stopped it 3 days prior to surgery. Last dose was last night (07/09/21). [name] [name] is his cardiologist.  Pt is Pre-diabetic, last A1C was 5.7 on 06/22/21. He states he does not check his blood sugar at home.   Pt's surgery is scheduled as ambulatory so no Covid test is required prior to surgery. Pt denies any recent symptoms of Covid.  Chart sent to Anesthesia PA for review.

## 2021-07-10 NOTE — Progress Notes (Signed)
Anesthesia Chart Review:  Follows with cardiology for history of atrial fibrillation maintained on Eliquis, OSA on CPAP.  Last seen by Dr. Ellyn Hack on 10/10/2020 and at that time underwent preop cardiovascular exam for upcoming shoulder surgery.  Per note, "[name] [name] is completely asymptomatic from his A. fib.  Does not require any rate or rhythm control. He had a complete ischemic evaluation back in 2016 when he was diagnosed with A. fib and had an abnormal stress test.  As such, I would not repeat a stress test in the absence of symptoms.  He is easily able to achieve greater than 4 METS without any symptoms.  Shoulder surgery is relatively low risk from a cardiovascular standpoint. He is nondiabetic, has no active CAD or CHF symptoms.  Normal renal function." Pt had left reverse shoulder arthroplasty 08/65/78 without complication.  Patient will need day of surgery labs and evaluation.  EKG 10/09/20: Afib. Rate 79.  Cath 04/10/2015: POST-OPERATIVE DIAGNOSIS:   Angiographically essentially normal coronary arteries. Likely false positive nuclear stress test. This may potentially be related to significant ectopy. Mild moderately reduced EF by LV gram however this does not correlate with echocardiography findings the patient has significant ectopy during the LV gram which makes wall motion difficult to interpret. Normal LVEDP.   PLAN OF CARE: Standard post femoral cath care after Angio-Seal closure. Anticipate discharge after bedrest. He will need to follow-up with me as scheduled.   TTE 03/20/2015: - Left ventricle: The cavity size was normal. Wall thickness was    normal. Systolic function was normal. The estimated ejection    fraction was in the range of 60% to 65%. Doppler parameters are    consistent with abnormal left ventricular relaxation (grade 1    diastolic dysfunction).  - Aortic valve: There was trivial regurgitation.  - Aorta: Aortic root dimension: 41 mm (ED).  - Ascending aorta: The  ascending aorta was mildly dilated.  - Left atrium: The atrium was mildly dilated.    [name] [name] [facility] Short Stay Center/Anesthesiology Phone [phone] 07/10/2021 3:35 PM

## 2021-07-10 NOTE — Anesthesia Preprocedure Evaluation (Addendum)
Anesthesia Evaluation  Patient identified by MRN, date of birth, ID band Patient awake    Reviewed: Allergy & Precautions, NPO status , Patient's Chart, lab work & pertinent test results  Airway Mallampati: III  TM Distance: >3 FB Neck ROM: Full    Dental  (+) Teeth Intact, Dental Advisory Given, Caps   Pulmonary sleep apnea , former smoker,    breath sounds clear to auscultation       Cardiovascular + CAD  + dysrhythmias Atrial Fibrillation  Rhythm:Regular Rate:Normal     Neuro/Psych negative neurological ROS  negative psych ROS   GI/Hepatic Neg liver ROS, GERD  ,  Endo/Other  negative endocrine ROS  Renal/GU negative Renal ROS     Musculoskeletal  (+) Arthritis ,   Abdominal Normal abdominal exam  (+)   Peds  Hematology negative hematology ROS (+)   Anesthesia Other Findings   Reproductive/Obstetrics                          Anesthesia Physical Anesthesia Plan  ASA: 3  Anesthesia Plan: General   Post-op Pain Management:    Induction: Intravenous  PONV Risk Score and Plan: 3 and Ondansetron, Dexamethasone and Treatment may vary due to age or medical condition  Airway Management Planned: LMA  Additional Equipment: None  Intra-op Plan:   Post-operative Plan: Extubation in OR  Informed Consent: I have reviewed the patients History and Physical, chart, labs and discussed the procedure including the risks, benefits and alternatives for the proposed anesthesia with the patient or authorized representative who has indicated his/her understanding and acceptance.     Dental advisory given  Plan Discussed with: CRNA  Anesthesia Plan Comments: (PAT note by Karoline Caldwell, PA-C:  Follows with cardiology for history of atrial fibrillation maintained on Eliquis, OSA on CPAP.  Last seen by Dr. Ellyn Hack on 10/10/2020 and at that time underwent preop cardiovascular exam for upcoming shoulder  surgery.  Per note, "[name] is completely asymptomatic from his A. fib. Does not require any rate or rhythm control. He had a complete ischemic evaluation back in 2016 when he was diagnosed with A. fib and had an abnormal stress test. As such, I would not repeat a stress test in the absence of symptoms. He is easily able to achieve greater than 4 METS without any symptoms. Shoulder surgery is relatively low risk from a cardiovascular standpoint. He is nondiabetic, has no active CAD or CHF symptoms. Normal renal function." Pt had left reverse shoulder arthroplasty 60/63/01 without complication.  Patient will need day of surgery labs and evaluation.  EKG 10/09/20: Afib. Rate 79.  Cath 04/10/2015: POST-OPERATIVE DIAGNOSIS:  . Angiographically essentially normal coronary arteries. Likely false positive nuclear stress test. This may potentially be related to significant ectopy. . Mild moderately reduced EF by LV gram however this does not correlate with echocardiography findings the patient has significant ectopy during the LV gram which makes wall motion difficult to interpret. . Normal LVEDP.  PLAN OF CARE: . Standard post femoral cath care after Angio-Seal closure. . Anticipate discharge after bedrest. . He will need to follow-up with me as scheduled.  TTE 03/20/2015: - Left ventricle: The cavity size was normal. Wall thickness was  normal. Systolic function was normal. The estimated ejection  fraction was in the range of 60% to 65%. Doppler parameters are  consistent with abnormal left ventricular relaxation (grade 1  diastolic dysfunction).  - Aortic valve: There was trivial regurgitation.  -  Aorta: Aortic root dimension: 41 mm (ED).  - Ascending aorta: The ascending aorta was mildly dilated.  - Left atrium: The atrium was mildly dilated.  )     Anesthesia Quick Evaluation

## 2021-07-13 ENCOUNTER — Ambulatory Visit (HOSPITAL_COMMUNITY): Payer: Medicare Other | Admitting: Physician Assistant

## 2021-07-13 ENCOUNTER — Encounter (HOSPITAL_COMMUNITY): Payer: Self-pay | Admitting: Vascular Surgery

## 2021-07-13 ENCOUNTER — Ambulatory Visit (HOSPITAL_COMMUNITY)
Admission: RE | Admit: 2021-07-13 | Discharge: 2021-07-13 | Disposition: A | Discharge status: Home / Self Care | Payer: Medicare Other | Attending: Vascular Surgery | Admitting: Vascular Surgery

## 2021-07-13 ENCOUNTER — Other Ambulatory Visit: Payer: Self-pay

## 2021-07-13 ENCOUNTER — Encounter (HOSPITAL_COMMUNITY)
Admission: RE | Disposition: A | Discharge status: Home / Self Care | Payer: Self-pay | Source: Home / Self Care | Attending: Vascular Surgery

## 2021-07-13 DIAGNOSIS — I872 Venous insufficiency (chronic) (peripheral): Secondary | ICD-10-CM | POA: Insufficient documentation

## 2021-07-13 DIAGNOSIS — Z825 Family history of asthma and other chronic lower respiratory diseases: Secondary | ICD-10-CM | POA: Insufficient documentation

## 2021-07-13 DIAGNOSIS — E559 Vitamin D deficiency, unspecified: Secondary | ICD-10-CM | POA: Diagnosis not present

## 2021-07-13 DIAGNOSIS — Z87891 Personal history of nicotine dependence: Secondary | ICD-10-CM | POA: Diagnosis not present

## 2021-07-13 DIAGNOSIS — I4819 Other persistent atrial fibrillation: Secondary | ICD-10-CM | POA: Diagnosis not present

## 2021-07-13 DIAGNOSIS — Z808 Family history of malignant neoplasm of other organs or systems: Secondary | ICD-10-CM | POA: Diagnosis not present

## 2021-07-13 DIAGNOSIS — I83892 Varicose veins of left lower extremities with other complications: Secondary | ICD-10-CM | POA: Insufficient documentation

## 2021-07-13 DIAGNOSIS — Z806 Family history of leukemia: Secondary | ICD-10-CM | POA: Insufficient documentation

## 2021-07-13 DIAGNOSIS — Z7901 Long term (current) use of anticoagulants: Secondary | ICD-10-CM | POA: Diagnosis not present

## 2021-07-13 DIAGNOSIS — Z79899 Other long term (current) drug therapy: Secondary | ICD-10-CM | POA: Diagnosis not present

## 2021-07-13 DIAGNOSIS — I4891 Unspecified atrial fibrillation: Secondary | ICD-10-CM | POA: Diagnosis not present

## 2021-07-13 DIAGNOSIS — Z810 Family history of intellectual disabilities: Secondary | ICD-10-CM | POA: Insufficient documentation

## 2021-07-13 DIAGNOSIS — Z8249 Family history of ischemic heart disease and other diseases of the circulatory system: Secondary | ICD-10-CM | POA: Diagnosis not present

## 2021-07-13 DIAGNOSIS — R5383 Other fatigue: Secondary | ICD-10-CM | POA: Diagnosis not present

## 2021-07-13 HISTORY — PX: VEIN LIGATION AND STRIPPING: SHX2653

## 2021-07-13 LAB — POCT I-STAT, CHEM 8
BUN: 26 mg/dL — ABNORMAL HIGH (ref 8–23)
Calcium, Ion: 1.18 mmol/L (ref 1.15–1.40)
Chloride: 107 mmol/L (ref 98–111)
Creatinine, Ser: 1.1 mg/dL (ref 0.61–1.24)
Glucose, Bld: 90 mg/dL (ref 70–99)
HCT: 43 % (ref 39.0–52.0)
Hemoglobin: 14.6 g/dL (ref 13.0–17.0)
Potassium: 4.2 mmol/L (ref 3.5–5.1)
Sodium: 142 mmol/L (ref 135–145)
TCO2: 26 mmol/L (ref 22–32)

## 2021-07-13 LAB — GLUCOSE, CAPILLARY: Glucose-Capillary: 114 mg/dL — ABNORMAL HIGH (ref 70–99)

## 2021-07-13 SURGERY — LIGATION AND STRIPPING, VARICOSE VEIN
Anesthesia: General | Laterality: Left

## 2021-07-13 MED ORDER — ARTIFICIAL TEARS OPHTHALMIC OINT
TOPICAL_OINTMENT | OPHTHALMIC | Status: AC
  Filled 2021-07-13: qty 3.5

## 2021-07-13 MED ORDER — DEXAMETHASONE SODIUM PHOSPHATE 10 MG/ML IJ SOLN
INTRAMUSCULAR | Status: DC | PRN
  Administered 2021-07-13: 5 mg via INTRAVENOUS

## 2021-07-13 MED ORDER — DEXAMETHASONE SODIUM PHOSPHATE 10 MG/ML IJ SOLN
INTRAMUSCULAR | Status: AC
  Filled 2021-07-13: qty 1

## 2021-07-13 MED ORDER — ROCURONIUM BROMIDE 10 MG/ML (PF) SYRINGE
PREFILLED_SYRINGE | INTRAVENOUS | Status: AC
  Filled 2021-07-13: qty 10

## 2021-07-13 MED ORDER — PHENYLEPHRINE 40 MCG/ML (10ML) SYRINGE FOR IV PUSH (FOR BLOOD PRESSURE SUPPORT)
PREFILLED_SYRINGE | INTRAVENOUS | Status: AC
  Filled 2021-07-13: qty 10

## 2021-07-13 MED ORDER — CHLORHEXIDINE GLUCONATE 4 % EX LIQD: 60.0000 mL | Freq: Once | CUTANEOUS | Status: DC

## 2021-07-13 MED ORDER — EPHEDRINE SULFATE-NACL 50-0.9 MG/10ML-% IV SOSY
PREFILLED_SYRINGE | INTRAVENOUS | Status: DC | PRN
  Administered 2021-07-13 (×2): 5 mg via INTRAVENOUS

## 2021-07-13 MED ORDER — OXYCODONE HCL 5 MG PO TABS: 5.0000 mg | ORAL_TABLET | ORAL | 0 refills | Status: DC | PRN

## 2021-07-13 MED ORDER — LIDOCAINE 2% (20 MG/ML) 5 ML SYRINGE
INTRAMUSCULAR | Status: DC | PRN
  Administered 2021-07-13: 40 mg via INTRAVENOUS

## 2021-07-13 MED ORDER — FENTANYL CITRATE (PF) 100 MCG/2ML IJ SOLN
INTRAMUSCULAR | Status: DC | PRN
  Administered 2021-07-13 (×3): 50 ug via INTRAVENOUS

## 2021-07-13 MED ORDER — PROPOFOL 10 MG/ML IV BOLUS
INTRAVENOUS | Status: AC
  Filled 2021-07-13: qty 40

## 2021-07-13 MED ORDER — PHENYLEPHRINE HCL-NACL 10-0.9 MG/250ML-% IV SOLN
INTRAVENOUS | Status: DC | PRN
  Administered 2021-07-13: 20 ug/min via INTRAVENOUS

## 2021-07-13 MED ORDER — HEPARIN SODIUM (PORCINE) 1000 UNIT/ML IJ SOLN
INTRAMUSCULAR | Status: AC
  Filled 2021-07-13: qty 1

## 2021-07-13 MED ORDER — ONDANSETRON HCL 4 MG/2ML IJ SOLN
INTRAMUSCULAR | Status: AC
  Filled 2021-07-13: qty 2

## 2021-07-13 MED ORDER — CEFAZOLIN SODIUM-DEXTROSE 2-4 GM/100ML-% IV SOLN
2.0000 g | INTRAVENOUS | Status: AC
  Administered 2021-07-13: 2 g via INTRAVENOUS

## 2021-07-13 MED ORDER — SODIUM CHLORIDE 0.9 % IV SOLN: INTRAVENOUS | Status: DC

## 2021-07-13 MED ORDER — FENTANYL CITRATE (PF) 250 MCG/5ML IJ SOLN
INTRAMUSCULAR | Status: AC
  Filled 2021-07-13: qty 5

## 2021-07-13 MED ORDER — DEXMEDETOMIDINE (PRECEDEX) IN NS 20 MCG/5ML (4 MCG/ML) IV SYRINGE
PREFILLED_SYRINGE | INTRAVENOUS | Status: DC | PRN
  Administered 2021-07-13: 8 ug via INTRAVENOUS

## 2021-07-13 MED ORDER — 0.9 % SODIUM CHLORIDE (POUR BTL) OPTIME
TOPICAL | Status: DC | PRN
  Administered 2021-07-13: 1000 mL

## 2021-07-13 MED ORDER — CEFAZOLIN SODIUM-DEXTROSE 2-4 GM/100ML-% IV SOLN
INTRAVENOUS | Status: AC
  Filled 2021-07-13: qty 100

## 2021-07-13 MED ORDER — PROPOFOL 10 MG/ML IV BOLUS
INTRAVENOUS | Status: DC | PRN
  Administered 2021-07-13: 200 mg via INTRAVENOUS

## 2021-07-13 MED ORDER — LACTATED RINGERS IV SOLN: INTRAVENOUS | Status: DC | PRN

## 2021-07-13 MED ORDER — DEXMEDETOMIDINE (PRECEDEX) IN NS 20 MCG/5ML (4 MCG/ML) IV SYRINGE
PREFILLED_SYRINGE | INTRAVENOUS | Status: AC
  Filled 2021-07-13: qty 5

## 2021-07-13 MED ORDER — PHENYLEPHRINE HCL-NACL 10-0.9 MG/250ML-% IV SOLN
INTRAVENOUS | Status: AC
  Filled 2021-07-13: qty 250

## 2021-07-13 MED ORDER — LIDOCAINE 2% (20 MG/ML) 5 ML SYRINGE
INTRAMUSCULAR | Status: AC
  Filled 2021-07-13: qty 5

## 2021-07-13 MED ORDER — CHLORHEXIDINE GLUCONATE 0.12 % MT SOLN
OROMUCOSAL | Status: AC
  Administered 2021-07-13: 15 mL
  Filled 2021-07-13: qty 15

## 2021-07-13 MED ORDER — PROPOFOL 1000 MG/100ML IV EMUL
INTRAVENOUS | Status: AC
  Filled 2021-07-13: qty 100

## 2021-07-13 SURGICAL SUPPLY — 59 items
BAG COUNTER SPONGE SURGICOUNT (BAG) ×2 IMPLANT
BAG ISOLATION DRAPE 18X18 (DRAPES) ×1 IMPLANT
BLADE SURG 11 STRL SS (BLADE) ×2 IMPLANT
BNDG COHESIVE 6X5 TAN STRL LF (GAUZE/BANDAGES/DRESSINGS) IMPLANT
BNDG ELASTIC 4X5.8 VLCR STR LF (GAUZE/BANDAGES/DRESSINGS) ×4 IMPLANT
BNDG ELASTIC 6X15 VLCR STRL LF (GAUZE/BANDAGES/DRESSINGS) ×2 IMPLANT
BNDG ELASTIC 6X5.8 VLCR STR LF (GAUZE/BANDAGES/DRESSINGS) ×2 IMPLANT
BNDG GAUZE ELAST 4 BULKY (GAUZE/BANDAGES/DRESSINGS) ×4 IMPLANT
CANISTER SUCT 3000ML PPV (MISCELLANEOUS) ×2 IMPLANT
CLIP VESOCCLUDE MED 6/CT (CLIP) ×2 IMPLANT
CLIP VESOCCLUDE SM WIDE 24/CT (CLIP) ×2 IMPLANT
CLSR STERI-STRIP ANTIMIC 1/2X4 (GAUZE/BANDAGES/DRESSINGS) ×2 IMPLANT
COVER SURGICAL LIGHT HANDLE (MISCELLANEOUS) ×2 IMPLANT
DERMABOND ADVANCED (GAUZE/BANDAGES/DRESSINGS)
DERMABOND ADVANCED .7 DNX12 (GAUZE/BANDAGES/DRESSINGS) IMPLANT
DRAPE HALF SHEET 40X57 (DRAPES) ×2 IMPLANT
DRAPE INCISE IOBAN 66X45 STRL (DRAPES) ×2 IMPLANT
DRAPE ISOLATION BAG 18X18 (DRAPES) ×1
DRSG PAD ABDOMINAL 8X10 ST (GAUZE/BANDAGES/DRESSINGS) ×6 IMPLANT
ELECT REM PT RETURN 9FT ADLT (ELECTROSURGICAL) ×2
ELECTRODE REM PT RTRN 9FT ADLT (ELECTROSURGICAL) ×1 IMPLANT
GAUZE 4X4 16PLY ~~LOC~~+RFID DBL (SPONGE) ×6 IMPLANT
GAUZE SPONGE 4X4 12PLY STRL (GAUZE/BANDAGES/DRESSINGS) ×4 IMPLANT
GAUZE SPONGE 4X4 12PLY STRL LF (GAUZE/BANDAGES/DRESSINGS) ×2 IMPLANT
GLOVE SRG 8 PF TXTR STRL LF DI (GLOVE) ×1 IMPLANT
GLOVE SURG ENC MOIS LTX SZ7.5 (GLOVE) ×4 IMPLANT
GLOVE SURG MICRO LTX SZ7 (GLOVE) ×2 IMPLANT
GLOVE SURG UNDER POLY LF SZ6.5 (GLOVE) ×2 IMPLANT
GLOVE SURG UNDER POLY LF SZ7 (GLOVE) ×2 IMPLANT
GLOVE SURG UNDER POLY LF SZ7.5 (GLOVE) ×2 IMPLANT
GLOVE SURG UNDER POLY LF SZ8 (GLOVE) ×1
GOWN STRL REUS W/ TWL LRG LVL3 (GOWN DISPOSABLE) ×3 IMPLANT
GOWN STRL REUS W/TWL LRG LVL3 (GOWN DISPOSABLE) ×3
KIT BASIN OR (CUSTOM PROCEDURE TRAY) ×2 IMPLANT
KIT TURNOVER KIT B (KITS) ×2 IMPLANT
LOOP VESSEL MAXI BLUE (MISCELLANEOUS) ×2 IMPLANT
NS IRRIG 1000ML POUR BTL (IV SOLUTION) ×2 IMPLANT
PACK GENERAL/GYN (CUSTOM PROCEDURE TRAY) ×2 IMPLANT
PACK UNIVERSAL I (CUSTOM PROCEDURE TRAY) ×2 IMPLANT
PAD ARMBOARD 7.5X6 YLW CONV (MISCELLANEOUS) ×4 IMPLANT
SPONGE T-LAP 18X18 ~~LOC~~+RFID (SPONGE) ×4 IMPLANT
STAPLER VISISTAT (STAPLE) IMPLANT
STRIP CLOSURE SKIN 1/2X4 (GAUZE/BANDAGES/DRESSINGS) ×4 IMPLANT
SUT MNCRL AB 4-0 PS2 18 (SUTURE) ×4 IMPLANT
SUT PROLENE 2 0 SH DA (SUTURE) ×2 IMPLANT
SUT PROLENE 5 0 C 1 24 (SUTURE) ×2 IMPLANT
SUT SILK 2 0 (SUTURE) ×2
SUT SILK 2 0 SH (SUTURE) ×2 IMPLANT
SUT SILK 2-0 18XBRD TIE 12 (SUTURE) ×2 IMPLANT
SUT SILK 3 0 (SUTURE) ×1
SUT SILK 3-0 18XBRD TIE 12 (SUTURE) ×1 IMPLANT
SUT SILK 4 0 (SUTURE) ×1
SUT SILK 4-0 18XBRD TIE 12 (SUTURE) ×1 IMPLANT
SUT VIC AB 3-0 SH 27 (SUTURE) ×2
SUT VIC AB 3-0 SH 27X BRD (SUTURE) ×2 IMPLANT
TOWEL GREEN STERILE (TOWEL DISPOSABLE) ×2 IMPLANT
TOWEL GREEN STERILE FF (TOWEL DISPOSABLE) ×2 IMPLANT
UNDERPAD 30X36 HEAVY ABSORB (UNDERPADS AND DIAPERS) ×2 IMPLANT
WATER STERILE IRR 1000ML POUR (IV SOLUTION) ×2 IMPLANT

## 2021-07-13 NOTE — Op Note (Signed)
    NAME: [name] [name]    MRN: [id number] DOB: Feb 19, 1941    DATE OF OPERATION: 07/13/2021  PREOP DIAGNOSIS:    Bleeding varicose veins left lower extremity  POSTOP DIAGNOSIS:    Same  PROCEDURE:   Ligation and stripping of left great saphenous vein Greater than 20 stab phlebectomies  SURGEON: Judeth Cornfield. Scot Dock, MD  ASSIST: Arlee Muslim, PA  ANESTHESIA: General  EBL: 100 cc  INDICATIONS:    [name] is a 80 y.o. male with advanced chronic venous insufficiency.  He had several bleeding episodes from the left leg.  He was not a candidate for laser ablation left great saphenous vein as the vein was very superficial.  He presents for ligation and stripping of the left great saphenous vein with greater than 20 stab phlebectomies  FINDINGS:   Markedly dilated great saphenous vein and varicose veins  TECHNIQUE:   The patient was taken to the operating room after the veins were marked with the patient standing.  He received a general anesthetic.  The left leg was prepped and draped in usual sterile fashion.  A small oblique incision was made in the left groin and through this incision the saphenofemoral junction was dissected free.  Multiple branches were divided between 2-0 silk ties.  In the distal thigh separate longitudinal incision was made over the vein with the vein was dissected free.  I initially performed attempted inversion saphenectomy but the vein disconnected in the mid thigh.  I made a small incision here and then using the stripper stripped the vein in the distal thigh and then separately in the proximal thigh and pressure was held for hemostasis.  Next using approximately 25 small stab incisions the large dilated varicose veins were hooked brought above the skin and then clamped and then removed with hemostats and pressure held for hemostasis.  At the completion Steri-Strips were applied to the small stab incisions.  The groin incision was closed with 2  deep layers of 3-0 Vicryl and the skin closed with 4-0 Monocryl.  The other 2 additional small incisions were closed with a deep layer of 3-0 Vicryl and the skin closed with 4-0 Monocryl.  At the completion of the procedure a pressure dressing was applied.  The patient tolerated the procedure well and was transferred to the recovery room in stable condition.  Given the complexity of the case a first assistant was necessary in order to expedient the procedure and safely perform the technical aspects of the operation.  Deitra Mayo, MD, FACS [facility]  DATE OF DICTATION:   07/13/2021

## 2021-07-13 NOTE — Transfer of Care (Signed)
Immediate Anesthesia Transfer of Care Note  Patient: [name] [name]  Procedure(s) Performed: LIGATION AND STRIPPING OF LEFT GREAT SAPHENOUS VEIN WITH STAB PHLEBECTOMY GREATER THAN 20 INCISIONS TO LEFT LEG (Left)  Patient Location: PACU  Anesthesia Type:General  Level of Consciousness: sedated, patient cooperative and responds to stimulation  Airway & Oxygen Therapy: Patient Spontanous Breathing and Patient connected to nasal cannula oxygen  Post-op Assessment: Report given to RN, Post -op Vital signs reviewed and stable and Patient moving all extremities  Post vital signs: Reviewed and stable  Last Vitals:  Vitals Value Taken Time  BP    Temp    Pulse 111 07/13/21 1003  Resp 11 07/13/21 1003  SpO2 97 % 07/13/21 1003  Vitals shown include unvalidated device data.  Last Pain:  Vitals:   07/13/21 0625  TempSrc:   PainSc: 0-No pain         Complications: No notable events documented.

## 2021-07-13 NOTE — H&P (Signed)
REASON FOR VISIT:    Ligation and stripping of L GSV, and > 20 stabs.    MEDICAL ISSUES:    PAINFUL VARICOSE VEINS OF THE LEFT LOWER EXTREMITY: This patient has CEAP C4 venous disease.  He has markedly dilated varicose veins under significant pressure and had significant bleeding from a telangiectasia in his left leg.  He has been doing conservative measures including thigh-high compression stockings, elevation, and exercise.  Given the risk for recurrent bleeding episodes I think he would benefit from ligation and stripping of the left great saphenous vein with greater than 20 stab phlebectomies.  We could also consider laser ablation of the left great saphenous vein but the vein was less than a centimeter from the skin and the mid to distal thigh which would put him at higher risk for pigmentation, scarring, and skin inflammation.  He is agreeable to ligation and stripping which I think is the best option here.  We have discussed the indications for the procedure and the potential complications of surgery including not limited to bleeding and bruising.  We will also require 1 unit of sclerotherapy for the area that had bled.  My main goal with ligation and stripping and stab phlebectomies is to lower the venous pressure on the left to lower his risk of future bleeding episodes.     HPI:    [name] [name] is a pleasant 80 y.o. male who was seen by Liana Crocker, PA on 02/09/2021 with painful varicose veins of the left leg.  He did reportedly have 1 bleeding episode related to his varicose veins.  He has no previous history of DVT.  Of note he is on Eliquis.  He has large dilated varicose veins of the left leg as documented in the photographs on 02/09/2021.  He was prescribed thigh-high compression stockings with a gradient of 20 to 30 mmHg, encouraged to elevate his legs and exercise.  He comes in for 40-month follow-up visit.   On my history the patient has not had significant symptoms  related to his varicose veins.  His main concern was a significant bleeding episode in which there was significant blood loss.  This was from a telangiectasia on the anterior left leg.  He has had no further bleeding episodes.   He has been wearing his thigh-high compression stockings.  He is elevating his legs some.  He does exercise.  He has not required ibuprofen.         Past Medical History:  Diagnosis Date   Atrial fibrillation (HCC)     BPH (benign prostatic hypertrophy)     Bradycardia 12/30/2014    HR routinely in mid 40s-50s   Coronary artery disease (CAD) excluded April 2016    False-positive nuclear stress test suggesting inferior ischemia   Dysrhythmia     Elevated PSA     Encounter for Medicare annual wellness exam 09/22/2013    Sees Dr Wilhemina Bonito of Derm Sees Dr Silvano Rusk of Gastroenterology Sees Dr Kathie Rhodes of [facility] Sees Dr Susa Day of Optometry   GERD (gastroesophageal reflux disease)      controlled w/ diet and behavioral changes, history of   Grade I diastolic dysfunction 0017   History of kidney stones     Hypertriglyceridemia 03/16/2015   Lymphadenitis     Melanoma of back ([place])      Excised Dr Wilhemina Bonito   Mild ascending aorta dilatation [facility])     New onset  a-fib ([place]) 03/10/2015   OA (osteoarthritis)     Otitis, externa, infective 08/10/2015   Palpitations     Post herpetic neuralgia     Shingles 07/09/2013   Sleep apnea 07/30/2011    cpap- 13   Snoring disorder 07/30/2011           Family History  Problem Relation Age of Onset   Cancer Mother 73        MM, leukemia   Obesity Mother     Emphysema Father 58   Obesity Father     Alcohol abuse Father     Cancer Sister 36        brain   COPD Brother 71   Heart disease Paternal Grandmother     COPD Paternal Grandfather     Diabetes Sister 60   Obesity Sister     Down syndrome Brother 88        aspirated   Alcohol abuse Other     Colon cancer Neg Hx        SOCIAL  HISTORY: Social History         Tobacco Use   Smoking status: Former Smoker      Years: 27.00      Types: Pipe, Cigars      Quit date: 02/11/1989      Years since quitting: 32.2   Smokeless tobacco: Never Used  Substance Use Topics   Alcohol use: Yes      Alcohol/week: 7.0 standard drinks      Types: 7 Glasses of wine per week      Comment: 1 per day -- brandy or wine      No Known Allergies         Current Outpatient Medications  Medication Sig Dispense Refill   Cholecalciferol (VITAMIN D3) 125 MCG (5000 UT) CAPS Take 1 capsule (5,000 Units total) by mouth daily. 30 capsule 0   ELIQUIS 5 MG TABS tablet Take 1 tablet by mouth twice daily 180 tablet 1   gabapentin (NEURONTIN) 300 MG capsule TAKE 3 CAPSULES BY MOUTH THREE TIMES DAILY 270 capsule 0   Multiple Vitamin (MULTIVITAMIN WITH MINERALS) TABS tablet Take 1 tablet by mouth daily.        No current facility-administered medications for this visit.      REVIEW OF SYSTEMS:  [X]  denotes positive finding, [ ]  denotes negative finding Cardiac   Comments:  Chest pain or chest pressure:      Shortness of breath upon exertion:      Short of breath when lying flat:      Irregular heart rhythm:             Vascular      Pain in calf, thigh, or hip brought on by ambulation:      Pain in feet at night that wakes you up from your sleep:      Blood clot in your veins:      Leg swelling:             Pulmonary      Oxygen at home:      Productive cough:      Wheezing:             Neurologic      Sudden weakness in arms or legs:      Sudden numbness in arms or legs:      Sudden onset of difficulty speaking or slurred speech:      Temporary loss  of vision in one eye:      Problems with dizziness:             Gastrointestinal      Blood in stool:      Vomited blood:             Genitourinary      Burning when urinating:      Blood in urine:             Psychiatric      Major depression:             Hematologic       Bleeding problems:      Problems with blood clotting too easily:             Skin      Rashes or ulcers:             Constitutional      Fever or chills:        PHYSICAL EXAM:       Vitals:    05/20/21 0859  BP: 107/72  Pulse: 86  Resp: 20  Temp: 98 F (36.7 C)  SpO2: 99%  Weight: 221 lb (100.2 kg)  Height: 5\' 9"  (1.753 m)      GENERAL: The patient is a well-nourished male, in no acute distress. The vital signs are documented above. CARDIAC: There is a regular rate and rhythm. VASCULAR: I do not detect carotid bruits. He has palpable dorsalis pedis pulses bilaterally. He has large dilated varicose veins along the medial aspect of his left calf and also his posterior left calf as documented in the photographs below.         PULMONARY: There is good air exchange bilaterally without wheezing or rales. ABDOMEN: Soft and non-tender with normal pitched bowel sounds. MUSCULOSKELETAL: There are no major deformities or cyanosis. NEUROLOGIC: No focal weakness or paresthesias are detected. SKIN: There are no ulcers or rashes noted. PSYCHIATRIC: The patient has a normal affect.   DATA:     VENOUS DUPLEX: I reviewed his previous duplex scan of the left lower extremity.  This showed no evidence of DVT on the left.  He did have significant deep venous reflux involving the common femoral vein femoral vein and popliteal vein.  He also had significant superficial venous reflux involving the great saphenous vein from the saphenofemoral junction to the proximal calf.  Diameters range from 9 to 16 mm.  He also had reflux in the small saphenous vein with diameters ranging from 5 to 5.5 mm.   A total of 40 minutes was spent on this visit. 20 minutes was face to face time. More than 50% of the time was spent on counseling and coordinating with the patient.     Deitra Mayo [facility]

## 2021-07-13 NOTE — Anesthesia Procedure Notes (Signed)
Procedure Name: LMA Insertion Date/Time: 07/13/2021 7:33 AM Performed by: Myna Bright, CRNA Pre-anesthesia Checklist: Patient identified, Emergency Drugs available, Suction available and Patient being monitored Patient Re-evaluated:Patient Re-evaluated prior to induction Oxygen Delivery Method: Circle system utilized Preoxygenation: Pre-oxygenation with 100% oxygen Induction Type: IV induction Ventilation: Mask ventilation without difficulty LMA: LMA inserted LMA Size: 4.0 Number of attempts: 1

## 2021-07-13 NOTE — Anesthesia Postprocedure Evaluation (Signed)
Anesthesia Post Note  Patient: Arty Lantzy  Procedure(s) Performed: LIGATION AND STRIPPING OF LEFT GREAT SAPHENOUS VEIN WITH STAB PHLEBECTOMY GREATER THAN 20 INCISIONS TO LEFT LEG (Left)     Patient location during evaluation: PACU Anesthesia Type: General Level of consciousness: awake and alert Pain management: pain level controlled Vital Signs Assessment: post-procedure vital signs reviewed and stable Respiratory status: spontaneous breathing, nonlabored ventilation, respiratory function stable and patient connected to nasal cannula oxygen Cardiovascular status: blood pressure returned to baseline and stable Postop Assessment: no apparent nausea or vomiting Anesthetic complications: no   No notable events documented.  Last Vitals:  Vitals:   07/13/21 1035 07/13/21 1050  BP: (!) 126/96 114/74  Pulse: 87 90  Resp: 16 15  Temp:  (!) 36.2 C  SpO2: 97% 95%    Last Pain:  Vitals:   07/13/21 1050  TempSrc:   PainSc: 0-No pain                 Effie Berkshire

## 2021-07-14 ENCOUNTER — Encounter (HOSPITAL_COMMUNITY): Payer: Self-pay | Admitting: Vascular Surgery

## 2021-07-29 ENCOUNTER — Other Ambulatory Visit: Payer: Self-pay

## 2021-07-29 ENCOUNTER — Ambulatory Visit (INDEPENDENT_AMBULATORY_CARE_PROVIDER_SITE_OTHER): Payer: Medicare Other | Admitting: Physician Assistant

## 2021-07-29 VITALS — BP 127/81 | HR 80 | Temp 97.8°F | Resp 20 | Ht 70.0 in | Wt 218.6 lb

## 2021-07-29 DIAGNOSIS — I872 Venous insufficiency (chronic) (peripheral): Secondary | ICD-10-CM

## 2021-07-29 NOTE — Progress Notes (Signed)
    Postoperative Visit    History of Present Illness   [name] [name] is a 80 y.o. male who presents for postoperative follow-up for ligation and stripping of left greater saphenous vein with greater than 20 stab phlebectomies by Dr. Scot Dock on 07/13/2021.  Patient states he has had pain along the medial thigh which has been firm and tender to touch.  He states the incisions of his groin and lower leg are all healed.  He denies any fevers, chills, nausea/vomiting.  He is on Eliquis for atrial fibrillation.  For VQI Use Only   PRE-ADM LIVING: Home  AMB STATUS: Ambulatory   Physical Examination   Vitals:   07/29/21 1113  BP: 127/81  Pulse: 80  Resp: 20  Temp: 97.8 F (36.6 C)  SpO2: 98%    LLE: Groin, thigh, and lower leg incisions all healed; fluid collection along the saphenous vein sheath in the medial thigh without any overlying cellulitis   Medical Decision Making   [name] [name] is a 80 y.o. male who presents s/p ligation and stripping of left greater saphenous vein with greater than 20 stab phlebectomies   -Groin incision and all other incisions of the lower extremity have healed well -He has a fluid collection along the greater saphenous vein sheath which is understandable given the markedly dilated greater saphenous vein -Recommendations included knee-high compression with Ace wrap to the thigh area.  He can also take a short course of NSAIDs in addition to using a warm compress on and off to help speed reabsorption -I also encouraged the patient to walk is much as possible -Patient will follow up in 2 to 3 weeks to make sure this area is improving; he knows to call/return to office sooner if area worsens or if he develops any redness, fevers, chills, etc  Dagoberto Ligas PA-C [facility] [place] Office: [phone]  Clinic MD: Scot Dock

## 2021-07-30 ENCOUNTER — Telehealth: Payer: Self-pay | Admitting: Medical

## 2021-07-30 ENCOUNTER — Telehealth: Payer: Self-pay | Admitting: *Deleted

## 2021-07-30 ENCOUNTER — Ambulatory Visit (INDEPENDENT_AMBULATORY_CARE_PROVIDER_SITE_OTHER): Payer: Medicare Other | Admitting: Medical

## 2021-07-30 VITALS — BP 109/67 | HR 91 | Temp 98.6°F | Resp 17 | Ht 70.0 in | Wt 221.4 lb

## 2021-07-30 DIAGNOSIS — B0229 Other postherpetic nervous system involvement: Secondary | ICD-10-CM | POA: Diagnosis not present

## 2021-07-30 DIAGNOSIS — R739 Hyperglycemia, unspecified: Secondary | ICD-10-CM | POA: Diagnosis not present

## 2021-07-30 DIAGNOSIS — Z9989 Dependence on other enabling machines and devices: Secondary | ICD-10-CM

## 2021-07-30 DIAGNOSIS — G4733 Obstructive sleep apnea (adult) (pediatric): Secondary | ICD-10-CM

## 2021-07-30 MED ORDER — GABAPENTIN 300 MG PO CAPS: 900.0000 mg | ORAL_CAPSULE | Freq: Three times a day (TID) | ORAL | 3 refills | Status: DC

## 2021-07-30 NOTE — Telephone Encounter (Signed)
Dr. Charlett Blake,  I refilled pt gabapentin today. Epic brought up dosing warming. Indicated 900 mg was max dose for him by his kidney function. June 2022 cmp in epic. Just wanted to make you aware. Sounds like he does not have much options. Failed lyrica and per pt you tried to titrate down on gabapentin but pain returned.  Thanks, Percell Miller

## 2021-07-30 NOTE — Telephone Encounter (Signed)
Patient was in office seeing Percell Miller today for other reason but mentioned to assistant that he needed a letter to be sent to company.  He has had the machine for years.  He stated the the warning that comes up is "warning, out lived its life." Advised patient that he will be probably referred to pulmonary.  He had a sleep study long time ago.  Referral to pulmonary has been pended.

## 2021-07-30 NOTE — Progress Notes (Signed)
Subjective:    Patient ID: [name] [name], male    DOB: [birth year], 80 y.o.   MRN: [id number]  HPI  Pt needs gabapentin renewed.  Pt has history of some post herpetic neuropathy. Pt had this event 6-7 years ago. Pt is on gabapentin 300 mg  tabs po tid. Needs refill.  Pt also note just had varicose vein surgery on 07-13-2021.  Pt had 4 covid vaccines.   No other complaints reported.    Review of Systems  Constitutional:  Negative for chills, fatigue and fever.  HENT:  Negative for congestion and dental problem.   Respiratory:  Negative for cough, chest tightness, shortness of breath and wheezing.   Cardiovascular:  Negative for chest pain and palpitations.  Gastrointestinal:  Negative for abdominal pain.  Genitourinary:  Negative for difficulty urinating and dysuria.  Musculoskeletal:  Negative for back pain.  Skin:  Negative for rash.    Past Medical History:  Diagnosis Date   Atrial fibrillation (HCC)    BPH (benign prostatic hypertrophy)    Bradycardia 12/30/2014   HR routinely in mid 40s-50s   Coronary artery disease (CAD) excluded 03/2015   False-positive nuclear stress test suggesting inferior ischemia   Dysrhythmia    Elevated PSA    Encounter for Medicare annual wellness exam 09/22/2013   Sees Dr Wilhemina Bonito of Derm Sees Dr Silvano Rusk of Gastroenterology Sees Dr Kathie Rhodes of [facility] Sees Dr Susa Day of Optometry   GERD (gastroesophageal reflux disease)    controlled w/ diet and behavioral changes, history of   Grade I diastolic dysfunction [id number]   History of kidney stones    Hypertriglyceridemia 03/16/2015   Lymphadenitis    Melanoma of back ([place])    Excised Dr Wilhemina Bonito   Mild ascending aorta dilatation ([place])    New onset a-fib ([place]) 03/10/2015   OA (osteoarthritis)    Otitis, externa, infective 08/10/2015   Palpitations    Pneumonia 2004   Post herpetic neuralgia    Shingles 07/09/2013   Sleep apnea 07/30/2011   cpap- 13     Snoring disorder 07/30/2011     Social History   Socioeconomic History   Marital status: Married    Spouse name: [name]   Number of children: 2   Years of education: Not on file   Highest education level: Not on file  Occupational History   Occupation: retired    Comment: truck driver  Tobacco Use   Smoking status: Former    Types: Pipe, Cigars    Quit date: 02/11/1989    Years since quitting: 32.4   Smokeless tobacco: Never  Vaping Use   Vaping Use: Never used  Substance and Sexual Activity   Alcohol use: Yes    Alcohol/week: 7.0 standard drinks    Types: 7 Glasses of wine per week    Comment: 1 per day -- brandy or wine   Drug use: No   Sexual activity: Yes    Comment: lives with wife, retired from [employer] driving, no dietary restrictions  Other Topics Concern   Not on file  Social History Narrative   He is a married father of 2, and grandfather of 71. He has been married to his wife [name] [name] for 51 years. He previously lived in [place] but has moved with his wife to [place], [place] to be close to his daughter [name] [name] and her family. [name] [name] is our clinical pharmacist.   He previously worked as a Administrator and had  2 years of college after high school. He quit smoking in 1990. He has 6-7 glasses of brandy or wine a week.    He exercises regularly with low impact aerobics 2 days a week and daily walks of 30-45 minutes. His exercise sessions or 60 minutes. He will do some type of exercise at least 7 days a week and is very active doing yard work and wood work.      Patient is right-handed. He lives with his wife in a one level home with a basement. He drinks 4-5 cups of decaf coffee a day. He and his wife go to the gym 1-2 x a week.   Social Determinants of Health   Financial Resource Strain: Low Risk    Difficulty of Paying Living Expenses: Not hard at all  Food Insecurity: No Food Insecurity   Worried About Charity fundraiser in the Last Year: Never true   [place] in the Last Year: Never true  Transportation Needs: No Transportation Needs   Lack of Transportation (Medical): No   Lack of Transportation (Non-Medical): No  Physical Activity: Not on file  Stress: Not on file  Social Connections: Not on file  Intimate Partner Violence: Not on file    Past Surgical History:  Procedure Laterality Date   CATARACT EXTRACTION Right    COLONOSCOPY  2018   CYSTOSCOPY/URETEROSCOPY/HOLMIUM LASER/STENT PLACEMENT Left 10/05/2019   Procedure: CYSTOSCOPY/RETROGRADE/URETEROSCOPY/HOLMIUM LASER/STENT PLACEMENT;  Surgeon: Kathie Rhodes, MD;  Location: [facility];  Service: Urology;  Laterality: Left;   EYE SURGERY Left 12/06/2017   cataract by Dr Logan Bores HERNIA REPAIR Bilateral 06/01/2019   Procedure: LAPAROSCOPIC LEFT AND  RIGHT INGUINAL HERNIA REPAIR WITH MESH;  Surgeon: Michael Boston, MD;  Location: [facility];  Service: General;  Laterality: Bilateral;   LEFT HEART CATHETERIZATION WITH CORONARY ANGIOGRAM N/A 04/10/2015   Procedure: LEFT HEART CATHETERIZATION WITH  CORONARY ANGIOGRAM;  Surgeon: Leonie Man, MD;  Location: [facility] CATH LAB;  Service: Cardiovascular;  Angiographicallly NORMAL CORONARY ARTERIES   MASS EXCISION N/A 05/16/2014   Procedure: EXCISION POSTERIOR NECK MASS, RIGHT CHEST WALL MASS AND RIGHT AXILLARY MASS AXILLARY NODE DISSECTION;  Surgeon: Adin Hector, MD;  Location: [facility];  Service: General;  Laterality: N/A;   NM MYOVIEW LTD  03/19/2015   FALSE POSITIVE:  INTERMEDIATE RISK. Small sized, moderate intensity inferior ischemic perfusion defect   REVERSE SHOULDER ARTHROPLASTY Left 10/16/2020   Procedure: REVERSE SHOULDER ARTHROPLASTY;  Surgeon: Justice Britain, MD;  Location: [facility];  Service: Orthopedics;  Laterality: Left;  130mn   TRANSTHORACIC ECHOCARDIOGRAM  03/20/2015   Normal LV size and function. EF 60-65%. G1 DD. Trivial AI. Borderline aortic root dilation (41 mm), Mild LA dilation.   VEIN  LIGATION AND STRIPPING Left 07/13/2021   Procedure: LIGATION AND STRIPPING OF LEFT GREAT SAPHENOUS VEIN WITH STAB PHLEBECTOMY GREATER THAN 20 INCISIONS TO LEFT LEG;  Surgeon: DAngelia Mould MD;  [facility];  Service: Vascular;  Laterality: Left;    Family History  Problem Relation Age of Onset   Cancer Mother 66      MM, leukemia   Obesity Mother    Emphysema Father 662  Obesity Father    Alcohol abuse Father    Cancer Sister 517      brain   COPD Brother 754  Heart disease Paternal Grandmother    COPD Paternal Grandfather    Diabetes Sister 776  Obesity Sister    Down syndrome Brother 28       aspirated   Alcohol abuse Other    Colon cancer Neg Hx     No Known Allergies  Current Outpatient Medications on File Prior to Visit  Medication Sig Dispense Refill   Cholecalciferol (VITAMIN D3) 125 MCG (5000 UT) CAPS Take 1 capsule (5,000 Units total) by mouth daily. (Patient taking differently: Take 5,000 Units by mouth daily.) 30 capsule 0   COVID-19 mRNA Vac-TriS, Pfizer, (PFIZER-BIONT COVID-19 VAC-TRIS) SUSP injection Inject into the muscle. 0.3 mL 0   ELIQUIS 5 MG TABS tablet Take 1 tablet by mouth twice daily (Patient taking differently: Take 5 mg by mouth 2 (two) times daily.) 180 tablet 1   gabapentin (NEURONTIN) 300 MG capsule TAKE 3 CAPSULES BY MOUTH THREE TIMES DAILY 270 capsule 0   Multiple Vitamin (MULTIVITAMIN WITH MINERALS) TABS tablet Take 1 tablet by mouth daily.      Multiple Vitamins-Minerals (MULTIVITAMIN WITH MINERALS) tablet Take 1 tablet by mouth daily.     OVER THE COUNTER MEDICATION Apply 1 application topically daily as needed (Pain). Hemp freeze     oxyCODONE (ROXICODONE) 5 MG immediate release tablet Take 1 tablet (5 mg total) by mouth every 4 (four) hours as needed. 15 tablet 0   sildenafil (VIAGRA) 100 MG tablet Take 100 mg by mouth daily as needed for erectile dysfunction.     No current facility-administered medications on file prior to  visit.    BP 109/67   Pulse 91   Temp 98.6 F (37 C)   Resp 17   Ht '5\' 10"'$  (1.778 m)   Wt 221 lb 6.4 oz (100.4 kg)   SpO2 100%   BMI 31.77 kg/m        Objective:   Physical Exam  General Mental Status- Alert. General Appearance- Not in acute distress.   Skin General: Color- Normal Color. Moisture- Normal Moisture.  Neck Carotid Arteries- Normal color. Moisture- Normal Moisture. No carotid bruits. No JVD.  Chest and Lung Exam Auscultation: Breath Sounds:-Normal.  Cardiovascular Auscultation:Rythm- Regular. Murmurs & Other Heart Sounds:Auscultation of the heart reveals- No Murmurs.  Abdomen Inspection:-Inspeection Normal. Palpation/Percussion:Note:No mass. Palpation and Percussion of the abdomen reveal- Non Tender, Non Distended + BS, no rebound or guarding.  Neurologic Cranial Nerve exam:- CN III-XII intact(No nystagmus), symmetric smile. Strength:- 5/5 equal and symmetric strength both upper and lower extremities.       Assessment & Plan:  Hx of post herpetic neuralgia controlled by higher dose gabapentin. Refilled med today and will get metabolic panel. Want to check kidney function and review dosing with your pcp. On review sounds like high dose appropiate/needed due to severity of pain. Failed lyrica in past. (Dose reduction of gabapentin lead to recurrent pain).  Follow up as regularly scheduled with pcp or sooner if needed

## 2021-07-30 NOTE — Telephone Encounter (Signed)
Patient notified

## 2021-07-30 NOTE — Patient Instructions (Addendum)
Hx of post herpetic neuralgia controlled by higher dose gabapentin. Refilled med today and will get metabolic panel. Want to check kidney function and review dosing with your pcp. On review sounds like high dose appropiate/needed due to severity of pain. Failed lyrica in past. (Dose reduction of gabapentin lead to recurrent pain).  Follow up as regularly scheduled with pcp or sooner if needed  Decided to take out cmp. Did find recent cmp.

## 2021-08-05 ENCOUNTER — Ambulatory Visit (INDEPENDENT_AMBULATORY_CARE_PROVIDER_SITE_OTHER): Payer: Medicare Other

## 2021-08-05 VITALS — Ht 70.0 in | Wt 212.0 lb

## 2021-08-05 DIAGNOSIS — Z0000 Encounter for general adult medical examination without abnormal findings: Secondary | ICD-10-CM

## 2021-08-05 NOTE — Patient Instructions (Signed)
[name] [name] , Thank you for taking time to complete your Medicare Wellness Visit. I appreciate your ongoing commitment to your health goals. Please review the following plan we discussed and let me know if I can assist you in the future.   Screening recommendations/referrals: Colonoscopy: No longer required Recommended yearly ophthalmology/optometry visit for glaucoma screening and checkup Recommended yearly dental visit for hygiene and checkup  Vaccinations: Influenza vaccine: Up to date Pneumococcal vaccine: Up to date Tdap vaccine: Discuss with pharmacy Shingles vaccine: Completed vaccines   Covid-19: Up to date  Advanced directives: Copy in chart  Conditions/risks identified: See problem list  Next appointment: Follow up in one year for your annual wellness visit. 08/09/2022 @ 11:00  Preventive Care 65 Years and Older, Male Preventive care refers to lifestyle choices and visits with your health care provider that can promote health and wellness. What does preventive care include? A yearly physical exam. This is also called an annual well check. Dental exams once or twice a year. Routine eye exams. Ask your health care provider how often you should have your eyes checked. Personal lifestyle choices, including: Daily care of your teeth and gums. Regular physical activity. Eating a healthy diet. Avoiding tobacco and drug use. Limiting alcohol use. Practicing safe sex. Taking low doses of aspirin every day. Taking vitamin and mineral supplements as recommended by your health care provider. What happens during an annual well check? The services and screenings done by your health care provider during your annual well check will depend on your age, overall health, lifestyle risk factors, and family history of disease. Counseling  Your health care provider may ask you questions about your: Alcohol use. Tobacco use. Drug use. Emotional well-being. Home and relationship  well-being. Sexual activity. Eating habits. History of falls. Memory and ability to understand (cognition). Work and work Statistician. Screening  You may have the following tests or measurements: Height, weight, and BMI. Blood pressure. Lipid and cholesterol levels. These may be checked every 5 years, or more frequently if you are over 17 years old. Skin check. Lung cancer screening. You may have this screening every year starting at age 79 if you have a 30-pack-year history of smoking and currently smoke or have quit within the past 15 years. Fecal occult blood test (FOBT) of the stool. You may have this test every year starting at age 60. Flexible sigmoidoscopy or colonoscopy. You may have a sigmoidoscopy every 5 years or a colonoscopy every 10 years starting at age 66. Prostate cancer screening. Recommendations will vary depending on your family history and other risks. Hepatitis C blood test. Hepatitis B blood test. Sexually transmitted disease (STD) testing. Diabetes screening. This is done by checking your blood sugar (glucose) after you have not eaten for a while (fasting). You may have this done every 1-3 years. Abdominal aortic aneurysm (AAA) screening. You may need this if you are a current or former smoker. Osteoporosis. You may be screened starting at age 7 if you are at high risk. Talk with your health care provider about your test results, treatment options, and if necessary, the need for more tests. Vaccines  Your health care provider may recommend certain vaccines, such as: Influenza vaccine. This is recommended every year. Tetanus, diphtheria, and acellular pertussis (Tdap, Td) vaccine. You may need a Td booster every 10 years. Zoster vaccine. You may need this after age 24. Pneumococcal 13-valent conjugate (PCV13) vaccine. One dose is recommended after age 58. Pneumococcal polysaccharide (PPSV23) vaccine. One dose is  recommended after age 60. Talk to your health care  provider about which screenings and vaccines you need and how often you need them. This information is not intended to replace advice given to you by your health care provider. Make sure you discuss any questions you have with your health care provider. Document Released: 01/09/2016 Document Revised: 09/01/2016 Document Reviewed: 10/14/2015 Elsevier Interactive Patient Education  2017 [place] Prevention in the Home Falls can cause injuries. They can happen to people of all ages. There are many things you can do to make your home safe and to help prevent falls. What can I do on the outside of my home? Regularly fix the edges of walkways and driveways and fix any cracks. Remove anything that might make you trip as you walk through a door, such as a raised step or threshold. Trim any bushes or trees on the path to your home. Use bright outdoor lighting. Clear any walking paths of anything that might make someone trip, such as rocks or tools. Regularly check to see if handrails are loose or broken. Make sure that both sides of any steps have handrails. Any raised decks and porches should have guardrails on the edges. Have any leaves, snow, or ice cleared regularly. Use sand or salt on walking paths during winter. Clean up any spills in your garage right away. This includes oil or grease spills. What can I do in the bathroom? Use night lights. Install grab bars by the toilet and in the tub and shower. Do not use towel bars as grab bars. Use non-skid mats or decals in the tub or shower. If you need to sit down in the shower, use a plastic, non-slip stool. Keep the floor dry. Clean up any water that spills on the floor as soon as it happens. Remove soap buildup in the tub or shower regularly. Attach bath mats securely with double-sided non-slip rug tape. Do not have throw rugs and other things on the floor that can make you trip. What can I do in the bedroom? Use night lights. Make  sure that you have a light by your bed that is easy to reach. Do not use any sheets or blankets that are too big for your bed. They should not hang down onto the floor. Have a firm chair that has side arms. You can use this for support while you get dressed. Do not have throw rugs and other things on the floor that can make you trip. What can I do in the kitchen? Clean up any spills right away. Avoid walking on wet floors. Keep items that you use a lot in easy-to-reach places. If you need to reach something above you, use a strong step stool that has a grab bar. Keep electrical cords out of the way. Do not use floor polish or wax that makes floors slippery. If you must use wax, use non-skid floor wax. Do not have throw rugs and other things on the floor that can make you trip. What can I do with my stairs? Do not leave any items on the stairs. Make sure that there are handrails on both sides of the stairs and use them. Fix handrails that are broken or loose. Make sure that handrails are as long as the stairways. Check any carpeting to make sure that it is firmly attached to the stairs. Fix any carpet that is loose or worn. Avoid having throw rugs at the top or bottom of the stairs. If  you do have throw rugs, attach them to the floor with carpet tape. Make sure that you have a light switch at the top of the stairs and the bottom of the stairs. If you do not have them, ask someone to add them for you. What else can I do to help prevent falls? Wear shoes that: Do not have high heels. Have rubber bottoms. Are comfortable and fit you well. Are closed at the toe. Do not wear sandals. If you use a stepladder: Make sure that it is fully opened. Do not climb a closed stepladder. Make sure that both sides of the stepladder are locked into place. Ask someone to hold it for you, if possible. Clearly mark and make sure that you can see: Any grab bars or handrails. First and last steps. Where the  edge of each step is. Use tools that help you move around (mobility aids) if they are needed. These include: Canes. Walkers. Scooters. Crutches. Turn on the lights when you go into a dark area. Replace any light bulbs as soon as they burn out. Set up your furniture so you have a clear path. Avoid moving your furniture around. If any of your floors are uneven, fix them. If there are any pets around you, be aware of where they are. Review your medicines with your doctor. Some medicines can make you feel dizzy. This can increase your chance of falling. Ask your doctor what other things that you can do to help prevent falls. This information is not intended to replace advice given to you by your health care provider. Make sure you discuss any questions you have with your health care provider. Document Released: 10/09/2009 Document Revised: 05/20/2016 Document Reviewed: 01/17/2015 Elsevier Interactive Patient Education  2017 [employer].

## 2021-08-05 NOTE — Progress Notes (Addendum)
Subjective:   [name] [name] is a 80 y.o. male who presents for Medicare Annual/Subsequent preventive examination.  I connected with [name] [name] today by telephone and verified that I am speaking with the correct person using two identifiers. Location patient: home Location provider: work Persons participating in the virtual visit: patient, Marine scientist.    I discussed the limitations, risks, security and privacy concerns of performing an evaluation and management service by telephone and the availability of in person appointments. I also discussed with the patient that there may be a patient responsible charge related to this service. The patient expressed understanding and verbally consented to this telephonic visit.    Interactive audio and video telecommunications were attempted between this provider and patient, however failed, due to patient having technical difficulties OR patient did not have access to video capability.  We continued and completed visit with audio only.  Some vital signs may be absent or patient reported.   Time Spent with patient on telephone encounter: 20 minutes   Review of Systems     Cardiac Risk Factors include: advanced age (>14mn, >>87women);male gender;dyslipidemia;obesity (BMI >30kg/m2)     Objective:    Today's Vitals   08/05/21 1101  Weight: 212 lb (96.2 kg)  Height: '5\' 10"'$  (1.778 m)   Body mass index is 30.42 kg/m.  Advanced Directives 08/05/2021 07/13/2021 10/10/2020 08/04/2020 10/05/2019 06/01/2019 06/19/2016  Does Patient Have a Medical Advance Directive? Yes Yes Yes Yes Yes Yes Yes  Type of AParamedicof AFountain InnLiving will H[place]th[place]Living will H[place]Living will HColumb[place]Living will - HFox IslandLiving will HTorontoLiving will  Does patient want to make changes to medical advance directive? - - - No - Patient declined No -  Patient declined No - Guardian declined -  Copy of [place]in Chart? Yes - validated most recent copy scanned in chart (See row information) - - Yes - validated most recent copy scanned in chart (See row information) - No - copy requested No - copy requested  Would patient like information on creating a medical advance directive? - - - - - No - Patient declined -  Pre-existing out of facility DNR order (yellow form or pink MOST form) - - - - - - -    Current Medications (verified) Outpatient Encounter Medications as of 08/05/2021  Medication Sig   Cholecalciferol (VITAMIN D3) 125 MCG (5000 UT) CAPS Take 1 capsule (5,000 Units total) by mouth daily. (Patient taking differently: Take 5,000 Units by mouth daily.)   ELIQUIS 5 MG TABS tablet Take 1 tablet by mouth twice daily (Patient taking differently: Take 5 mg by mouth 2 (two) times daily.)   gabapentin (NEURONTIN) 300 MG capsule Take 3 capsules (900 mg total) by mouth 3 (three) times daily.   Multiple Vitamins-Minerals (MULTIVITAMIN WITH MINERALS) tablet Take 1 tablet by mouth daily.   OVER THE COUNTER MEDICATION Apply 1 application topically daily as needed (Pain). Hemp freeze   sildenafil (VIAGRA) 100 MG tablet Take 100 mg by mouth daily as needed for erectile dysfunction.   Multiple Vitamin (MULTIVITAMIN WITH MINERALS) TABS tablet Take 1 tablet by mouth daily.    oxyCODONE (ROXICODONE) 5 MG immediate release tablet Take 1 tablet (5 mg total) by mouth every 4 (four) hours as needed.   [DISCONTINUED] COVID-19 mRNA Vac-TriS, Pfizer, (PFIZER-BIONT COVID-19 VAC-TRIS) SUSP injection Inject into the muscle.   No facility-administered encounter medications on  file as of 08/05/2021.    Allergies (verified) Patient has no known allergies.   History: Past Medical History:  Diagnosis Date   Atrial fibrillation (HCC)    BPH (benign prostatic hypertrophy)    Bradycardia 12/30/2014   HR routinely in mid 40s-50s   Coronary  artery disease (CAD) excluded 03/2015   False-positive nuclear stress test suggesting inferior ischemia   Dysrhythmia    Elevated PSA    Encounter for Medicare annual wellness exam 09/22/2013   Sees Dr Wilhemina Bonito of Derm Sees Dr Silvano Rusk of Gastroenterology Sees Dr Kathie Rhodes of [facility] Sees Dr Susa Day of Optometry   GERD (gastroesophageal reflux disease)    controlled w/ diet and behavioral changes, history of   Grade I diastolic dysfunction [id number]   History of kidney stones    Hypertriglyceridemia 03/16/2015   Lymphadenitis    Melanoma of back ([place])    Excised Dr Wilhemina Bonito   Mild ascending aorta dilatation ([place])    New onset a-fib ([place]) 03/10/2015   OA (osteoarthritis)    Otitis, externa, infective 08/10/2015   Palpitations    Pneumonia 2004   Post herpetic neuralgia    Shingles 07/09/2013   Sleep apnea 07/30/2011   cpap- 13    Snoring disorder 07/30/2011   Past Surgical History:  Procedure Laterality Date   CATARACT EXTRACTION Right    COLONOSCOPY  2018   CYSTOSCOPY/URETEROSCOPY/HOLMIUM LASER/STENT PLACEMENT Left 10/05/2019   Procedure: CYSTOSCOPY/RETROGRADE/URETEROSCOPY/HOLMIUM LASER/STENT PLACEMENT;  Surgeon: Kathie Rhodes, MD;  Location: [place];  Service: Urology;  Laterality: Left;   EYE SURGERY Left 12/06/2017   cataract by Dr Logan Bores HERNIA REPAIR Bilateral 06/01/2019   Procedure: LAPAROSCOPIC LEFT AND  RIGHT INGUINAL HERNIA REPAIR WITH MESH;  Surgeon: Michael Boston, MD;  Location: [facility];  Service: General;  Laterality: Bilateral;   LEFT HEART CATHETERIZATION WITH CORONARY ANGIOGRAM N/A 04/10/2015   Procedure: LEFT HEART CATHETERIZATION WITH  CORONARY ANGIOGRAM;  Surgeon: Leonie Man, MD;  Location: [facility] CATH LAB;  Service: Cardiovascular;  Angiographicallly NORMAL CORONARY ARTERIES   MASS EXCISION N/A 05/16/2014   Procedure: EXCISION POSTERIOR NECK MASS, RIGHT CHEST WALL MASS AND RIGHT AXILLARY MASS  AXILLARY NODE DISSECTION;  Surgeon: Adin Hector, MD;  Location: [facility];  Service: General;  Laterality: N/A;   NM MYOVIEW LTD  03/19/2015   FALSE POSITIVE:  INTERMEDIATE RISK. Small sized, moderate intensity inferior ischemic perfusion defect   REVERSE SHOULDER ARTHROPLASTY Left 10/16/2020   Procedure: REVERSE SHOULDER ARTHROPLASTY;  Surgeon: Justice Britain, MD;  Location: [facility];  Service: Orthopedics;  Laterality: Left;  151mn   TRANSTHORACIC ECHOCARDIOGRAM  03/20/2015   Normal LV size and function. EF 60-65%. G1 DD. Trivial AI. Borderline aortic root dilation (41 mm), Mild LA dilation.   VEIN LIGATION AND STRIPPING Left 07/13/2021   Procedure: LIGATION AND STRIPPING OF LEFT GREAT SAPHENOUS VEIN WITH STAB PHLEBECTOMY GREATER THAN 20 INCISIONS TO LEFT LEG;  Surgeon: DAngelia Mould MD;  [facility];  Service: Vascular;  Laterality: Left;   Family History  Problem Relation Age of Onset   Cancer Mother 654      MM, leukemia   Obesity Mother    Emphysema Father 632  Obesity Father    Alcohol abuse Father    Cancer Sister 562      brain   COPD Brother 730  Heart disease Paternal Grandmother    COPD Paternal Grandfather  Diabetes Sister 13   Obesity Sister    Down syndrome Brother 45       aspirated   Alcohol abuse Other    Colon cancer Neg Hx    Social History   Socioeconomic History   Marital status: Married    Spouse name: [name]   Number of children: 2   Years of education: Not on file   Highest education level: Not on file  Occupational History   Occupation: retired    Comment: truck driver  Tobacco Use   Smoking status: Former    Types: Pipe, Cigars    Quit date: 02/11/1989    Years since quitting: 32.5   Smokeless tobacco: Never  Vaping Use   Vaping Use: Never used  Substance and Sexual Activity   Alcohol use: Yes    Alcohol/week: 7.0 standard drinks    Types: 7 Glasses of wine per week    Comment: 1 per day -- brandy or wine   Drug use: No    Sexual activity: Yes    Comment: lives with wife, retired from [employer] driving, no dietary restrictions  Other Topics Concern   Not on file  Social History Narrative   He is a married father of 2, and grandfather of 10. He has been married to his wife [name] for 51 years. He previously lived in [place] but has moved with his wife to [place], [place] to be close to his daughter [name] and her family. [name] is our Nurse, adult.   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 He quit smoking in 1990. He has 6-7 glasses of brandy or wine a week.    He exercises regularly with low impact aerobics 2 days a week and daily walks of 30-45 minutes. His exercise sessions or 60 minutes. He will do some type of exercise at least 7 days a week and is very active doing yard work and wood work.      Patient is right-handed. He lives with his wife in a one level home with a basement. He drinks 4-5 cups of decaf coffee a day. He and his wife go to the gym 1-2 x a week.   Social Determinants of Health   Financial Resource Strain: Low Risk    Difficulty of Paying Living Expenses: Not hard at all  Food Insecurity: No Food Insecurity   Worried About Charity fundraiser in the Last Year: Never true   [place] in the Last Year: Never true  Transportation Needs: No Transportation Needs   Lack of Transportation (Medical): No   Lack of Transportation (Non-Medical): No  Physical Activity: Inactive   Days of Exercise per Week: 0 days   Minutes of Exercise per Session: 0 min  Stress: No Stress Concern Present   Feeling of Stress : Not at all  Social Connections: Moderately Isolated   Frequency of Communication with Friends and Family: More than three times a week   Frequency of Social Gatherings with Friends and Family: More than three times a week   Attends Religious Services: Never   Marine scientist or Organizations: No   Attends Arts administrator: Never   Marital Status: Married    Tobacco Counseling Counseling given: Not Answered   Clinical Intake:  Pre-visit preparation completed: Yes  Pain : No/denies pain     Nutritional Status: BMI > 30  Obese Nutritional Risks: None Diabetes: No  How often do you need to have someone help you when you read instructions, pamphlets, or other written materials from your doctor or pharmacy?: 1 - Never  Diabetic?No  Interpreter Needed?: No  Information entered by :: Caroleen Hamman LPN   Activities of Daily Living In your present state of health, do you have any difficulty performing the following activities: 08/05/2021 07/13/2021  Hearing? N -  [place]? N -  Difficulty concentrating or making decisions? N -  Walking or climbing stairs? N -  Dressing or bathing? N -  Doing errands, shopping? N N  Preparing Food and eating ? N -  Using the Toilet? N -  In the past six months, have you accidently leaked urine? N -  Do you have problems with loss of bowel control? N -  Managing your Medications? N -  Managing your Finances? N -  Housekeeping or managing your Housekeeping? N -  Some recent data might be hidden    Patient Care Team: Mosie Lukes, MD as PCP - General (Family Medicine) Leonie Man, MD as PCP - Cardiology (Cardiology) Gatha Mayer, MD as Consulting Physician (Gastroenterology) Danella Sensing, MD as Consulting Physician (Dermatology) Kathie Rhodes, MD (Inactive) as Consulting Physician (Urology) Leonie Man, MD as Consulting Physician (Cardiology) Lynda Rainwater, DDS as Consulting Physician (Dentistry) Michael Boston, MD as Consulting Physician (General Surgery) Day, Melvenia Beam, [facility] (Inactive) (Pharmacist) Day, Melvenia Beam, [facility] (Inactive) as Pharmacist (Pharmacist)  Indicate any recent Medical Services you may have received from other than [facility] providers in the past year (date may be approximate).     Assessment:   This is a  routine wellness examination for [name] [name].  Hearing/Vision screen Hearing Screening - Comments:: Bilateral hearing aids Vision Screening - Comments:: Reading glasses Last eye exam-10/2020-Dr. Lyles  Dietary issues and exercise activities discussed: Current Exercise Habits: The patient does not participate in regular exercise at present, Exercise limited by: None identified   Goals Addressed             This Visit's Progress    Maintain healthy lifestyle.   On track      Depression Screen PHQ 2/9 Scores 08/05/2021 08/04/2020 08/18/2017 06/09/2017 03/30/2016 03/10/2015 03/10/2015  PHQ - 2 Score 0 0 1 0 0 0 0  PHQ- 9 Score - - 7 - - 0 -    Fall Risk Fall Risk  08/05/2021 08/04/2020 12/14/2018 06/09/2017 03/30/2016  Falls in the past year? 0 0 0 No No  Number falls in past yr: 1 0 - - -  Injury with Fall? 0 0 - - -  Follow up Falls prevention discussed Education provided;Falls prevention discussed Falls evaluation completed - -    FALL RISK PREVENTION PERTAINING TO THE HOME:  Any stairs in or around the home? Yes  If so, are there any without handrails? No  Home free of loose throw rugs in walkways, pet beds, electrical cords, etc? Yes  Adequate lighting in your home to reduce risk of falls? Yes   ASSISTIVE DEVICES UTILIZED TO PREVENT FALLS:  Life alert? No  Use of a cane, walker or w/c? No  Grab bars in the bathroom? Yes  Shower chair or bench in shower? No  Elevated toilet seat or a handicapped toilet? No   TIMED UP AND GO:  Was the test performed? No . Phone visit   Cognitive Function:Normal cognitive status assessed by this Nurse Health Advisor. No abnormalities found.   MMSE - Mini  Mental State Exam 03/30/2016  Orientation to time 5  Orientation to Place 5  Registration 3  Attention/ Calculation 5  Recall 2  Language- name 2 objects 2  Language- repeat 1  Language- follow 3 step command 3  Language- read & follow direction 1  Write a sentence 1  Copy design 1  Total  score 29     6CIT Screen 08/04/2020  What Year? 0 points  What month? 0 points  What time? 0 points  Count back from 20 0 points  Months in reverse 0 points  Repeat phrase 0 points  Total Score 0    Immunizations Immunization History  Administered Date(s) Administered   Fluad Quad(high Dose 65+) 10/11/2019, 09/11/2020   Influenza Whole 09/15/2009, 09/23/2010   Influenza, High Dose Seasonal PF 09/21/2016, 11/07/2017, 10/06/2018   Influenza,inj,Quad PF,6+ Mos 09/20/2013, 10/18/2014, 09/08/2015   PFIZER Comirnaty(Gray Top)Covid-19 Tri-Sucrose Vaccine 05/18/2021   PFIZER(Purple Top)SARS-COV-2 Vaccination 01/11/2020, 02/01/2020, 10/03/2020   Pneumococcal Conjugate-13 11/19/2013   Pneumococcal Polysaccharide-23 04/09/2003, 10/31/2015   Td 06/04/2010   Zoster Recombinat (Shingrix) 09/30/2017, 12/08/2017    TDAP status: Due, Education has been provided regarding the importance of this vaccine. Advised may receive this vaccine at local pharmacy or Health Dept. Aware to provide a copy of the vaccination record if obtained from local pharmacy or Health Dept. Verbalized acceptance and understanding.  Flu Vaccine status: Up to date  Pneumococcal vaccine status: Up to date  Covid-19 vaccine status: Completed vaccines  Qualifies for Shingles Vaccine? No   Zostavax completed No   Shingrix Completed?: Yes  Screening Tests Health Maintenance  Topic Date Due   Hepatitis C Screening  Never done   TETANUS/TDAP  06/04/2020   INFLUENZA VACCINE  07/27/2021   COVID-19 Vaccine (5 - Booster for Pfizer series) 09/18/2021   PNA vac Low Risk Adult  Completed   Zoster Vaccines- Shingrix  Completed   HPV VACCINES  Aged Out    Health Maintenance  Health Maintenance Due  Topic Date Due   Hepatitis C Screening  Never done   TETANUS/TDAP  06/04/2020   INFLUENZA VACCINE  07/27/2021    Colorectal cancer screening: No longer required.   Lung Cancer Screening: (Low Dose CT Chest recommended if  Age 25-80 years, 30 pack-year currently smoking OR have quit w/in 15years.) does not qualify.     Additional Screening:  Hepatitis C Screening: does not qualify  Vision Screening: Recommended annual ophthalmology exams for early detection of glaucoma and other disorders of the eye. Is the patient up to date with their annual eye exam?  Yes  Who is the provider or what is the name of the office in which the patient attends annual eye exams? Dr. Prudencio Burly   Dental Screening: Recommended annual dental exams for proper oral hygiene  Community Resource Referral / Chronic Care Management: CRR required this visit?  No   CCM required this visit?  No      Plan:     I have personally reviewed and noted the following in the patient's chart:   Medical and social history Use of alcohol, tobacco or illicit drugs  Current medications and supplements including opioid prescriptions. Patient is not currently taking opioid prescriptions. Functional ability and status Nutritional status Physical activity Advanced directives List of other physicians Hospitalizations, surgeries, and ER visits in previous 12 months Vitals Screenings to include cognitive, depression, and falls Referrals and appointments  In addition, I have reviewed and discussed with patient certain preventive protocols, quality metrics,  and best practice recommendations. A written personalized care plan for preventive services as well as general preventive health recommendations were provided to patient.   Due to this being a telephonic visit, the after visit summary with patients personalized plan was offered to patient via mail or my-chart. Patient would like to access on my-chart.   Marta Antu, LPN   [id number]  Nurse Health Advisor  Nurse Notes: None  I have reviewed and agree with Health Coaches documentation.  [name], MD

## 2021-08-09 NOTE — Progress Notes (Signed)
08/10/21- 64 yoM M former pipe smoker for sleep evaluation courtesy of Dr [name] with concern of OSA on CPAP Medical problem list includes HTN, CAD, Gr 1 DD, GERD, Melanoma, Obesity,  NPSG 11/13/13- AHI 37/ hr, desaturation to 81%, CPAP to 13, body weight 220 lbs CPAP  13 / [facility] Download- compliance 100%, AHI 4.6/ hr Epworth score-11 Body weight today-219 lbs Covid vax-4 Phizer Machine giving motor end of life message, but still runs ok. He never sleeps without it, doubts he could. Asked about Dawna Part but decided he was too comfortable with CPAP to consider surgery.  Breathing ok. Keeps elastic sleeve on R lower arm since lipoma ressected.  Prior to Admission medications   Medication Sig Start Date End Date Taking? Authorizing Provider  Cholecalciferol (VITAMIN D3) 125 MCG (5000 UT) CAPS Take 1 capsule (5,000 Units total) by mouth daily. Patient taking differently: Take 5,000 Units by mouth daily. 08/27/20  Yes Beasley, Caren D, MD  ELIQUIS 5 MG TABS tablet Take 1 tablet by mouth twice daily Patient taking differently: Take 5 mg by mouth 2 (two) times daily. 05/05/21  Yes Leonie Man, MD  gabapentin (NEURONTIN) 300 MG capsule Take 3 capsules (900 mg total) by mouth 3 (three) times daily. 07/30/21  Yes Saguier, Percell Miller, PA-C  Multiple Vitamins-Minerals (MULTIVITAMIN WITH MINERALS) tablet Take 1 tablet by mouth daily.   Yes [provider]  OVER THE COUNTER MEDICATION Apply 1 application topically daily as needed (Pain). Hemp freeze   Yes [provider]  sildenafil (VIAGRA) 100 MG tablet Take 100 mg by mouth daily as needed for erectile dysfunction. 04/23/21  Yes [provider]  Multiple Vitamin (MULTIVITAMIN WITH MINERALS) TABS tablet Take 1 tablet by mouth daily.     [provider]  oxyCODONE (ROXICODONE) 5 MG immediate release tablet Take 1 tablet (5 mg total) by mouth every 4 (four) hours as needed. 07/13/21   Angelia Mould, MD   Past  Medical History:  Diagnosis Date   Atrial fibrillation [facility])    BPH (benign prostatic hypertrophy)    Bradycardia 12/30/2014   HR routinely in mid 40s-50s   Coronary artery disease (CAD) excluded 03/2015   False-positive nuclear stress test suggesting inferior ischemia   Dysrhythmia    Elevated PSA    Encounter for Medicare annual wellness exam 09/22/2013   Sees Dr Wilhemina Bonito of Derm Sees Dr Silvano Rusk of Gastroenterology Sees Dr Kathie Rhodes of [facility] Sees Dr Susa Day of Optometry   GERD (gastroesophageal reflux disease)    controlled w/ diet and behavioral changes, history of   Grade I diastolic dysfunction [id number]   History of kidney stones    Hypertriglyceridemia 03/16/2015   Lymphadenitis    Melanoma of back ([facility])    Excised Dr Wilhemina Bonito   Mild ascending aorta dilatation ([place])    New onset a-fib ([place]) 03/10/2015   OA (osteoarthritis)    Otitis, externa, infective 08/10/2015   Palpitations    Pneumonia 2004   Post herpetic neuralgia    Shingles 07/09/2013   Sleep apnea 07/30/2011   cpap- 13    Snoring disorder 07/30/2011   Past Surgical History:  Procedure Laterality Date   CATARACT EXTRACTION Right    COLONOSCOPY  2018   CYSTOSCOPY/URETEROSCOPY/HOLMIUM LASER/STENT PLACEMENT Left 10/05/2019   Procedure: CYSTOSCOPY/RETROGRADE/URETEROSCOPY/HOLMIUM LASER/STENT PLACEMENT;  Surgeon: Kathie Rhodes, MD;  Location: [place];  Service: Urology;  Laterality: Left;   EYE SURGERY Left 12/06/2017   cataract by  Dr Logan Bores HERNIA REPAIR Bilateral 06/01/2019   Procedure: LAPAROSCOPIC LEFT AND  RIGHT INGUINAL HERNIA REPAIR WITH MESH;  Surgeon: Michael Boston, MD;  Location: [facility];  Service: General;  Laterality: Bilateral;   LEFT HEART CATHETERIZATION WITH CORONARY ANGIOGRAM N/A 04/10/2015   Procedure: LEFT HEART CATHETERIZATION WITH  CORONARY ANGIOGRAM;  Surgeon: Leonie Man, MD;  Location[facility] CATH LAB;  Service:  Cardiovascular;  Angiographicallly NORMAL CORONARY ARTERIES   MASS EXCISION N/A 05/16/2014   Procedure: EXCISION POSTERIOR NECK MASS, RIGHT CHEST WALL MASS AND RIGHT AXILLARY MASS AXILLARY NODE DISSECTION;  Surgeon: Adin Hector, MD;  Location: [facility];  Service: General;  Laterality: N/A;   NM MYOVIEW LTD  03/19/2015   FALSE POSITIVE:  INTERMEDIATE RISK. Small sized, moderate intensity inferior ischemic perfusion defect   REVERSE SHOULDER ARTHROPLASTY Left 10/16/2020   Procedure: REVERSE SHOULDER ARTHROPLASTY;  Surgeon: Justice Britain, MD;  Location: [facility];  Service: Orthopedics;  Laterality: Left;  15mn   TRANSTHORACIC ECHOCARDIOGRAM  03/20/2015   Normal LV size and function. EF 60-65%. G1 DD. Trivial AI. Borderline aortic root dilation (41 mm), Mild LA dilation.   VEIN LIGATION AND STRIPPING Left 07/13/2021   Procedure: LIGATION AND STRIPPING OF LEFT GREAT SAPHENOUS VEIN WITH STAB PHLEBECTOMY GREATER THAN 20 INCISIONS TO LEFT LEG;  Surgeon: DAngelia Mould MD;  [facility];  Service: Vascular;  Laterality: Left;   Family History  Problem Relation Age of Onset   Cancer Mother 675      MM, leukemia   Obesity Mother    Emphysema Father 614  Obesity Father    Alcohol abuse Father    Cancer Sister 539      brain   COPD Brother 745  Heart disease Paternal Grandmother    COPD Paternal Grandfather    Diabetes Sister 748  Obesity Sister    Down syndrome Brother 539      aspirated   Alcohol abuse Other    Colon cancer Neg Hx    Social History   Socioeconomic History   Marital status: Married    Spouse name: [name]   Number of children: 2   Years of education: Not on file   Highest education level: Not on file  Occupational History   Occupation: retired    Comment: truck driver  Tobacco Use   Smoking status: Former    Types: Pipe, Cigars    Quit date: 02/11/1989    Years since quitting: 32.5   Smokeless tobacco: Never  Vaping Use   Vaping Use: Never used   Substance and Sexual Activity   Alcohol use: Yes    Alcohol/week: 7.0 standard drinks    Types: 7 Glasses of wine per week    Comment: 1 per day -- brandy or wine   Drug use: No   Sexual activity: Yes    Comment: lives with wife, 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 [place] his daughter KErasmo Downerand her family. 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 He quit smoking in  1990. He has 6-7 glasses of brandy or wine a week.    He exercises regularly with low impact aerobics 2 days a week and daily walks of 30-45 minutes. His exercise sessions or 60 minutes. He will do some type of exercise at least 7 days a week and is very active doing yard work and wood work.      Patient is right-handed. He lives with his wife in a one level home with a basement. He drinks 4-5 cups of decaf coffee a day. He and his wife go to the gym 1-2 x a week.   Social Determinants of Health   Financial Resource Strain: Low Risk    Difficulty of Paying Living Expenses: Not hard at all  Food Insecurity: No Food Insecurity   Worried About Charity fundraiser in the Last Year: Never true   [place] in the Last Year: Never true  Transportation Needs: No Transportation Needs   Lack of Transportation (Medical): No   Lack of Transportation (Non-Medical): No  Physical Activity: Inactive   Days of Exercise per Week: 0 days   Minutes of Exercise per Session: 0 min  Stress: No Stress Concern Present   Feeling of Stress : Not at all  Social Connections: Moderately Isolated   Frequency of Communication with Friends and Family: More than three times a week   Frequency of  Social Gatherings with Friends and Family: More than three times a week   Attends Religious Services: Never   Marine scientist or Organizations: No   Attends Music therapist: Never   Marital Status: Married  Human resources officer Violence: Not At Risk   Fear of Current or Ex-Partner: No   Emotionally Abused: No   Physically Abused: No   Sexually Abused: No   ROS-see HPI   + = positive Constitutional:    weight loss, night sweats, fevers, chills, fatigue, lassitude. HEENT:    headaches, difficulty swallowing, tooth/dental problems, sore throat,       sneezing, itching, ear ache, nasal congestion, post nasal drip, snoring CV:    chest pain, orthopnea, PND, swelling in lower extremities, anasarca,                                  dizziness, palpitations Resp:   shortness of breath with exertion or at rest.                productive cough,   non-productive cough, coughing up of blood.              change in color of mucus.  wheezing.   Skin:    rash or lesions. GI:  No-   heartburn, indigestion, abdominal pain, nausea, vomiting, diarrhea,                 change in bowel habits, loss of appetite GU: dysuria, change in color of urine, no urgency or frequency.   flank pain. MS:   joint pain, stiffness, decreased range of motion, back pain. Neuro-     nothing unusual Psych:  change in mood or affect.  depression or anxiety.   memory loss.  OBJ- Physical Exam General- Alert, Oriented, Affect-appropriate, Distress- none acute Skin- rash-none, lesions- none, excoriation- none Lymphadenopathy- none Head- atraumatic            Eyes- Gross vision intact, PERRLA, conjunctivae and secretions clear  Ears- Hearing, canals-normal            Nose- Clear, no-Septal dev, mucus, polyps, erosion, perforation             Throat- Mallampati II-III , mucosa clear , drainage- none, tonsils- atrophic, + teeth Neck- flexible , trachea midline, no stridor , thyroid nl, carotid no  bruit Chest - symmetrical excursion , unlabored           Heart/CV- RRR , no murmur , no gallop  , no rub, nl s1 s2                           - JVD- none , edema- none, stasis changes- none, varices- none           Lung- clear to P&A, wheeze- none, cough- none , dullness-none, rub- none           Chest wall-  Abd-  Br/ Gen/ Rectal- Not done, not indicated Extrem- +R arm elastic sleeve Neuro- grossly intact to observation

## 2021-08-10 ENCOUNTER — Ambulatory Visit (INDEPENDENT_AMBULATORY_CARE_PROVIDER_SITE_OTHER): Payer: Medicare Other | Admitting: Internal Medicine

## 2021-08-10 ENCOUNTER — Other Ambulatory Visit: Payer: Self-pay

## 2021-08-10 ENCOUNTER — Encounter: Payer: Self-pay | Admitting: Internal Medicine

## 2021-08-10 VITALS — BP 124/62 | HR 75 | Temp 97.8°F | Ht 70.0 in | Wt 219.8 lb

## 2021-08-10 DIAGNOSIS — I4819 Other persistent atrial fibrillation: Secondary | ICD-10-CM

## 2021-08-10 DIAGNOSIS — G4733 Obstructive sleep apnea (adult) (pediatric): Secondary | ICD-10-CM | POA: Diagnosis not present

## 2021-08-10 DIAGNOSIS — Z9989 Dependence on other enabling machines and devices: Secondary | ICD-10-CM

## 2021-08-10 NOTE — Patient Instructions (Signed)
Order- DME [facility]    Please replace old CPAP machine- motor aging out. Change to autopap 5-15, mask of choice, humidifier, supplies, Airview/ card  Please call if we can help

## 2021-08-10 NOTE — Assessment & Plan Note (Signed)
Benefits from CPAP with good compliance and control Plan- replace old machine with auto 5-15

## 2021-08-10 NOTE — Assessment & Plan Note (Signed)
Rhythm regular, c/w RSR at this visit. Followed by cardiology

## 2021-08-17 ENCOUNTER — Other Ambulatory Visit: Payer: Self-pay

## 2021-08-17 ENCOUNTER — Encounter (INDEPENDENT_AMBULATORY_CARE_PROVIDER_SITE_OTHER): Payer: Self-pay | Admitting: Family Medicine

## 2021-08-17 ENCOUNTER — Ambulatory Visit (INDEPENDENT_AMBULATORY_CARE_PROVIDER_SITE_OTHER): Payer: Medicare Other | Admitting: Family Medicine

## 2021-08-17 VITALS — BP 119/65 | HR 65 | Temp 97.9°F | Ht 70.0 in | Wt 210.0 lb

## 2021-08-17 DIAGNOSIS — R7303 Prediabetes: Secondary | ICD-10-CM

## 2021-08-17 DIAGNOSIS — Z6835 Body mass index (BMI) 35.0-35.9, adult: Secondary | ICD-10-CM

## 2021-08-17 NOTE — Progress Notes (Signed)
Chief Complaint:   OBESITY [name] [name] is here to discuss his progress with his obesity treatment plan along with follow-up of his obesity related diagnoses. [name] [name] is on the Category 3 Plan or keeping a food journal and adhering to recommended goals of 1500-1700 calories and 100 grams of protein daily and states he is following his eating plan approximately 70% of the time. [name] [name] states he is active while doing yard work.  Today's visit was #: 55 Starting weight: 237 lbs Starting date: 08/18/2017 Today's weight: 237 lbs Today's date: 08/17/2021 Total lbs lost to date: 210 lbs Total lbs lost since last in-office visit: 27  Interim History: 0  Subjective:   1. Pre-diabetes [name] [name] recently had varicose vein surgery. He is mostly recovered and is back to his normal routine. His hunger is controlled. He would like to lose another 5 lbs.  Assessment/Plan:   1. Pre-diabetes [name] [name] will continue with diet, exercise, and decreasing simple carbohydrates to help decrease the risk of diabetes. Will continue to monitor.  2. Obesity with current BMI 30.2 [name] [name] is currently in the action stage of change. As such, his goal is to continue with weight loss efforts. He has agreed to the Category 3 Plan or keeping a food journal and adhering to recommended goals of 1500-1700 calories and 100+ grams of protein daily.   Exercise goals: As is, add strengthening exercises was discussed and demonstrated.   Behavioral modification strategies: travel eating strategies.  [name] [name] has agreed to follow-up with our clinic in 6 to 8 weeks. He was informed of the importance of frequent follow-up visits to maximize his success with intensive lifestyle modifications for his multiple health conditions.   Objective:   Blood pressure 119/65, pulse 65, temperature 97.9 F (36.6 C), height '5\' 10"'$  (1.778 m), weight 210 lb (95.3 kg), SpO2 99 %. Body mass index is 30.13 kg/m.  General: Cooperative, alert, well developed,  in no acute distress. HEENT: Conjunctivae and lids unremarkable. Cardiovascular: Regular rhythm.  Lungs: Normal work of breathing. Neurologic: No focal deficits.   Lab Results  Component Value Date   CREATININE 1.10 07/13/2021   BUN 26 (H) 07/13/2021   NA 142 07/13/2021   K 4.2 07/13/2021   CL 107 07/13/2021   CO2 25 06/22/2021   Lab Results  Component Value Date   ALT 17 06/22/2021   AST 20 06/22/2021   ALKPHOS 62 06/22/2021   BILITOT 0.7 06/22/2021   Lab Results  Component Value Date   HGBA1C 5.6 06/22/2021   HGBA1C 5.7 (H) 12/03/2020   HGBA1C 5.5 07/23/2020   HGBA1C 5.6 04/21/2020   HGBA1C 5.7 (H) 12/31/2019   Lab Results  Component Value Date   INSULIN 6.2 06/22/2021   INSULIN 5.1 12/03/2020   INSULIN 5.2 07/23/2020   INSULIN 5.1 04/21/2020   INSULIN 10.5 12/31/2019   Lab Results  Component Value Date   TSH 1.51 09/11/2020   Lab Results  Component Value Date   CHOL 147 06/22/2021   HDL 57 06/22/2021   LDLCALC 77 06/22/2021   TRIG 62 06/22/2021   CHOLHDL 3 10/11/2019   Lab Results  Component Value Date   VD25OH 66.9 06/22/2021   VD25OH 73.1 12/03/2020   VD25OH 29.6 (L) 07/23/2020   Lab Results  Component Value Date   WBC 6.4 10/10/2020   HGB 14.6 07/13/2021   HCT 43.0 07/13/2021   MCV 96.8 10/10/2020   PLT 172 10/10/2020   No results found for: IRON, TIBC, FERRITIN  Attestation Statements:   Reviewed by clinician on day of visit: allergies, medications, problem list, medical history, surgical history, family history, social history, and previous encounter notes.  Time spent on visit including pre-visit chart review and post-visit care and charting was 32 minutes.    I, [name] [name], am acting as transcriptionist for [name] Nip, MD.  I have reviewed the above documentation for accuracy and completeness, and I agree with the above. -  [name] Nip, MD

## 2021-08-19 ENCOUNTER — Ambulatory Visit: Payer: Medicare Other

## 2021-09-28 ENCOUNTER — Other Ambulatory Visit: Payer: Self-pay

## 2021-09-28 ENCOUNTER — Encounter (INDEPENDENT_AMBULATORY_CARE_PROVIDER_SITE_OTHER): Payer: Self-pay | Admitting: Family Medicine

## 2021-09-28 ENCOUNTER — Ambulatory Visit (INDEPENDENT_AMBULATORY_CARE_PROVIDER_SITE_OTHER): Payer: Medicare Other | Admitting: Family Medicine

## 2021-09-28 VITALS — BP 127/79 | HR 69 | Temp 97.6°F | Ht 70.0 in | Wt 209.0 lb

## 2021-09-28 DIAGNOSIS — Z6835 Body mass index (BMI) 35.0-35.9, adult: Secondary | ICD-10-CM

## 2021-09-28 DIAGNOSIS — R7303 Prediabetes: Secondary | ICD-10-CM | POA: Diagnosis not present

## 2021-09-28 DIAGNOSIS — E559 Vitamin D deficiency, unspecified: Secondary | ICD-10-CM

## 2021-09-29 ENCOUNTER — Ambulatory Visit: Payer: Medicare Other | Attending: Internal Medicine

## 2021-09-29 DIAGNOSIS — Z23 Encounter for immunization: Secondary | ICD-10-CM

## 2021-09-29 NOTE — Progress Notes (Signed)
   Covid-19 Vaccination Clinic  Name:  [name] [name]    MRN: [id number] DOB: March 27, 1941  09/29/2021  [name] was observed post Covid-19 immunization for 15 minutes without incident. He was provided with Vaccine Information Sheet and instruction to access the V-Safe system.   [name] was instructed to call 911 with any severe reactions post vaccine: Difficulty breathing  Swelling of face and throat  A fast heartbeat  A bad rash all over body  Dizziness and weakness

## 2021-10-05 NOTE — Progress Notes (Signed)
Chief Complaint:   OBESITY [name] [name] is here to discuss his progress with his obesity treatment plan along with follow-up of his obesity related diagnoses. [name] [name] is on the Category 3 Plan or keeping a food journal and adhering to recommended goals of 1500-1700 calories and 100+ grams of protein daily and states he is following his eating plan approximately 75% of the time. [name] states he is being doing more activity.   Today's visit was #: 45 Starting weight: 237 lbs Starting date: 08/18/2017 Today's weight: 209 lbs Today's date: 09/28/2021 Total lbs lost to date: 28 Total lbs lost since last in-office visit: 1  Interim History: [name] [name] continues to do well with weight loss. He remains active and his hunger is well controlled. He is working on [employer] protein and he is getting support from his wife.  Subjective:   1. Pre-diabetes Ora's A1c is well controlled with diet and exercise. He feels well overall.  Assessment/Plan:   1. Pre-diabetes [name] [name] will continue to work on diet, exercise, and decreasing simple carbohydrates to help decrease the risk of diabetes. He will continue to follow up as directed.  2. Obesity with current BMI 30.1 [name] [name] is currently in the action stage of change. As such, his goal is to continue with weight loss efforts. He has agreed to keeping a food journal and adhering to recommended goals of 1500-1700 calories and 100+ grams of protein daily.   Exercise goals: As is.  Behavioral modification strategies: increasing lean protein intake and increasing water intake.  [name] [name] has agreed to follow-up with our clinic in 8 to 10 weeks. He was informed of the importance of frequent follow-up visits to maximize his success with intensive lifestyle modifications for his multiple health conditions.   Objective:   Blood pressure 127/79, pulse 69, temperature 97.6 F (36.4 C), height 5\' 10"  (1.778 m), weight 209 lb (94.8 kg), SpO2 98 %. Body mass index is  29.99 kg/m.  General: Cooperative, alert, well developed, in no acute distress. HEENT: Conjunctivae and lids unremarkable. Cardiovascular: Regular rhythm.  Lungs: Normal work of breathing. Neurologic: No focal deficits.   Lab Results  Component Value Date   CREATININE 1.10 07/13/2021   BUN 26 (H) 07/13/2021   NA 142 07/13/2021   K 4.2 07/13/2021   CL 107 07/13/2021   CO2 25 06/22/2021   Lab Results  Component Value Date   ALT 17 06/22/2021   AST 20 06/22/2021   ALKPHOS 62 06/22/2021   BILITOT 0.7 06/22/2021   Lab Results  Component Value Date   HGBA1C 5.6 06/22/2021   HGBA1C 5.7 (H) 12/03/2020   HGBA1C 5.5 07/23/2020   HGBA1C 5.6 04/21/2020   HGBA1C 5.7 (H) 12/31/2019   Lab Results  Component Value Date   INSULIN 6.2 06/22/2021   INSULIN 5.1 12/03/2020   INSULIN 5.2 07/23/2020   INSULIN 5.1 04/21/2020   INSULIN 10.5 12/31/2019   Lab Results  Component Value Date   TSH 1.51 09/11/2020   Lab Results  Component Value Date   CHOL 147 06/22/2021   HDL 57 06/22/2021   LDLCALC 77 06/22/2021   TRIG 62 06/22/2021   CHOLHDL 3 10/11/2019   Lab Results  Component Value Date   VD25OH 66.9 06/22/2021   VD25OH 73.1 12/03/2020   VD25OH 29.6 (L) 07/23/2020   Lab Results  Component Value Date   WBC 6.4 10/10/2020   HGB 14.6 07/13/2021   HCT 43.0 07/13/2021   MCV 96.8 10/10/2020   PLT  172 10/10/2020   No results found for: IRON, TIBC, FERRITIN  Attestation Statements:   Reviewed by clinician on day of visit: allergies, medications, problem list, medical history, surgical history, family history, social history, and previous encounter notes.  Time spent on visit including pre-visit chart review and post-visit care and charting was 32 minutes.    I, Trixie Dredge, am acting as transcriptionist for Dennard Nip, MD.  I have reviewed the above documentation for accuracy and completeness, and I agree with the above. -  Dennard Nip, MD

## 2021-10-09 ENCOUNTER — Other Ambulatory Visit (HOSPITAL_BASED_OUTPATIENT_CLINIC_OR_DEPARTMENT_OTHER): Payer: Self-pay

## 2021-10-09 MED ORDER — COVID-19MRNA BIVAL VACC PFIZER 30 MCG/0.3ML IM SUSP
INTRAMUSCULAR | 0 refills | Status: DC
  Filled 2021-10-09: qty 0.3, 1d supply, fill #0

## 2021-10-16 DIAGNOSIS — H04123 Dry eye syndrome of bilateral lacrimal glands: Secondary | ICD-10-CM | POA: Diagnosis not present

## 2021-10-16 DIAGNOSIS — Z961 Presence of intraocular lens: Secondary | ICD-10-CM | POA: Diagnosis not present

## 2021-10-16 DIAGNOSIS — H5203 Hypermetropia, bilateral: Secondary | ICD-10-CM | POA: Diagnosis not present

## 2021-10-16 DIAGNOSIS — H26492 Other secondary cataract, left eye: Secondary | ICD-10-CM | POA: Diagnosis not present

## 2021-10-22 DIAGNOSIS — H26492 Other secondary cataract, left eye: Secondary | ICD-10-CM | POA: Diagnosis not present

## 2021-11-06 ENCOUNTER — Other Ambulatory Visit: Payer: Self-pay | Admitting: Cardiology

## 2021-11-12 DIAGNOSIS — G4733 Obstructive sleep apnea (adult) (pediatric): Secondary | ICD-10-CM | POA: Diagnosis not present

## 2021-11-27 ENCOUNTER — Telehealth: Payer: Self-pay

## 2021-11-27 NOTE — Telephone Encounter (Signed)
Please contact requesting office.  We do not provide blanket coverage.  We will need to know details surrounding his dental surgery/dental procedure.  At that time we will know if pharmacy needs to make recommendations on holding his Eliquis.  I will leave him in the preoperative pool and await details surrounding surgery.  Thank you.  Jossie Ng. Melita Villalona NP-C    11/27/2021, 2:17 PM [address] Office [phone] Fax [phone]

## 2021-11-27 NOTE — Telephone Encounter (Signed)
Tried to call the DDS office though they are closed on Friday afternoon. Will have to call back on Monday.

## 2021-11-27 NOTE — Telephone Encounter (Signed)
   [facility] Pre-operative Risk Assessment    Patient Name: Jaiven Graveline  DOB: 1941-09-20 MRN: [id number]  HEARTCARE STAFF:  - IMPORTANT!!!!!! Under Visit Info/Reason for Call, type in Other and utilize the format Clearance MM/DD/YY or Clearance TBD. Do not use dashes or single digits. - Please review there is not already an duplicate clearance open for this procedure. - If request is for dental extraction, please clarify the # of teeth to be extracted. - If the patient is currently at the dentist's office, call Pre-Op Callback Staff (MA/nurse) to input urgent request.  - If the patient is not currently in the dentist office, please route to the Pre-Op pool.  Request for surgical clearance:  What type of surgery is being performed? DENTAL SURGERY   When is this surgery scheduled? TBD  What type of clearance is required (medical clearance vs. Pharmacy clearance to hold med vs. Both)? PHARMACY  Are there any medications that need to be held prior to surgery and how long? ELIQUIS 2 DAYS PRIOR  Practice name and name of physician performing surgery? Eliane Decree, DDS, MSD  What is the office phone number? [phone]   7.   What is the office fax number? [phone]  8.   Anesthesia type (None, local, MAC, general) ? NOT LISTED

## 2021-11-28 ENCOUNTER — Other Ambulatory Visit: Payer: Self-pay | Admitting: Medical

## 2021-11-30 ENCOUNTER — Encounter (INDEPENDENT_AMBULATORY_CARE_PROVIDER_SITE_OTHER): Payer: Self-pay | Admitting: Family Medicine

## 2021-11-30 ENCOUNTER — Ambulatory Visit (INDEPENDENT_AMBULATORY_CARE_PROVIDER_SITE_OTHER): Payer: Medicare Other | Admitting: Family Medicine

## 2021-11-30 ENCOUNTER — Other Ambulatory Visit: Payer: Self-pay

## 2021-11-30 VITALS — BP 114/6 | HR 65 | Ht 70.0 in | Wt 211.0 lb

## 2021-11-30 DIAGNOSIS — E669 Obesity, unspecified: Secondary | ICD-10-CM | POA: Diagnosis not present

## 2021-11-30 DIAGNOSIS — Z6834 Body mass index (BMI) 34.0-34.9, adult: Secondary | ICD-10-CM

## 2021-11-30 DIAGNOSIS — E559 Vitamin D deficiency, unspecified: Secondary | ICD-10-CM

## 2021-11-30 DIAGNOSIS — E781 Pure hyperglyceridemia: Secondary | ICD-10-CM | POA: Diagnosis not present

## 2021-11-30 DIAGNOSIS — R7303 Prediabetes: Secondary | ICD-10-CM

## 2021-11-30 NOTE — Progress Notes (Signed)
Chief Complaint:   OBESITY Jaceon is here to discuss his progress with his obesity treatment plan along with follow-up of his obesity related diagnoses. Walfred is on keeping a food journal and adhering to recommended goals of 1500-1700 calories and 100+ grams of protein daily and states he is following his eating plan approximately 75% of the time. Jeramie states he is actively moving around all day.   Today's visit was #: 63 Starting weight: 237 lbs Starting date: 08/18/2017 Today's weight: 211 lbs Today's date: 11/30/2021 Total lbs lost to date: 26 Total lbs lost since last in-office visit: 0  Interim History: Lovett has done well maintaining his weight loss in the last 6-7 weeks, even over Thanksgiving. His weight fluctuates 2-3 lbs from day to day due to small fluid shifts.  Subjective:   1. Pre-diabetes Deyvi continues to work on decreasing simple carbohydrates. He denies hypoglycemia or GI upset.   2. Vitamin D deficiency Dammon is on OTC Vit D, and his level has been at goal previously but he is at high risk of deficiency in the Winter.  3. Hypertriglyceridemia Mccall is doing well with decreasing simple carbohydrates, and he denies abdominal pain.  Assessment/Plan:   1. Pre-diabetes We will check labs today. Canden will continue to work on weight loss, exercise, and decreasing simple carbohydrates to help decrease the risk of diabetes.   - CMP14+EGFR - Insulin, random - Hemoglobin A1c  2. Vitamin D deficiency We will check labs today. Allante will follow-up for routine testing of Vitamin D, at least 2-3 times per year to avoid over-replacement.  - VITAMIN D 25 Hydroxy (Vit-D Deficiency, Fractures)  3. Hypertriglyceridemia We discussed several lifestyle modifications today. We will check labs today. Gaston will continue to work on diet, exercise and weight loss efforts. Orders and follow up as documented in patient record.   - Lipid Panel With LDL/HDL Ratio - TSH  4.  Obesity BMI today is 46 Ramaj is currently in the action stage of change. As such, his goal is to continue with weight loss efforts. He has agreed to keeping a food journal and adhering to recommended goals of 1500-1700 calories and 100+ grams of protein daily.   Exercise goals: As is.  Behavioral modification strategies: decreasing sodium intake.  Jeffory has agreed to follow-up with our clinic in 8 weeks. He was informed of the importance of frequent follow-up visits to maximize his success with intensive lifestyle modifications for his multiple health conditions.   Masao was informed we would discuss his lab results at his next visit unless there is a critical issue that needs to be addressed sooner. Simcha agreed to keep his next visit at the agreed upon time to discuss these results.  Objective:   Blood pressure (!) 114/6, pulse 65, height '5\' 10"'  (1.778 m), weight 211 lb (95.7 kg), SpO2 96 %. Body mass index is 30.28 kg/m.  General: Cooperative, alert, well developed, in no acute distress. HEENT: Conjunctivae and lids unremarkable. Cardiovascular: Regular rhythm.  Lungs: Normal work of breathing. Neurologic: No focal deficits.   Lab Results  Component Value Date   CREATININE 1.10 07/13/2021   BUN 26 (H) 07/13/2021   NA 142 07/13/2021   K 4.2 07/13/2021   CL 107 07/13/2021   CO2 25 06/22/2021   Lab Results  Component Value Date   ALT 17 06/22/2021   AST 20 06/22/2021   ALKPHOS 62 06/22/2021   BILITOT 0.7 06/22/2021   Lab Results  Component  Value Date   HGBA1C 5.6 06/22/2021   HGBA1C 5.7 (H) 12/03/2020   HGBA1C 5.5 07/23/2020   HGBA1C 5.6 04/21/2020   HGBA1C 5.7 (H) 12/31/2019   Lab Results  Component Value Date   INSULIN 6.2 06/22/2021   INSULIN 5.1 12/03/2020   INSULIN 5.2 07/23/2020   INSULIN 5.1 04/21/2020   INSULIN 10.5 12/31/2019   Lab Results  Component Value Date   TSH 1.51 09/11/2020   Lab Results  Component Value Date   CHOL 147 06/22/2021   HDL  57 06/22/2021   LDLCALC 77 06/22/2021   TRIG 62 06/22/2021   CHOLHDL 3 10/11/2019   Lab Results  Component Value Date   VD25OH 66.9 06/22/2021   VD25OH 73.1 12/03/2020   VD25OH 29.6 (L) 07/23/2020   Lab Results  Component Value Date   WBC 6.4 10/10/2020   HGB 14.6 07/13/2021   HCT 43.0 07/13/2021   MCV 96.8 10/10/2020   PLT 172 10/10/2020   No results found for: IRON, TIBC, FERRITIN  Obesity Behavioral Intervention:   Approximately 15 minutes were spent on the discussion below.  ASK: We discussed the diagnosis of obesity with [name] today and Alyas agreed to give [name] permission to discuss obesity behavioral modification therapy today.  ASSESS: Daine has the diagnosis of obesity and his BMI today is 30.3. Freedom is in the action stage of change.   ADVISE: Delphin was educated on the multiple health risks of obesity as well as the benefit of weight loss to improve his health. He was advised of the need for long term treatment and the importance of lifestyle modifications to improve his current health and to decrease his risk of future health problems.  AGREE: Multiple dietary modification options and treatment options were discussed and Rusell agreed to follow the recommendations documented in the above note.  ARRANGE: Mars was educated on the importance of frequent visits to treat obesity as outlined per CMS and USPSTF guidelines and agreed to schedule his next follow up appointment today.  Attestation Statements:   Reviewed by clinician on day of visit: allergies, medications, problem list, medical history, surgical history, family history, social history, and previous encounter notes.   I, Trixie Dredge, am acting as transcriptionist for Dennard Nip, MD.  I have reviewed the above documentation for accuracy and completeness, and I agree with the above. -  Dennard Nip, MD

## 2021-11-30 NOTE — Telephone Encounter (Signed)
Patient with diagnosis of afib on Eliquis for anticoagulation.    Procedure: APICOECTOMY Date of procedure: 12/02/21  CHA2DS2-VASc Score = 2   This indicates a 2.2% annual risk of stroke. The patient's score is based upon: CHF History: 0 HTN History: 0 Diabetes History: 0 Stroke History: 0 Vascular Disease History: 0 Age Score: 2 Gender Score: 0     CrCl 64 ml/min  Patient does NOT require pre-op antibiotics for dental procedure.  Per office protocol, patient can hold Eliquis for 2 days prior to procedure.

## 2021-11-30 NOTE — Telephone Encounter (Signed)
Please comment on Eliquis for dental procedure thanks

## 2021-11-30 NOTE — Telephone Encounter (Signed)
I s/w the dental office. Confirmed procedure to be done: Procedure: APICOECTOMY Anesthesia: SEPTACAINE or CHOICE.   I will update the pre op provider.

## 2021-12-01 DIAGNOSIS — L918 Other hypertrophic disorders of the skin: Secondary | ICD-10-CM | POA: Diagnosis not present

## 2021-12-01 DIAGNOSIS — D2272 Melanocytic nevi of left lower limb, including hip: Secondary | ICD-10-CM | POA: Diagnosis not present

## 2021-12-01 DIAGNOSIS — D225 Melanocytic nevi of trunk: Secondary | ICD-10-CM | POA: Diagnosis not present

## 2021-12-01 DIAGNOSIS — D485 Neoplasm of uncertain behavior of skin: Secondary | ICD-10-CM | POA: Diagnosis not present

## 2021-12-01 DIAGNOSIS — C4359 Malignant melanoma of other part of trunk: Secondary | ICD-10-CM | POA: Diagnosis not present

## 2021-12-01 DIAGNOSIS — L821 Other seborrheic keratosis: Secondary | ICD-10-CM | POA: Diagnosis not present

## 2021-12-01 DIAGNOSIS — Z85820 Personal history of malignant melanoma of skin: Secondary | ICD-10-CM | POA: Diagnosis not present

## 2021-12-01 DIAGNOSIS — D2271 Melanocytic nevi of right lower limb, including hip: Secondary | ICD-10-CM | POA: Diagnosis not present

## 2021-12-01 DIAGNOSIS — D2261 Melanocytic nevi of right upper limb, including shoulder: Secondary | ICD-10-CM | POA: Diagnosis not present

## 2021-12-01 LAB — CMP14+EGFR
ALT: 18 IU/L (ref 0–44)
AST: 20 IU/L (ref 0–40)
Albumin/Globulin Ratio: 1.8 (ref 1.2–2.2)
Albumin: 4.3 g/dL (ref 3.7–4.7)
Alkaline Phosphatase: 76 IU/L (ref 44–121)
BUN/Creatinine Ratio: 25 — ABNORMAL HIGH (ref 10–24)
BUN: 23 mg/dL (ref 8–27)
Bilirubin Total: 0.8 mg/dL (ref 0.0–1.2)
CO2: 25 mmol/L (ref 20–29)
Calcium: 9.3 mg/dL (ref 8.6–10.2)
Chloride: 104 mmol/L (ref 96–106)
Creatinine, Ser: 0.93 mg/dL (ref 0.76–1.27)
Globulin, Total: 2.4 g/dL (ref 1.5–4.5)
Glucose: 83 mg/dL (ref 70–99)
Potassium: 4.4 mmol/L (ref 3.5–5.2)
Sodium: 142 mmol/L (ref 134–144)
Total Protein: 6.7 g/dL (ref 6.0–8.5)
eGFR: 84 mL/min/{1.73_m2} (ref >59)

## 2021-12-01 LAB — LIPID PANEL WITH LDL/HDL RATIO
Cholesterol, Total: 174 mg/dL (ref 100–199)
HDL: 62 mg/dL (ref >39)
LDL Chol Calc (NIH): 99 mg/dL (ref 0–99)
LDL/HDL Ratio: 1.6 ratio (ref 0.0–3.6)
Triglycerides: 68 mg/dL (ref 0–149)
VLDL Cholesterol Cal: 13 mg/dL (ref 5–40)

## 2021-12-01 LAB — HEMOGLOBIN A1C
Est. average glucose Bld gHb Est-mCnc: 126 mg/dL
Hgb A1c MFr Bld: 6 % — ABNORMAL HIGH (ref 4.8–5.6)

## 2021-12-01 LAB — TSH: TSH: 2.84 u[IU]/mL (ref 0.450–4.500)

## 2021-12-01 LAB — VITAMIN D 25 HYDROXY (VIT D DEFICIENCY, FRACTURES): Vit D, 25-Hydroxy: 69.3 ng/mL (ref 30.0–100.0)

## 2021-12-01 LAB — INSULIN, RANDOM: INSULIN: 6.6 u[IU]/mL (ref 2.6–24.9)

## 2021-12-01 NOTE — Telephone Encounter (Signed)
   Name: [name] [name]  DOB: 1941/10/27  MRN: [id number]   Primary Cardiologist: Glenetta Hew, MD  Chart reviewed as part of pre-operative protocol coverage. Patient was contacted 12/01/2021 in reference to pre-operative risk assessment for pending surgery as outlined below.  [name] [name] was last seen in Oct 2021 by Dr. Ellyn Hack.  Since that day, [name] [name] has done well. He can complete more than 4.0 METS without angina.   Per our clinical pharmacist: Patient does NOT require pre-op antibiotics for dental procedure.   Per office protocol, patient can hold Eliquis for 2 days prior to procedure.  Therefore, based on ACC/AHA guidelines, the patient would be at acceptable risk for the planned procedure without further cardiovascular testing.   The patient was advised that if he develops new symptoms prior to surgery to contact our office to arrange for a follow-up visit, and he verbalized understanding.  I will route this recommendation to the requesting party via Epic fax function and remove from pre-op pool. Please call with questions.  Tami Lin Lamin Chandley, PA 12/01/2021, 10:40 AM

## 2021-12-02 DIAGNOSIS — K045 Chronic apical periodontitis: Secondary | ICD-10-CM | POA: Diagnosis not present

## 2021-12-02 DIAGNOSIS — K046 Periapical abscess with sinus: Secondary | ICD-10-CM | POA: Diagnosis not present

## 2021-12-02 DIAGNOSIS — T180XXS Foreign body in mouth, sequela: Secondary | ICD-10-CM | POA: Diagnosis not present

## 2021-12-04 DIAGNOSIS — G4733 Obstructive sleep apnea (adult) (pediatric): Secondary | ICD-10-CM | POA: Diagnosis not present

## 2021-12-12 DIAGNOSIS — G4733 Obstructive sleep apnea (adult) (pediatric): Secondary | ICD-10-CM | POA: Diagnosis not present

## 2021-12-18 DIAGNOSIS — L988 Other specified disorders of the skin and subcutaneous tissue: Secondary | ICD-10-CM | POA: Diagnosis not present

## 2021-12-18 DIAGNOSIS — C4359 Malignant melanoma of other part of trunk: Secondary | ICD-10-CM | POA: Diagnosis not present

## 2021-12-29 ENCOUNTER — Emergency Department (HOSPITAL_BASED_OUTPATIENT_CLINIC_OR_DEPARTMENT_OTHER)
Admission: EM | Admit: 2021-12-29 | Discharge: 2021-12-29 | Disposition: A | Discharge status: Home / Self Care | Payer: Medicare Other | Attending: Emergency Medicine | Admitting: Emergency Medicine

## 2021-12-29 ENCOUNTER — Emergency Department (HOSPITAL_BASED_OUTPATIENT_CLINIC_OR_DEPARTMENT_OTHER): Payer: Medicare Other

## 2021-12-29 ENCOUNTER — Other Ambulatory Visit: Payer: Self-pay

## 2021-12-29 ENCOUNTER — Encounter (HOSPITAL_BASED_OUTPATIENT_CLINIC_OR_DEPARTMENT_OTHER): Payer: Self-pay | Admitting: Emergency Medicine

## 2021-12-29 DIAGNOSIS — R109 Unspecified abdominal pain: Secondary | ICD-10-CM | POA: Diagnosis present

## 2021-12-29 DIAGNOSIS — K449 Diaphragmatic hernia without obstruction or gangrene: Secondary | ICD-10-CM | POA: Diagnosis not present

## 2021-12-29 DIAGNOSIS — K5730 Diverticulosis of large intestine without perforation or abscess without bleeding: Secondary | ICD-10-CM | POA: Diagnosis not present

## 2021-12-29 DIAGNOSIS — N200 Calculus of kidney: Secondary | ICD-10-CM | POA: Insufficient documentation

## 2021-12-29 DIAGNOSIS — N202 Calculus of kidney with calculus of ureter: Secondary | ICD-10-CM | POA: Diagnosis not present

## 2021-12-29 HISTORY — DX: Calculus of kidney: N20.0

## 2021-12-29 LAB — URINALYSIS, MICROSCOPIC (REFLEX): RBC / HPF: >50 RBC/hpf (ref 0–5)

## 2021-12-29 LAB — BASIC METABOLIC PANEL
Anion gap: 11 (ref 5–15)
BUN: 24 mg/dL — ABNORMAL HIGH (ref 8–23)
CO2: 22 mmol/L (ref 22–32)
Calcium: 9.2 mg/dL (ref 8.9–10.3)
Chloride: 104 mmol/L (ref 98–111)
Creatinine, Ser: 1.06 mg/dL (ref 0.61–1.24)
GFR, Estimated: >60 mL/min (ref >60)
Glucose, Bld: 95 mg/dL (ref 70–99)
Potassium: 4.1 mmol/L (ref 3.5–5.1)
Sodium: 137 mmol/L (ref 135–145)

## 2021-12-29 LAB — CBC WITH DIFFERENTIAL/PLATELET
Abs Immature Granulocytes: 0.04 10*3/uL (ref 0.00–0.07)
Basophils Absolute: 0 10*3/uL (ref 0.0–0.1)
Basophils Relative: 0 %
Eosinophils Absolute: 0 10*3/uL (ref 0.0–0.5)
Eosinophils Relative: 0 %
HCT: 44.1 % (ref 39.0–52.0)
Hemoglobin: 15.5 g/dL (ref 13.0–17.0)
Immature Granulocytes: 0 %
Lymphocytes Relative: 7 %
Lymphs Abs: 0.8 10*3/uL (ref 0.7–4.0)
MCH: 33 pg (ref 26.0–34.0)
MCHC: 35.1 g/dL (ref 30.0–36.0)
MCV: 93.8 fL (ref 80.0–100.0)
Monocytes Absolute: 0.7 10*3/uL (ref 0.1–1.0)
Monocytes Relative: 7 %
Neutro Abs: 9 10*3/uL — ABNORMAL HIGH (ref 1.7–7.7)
Neutrophils Relative %: 86 %
Platelets: 177 10*3/uL (ref 150–400)
RBC: 4.7 MIL/uL (ref 4.22–5.81)
RDW: 14 % (ref 11.5–15.5)
WBC: 10.6 10*3/uL — ABNORMAL HIGH (ref 4.0–10.5)
nRBC: 0 % (ref 0.0–0.2)

## 2021-12-29 LAB — URINALYSIS, ROUTINE W REFLEX MICROSCOPIC
Bilirubin Urine: NEGATIVE
Glucose, UA: NEGATIVE mg/dL
Ketones, ur: NEGATIVE mg/dL
Nitrite: NEGATIVE
Protein, ur: 30 mg/dL — AB
Specific Gravity, Urine: 1.025 (ref 1.005–1.030)
pH: 5.5 (ref 5.0–8.0)

## 2021-12-29 MED ORDER — HYDROCODONE-ACETAMINOPHEN 5-325 MG PO TABS: 1.0000 | ORAL_TABLET | Freq: Four times a day (QID) | ORAL | 0 refills | Status: DC | PRN

## 2021-12-29 MED ORDER — ONDANSETRON HCL 4 MG PO TABS: 4.0000 mg | ORAL_TABLET | Freq: Four times a day (QID) | ORAL | 0 refills | Status: DC

## 2021-12-29 MED ORDER — TAMSULOSIN HCL 0.4 MG PO CAPS: 0.4000 mg | ORAL_CAPSULE | Freq: Every day | ORAL | 0 refills | Status: DC

## 2021-12-29 MED ORDER — MORPHINE SULFATE (PF) 2 MG/ML IV SOLN
2.0000 mg | Freq: Once | INTRAVENOUS | Status: AC
  Administered 2021-12-29: 2 mg via INTRAVENOUS
  Filled 2021-12-29: qty 1

## 2021-12-29 NOTE — Discharge Instructions (Addendum)
Please use Tylenol or ibuprofen for pain.  You may use 600 mg ibuprofen every 6 hours or 1000 mg of Tylenol every 6 hours.  You may choose to alternate between the 2.  This would be most effective.  Not to exceed 4 g of Tylenol within 24 hours.  Not to exceed 3200 mg ibuprofen 24 hours.  You can use the norco medication in place of tylenol for severe pain. You can use the nausea medication if you experience nausea, vomiting. Please use the flomax medication daily until you pass the stone.

## 2021-12-29 NOTE — ED Triage Notes (Signed)
Pt c/o RT flank pain since 1130 today; hx of kidney stones and sts feels similar

## 2021-12-29 NOTE — ED Provider Notes (Signed)
[facility] Provider Note   CSN: [id number] Arrival date & time: 12/29/21  1215     History  Chief Complaint  Patient presents with   Flank Pain    [name] [name] is a 81 y.o. male with a past medical history significant for A. fib, on chronic anticoagulation with Eliquis, and recurrent kidney stones who presents with acute onset right flank pain since this morning.  Patient denies any nausea, vomiting.  Patient denies any pain with urination, blood in urine.  Patient denies diarrhea, constipation.  Patient denies chest pain, shortness of breath.  He has not had any changes of his medications, and has been taking his Eliquis as prescribed.  Patient has not taken anything for pain prior to arrival.  Patient does have a history of kidney stones, reports that this feels similar.  Patient reports that 1 pass spontaneously, and 1 required surgical removal.  Patient not on chronic Flomax.  Patient reports that he is still in contact with his urologist.   Flank Pain      Home Medications Prior to Admission medications   Medication Sig Start Date End Date Taking? Authorizing Provider  gabapentin (NEURONTIN) 300 MG capsule Take 3 capsules (900 mg total) by mouth 3 (three) times daily. 11/30/21   Mosie Lukes, MD  HYDROcodone-acetaminophen (NORCO/VICODIN) 5-325 MG tablet Take 1-2 tablets by mouth every 6 (six) hours as needed. 12/29/21  Yes Kenneth Lax H, PA-C  ondansetron (ZOFRAN) 4 MG tablet Take 1 tablet (4 mg total) by mouth every 6 (six) hours. 12/29/21  Yes Gussie Towson H, PA-C  tamsulosin (FLOMAX) 0.4 MG CAPS capsule Take 1 capsule (0.4 mg total) by mouth daily. 12/29/21  Yes Danielle Mink H, PA-C  Cholecalciferol (VITAMIN D3) 125 MCG (5000 UT) CAPS Take 1 capsule (5,000 Units total) by mouth daily. Patient taking differently: Take 5,000 Units by mouth daily. 08/27/20   Dennard Nip D, MD  COVID-19 mRNA bivalent vaccine, Pfizer,  injection Inject into the muscle. 09/29/21   Carlyle Basques, MD  ELIQUIS 5 MG TABS tablet Take 1 tablet by mouth twice daily 11/09/21   Leonie Man, MD  Multiple Vitamins-Minerals (MULTIVITAMIN WITH MINERALS) tablet Take 1 tablet by mouth daily.    [provider]  OVER THE COUNTER MEDICATION Apply 1 application topically daily as needed (Pain). Hemp freeze    [provider]  sildenafil (VIAGRA) 100 MG tablet Take 100 mg by mouth daily as needed for erectile dysfunction. 04/23/21   [provider]      Allergies    Patient has no known allergies.    Review of Systems   Review of Systems  Genitourinary:  Positive for flank pain.  All other systems reviewed and are negative.  Physical Exam Updated Vital Signs BP (!) 144/98    Pulse 79    Temp 97.9 F (36.6 C) (Oral)    Resp 14    Ht 5\' 10"  (1.778 m)    Wt 95.3 kg    SpO2 97%    BMI 30.13 kg/m  Physical Exam Vitals and nursing note reviewed.  Constitutional:      General: He is not in acute distress.    Appearance: Normal appearance.  HENT:     Head: Normocephalic and atraumatic.  Eyes:     General:        Right eye: No discharge.        Left eye: No discharge.  Cardiovascular:  Rate and Rhythm: Normal rate and regular rhythm.     Heart sounds: No murmur heard.   No friction rub. No gallop.  Pulmonary:     Effort: Pulmonary effort is normal.     Breath sounds: Normal breath sounds.  Abdominal:     General: Bowel sounds are normal.     Palpations: Abdomen is soft.     Comments: Patient does have right CVA tenderness, is not tender throughout the rest of his abdomen.  He has no rebound, rigidity, guarding.  Skin:    General: Skin is warm and dry.     Capillary Refill: Capillary refill takes less than 2 seconds.  Neurological:     Mental Status: He is alert and oriented to person, place, and time.  Psychiatric:        Mood and Affect: Mood normal.        Behavior: Behavior normal.    ED  Results / Procedures / Treatments   Labs (all labs ordered are listed, but only abnormal results are displayed) Labs Reviewed  URINALYSIS, ROUTINE W REFLEX MICROSCOPIC - Abnormal; Notable for the following components:      Result Value   Hgb urine dipstick LARGE (*)    Protein, ur 30 (*)    Leukocytes,Ua TRACE (*)    All other components within normal limits  CBC WITH DIFFERENTIAL/PLATELET - Abnormal; Notable for the following components:   WBC 10.6 (*)    Neutro Abs 9.0 (*)    All other components within normal limits  BASIC METABOLIC PANEL - Abnormal; Notable for the following components:   BUN 24 (*)    All other components within normal limits  URINALYSIS, MICROSCOPIC (REFLEX) - Abnormal; Notable for the following components:   Bacteria, UA RARE (*)    All other components within normal limits    EKG None  Radiology CT Renal Stone Study  Result Date: 12/29/2021 CLINICAL DATA:  Right flank pain EXAM: CT ABDOMEN AND PELVIS WITHOUT CONTRAST TECHNIQUE: Multidetector CT imaging of the abdomen and pelvis was performed following the standard protocol without IV contrast. COMPARISON:  CT abdomen and pelvis 01/27/2021 FINDINGS: Lower chest: Subsegmental atelectatic changes in lung bases. Hepatobiliary: No focal liver abnormality is seen. No gallstones, gallbladder wall thickening, or biliary dilatation. Pancreas: Unremarkable. No pancreatic ductal dilatation or surrounding inflammatory changes. Spleen: Normal in size without focal abnormality. Adrenals/Urinary Tract: Stable low-density nodularity of the bilateral adrenal glands measuring up to 1.9 cm in size on the left, likely representing adenomas. 4 mm obstructing calculus at the distal right ureter near the UVJ, with mild right hydroureteronephrosis and perinephric fat stranding. A few renal calculi identified on the left measuring up to 10 mm in the upper pole. Multiple parapelvic cysts on the left. Mild urinary bladder wall thickening.  Stomach/Bowel: No bowel obstruction, free air or pneumatosis. Large hiatal hernia. Colonic diverticulosis. Appendix is normal. Vascular/Lymphatic: Aortic atherosclerosis. No enlarged abdominal or pelvic lymph nodes. Reproductive: Prostate gland is markedly enlarged with indentation on the base of the urinary bladder. Other: No ascites. Musculoskeletal: Degenerative changes in the lumbar spine. No suspicious bony lesions identified. IMPRESSION: 1. 4 mm obstructing calculus in the distal right ureter. Mild right hydroureteronephrosis and perinephric fat stranding. 2. Left nephrolithiasis. 3. Colonic diverticulosis. 4. Large hiatal hernia. 5. Marked prostatomegaly with mild wall thickening of the urinary bladder. Electronically Signed   By: Ofilia Neas M.D.   On: 12/29/2021 13:52    Procedures Procedures    Medications Ordered in  ED Medications  morphine 2 MG/ML injection 2 mg (2 mg Intravenous Given 12/29/21 1431)    ED Course/ Medical Decision Making/ A&P                           Medical Decision Making This is an overall well appearing patient who had acute onset right flank pain since 1130 this morning.  Patient reports that he has history of kidney stones and this feels similar.  I independently ordered and reviewed lab work including BMP, CBC, urinalysis, as well as CT renal stone study.  The pertinent findings include a very small elevation of BUN and creatinine from baseline, but does not meet qualifications for acute kidney injury.  Patient with mildly elevated blood cell count of 10.6 with some neutrophil predominance.  His urinalysis is significant for rare bacteria with 0-5 white blood cells, as well as large hemoglobin.  Although there are rare bacteria I do not believe that this represents a true bacterial infection with only trace leukocytes, and 0-5 total cells noted.   Patient is having some cyclic nature of his pain, but reports that he is under better control with morphine 2 mg.   CT renal stone study is significant for a 4 mm obstructing stone in the right ureter.  There is mild hydro utero nephrosis, and perinephric fat stranding.  However as the stone is only 4 mm I suspect that it should pass on its own.  We will discharge patient with Norco, Flomax, instructions to strain all urine, as well as Zofran as needed for nausea and vomiting.  Patient has had no trouble with nausea and vomiting while in the department today.  Instructed to follow-up with urology.  Patient discharged in stable condition at this time, return precautions are given.  Final Clinical Impression(s) / ED Diagnoses Final diagnoses:  Nephrolithiasis    Rx / DC Orders ED Discharge Orders          Ordered    HYDROcodone-acetaminophen (NORCO/VICODIN) 5-325 MG tablet  Every 6 hours PRN        12/29/21 1537    ondansetron (ZOFRAN) 4 MG tablet  Every 6 hours        12/29/21 1537    tamsulosin (FLOMAX) 0.4 MG CAPS capsule  Daily        12/29/21 1537              Ariele Vidrio, [place] H, PA-C 12/29/21 1804    Gareth Morgan, MD 12/29/21 2234

## 2021-12-30 DIAGNOSIS — N200 Calculus of kidney: Secondary | ICD-10-CM | POA: Diagnosis not present

## 2021-12-30 DIAGNOSIS — N201 Calculus of ureter: Secondary | ICD-10-CM | POA: Diagnosis not present

## 2022-01-05 ENCOUNTER — Other Ambulatory Visit: Payer: Self-pay

## 2022-01-05 ENCOUNTER — Ambulatory Visit: Payer: Medicare Other | Admitting: Cardiology

## 2022-01-05 ENCOUNTER — Encounter: Payer: Self-pay | Admitting: Cardiology

## 2022-01-05 VITALS — BP 118/82 | HR 79 | Ht 70.0 in | Wt 224.2 lb

## 2022-01-05 DIAGNOSIS — I4819 Other persistent atrial fibrillation: Secondary | ICD-10-CM

## 2022-01-05 DIAGNOSIS — D6869 Other thrombophilia: Secondary | ICD-10-CM | POA: Diagnosis not present

## 2022-01-05 DIAGNOSIS — E781 Pure hyperglyceridemia: Secondary | ICD-10-CM | POA: Diagnosis not present

## 2022-01-05 DIAGNOSIS — R001 Bradycardia, unspecified: Secondary | ICD-10-CM

## 2022-01-05 DIAGNOSIS — I4821 Permanent atrial fibrillation: Secondary | ICD-10-CM | POA: Diagnosis not present

## 2022-01-05 DIAGNOSIS — G4733 Obstructive sleep apnea (adult) (pediatric): Secondary | ICD-10-CM | POA: Diagnosis not present

## 2022-01-05 DIAGNOSIS — Z9989 Dependence on other enabling machines and devices: Secondary | ICD-10-CM

## 2022-01-05 NOTE — Patient Instructions (Addendum)

## 2022-01-05 NOTE — Progress Notes (Signed)
Primary Care Provider: Mosie Lukes, MD Cardiologist: Glenetta Hew, MD Electrophysiologist: None  Clinic Note: Chief Complaint  Patient presents with   Follow-up    Routine annual follow-up.  No major issues.   Atrial Fibrillation    Pretty much asymptomatic.  May be old it still easier than usual fatigue.   ===================================  ASSESSMENT/PLAN   Problem List Items Addressed This Visit       Cardiology Problems   Permanent atrial fibrillation (HCC) - Primary (Chronic)    Pretty much asymptomatic.  Maybe a little bit of exercise intolerance but currently not worried.  Rate well controlled without any medications. No major bleeding issues with Eliquis.  But he will need to hold Eliquis for his lithotripsy planned.      Relevant Orders   EKG 12-Lead (Completed)   Hypertriglyceridemia (Chronic)    Most recent labs had excellent triglycerides.      Hypercoagulable state due to permanent atrial fibrillation (HCC) (Chronic)    Essentially permanent A-fib.  On Eliquis for anticoagulation.  No major bleeding issues except for when he has nephrolithiasis.  Okay to hold Eliquis 2 to 3 days preop for surgeries or procedures.  Restart when safe postop.        Other   OSA on CPAP (Chronic)    Tolerating well.  Stressed the importance of continued use to avoid adverse effect of OSA.      Bradycardia (Chronic)    No longer bradycardic, no longer on AV nodal agents. Signs of chronotropic comments.      Relevant Orders   EKG 12-Lead (Completed)    ===================================  HPI:    [name] [name] "[name] [name]" is a 81 y.o. male with Permanent AFib who presents today for ~ annual f/u.  [name] [name] was last seen on 10/09/2020 accompanied by his daughter [name] [name], [name] [name] (one of our clinical pharmacist).  Pretty asymptomatic from A-fib.  EKG showed bradycardic A-fib with no symptoms.  Very active doing yard work, Psychologist, educational.   Try to be careful to avoid bleeding or bruising with Eliquis.  Had some right shoulder pain => provided preop risk assessment for his shoulder surgery.  Recent Hospitalizations:  December 29, 2020-[facility] ER.  Presented with hematuria with flank pain.  Had right CVA tenderness  Reviewed  CV studies:    The following studies were reviewed today: (if available, images/films reviewed: From Epic Chart or Care Everywhere) None:   Interval History:   [name] [name] returns today for routine follow-up doing pretty well.  He has some swelling issues after his shoulder surgery and wears a sleeve for lymphedema.  He is still very active has not really been having to hold him up.  He gets a little bit fatigued in the relief of the day and has to take couple naps otherwise no chest pain or pressure with rest or exertion.  No sensation of being in A-fib.  No tachycardia spells. He did have some hematuria when he had his recent ER visit for kidney stones and has had a little fleeting episodes since then.  Nothing overly dramatic.  Otherwise totally stable from cardiac standpoint.  CV Review of Symptoms (Summary) Cardiovascular ROS: no chest pain or dyspnea on exertion positive for - -has to take a few more breaks than usual with doing his chores.  Has to take a few naps during the day. ->  Stable swelling.  Being followed for varicose vein  by Dr. Trula Slade negative for - irregular heartbeat, orthopnea, palpitations, paroxysmal nocturnal dyspnea, rapid heart rate, shortness of breath, or syncope/near syncope or TIA/amaurosis fugax, claudication  REVIEWED OF SYSTEMS   Review of Systems  Constitutional:  Negative for malaise/fatigue and weight loss.  HENT:  Negative for congestion and nosebleeds.   Respiratory:  Negative for cough and shortness of breath.   Cardiovascular:        Per HPI  Gastrointestinal:  Negative for blood in stool and melena.  Genitourinary:  Positive for  flank pain (Not since his ER visit for nephrolithiasis.) and hematuria (With nephrolithiasis.).  Musculoskeletal:  Positive for joint pain (Right shoulder still somewhat sore.  Still has lymphedema, wears compression sleeve). Negative for falls.  Neurological:  Negative for dizziness, focal weakness, weakness and headaches.  Psychiatric/Behavioral:  Negative for depression and memory loss. The patient is not nervous/anxious.    I have reviewed and (if needed) personally updated the patient's problem list, medications, allergies, past medical and surgical history, social and family history.   PAST MEDICAL HISTORY   Past Medical History:  Diagnosis Date   Atrial fibrillation (HCC)    BPH (benign prostatic hypertrophy)    Bradycardia 12/30/2014   HR routinely in mid 40s-50s   Coronary artery disease (CAD) excluded 03/2015   False-positive nuclear stress test suggesting inferior ischemia   Dysrhythmia    Elevated PSA    Encounter for Medicare annual wellness exam 09/22/2013   Sees Dr Wilhemina Bonito of Derm Sees Dr Silvano Rusk of Gastroenterology Sees Dr Kathie Rhodes of [facility] Sees Dr Susa Day of Optometry   GERD (gastroesophageal reflux disease)    controlled w/ diet and behavioral changes, history of   Grade I diastolic dysfunction 9563   History of kidney stones    Hypertriglyceridemia 03/16/2015   Kidney stones    Lymphadenitis    Melanoma of back ([place])    Excised Dr Wilhemina Bonito   Mild ascending aorta dilatation ([place])    New onset a-fib ([place]) 03/10/2015   OA (osteoarthritis)    Otitis, externa, infective 08/10/2015   Palpitations    Pneumonia 2004   Post herpetic neuralgia    Shingles 07/09/2013   Sleep apnea 07/30/2011   cpap- 13    Snoring disorder 07/30/2011    PAST SURGICAL HISTORY   Past Surgical History:  Procedure Laterality Date   CATARACT EXTRACTION Right    COLONOSCOPY  2018   CYSTOSCOPY/URETEROSCOPY/HOLMIUM LASER/STENT PLACEMENT Left 10/05/2019    Procedure: CYSTOSCOPY/RETROGRADE/URETEROSCOPY/HOLMIUM LASER/STENT PLACEMENT;  Surgeon: Kathie Rhodes, MD;  Location: [facility];  Service: Urology;  Laterality: Left;   EXTRACORPOREAL SHOCK WAVE LITHOTRIPSY Left 01/25/2022   Procedure: EXTRACORPOREAL SHOCK WAVE LITHOTRIPSY (ESWL);  Surgeon: Raynelle Bring, MD;  Location: [facility];  Service: Urology;  Laterality: Left;   EYE SURGERY Left 12/06/2017   cataract by Dr Logan Bores HERNIA REPAIR Bilateral 06/01/2019   Procedure: LAPAROSCOPIC LEFT AND  RIGHT INGUINAL HERNIA REPAIR WITH MESH;  Surgeon: Michael Boston, MD;  Location: [facility];  Service: General;  Laterality: Bilateral;   LEFT HEART CATHETERIZATION WITH CORONARY ANGIOGRAM N/A 04/10/2015   Procedure: LEFT HEART CATHETERIZATION WITH  CORONARY ANGIOGRAM;  Surgeon: Leonie Man, MD;  Location: [facility] CATH LAB;  Service: Cardiovascular;  Angiographicallly NORMAL CORONARY ARTERIES   MASS EXCISION N/A 05/16/2014   Procedure: EXCISION POSTERIOR NECK MASS, RIGHT CHEST WALL MASS AND RIGHT AXILLARY MASS AXILLARY NODE DISSECTION;  Surgeon: Adin Hector, MD;  Location: [facility];  Service: General;  Laterality: N/A;   melanoma removal     MOUTH SURGERY N/A    NM MYOVIEW LTD  03/19/2015   FALSE POSITIVE:  INTERMEDIATE RISK. Small sized, moderate intensity inferior ischemic perfusion defect   REVERSE SHOULDER ARTHROPLASTY Left 10/16/2020   Procedure: REVERSE SHOULDER ARTHROPLASTY;  Surgeon: Justice Britain, MD;  Location: [facility];  Service: Orthopedics;  Laterality: Left;  167min   TRANSTHORACIC ECHOCARDIOGRAM  03/20/2015   Normal LV size and function. EF 60-65%. G1 DD. Trivial AI. Borderline aortic root dilation (41 mm), Mild LA dilation.   VEIN LIGATION AND STRIPPING Left 07/13/2021   Procedure: LIGATION AND STRIPPING OF LEFT GREAT SAPHENOUS VEIN WITH STAB PHLEBECTOMY GREATER THAN 20 INCISIONS TO LEFT LEG;  Surgeon: Angelia Mould, MD;   Location: [facility];  Service: Vascular;  Laterality: Left;    Immunization History  Administered Date(s) Administered   Fluad Quad(high Dose 65+) 10/11/2019, 09/11/2020   Influenza Whole 09/15/2009, 09/23/2010   Influenza, High Dose Seasonal PF 09/21/2016, 11/07/2017, 10/06/2018   Influenza,inj,Quad PF,6+ Mos 09/20/2013, 10/18/2014, 09/08/2015   PFIZER Comirnaty(Gray Top)Covid-19 Tri-Sucrose Vaccine 05/18/2021   PFIZER(Purple Top)SARS-COV-2 Vaccination 01/11/2020, 02/01/2020, 10/03/2020   Pfizer Covid-19 Vaccine Bivalent Booster 66yrs & up 09/29/2021   Pneumococcal Conjugate-13 11/19/2013   Pneumococcal Polysaccharide-23 04/09/2003, 10/31/2015   Td 06/04/2010   Zoster Recombinat (Shingrix) 09/30/2017, 12/08/2017    MEDICATIONS/ALLERGIES   Current Meds  Medication Sig   Cholecalciferol (VITAMIN D3) 125 MCG (5000 UT) CAPS Take 1 capsule (5,000 Units total) by mouth daily. (Patient taking differently: Take 5,000 Units by mouth daily.)   gabapentin (NEURONTIN) 300 MG capsule Take 3 capsules (900 mg total) by mouth 3 (three) times daily.   OVER THE COUNTER MEDICATION Apply 1 application topically daily as needed (Pain). Hemp freeze   sildenafil (VIAGRA) 100 MG tablet Take 100 mg by mouth daily as needed for erectile dysfunction.   [DISCONTINUED] COVID-19 mRNA bivalent vaccine, Pfizer, injection Inject into the muscle.   [DISCONTINUED] ELIQUIS 5 MG TABS tablet Take 1 tablet by mouth twice daily   [DISCONTINUED] Multiple Vitamins-Minerals (MULTIVITAMIN WITH MINERALS) tablet Take 1 tablet by mouth daily.   [DISCONTINUED] tamsulosin (FLOMAX) 0.4 MG CAPS capsule Take 1 capsule (0.4 mg total) by mouth daily.    No Known Allergies  SOCIAL HISTORY/FAMILY HISTORY   Reviewed in Epic:  Pertinent findings:  Social History   Tobacco Use   Smoking status: Former    Types: Pipe, Cigars    Quit date: 02/11/1989    Years since quitting: 33.0   Smokeless tobacco: Never  Vaping Use   Vaping Use:  Never used  Substance Use Topics   Alcohol use: Yes    Alcohol/week: 7.0 standard drinks    Types: 7 Glasses of wine per week    Comment: 1 per day -- brandy or wine   Drug use: No   Social History   Social History Narrative   He is a married father of 2, and grandfather of 71. He has been married to his wife [name] for 51 years. He previously lived in [place] but has moved with his wife to [place], [place] to be close to his daughter [name] and her family. [name] is our Nurse, adult.   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 He quit smoking in 1990. He has 6-7 glasses of brandy or wine a week.    He exercises regularly with low impact  aerobics 2 days a week and daily walks of 30-45 minutes. His exercise sessions or 60 minutes. He will do some type of exercise at least 7 days a week and is very active doing yard work and wood work.      Patient is right-handed. He lives with his wife in a one level home with a basement. He drinks 4-5 cups of decaf coffee a day. He and his wife go to the gym 1-2 x a week.    OBJCTIVE -PE, EKG, labs   Wt Readings from Last 3 Encounters:  01/05/22 224 lb 3.2 oz; BMI 32.17   Physical Exam: BP 118/82    Pulse 79    Ht 5\' 10"  (1.778 m)    Wt 224 lb 3.2 oz (101.7 kg)    SpO2 100%    BMI 32.17 kg/m  Physical Exam Vitals reviewed.  Constitutional:      Appearance: Normal appearance. He is obese. He is not ill-appearing or toxic-appearing.     Comments: Well-nourished, well-groomed.  Healthy-appearing.  HENT:     Head: Normocephalic and atraumatic.  Neck:     Vascular: No carotid bruit, hepatojugular reflux or JVD.  Cardiovascular:     Rate and Rhythm: Normal rate. Rhythm regularly irregular.     Chest Wall: PMI is not displaced.     Pulses: Normal pulses.     Heart sounds: Normal heart sounds, S1 normal and S2 normal. No murmur heard.   No friction rub. No gallop. No S4 sounds.  Pulmonary:      Effort: Pulmonary effort is normal. No respiratory distress.     Breath sounds: Normal breath sounds. No stridor. No wheezing, rhonchi or rales.  Musculoskeletal:        General: Swelling (He does have some swelling of the right upper extremity from lymphedema.  Support sleeve in place.) present. Normal range of motion.     Cervical back: Normal range of motion.  Skin:    General: Skin is warm and dry.  Neurological:     General: No focal deficit present.     Mental Status: He is alert and oriented to person, place, and time.     Gait: Gait normal.  Psychiatric:        Mood and Affect: Mood normal.        Behavior: Behavior normal.        Thought Content: Thought content normal.        Judgment: Judgment normal.    Adult ECG Report  Rate: 79;  Rhythm: atrial fibrillation; cannot rule out anterior Mikan age-indeterminate.  Otherwise normal axis, intervals durations.  Narrative Interpretation: Stable  Recent Labs: Reviewed Lab Results  Component Value Date   CHOL 174 11/30/2021   HDL 62 11/30/2021   LDLCALC 99 11/30/2021   TRIG 68 11/30/2021   CHOLHDL 3 10/11/2019   Lab Results  Component Value Date   CREATININE 1.06 12/29/2021   BUN 24 (H) 12/29/2021   NA 137 12/29/2021   K 4.1 12/29/2021   CL 104 12/29/2021   CO2 22 12/29/2021   CBC Latest Ref Rng & Units 12/29/2021 07/13/2021 10/10/2020  WBC 4.0 - 10.5 K/uL 10.6(H) - 6.4  Hemoglobin 13.0 - 17.0 g/dL 15.5 14.6 14.4  Hematocrit 39.0 - 52.0 % 44.1 43.0 42.6  Platelets 150 - 400 K/uL 177 - 172    Lab Results  Component Value Date   HGBA1C 6.0 (H) 11/30/2021   Lab Results  Component Value Date  TSH 2.840 11/30/2021    ==================================================  COVID-19 Education: The signs and symptoms of COVID-19 were discussed with the patient and how to seek care for testing (follow up with PCP or arrange E-visit).    I spent a total of 21 minutes with the patient spent in direct patient consultation.   Additional time spent with chart review  / charting (studies, outside notes, etc): 12 min Total Time: 33 min  Current medicines are reviewed at length with the patient today.  (+/- concerns)  None  This visit occurred during the SARS-CoV-2 public health emergency.  Safety protocols were in place, including screening questions prior to the visit, additional usage of staff PPE, and extensive cleaning of exam room while observing appropriate contact time as indicated for disinfecting solutions.  Notice: This dictation was prepared with Dragon dictation along with smart phrase technology. Any transcriptional errors that result from this process are unintentional and may not be corrected upon review.  Studies Ordered:   Orders Placed This Encounter  Procedures   EKG 12-Lead    Patient Instructions / Medication Changes & Studies & Tests Ordered   Patient Instructions  Medication Instructions:  No changes  *If you need a refill on your cardiac medications before your next appointment, please call your pharmacy*   Lab Work:  Not needed    Testing/Procedures:  Not needed  Follow-Up: At [facility], you and your health needs are our priority.  As part of our continuing mission to provide you with exceptional heart care, we have created designated Provider Care Teams.  These Care Teams include your primary Cardiologist (physician) and Advanced Practice Providers (APPs -  Physician Assistants and Nurse Practitioners) who all work together to provide you with the care you need, when you need it.     Your next appointment:   12 month(s)  The format for your next appointment:   In Person  Provider:   Glenetta Hew, MD     Signed,  Glenetta Hew, MD   Glenetta Hew, M.D., M.S. Interventional Cardiologist   Pager # [phone] Phone # [phone] [address] [place], [place] [address]   Thank you for choosing Heartcare at [facility]!!

## 2022-01-08 DIAGNOSIS — N200 Calculus of kidney: Secondary | ICD-10-CM | POA: Diagnosis not present

## 2022-01-11 ENCOUNTER — Telehealth: Payer: Self-pay | Admitting: Cardiology

## 2022-01-11 NOTE — Telephone Encounter (Signed)
° °  [facility] Pre-operative Risk Assessment    Request for surgical clearance:  What type of surgery is being performed?  Extracorporeal Shock Wave Lithotripsy   When is this surgery scheduled?  TBD   What type of clearance is required (medical clearance vs. Pharmacy clearance to hold med vs. Both)?  Both   Are there any medications that need to be held prior to surgery and how long? Eliquis, 48 hours prior   Practice name and name of physician performing surgery?  [facility]   Dr. Rexene Alberts   What is your office phone number? [phone] (ext: [phone])    7.   What is your office fax number? [phone]  8.   Anesthesia type (None, local, MAC, general) ?  Local    [name] [name] 01/11/2022, 2:38 PM

## 2022-01-12 DIAGNOSIS — G4733 Obstructive sleep apnea (adult) (pediatric): Secondary | ICD-10-CM | POA: Diagnosis not present

## 2022-01-12 NOTE — Telephone Encounter (Signed)
Patient with diagnosis of afib on Eliquis for anticoagulation.    Procedure: Extracorporeal Shock Wave Lithotripsy  Date of procedure: TBD  CHA2DS2-VASc Score = 2  This indicates a 2.2% annual risk of stroke. The patient's score is based upon: CHF History: 0 HTN History: 0 Diabetes History: 0 Stroke History: 0 Vascular Disease History: 0 Age Score: 2 Gender Score: 0  CrCl 60mL/min using adjusted body weight Platelet count 177K  Per office protocol, patient can hold Eliquis for 2 days prior to procedure as requested.

## 2022-01-14 NOTE — Telephone Encounter (Signed)
Patient was seen by Dr. Ellyn Hack last week.  Waiting on completion of office note.  Dr. Ellyn Hack, is the patient cleared for lithotripsy from your perspective?

## 2022-01-15 NOTE — Telephone Encounter (Signed)
Yes he is cleared for lithotripsy.  No need to bridge Eliquis.   Was not aware that he needed lithotripsy.  Glenetta Hew, MD

## 2022-01-18 ENCOUNTER — Other Ambulatory Visit: Payer: Self-pay | Admitting: Urology

## 2022-01-21 NOTE — Progress Notes (Signed)
01/21/2022 3:50 PM Pre procedure call completed. Pt. Updated on date and time of arrival 01/25/22 at 0800. Times for NPO MN and clear liquid consumption until 0600 reviewed. Pt. Allergies, medical hx and medication list reviewed. Medications ok to take day of surgery and those to hold prior to surgery reviewed. Holding Eliquis and Multivitamins as of Friday 01/22/22. Ride secured for day of surgery (wife). Cardiac Clearance noted on chart by Dr. Ellyn Hack. Questions and concerns addressed by patient. Pt. Verbalized understanding of all instructions.   [name] [name], [name]

## 2022-01-22 NOTE — H&P (Signed)
Office Visit Report     01/08/2022   --------------------------------------------------------------------------------   [name] A. Derusha  MRN: [id number]  DOB: 09-17-1941, 81 year old Male  SSN: [id number]   PRIMARY CARE:  [name], MD  REFERRING:  Daine Gravel, NP  PROVIDER:  Rexene Alberts, M.D.  TREATING:  Daine Gravel, NP  LOCATION:  [facility] [phone]     --------------------------------------------------------------------------------   CC/HPI: [name] [name] is a 81 year old male seen in follow-up with bilateral renal stones, right renal cyst, BPH with LUTS, elevated PSA, and erectile dysfunction.   He has a history of urolithiasis with prior history of ureteroscopy with laser lithotripsy on 01/04/2019. He also has a history of medical expulsive therapy. His prior stone analyses have resulted calcium oxalate 95% and calcium phosphate 5%. Most recent CT A/P 01/27/2021 revealed a 2 mm right lower pole stone and a 6 mm left interpolar stone. He is asymptomatic from the stones. He desires to continued surveillance. He will consider therapy if he becomes symptomatic.   A simple right renal cyst was noted in the right kidney on CT A/P in 05/2016. CT A/P 01/27/2021 revealed left renal sinus cysts are stable as well as a few tiny subcapsular cyst in the right kidney. There were no complex cystic or solid renal masses identified.   He does have some lower urinary tract symptoms including a sensation of incomplete emptying, frequency, intermittency, urgency, weak flow stream, straining to void and 3 time nocturia. His IPSS score is 16, QOL 3. He denies taking an alpha-blocker. He has stable lower urinary tract symptoms and does not desire therapy at this time.   He has a history of an elevated PSA. PSA in 10/2003 was 3.1. He underwent TRUS biopsy that revealed benign pathology. Since then, his PSAs have fluctuated over time. His last PSA on 11/26/2020 was 5.6. He denies bone pain  or unexpected weight loss. He has stable appetite. He does not wish to proceed with TRUS biopsy at this time.   He also tells me that he has a history of erectile dysfunction with difficulty obtaining and maintaining erection. He has never tried PDE 5 inhibitors in the past. He denies taking nitrate therapy.   He does take Eliquis.   Patient currently denies fever, chills, sweats, nausea, vomiting, abdominal or flank pain, gross hematuria or dysuria.   12/30/2021: 81 year old male who presents today due to concerns of a distal right ureteral stone measuring approximately 4 to 5 mm. This was found 3 days ago in the emergency department. He was placed on tamsulosin as well as pain and nausea medication. He has been straining his urine. He has not seen a stone pass. He also has a 10 mm left renal stone. Currently denies fevers and chills. He reports his pain is controlled although, his right flank is sore and tender.   01/08/2022: 81 year old male who presents today for follow-up regarding a right distal 4 mm stone. He has not seen a stone pass in his urine however reports that all of his symptoms have resolved. He denies interval gross hematuria and fevers and chills. He also has a left-sided renal stone that he is interested in getting ESWL performed on. He does take a blood thinning medication. He understands that he will need to be off that medication for lithotripsy.     ALLERGIES: No Allergies    MEDICATIONS: Eliquis  Gabapentin 300 mg capsule Oral  Multiple Vitamin TABS Oral  Omega 3 1200 MG  Oral Capsule Oral     GU PSH: Cysto Fulgurate < 0.5 cm - 10/05/2019 Locm 300-399Mg /Ml Iodine,1Ml - 01/27/2021, 07/19/2019 Prostate Needle Biopsy - 2010 Ureteroscopic laser litho, Left - 10/05/2019 Vasectomy - 2010       PSH Notes: Surgery Of Male Genitalia Vasectomy, Biopsy Of The Prostate Needle   NON-GU PSH: None   GU PMH: Renal calculus, Bilateral - 12/30/2021, Bilateral, - 01/27/2021, Bilateral, -  12/30/2020, Bilateral, He has bilateral renal calculi that are nonobstructing. The stones on the right-hand side a punctate., - 08/21/2019 Ureteral calculus - 12/30/2021, - 10/12/2019, - 10/08/2019 (Stable), Left, His stone is fairly faint. We therefore discussed treating it with ureteroscopy., - 09/18/2019, Left, I cannot see his left ureteral stone on his KUB today but he has not seen a stone pass. I am going to empirically begin an alpha-blocker and then have him return in 4 weeks for repeat KUB., - 08/21/2019 BPH w/LUTS - 02/10/2021, - 12/30/2020, Benign prostatic hyperplasia with urinary obstruction, - 2017 ED due to arterial insufficiency - 02/10/2021 Elevated PSA - 02/10/2021, (Stable), His prostate is smooth and benign. His PSA remains stable with a good free to total ratio so I will continue to monitor this on a yearly basis., - 08/21/2019 (Stable), Noted his prostate was benign on his exam and his PSA remains stable with an excellent free to total ratio. I will plan to see him back again on a yearly basis, - 2019 (Stable), He has a history of an elevated PSA. I will recheck that when he returns in 6 months., - 2017, Elevated prostate specific antigen (PSA), - 2017 History of urolithiasis - 02/10/2021, - 12/30/2020 (Stable), He has a history of stones but has no evidence of recurrent calculus disease at this time. I will continue imaging on a p.r.n. basis from here out., - 2018, Left, He has a left renal calculus and I am going to reimage this again in 6 months with a KUB., - 2017 Renal cyst (Stable) - 02/10/2021, Right, - 01/27/2021, Right, - 12/30/2020 (Stable), Right, his cyst appears benign., - 2017 Urinary Frequency - 02/10/2021 Gross hematuria - 07/13/2019 Lower abdominal pain, unspecified (Acute), Left, He had no hernia that I could feel today on exam. That being said his history is pretty suggestive of a hernia. I told him that I felt it would be a good idea to obtain an opinion from a Education officer, environmental. I do not  feel that his pain is primarily from his spermatic cord. - 2019 Post-void dribbling (Stable), He has some mild postvoid dribbling. We discussed how to strip the urethra to help with this. - 2019 Renal and ureteral calculus (Stable, Acute), Left, he has a small stone in the left ureter which has a good chance of spontaneous passage. I have placed him on medical expulsive therapy - 2017 Nocturia, Nocturia - 2016      PMH Notes: Calculus disease: On 06/19/16 a CT scan obtained and was told he had a 5 mm left ureteral stone. He passed the stone.  Stone analysis: Calcium oxalate 95% and calcium phosphate 5%  Left ureteroscopy and laser lithotripsy 10/05/19.   Right renal cyst: A simple cyst was noted in the right kidney by CT scan in 6/17.   BPH with outlet obstruction: He was under the care of urologist in [place] for a number of years before moving to [place]. He underwent several uroflow studies and was noted to have a maximum flow of 9 mL/second with a mean flow  of 4 mL/second and 312 cc voided at the time of the study done in 11/09. He had been on Uroxatral for that but found he urinated better off of the medication.   Elevated PSA: His PSA was noted to be 3.1 in 11/04 and he underwent a TRUS/Bx that revealed benign pathology. Since then his PSA has fluctuated over time and in 11/06 was 4.0 but then fell to 3.2 the following year. in 7/10 it was noted to be as high as 7.47 again returning to just above for on follow-up. This pattern has repeated several times indicating prostate cancer as an unlikely cause.   Prostate lesion: He was incidentally found to have a papillary lesion within the prostatic urethra at the time of ureteroscopy on 10/05/19. It was biopsied.  Pathology: Benign prostatic urethral polyp.     NON-GU PMH: Encounter for general adult medical examination without abnormal findings, Encounter for preventive health examination - Mar 23, 2015 Personal history of other infectious and  parasitic diseases, History of shingles - 2014/03/22    FAMILY HISTORY: Death In The Family Father - [place] Neighbors In Family Death In The Family Mother - Runs In Family Emphysema - Runs In Family Family Health Status Number - Runs In Family leukemia - Runs In Family   SOCIAL HISTORY: Marital Status: Married Preferred Language: English; Ethnicity: Not Hispanic Or Latino; Race: White Current Smoking Status: Patient does not smoke anymore.  Does not use smokeless tobacco. Social Drinker.  Does not use drugs. Drinks 2 caffeinated drinks per day. Has not had a blood transfusion.     Notes: Former smoker, Alcohol Use, Caffeine Use, Marital History - Currently Married, Tobacco Use, Occupation:   REVIEW OF SYSTEMS:    GU Review Male:   Patient denies frequent urination, hard to postpone urination, burning/ pain with urination, get up at night to urinate, leakage of urine, stream starts and stops, trouble starting your stream, have to strain to urinate , erection problems, and penile pain.  Gastrointestinal (Upper):   Patient denies nausea, vomiting, and indigestion/ heartburn.  Gastrointestinal (Lower):   Patient denies diarrhea and constipation.  Constitutional:   Patient denies fever, night sweats, weight loss, and fatigue.  Musculoskeletal:   Patient denies back pain and joint pain.  Neurological:   Patient denies headaches and dizziness.  Psychologic:   Patient denies depression and anxiety.   VITAL SIGNS:      01/08/2022 11:27 AM  BP 130/86 mmHg  Pulse 71 /min  Temperature 98.0 F / 36.6 C   GU PHYSICAL EXAMINATION:      Notes: No CVA tenderness.   MULTI-SYSTEM PHYSICAL EXAMINATION:    Constitutional: Well-nourished. No physical deformities. Normally developed. Good grooming.  Respiratory: No labored breathing, no use of accessory muscles.   Cardiovascular: Normal temperature, normal extremity pulses, no swelling, no varicosities.  Skin: No paleness, no jaundice, no cyanosis. No lesion, no  ulcer, no rash.  Neurologic / Psychiatric: Oriented to time, oriented to place, oriented to person. No depression, no anxiety, no agitation.     Complexity of Data:  Source Of History:  Patient  Records Review:   Previous Doctor Records, Previous Patient Records  Urine Test Review:   Urinalysis  X-Ray Review: KUB: Reviewed Films. Reviewed Report. Discussed With Patient.  Renal Ultrasound (Limited): Reviewed Films. Reviewed Report.     11/26/20 08/15/19 08/07/18 07/28/17 04/15/16 04/19/15 04/17/14 04/05/13  PSA  Total PSA 5.64 ng/mL 4.11 ng/mL 4.13 ng/mL 3.80 ng/mL 4.06  4.72  4.55  4.67  Free PSA 1.25 ng/mL 0.96 ng/mL 1.13 ng/mL  0.92  0.94  1.00  0.84   % Free PSA 22 % PSA 23 % PSA 27 % PSA  23  20  22  18      01/08/22  Urinalysis  Urine Appearance Clear   Urine Color Yellow   Urine Glucose Neg mg/dL  Urine Bilirubin Neg mg/dL  Urine Ketones Neg mg/dL  Urine Specific Gravity 1.025   Urine Blood Neg ery/uL  Urine pH <=5.0   Urine Protein Neg mg/dL  Urine Urobilinogen 0.2 mg/dL  Urine Nitrites Neg   Urine Leukocyte Esterase Neg leu/uL   PROCEDURES:         Renal Ultrasound (Limited) - 85027  Kidney: Right Length: 11.6 cm Depth: 6.9 cm Cortical Width: 1.9 cm Width: 5.2 cm    Right Kidney/Ureter:  Cyst LP. Echogenic mass UP c/w AML  Bladder:  PVR 153.3 ml      Patient confirmed No Neulasta OnPro Device.            KUB - [id number]  A single view of the abdomen is obtained. Overlying bowel gas pattern.  Calculi:  Lower pole left kidney calculi 7-55mm      Patient confirmed No Neulasta OnPro Device.           Urinalysis Dipstick Dipstick Cont'd  Color: Yellow Bilirubin: Neg mg/dL  Appearance: Clear Ketones: Neg mg/dL  Specific Gravity: 1.025 Blood: Neg ery/uL  pH: <=5.0 Protein: Neg mg/dL  Glucose: Neg mg/dL Urobilinogen: 0.2 mg/dL    Nitrites: Neg    Leukocyte Esterase: Neg leu/uL    ASSESSMENT:      ICD-10 Details  1 GU:   Renal calculus - N20.0 Left,  Chronic, Stable   PLAN:           Orders X-Rays: KUB    Renal Ultrasound (Limited)  X-Ray Notes: History:  Hematuria: Yes/No  Patient to see MD after exam: Yes/No  Previous exam: CT / IVP/ US/ KUB/ None  When:  Where:  Diabetic: Yes/ No  BUN/ Creatinine:  Date of last BUN Creatinine:  Weight in pounds:  Allergy- IV Contrast: Yes/ No  Conflicting diabetic meds: Yes/ No  Diabetic Meds:  Prior Authorization #: [id number]            Schedule Return Visit/Planned Activity: Next Available Appointment - Schedule Surgery          Document Letter(s):  Created for Patient: Clinical Summary         Notes:   Renal ultrasound and KUB her not concerning for obstructing stone today. He would like to get lithotripsy done on his left sided renal stone. A posting sheet was placed for this today. He understands the risks of lithotripsy as well as the need to be off his Xarelto for a period of time prior to undergoing lithotripsy.         Next Appointment:      Next Appointment: 02/04/2022 11:30 AM    Appointment Type: Laboratory Appointment    Location: [facility] [phone]    Provider: Lab LAB    Reason for Visit: psa      * Signed by Daine Gravel, NP on 01/08/22 at 5:27 PM (EST)*

## 2022-01-25 ENCOUNTER — Other Ambulatory Visit: Payer: Self-pay

## 2022-01-25 ENCOUNTER — Encounter (HOSPITAL_BASED_OUTPATIENT_CLINIC_OR_DEPARTMENT_OTHER): Payer: Self-pay | Admitting: Urology

## 2022-01-25 ENCOUNTER — Ambulatory Visit (HOSPITAL_BASED_OUTPATIENT_CLINIC_OR_DEPARTMENT_OTHER)
Admission: RE | Admit: 2022-01-25 | Discharge: 2022-01-25 | Disposition: A | Discharge status: Home / Self Care | Payer: Medicare Other | Attending: Urology | Admitting: Urology

## 2022-01-25 ENCOUNTER — Encounter (HOSPITAL_BASED_OUTPATIENT_CLINIC_OR_DEPARTMENT_OTHER)
Admission: RE | Disposition: A | Discharge status: Home / Self Care | Payer: Self-pay | Source: Home / Self Care | Attending: Urology

## 2022-01-25 ENCOUNTER — Ambulatory Visit (HOSPITAL_COMMUNITY): Payer: Medicare Other

## 2022-01-25 DIAGNOSIS — N200 Calculus of kidney: Secondary | ICD-10-CM | POA: Insufficient documentation

## 2022-01-25 DIAGNOSIS — K219 Gastro-esophageal reflux disease without esophagitis: Secondary | ICD-10-CM | POA: Diagnosis not present

## 2022-01-25 DIAGNOSIS — Z7901 Long term (current) use of anticoagulants: Secondary | ICD-10-CM | POA: Insufficient documentation

## 2022-01-25 DIAGNOSIS — I2510 Atherosclerotic heart disease of native coronary artery without angina pectoris: Secondary | ICD-10-CM | POA: Insufficient documentation

## 2022-01-25 DIAGNOSIS — G4730 Sleep apnea, unspecified: Secondary | ICD-10-CM | POA: Insufficient documentation

## 2022-01-25 DIAGNOSIS — Z01818 Encounter for other preprocedural examination: Secondary | ICD-10-CM | POA: Diagnosis not present

## 2022-01-25 DIAGNOSIS — I4891 Unspecified atrial fibrillation: Secondary | ICD-10-CM | POA: Diagnosis not present

## 2022-01-25 DIAGNOSIS — I890 Lymphedema, not elsewhere classified: Secondary | ICD-10-CM | POA: Diagnosis not present

## 2022-01-25 DIAGNOSIS — Z87442 Personal history of urinary calculi: Secondary | ICD-10-CM | POA: Insufficient documentation

## 2022-01-25 HISTORY — PX: EXTRACORPOREAL SHOCK WAVE LITHOTRIPSY: SHX1557

## 2022-01-25 SURGERY — LITHOTRIPSY, ESWL
Anesthesia: LOCAL | Laterality: Left

## 2022-01-25 MED ORDER — TAMSULOSIN HCL 0.4 MG PO CAPS
0.4000 mg | ORAL_CAPSULE | Freq: Every day | ORAL | 0 refills | Status: AC
Start: 2022-01-25 — End: ?

## 2022-01-25 MED ORDER — DIAZEPAM 5 MG PO TABS
ORAL_TABLET | ORAL | Status: AC
  Filled 2022-01-25: qty 2

## 2022-01-25 MED ORDER — SODIUM CHLORIDE 0.9 % IV SOLN: INTRAVENOUS | Status: DC

## 2022-01-25 MED ORDER — DIAZEPAM 5 MG PO TABS
10.0000 mg | ORAL_TABLET | ORAL | Status: AC
  Administered 2022-01-25: 10 mg via ORAL

## 2022-01-25 MED ORDER — HYDROCODONE-ACETAMINOPHEN 5-325 MG PO TABS: 1.0000 | ORAL_TABLET | Freq: Four times a day (QID) | ORAL | 0 refills | Status: DC | PRN

## 2022-01-25 MED ORDER — CIPROFLOXACIN HCL 500 MG PO TABS
500.0000 mg | ORAL_TABLET | ORAL | Status: AC
  Administered 2022-01-25: 500 mg via ORAL

## 2022-01-25 MED ORDER — DIPHENHYDRAMINE HCL 25 MG PO CAPS
ORAL_CAPSULE | ORAL | Status: AC
  Filled 2022-01-25: qty 1

## 2022-01-25 MED ORDER — DIPHENHYDRAMINE HCL 25 MG PO CAPS
25.0000 mg | ORAL_CAPSULE | ORAL | Status: AC
  Administered 2022-01-25: 25 mg via ORAL

## 2022-01-25 MED ORDER — CIPROFLOXACIN HCL 500 MG PO TABS
ORAL_TABLET | ORAL | Status: AC
  Filled 2022-01-25: qty 1

## 2022-01-25 NOTE — Interval H&P Note (Signed)
History and Physical Interval Note:  01/25/2022 9:15 AM  [name] [name]  has presented today for surgery, with the diagnosis of LEFT RENAL CALCULUS.  The various methods of treatment have been discussed with the patient and family. After consideration of risks, benefits and other options for treatment, the patient has consented to  Procedure(s): EXTRACORPOREAL SHOCK WAVE LITHOTRIPSY (ESWL) (Left) as a surgical intervention.  The patient's history has been reviewed, patient examined, no change in status, stable for surgery.  I have reviewed the patient's chart and labs.  Questions were answered to the patient's satisfaction.     [employer]

## 2022-01-25 NOTE — Discharge Instructions (Addendum)
1. You should strain your urine and collect all fragments and bring them to your follow up appointment.  2. You should take your pain medication as needed.  Please call if your pain is severe to the point that it is not controlled with your pain medication. 3. You should call if you develop fever > 101 or persistent nausea or vomiting.       4. Your doctor may prescribe tamsulosin to take to help facilitate stone passage.       5. You may resume your Eliquis in 48 hrs.  Post Anesthesia Home Care Instructions  Activity: Get plenty of rest for the remainder of the day. A responsible adult should stay with you for 24 hours following the procedure.  For the next 24 hours, DO NOT: -Drive a car -Paediatric nurse -Drink alcoholic beverages -Take any medication unless instructed by your physician -Make any legal decisions or sign important papers.  Meals: Start with liquid foods such as gelatin or soup. Progress to regular foods as tolerated. Avoid greasy, spicy, heavy foods. If nausea and/or vomiting occur, drink only clear liquids until the nausea and/or vomiting subsides. Call your physician if vomiting continues.  Special Instructions/Symptoms: Your throat may feel dry or sore from the anesthesia or the breathing tube placed in your throat during surgery. If this causes discomfort, gargle with warm salt water. The discomfort should disappear within 24 hours.  If you had a scopolamine patch placed behind your ear for the management of post- operative nausea and/or vomiting:  1. The medication in the patch is effective for 72 hours, after which it should be removed.  Wrap patch in a tissue and discard in the trash. Wash hands thoroughly with soap and water. 2. You may remove the patch earlier than 72 hours if you experience unpleasant side effects which may include dry mouth, dizziness or visual disturbances. 3. Avoid touching the patch. Wash your hands with soap and water after contact with  the patch.

## 2022-01-25 NOTE — Op Note (Signed)
See [facility] operative note scanned into chart. Also because of the size, density, location and other factors that cannot be anticipated I feel this will likely be a staged procedure. This fact supersedes any indication in the scanned [facility] operative note to the contrary.  

## 2022-01-26 ENCOUNTER — Encounter (INDEPENDENT_AMBULATORY_CARE_PROVIDER_SITE_OTHER): Payer: Self-pay | Admitting: Family Medicine

## 2022-01-26 ENCOUNTER — Ambulatory Visit (INDEPENDENT_AMBULATORY_CARE_PROVIDER_SITE_OTHER): Payer: Medicare Other | Admitting: Family Medicine

## 2022-01-26 VITALS — BP 135/84 | HR 54 | Temp 97.7°F | Ht 70.0 in | Wt 216.0 lb

## 2022-01-26 DIAGNOSIS — E559 Vitamin D deficiency, unspecified: Secondary | ICD-10-CM | POA: Diagnosis not present

## 2022-01-26 DIAGNOSIS — Z6835 Body mass index (BMI) 35.0-35.9, adult: Secondary | ICD-10-CM

## 2022-01-26 DIAGNOSIS — N200 Calculus of kidney: Secondary | ICD-10-CM | POA: Diagnosis not present

## 2022-01-26 DIAGNOSIS — E669 Obesity, unspecified: Secondary | ICD-10-CM

## 2022-01-26 DIAGNOSIS — Z6831 Body mass index (BMI) 31.0-31.9, adult: Secondary | ICD-10-CM

## 2022-01-26 NOTE — Progress Notes (Signed)
Chief Complaint:   OBESITY [name] [name] is here to discuss his progress with his obesity treatment plan along with follow-up of his obesity related diagnoses. [name] [name] is on keeping a food journal and adhering to recommended goals of 1500-1700 calories and 100+ grams of protein daily and states he is following his eating plan approximately 75-80% of the time. [name] [name] states he is walking for 30 minutes 6 times per week.  Today's visit was #: 87 Starting weight: 237 lbs Starting date: 08/18/2017 Today's weight: 216 lbs Today's date: 01/26/2022 Total lbs lost to date: 21 Total lbs lost since last in-office visit: 0  Interim History: [name] [name] is retaining some fluid today. He has increase his water intake, and he had a lithotripsy yesterday for a 10 mm stone. He has some discomfort this morning, but he is felling better. He has increased walking recently, and he is getting ready to go on a 3 week vacation to [place].   Subjective:   1. Vitamin D deficiency [name] [name] is stable on OTC Vit D, with no signs of over-replacement.   2. Nephrolithiasis [name] [name] had lithotripsy yesterday. He is on Victoza and has increased his water intake. He is on Flomax as well.   Assessment/Plan:   1. Vitamin D deficiency Low Vitamin D level contributes to fatigue and are associated with obesity, breast, and colon cancer. [name] [name] will continue OTC Vitamin D and we will plan to recheck labs in 2 months to avoid over-replacement.  2. Nephrolithiasis [name] [name] was encouraged to continues to increase his water intake for the next 3-5 days to make sure all fragments are flushed. We will recheck labs in 2 months.  3. Obesity with current BMI 31.0 [name] [name] is currently in the action stage of change. As such, his goal is to continue with weight loss efforts. He has agreed to the Category 3 Plan or keeping a food journal and adhering to recommended goals of 1500-1700 calories and 100+ grams of protein daily.   Exercise goals: As  is.  Behavioral modification strategies: increasing lean protein intake and travel eating strategies.  [name] [name] has agreed to follow-up with our clinic in 10 weeks. He was informed of the importance of frequent follow-up visits to maximize his success with intensive lifestyle modifications for his multiple health conditions.   Objective:   Blood pressure 135/84, pulse (!) 54, temperature 97.7 F (36.5 C), height 5\' 10"  (1.778 m), weight 216 lb (98 kg), SpO2 98 %. Body mass index is 30.99 kg/m.  General: Cooperative, alert, well developed, in no acute distress. HEENT: Conjunctivae and lids unremarkable. Cardiovascular: Regular rhythm.  Lungs: Normal work of breathing. Neurologic: No focal deficits.   Lab Results  Component Value Date   CREATININE 1.06 12/29/2021   BUN 24 (H) 12/29/2021   NA 137 12/29/2021   K 4.1 12/29/2021   CL 104 12/29/2021   CO2 22 12/29/2021   Lab Results  Component Value Date   ALT 18 11/30/2021   AST 20 11/30/2021   ALKPHOS 76 11/30/2021   BILITOT 0.8 11/30/2021   Lab Results  Component Value Date   HGBA1C 6.0 (H) 11/30/2021   HGBA1C 5.6 06/22/2021   HGBA1C 5.7 (H) 12/03/2020   HGBA1C 5.5 07/23/2020   HGBA1C 5.6 04/21/2020   Lab Results  Component Value Date   INSULIN 6.6 11/30/2021   INSULIN 6.2 06/22/2021   INSULIN 5.1 12/03/2020   INSULIN 5.2 07/23/2020   INSULIN 5.1 04/21/2020   Lab Results  Component Value Date  TSH 2.840 11/30/2021   Lab Results  Component Value Date   CHOL 174 11/30/2021   HDL 62 11/30/2021   LDLCALC 99 11/30/2021   TRIG 68 11/30/2021   CHOLHDL 3 10/11/2019   Lab Results  Component Value Date   VD25OH 69.3 11/30/2021   VD25OH 66.9 06/22/2021   VD25OH 73.1 12/03/2020   Lab Results  Component Value Date   WBC 10.6 (H) 12/29/2021   HGB 15.5 12/29/2021   HCT 44.1 12/29/2021   MCV 93.8 12/29/2021   PLT 177 12/29/2021   No results found for: IRON, TIBC, FERRITIN  Attestation Statements:   Reviewed  by clinician on day of visit: allergies, medications, problem list, medical history, surgical history, family history, social history, and previous encounter notes.  Time spent on visit including pre-visit chart review and post-visit care and charting was 33 minutes.    I, Trixie Dredge, am acting as transcriptionist for Dennard Nip, MD.  I have reviewed the above documentation for accuracy and completeness, and I agree with the above. -  Dennard Nip, MD

## 2022-01-28 ENCOUNTER — Encounter: Payer: Self-pay | Admitting: Family Medicine

## 2022-01-28 ENCOUNTER — Ambulatory Visit (INDEPENDENT_AMBULATORY_CARE_PROVIDER_SITE_OTHER): Payer: Medicare Other | Admitting: Family Medicine

## 2022-01-28 VITALS — BP 112/64 | HR 84 | Temp 97.7°F | Resp 16 | Ht 70.0 in | Wt 220.6 lb

## 2022-01-28 DIAGNOSIS — R739 Hyperglycemia, unspecified: Secondary | ICD-10-CM | POA: Diagnosis not present

## 2022-01-28 DIAGNOSIS — Z0000 Encounter for general adult medical examination without abnormal findings: Secondary | ICD-10-CM

## 2022-01-28 DIAGNOSIS — E559 Vitamin D deficiency, unspecified: Secondary | ICD-10-CM

## 2022-01-28 DIAGNOSIS — R252 Cramp and spasm: Secondary | ICD-10-CM | POA: Diagnosis not present

## 2022-01-28 DIAGNOSIS — C4359 Malignant melanoma of other part of trunk: Secondary | ICD-10-CM | POA: Diagnosis not present

## 2022-01-28 DIAGNOSIS — E781 Pure hyperglyceridemia: Secondary | ICD-10-CM | POA: Diagnosis not present

## 2022-01-28 DIAGNOSIS — B0229 Other postherpetic nervous system involvement: Secondary | ICD-10-CM

## 2022-01-28 DIAGNOSIS — I4819 Other persistent atrial fibrillation: Secondary | ICD-10-CM | POA: Diagnosis not present

## 2022-01-28 DIAGNOSIS — N200 Calculus of kidney: Secondary | ICD-10-CM | POA: Diagnosis not present

## 2022-01-28 DIAGNOSIS — G4733 Obstructive sleep apnea (adult) (pediatric): Secondary | ICD-10-CM | POA: Diagnosis not present

## 2022-01-28 DIAGNOSIS — Z9989 Dependence on other enabling machines and devices: Secondary | ICD-10-CM

## 2022-01-28 NOTE — Progress Notes (Signed)
° °Subjective:  ° ° Patient ID: [name] [name], male    DOB: 09/07/1941, 81 y.o.   MRN: [id number] ° °Chief Complaint  °Patient presents with  ° Annual Exam  ° ° ° °HPI °Patient is in today for annual preventative exam and follow up on chronic medical concerns. He had to undergo lithotripsy for a kidney stone on the left. He follows with Dr Borden of urology. He had a melanoma removed from his back which has healed well. No recent febrile illness. He continues to stay active and is accompanied by his wife. They plan to travel to [place] and [place] next month. Denies CP/palp/SOB/HA/congestion/fevers/GI or GU c/o. Taking meds as prescribed  ° °Past Medical History:  °Diagnosis Date  ° Atrial fibrillation (HCC)   ° BPH (benign prostatic hypertrophy)   ° Bradycardia 12/30/2014  ° HR routinely in mid 40s-50s  ° Coronary artery disease (CAD) excluded 03/2015  ° False-positive nuclear stress test suggesting inferior ischemia  ° Dysrhythmia   ° Elevated PSA   ° Encounter for Medicare annual wellness exam 09/22/2013  ° Sees Dr Dan Jones of Derm Sees Dr Carl Gessner of Gastroenterology Sees Dr Mark Ottelin of [facility] Sees Dr Sushmita DeAllen of Optometry  ° GERD (gastroesophageal reflux disease)   ° controlled w/ diet and behavioral changes, history of  ° Grade I diastolic dysfunction 2016  ° History of kidney stones   ° Hypertriglyceridemia 03/16/2015  ° Kidney stones   ° Lymphadenitis   ° Melanoma of back (HCC)   ° Excised Dr Dan Jones  ° Mild ascending aorta dilatation (HCC)   ° New onset a-fib (HCC) 03/10/2015  ° OA (osteoarthritis)   ° Otitis, externa, infective 08/10/2015  ° Palpitations   ° Pneumonia 2004  ° Post herpetic neuralgia   ° Shingles 07/09/2013  ° Sleep apnea 07/30/2011  ° cpap- 13   ° Snoring disorder 07/30/2011  ° ° °Past Surgical History:  °Procedure Laterality Date  ° CATARACT EXTRACTION Right   ° COLONOSCOPY  2018  ° CYSTOSCOPY/URETEROSCOPY/HOLMIUM LASER/STENT PLACEMENT Left  10/05/2019  ° Procedure: CYSTOSCOPY/RETROGRADE/URETEROSCOPY/HOLMIUM LASER/STENT PLACEMENT;  Surgeon: Ottelin, Mark, MD;  Location: [facility];  Service: Urology;  Laterality: Left;  ° EXTRACORPOREAL SHOCK WAVE LITHOTRIPSY Left 01/25/2022  ° Procedure: EXTRACORPOREAL SHOCK WAVE LITHOTRIPSY (ESWL);  Surgeon: Borden, Lester, MD;  Location: [facility];  Service: Urology;  Laterality: Left;  ° EYE SURGERY Left 12/06/2017  ° cataract by Dr Liles  ° INGUINAL HERNIA REPAIR Bilateral 06/01/2019  ° Procedure: LAPAROSCOPIC LEFT AND  RIGHT INGUINAL HERNIA REPAIR WITH MESH;  Surgeon: Gross, Steven, MD;  Location:  SURGERY CENTER;  Service: General;  Laterality: Bilateral;  ° LEFT HEART CATHETERIZATION WITH CORONARY ANGIOGRAM N/A 04/10/2015  ° Procedure: LEFT HEART CATHETERIZATION WITH  CORONARY ANGIOGRAM;  Surgeon: David W Harding, MD;  Location: [facility];  Service: Cardiovascular;  Angiographicallly NORMAL CORONARY ARTERIES  ° MASS EXCISION N/A 05/16/2014  ° Procedure: EXCISION POSTERIOR NECK MASS, RIGHT CHEST WALL MASS AND RIGHT AXILLARY MASS AXILLARY NODE DISSECTION;  Surgeon: Steven C. Gross, MD;  Location: [facility];  Service: General;  Laterality: N/A;  ° melanoma removal    ° MOUTH SURGERY N/A   ° NM MYOVIEW LTD  03/19/2015  ° FALSE POSITIVE:  INTERMEDIATE RISK. Small sized, moderate intensity inferior ischemic perfusion defect  ° REVERSE SHOULDER ARTHROPLASTY Left 10/16/2020  ° Procedure: REVERSE SHOULDER ARTHROPLASTY;  Surgeon: Supple, Kevin, MD;  Location: [facility];  Service:   Orthopedics;  Laterality: Left;  120min  ° TRANSTHORACIC ECHOCARDIOGRAM  03/20/2015  ° Normal LV size and function. EF 60-65%. G1 DD. Trivial AI. Borderline aortic root dilation (41 mm), Mild LA dilation.  ° VEIN LIGATION AND STRIPPING Left 07/13/2021  ° Procedure: LIGATION AND STRIPPING OF LEFT GREAT SAPHENOUS VEIN WITH STAB PHLEBECTOMY GREATER THAN 20 INCISIONS TO LEFT LEG;  Surgeon: Dickson, Christopher  S, MD;  Location: [facility];  Service: Vascular;  Laterality: Left;  ° ° °Family History  °Problem Relation Age of Onset  ° Cancer Mother 63  °     MM, leukemia  ° Obesity Mother   ° Emphysema Father 61  ° Obesity Father   ° Alcohol abuse Father   ° Cancer Sister 58  °     brain  ° Diabetes Sister 78  ° Obesity Sister   ° COPD Brother 73  ° Down syndrome Brother 59  °     aspirated  ° Diabetes Son   ° Pancreatitis Son   ° Heart disease Paternal Grandmother   ° COPD Paternal Grandfather   ° Alcohol abuse Other   ° Colon cancer Neg Hx   ° ° °Social History  ° °Socioeconomic History  ° Marital status: Married  °  Spouse name: [name]  ° Number of children: 2  ° Years of education: Not on file  ° Highest education level: Not on file  °Occupational History  ° Occupation: retired  °  Comment: truck driver  °Tobacco Use  ° Smoking status: Former  °  Types: Pipe, Cigars  °  Quit date: 02/11/1989  °  Years since quitting: 32.9  ° Smokeless tobacco: Never  °Vaping Use  ° Vaping Use: Never used  °Substance and Sexual Activity  ° Alcohol use: Yes  °  Alcohol/week: 7.0 standard drinks  °  Types: 7 Glasses of wine per week  °  Comment: 1 per day -- brandy or wine  ° Drug use: No  ° Sexual activity: Yes  °  Comment: lives with wife, retired from Truck driving, no dietary restrictions  °Other Topics Concern  ° Not on file  °Social History Narrative  ° He is a married father of 2, and grandfather of 4. He has been married to his wife [name] for 51 years. He previously lived in [place] but has moved with his wife to [place], [place] to be close to his daughter [name] and her family. [name] is our clinical pharmacist.  ° He previously worked as a truck driver and had 2 years of college after high school. He quit smoking in 1990. He has 6-7 glasses of brandy or wine a week.   ° He exercises regularly with low impact aerobics 2 days a week and daily walks of 30-45 minutes. His exercise sessions or 60 minutes. He will do some type of  exercise at least 7 days a week and is very active doing yard work and wood work.  °   ° Patient is right-handed. He lives with his wife in a one level home with a basement. He drinks 4-5 cups of decaf coffee a day. He and his wife go to the gym 1-2 x a week.  ° °Social Determinants of Health  ° °Financial Resource Strain: Low Risk   ° Difficulty of Paying Living Expenses: Not hard at all  °Food Insecurity: No Food Insecurity  ° Worried About Running Out of Food in the Last Year: Never true  ° Ran Out of   of Food in the Last Year: Never true  Transportation Needs: No Transportation Needs   Lack of Transportation (Medical): No   Lack of Transportation (Non-Medical): No  Physical Activity: Inactive   Days of Exercise per Week: 0 days   Minutes of Exercise per Session: 0 min  Stress: No Stress Concern Present   Feeling of Stress : Not at all  Social Connections: Moderately Isolated   Frequency of Communication with Friends and Family: More than three times a week   Frequency of Social Gatherings with Friends and Family: More than three times a week   Attends Religious Services: Never   Marine scientist or Organizations: No   Attends Music therapist: Never   Marital Status: Married  Human resources officer Violence: Not At Risk   Fear of Current or Ex-Partner: No   Emotionally Abused: No   Physically Abused: No   Sexually Abused: No    Outpatient Medications Prior to Visit  Medication Sig Dispense Refill   Cholecalciferol (VITAMIN D3) 125 MCG (5000 UT) CAPS Take 1 capsule (5,000 Units total) by mouth daily. (Patient taking differently: Take 5,000 Units by mouth daily.) 30 capsule 0   gabapentin (NEURONTIN) 300 MG capsule Take 3 capsules (900 mg total) by mouth 3 (three) times daily. 270 capsule 3   OVER THE COUNTER MEDICATION Apply 1 application topically daily as needed (Pain). Hemp freeze     sildenafil (VIAGRA) 100 MG tablet Take 100 mg by mouth daily as needed for erectile  dysfunction.     tamsulosin (FLOMAX) 0.4 MG CAPS capsule Take 1 capsule (0.4 mg total) by mouth at bedtime. 14 capsule 0   HYDROcodone-acetaminophen (NORCO/VICODIN) 5-325 MG tablet Take 1-2 tablets by mouth every 6 (six) hours as needed. 15 tablet 0   tamsulosin (FLOMAX) 0.4 MG CAPS capsule Take 1 capsule (0.4 mg total) by mouth daily. 30 capsule 0   COVID-19 mRNA bivalent vaccine, Pfizer, injection Inject into the muscle. 0.3 mL 0   No facility-administered medications prior to visit.    No Known Allergies  Review of Systems  Constitutional:  Negative for chills, fever and malaise/fatigue.  HENT:  Negative for congestion and hearing loss.   Eyes:  Negative for discharge.  Respiratory:  Negative for cough, sputum production and shortness of breath.   Cardiovascular:  Negative for chest pain, palpitations and leg swelling.  Gastrointestinal:  Negative for abdominal pain, blood in stool, constipation, diarrhea, heartburn, nausea and vomiting.  Genitourinary:  Negative for dysuria, frequency, hematuria and urgency.  Musculoskeletal:  Negative for back pain, falls and myalgias.  Skin:  Negative for rash.  Neurological:  Negative for dizziness, sensory change, loss of consciousness, weakness and headaches.  Endo/Heme/Allergies:  Negative for environmental allergies. Does not bruise/bleed easily.  Psychiatric/Behavioral:  Negative for depression and suicidal ideas. The patient is not nervous/anxious and does not have insomnia.       Objective:    Physical Exam Constitutional:      General: He is not in acute distress.    Appearance: Normal appearance. He is not ill-appearing or toxic-appearing.  HENT:     Head: Normocephalic and atraumatic.     Right Ear: External ear normal.     Left Ear: External ear normal.     Nose: Nose normal.  Eyes:     General:        Right eye: No discharge.        Left eye: No discharge.     Extraocular  Extraocular movements intact.      Conjunctiva/sclera: Conjunctivae normal.     Pupils: Pupils are equal, round, and reactive to light.  Cardiovascular:     Rate and Rhythm: irregularly irregular    Pulses: Normal pulses.     Heart sounds: Normal heart sounds.  Pulmonary:     Effort: Pulmonary effort is normal.     Breath sounds: Normal breath sounds. No wheezing.  Abdominal:     General: Bowel sounds are normal.     Palpations: Abdomen is soft.     Tenderness: There is no abdominal tenderness.  Skin:    Findings: No rash.  Neurological:     General: No focal deficit present.     Mental Status: He is alert and oriented to person, place, and time.  Psychiatric:        Behavior: Behavior normal.    BP 112/64    Pulse 84    Temp 97.7 F (36.5 C)    Resp 16    Ht $R'5\' 10"'Zw$  (1.778 m)    Wt 220 lb 9.6 oz (100.1 kg)    SpO2 94%    BMI 31.65 kg/m  Wt Readings from Last 3 Encounters:  01/28/22 220 lb 9.6 oz (100.1 kg)  01/26/22 216 lb (98 kg)  01/25/22 211 lb (95.7 kg)    Diabetic Foot Exam - Simple   No data filed    Lab Results  Component Value Date   WBC 10.6 (H) 12/29/2021   HGB 15.5 12/29/2021   HCT 44.1 12/29/2021   PLT 177 12/29/2021   GLUCOSE 95 12/29/2021   CHOL 174 11/30/2021   TRIG 68 11/30/2021   HDL 62 11/30/2021   LDLCALC 99 11/30/2021   ALT 18 11/30/2021   AST 20 11/30/2021   NA 137 12/29/2021   K 4.1 12/29/2021   CL 104 12/29/2021   CREATININE 1.06 12/29/2021   BUN 24 (H) 12/29/2021   CO2 22 12/29/2021   TSH 2.840 11/30/2021   PSA 4.94 (H) 01/15/2010   INR 1.22 04/04/2015   HGBA1C 6.0 (H) 11/30/2021    Lab Results  Component Value Date   TSH 2.840 11/30/2021   Lab Results  Component Value Date   WBC 10.6 (H) 12/29/2021   HGB 15.5 12/29/2021   HCT 44.1 12/29/2021   MCV 93.8 12/29/2021   PLT 177 12/29/2021   Lab Results  Component Value Date   NA 137 12/29/2021   K 4.1 12/29/2021   CO2 22 12/29/2021   GLUCOSE 95 12/29/2021   BUN 24 (H) 12/29/2021   CREATININE 1.06  12/29/2021   BILITOT 0.8 11/30/2021   ALKPHOS 76 11/30/2021   AST 20 11/30/2021   ALT 18 11/30/2021   PROT 6.7 11/30/2021   ALBUMIN 4.3 11/30/2021   CALCIUM 9.2 12/29/2021   ANIONGAP 11 12/29/2021   EGFR 84 11/30/2021   GFR 80.84 10/11/2019   Lab Results  Component Value Date   CHOL 174 11/30/2021   Lab Results  Component Value Date   HDL 62 11/30/2021   Lab Results  Component Value Date   LDLCALC 99 11/30/2021   Lab Results  Component Value Date   TRIG 68 11/30/2021   Lab Results  Component Value Date   CHOLHDL 3 10/11/2019   Lab Results  Component Value Date   HGBA1C 6.0 (H) 11/30/2021       Assessment & Plan:   Problem List Items Addressed This Visit     OSA on CPAP (Chronic)  Using CPAP nightly      Persistent atrial fibrillation (HCC) - CHA2DS2-VASc Score (Age-36, Aortic Plaque - 1) (Chronic)    Asymptomatic, rate controlled      Hypertriglyceridemia (Chronic)     encouraged heart healthy diet, avoid trans fats, minimize simple carbs and saturated fats. Increase exercise as tolerated      Post herpetic neuralgia    Continues to manage with Gabapentin      Melanoma of back [facility])    Has just had his second one removed has healed well      Preventative health care - Primary    Patient encouraged to maintain heart healthy diet, regular exercise, adequate sleep. Consider daily probiotics. Take medications as prescribed. Labs reviewed, immunizations reviewed. Last colonoscopy 2014 recommend optional repeat at 10 years.       Vitamin D deficiency    Supplement and monitor      Hyperglycemia    hgba1c acceptable, minimize simple carbs. Increase exercise as tolerated.       Muscle cramps    Hydrate and monitor      Kidney stone    Follows with Dr Alinda Money of [facility] just underwent lithotripsy on the left       I have discontinued Juniper A. Korte "Mike"'s COVID-19 mRNA bivalent vaccine [employer]) and HYDROcodone-acetaminophen. I am  also having him maintain his Vitamin D3, sildenafil, OVER THE COUNTER MEDICATION, gabapentin, and tamsulosin.  No orders of the defined types were placed in this encounter.    [name], MD

## 2022-01-28 NOTE — Assessment & Plan Note (Signed)
Using CPAP nightly 

## 2022-01-28 NOTE — Assessment & Plan Note (Signed)
Hydrate and monitor 

## 2022-01-28 NOTE — Assessment & Plan Note (Signed)
Supplement and monitor 

## 2022-01-28 NOTE — Patient Instructions (Signed)
Preventive Care 65 Years and Older, Male °Preventive care refers to lifestyle choices and visits with your health care provider that can promote health and wellness. Preventive care visits are also called wellness exams. °What can I expect for my preventive care visit? °Counseling °During your preventive care visit, your health care provider may ask about your: °Medical history, including: °Past medical problems. °Family medical history. °History of falls. °Current health, including: °Emotional well-being. °Home life and relationship well-being. °Sexual activity. °Memory and ability to understand (cognition). °Lifestyle, including: °Alcohol, nicotine or tobacco, and drug use. °Access to firearms. °Diet, exercise, and sleep habits. °Work and work environment. °Sunscreen use. °Safety issues such as seatbelt and bike helmet use. °Physical exam °Your health care provider will check your: °Height and weight. These may be used to calculate your BMI (body mass index). BMI is a measurement that tells if you are at a healthy weight. °Waist circumference. This measures the distance around your waistline. This measurement also tells if you are at a healthy weight and may help predict your risk of certain diseases, such as type 2 diabetes and high blood pressure. °Heart rate and blood pressure. °Body temperature. °Skin for abnormal spots. °What immunizations do I need? °Vaccines are usually given at various ages, according to a schedule. Your health care provider will recommend vaccines for you based on your age, medical history, and lifestyle or other factors, such as travel or where you work. °What tests do I need? °Screening °Your health care provider may recommend screening tests for certain conditions. This may include: °Lipid and cholesterol levels. °Diabetes screening. This is done by checking your blood sugar (glucose) after you have not eaten for a while (fasting). °Hepatitis C test. °Hepatitis B test. °HIV (human  immunodeficiency virus) test. °STI (sexually transmitted infection) testing, if you are at risk. °Lung cancer screening. °Colorectal cancer screening. °Prostate cancer screening. °Abdominal aortic aneurysm (AAA) screening. You may need this if you are a current or former smoker. °Talk with your health care provider about your test results, treatment options, and if necessary, the need for more tests. °Follow these instructions at home: °Eating and drinking ° °Eat a diet that includes fresh fruits and vegetables, whole grains, lean protein, and low-fat dairy products. Limit your intake of foods with high amounts of sugar, saturated fats, and salt. °Take vitamin and mineral supplements as recommended by your health care provider. °Do not drink alcohol if your health care provider tells you not to drink. °If you drink alcohol: °Limit how much you have to 0-2 drinks a day. °Know how much alcohol is in your drink. In the [place] one drink equals one 12 oz bottle of beer (355 mL), one 5 oz glass of wine (148 mL), or one 1½ oz glass of hard liquor (44 mL). °Lifestyle °Brush your teeth every morning and night with fluoride toothpaste. Floss one time each day. °Exercise for at least 30 minutes 5 or more days each week. °Do not use any products that contain nicotine or tobacco. These products include cigarettes, chewing tobacco, and vaping devices, such as e-cigarettes. If you need help quitting, ask your health care provider. °Do not use drugs. °If you are sexually active, practice safe sex. Use a condom or other form of protection to prevent STIs. °Take aspirin only as told by your health care provider. Make sure that you understand how much to take and what form to take. Work with your health care provider to find out whether it is safe and   beneficial for you to take aspirin daily. °Ask your health care provider if you need to take a cholesterol-lowering medicine (statin). °Find healthy ways to manage stress, such  as: °Meditation, yoga, or listening to music. °Journaling. °Talking to a trusted person. °Spending time with friends and family. °Safety °Always wear your seat belt while driving or riding in a vehicle. °Do not drive: °If you have been drinking alcohol. Do not ride with someone who has been drinking. °When you are tired or distracted. °While texting. °If you have been using any mind-altering substances or drugs. °Wear a helmet and other protective equipment during sports activities. °If you have firearms in your house, make sure you follow all gun safety procedures. °Minimize exposure to UV radiation to reduce your risk of skin cancer. °What's next? °Visit your health care provider once a year for an annual wellness visit. °Ask your health care provider how often you should have your eyes and teeth checked. °Stay up to date on all vaccines. °This information is not intended to replace advice given to you by your health care provider. Make sure you discuss any questions you have with your health care provider. °Document Revised: 06/10/2021 Document Reviewed: 06/10/2021 °Elsevier Patient Education © 2022 [employer] ° °

## 2022-01-28 NOTE — Progress Notes (Deleted)
° °Subjective:  ° ° Patient ID: [name] [name], male    DOB: 01/26/1941, 81 y.o.   MRN: [id number] ° °Chief Complaint  °Patient presents with  ° Annual Exam  ° ° ° °HPI °Patient is in today for annual preventative exam and follow up on chronic medical concerns. He had to undergo lithotripsy for a kidney stone on the left. He follows with Dr Borden of urology. He had a melanoma removed from his back which has healed well. No recent febrile illness. He continues to stay active and is accompanied by his wife. They plan to travel to [place] and [place] next month. Denies CP/palp/SOB/HA/congestion/fevers/GI or GU c/o. Taking meds as prescribed  ° °Past Medical History:  °Diagnosis Date  ° Atrial fibrillation (HCC)   ° BPH (benign prostatic hypertrophy)   ° Bradycardia 12/30/2014  ° HR routinely in mid 40s-50s  ° Coronary artery disease (CAD) excluded 03/2015  ° False-positive nuclear stress test suggesting inferior ischemia  ° Dysrhythmia   ° Elevated PSA   ° Encounter for Medicare annual wellness exam 09/22/2013  ° Sees Dr Dan Jones of Derm Sees Dr Carl Gessner of Gastroenterology Sees Dr Mark Ottelin of [facility] Sees Dr Sushmita DeAllen of Optometry  ° GERD (gastroesophageal reflux disease)   ° controlled w/ diet and behavioral changes, history of  ° Grade I diastolic dysfunction 2016  ° History of kidney stones   ° Hypertriglyceridemia 03/16/2015  ° Kidney stones   ° Lymphadenitis   ° Melanoma of back (HCC)   ° Excised Dr Dan Jones  ° Mild ascending aorta dilatation (HCC)   ° New onset a-fib (HCC) 03/10/2015  ° OA (osteoarthritis)   ° Otitis, externa, infective 08/10/2015  ° Palpitations   ° Pneumonia 2004  ° Post herpetic neuralgia   ° Shingles 07/09/2013  ° Sleep apnea 07/30/2011  ° cpap- 13   ° Snoring disorder 07/30/2011  ° ° °Past Surgical History:  °Procedure Laterality Date  ° CATARACT EXTRACTION Right   ° COLONOSCOPY  2018  ° CYSTOSCOPY/URETEROSCOPY/HOLMIUM LASER/STENT PLACEMENT Left  10/05/2019  ° Procedure: CYSTOSCOPY/RETROGRADE/URETEROSCOPY/HOLMIUM LASER/STENT PLACEMENT;  Surgeon: Ottelin, Mark, MD;  Location: [facility];  Service: Urology;  Laterality: Left;  ° EXTRACORPOREAL SHOCK WAVE LITHOTRIPSY Left 01/25/2022  ° Procedure: EXTRACORPOREAL SHOCK WAVE LITHOTRIPSY (ESWL);  Surgeon: Borden, Lester, MD;  Location: [facility];  Service: Urology;  Laterality: Left;  ° EYE SURGERY Left 12/06/2017  ° cataract by Dr Liles  ° INGUINAL HERNIA REPAIR Bilateral 06/01/2019  ° Procedure: LAPAROSCOPIC LEFT AND  RIGHT INGUINAL HERNIA REPAIR WITH MESH;  Surgeon: Gross, Steven, MD;  Location: [facility];  Service: General;  Laterality: Bilateral;  ° LEFT HEART CATHETERIZATION WITH CORONARY ANGIOGRAM N/A 04/10/2015  ° Procedure: LEFT HEART CATHETERIZATION WITH  CORONARY ANGIOGRAM;  Surgeon: David W Harding, MD;  Location: [facility];  Service: Cardiovascular;  Angiographicallly NORMAL CORONARY ARTERIES  ° MASS EXCISION N/A 05/16/2014  ° Procedure: EXCISION POSTERIOR NECK MASS, RIGHT CHEST WALL MASS AND RIGHT AXILLARY MASS AXILLARY NODE DISSECTION;  Surgeon: Steven C. Gross, MD;  Location: [facility];  Service: General;  Laterality: N/A;  ° melanoma removal    ° MOUTH SURGERY N/A   ° NM MYOVIEW LTD  03/19/2015  ° FALSE POSITIVE:  INTERMEDIATE RISK. Small sized, moderate intensity inferior ischemic perfusion defect  ° REVERSE SHOULDER ARTHROPLASTY Left 10/16/2020  ° Procedure: REVERSE SHOULDER ARTHROPLASTY;  Surgeon: Supple, Kevin, MD;  Location: [facility];  Service:   Orthopedics;  Laterality: Left;  120min  ° TRANSTHORACIC ECHOCARDIOGRAM  03/20/2015  ° Normal LV size and function. EF 60-65%. G1 DD. Trivial AI. Borderline aortic root dilation (41 mm), Mild LA dilation.  ° VEIN LIGATION AND STRIPPING Left 07/13/2021  ° Procedure: LIGATION AND STRIPPING OF LEFT GREAT SAPHENOUS VEIN WITH STAB PHLEBECTOMY GREATER THAN 20 INCISIONS TO LEFT LEG;  Surgeon: Dickson, Christopher  S, MD;  Location: [facility];  Service: Vascular;  Laterality: Left;  ° ° °Family History  °Problem Relation Age of Onset  ° Cancer Mother 63  °     MM, leukemia  ° Obesity Mother   ° Emphysema Father 61  ° Obesity Father   ° Alcohol abuse Father   ° Cancer Sister 58  °     brain  ° Diabetes Sister 78  ° Obesity Sister   ° COPD Brother 73  ° Down syndrome Brother 59  °     aspirated  ° Diabetes Son   ° Pancreatitis Son   ° Heart disease Paternal Grandmother   ° COPD Paternal Grandfather   ° Alcohol abuse Other   ° Colon cancer Neg Hx   ° ° °Social History  ° °Socioeconomic History  ° Marital status: Married  °  Spouse name: [name]  ° Number of children: 2  ° Years of education: Not on file  ° Highest education level: Not on file  °Occupational History  ° Occupation: retired  °  Comment: truck driver  °Tobacco Use  ° Smoking status: Former  °  Types: Pipe, Cigars  °  Quit date: 02/11/1989  °  Years since quitting: 32.9  ° Smokeless tobacco: Never  °Vaping Use  ° Vaping Use: Never used  °Substance and Sexual Activity  ° Alcohol use: Yes  °  Alcohol/week: 7.0 standard drinks  °  Types: 7 Glasses of wine per week  °  Comment: 1 per day -- brandy or wine  ° Drug use: No  ° Sexual activity: Yes  °  Comment: lives with wife, retired from Truck driving, no dietary restrictions  °Other Topics Concern  ° Not on file  °Social History Narrative  ° He is a married father of 2, and grandfather of 4. He has been married to his wife [name] for 51 years. He previously lived in [place] but has moved with his wife to [place], [place] to be close to his daughter [name] and her family. [name] is our clinical pharmacist.  ° He previously worked as a truck driver and had 2 years of college after high school. He quit smoking in 1990. He has 6-7 glasses of brandy or wine a week.   ° He exercises regularly with low impact aerobics 2 days a week and daily walks of 30-45 minutes. His exercise sessions or 60 minutes. He will do some type of  exercise at least 7 days a week and is very active doing yard work and wood work.  °   ° Patient is right-handed. He lives with his wife in a one level home with a basement. He drinks 4-5 cups of decaf coffee a day. He and his wife go to the gym 1-2 x a week.  ° °Social Determinants of Health  ° °Financial Resource Strain: Low Risk   ° Difficulty of Paying Living Expenses: Not hard at all  °Food Insecurity: No Food Insecurity  ° Worried About Running Out of Food in the Last Year: Never true  ° Ran Out of   of Food in the Last Year: Never true  Transportation Needs: No Transportation Needs   Lack of Transportation (Medical): No   Lack of Transportation (Non-Medical): No  Physical Activity: Inactive   Days of Exercise per Week: 0 days   Minutes of Exercise per Session: 0 min  Stress: No Stress Concern Present   Feeling of Stress : Not at all  Social Connections: Moderately Isolated   Frequency of Communication with Friends and Family: More than three times a week   Frequency of Social Gatherings with Friends and Family: More than three times a week   Attends Religious Services: Never   Marine scientist or Organizations: No   Attends Music therapist: Never   Marital Status: Married  Human resources officer Violence: Not At Risk   Fear of Current or Ex-Partner: No   Emotionally Abused: No   Physically Abused: No   Sexually Abused: No    Outpatient Medications Prior to Visit  Medication Sig Dispense Refill   Cholecalciferol (VITAMIN D3) 125 MCG (5000 UT) CAPS Take 1 capsule (5,000 Units total) by mouth daily. (Patient taking differently: Take 5,000 Units by mouth daily.) 30 capsule 0   gabapentin (NEURONTIN) 300 MG capsule Take 3 capsules (900 mg total) by mouth 3 (three) times daily. 270 capsule 3   OVER THE COUNTER MEDICATION Apply 1 application topically daily as needed (Pain). Hemp freeze     sildenafil (VIAGRA) 100 MG tablet Take 100 mg by mouth daily as needed for erectile  dysfunction.     tamsulosin (FLOMAX) 0.4 MG CAPS capsule Take 1 capsule (0.4 mg total) by mouth at bedtime. 14 capsule 0   HYDROcodone-acetaminophen (NORCO/VICODIN) 5-325 MG tablet Take 1-2 tablets by mouth every 6 (six) hours as needed. 15 tablet 0   tamsulosin (FLOMAX) 0.4 MG CAPS capsule Take 1 capsule (0.4 mg total) by mouth daily. 30 capsule 0   COVID-19 mRNA bivalent vaccine, Pfizer, injection Inject into the muscle. 0.3 mL 0   No facility-administered medications prior to visit.    No Known Allergies  Review of Systems  Constitutional:  Negative for chills, fever and malaise/fatigue.  HENT:  Negative for congestion and hearing loss.   Eyes:  Negative for discharge.  Respiratory:  Negative for cough, sputum production and shortness of breath.   Cardiovascular:  Negative for chest pain, palpitations and leg swelling.  Gastrointestinal:  Negative for abdominal pain, blood in stool, constipation, diarrhea, heartburn, nausea and vomiting.  Genitourinary:  Negative for dysuria, frequency, hematuria and urgency.  Musculoskeletal:  Negative for back pain, falls and myalgias.  Skin:  Negative for rash.  Neurological:  Negative for dizziness, sensory change, loss of consciousness, weakness and headaches.  Endo/Heme/Allergies:  Negative for environmental allergies. Does not bruise/bleed easily.  Psychiatric/Behavioral:  Negative for depression and suicidal ideas. The patient is not nervous/anxious and does not have insomnia.       Objective:    Physical Exam Constitutional:      General: He is not in acute distress.    Appearance: Normal appearance. He is not ill-appearing or toxic-appearing.  HENT:     Head: Normocephalic and atraumatic.     Right Ear: External ear normal.     Left Ear: External ear normal.     Nose: Nose normal.  Eyes:     General:        Right eye: No discharge.        Left eye: No discharge.     Extraocular  Extraocular movements intact.  °    Conjunctiva/sclera: Conjunctivae normal.  °   Pupils: Pupils are equal, round, and reactive to light.  °Cardiovascular:  °   Rate and Rhythm: irregularly irregular °   Pulses: Normal pulses.  °   Heart sounds: Normal heart sounds.  °Pulmonary:  °   Effort: Pulmonary effort is normal.  °   Breath sounds: Normal breath sounds. No wheezing.  °Abdominal:  °   General: Bowel sounds are normal.  °   Palpations: Abdomen is soft.  °   Tenderness: There is no abdominal tenderness.  °Skin: °   Findings: No rash.  °Neurological:  °   General: No focal deficit present.  °   Mental Status: He is alert and oriented to person, place, and time.  °Psychiatric:     °   Behavior: Behavior normal.  ° ° °BP 112/64    Pulse (!) 114    Temp 97.7 °F (36.5 °C)    Resp 16    Ht 5' 10" (1.778 m)    Wt 220 lb 9.6 oz (100.1 kg)    SpO2 94%    BMI 31.65 kg/m²  °Wt Readings from Last 3 Encounters:  °01/28/22 220 lb 9.6 oz (100.1 kg)  °01/26/22 216 lb (98 kg)  °01/25/22 211 lb (95.7 kg)  ° ° °Diabetic Foot Exam - Simple   °No data filed °  ° °Lab Results  °Component Value Date  ° WBC 10.6 (H) 12/29/2021  ° HGB 15.5 12/29/2021  ° HCT 44.1 12/29/2021  ° PLT 177 12/29/2021  ° GLUCOSE 95 12/29/2021  ° CHOL 174 11/30/2021  ° TRIG 68 11/30/2021  ° HDL 62 11/30/2021  ° LDLCALC 99 11/30/2021  ° ALT 18 11/30/2021  ° AST 20 11/30/2021  ° NA 137 12/29/2021  ° K 4.1 12/29/2021  ° CL 104 12/29/2021  ° CREATININE 1.06 12/29/2021  ° BUN 24 (H) 12/29/2021  ° CO2 22 12/29/2021  ° TSH 2.840 11/30/2021  ° PSA 4.94 (H) 01/15/2010  ° INR 1.22 04/04/2015  ° HGBA1C 6.0 (H) 11/30/2021  ° ° °Lab Results  °Component Value Date  ° TSH 2.840 11/30/2021  ° °Lab Results  °Component Value Date  ° WBC 10.6 (H) 12/29/2021  ° HGB 15.5 12/29/2021  ° HCT 44.1 12/29/2021  ° MCV 93.8 12/29/2021  ° PLT 177 12/29/2021  ° °Lab Results  °Component Value Date  ° NA 137 12/29/2021  ° K 4.1 12/29/2021  ° CO2 22 12/29/2021  ° GLUCOSE 95 12/29/2021  ° BUN 24 (H) 12/29/2021  ° CREATININE 1.06  12/29/2021  ° BILITOT 0.8 11/30/2021  ° ALKPHOS 76 11/30/2021  ° AST 20 11/30/2021  ° ALT 18 11/30/2021  ° PROT 6.7 11/30/2021  ° ALBUMIN 4.3 11/30/2021  ° CALCIUM 9.2 12/29/2021  ° ANIONGAP 11 12/29/2021  ° EGFR 84 11/30/2021  ° GFR 80.84 10/11/2019  ° °Lab Results  °Component Value Date  ° CHOL 174 11/30/2021  ° °Lab Results  °Component Value Date  ° HDL 62 11/30/2021  ° °Lab Results  °Component Value Date  ° LDLCALC 99 11/30/2021  ° °Lab Results  °Component Value Date  ° TRIG 68 11/30/2021  ° °Lab Results  °Component Value Date  ° CHOLHDL 3 10/11/2019  ° °Lab Results  °Component Value Date  ° HGBA1C 6.0 (H) 11/30/2021  ° ° °   °Assessment & Plan:  ° °Problem List Items Addressed This Visit   ° ° OSA on CPAP (  CPAP (Chronic)    Using CPAP nightly      Persistent atrial fibrillation (HCC) - CHA2DS2-VASc Score (Age-54, Aortic Plaque - 1) (Chronic)    Asymptomatic, rate controlled      Hypertriglyceridemia (Chronic)     encouraged heart healthy diet, avoid trans fats, minimize simple carbs and saturated fats. Increase exercise as tolerated      Post herpetic neuralgia    Continues to manage with Gabapentin      Melanoma of back [facility])    Has just had his second one removed has healed well      Preventative health care - Primary    Patient encouraged to maintain heart healthy diet, regular exercise, adequate sleep. Consider daily probiotics. Take medications as prescribed. Labs reviewed, immunizations reviewed. Last colonoscopy 2014 recommend optional repeat at 10 years.       Vitamin D deficiency    Supplement and monitor      Hyperglycemia    hgba1c acceptable, minimize simple carbs. Increase exercise as tolerated.       Muscle cramps    Hydrate and monitor      Kidney stone    Follows with Dr Alinda Money of [facility] just underwent lithotripsy on the left       I have discontinued Tige A. Schlender "Mike"'s COVID-19 mRNA bivalent vaccine [employer]) and HYDROcodone-acetaminophen. I am  also having him maintain his Vitamin D3, sildenafil, OVER THE COUNTER MEDICATION, gabapentin, and tamsulosin.  No orders of the defined types were placed in this encounter.    [name], MD

## 2022-01-28 NOTE — Assessment & Plan Note (Signed)
encouraged heart healthy diet, avoid trans fats, minimize simple carbs and saturated fats. Increase exercise as tolerated 

## 2022-01-28 NOTE — Assessment & Plan Note (Signed)
Patient encouraged to maintain heart healthy diet, regular exercise, adequate sleep. Consider daily probiotics. Take medications as prescribed. Labs reviewed, immunizations reviewed. Last colonoscopy 2014 recommend optional repeat at 10 years.

## 2022-01-28 NOTE — Assessment & Plan Note (Signed)
Asymptomatic, rate controlled 

## 2022-01-28 NOTE — Assessment & Plan Note (Signed)
Has just had his second one removed has healed well

## 2022-01-28 NOTE — Assessment & Plan Note (Signed)
Follows with Dr Alinda Money of [facility] just underwent lithotripsy on the left

## 2022-01-28 NOTE — Assessment & Plan Note (Addendum)
hgba1c acceptable, minimize simple carbs. Increase exercise as tolerated.  

## 2022-01-28 NOTE — Assessment & Plan Note (Signed)
Continues to manage with Gabapentin

## 2022-02-02 DIAGNOSIS — N200 Calculus of kidney: Secondary | ICD-10-CM | POA: Diagnosis not present

## 2022-02-09 ENCOUNTER — Encounter: Payer: Self-pay | Admitting: Cardiology

## 2022-02-09 DIAGNOSIS — D6869 Other thrombophilia: Secondary | ICD-10-CM | POA: Insufficient documentation

## 2022-02-09 NOTE — Assessment & Plan Note (Signed)
Tolerating well.  Stressed the importance of continued use to avoid adverse effect of OSA.

## 2022-02-09 NOTE — Assessment & Plan Note (Signed)
No longer bradycardic, no longer on AV nodal agents. Signs of chronotropic comments.

## 2022-02-09 NOTE — Assessment & Plan Note (Signed)
Essentially permanent A-fib.  On Eliquis for anticoagulation.  No major bleeding issues except for when he has nephrolithiasis.   Okay to hold Eliquis 2 to 3 days preop for surgeries or procedures.  Restart when safe postop.

## 2022-02-09 NOTE — Assessment & Plan Note (Signed)
Pretty much asymptomatic.  Maybe a little bit of exercise intolerance but currently not worried.  Rate well controlled without any medications. No major bleeding issues with Eliquis.  But he will need to hold Eliquis for his lithotripsy planned.

## 2022-02-09 NOTE — Assessment & Plan Note (Signed)
Most recent labs had excellent triglycerides.

## 2022-02-11 DIAGNOSIS — Z87442 Personal history of urinary calculi: Secondary | ICD-10-CM | POA: Diagnosis not present

## 2022-02-11 DIAGNOSIS — N281 Cyst of kidney, acquired: Secondary | ICD-10-CM | POA: Diagnosis not present

## 2022-02-12 DIAGNOSIS — G4733 Obstructive sleep apnea (adult) (pediatric): Secondary | ICD-10-CM | POA: Diagnosis not present

## 2022-03-12 DIAGNOSIS — G4733 Obstructive sleep apnea (adult) (pediatric): Secondary | ICD-10-CM | POA: Diagnosis not present

## 2022-03-30 ENCOUNTER — Ambulatory Visit (INDEPENDENT_AMBULATORY_CARE_PROVIDER_SITE_OTHER): Payer: Medicare Other | Admitting: Family Medicine

## 2022-04-01 DIAGNOSIS — N200 Calculus of kidney: Secondary | ICD-10-CM | POA: Diagnosis not present

## 2022-04-12 ENCOUNTER — Other Ambulatory Visit: Payer: Self-pay | Admitting: Family Medicine

## 2022-04-12 DIAGNOSIS — G4733 Obstructive sleep apnea (adult) (pediatric): Secondary | ICD-10-CM | POA: Diagnosis not present

## 2022-04-22 ENCOUNTER — Encounter (INDEPENDENT_AMBULATORY_CARE_PROVIDER_SITE_OTHER): Payer: Self-pay | Admitting: Family Medicine

## 2022-04-22 ENCOUNTER — Ambulatory Visit (INDEPENDENT_AMBULATORY_CARE_PROVIDER_SITE_OTHER): Payer: Medicare Other | Admitting: Family Medicine

## 2022-04-22 VITALS — BP 116/75 | HR 62 | Temp 97.3°F | Ht 70.0 in | Wt 212.0 lb

## 2022-04-22 DIAGNOSIS — Z87442 Personal history of urinary calculi: Secondary | ICD-10-CM | POA: Diagnosis not present

## 2022-04-22 DIAGNOSIS — Z6830 Body mass index (BMI) 30.0-30.9, adult: Secondary | ICD-10-CM

## 2022-04-22 DIAGNOSIS — E559 Vitamin D deficiency, unspecified: Secondary | ICD-10-CM | POA: Diagnosis not present

## 2022-04-22 DIAGNOSIS — E669 Obesity, unspecified: Secondary | ICD-10-CM

## 2022-04-23 LAB — VITAMIN D 25 HYDROXY (VIT D DEFICIENCY, FRACTURES): Vit D, 25-Hydroxy: 70.2 ng/mL (ref 30.0–100.0)

## 2022-05-03 DIAGNOSIS — D225 Melanocytic nevi of trunk: Secondary | ICD-10-CM | POA: Diagnosis not present

## 2022-05-03 DIAGNOSIS — D1801 Hemangioma of skin and subcutaneous tissue: Secondary | ICD-10-CM | POA: Diagnosis not present

## 2022-05-03 DIAGNOSIS — Z85820 Personal history of malignant melanoma of skin: Secondary | ICD-10-CM | POA: Diagnosis not present

## 2022-05-03 DIAGNOSIS — L570 Actinic keratosis: Secondary | ICD-10-CM | POA: Diagnosis not present

## 2022-05-03 DIAGNOSIS — L821 Other seborrheic keratosis: Secondary | ICD-10-CM | POA: Diagnosis not present

## 2022-05-06 NOTE — Progress Notes (Signed)
Chief Complaint:   OBESITY [name] [name] is here to discuss his progress with his obesity treatment plan along with follow-up of his obesity related diagnoses. [name] is on the Category 3 Plan or keeping a food journal and adhering to recommended goals of 1500-1700 calories and 100+ grams of protein daily and states he is following his eating plan approximately 75% of the time. Righteous states he is doing 0 minutes 0 times per week.  Today's visit was #: 30 Starting weight: 237 lbs Starting date: 08/18/2017 Today's weight: 212 lbs Today's date: 04/22/2022 Total lbs lost to date: 25 Total lbs lost since last in-office visit: 4  Interim History: [name] has been on a cruise to [place] and [place], but did well minimizing weight gain and increase activity (walking). He has gotten back on track with his plan.    Subjective:   1. Vitamin D deficiency [name] [name]'s last Vitamin D level was at goal. He is at high risk of over-replacement.   2. History of nephrolithiasis [name] [name] and his wife has multiple questions about nutrition and kidney stones, and how to work this advice into his eating plan.   Assessment/Plan:   1. Vitamin D deficiency We will check labs today. [name] [name] will follow-up for routine testing of Vitamin D, at least 2-3 times per year to avoid over-replacement.  - VITAMIN D 25 Hydroxy (Vit-D Deficiency, Fractures)  2. History of nephrolithiasis [name] [name] is to decrease the extra Ca+ in his diet, especially [name] [name] life milk and replace some of his protein with plant based protein. Recipe ideas were discussed and handout was given.   3. Obesity BMI today is 30.5 Mohmmad is currently in the action stage of change. As such, his goal is to continue with weight loss efforts. He has agreed to the Category 3 Plan.   Behavioral modification strategies: increasing lean protein intake.  [name] [name] has agreed to follow-up with our clinic in 3 months. He was informed of the importance of frequent follow-up  visits to maximize his success with intensive lifestyle modifications for his multiple health conditions.   [name] [name] was informed we would discuss his lab results at his next visit unless there is a critical issue that needs to be addressed sooner. [name] [name] agreed to keep his next visit at the agreed upon time to discuss these results.  Objective:   Blood pressure 116/75, pulse 62, temperature (!) 97.3 F (36.3 C), height '5\' 10"'$  (1.778 m), weight 212 lb (96.2 kg), SpO2 98 %. Body mass index is 30.42 kg/m.  General: Cooperative, alert, well developed, in no acute distress. HEENT: Conjunctivae and lids unremarkable. Cardiovascular: Regular rhythm.  Lungs: Normal work of breathing. Neurologic: No focal deficits.   Lab Results  Component Value Date   CREATININE 1.06 12/29/2021   BUN 24 (H) 12/29/2021   NA 137 12/29/2021   K 4.1 12/29/2021   CL 104 12/29/2021   CO2 22 12/29/2021   Lab Results  Component Value Date   ALT 18 11/30/2021   AST 20 11/30/2021   ALKPHOS 76 11/30/2021   BILITOT 0.8 11/30/2021   Lab Results  Component Value Date   HGBA1C 6.0 (H) 11/30/2021   HGBA1C 5.6 06/22/2021   HGBA1C 5.7 (H) 12/03/2020   HGBA1C 5.5 07/23/2020   HGBA1C 5.6 04/21/2020   Lab Results  Component Value Date   INSULIN 6.6 11/30/2021   INSULIN 6.2 06/22/2021   INSULIN 5.1 12/03/2020   INSULIN 5.2 07/23/2020   INSULIN 5.1 04/21/2020   Lab  Results  Component Value Date   TSH 2.840 11/30/2021   Lab Results  Component Value Date   CHOL 174 11/30/2021   HDL 62 11/30/2021   LDLCALC 99 11/30/2021   TRIG 68 11/30/2021   CHOLHDL 3 10/11/2019   Lab Results  Component Value Date   VD25OH 70.2 04/22/2022   VD25OH 69.3 11/30/2021   VD25OH 66.9 06/22/2021   Lab Results  Component Value Date   WBC 10.6 (H) 12/29/2021   HGB 15.5 12/29/2021   HCT 44.1 12/29/2021   MCV 93.8 12/29/2021   PLT 177 12/29/2021   No results found for: IRON, TIBC, FERRITIN  Attestation Statements:    Reviewed by clinician on day of visit: allergies, medications, problem list, medical history, surgical history, family history, social history, and previous encounter notes.  Time spent on visit including pre-visit chart review and post-visit care and charting was 40 minutes.    I, Trixie Dredge, am acting as transcriptionist for Dennard Nip, MD.  I have reviewed the above documentation for accuracy and completeness, and I agree with the above. -  Dennard Nip, MD

## 2022-05-12 DIAGNOSIS — G4733 Obstructive sleep apnea (adult) (pediatric): Secondary | ICD-10-CM | POA: Diagnosis not present

## 2022-05-14 ENCOUNTER — Other Ambulatory Visit: Payer: Self-pay | Admitting: Family Medicine

## 2022-06-12 DIAGNOSIS — G4733 Obstructive sleep apnea (adult) (pediatric): Secondary | ICD-10-CM | POA: Diagnosis not present

## 2022-06-15 ENCOUNTER — Ambulatory Visit (INDEPENDENT_AMBULATORY_CARE_PROVIDER_SITE_OTHER): Payer: Medicare Other | Admitting: Family Medicine

## 2022-06-15 ENCOUNTER — Encounter (INDEPENDENT_AMBULATORY_CARE_PROVIDER_SITE_OTHER): Payer: Self-pay | Admitting: Family Medicine

## 2022-06-15 VITALS — BP 113/76 | HR 74 | Temp 97.3°F | Ht 70.0 in | Wt 212.6 lb

## 2022-06-15 DIAGNOSIS — Z6830 Body mass index (BMI) 30.0-30.9, adult: Secondary | ICD-10-CM | POA: Diagnosis not present

## 2022-06-15 DIAGNOSIS — E559 Vitamin D deficiency, unspecified: Secondary | ICD-10-CM

## 2022-06-15 DIAGNOSIS — E669 Obesity, unspecified: Secondary | ICD-10-CM

## 2022-06-16 NOTE — Progress Notes (Signed)
Chief Complaint:   OBESITY [name] [name] is here to discuss his progress with his obesity treatment plan along with follow-up of his obesity related diagnoses. [name] [name] is on the Category 3 Plan and states he is following his eating plan approximately 70% of the time. [name] [name] states he is doing 0 minutes 0 times per week.  Today's visit was #: 18 Starting weight: 237 lbs Starting date: 08/18/2017 Today's weight: 212 lbs Today's date: 06/15/2022 Total lbs lost to date: 25 Total lbs lost since last in-office visit: 0  Interim History: [name] [name] has done well with maintaining his weight.  He is working on increasing protein and eating lighter at dinner.  He especially enjoys soup.  Subjective:   1. Vitamin D deficiency [name] [name]'s vitamin D level is at goal and he is at high risk of over replacement.  Assessment/Plan:   1. Vitamin D deficiency [name] [name] will continue OTC vitamin D 5000 units daily.  We will recheck labs in 2 months.  2. Obesity, Current BMI 30.5 [name] [name] is currently in the action stage of change. As such, his goal is to continue with weight loss efforts. He has agreed to the Category 3 Plan.   High-protein low-sodium soup recipes were given.  Behavioral modification strategies: increasing lean protein intake.  [name] [name] has agreed to follow-up with our clinic in 2 to 3 months. He was informed of the importance of frequent follow-up visits to maximize his success with intensive lifestyle modifications for his multiple health conditions.   Objective:   Blood pressure 113/76, pulse 74, temperature (!) 97.3 F (36.3 C), height '5\' 10"'$  (1.778 m), weight 212 lb 9.6 oz (96.4 kg), SpO2 100 %. Body mass index is 30.5 kg/m.  General: Cooperative, alert, well developed, in no acute distress. HEENT: Conjunctivae and lids unremarkable. Cardiovascular: Regular rhythm.  Lungs: Normal work of breathing. Neurologic: No focal deficits.   Lab Results  Component Value Date   CREATININE 1.06  12/29/2021   BUN 24 (H) 12/29/2021   NA 137 12/29/2021   K 4.1 12/29/2021   CL 104 12/29/2021   CO2 22 12/29/2021   Lab Results  Component Value Date   ALT 18 11/30/2021   AST 20 11/30/2021   ALKPHOS 76 11/30/2021   BILITOT 0.8 11/30/2021   Lab Results  Component Value Date   HGBA1C 6.0 (H) 11/30/2021   HGBA1C 5.6 06/22/2021   HGBA1C 5.7 (H) 12/03/2020   HGBA1C 5.5 07/23/2020   HGBA1C 5.6 04/21/2020   Lab Results  Component Value Date   INSULIN 6.6 11/30/2021   INSULIN 6.2 06/22/2021   INSULIN 5.1 12/03/2020   INSULIN 5.2 07/23/2020   INSULIN 5.1 04/21/2020   Lab Results  Component Value Date   TSH 2.840 11/30/2021   Lab Results  Component Value Date   CHOL 174 11/30/2021   HDL 62 11/30/2021   LDLCALC 99 11/30/2021   TRIG 68 11/30/2021   CHOLHDL 3 10/11/2019   Lab Results  Component Value Date   VD25OH 70.2 04/22/2022   VD25OH 69.3 11/30/2021   VD25OH 66.9 06/22/2021   Lab Results  Component Value Date   WBC 10.6 (H) 12/29/2021   HGB 15.5 12/29/2021   HCT 44.1 12/29/2021   MCV 93.8 12/29/2021   PLT 177 12/29/2021   No results found for: "IRON", "TIBC", "FERRITIN"  Attestation Statements:   Reviewed by clinician on day of visit: allergies, medications, problem list, medical history, surgical history, family history, social history, and previous encounter notes.  Time  spent on visit including pre-visit chart review and post-visit care and charting was 30 minutes.   I, Trixie Dredge, am acting as transcriptionist for Dennard Nip, MD.  I have reviewed the above documentation for accuracy and completeness, and I agree with the above. -  Dennard Nip, MD

## 2022-07-12 DIAGNOSIS — G4733 Obstructive sleep apnea (adult) (pediatric): Secondary | ICD-10-CM | POA: Diagnosis not present

## 2022-07-18 ENCOUNTER — Other Ambulatory Visit: Payer: Self-pay | Admitting: Family Medicine

## 2022-07-29 ENCOUNTER — Ambulatory Visit: Payer: Medicare Other | Admitting: Family Medicine

## 2022-08-04 ENCOUNTER — Encounter (INDEPENDENT_AMBULATORY_CARE_PROVIDER_SITE_OTHER): Payer: Self-pay

## 2022-08-04 DIAGNOSIS — I4821 Permanent atrial fibrillation: Secondary | ICD-10-CM

## 2022-08-04 DIAGNOSIS — E781 Pure hyperglyceridemia: Secondary | ICD-10-CM

## 2022-08-04 DIAGNOSIS — R739 Hyperglycemia, unspecified: Secondary | ICD-10-CM

## 2022-08-04 DIAGNOSIS — E559 Vitamin D deficiency, unspecified: Secondary | ICD-10-CM

## 2022-08-09 ENCOUNTER — Ambulatory Visit (INDEPENDENT_AMBULATORY_CARE_PROVIDER_SITE_OTHER): Payer: Medicare Other

## 2022-08-09 VITALS — BP 112/73 | HR 83 | Temp 97.6°F | Resp 16 | Ht 70.0 in | Wt 224.0 lb

## 2022-08-09 DIAGNOSIS — Z0000 Encounter for general adult medical examination without abnormal findings: Secondary | ICD-10-CM | POA: Diagnosis not present

## 2022-08-09 NOTE — Progress Notes (Signed)
Subjective:   [name] [name] is a 81 y.o. male who presents for Medicare Annual/Subsequent preventive examination.  Review of Systems     Cardiac Risk Factors include: advanced age (>72mn, >>66women);male gender;obesity (BMI >30kg/m2)     Objective:    Today's Vitals   08/09/22 1022 08/09/22 1033  BP: 112/73   Pulse: 83   Resp: 16   Temp: 97.6 F (36.4 C)   SpO2: 95%   Weight: 224 lb (101.6 kg)   Height: '5\' 10"'$  (1.778 m)   PainSc:  3    Body mass index is 32.14 kg/m.     08/09/2022   10:16 AM 01/25/2022    8:24 AM 12/29/2021    1:16 PM 08/05/2021   11:04 AM 07/13/2021    6:27 AM 10/10/2020   10:23 AM 08/04/2020   11:35 AM  Advanced Directives  Does Patient Have a Medical Advance Directive? Yes Yes No Yes Yes Yes Yes  Type of AParamedicof AAllensworthLiving will HEngelhardLiving will  HNislandLiving will HBushnellLiving will HLathropLiving will [place]utherfordLiving will  Does patient want to make changes to medical advance directive? No - Patient declined      No - Patient declined  Copy of [place]in Chart? No - copy requested No - copy requested  Yes - validated most recent copy scanned in chart (See row information)   Yes - validated most recent copy scanned in chart (See row information)    Current Medications (verified) Outpatient Encounter Medications as of 08/09/2022  Medication Sig   Cholecalciferol (VITAMIN D3) 125 MCG (5000 UT) CAPS Take 1 capsule (5,000 Units total) by mouth daily. (Patient taking differently: Take 5,000 Units by mouth daily.)   ELIQUIS 5 MG TABS tablet Take 5 mg by mouth 2 (two) times daily.   gabapentin (NEURONTIN) 300 MG capsule TAKE 3 CAPSULES BY MOUTH THREE TIMES DAILY   OVER THE COUNTER MEDICATION Apply 1 application topically daily as needed (Pain). Hemp freeze   sildenafil (VIAGRA) 100 MG tablet  Take 100 mg by mouth daily as needed for erectile dysfunction.   tamsulosin (FLOMAX) 0.4 MG CAPS capsule Take 1 capsule (0.4 mg total) by mouth at bedtime.   No facility-administered encounter medications on file as of 08/09/2022.    Allergies (verified) Patient has no known allergies.   History: Past Medical History:  Diagnosis Date   Atrial fibrillation (HCC)    BPH (benign prostatic hypertrophy)    Bradycardia 12/30/2014   HR routinely in mid 40s-50s   Coronary artery disease (CAD) excluded 03/2015   False-positive nuclear stress test suggesting inferior ischemia   Dysrhythmia    Elevated PSA    Encounter for Medicare annual wellness exam 09/22/2013   Sees Dr DWilhemina Bonitoof Derm Sees Dr CSilvano Ruskof Gastroenterology Sees Dr MKathie Rhodesof [facility] Sees Dr SSusa Dayof Optometry   GERD (gastroesophageal reflux disease)    controlled w/ diet and behavioral changes, history of   Grade I diastolic dysfunction 24765  History of kidney stones    Hypertriglyceridemia 03/16/2015   Kidney stones    Lymphadenitis    Melanoma of back ([place]    Excised Dr DWilhemina Bonito  Mild ascending aorta dilatation [place]    New onset a-fib ([place] 03/10/2015   OA (osteoarthritis)    Otitis, externa, infective 08/10/2015   Palpitations  Pneumonia 2004   Post herpetic neuralgia    Shingles 07/09/2013   Sleep apnea 07/30/2011   cpap- 13    Snoring disorder 07/30/2011   Past Surgical History:  Procedure Laterality Date   CATARACT EXTRACTION Right    COLONOSCOPY  2018   CYSTOSCOPY/URETEROSCOPY/HOLMIUM LASER/STENT PLACEMENT Left 10/05/2019   Procedure: CYSTOSCOPY/RETROGRADE/URETEROSCOPY/HOLMIUM LASER/STENT PLACEMENT;  Surgeon: Kathie Rhodes, MD;  [facility];  Service: Urology;  Laterality: Left;   EXTRACORPOREAL SHOCK WAVE LITHOTRIPSY Left 01/25/2022   Procedure: EXTRACORPOREAL SHOCK WAVE LITHOTRIPSY (ESWL);  Surgeon: Raynelle Bring, MD;  Location: [facility];  Service: Urology;  Laterality: Left;   EYE SURGERY Left 12/06/2017   cataract by Dr Logan Bores HERNIA REPAIR Bilateral 06/01/2019   Procedure: LAPAROSCOPIC LEFT AND  RIGHT INGUINAL HERNIA REPAIR WITH MESH;  Surgeon: Michael Boston, MD;  Location: [place];  Service: General;  Laterality: Bilateral;   LEFT HEART CATHETERIZATION WITH CORONARY ANGIOGRAM N/A 04/10/2015   Procedure: LEFT HEART CATHETERIZATION WITH  CORONARY ANGIOGRAM;  Surgeon: Leonie Man, MD;  Location: [facility] C[facility];  Service: Cardiovascular;  Angiographicallly NORMAL CORONARY ARTERIES   MASS EXCISION N/A 05/16/2014   Procedure: EXCISION POSTERIOR NECK MASS, RIGHT CHEST WALL MASS AND RIGHT AXILLARY MASS AXILLARY NODE DISSECTION;  Surgeon: Adin Hector, MD;  Location: [facility];  Service: General;  Laterality: N/A;   melanoma removal     MOUTH SURGERY N/A    NM MYOVIEW LTD  03/19/2015   FALSE POSITIVE:  INTERMEDIATE RISK. Small sized, moderate intensity inferior ischemic perfusion defect   REVERSE SHOULDER ARTHROPLASTY Left 10/16/2020   Procedure: REVERSE SHOULDER ARTHROPLASTY;  Surgeon: Justice Britain, MD;  Location: [facility];  Service: Orthopedics;  Laterality: Left;  131mn   TRANSTHORACIC ECHOCARDIOGRAM  03/20/2015   Normal LV size and function. EF 60-65%. G1 DD. Trivial AI. Borderline aortic root dilation (41 mm), Mild LA dilation.   VEIN LIGATION AND STRIPPING Left 07/13/2021   Procedure: LIGATION AND STRIPPING OF LEFT GREAT SAPHENOUS VEIN WITH STAB PHLEBECTOMY GREATER THAN 20 INCISIONS TO LEFT LEG;  Surgeon: DAngelia Mould MD;  [facility];  Service: Vascular;  Laterality: Left;   Family History  Problem Relation Age of Onset   Cancer Mother 642      MM, leukemia   Obesity Mother    Emphysema Father 643  Obesity Father    Alcohol abuse Father    Cancer Sister 534      brain   Diabetes Sister 771  Obesity Sister    COPD Brother 755  Down syndrome Brother 543       aspirated   Diabetes Son    Pancreatitis Son    Heart disease Paternal Grandmother    COPD Paternal Grandfather    Alcohol abuse Other    Colon cancer Neg Hx    Social History   Socioeconomic History   Marital status: Married    Spouse name: [name]   Number of children: 2   Years of education: Not on file   Highest education level: Not on file  Occupational History   Occupation: retired    Comment: truck driver  Tobacco Use   Smoking status: Former    Types: Pipe, Cigars    Quit date: 02/11/1989    Years since quitting: 33.5   Smokeless tobacco: Never  Vaping Use   Vaping Use: Never used  Substance and Sexual Activity   Alcohol  use: Yes    Alcohol/week: 7.0 standard drinks of alcohol    Types: 7 Glasses of wine per week    Comment: 1 per day -- brandy or wine   Drug use: No   Sexual activity: Yes    Comment: lives with wife, retired from [employer] driving, no dietary restrictions  Other Topics Concern   Not on file  Social History Narrative   He is a married father of 2, and grandfather of 64. He has been married to his wife [name] for 51 years. He previously lived in [place] but has moved with his wife to [place], [place] to be close to his daughter [name] and her family. [name] is our Nurse, adult.   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 He quit smoking in 1990. He has 6-7 glasses of brandy or wine a week.    He exercises regularly with low impact aerobics 2 days a week and daily walks of 30-45 minutes. His exercise sessions or 60 minutes. He will do some type of exercise at least 7 days a week and is very active doing yard work and wood work.      Patient is right-handed. He lives with his wife in a one level home with a basement. He drinks 4-5 cups of decaf coffee a day. He and his wife go to the gym 1-2 x a week.   Social Determinants of Health   Financial Resource Strain: Low Risk  (08/05/2021)   Overall  Financial Resource Strain (CARDIA)    Difficulty of Paying Living Expenses: Not hard at all  Food Insecurity: No Food Insecurity (08/05/2021)   Hunger Vital Sign    Worried About Running Out of Food in the Last Year: Never true    Ran Out of Food in the Last Year: Never true  Transportation Needs: No Transportation Needs (08/05/2021)   PRAPARE - Hydrologist (Medical): No    Lack of Transportation (Non-Medical): No  Physical Activity: Inactive (08/05/2021)   Exercise Vital Sign    Days of Exercise per Week: 0 days    Minutes of Exercise per Session: 0 min  Stress: No Stress Concern Present (08/05/2021)   [place]    Feeling of Stress : Not at all  Social Connections: Moderately Isolated (08/05/2021)   Social Connection and Isolation Panel [NHANES]    Frequency of Communication with Friends and Family: More than three times a week    Frequency of Social Gatherings with Friends and Family: More than three times a week    Attends Religious Services: Never    Marine scientist or Organizations: No    Attends Music therapist: Never    Marital Status: Married    Tobacco Counseling Counseling given: Not Answered   Clinical Intake:  Pre-visit preparation completed: Yes  Pain : 0-10 Pain Score: 3  Pain Type: Chronic pain Pain Location: Other (Comment) (under arm) Pain Orientation: Right Pain Descriptors / Indicators: Aching, Dull, Sore Pain Onset: More than a month ago Pain Frequency: Intermittent     BMI - recorded: 32.14 Nutritional Status: BMI > 30  Obese Nutritional Risks: None Diabetes: No  How often do you need to have someone help you when you read instructions, pamphlets, or other written materials from your doctor or pharmacy?: 1 - Never  Diabetic?no  Interpreter Needed?: No  Information entered by ::  Inda Castle Agustus Mane   Activities of Daily Living     08/09/2022   10:35 AM 01/25/2022    8:26 AM  In your present state of health, do you have any difficulty performing the following activities:  Hearing? 1 0  Comment hearing aids   Vision? 0 0  Difficulty concentrating or making decisions? 0 0  Walking or climbing stairs? 0 0  Dressing or bathing? 0 0  Doing errands, shopping? 0   Preparing Food and eating ? N   Using the Toilet? N   In the past six months, have you accidently leaked urine? N   Do you have problems with loss of bowel control? N   Managing your Medications? N   Managing your Finances? N   Housekeeping or managing your Housekeeping? N     Patient Care Team: Mosie Lukes, MD as PCP - General (Family Medicine) Leonie Man, MD as PCP - Cardiology (Cardiology) Gatha Mayer, MD as Consulting Physician (Gastroenterology) Danella Sensing, MD as Consulting Physician (Dermatology) Kathie Rhodes, MD (Inactive) as Consulting Physician (Urology) Leonie Man, MD as Consulting Physician (Cardiology) Lynda Rainwater, DDS as Consulting Physician (Dentistry) Michael Boston, MD as Consulting Physician (General Surgery) Day, Melvenia Beam, [facility] (Inactive) (Pharmacist) Day, Melvenia Beam, [facility] (Inactive) as Pharmacist (Pharmacist)  Indicate any recent Medical Services you may have received from other than [facility] providers in the past year (date may be approximate).     Assessment:   This is a routine wellness examination for [name] [name].  Hearing/Vision screen No results found.  Dietary issues and exercise activities discussed: Current Exercise Habits: Home exercise routine, Type of exercise: walking, Time (Minutes): 60, Frequency (Times/Week): 7, Weekly Exercise (Minutes/Week): 420, Intensity: Mild, Exercise limited by: None identified   Goals Addressed             This Visit's Progress    Maintain healthy lifestyle.   On track      Depression Screen    08/09/2022   10:17 AM 08/05/2021   11:06 AM 08/04/2020   11:42 AM  08/18/2017    9:29 AM 06/09/2017    9:06 AM 03/30/2016    9:29 AM 03/10/2015    2:28 PM  PHQ 2/9 Scores  PHQ - 2 Score 0 0 0 1 0 0 0  PHQ- 9 Score    7   0    Fall Risk    08/09/2022   10:17 AM 08/05/2021   11:05 AM 08/04/2020   11:42 AM 12/14/2018   12:50 PM 06/09/2017    9:06 AM  Fall Risk   Falls in the past year? 0 0 0 0 No  Number falls in past yr: 0 1 0    Injury with Fall? 0 0 0    Risk for fall due to : No Fall Risks      Follow up Falls evaluation completed Falls prevention discussed Education provided;Falls prevention discussed Falls evaluation completed     FALL RISK PREVENTION PERTAINING TO THE HOME:  Any stairs in or around the home? Yes  If so, are there any without handrails? No  Home free of loose throw rugs in walkways, pet beds, electrical cords, etc? Yes  Adequate lighting in your home to reduce risk of falls? Yes   ASSISTIVE DEVICES UTILIZED TO PREVENT FALLS:  Life alert? No  Use of a cane, walker or w/c? No  Grab bars in the bathroom? No  Shower chair or bench in shower? No  Elevated toilet  seat or a handicapped toilet? Yes   TIMED UP AND GO:  Was the test performed? No .  Length of time to ambulate 10 feet: 10 sec.   Gait steady and fast without use of assistive device  Cognitive Function:    03/30/2016    9:40 AM  MMSE - Mini Mental State Exam  Orientation to time 5  Orientation to Place 5  Registration 3  Attention/ Calculation 5  Recall 2  Language- name 2 objects 2  Language- repeat 1  Language- follow 3 step command 3  Language- read & follow direction 1  Write a sentence 1  Copy design 1  Total score 29        08/09/2022   10:36 AM 08/04/2020   11:38 AM  6CIT Screen  What Year? 0 points 0 points  What month? 0 points 0 points  What time? 0 points 0 points  Count back from 20 0 points 0 points  Months in reverse 2 points 0 points  Repeat phrase 0 points 0 points  Total Score 2 points 0 points    Immunizations Immunization  History  Administered Date(s) Administered   Fluad Quad(high Dose 65+) 10/11/2019, 09/11/2020   Influenza Whole 09/15/2009, 09/23/2010   Influenza, High Dose Seasonal PF 09/21/2016, 11/07/2017, 10/06/2018   Influenza,inj,Quad PF,6+ Mos 09/20/2013, 10/18/2014, 09/08/2015   PFIZER Comirnaty(Gray Top)Covid-19 Tri-Sucrose Vaccine 05/18/2021   PFIZER(Purple Top)SARS-COV-2 Vaccination 01/11/2020, 02/01/2020, 10/03/2020   Pfizer Covid-19 Vaccine Bivalent Booster 55yr & up 09/29/2021   Pneumococcal Conjugate-13 11/19/2013   Pneumococcal Polysaccharide-23 04/09/2003, 10/31/2015   Td 06/04/2010   Zoster Recombinat (Shingrix) 09/30/2017, 12/08/2017    TDAP status: Up to date  Flu Vaccine status: Up to date  Pneumococcal vaccine status: Up to date  Covid-19 vaccine status: Completed vaccines  Qualifies for Shingles Vaccine? Yes   Zostavax completed No   Shingrix Completed?: Yes  Screening Tests Health Maintenance  Topic Date Due   TETANUS/TDAP  06/04/2020   COVID-19 Vaccine (6 - Pfizer risk series) 11/24/2021   INFLUENZA VACCINE  07/27/2022   Pneumonia Vaccine 81 Years old  Completed   Zoster Vaccines- Shingrix  Completed   HPV VACCINES  Aged Out   Hepatitis C Screening  Discontinued    Health Maintenance  Health Maintenance Due  Topic Date Due   TETANUS/TDAP  06/04/2020   COVID-19 Vaccine (6 - Pfizer risk series) 11/24/2021   INFLUENZA VACCINE  07/27/2022    Colorectal cancer screening: No longer required.   Lung Cancer Screening: (Low Dose CT Chest recommended if Age 79-80years, 30 pack-year currently smoking OR have quit w/in 15years.) does not qualify.   Lung Cancer Screening Referral: n/a  Additional Screening:  Hepatitis C Screening: does not qualify; Completed aged out  Vision Screening: Recommended annual ophthalmology exams for early detection of glaucoma and other disorders of the eye. Is the patient up to date with their annual eye exam?  Yes  Who is the  provider or what is the name of the office in which the patient attends annual eye exams? Dr. LPrudencio BurlyIf pt is not established with a provider, would they like to be referred to a provider to establish care? No .   Dental Screening: Recommended annual dental exams for proper oral hygiene  Community Resource Referral / Chronic Care Management: CRR required this visit?  No   CCM required this visit?  No      Plan:     I have personally reviewed and noted  the following in the patient's chart:   Medical and social history Use of alcohol, tobacco or illicit drugs  Current medications and supplements including opioid prescriptions. Patient is not currently taking opioid prescriptions. Functional ability and status Nutritional status Physical activity Advanced directives List of other physicians Hospitalizations, surgeries, and ER visits in previous 12 months Vitals Screenings to include cognitive, depression, and falls Referrals and appointments  In addition, I have reviewed and discussed with patient certain preventive protocols, quality metrics, and best practice recommendations. A written personalized care plan for preventive services as well as general preventive health recommendations were provided to patient.     Duard Brady Gerry Blanchfield, [place]   08/09/2022   Nurse Notes: none

## 2022-08-09 NOTE — Patient Instructions (Signed)
[name] [name] , Thank you for taking time to come for your Medicare Wellness Visit. I appreciate your ongoing commitment to your health goals. Please review the following plan we discussed and let me know if I can assist you in the future.   Screening recommendations/referrals: Colonoscopy: no longer needed Recommended yearly ophthalmology/optometry visit for glaucoma screening and checkup Recommended yearly dental visit for hygiene and checkup  Vaccinations: Influenza vaccine: up to date  Pneumococcal vaccine: up to date Tdap vaccine: up to date Shingles vaccine: up to date   Covid-19: up to date  Advanced directives: yes, not on file  Conditions/risks identified: see problem list   Next appointment: Follow up in one year for your annual wellness visit. 08/11/23  Preventive Care 65 Years and Older, Male Preventive care refers to lifestyle choices and visits with your health care provider that can promote health and wellness. What does preventive care include? A yearly physical exam. This is also called an annual well check. Dental exams once or twice a year. Routine eye exams. Ask your health care provider how often you should have your eyes checked. Personal lifestyle choices, including: Daily care of your teeth and gums. Regular physical activity. Eating a healthy diet. Avoiding tobacco and drug use. Limiting alcohol use. Practicing safe sex. Taking low doses of aspirin every day. Taking vitamin and mineral supplements as recommended by your health care provider. What happens during an annual well check? The services and screenings done by your health care provider during your annual well check will depend on your age, overall health, lifestyle risk factors, and family history of disease. Counseling  Your health care provider may ask you questions about your: Alcohol use. Tobacco use. Drug use. Emotional well-being. Home and relationship well-being. Sexual  activity. Eating habits. History of falls. Memory and ability to understand (cognition). Work and work Statistician. Screening  You may have the following tests or measurements: Height, weight, and BMI. Blood pressure. Lipid and cholesterol levels. These may be checked every 5 years, or more frequently if you are over 57 years old. Skin check. Lung cancer screening. You may have this screening every year starting at age 54 if you have a 30-pack-year history of smoking and currently smoke or have quit within the past 15 years. Fecal occult blood test (FOBT) of the stool. You may have this test every year starting at age 40. Flexible sigmoidoscopy or colonoscopy. You may have a sigmoidoscopy every 5 years or a colonoscopy every 10 years starting at age 45. Prostate cancer screening. Recommendations will vary depending on your family history and other risks. Hepatitis C blood test. Hepatitis B blood test. Sexually transmitted disease (STD) testing. Diabetes screening. This is done by checking your blood sugar (glucose) after you have not eaten for a while (fasting). You may have this done every 1-3 years. Abdominal aortic aneurysm (AAA) screening. You may need this if you are a current or former smoker. Osteoporosis. You may be screened starting at age 87 if you are at high risk. Talk with your health care provider about your test results, treatment options, and if necessary, the need for more tests. Vaccines  Your health care provider may recommend certain vaccines, such as: Influenza vaccine. This is recommended every year. Tetanus, diphtheria, and acellular pertussis (Tdap, Td) vaccine. You may need a Td booster every 10 years. Zoster vaccine. You may need this after age 26. Pneumococcal 13-valent conjugate (PCV13) vaccine. One dose is recommended after age 36. Pneumococcal polysaccharide (PPSV23) vaccine.  One dose is recommended after age 81. Talk to your health care provider about which  screenings and vaccines you need and how often you need them. This information is not intended to replace advice given to you by your health care provider. Make sure you discuss any questions you have with your health care provider. Document Released: 01/09/2016 Document Revised: 09/01/2016 Document Reviewed: 10/14/2015 Elsevier Interactive Patient Education  2017 [place] Prevention in the Home Falls can cause injuries. They can happen to people of all ages. There are many things you can do to make your home safe and to help prevent falls. What can I do on the outside of my home? Regularly fix the edges of walkways and driveways and fix any cracks. Remove anything that might make you trip as you walk through a door, such as a raised step or threshold. Trim any bushes or trees on the path to your home. Use bright outdoor lighting. Clear any walking paths of anything that might make someone trip, such as rocks or tools. Regularly check to see if handrails are loose or broken. Make sure that both sides of any steps have handrails. Any raised decks and porches should have guardrails on the edges. Have any leaves, snow, or ice cleared regularly. Use sand or salt on walking paths during winter. Clean up any spills in your garage right away. This includes oil or grease spills. What can I do in the bathroom? Use night lights. Install grab bars by the toilet and in the tub and shower. Do not use towel bars as grab bars. Use non-skid mats or decals in the tub or shower. If you need to sit down in the shower, use a plastic, non-slip stool. Keep the floor dry. Clean up any water that spills on the floor as soon as it happens. Remove soap buildup in the tub or shower regularly. Attach bath mats securely with double-sided non-slip rug tape. Do not have throw rugs and other things on the floor that can make you trip. What can I do in the bedroom? Use night lights. Make sure that you have a  light by your bed that is easy to reach. Do not use any sheets or blankets that are too big for your bed. They should not hang down onto the floor. Have a firm chair that has side arms. You can use this for support while you get dressed. Do not have throw rugs and other things on the floor that can make you trip. What can I do in the kitchen? Clean up any spills right away. Avoid walking on wet floors. Keep items that you use a lot in easy-to-reach places. If you need to reach something above you, use a strong step stool that has a grab bar. Keep electrical cords out of the way. Do not use floor polish or wax that makes floors slippery. If you must use wax, use non-skid floor wax. Do not have throw rugs and other things on the floor that can make you trip. What can I do with my stairs? Do not leave any items on the stairs. Make sure that there are handrails on both sides of the stairs and use them. Fix handrails that are broken or loose. Make sure that handrails are as long as the stairways. Check any carpeting to make sure that it is firmly attached to the stairs. Fix any carpet that is loose or worn. Avoid having throw rugs at the top or bottom of  the stairs. If you do have throw rugs, attach them to the floor with carpet tape. Make sure that you have a light switch at the top of the stairs and the bottom of the stairs. If you do not have them, ask someone to add them for you. What else can I do to help prevent falls? Wear shoes that: Do not have high heels. Have rubber bottoms. Are comfortable and fit you well. Are closed at the toe. Do not wear sandals. If you use a stepladder: Make sure that it is fully opened. Do not climb a closed stepladder. Make sure that both sides of the stepladder are locked into place. Ask someone to hold it for you, if possible. Clearly mark and make sure that you can see: Any grab bars or handrails. First and last steps. Where the edge of each step  is. Use tools that help you move around (mobility aids) if they are needed. These include: Canes. Walkers. Scooters. Crutches. Turn on the lights when you go into a dark area. Replace any light bulbs as soon as they burn out. Set up your furniture so you have a clear path. Avoid moving your furniture around. If any of your floors are uneven, fix them. If there are any pets around you, be aware of where they are. Review your medicines with your doctor. Some medicines can make you feel dizzy. This can increase your chance of falling. Ask your doctor what other things that you can do to help prevent falls. This information is not intended to replace advice given to you by your health care provider. Make sure you discuss any questions you have with your health care provider. Document Released: 10/09/2009 Document Revised: 05/20/2016 Document Reviewed: 01/17/2015 Elsevier Interactive Patient Education  2017 [employer].

## 2022-08-10 ENCOUNTER — Other Ambulatory Visit: Payer: Self-pay | Admitting: *Deleted

## 2022-08-10 ENCOUNTER — Ambulatory Visit (INDEPENDENT_AMBULATORY_CARE_PROVIDER_SITE_OTHER): Payer: Medicare Other | Admitting: Family Medicine

## 2022-08-10 ENCOUNTER — Ambulatory Visit: Payer: Medicare Other | Admitting: Internal Medicine

## 2022-08-10 DIAGNOSIS — I4821 Permanent atrial fibrillation: Secondary | ICD-10-CM | POA: Diagnosis not present

## 2022-08-10 DIAGNOSIS — E669 Obesity, unspecified: Secondary | ICD-10-CM

## 2022-08-10 DIAGNOSIS — G4733 Obstructive sleep apnea (adult) (pediatric): Secondary | ICD-10-CM | POA: Diagnosis not present

## 2022-08-10 DIAGNOSIS — B0229 Other postherpetic nervous system involvement: Secondary | ICD-10-CM | POA: Diagnosis not present

## 2022-08-10 DIAGNOSIS — E559 Vitamin D deficiency, unspecified: Secondary | ICD-10-CM

## 2022-08-10 DIAGNOSIS — E781 Pure hyperglyceridemia: Secondary | ICD-10-CM

## 2022-08-10 DIAGNOSIS — Z9989 Dependence on other enabling machines and devices: Secondary | ICD-10-CM

## 2022-08-10 DIAGNOSIS — I890 Lymphedema, not elsewhere classified: Secondary | ICD-10-CM | POA: Diagnosis not present

## 2022-08-10 DIAGNOSIS — R739 Hyperglycemia, unspecified: Secondary | ICD-10-CM

## 2022-08-10 NOTE — Progress Notes (Signed)
Opened in error

## 2022-08-10 NOTE — Assessment & Plan Note (Signed)
Wearing sleeve on right arm and it is helping

## 2022-08-10 NOTE — Progress Notes (Signed)
Subjective:   By signing my name below, I, Kellie Simmering, attest that this documentation has been prepared under the direction and in the presence of Mosie Lukes, MD 08/10/2022.    Patient ID: [name] [name], male    DOB: 28-Mar-1941, 81 y.o.   MRN: [id number]  No chief complaint on file.  HPI Patient is in today for an office visit.  Immunizations: He has been informed about receiving COVID-19 and RSV immunizations in the fall. He is up to date on his tetanus immunizations.   Gabapentin: He is currently taking Gabapentin 300 mg to manage his nerve pain.   Urology: He denies any urinary complications and has been following up with his urologist.  Vitamin D3: He is currently taking Vitamin D3 and is requesting a refill.   AFIB: He reports that he has AFIB that is controlled.   Eye: He is inquiring about a skin tag on his right eye lid. He states that he as an appointment scheduled in 12/23 with his ophthalmologist.   Past Medical History:  Diagnosis Date   Atrial fibrillation ([place])    BPH (benign prostatic hypertrophy)    Bradycardia 12/30/2014   HR routinely in mid 40s-50s   Coronary artery disease (CAD) excluded 03/2015   False-positive nuclear stress test suggesting inferior ischemia   Dysrhythmia    Elevated PSA    Encounter for Medicare annual wellness exam 09/22/2013   Sees Dr Wilhemina Bonito of Derm Sees Dr Silvano Rusk of Gastroenterology Sees Dr Kathie Rhodes of [facility] Sees Dr Susa Day of Optometry   GERD (gastroesophageal reflux disease)    controlled w/ diet and behavioral changes, history of   Grade I diastolic dysfunction 2025   History of kidney stones    Hypertriglyceridemia 03/16/2015   Kidney stones    Lymphadenitis    Melanoma of back ([place])    Excised Dr Wilhemina Bonito   Mild ascending aorta dilatation ([place])    New onset a-fib ([place]) 03/10/2015   OA (osteoarthritis)    Otitis, externa, infective 08/10/2015   Palpitations    Pneumonia  2004   Post herpetic neuralgia    Shingles 07/09/2013   Sleep apnea 07/30/2011   cpap- 13    Snoring disorder 07/30/2011   Past Surgical History:  Procedure Laterality Date   CATARACT EXTRACTION Right    COLONOSCOPY  2018   CYSTOSCOPY/URETEROSCOPY/HOLMIUM LASER/STENT PLACEMENT Left 10/05/2019   Procedure: CYSTOSCOPY/RETROGRADE/URETEROSCOPY/HOLMIUM LASER/STENT PLACEMENT;  Surgeon: Kathie Rhodes, MD;  Location: [place];  Service: Urology;  Laterality: Left;   EXTRACORPOREAL SHOCK WAVE LITHOTRIPSY Left 01/25/2022   Procedure: EXTRACORPOREAL SHOCK WAVE LITHOTRIPSY (ESWL);  Surgeon: Raynelle Bring, MD;  Location: [facility];  Service: Urology;  Laterality: Left;   EYE SURGERY Left 12/06/2017   cataract by Dr Logan Bores HERNIA REPAIR Bilateral 06/01/2019   Procedure: LAPAROSCOPIC LEFT AND  RIGHT INGUINAL HERNIA REPAIR WITH MESH;  Surgeon: Michael Boston, MD;  Location: [place];  Service: General;  Laterality: Bilateral;   LEFT HEART CATHETERIZATION WITH CORONARY ANGIOGRAM N/A 04/10/2015   Procedure: LEFT HEART CATHETERIZATION WITH  CORONARY ANGIOGRAM;  Surgeon: Leonie Man, MD;  Location: [facility] CATH [facility];  Service: Cardiovascular;  Angiographicallly NORMAL CORONARY ARTERIES   MASS EXCISION N/A 05/16/2014   Procedure: EXCISION POSTERIOR NECK MASS, RIGHT CHEST WALL MASS AND RIGHT AXILLARY MASS AXILLARY NODE DISSECTION;  Surgeon: Adin Hector, MD;  Location: [facility];  Service: General;  Laterality: N/A;  melanoma removal     MOUTH SURGERY N/A    NM MYOVIEW LTD  03/19/2015   FALSE POSITIVE:  INTERMEDIATE RISK. Small sized, moderate intensity inferior ischemic perfusion defect   REVERSE SHOULDER ARTHROPLASTY Left 10/16/2020   Procedure: REVERSE SHOULDER ARTHROPLASTY;  Surgeon: Justice Britain, MD;  Location: [facility];  Service: Orthopedics;  Laterality: Left;  165mn   TRANSTHORACIC ECHOCARDIOGRAM  03/20/2015   Normal LV size and function.  EF 60-65%. G1 DD. Trivial AI. Borderline aortic root dilation (41 mm), Mild LA dilation.   VEIN LIGATION AND STRIPPING Left 07/13/2021   Procedure: LIGATION AND STRIPPING OF LEFT GREAT SAPHENOUS VEIN WITH STAB PHLEBECTOMY GREATER THAN 20 INCISIONS TO LEFT LEG;  Surgeon: DAngelia Mould MD;  [facility];  Service: Vascular;  Laterality: Left;   Family History  Problem Relation Age of Onset   Cancer Mother 666      MM, leukemia   Obesity Mother    Emphysema Father 62  Obesity Father    Alcohol abuse Father    Cancer Sister 580      brain   Diabetes Sister 736  Obesity Sister    COPD Brother 776  Down syndrome Brother 562      aspirated   Diabetes Son    Pancreatitis Son    Heart disease Paternal Grandmother    COPD Paternal Grandfather    Alcohol abuse Other    Colon cancer Neg Hx    Social History   Socioeconomic History   Marital status: Married    Spouse name: [name]   Number of children: 2   Years of education: Not on file   Highest education level: Not on file  Occupational History   Occupation: retired    Comment: truck driver  Tobacco Use   Smoking status: Former    Types: Pipe, Cigars    Quit date: 02/11/1989    Years since quitting: 33.5   Smokeless tobacco: Never  Vaping Use   Vaping Use: Never used  Substance and Sexual Activity   Alcohol use: Yes    Alcohol/week: 7.0 standard drinks of alcohol    Types: 7 Glasses of wine per week    Comment: 1 per day -- brandy or wine   Drug use: No   Sexual activity: Yes    Comment: lives with wife, 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 [place] daughter KErasmo Downerand her family. 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 He quit smoking in 1990. He has 6-7 glasses of brandy or wine a week.    He exercises regularly with low impact aerobics 2 days a week and daily walks of 30-45 minutes. His exercise sessions or 60 minutes. He will do some type of exercise at least 7 days a week and is very active doing yard work and wood work.      Patient is right-handed. He lives with his wife in a one level home with a basement. He drinks 4-5 cups of decaf coffee a day. He and his wife go to  the gym 1-2 x a week.   Social Determinants of Health   Financial Resource Strain: Low Risk  (08/05/2021)   Overall Financial Resource Strain (CARDIA)    Difficulty of Paying Living Expenses: Not hard at all  Food Insecurity: No Food Insecurity (08/05/2021)   Hunger Vital Sign    Worried About Running Out of Food in the Last Year: Never true    Ran Out of Food in the Last Year: Never true  Transportation Needs: No Transportation Needs (08/05/2021)   PRAPARE - Hydrologist (Medical): No    Lack of Transportation (Non-Medical): No  Physical Activity: Inactive (08/05/2021)   Exercise Vital Sign    Days of Exercise per Week: 0 days    Minutes of Exercise per Session: 0 min  Stress: No Stress Concern Present (08/05/2021)   [place]    Feeling of Stress : Not at all  Social Connections: Moderately Isolated (08/05/2021)   Social Connection and Isolation Panel [NHANES]    Frequency of Communication with Friends and Family: More than three times a week    Frequency of Social Gatherings with Friends and Family: More than three times a week    Attends Religious Services: Never    Marine scientist or Organizations: No    Attends Archivist Meetings: Never    Marital Status: Married  Human resources officer Violence: Not At Risk (08/05/2021)   Humiliation, Afraid, Rape,  and Kick questionnaire    Fear of Current or Ex-Partner: No    Emotionally Abused: No    Physically Abused: No    Sexually Abused: No   Outpatient Medications Prior to Visit  Medication Sig Dispense Refill   Cholecalciferol (VITAMIN D3) 125 MCG (5000 UT) CAPS Take 1 capsule (5,000 Units total) by mouth daily. (Patient taking differently: Take 5,000 Units by mouth daily.) 30 capsule 0   ELIQUIS 5 MG TABS tablet Take 5 mg by mouth 2 (two) times daily.     gabapentin (NEURONTIN) 300 MG capsule TAKE 3 CAPSULES BY MOUTH THREE TIMES DAILY 270 capsule 0   OVER THE COUNTER MEDICATION Apply 1 application topically daily as needed (Pain). Hemp freeze     sildenafil (VIAGRA) 100 MG tablet Take 100 mg by mouth daily as needed for erectile dysfunction.     tamsulosin (FLOMAX) 0.4 MG CAPS capsule Take 1 capsule (0.4 mg total) by mouth at bedtime. 14 capsule 0   No facility-administered medications prior to visit.   No Known Allergies  Review of Systems  Genitourinary:  Negative for dysuria, flank pain, frequency, hematuria and urgency.      Objective:    Physical Exam Constitutional:      General: He is not in acute distress.    Appearance: Normal appearance. He is not ill-appearing.  HENT:     Head: Normocephalic and atraumatic.     Right Ear: External ear normal.     Left Ear: External ear normal.     Mouth/Throat:     Mouth: Mucous membranes are moist.     Pharynx: Oropharynx is clear.  Eyes:     Extraocular Movements: Extraocular movements intact.     Pupils: Pupils are equal, round, and reactive to light.     Comments: (+) Small cholesterol deposit on right eye lid.   Neck:     Vascular: No carotid bruit.  Cardiovascular:     Rate and Rhythm: Normal rate and  regular rhythm.     Pulses: Normal pulses.     Heart sounds: Normal heart sounds. No murmur heard.    No gallop.  Pulmonary:     Effort: Pulmonary effort is normal. No respiratory distress.     Breath sounds: Normal  breath sounds. No wheezing or rales.  Abdominal:     General: Bowel sounds are normal.  Lymphadenopathy:     Cervical: No cervical adenopathy.  Skin:    General: Skin is warm and dry.  Neurological:     Mental Status: He is alert and oriented to person, place, and time.  Psychiatric:        Mood and Affect: Mood normal.        Behavior: Behavior normal.        Judgment: Judgment normal.    There were no vitals taken for this visit. Wt Readings from Last 3 Encounters:  08/09/22 224 lb (101.6 kg)  06/15/22 212 lb 9.6 oz (96.4 kg)  04/22/22 212 lb (96.2 kg)   Diabetic Foot Exam - Simple   No data filed    Lab Results  Component Value Date   WBC 10.6 (H) 12/29/2021   HGB 15.5 12/29/2021   HCT 44.1 12/29/2021   PLT 177 12/29/2021   GLUCOSE 95 12/29/2021   CHOL 174 11/30/2021   TRIG 68 11/30/2021   HDL 62 11/30/2021   LDLCALC 99 11/30/2021   ALT 18 11/30/2021   AST 20 11/30/2021   NA 137 12/29/2021   K 4.1 12/29/2021   CL 104 12/29/2021   CREATININE 1.06 12/29/2021   BUN 24 (H) 12/29/2021   CO2 22 12/29/2021   TSH 2.840 11/30/2021   PSA 4.94 (H) 01/15/2010   INR 1.22 04/04/2015   HGBA1C 6.0 (H) 11/30/2021   Lab Results  Component Value Date   TSH 2.840 11/30/2021   Lab Results  Component Value Date   WBC 10.6 (H) 12/29/2021   HGB 15.5 12/29/2021   HCT 44.1 12/29/2021   MCV 93.8 12/29/2021   PLT 177 12/29/2021   Lab Results  Component Value Date   NA 137 12/29/2021   K 4.1 12/29/2021   CO2 22 12/29/2021   GLUCOSE 95 12/29/2021   BUN 24 (H) 12/29/2021   CREATININE 1.06 12/29/2021   BILITOT 0.8 11/30/2021   ALKPHOS 76 11/30/2021   AST 20 11/30/2021   ALT 18 11/30/2021   PROT 6.7 11/30/2021   ALBUMIN 4.3 11/30/2021   CALCIUM 9.2 12/29/2021   ANIONGAP 11 12/29/2021   EGFR 84 11/30/2021   GFR 80.84 10/11/2019   Lab Results  Component Value Date   CHOL 174 11/30/2021   Lab Results  Component Value Date   HDL 62 11/30/2021   Lab Results   Component Value Date   LDLCALC 99 11/30/2021   Lab Results  Component Value Date   TRIG 68 11/30/2021   Lab Results  Component Value Date   CHOLHDL 3 10/11/2019   Lab Results  Component Value Date   HGBA1C 6.0 (H) 11/30/2021      Assessment & Plan:   Problem List Items Addressed This Visit   None  No orders of the defined types were placed in this encounter.  I, Kellie Simmering, personally preformed the services described in this documentation.  All medical record entries made by the scribe were at my direction and in my presence.  I have reviewed the chart and discharge instructions (if applicable) and agree that the record reflects my personal  performance and is accurate and complete. 08/10/2022  I,Mohammed Iqbal,acting as a scribe for [name], MD.,have documented all relevant documentation on the behalf of [name], MD,as directed by  [name], MD while in the presence of [name], MD.  Kellie Simmering

## 2022-08-10 NOTE — Assessment & Plan Note (Signed)
Encourage heart healthy diet such as MIND or DASH diet, increase exercise, avoid trans fats, simple carbohydrates and processed foods, consider a krill or fish or flaxseed oil cap daily.  °

## 2022-08-10 NOTE — Assessment & Plan Note (Signed)
Maintain DASH or MIND diet, decrease po intake and increase exercise as tolerated. Needs 7-8 hours of sleep nightly. Avoid trans fats, eat small, frequent meals every 4-5 hours with lean proteins, complex carbs and healthy fats. Minimize simple carbs, high fat foods and processed foods  

## 2022-08-10 NOTE — Assessment & Plan Note (Signed)
Supplement and monitor 

## 2022-08-10 NOTE — Patient Instructions (Addendum)
Yerba Matte Tea do not drink    Atrial Fibrillation  Atrial fibrillation is a type of irregular or rapid heartbeat (arrhythmia). In atrial fibrillation, the top part of the heart (atria) beats in an irregular pattern. This makes the heart unable to pump blood normally and effectively. The goal of treatment is to prevent blood clots from forming, control your heart rate, or restore your heartbeat to a normal rhythm. If this condition is not treated, it can cause serious problems, such as a weakened heart muscle (cardiomyopathy) or a stroke. What are the causes? This condition is often caused by medical conditions that damage the heart's electrical system. These include: High blood pressure (hypertension). This is the most common cause. Certain heart problems or conditions, such as heart failure, coronary artery disease, heart valve problems, or heart surgery. Diabetes. Overactive thyroid (hyperthyroidism). Obesity. Chronic kidney disease. In some cases, the cause of this condition is not known. What increases the risk? This condition is more likely to develop in: Older people. People who smoke. Athletes who do endurance exercise. People who have a family history of atrial fibrillation. Men. People who use drugs. People who drink a lot of alcohol. People who have lung conditions, such as emphysema, pneumonia, or COPD. People who have obstructive sleep apnea. What are the signs or symptoms? Symptoms of this condition include: A feeling that your heart is racing or beating irregularly. Discomfort or pain in your chest. Shortness of breath. Sudden light-headedness or weakness. Tiring easily during exercise or activity. Fatigue. Syncope (fainting). Sweating. In some cases, there are no symptoms. How is this diagnosed? Your health care provider may detect atrial fibrillation when taking your pulse. If detected, this condition may be diagnosed with: An electrocardiogram (ECG) to check  electrical signals of the heart. An ambulatory cardiac monitor to record your heart's activity for a few days. A transthoracic echocardiogram (TTE) to create pictures of your heart. A transesophageal echocardiogram (TEE) to create even closer pictures of your heart. A stress test to check your blood supply while you exercise. Imaging tests, such as a CT scan or chest X-ray. Blood tests. How is this treated? Treatment depends on underlying conditions and how you feel when you experience atrial fibrillation. This condition may be treated with: Medicines to prevent blood clots or to treat heart rate or heart rhythm problems. Electrical cardioversion to reset the heart's rhythm. A pacemaker to correct abnormal heart rhythm. Ablation to remove the heart tissue that sends abnormal signals. Left atrial appendage closure to seal the area where blood clots can form. In some cases, underlying conditions will be treated. Follow these instructions at home: Medicines Take over-the counter and prescription medicines only as told by your health care provider. Do not take any new medicines without talking to your health care provider. If you are taking blood thinners: Talk with your health care provider before you take any medicines that contain aspirin or NSAIDs, such as ibuprofen. These medicines increase your risk for dangerous bleeding. Take your medicine exactly as told, at the same time every day. Avoid activities that could cause injury or bruising, and follow instructions about how to prevent falls. Wear a medical alert bracelet or carry a card that lists what medicines you take. Lifestyle     Do not use any products that contain nicotine or tobacco, such as cigarettes, e-cigarettes, and chewing tobacco. If you need help quitting, ask your health care provider. Eat heart-healthy foods. Talk with a dietitian to make an eating plan  that is right for you. Exercise regularly as told by your health  care provider. Do not drink alcohol. Lose weight if you are overweight. Do not use drugs, including cannabis. General instructions If you have obstructive sleep apnea, manage your condition as told by your health care provider. Do not use diet pills unless your health care provider approves. Diet pills can make heart problems worse. Keep all follow-up visits as told by your health care provider. This is important. Contact a health care provider if you: Notice a change in the rate, rhythm, or strength of your heartbeat. Are taking a blood thinner and you notice more bruising. Tire more easily when you exercise or do heavy work. Have a sudden change in weight. Get help right away if you have:  Chest pain, abdominal pain, sweating, or weakness. Trouble breathing. Side effects of blood thinners, such as blood in your vomit, stool, or urine, or bleeding that cannot stop. Any symptoms of a stroke. "BE FAST" is an easy way to remember the main warning signs of a stroke: B - Balance. Signs are dizziness, sudden trouble walking, or loss of balance. E - Eyes. Signs are trouble seeing or a sudden change in vision. F - Face. Signs are sudden weakness or numbness of the face, or the face or eyelid drooping on one side. A - Arms. Signs are weakness or numbness in an arm. This happens suddenly and usually on one side of the body. S - Speech. Signs are sudden trouble speaking, slurred speech, or trouble understanding what people say. T - Time. Time to call emergency services. Write down what time symptoms started. Other signs of a stroke, such as: A sudden, severe headache with no known cause. Nausea or vomiting. Seizure. These symptoms may represent a serious problem that is an emergency. Do not wait to see if the symptoms will go away. Get medical help right away. Call your local emergency services (911 in the [place] Do not drive yourself to the hospital. Summary Atrial fibrillation is a type of  irregular or rapid heartbeat (arrhythmia). Symptoms include a feeling that your heart is beating fast or irregularly. You may be given medicines to prevent blood clots or to treat heart rate or heart rhythm problems. Get help right away if you have signs or symptoms of a stroke. Get help right away if you cannot catch your breath or have chest pain or pressure. This information is not intended to replace advice given to you by your health care provider. Make sure you discuss any questions you have with your health care provider. Document Revised: 06/06/2019 Document Reviewed: 06/06/2019 Elsevier Patient Education  [place].

## 2022-08-10 NOTE — Assessment & Plan Note (Signed)
Rate controlled. Is in a clinical trial with [facility] and the is unclear if he is on Eliquis or the study medicine for the next 2-3 years.

## 2022-08-11 ENCOUNTER — Other Ambulatory Visit: Payer: Self-pay | Admitting: Family Medicine

## 2022-08-11 ENCOUNTER — Encounter: Payer: Self-pay | Admitting: Internal Medicine

## 2022-08-11 LAB — COMPREHENSIVE METABOLIC PANEL
ALT: 18 IU/L (ref 0–44)
AST: 18 IU/L (ref 0–40)
Albumin/Globulin Ratio: 1.6 (ref 1.2–2.2)
Albumin: 4.1 g/dL (ref 3.8–4.8)
Alkaline Phosphatase: 59 IU/L (ref 44–121)
BUN/Creatinine Ratio: 28 — ABNORMAL HIGH (ref 10–24)
BUN: 28 mg/dL — ABNORMAL HIGH (ref 8–27)
Bilirubin Total: 0.7 mg/dL (ref 0.0–1.2)
CO2: 24 mmol/L (ref 20–29)
Calcium: 9.3 mg/dL (ref 8.6–10.2)
Chloride: 106 mmol/L (ref 96–106)
Creatinine, Ser: 0.99 mg/dL (ref 0.76–1.27)
Globulin, Total: 2.6 g/dL (ref 1.5–4.5)
Glucose: 83 mg/dL (ref 70–99)
Potassium: 5 mmol/L (ref 3.5–5.2)
Sodium: 142 mmol/L (ref 134–144)
Total Protein: 6.7 g/dL (ref 6.0–8.5)
eGFR: 77 mL/min/{1.73_m2} (ref >59)

## 2022-08-11 LAB — CBC
Hematocrit: 43.7 % (ref 37.5–51.0)
Hemoglobin: 14.5 g/dL (ref 13.0–17.7)
MCH: 32.6 pg (ref 26.6–33.0)
MCHC: 33.2 g/dL (ref 31.5–35.7)
MCV: 98 fL — ABNORMAL HIGH (ref 79–97)
Platelets: 185 10*3/uL (ref 150–450)
RBC: 4.45 x10E6/uL (ref 4.14–5.80)
RDW: 13.2 % (ref 11.6–15.4)
WBC: 6.8 10*3/uL (ref 3.4–10.8)

## 2022-08-11 LAB — LIPID PANEL
Chol/HDL Ratio: 2.5 ratio (ref 0.0–5.0)
Cholesterol, Total: 142 mg/dL (ref 100–199)
HDL: 56 mg/dL (ref >39)
LDL Chol Calc (NIH): 74 mg/dL (ref 0–99)
Triglycerides: 58 mg/dL (ref 0–149)
VLDL Cholesterol Cal: 12 mg/dL (ref 5–40)

## 2022-08-11 LAB — HEMOGLOBIN A1C
Est. average glucose Bld gHb Est-mCnc: 117 mg/dL
Hgb A1c MFr Bld: 5.7 % — ABNORMAL HIGH (ref 4.8–5.6)

## 2022-08-11 LAB — TSH: TSH: 2.04 u[IU]/mL (ref 0.450–4.500)

## 2022-08-11 LAB — VITAMIN D 25 HYDROXY (VIT D DEFICIENCY, FRACTURES): Vit D, 25-Hydroxy: 73.1 ng/mL (ref 30.0–100.0)

## 2022-08-11 NOTE — Assessment & Plan Note (Signed)
hgba1c acceptable, minimize simple carbs. Increase exercise as tolerated.  

## 2022-08-11 NOTE — Assessment & Plan Note (Signed)
Continues to struggle with daily pain but manages adequately with Gabapentin

## 2022-08-11 NOTE — Assessment & Plan Note (Signed)
Using CPAP 

## 2022-08-11 NOTE — Progress Notes (Signed)
08/10/21- 5 yoM M former pipe smoker for sleep evaluation courtesy of Dr [name] with concern of OSA on CPAP Medical problem list includes HTN, CAD, Gr 1 DD, GERD, Melanoma, Obesity,  NPSG 11/13/13- AHI 37/ hr, desaturation to 81%, CPAP to 13, body weight 220 lbs CPAP  13 / [facility] Download- compliance 100%, AHI 4.6/ hr Epworth score-11 Body weight today-219 lbs Covid vax-4 [employer] Machine giving motor end of life message, but still runs ok. He never sleeps without it, doubts he could. Asked about [name] [name] but decided he was too comfortable with CPAP to consider surgery.  Breathing ok. Keeps elastic sleeve on R lower arm since lipoma ressected.  08/12/22-  81 yoM former pipe smoker followed for OSA, complicated by HTN, CAD, Gr 1 DD, PAFib, GERD, Melanoma, Obesity, Varicose veins, Kidney Stone,  CPAP autyo 5-15/ [facility]    replacement machine ordered 08/10/21 Download compliance-100%, AHI 13.8/ hr     mostly centrals Body weight today-223 lbs Covid vax- -----Pt f/u getting approx 7-8hrs/night plus some scattered napping w/o CPAP. Pt believes machine is working well, no questions or concerns Wife indicates she is comfortable with what she can hear at night. Takes an occasional nap but thinks he is sleeping well. Cardiology is following for atrial fibrillation.  ROS-see HPI   + = positive Constitutional:    weight loss, night sweats, fevers, chills, fatigue, lassitude. HEENT:    headaches, difficulty swallowing, tooth/dental problems, sore throat,       sneezing, itching, ear ache, nasal congestion, post nasal drip, snoring CV:    chest pain, orthopnea, PND, swelling in lower extremities, anasarca,                                  dizziness, palpitations Resp:   shortness of breath with exertion or at rest.                productive cough,   non-productive cough, coughing up of blood.              change in color of mucus.  wheezing.   Skin:    rash or lesions. GI:  No-   heartburn,  indigestion, abdominal pain, nausea, vomiting, diarrhea,                 change in bowel habits, loss of appetite GU: dysuria, change in color of urine, no urgency or frequency.   flank pain. MS:   joint pain, stiffness, decreased range of motion, back pain. Neuro-     nothing unusual Psych:  change in mood or affect.  depression or anxiety.   memory loss.  OBJ- Physical Exam General- Alert, Oriented, Affect-appropriate, Distress- none acute, +overweight Skin- rash-none, lesions- none, excoriation- none Lymphadenopathy- none Head- atraumatic            Eyes- Gross vision intact, PERRLA, conjunctivae and secretions clear            Ears- Hearing, canals-normal            Nose- Clear, no-Septal dev, mucus, polyps, erosion, perforation             Throat- Mallampati II-III , mucosa clear , drainage- none, tonsils- atrophic, + teeth Neck- flexible , trachea midline, no stridor , thyroid nl, carotid no bruit Chest - symmetrical excursion , unlabored           Heart/CV- RRR , no murmur , no  gallop  , no rub, nl s1 s2                           - JVD- none , edema- none, stasis changes- none, varices- none           Lung- clear to P&A, wheeze- none, cough- none , dullness-none, rub- none           Chest wall-  Abd-  Br/ Gen/ Rectal- Not done, not indicated Extrem- +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

## 2022-08-12 ENCOUNTER — Encounter: Payer: Self-pay | Admitting: Internal Medicine

## 2022-08-12 ENCOUNTER — Ambulatory Visit: Payer: Medicare Other | Admitting: Internal Medicine

## 2022-08-12 DIAGNOSIS — I4821 Permanent atrial fibrillation: Secondary | ICD-10-CM

## 2022-08-12 DIAGNOSIS — G4733 Obstructive sleep apnea (adult) (pediatric): Secondary | ICD-10-CM | POA: Diagnosis not present

## 2022-08-12 DIAGNOSIS — Z9989 Dependence on other enabling machines and devices: Secondary | ICD-10-CM

## 2022-08-12 NOTE — Patient Instructions (Signed)
We can continue CPAP auto 5-15 ° °Please call if we can help °

## 2022-09-01 NOTE — Assessment & Plan Note (Signed)
Benefits from CPAP with good compliance.  There are residual central apneas which will not respond to CPAP.  He feels he is sleeping well. Plan- Continue auto 5-15. Watch central apneas on future downloads.

## 2022-09-01 NOTE — Assessment & Plan Note (Signed)
Managed by cardiology. Discussed central sleep apnea events as reflecting cerebral perfusion with feedback delay.

## 2022-09-02 DIAGNOSIS — M5137 Other intervertebral disc degeneration, lumbosacral region: Secondary | ICD-10-CM | POA: Diagnosis not present

## 2022-09-02 DIAGNOSIS — M5416 Radiculopathy, lumbar region: Secondary | ICD-10-CM | POA: Diagnosis not present

## 2022-09-02 DIAGNOSIS — M9902 Segmental and somatic dysfunction of thoracic region: Secondary | ICD-10-CM | POA: Diagnosis not present

## 2022-09-02 DIAGNOSIS — M9903 Segmental and somatic dysfunction of lumbar region: Secondary | ICD-10-CM | POA: Diagnosis not present

## 2022-09-02 DIAGNOSIS — M9905 Segmental and somatic dysfunction of pelvic region: Secondary | ICD-10-CM | POA: Diagnosis not present

## 2022-09-09 DIAGNOSIS — M9902 Segmental and somatic dysfunction of thoracic region: Secondary | ICD-10-CM | POA: Diagnosis not present

## 2022-09-09 DIAGNOSIS — M9903 Segmental and somatic dysfunction of lumbar region: Secondary | ICD-10-CM | POA: Diagnosis not present

## 2022-09-09 DIAGNOSIS — M5137 Other intervertebral disc degeneration, lumbosacral region: Secondary | ICD-10-CM | POA: Diagnosis not present

## 2022-09-09 DIAGNOSIS — M5416 Radiculopathy, lumbar region: Secondary | ICD-10-CM | POA: Diagnosis not present

## 2022-09-09 DIAGNOSIS — M9905 Segmental and somatic dysfunction of pelvic region: Secondary | ICD-10-CM | POA: Diagnosis not present

## 2022-09-12 DIAGNOSIS — G4733 Obstructive sleep apnea (adult) (pediatric): Secondary | ICD-10-CM | POA: Diagnosis not present

## 2022-09-13 ENCOUNTER — Encounter (INDEPENDENT_AMBULATORY_CARE_PROVIDER_SITE_OTHER): Payer: Self-pay | Admitting: Family Medicine

## 2022-09-13 ENCOUNTER — Ambulatory Visit (INDEPENDENT_AMBULATORY_CARE_PROVIDER_SITE_OTHER): Payer: Medicare Other | Admitting: Family Medicine

## 2022-09-13 VITALS — BP 128/80 | HR 62 | Temp 97.5°F | Ht 69.0 in | Wt 210.0 lb

## 2022-09-13 DIAGNOSIS — Z6831 Body mass index (BMI) 31.0-31.9, adult: Secondary | ICD-10-CM

## 2022-09-13 DIAGNOSIS — E860 Dehydration: Secondary | ICD-10-CM | POA: Diagnosis not present

## 2022-09-13 DIAGNOSIS — R718 Other abnormality of red blood cells: Secondary | ICD-10-CM | POA: Diagnosis not present

## 2022-09-13 DIAGNOSIS — R7303 Prediabetes: Secondary | ICD-10-CM | POA: Diagnosis not present

## 2022-09-13 DIAGNOSIS — E559 Vitamin D deficiency, unspecified: Secondary | ICD-10-CM

## 2022-09-13 DIAGNOSIS — E669 Obesity, unspecified: Secondary | ICD-10-CM

## 2022-09-13 DIAGNOSIS — E66812 Obesity, class 2: Secondary | ICD-10-CM

## 2022-09-14 LAB — CBC WITH DIFFERENTIAL/PLATELET
Basophils Absolute: 0 10*3/uL (ref 0.0–0.2)
Basos: 1 %
EOS (ABSOLUTE): 0.1 10*3/uL (ref 0.0–0.4)
Eos: 1 %
Hematocrit: 46.7 % (ref 37.5–51.0)
Hemoglobin: 15.7 g/dL (ref 13.0–17.7)
Immature Grans (Abs): 0 10*3/uL (ref 0.0–0.1)
Immature Granulocytes: 0 %
Lymphocytes Absolute: 1.3 10*3/uL (ref 0.7–3.1)
Lymphs: 24 %
MCH: 32.6 pg (ref 26.6–33.0)
MCHC: 33.6 g/dL (ref 31.5–35.7)
MCV: 97 fL (ref 79–97)
Monocytes Absolute: 0.5 10*3/uL (ref 0.1–0.9)
Monocytes: 9 %
Neutrophils Absolute: 3.4 10*3/uL (ref 1.4–7.0)
Neutrophils: 65 %
Platelets: 177 10*3/uL (ref 150–450)
RBC: 4.82 x10E6/uL (ref 4.14–5.80)
RDW: 13 % (ref 11.6–15.4)
WBC: 5.3 10*3/uL (ref 3.4–10.8)

## 2022-09-14 LAB — CMP14+EGFR
ALT: 17 IU/L (ref 0–44)
AST: 20 IU/L (ref 0–40)
Albumin/Globulin Ratio: 1.6 (ref 1.2–2.2)
Albumin: 4.2 g/dL (ref 3.8–4.8)
Alkaline Phosphatase: 64 IU/L (ref 44–121)
BUN/Creatinine Ratio: 18 (ref 10–24)
BUN: 20 mg/dL (ref 8–27)
Bilirubin Total: 0.9 mg/dL (ref 0.0–1.2)
CO2: 23 mmol/L (ref 20–29)
Calcium: 9.6 mg/dL (ref 8.6–10.2)
Chloride: 101 mmol/L (ref 96–106)
Creatinine, Ser: 1.11 mg/dL (ref 0.76–1.27)
Globulin, Total: 2.6 g/dL (ref 1.5–4.5)
Glucose: 71 mg/dL (ref 70–99)
Potassium: 4.6 mmol/L (ref 3.5–5.2)
Sodium: 139 mmol/L (ref 134–144)
Total Protein: 6.8 g/dL (ref 6.0–8.5)
eGFR: 68 mL/min/{1.73_m2} (ref >59)

## 2022-09-14 LAB — VITAMIN B12: Vitamin B-12: 787 pg/mL (ref 232–1245)

## 2022-09-14 NOTE — Progress Notes (Unsigned)
Chief Complaint:   OBESITY [name] [name] is here to discuss his progress with his obesity treatment plan along with follow-up of his obesity related diagnoses. [name] [name] is on the Category 3 Plan and states he is following his eating plan approximately 80% of the time. [name] [name] states he is doing yard work and walking for 30 minutes 4-5 times per week.  Today's visit was #: 62 Starting weight: 237 lbs Starting date: 08/18/2017 Today's weight: 210 lbs Today's date: 09/13/2022 Total lbs lost to date: 27 Total lbs lost since last in-office visit: 2  Interim History: [name] [name] is not always meeting his protein goals, and he is trying to supplement with 3 glasses a day of fat-free fair life milk and cottage cheese.  Subjective:   1. Dehydration [name] [name]'s BUN is slightly elevated at 28 on his labs in August.  2. Prediabetes [name] [name]'s last A1c decreased to 0.7 from 6.0.  3. Vitamin D deficiency [name] [name]'s last vitamin D level was at goal.  4. Elevated MCV [name] [name]'s remainder of CBC appears normal, may need supplementation.  Assessment/Plan:   1. Dehydration Discussed ways to hydrate and avoid caffeinated beverages.  Discussed strategies to maintain good hydration.  [name] [name] was encouraged to increase his water intake by 25%.  2. Prediabetes [name] [name] will continue with his diet, exercise, and decreasing simple carbohydrates to help decrease the risk of diabetes.   3. Vitamin D deficiency [name] [name] will continue Vitamin D 5,000 IU daily and will follow-up for routine testing of Vitamin D, at least 2-3 times per year to avoid over-replacement.  4. Elevated MCV We will check labs today, and we will follow-up at [name] [name]'s next visit.  - Vitamin B12 - CBC with Differential/Platelet - CMP14+EGFR  5. Obesity, Current BMI 31.0 [name] [name] is currently in the action stage of change. As such, his goal is to continue with weight loss efforts. He has agreed to the Category 3 Plan.   Discussed with the patient not weighing  daily.  Exercise goals: For substantial health benefits, adults should do at least 150 minutes (2 hours and 30 minutes) a week of moderate-intensity, or 75 minutes (1 hour and 15 minutes) a week of vigorous-intensity aerobic physical activity, or an equivalent combination of moderate- and vigorous-intensity aerobic activity. Aerobic activity should be performed in episodes of at least 10 minutes, and preferably, it should be spread throughout the week.  Behavioral modification strategies: increasing lean protein intake and increasing water intake.  [name] [name] has agreed to follow-up with our clinic in 8 weeks. He was informed of the importance of frequent follow-up visits to maximize his success with intensive lifestyle modifications for his multiple health conditions.   [name] [name] was informed we would discuss his lab results at his next visit unless there is a critical issue that needs to be addressed sooner. [name] [name] agreed to keep his next visit at the agreed upon time to discuss these results.  Objective:   Blood pressure 128/80, pulse 62, temperature (!) 97.5 F (36.4 C), height '5\' 9"'  (1.753 m), weight 210 lb (95.3 kg), SpO2 99 %. Body mass index is 31.01 kg/m.  General: Cooperative, alert, well developed, in no acute distress. HEENT: Conjunctivae and lids unremarkable. Cardiovascular: Regular rhythm.  Lungs: Normal work of breathing. Neurologic: No focal deficits.   Lab Results  Component Value Date   CREATININE 1.11 09/13/2022   BUN 20 09/13/2022   NA 139 09/13/2022   K 4.6 09/13/2022   CL 101 09/13/2022   CO2 23 09/13/2022  Lab Results  Component Value Date   ALT 17 09/13/2022   AST 20 09/13/2022   ALKPHOS 64 09/13/2022   BILITOT 0.9 09/13/2022   Lab Results  Component Value Date   HGBA1C 5.7 (H) 08/10/2022   HGBA1C 6.0 (H) 11/30/2021   HGBA1C 5.6 06/22/2021   HGBA1C 5.7 (H) 12/03/2020   HGBA1C 5.5 07/23/2020   Lab Results  Component Value Date   INSULIN 6.6 11/30/2021    INSULIN 6.2 06/22/2021   INSULIN 5.1 12/03/2020   INSULIN 5.2 07/23/2020   INSULIN 5.1 04/21/2020   Lab Results  Component Value Date   TSH 2.040 08/10/2022   Lab Results  Component Value Date   CHOL 142 08/10/2022   HDL 56 08/10/2022   LDLCALC 74 08/10/2022   TRIG 58 08/10/2022   CHOLHDL 2.5 08/10/2022   Lab Results  Component Value Date   VD25OH 73.1 08/10/2022   VD25OH 70.2 04/22/2022   VD25OH 69.3 11/30/2021   Lab Results  Component Value Date   WBC 5.3 09/13/2022   HGB 15.7 09/13/2022   HCT 46.7 09/13/2022   MCV 97 09/13/2022   PLT 177 09/13/2022   No results found for: "IRON", "TIBC", "FERRITIN"  Attestation Statements:   Reviewed by clinician on day of visit: allergies, medications, problem list, medical history, surgical history, family history, social history, and previous encounter notes.  I have personally spent 40 minutes total time today in preparation, patient care, and documentation for this visit, including the following: review of clinical lab tests; review of medical tests/procedures/services.   I, Trixie Dredge, am acting as transcriptionist for Dennard Nip, MD.  I have reviewed the above documentation for accuracy and completeness, and I agree with the above. -  Dennard Nip, MD

## 2022-09-15 DIAGNOSIS — M9905 Segmental and somatic dysfunction of pelvic region: Secondary | ICD-10-CM | POA: Diagnosis not present

## 2022-09-15 DIAGNOSIS — M9902 Segmental and somatic dysfunction of thoracic region: Secondary | ICD-10-CM | POA: Diagnosis not present

## 2022-09-15 DIAGNOSIS — M5416 Radiculopathy, lumbar region: Secondary | ICD-10-CM | POA: Diagnosis not present

## 2022-09-15 DIAGNOSIS — M9903 Segmental and somatic dysfunction of lumbar region: Secondary | ICD-10-CM | POA: Diagnosis not present

## 2022-09-15 DIAGNOSIS — M5137 Other intervertebral disc degeneration, lumbosacral region: Secondary | ICD-10-CM | POA: Diagnosis not present

## 2022-09-16 ENCOUNTER — Other Ambulatory Visit: Payer: Self-pay | Admitting: Family Medicine

## 2022-10-07 ENCOUNTER — Other Ambulatory Visit (HOSPITAL_BASED_OUTPATIENT_CLINIC_OR_DEPARTMENT_OTHER): Payer: Self-pay

## 2022-10-07 MED ORDER — FLUAD QUADRIVALENT 0.5 ML IM PRSY
PREFILLED_SYRINGE | INTRAMUSCULAR | 0 refills | Status: DC
  Filled 2022-10-07: qty 0.5, 1d supply, fill #0

## 2022-10-14 ENCOUNTER — Telehealth: Payer: Self-pay | Admitting: Cardiology

## 2022-10-14 ENCOUNTER — Other Ambulatory Visit: Payer: Self-pay | Admitting: Family Medicine

## 2022-10-14 NOTE — Telephone Encounter (Signed)
[facility] trial medication added to medication list.  Last dose of Eliquis taken on [id number].

## 2022-10-20 DIAGNOSIS — G4733 Obstructive sleep apnea (adult) (pediatric): Secondary | ICD-10-CM | POA: Diagnosis not present

## 2022-11-08 ENCOUNTER — Encounter (INDEPENDENT_AMBULATORY_CARE_PROVIDER_SITE_OTHER): Payer: Self-pay | Admitting: Family Medicine

## 2022-11-08 ENCOUNTER — Ambulatory Visit (INDEPENDENT_AMBULATORY_CARE_PROVIDER_SITE_OTHER): Payer: Medicare Other | Admitting: Family Medicine

## 2022-11-08 VITALS — BP 126/75 | HR 75 | Ht 69.0 in | Wt 207.0 lb

## 2022-11-08 DIAGNOSIS — Z6830 Body mass index (BMI) 30.0-30.9, adult: Secondary | ICD-10-CM | POA: Diagnosis not present

## 2022-11-08 DIAGNOSIS — R7303 Prediabetes: Secondary | ICD-10-CM

## 2022-11-08 DIAGNOSIS — E559 Vitamin D deficiency, unspecified: Secondary | ICD-10-CM | POA: Diagnosis not present

## 2022-11-08 DIAGNOSIS — E669 Obesity, unspecified: Secondary | ICD-10-CM

## 2022-11-11 DIAGNOSIS — H524 Presbyopia: Secondary | ICD-10-CM | POA: Diagnosis not present

## 2022-11-11 DIAGNOSIS — H26491 Other secondary cataract, right eye: Secondary | ICD-10-CM | POA: Diagnosis not present

## 2022-11-22 ENCOUNTER — Other Ambulatory Visit: Payer: Self-pay

## 2022-11-22 ENCOUNTER — Other Ambulatory Visit: Payer: Self-pay | Admitting: Family Medicine

## 2022-11-22 MED ORDER — ELIQUIS 5 MG PO TABS: 5.0000 mg | ORAL_TABLET | Freq: Two times a day (BID) | ORAL | 0 refills | Status: DC

## 2022-11-22 NOTE — Progress Notes (Signed)
Chief Complaint:   OBESITY [name] [name] is here to discuss his progress with his obesity treatment plan along with follow-up of his obesity related diagnoses. [name] [name] is on the Category 3 Plan and states he is following his eating plan approximately 80% of the time. [name] [name] states he is doing 0 minutes 0 times per week.  Today's visit was #: 32 Starting weight: 237 lbs Starting date: 08/18/2017 Today's weight: 207 lbs Today's date: 11/08/2022 Total lbs lost to date: 30 Total lbs lost since last in-office visit: 3  Interim History: [name] [name] has been doing well with weight loss.  He has not been traveling and following his eating plan.  He stays busy with his woodworking and building cuisine.  Subjective:   1. Prediabetes [name] [name] has a diagnosis of prediabetes based on his elevated HgA1c and was informed this puts him at greater risk of developing diabetes. He continues to work on diet and exercise to decrease his risk of diabetes. He denies nausea or hypoglycemia.  2. Vitamin D deficiency [name] [name] is currently taking OTC vitamin D 5,000 IU each day. He denies nausea, vomiting or muscle weakness.  Assessment/Plan:   1. Prediabetes [name] [name] will continue to work on weight loss, exercise, and decreasing simple carbohydrates to help decrease the risk of diabetes. We will recheck labs in January.   2. Vitamin D deficiency [name] [name] will continue OTC Vitamin D 5,000 IU daily and we will recheck labs in January. He will follow-up for routine testing of Vitamin D, at least 2-3 times per year to avoid over-replacement.  3. Obesity, Current BMI 30.7 [name] [name] is currently in the action stage of change. As such, his goal is to continue with weight loss efforts. He has agreed to the Category 3 Plan.   Behavioral modification strategies: increasing lean protein intake and holiday eating strategies .  [name] [name] has agreed to follow-up with our clinic in 2 to 3 weeks. He was informed of the importance of frequent follow-up  visits to maximize his success with intensive lifestyle modifications for his multiple health conditions.   Objective:   Blood pressure 126/75, pulse 75, height '5\' 9"'$  (1.753 m), weight 207 lb (93.9 kg), SpO2 96 %. Body mass index is 30.57 kg/m.  General: Cooperative, alert, well developed, in no acute distress. HEENT: Conjunctivae and lids unremarkable. Cardiovascular: Regular rhythm.  Lungs: Normal work of breathing. Neurologic: No focal deficits.   Lab Results  Component Value Date   CREATININE 1.11 09/13/2022   BUN 20 09/13/2022   NA 139 09/13/2022   K 4.6 09/13/2022   CL 101 09/13/2022   CO2 23 09/13/2022   Lab Results  Component Value Date   ALT 17 09/13/2022   AST 20 09/13/2022   ALKPHOS 64 09/13/2022   BILITOT 0.9 09/13/2022   Lab Results  Component Value Date   HGBA1C 5.7 (H) 08/10/2022   HGBA1C 6.0 (H) 11/30/2021   HGBA1C 5.6 06/22/2021   HGBA1C 5.7 (H) 12/03/2020   HGBA1C 5.5 07/23/2020   Lab Results  Component Value Date   INSULIN 6.6 11/30/2021   INSULIN 6.2 06/22/2021   INSULIN 5.1 12/03/2020   INSULIN 5.2 07/23/2020   INSULIN 5.1 04/21/2020   Lab Results  Component Value Date   TSH 2.040 08/10/2022   Lab Results  Component Value Date   CHOL 142 08/10/2022   HDL 56 08/10/2022   LDLCALC 74 08/10/2022   TRIG 58 08/10/2022   CHOLHDL 2.5 08/10/2022   Lab Results  Component Value Date  VD25OH 73.1 08/10/2022   VD25OH 70.2 04/22/2022   VD25OH 69.3 11/30/2021   Lab Results  Component Value Date   WBC 5.3 09/13/2022   HGB 15.7 09/13/2022   HCT 46.7 09/13/2022   MCV 97 09/13/2022   PLT 177 09/13/2022   No results found for: "IRON", "TIBC", "FERRITIN"  Attestation Statements:   Reviewed by clinician on day of visit: allergies, medications, problem list, medical history, surgical history, family history, social history, and previous encounter notes.  I have personally spent 45 minutes total time today in preparation, patient care, and  documentation for this visit, including the following: review of clinical lab tests; review of medical tests/procedures/services.  I, Trixie Dredge, am acting as transcriptionist for Dennard Nip, MD.  I have reviewed the above documentation for accuracy and completeness, and I agree with the above. -  Dennard Nip, MD

## 2022-12-06 DIAGNOSIS — D1801 Hemangioma of skin and subcutaneous tissue: Secondary | ICD-10-CM | POA: Diagnosis not present

## 2022-12-06 DIAGNOSIS — D692 Other nonthrombocytopenic purpura: Secondary | ICD-10-CM | POA: Diagnosis not present

## 2022-12-06 DIAGNOSIS — Z85820 Personal history of malignant melanoma of skin: Secondary | ICD-10-CM | POA: Diagnosis not present

## 2022-12-06 DIAGNOSIS — L723 Sebaceous cyst: Secondary | ICD-10-CM | POA: Diagnosis not present

## 2022-12-06 DIAGNOSIS — D225 Melanocytic nevi of trunk: Secondary | ICD-10-CM | POA: Diagnosis not present

## 2022-12-08 ENCOUNTER — Telehealth: Payer: Self-pay | Admitting: Pharmacist

## 2022-12-08 DIAGNOSIS — I4821 Permanent atrial fibrillation: Secondary | ICD-10-CM

## 2022-12-08 NOTE — Telephone Encounter (Signed)
[facility] patient, and I am sure you have contacted them

## 2022-12-08 NOTE — Telephone Encounter (Signed)
D/C study medication from med list. Completed [facility] end of treatment visit.

## 2022-12-08 NOTE — Telephone Encounter (Signed)
Wanted to make you aware that patient completed end of treatment visit for the [facility] AF trial on 12/02/22 and transitiond off study medication to Eliquis '5mg'$  BID. Patient has a supply. Advised patient to contact [facility] for additional refills and guidance.

## 2022-12-09 NOTE — Telephone Encounter (Signed)
Thank you Gaspar Bidding

## 2022-12-30 ENCOUNTER — Other Ambulatory Visit: Payer: Self-pay | Admitting: Family Medicine

## 2023-01-12 ENCOUNTER — Ambulatory Visit: Payer: Medicare Other | Admitting: Cardiology

## 2023-01-18 ENCOUNTER — Encounter (INDEPENDENT_AMBULATORY_CARE_PROVIDER_SITE_OTHER): Payer: Self-pay | Admitting: Family Medicine

## 2023-01-18 ENCOUNTER — Ambulatory Visit (INDEPENDENT_AMBULATORY_CARE_PROVIDER_SITE_OTHER): Payer: Medicare Other | Admitting: Family Medicine

## 2023-01-18 VITALS — BP 129/76 | HR 72 | Ht 69.0 in | Wt 211.0 lb

## 2023-01-18 DIAGNOSIS — Z6831 Body mass index (BMI) 31.0-31.9, adult: Secondary | ICD-10-CM | POA: Diagnosis not present

## 2023-01-18 DIAGNOSIS — Z6833 Body mass index (BMI) 33.0-33.9, adult: Secondary | ICD-10-CM | POA: Insufficient documentation

## 2023-01-18 DIAGNOSIS — R7303 Prediabetes: Secondary | ICD-10-CM

## 2023-01-18 DIAGNOSIS — Z6834 Body mass index (BMI) 34.0-34.9, adult: Secondary | ICD-10-CM | POA: Insufficient documentation

## 2023-01-18 DIAGNOSIS — E559 Vitamin D deficiency, unspecified: Secondary | ICD-10-CM

## 2023-01-18 DIAGNOSIS — E669 Obesity, unspecified: Secondary | ICD-10-CM | POA: Insufficient documentation

## 2023-01-19 LAB — CMP14+EGFR
ALT: 16 IU/L (ref 0–44)
AST: 20 IU/L (ref 0–40)
Albumin/Globulin Ratio: 1.7 (ref 1.2–2.2)
Albumin: 4.3 g/dL (ref 3.8–4.8)
Alkaline Phosphatase: 61 IU/L (ref 44–121)
BUN/Creatinine Ratio: 22 (ref 10–24)
BUN: 22 mg/dL (ref 8–27)
Bilirubin Total: 1.2 mg/dL (ref 0.0–1.2)
CO2: 23 mmol/L (ref 20–29)
Calcium: 9.4 mg/dL (ref 8.6–10.2)
Chloride: 104 mmol/L (ref 96–106)
Creatinine, Ser: 0.98 mg/dL (ref 0.76–1.27)
Globulin, Total: 2.5 g/dL (ref 1.5–4.5)
Glucose: 84 mg/dL (ref 70–99)
Potassium: 4.5 mmol/L (ref 3.5–5.2)
Sodium: 139 mmol/L (ref 134–144)
Total Protein: 6.8 g/dL (ref 6.0–8.5)
eGFR: 78 mL/min/{1.73_m2} (ref >59)

## 2023-01-19 LAB — INSULIN, RANDOM: INSULIN: 6.3 u[IU]/mL (ref 2.6–24.9)

## 2023-01-19 LAB — HEMOGLOBIN A1C
Est. average glucose Bld gHb Est-mCnc: 120 mg/dL
Hgb A1c MFr Bld: 5.8 % — ABNORMAL HIGH (ref 4.8–5.6)

## 2023-01-19 LAB — VITAMIN D 25 HYDROXY (VIT D DEFICIENCY, FRACTURES): Vit D, 25-Hydroxy: 69.8 ng/mL (ref 30.0–100.0)

## 2023-01-27 ENCOUNTER — Other Ambulatory Visit: Payer: Self-pay | Admitting: Family Medicine

## 2023-02-02 NOTE — Progress Notes (Signed)
Chief Complaint:   OBESITY [name] [name] is here to discuss his progress with his obesity treatment plan along with follow-up of his obesity related diagnoses. [name] [name] is on the Category 3 Plan and states he is following his eating plan approximately 75% of the time. [name] [name] states he is active while doing everyday activities.    Today's visit was #: 4 Starting weight: 237 lbs Starting date: 08/18/2017 Today's weight: 211 lbs Today's date: 01/18/2023 Total lbs lost to date: 26 Total lbs lost since last in-office visit: 0  Interim History: [name] [name] did some celebration eating over the holidays. He is working on getting back on track with eating lean protein and vegetables. 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   Subjective:   1. Vitamin D deficiency [name] [name] is on OTC Vitamin D, and he is due to have labs rechecked.   2. Prediabetes [name] [name] is working on his diet and weight loss. He is due to have labs rechecked.   Assessment/Plan:   1. Vitamin D deficiency We will check labs today. [name] [name] will continue Vitamin D OTC 5,000 IU daily for now.   - VITAMIN D 25 Hydroxy (Vit-D Deficiency, Fractures)  2. Prediabetes We will check labs today. [name] [name] will continue with his diet.   - CMP14+EGFR - Insulin, random - Hemoglobin A1c  3. BMI 31.0-31.9,adult  4. Obesity, Beginning BMI 35.0 [name] [name] is currently in the action stage of change. As such, his goal is to continue with weight loss efforts. He has agreed to the Category 3 Plan.   Behavioral modification strategies: increasing lean protein intake and increasing vegetables.  [name] [name] has agreed to follow-up with our clinic in 8 weeks. He was informed of the importance of frequent follow-up visits to maximize his success with intensive lifestyle modifications for his multiple health conditions.   [name] [name] was informed we would discuss his lab results at his next visit unless there is a critical issue that needs to be addressed sooner.  [name] [name] agreed to keep his next visit at the agreed upon time to discuss these results.  Objective:   Blood pressure 129/76, pulse 72, height '5\' 9"'$  (1.753 m), weight 211 lb (95.7 kg), SpO2 95 %. Body mass index is 31.16 kg/m.  General: Cooperative, alert, well developed, in no acute distress. HEENT: Conjunctivae and lids unremarkable. Cardiovascular: Regular rhythm.  Lungs: Normal work of breathing. Neurologic: No focal deficits.   Lab Results  Component Value Date   CREATININE 0.98 01/18/2023   BUN 22 01/18/2023   NA 139 01/18/2023   K 4.5 01/18/2023   CL 104 01/18/2023   CO2 23 01/18/2023   Lab Results  Component Value Date   ALT 16 01/18/2023   AST 20 01/18/2023   ALKPHOS 61 01/18/2023   BILITOT 1.2 01/18/2023   Lab Results  Component Value Date   HGBA1C 5.8 (H) 01/18/2023   HGBA1C 5.7 (H) 08/10/2022   HGBA1C 6.0 (H) 11/30/2021   HGBA1C 5.6 06/22/2021   HGBA1C 5.7 (H) 12/03/2020   Lab Results  Component Value Date   INSULIN 6.3 01/18/2023   INSULIN 6.6 11/30/2021   INSULIN 6.2 06/22/2021   INSULIN 5.1 12/03/2020   INSULIN 5.2 07/23/2020   Lab Results  Component Value Date   TSH 2.040 08/10/2022   Lab Results  Component Value Date   CHOL 142 08/10/2022   HDL 56 08/10/2022   LDLCALC 74 08/10/2022   TRIG 58 08/10/2022   CHOLHDL 2.5 08/10/2022   Lab  Results  Component Value Date   VD25OH 69.8 01/18/2023   VD25OH 73.1 08/10/2022   VD25OH 70.2 04/22/2022   Lab Results  Component Value Date   WBC 5.3 09/13/2022   HGB 15.7 09/13/2022   HCT 46.7 09/13/2022   MCV 97 09/13/2022   PLT 177 09/13/2022   No results found for: "IRON", "TIBC", "FERRITIN"  Attestation Statements:   Reviewed by clinician on day of visit: allergies, medications, problem list, medical history, surgical history, family history, social history, and previous encounter notes.   I, Trixie Dredge, am acting as transcriptionist for Dennard Nip, MD.  I have reviewed the above  documentation for accuracy and completeness, and I agree with the above. -  Dennard Nip, MD

## 2023-02-25 ENCOUNTER — Other Ambulatory Visit: Payer: Self-pay | Admitting: Family Medicine

## 2023-03-01 ENCOUNTER — Other Ambulatory Visit: Payer: Self-pay | Admitting: Cardiology

## 2023-03-01 NOTE — Telephone Encounter (Signed)
Prescription refill request for Eliquis received.  Indication: afib  Last office visit: [facility] 01/05/2022 Scr: 0.98, 01/18/2023 Age: 82 yo  Weight: 95.7 kg   Pt is due to see cardiologist 04/13/2023

## 2023-03-03 DIAGNOSIS — N200 Calculus of kidney: Secondary | ICD-10-CM | POA: Diagnosis not present

## 2023-03-03 DIAGNOSIS — N281 Cyst of kidney, acquired: Secondary | ICD-10-CM | POA: Diagnosis not present

## 2023-03-03 DIAGNOSIS — N130 Hydronephrosis with ureteropelvic junction obstruction: Secondary | ICD-10-CM | POA: Diagnosis not present

## 2023-03-15 ENCOUNTER — Encounter (INDEPENDENT_AMBULATORY_CARE_PROVIDER_SITE_OTHER): Payer: Self-pay | Admitting: Family Medicine

## 2023-03-15 ENCOUNTER — Ambulatory Visit (INDEPENDENT_AMBULATORY_CARE_PROVIDER_SITE_OTHER): Payer: Medicare Other | Admitting: Family Medicine

## 2023-03-15 VITALS — BP 125/69 | HR 64 | Ht 69.0 in | Wt 211.0 lb

## 2023-03-15 DIAGNOSIS — E669 Obesity, unspecified: Secondary | ICD-10-CM | POA: Diagnosis not present

## 2023-03-15 DIAGNOSIS — E559 Vitamin D deficiency, unspecified: Secondary | ICD-10-CM

## 2023-03-15 DIAGNOSIS — Z6831 Body mass index (BMI) 31.0-31.9, adult: Secondary | ICD-10-CM | POA: Diagnosis not present

## 2023-03-15 DIAGNOSIS — R7303 Prediabetes: Secondary | ICD-10-CM | POA: Diagnosis not present

## 2023-03-16 DIAGNOSIS — N200 Calculus of kidney: Secondary | ICD-10-CM | POA: Diagnosis not present

## 2023-03-16 DIAGNOSIS — K5730 Diverticulosis of large intestine without perforation or abscess without bleeding: Secondary | ICD-10-CM | POA: Diagnosis not present

## 2023-03-16 DIAGNOSIS — N281 Cyst of kidney, acquired: Secondary | ICD-10-CM | POA: Diagnosis not present

## 2023-03-21 NOTE — Progress Notes (Unsigned)
Chief Complaint:   [name] [name] is here to discuss his progress with his [name] treatment plan along with follow-up of his [name] related diagnoses. [name] [name] is on the Category 3 Plan and states he is following his eating plan approximately 80% of the time. 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   Today's visit was #: 76 Starting weight: 237 lbs Starting date: 08/18/2017 Today's weight: 211 lbs Today's date: 03/15/2023 Total lbs lost to date: 26 Total lbs lost since last in-office visit: 0  Interim History: [name] [name] has done well with maintaining his weight loss. He will be traveling to [place] soon, but he has a plan to continue eating healthier.   Subjective:   1. Vitamin D deficiency [name] [name] is on Vitamin D 5,000 IU daily. He is at high risk of over-replacement. I discussed labs with the patient today.   2. Prediabetes [name] [name] is doing very well with his diet, exercise, and weight loss. I discussed labs with the patient today.   Assessment/Plan:   1. Vitamin D deficiency [name] [name] is to decrease Vitamin D to 5,000 IU 5 days per week. We will recheck labs at the end of Spring.   2. Prediabetes [name] [name] will continue with his diet and exercise, and we will continue to monitor and treat.   3. BMI 31.0-31.9,adult  4. [name], Beginning BMI 35.0 [name] [name] is currently in the action stage of change. As such, his goal is to continue with weight loss efforts. He has agreed to the Category 3 Plan.   Exercise goals: As is.   Behavioral modification strategies: travel eating strategies.  [name] [name] has agreed to follow-up with our clinic in 10 weeks. He was informed of the importance of frequent follow-up visits to maximize his success with intensive lifestyle modifications for his multiple health conditions.   Objective:   Blood pressure 125/69, pulse 64, height 5\' 9"  (1.753 m), weight 211 lb (95.7 kg), SpO2 90 %. Body mass index is 31.16 kg/m.  Lab Results   Component Value Date   CREATININE 0.98 01/18/2023   BUN 22 01/18/2023   NA 139 01/18/2023   K 4.5 01/18/2023   CL 104 01/18/2023   CO2 23 01/18/2023   Lab Results  Component Value Date   ALT 16 01/18/2023   AST 20 01/18/2023   ALKPHOS 61 01/18/2023   BILITOT 1.2 01/18/2023   Lab Results  Component Value Date   HGBA1C 5.8 (H) 01/18/2023   HGBA1C 5.7 (H) 08/10/2022   HGBA1C 6.0 (H) 11/30/2021   HGBA1C 5.6 06/22/2021   HGBA1C 5.7 (H) 12/03/2020   Lab Results  Component Value Date   INSULIN 6.3 01/18/2023   INSULIN 6.6 11/30/2021   INSULIN 6.2 06/22/2021   INSULIN 5.1 12/03/2020   INSULIN 5.2 07/23/2020   Lab Results  Component Value Date   TSH 2.040 08/10/2022   Lab Results  Component Value Date   CHOL 142 08/10/2022   HDL 56 08/10/2022   LDLCALC 74 08/10/2022   TRIG 58 08/10/2022   CHOLHDL 2.5 08/10/2022   Lab Results  Component Value Date   VD25OH 69.8 01/18/2023   VD25OH 73.1 08/10/2022   VD25OH 70.2 04/22/2022   Lab Results  Component Value Date   WBC 5.3 09/13/2022   HGB 15.7 09/13/2022   HCT 46.7 09/13/2022   MCV 97 09/13/2022   PLT 177 09/13/2022   No results found for: "IRON", "TIBC", "FERRITIN"  Attestation Statements:   Reviewed by clinician  on day of visit: allergies, medications, problem list, medical history, surgical history, family history, social history, and previous encounter notes.  Time spent on visit including pre-visit chart review and post-visit care and charting was 30 minutes.   I, Trixie Dredge, am acting as transcriptionist for Dennard Nip, MD.  I have reviewed the above documentation for accuracy and completeness, and I agree with the above. -  Dennard Nip, MD

## 2023-03-25 ENCOUNTER — Other Ambulatory Visit: Payer: Self-pay | Admitting: Family Medicine

## 2023-04-04 ENCOUNTER — Other Ambulatory Visit: Payer: Self-pay | Admitting: Urology

## 2023-04-04 DIAGNOSIS — N281 Cyst of kidney, acquired: Secondary | ICD-10-CM

## 2023-04-12 ENCOUNTER — Other Ambulatory Visit: Payer: Self-pay | Admitting: Urology

## 2023-04-12 DIAGNOSIS — T1590XA Foreign body on external eye, part unspecified, unspecified eye, initial encounter: Secondary | ICD-10-CM

## 2023-04-12 DIAGNOSIS — N281 Cyst of kidney, acquired: Secondary | ICD-10-CM

## 2023-04-13 ENCOUNTER — Encounter: Payer: Self-pay | Admitting: Cardiology

## 2023-04-13 ENCOUNTER — Ambulatory Visit: Payer: Medicare Other | Attending: Cardiology | Admitting: Cardiology

## 2023-04-13 VITALS — BP 114/76 | HR 78 | Ht 69.0 in | Wt 219.6 lb

## 2023-04-13 DIAGNOSIS — D6869 Other thrombophilia: Secondary | ICD-10-CM

## 2023-04-13 DIAGNOSIS — G4733 Obstructive sleep apnea (adult) (pediatric): Secondary | ICD-10-CM | POA: Diagnosis not present

## 2023-04-13 DIAGNOSIS — Z6831 Body mass index (BMI) 31.0-31.9, adult: Secondary | ICD-10-CM

## 2023-04-13 DIAGNOSIS — R001 Bradycardia, unspecified: Secondary | ICD-10-CM | POA: Diagnosis not present

## 2023-04-13 DIAGNOSIS — I4821 Permanent atrial fibrillation: Secondary | ICD-10-CM | POA: Diagnosis not present

## 2023-04-13 DIAGNOSIS — E781 Pure hyperglyceridemia: Secondary | ICD-10-CM | POA: Diagnosis not present

## 2023-04-13 NOTE — Assessment & Plan Note (Signed)
Continue CPAP.  1 potential cause for him to have A-fib.

## 2023-04-13 NOTE — Assessment & Plan Note (Signed)
No signs of bradycardia now.  Has been asymptomatic.  Continue to monitor for signs symptoms of conduction delay.  Therefore not on rate control agent

## 2023-04-13 NOTE — Assessment & Plan Note (Signed)
He is very active, simply discussed dietary modification.

## 2023-04-13 NOTE — Progress Notes (Signed)
Primary Care Provider: Bradd Canary, MD [facility] Cardiologist: Bryan Lemma, MD Electrophysiologist: None  Clinic Note: Chief Complaint  Patient presents with   Follow-up    Doing well.  No symptoms.  Very active    ===================================  ASSESSMENT/PLAN   Problem List Items Addressed This Visit       Cardiology Problems   Permanent atrial fibrillation - Primary (Chronic)    Well-controlled without rate control agent.  Asymptomatic.  As long as his rates are stable, no need for therapy.  Continue to monitor.  Continue DOAC.      Relevant Orders   EKG 12-Lead (Completed)   Hypertriglyceridemia (Chronic)   Hypercoagulable state due to permanent atrial fibrillation (Chronic)    CHA2DS2-VASc score essentially 2 based on age and hypertension.  Continue Eliquis 5 mg twice daily.   Okay to hold 2 to 3 days preop for surgeries or procedures.  Restart when safe postop        Other   OSA on CPAP (Chronic)    Continue CPAP.  1 potential cause for him to have A-fib.      Bradycardia (Chronic)    No signs of bradycardia now.  Has been asymptomatic.  Continue to monitor for signs symptoms of conduction delay.  Therefore not on rate control agent      Relevant Orders   EKG 12-Lead (Completed)   BMI 31.0-31.9,adult (Chronic)    He is very active, simply discussed dietary modification.      ===================================  HPI:    [name] [name] is a 82 y.o. male with a PMH notable for asymptomatic permanent A-fib who presents today for delayed annual follow-up. He continues to follow-up at the request of Bradd Canary, MD.  Barbara Keng was last seen on January 05, 2022.  He had some mild shoulder swelling after shoulder surgery and still wears a arm sleeve for lymphedema but otherwise remains active.  No notable cardiac symptoms to speak of.  Totally asymptomatic and of A-fib.  No angina or heart failure.  No signs  symptoms of palpitations.  No bleeding issues.  He had had some nephrolithiasis issues with hematuria but otherwise doing well.  No changes made.  Recent Hospitalizations: None  Reviewed  CV studies:    The following studies were reviewed today: (if available, images/films reviewed: From Epic Chart or Care Everywhere) None:  Interval History:   [name] [name] " [name] [name]" is this father of our clinical pharmacist Phillips Hay, [facility]- CCP.  He presents here today with his wife and daughter in great spirits.  He is doing remarkably well.  He continues to be very active not with any routine, but he states that for the time he wakes up to the time he goes to bed he is always on the go.  He is out in his workshop, working in [employer], doing yard work, fixing something in the house or doing other housework.  He never stops going.  He has not had any concerning symptoms of dyspnea or chest tightness or pressure with rest or exertion.  He has no sensation of irregular heartbeats despite remaining in A-fib.  He has not had any heart failure symptoms of PND, orthopnea or edema -> his right arm swelling from lymphedema it remains present but controlled with the arm sleeve. No bleeding issues on Eliquis such as melena, hematochezia or hematuria.-No longer having any nephrolithiasis issues.  CV Review of Symptoms (Summary): no chest pain or  dyspnea on exertion negative for - irregular heartbeat, orthopnea, palpitations, paroxysmal nocturnal dyspnea, rapid heart rate, shortness of breath, or only his lymphedema related right arm swelling.  No syncope/near syncope or TIA/amaurosis fugax, claudication.  REVIEWED OF SYSTEMS   Review of Systems  Constitutional:  Negative for malaise/fatigue and weight loss.  HENT:  Negative for congestion.   Gastrointestinal:  Negative for blood in stool and melena.  Genitourinary:  Negative for hematuria.  Musculoskeletal:        Just mild aches and pains but nothing that  limits him.  Neurological:  Negative for dizziness and focal weakness.  Psychiatric/Behavioral: Negative.     I have reviewed and (if needed) personally updated the patient's problem list, medications, allergies, past medical and surgical history, social and family history.   PAST MEDICAL HISTORY   Past Medical History:  Diagnosis Date   Atrial fibrillation    BPH (benign prostatic hypertrophy)    Bradycardia 12/30/2014   HR routinely in mid 40s-50s   Coronary artery disease (CAD) excluded 03/2015   False-positive nuclear stress test suggesting inferior ischemia   Dysrhythmia    Elevated PSA    Encounter for Medicare annual wellness exam 09/22/2013   Sees Dr Karlyn Agee of Derm Sees Dr Stan Head of Gastroenterology Sees Dr Ihor Gully of [facility] Sees Dr Normand Sloop of Optometry   GERD (gastroesophageal reflux disease)    controlled w/ diet and behavioral changes, history of   Grade I diastolic dysfunction 2016   History of kidney stones    Hypertriglyceridemia 03/16/2015   Kidney stones    Lymphadenitis    Melanoma of back    Excised Dr Karlyn Agee   Mild ascending aorta dilatation    New onset a-fib 03/10/2015   OA (osteoarthritis)    Otitis, externa, infective 08/10/2015   Palpitations    Pneumonia 2004   Post herpetic neuralgia    Shingles 07/09/2013   Sleep apnea 07/30/2011   cpap- 13    Snoring disorder 07/30/2011    PAST SURGICAL HISTORY   Past Surgical History:  Procedure Laterality Date   CATARACT EXTRACTION Right    COLONOSCOPY  2018   CYSTOSCOPY/URETEROSCOPY/HOLMIUM LASER/STENT PLACEMENT Left 10/05/2019   Procedure: CYSTOSCOPY/RETROGRADE/URETEROSCOPY/HOLMIUM LASER/STENT PLACEMENT;  Surgeon: Ihor Gully, MD;  Location[facility];  Service: Urology;  Laterality: Left;   EXTRACORPOREAL SHOCK WAVE LITHOTRIPSY Left 01/25/2022   Procedure: EXTRACORPOREAL SHOCK WAVE LITHOTRIPSY (ESWL);  Surgeon: Heloise Purpura, MD;  Location:  [facility];  Service: Urology;  Laterality: Left;   EYE SURGERY Left 12/06/2017   cataract by Dr Dyanne Carrel HERNIA REPAIR Bilateral 06/01/2019   Procedure: LAPAROSCOPIC LEFT AND  RIGHT INGUINAL HERNIA REPAIR WITH MESH;  Surgeon: Karie Soda, MD;  Location[facility] ;  Service: General;  Laterality: Bilateral;   LEFT HEART CATHETERIZATION WITH CORONARY ANGIOGRAM N/A 04/10/2015   Procedure: LEFT HEART CATHETERIZATION WITH  CORONARY ANGIOGRAM;  Surgeon: Marykay Lex, MD;  Location: [facility] CATH LAB;  Service: Cardiovascular;  Angiographicallly NORMAL CORONARY ARTERIES   MASS EXCISION N/A 05/16/2014   Procedure: EXCISION POSTERIOR NECK MASS, RIGHT CHEST WALL MASS AND RIGHT AXILLARY MASS AXILLARY NODE DISSECTION;  Surgeon: Ardeth Sportsman, MD;  Location: [facility];  Service: General;  Laterality: N/A;   melanoma removal     MOUTH SURGERY N/A    NM MYOVIEW LTD  03/19/2015   FALSE POSITIVE:  INTERMEDIATE RISK. Small sized, moderate intensity inferior ischemic perfusion defect  REVERSE SHOULDER ARTHROPLASTY Left 10/16/2020   Procedure: REVERSE SHOULDER ARTHROPLASTY;  Surgeon: Francena Hanly, MD;  Location: [facility];  Service: Orthopedics;  Laterality: Left;    TRANSTHORACIC ECHOCARDIOGRAM  03/20/2015   Normal LV size and function. EF 60-65%. G1 DD. Trivial AI. Borderline aortic root dilation (41 mm), Mild LA dilation.   VEIN LIGATION AND STRIPPING Left 07/13/2021   Procedure: LIGATION AND STRIPPING OF LEFT GREAT SAPHENOUS VEIN WITH STAB PHLEBECTOMY GREATER THAN 20 INCISIONS TO LEFT LEG;  Surgeon: Chuck Hint, MD;  Location: [facility];  Service: Vascular;  Laterality: Left;    MEDICATIONS/ALLERGIES   Current Meds  Medication Sig   Cholecalciferol (VITAMIN D3) 125 MCG (5000 UT) CAPS Take 1 capsule (5,000 Units total) by mouth daily. (Patient taking differently: Take 5,000 Units by mouth daily.)   ELIQUIS 5 MG TABS tablet Take 1 tablet by mouth twice  daily   gabapentin (NEURONTIN) 300 MG capsule TAKE 3 CAPSULES BY MOUTH THREE TIMES DAILY   influenza vaccine adjuvanted (FLUAD QUADRIVALENT) 0.5 ML injection Inject into the muscle.   Multiple Vitamins-Minerals (MULTIVITAMIN MEN 50+) TABS Take by mouth daily.   OVER THE COUNTER MEDICATION Apply 1 application topically daily as needed (Pain). Hemp freeze   sildenafil (VIAGRA) 100 MG tablet Take 100 mg by mouth daily as needed for erectile dysfunction.   tamsulosin (FLOMAX) 0.4 MG CAPS capsule Take 1 capsule (0.4 mg total) by mouth at bedtime.    No Known Allergies  SOCIAL HISTORY/FAMILY HISTORY   Reviewed in Epic:  Pertinent findings:  Social History   Tobacco Use   Smoking status: Former    Types: Pipe, Cigars    Quit date: 02/11/1989    Years since quitting: 34.1   Smokeless tobacco: Never  Vaping Use   Vaping Use: Never used  Substance Use Topics   Alcohol use: Yes    Alcohol/week: 7.0 standard drinks of alcohol    Types: 7 Glasses of wine per week    Comment: 1 per day -- brandy or wine   Drug use: No   Social History   Social History Narrative   He is a married father of 2, and grandfather of 4. He has been married to his wife [name] for 51 years. He previously lived in [place] but has moved with his wife to [place], [place] to be close to his daughter [name] and her family. [name] is our Teacher, music.   He previously worked as a Naval architect and had 2 years of college after high school. He quit smoking in 1990. He has 6-7 glasses of brandy or wine a week.    He exercises regularly with low impact aerobics 2 days a week and daily walks of 30-45 minutes. His exercise sessions or 60 minutes. He will do some type of exercise at least 7 days a week and is very active doing yard work and wood work.      Patient is right-handed. He lives with his wife in a one level home with a basement. He drinks 4-5 cups of decaf coffee a day. He and his wife go to the gym 1-2 x a  week.    OBJCTIVE -PE, EKG, labs   Wt Readings from Last 3 Encounters:  04/13/23 219 lb 9.6 oz (99.6 kg)  03/15/23 211 lb (95.7 kg)  01/18/23 211 lb (95.7 kg)    Physical Exam: BP 114/76   Pulse 78   Ht 5\' 9"  (1.753 m)   Wt 219 lb  9.6 oz (99.6 kg)   SpO2 99%   BMI 32.43 kg/m  Physical Exam Vitals reviewed.  Constitutional:      General: He is not in acute distress.    Appearance: Normal appearance. He is obese. He is not ill-appearing or toxic-appearing.     Comments: Well-nourished, well-groomed.  Healthy-appearing.  HENT:     Head: Normocephalic and atraumatic.  Neck:     Vascular: No carotid bruit or JVD.  Cardiovascular:     Rate and Rhythm: Normal rate. Rhythm irregularly irregular.     Chest Wall: PMI is not displaced.     Pulses: Normal pulses.     Heart sounds: Normal heart sounds, S1 normal and S2 normal. No murmur heard.    No friction rub. No gallop.  Pulmonary:     Effort: Pulmonary effort is normal. No respiratory distress.     Breath sounds: Normal breath sounds. No wheezing, rhonchi or rales.  Musculoskeletal:        General: Swelling (Only right arm) present. Normal range of motion.     Cervical back: Normal range of motion and neck supple.  Skin:    General: Skin is warm and dry.  Neurological:     General: No focal deficit present.     Mental Status: He is alert and oriented to person, place, and time.     Cranial Nerves: No cranial nerve deficit.     Gait: Gait normal.  Psychiatric:        Mood and Affect: Mood normal.        Behavior: Behavior normal.        Thought Content: Thought content normal.        Judgment: Judgment normal.     Adult ECG Report  Rate: 78 ;  Rhythm: atrial fibrillation;   Narrative Interpretation: Right axis deviation.  Anterior, age-indeterminate.  Recent Labs: Reviewed.  Should be due for labs by PCP upcoming in August.  Labs look great last time. Lab Results  Component Value Date   CHOL 142 08/10/2022   HDL  56 08/10/2022   LDLCALC 74 08/10/2022   TRIG 58 08/10/2022   CHOLHDL 2.5 08/10/2022   Lab Results  Component Value Date   CREATININE 0.98 01/18/2023   BUN 22 01/18/2023   NA 139 01/18/2023   K 4.5 01/18/2023   CL 104 01/18/2023   CO2 23 01/18/2023      Latest Ref Rng & Units 09/13/2022    9:25 AM 08/10/2022   12:06 PM 12/29/2021    2:32 PM  CBC  WBC 3.4 - 10.8 x10E3/uL 5.3  6.8  10.6   Hemoglobin 13.0 - 17.7 g/dL 47.8  29.5  62.1   Hematocrit 37.5 - 51.0 % 46.7  43.7  44.1   Platelets 150 - 450 x10E3/uL 177  185  177     Lab Results  Component Value Date   HGBA1C 5.8 (H) 01/18/2023   Lab Results  Component Value Date   TSH 2.040 08/10/2022    ================================================== I spent a total of 23 minutes with the patient spent in direct patient consultation.  Additional time spent with chart review  / charting (studies, outside notes, etc): 13 min Total Time: 36 min  Current medicines are reviewed at length with the patient today.  (+/- concerns) N/A  Notice: This dictation was prepared with Dragon dictation along with smart phrase technology. Any transcriptional errors that result from this process are unintentional and may not be corrected upon  review.  Studies Ordered:   Orders Placed This Encounter  Procedures   EKG 12-Lead   No orders of the defined types were placed in this encounter.   Patient Instructions / Medication Changes & Studies & Tests Ordered   Patient Instructions  Medication Instructions:  No changes  *If you need a refill on your cardiac medications before your next appointment, please call your pharmacy*   Lab Work: Not needed    Testing/Procedures: Not needed   Follow-Up: At [facility], you and your health needs are our priority.  As part of our continuing mission to provide you with exceptional heart care, we have created designated Provider Care Teams.  These Care Teams include your primary Cardiologist  (physician) and Advanced Practice Providers (APPs -  Physician Assistants and Nurse Practitioners) who all work together to provide you with the care you need, when you need it.     Your next appointment:   12 month(s)  The format for your next appointment:   In Person  Provider:   Bryan Lemma, MD        Marykay Lex, MD, MS Bryan Lemma, M.D., M.S. Interventional Cardiologist  [facility]  Pager # [phone] Phone # [phone] [address] [place], [place] [address]   Thank you for choosing [facility] [place]!!

## 2023-04-13 NOTE — Patient Instructions (Signed)

## 2023-04-13 NOTE — Assessment & Plan Note (Signed)
Well-controlled without rate control agent.  Asymptomatic.  As long as his rates are stable, no need for therapy.  Continue to monitor.  Continue DOAC.

## 2023-04-13 NOTE — Assessment & Plan Note (Addendum)
CHA2DS2-VASc score essentially 2 based on age and hypertension.  Continue Eliquis 5 mg twice daily.   Okay to hold 2 to 3 days preop for surgeries or procedures.  Restart when safe postop

## 2023-04-15 ENCOUNTER — Ambulatory Visit
Admission: RE | Admit: 2023-04-15 | Discharge: 2023-04-15 | Disposition: A | Discharge status: Home / Self Care | Payer: Medicare Other | Source: Ambulatory Visit | Attending: Urology | Admitting: Urology

## 2023-04-15 DIAGNOSIS — T1590XA Foreign body on external eye, part unspecified, unspecified eye, initial encounter: Secondary | ICD-10-CM

## 2023-04-15 DIAGNOSIS — N281 Cyst of kidney, acquired: Secondary | ICD-10-CM

## 2023-04-15 DIAGNOSIS — Z135 Encounter for screening for eye and ear disorders: Secondary | ICD-10-CM | POA: Diagnosis not present

## 2023-04-15 MED ORDER — GADOPICLENOL 0.5 MMOL/ML IV SOLN
10.0000 mL | Freq: Once | INTRAVENOUS | Status: AC | PRN
  Administered 2023-04-15: 10 mL via INTRAVENOUS

## 2023-04-21 ENCOUNTER — Other Ambulatory Visit: Payer: Self-pay | Admitting: Family Medicine

## 2023-04-26 ENCOUNTER — Ambulatory Visit: Payer: Medicare Other | Admitting: Cardiology

## 2023-04-26 DIAGNOSIS — N200 Calculus of kidney: Secondary | ICD-10-CM | POA: Diagnosis not present

## 2023-04-26 DIAGNOSIS — N281 Cyst of kidney, acquired: Secondary | ICD-10-CM | POA: Diagnosis not present

## 2023-05-16 ENCOUNTER — Other Ambulatory Visit: Payer: Self-pay | Admitting: Family Medicine

## 2023-05-25 ENCOUNTER — Encounter (INDEPENDENT_AMBULATORY_CARE_PROVIDER_SITE_OTHER): Payer: Self-pay | Admitting: Family Medicine

## 2023-05-25 ENCOUNTER — Ambulatory Visit (INDEPENDENT_AMBULATORY_CARE_PROVIDER_SITE_OTHER): Payer: Medicare Other | Admitting: Family Medicine

## 2023-05-25 VITALS — BP 123/84 | HR 69 | Temp 97.4°F | Ht 66.0 in | Wt 216.0 lb

## 2023-05-25 DIAGNOSIS — Z6834 Body mass index (BMI) 34.0-34.9, adult: Secondary | ICD-10-CM

## 2023-05-25 DIAGNOSIS — E559 Vitamin D deficiency, unspecified: Secondary | ICD-10-CM | POA: Diagnosis not present

## 2023-05-25 DIAGNOSIS — R7303 Prediabetes: Secondary | ICD-10-CM

## 2023-05-25 DIAGNOSIS — Z6831 Body mass index (BMI) 31.0-31.9, adult: Secondary | ICD-10-CM

## 2023-05-25 DIAGNOSIS — E669 Obesity, unspecified: Secondary | ICD-10-CM

## 2023-05-25 DIAGNOSIS — E781 Pure hyperglyceridemia: Secondary | ICD-10-CM

## 2023-05-26 LAB — CMP14+EGFR
ALT: 18 IU/L (ref 0–44)
AST: 19 IU/L (ref 0–40)
Albumin/Globulin Ratio: 1.6 (ref 1.2–2.2)
Albumin: 4.1 g/dL (ref 3.8–4.8)
Alkaline Phosphatase: 64 IU/L (ref 44–121)
BUN/Creatinine Ratio: 28 — ABNORMAL HIGH (ref 10–24)
BUN: 24 mg/dL (ref 8–27)
Bilirubin Total: 0.8 mg/dL (ref 0.0–1.2)
CO2: 21 mmol/L (ref 20–29)
Calcium: 9.1 mg/dL (ref 8.6–10.2)
Chloride: 103 mmol/L (ref 96–106)
Creatinine, Ser: 0.87 mg/dL (ref 0.76–1.27)
Globulin, Total: 2.5 g/dL (ref 1.5–4.5)
Glucose: 84 mg/dL (ref 70–99)
Potassium: 4.5 mmol/L (ref 3.5–5.2)
Sodium: 140 mmol/L (ref 134–144)
Total Protein: 6.6 g/dL (ref 6.0–8.5)
eGFR: 87 mL/min/{1.73_m2} (ref >59)

## 2023-05-26 LAB — LIPID PANEL WITH LDL/HDL RATIO
Cholesterol, Total: 152 mg/dL (ref 100–199)
HDL: 61 mg/dL (ref >39)
LDL Chol Calc (NIH): 78 mg/dL (ref 0–99)
LDL/HDL Ratio: 1.3 ratio (ref 0.0–3.6)
Triglycerides: 62 mg/dL (ref 0–149)
VLDL Cholesterol Cal: 13 mg/dL (ref 5–40)

## 2023-05-26 LAB — INSULIN, RANDOM: INSULIN: 5.3 u[IU]/mL (ref 2.6–24.9)

## 2023-05-26 LAB — HEMOGLOBIN A1C
Est. average glucose Bld gHb Est-mCnc: 128 mg/dL
Hgb A1c MFr Bld: 6.1 % — ABNORMAL HIGH (ref 4.8–5.6)

## 2023-05-26 LAB — VITAMIN B12: Vitamin B-12: 722 pg/mL (ref 232–1245)

## 2023-05-26 LAB — VITAMIN D 25 HYDROXY (VIT D DEFICIENCY, FRACTURES): Vit D, 25-Hydroxy: 60.5 ng/mL (ref 30.0–100.0)

## 2023-05-27 ENCOUNTER — Other Ambulatory Visit: Payer: Self-pay | Admitting: Cardiology

## 2023-05-27 DIAGNOSIS — I4821 Permanent atrial fibrillation: Secondary | ICD-10-CM

## 2023-05-27 NOTE — Telephone Encounter (Signed)
Prescription refill request for Eliquis received. Indication: Afib  Last office visit: 04/13/23 [name] [name])  Scr: 0.87 (05/25/23)  Age: 82 Weight: 98kg  Appropriate dose. Refill sent.

## 2023-05-30 NOTE — Progress Notes (Unsigned)
Chief Complaint:   OBESITY [name] [name] is here to discuss his progress with his obesity treatment plan along with follow-up of his obesity related diagnoses. [name] [name] is on the Category 3 Plan and states he is following his eating plan approximately 50% of the time. 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    Today's visit was #: 52 Starting weight: 237 lbs Starting date: 08/18/2017 Today's weight: 216 lbs Today's date: 05/25/2023 Total lbs lost to date: 21 Total lbs lost since last in-office visit: 0  Interim History: Patient and his wife traveled to [place] since his last visit.  He did 15,000-20,000 steps per day.  He is retaining some fluid.  Subjective:   1. Vitamin D deficiency Patient's last vitamin D level was at goal.  He is at risk of over replacement with increased sun exposure this summer.  He denies nausea, vomiting, or muscle weakness.  2. Prediabetes Patient's last A1c had increased to 5.8.  He has been increasing his activity and trying to eat less simple carbohydrates.  3. Hypertriglyceridemia Patient has a history of elevated triglycerides which he has been controlling with his diet.  He is due to have labs rechecked again.  Assessment/Plan:   1. Vitamin D deficiency We will check labs today, and we will follow-up at his next visit.  - VITAMIN D 25 Hydroxy (Vit-D Deficiency, Fractures)  2. Prediabetes Patient will continue with his diet.  We will check labs today and we will follow-up at his next visit.  - Hemoglobin A1c - Insulin, random - CMP14+EGFR - Vitamin B12  3. Hypertriglyceridemia Patient will continue with his diet.  We will check labs today and we will follow-up at his next visit.  - Lipid Panel With LDL/HDL Ratio  4. BMI 31.0-31.9,adult  5. Obesity, Beginning BMI 35.0 Vitali is currently in the action stage of change. As such, his goal is to continue with weight loss efforts. He has  agreed to the Category 3 Plan.   Exercise goals: As is.   Behavioral modification strategies: increasing lean protein intake.  [name] [name] has agreed to follow-up with our clinic in 4 weeks. He was informed of the importance of frequent follow-up visits to maximize his success with intensive lifestyle modifications for his multiple health conditions.   [name] [name] was informed we would discuss his lab results at his next visit unless there is a critical issue that needs to be addressed sooner. [name] [name] agreed to keep his next visit at the agreed upon time to discuss these results.  Objective:   Blood pressure 123/84, pulse 69, temperature (!) 97.4 F (36.3 C), height 5\' 6"  (1.676 m), weight 216 lb (98 kg), SpO2 98 %. Body mass index is 34.86 kg/m.  Lab Results  Component Value Date   CREATININE 0.87 05/25/2023   BUN 24 05/25/2023   NA 140 05/25/2023   K 4.5 05/25/2023   CL 103 05/25/2023   CO2 21 05/25/2023   Lab Results  Component Value Date   ALT 18 05/25/2023   AST 19 05/25/2023   ALKPHOS 64 05/25/2023   BILITOT 0.8 05/25/2023   Lab Results  Component Value Date   HGBA1C 6.1 (H) 05/25/2023   HGBA1C 5.8 (H) 01/18/2023   HGBA1C 5.7 (H) 08/10/2022   HGBA1C 6.0 (H) 11/30/2021   HGBA1C 5.6 06/22/2021   Lab Results  Component Value Date   INSULIN 5.3 05/25/2023   INSULIN  6.3 01/18/2023   INSULIN 6.6 11/30/2021   INSULIN 6.2 06/22/2021   INSULIN 5.1 12/03/2020   Lab Results  Component Value Date   TSH 2.040 08/10/2022   Lab Results  Component Value Date   CHOL 152 05/25/2023   HDL 61 05/25/2023   LDLCALC 78 05/25/2023   TRIG 62 05/25/2023   CHOLHDL 2.5 08/10/2022   Lab Results  Component Value Date   VD25OH 60.5 05/25/2023   VD25OH 69.8 01/18/2023   VD25OH 73.1 08/10/2022   Lab Results  Component Value Date   WBC 5.3 09/13/2022   HGB 15.7 09/13/2022   HCT 46.7 09/13/2022   MCV 97 09/13/2022   PLT 177 09/13/2022   No results found for: "IRON", "TIBC",  "FERRITIN"  Attestation Statements:   Reviewed by clinician on day of visit: allergies, medications, problem list, medical history, surgical history, family history, social history, and previous encounter notes.   I, Burt Knack, am acting as transcriptionist for Quillian Quince, MD.  I have reviewed the above documentation for accuracy and completeness, and I agree with the above. -  Quillian Quince, MD

## 2023-07-05 DIAGNOSIS — G4733 Obstructive sleep apnea (adult) (pediatric): Secondary | ICD-10-CM | POA: Diagnosis not present

## 2023-07-06 ENCOUNTER — Ambulatory Visit (INDEPENDENT_AMBULATORY_CARE_PROVIDER_SITE_OTHER): Payer: Medicare Other | Admitting: Family Medicine

## 2023-07-06 ENCOUNTER — Encounter (INDEPENDENT_AMBULATORY_CARE_PROVIDER_SITE_OTHER): Payer: Self-pay | Admitting: Family Medicine

## 2023-07-06 VITALS — BP 119/66 | HR 56 | Temp 97.4°F | Ht 66.0 in | Wt 211.0 lb

## 2023-07-06 DIAGNOSIS — E65 Localized adiposity: Secondary | ICD-10-CM | POA: Diagnosis not present

## 2023-07-06 DIAGNOSIS — E669 Obesity, unspecified: Secondary | ICD-10-CM | POA: Diagnosis not present

## 2023-07-06 DIAGNOSIS — E559 Vitamin D deficiency, unspecified: Secondary | ICD-10-CM

## 2023-07-06 DIAGNOSIS — Z6834 Body mass index (BMI) 34.0-34.9, adult: Secondary | ICD-10-CM | POA: Diagnosis not present

## 2023-07-06 NOTE — Progress Notes (Unsigned)
Chief Complaint:   OBESITY [name] [name] is here to discuss his progress with his obesity treatment plan along with follow-up of his obesity related diagnoses. [name] [name] is on the Category 3 Plan and states he is following his eating plan approximately 80% of the time. 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    Today's visit was #: 53 Starting weight: 237 lbs Starting date: 08/18/2017 Today's weight: 211 lbs Today's date: 07/06/2023 Total lbs lost to date: 26 Total lbs lost since last in-office visit: 5  Interim History: Patient has done well with his weight loss.  He did some celebration eating, but he did well with getting back on track.  He has cut out his daily snacks and he is working on portion control.  Subjective:   1. Vitamin D deficiency Patient is stable on vitamin D with no side effects noted.  He is doing more outdoors as well.  2. Central adiposity Patient has questions about his visceral weight and what he can do specifically to lose weight in his abdomen.  Assessment/Plan:   1. Vitamin D deficiency Patient will continue vitamin D 5000 IU once daily, and we will recheck labs in 2 months.  2. Central adiposity Patient was educated on visceral versus subcutaneous fat as well as how certain foods increase insulin and increased visceral fat.  He will continue with his diet and exercise as is, and we will continue to follow.  3. BMI 34.0-34.9,adult  4. Obesity, Beginning BMI 35.0 [name] [name] is currently in the action stage of change. As such, his goal is to continue with weight loss efforts. He has agreed to the Category 3 Plan.   Exercise goals: As is.   Behavioral modification strategies: better snacking choices.  [name] [name] has agreed to follow-up with our clinic in 8 weeks. He was informed of the importance of frequent follow-up visits to maximize his success with intensive lifestyle modifications for his multiple health conditions.   Objective:   Blood pressure  119/66, pulse (!) 56, temperature (!) 97.4 F (36.3 C), height 5\' 6"  (1.676 m), weight 211 lb (95.7 kg), SpO2 99 %. Body mass index is 34.06 kg/m.  Lab Results  Component Value Date   CREATININE 0.87 05/25/2023   BUN 24 05/25/2023   NA 140 05/25/2023   K 4.5 05/25/2023   CL 103 05/25/2023   CO2 21 05/25/2023   Lab Results  Component Value Date   ALT 18 05/25/2023   AST 19 05/25/2023   ALKPHOS 64 05/25/2023   BILITOT 0.8 05/25/2023   Lab Results  Component Value Date   HGBA1C 6.1 (H) 05/25/2023   HGBA1C 5.8 (H) 01/18/2023   HGBA1C 5.7 (H) 08/10/2022   HGBA1C 6.0 (H) 11/30/2021   HGBA1C 5.6 06/22/2021   Lab Results  Component Value Date   INSULIN 5.3 05/25/2023   INSULIN 6.3 01/18/2023   INSULIN 6.6 11/30/2021   INSULIN 6.2 06/22/2021   INSULIN 5.1 12/03/2020   Lab Results  Component Value Date   TSH 2.040 08/10/2022   Lab Results  Component Value Date   CHOL 152 05/25/2023   HDL 61 05/25/2023   LDLCALC 78 05/25/2023   TRIG 62 05/25/2023   CHOLHDL 2.5 08/10/2022   Lab Results  Component Value Date   VD25OH 60.5 05/25/2023   VD25OH 69.8 01/18/2023   VD25OH 73.1 08/10/2022   Lab Results  Component Value Date   WBC 5.3 09/13/2022   HGB 15.7 09/13/2022   HCT  46.7 09/13/2022   MCV 97 09/13/2022   PLT 177 09/13/2022   No results found for: "IRON", "TIBC", "FERRITIN"  Attestation Statements:   Reviewed by clinician on day of visit: allergies, medications, problem list, medical history, surgical history, family history, social history, and previous encounter notes.  Time spent on visit including pre-visit chart review and post-visit care and charting was 42 minutes.   I, Burt Knack, am acting as transcriptionist for Quillian Quince, MD.  I have reviewed the above documentation for accuracy and completeness, and I agree with the above. -  Quillian Quince, MD

## 2023-07-10 NOTE — Assessment & Plan Note (Signed)
Supplement and monitor 

## 2023-07-10 NOTE — Assessment & Plan Note (Signed)
Rate controlled. Is in a clinical trial with [facility] and the it is unclear if he is on Eliquis or the study medicine for the next 2-3 years.

## 2023-07-10 NOTE — Assessment & Plan Note (Signed)
hgba1c acceptable, minimize simple carbs. Increase exercise as tolerated.  

## 2023-07-10 NOTE — Progress Notes (Signed)
Subjective:    Patient ID: [name] [name], male    DOB: 03/03/1941, 82 y.o.   MRN: [id number]  Chief Complaint  Patient presents with  . Follow-up    Follow up     HPI Discussed the use of AI scribe software for clinical note transcription with the patient, who gave verbal consent to proceed.  History of Present Illness   The patient, with a history of weight issues, post-herpetic neuralgia from shingles, and an irregular heart rate, presents for a routine check-up. He has been working with a dietitian, Dr. Dalbert Garnet, to manage his weight and has seen significant improvements in his overall health, including reduced heartburn and improved blood test results. The patient reports feeling much better since losing weight and is currently maintaining his weight loss with a balanced diet plan. He also reports taking nine gabapentin tablets daily to manage his post-herpetic neuralgia, which is effective to some degree but does not completely alleviate the symptoms. The patient's spouse mentions a potential new treatment involving a patch, which he heard about from his daughter, a pharmacist.    Patient is a 82 yo male in today for follow up on chronic medical concerns. No recent febrile illness or hospitalizations. Denies CP/palp/SOB/HA/congestion/fevers/GI or GU c/o. Taking meds as prescribed     Past Medical History:  Diagnosis Date  . Atrial fibrillation (HCC)   . BPH (benign prostatic hypertrophy)   . Bradycardia 12/30/2014   HR routinely in mid 40s-50s  . Coronary artery disease (CAD) excluded 03/2015   False-positive nuclear stress test suggesting inferior ischemia  . Dysrhythmia   . Elevated PSA   . Encounter for Medicare annual wellness exam 09/22/2013   Sees Dr Karlyn Agee of Derm Sees Dr Stan Head of Gastroenterology Sees Dr Ihor Gully of [facility] Sees Dr Normand Sloop of Optometry  . GERD (gastroesophageal reflux disease)    controlled w/ diet and behavioral  changes, history of  . Grade I diastolic dysfunction 2016  . History of kidney stones   . Hypertriglyceridemia 03/16/2015  . Kidney stones   . Lymphadenitis   . Melanoma of back (HCC)    Excised Dr Karlyn Agee  . Mild ascending aorta dilatation (HCC)   . New onset a-fib (HCC) 03/10/2015  . OA (osteoarthritis)   . Otitis, externa, infective 08/10/2015  . Palpitations   . Pneumonia 2004  . Post herpetic neuralgia   . Shingles 07/09/2013  . Sleep apnea 07/30/2011   cpap- 13   . Snoring disorder 07/30/2011    Past Surgical History:  Procedure Laterality Date  . CATARACT EXTRACTION Right   . COLONOSCOPY  2018  . CYSTOSCOPY/URETEROSCOPY/HOLMIUM LASER/STENT PLACEMENT Left 10/05/2019   Procedure: CYSTOSCOPY/RETROGRADE/URETEROSCOPY/HOLMIUM LASER/STENT PLACEMENT;  Surgeon: Ihor Gully, MD;  Location[facility];  Service: Urology;  Laterality: Left;  . EXTRACORPOREAL SHOCK WAVE LITHOTRIPSY Left 01/25/2022   Procedure: EXTRACORPOREAL SHOCK WAVE LITHOTRIPSY (ESWL);  Surgeon: Heloise Purpura, MD;  Location: [facility];  Service: Urology;  Laterality: Left;  . EYE SURGERY Left 12/06/2017   cataract by Dr Ranae Pila  . INGUINAL HERNIA REPAIR Bilateral 06/01/2019   Procedure: LAPAROSCOPIC LEFT AND  RIGHT INGUINAL HERNIA REPAIR WITH MESH;  Surgeon: Karie Soda, MD;  Location: [facility];  Service: General;  Laterality: Bilateral;  . LEFT HEART CATHETERIZATION WITH CORONARY ANGIOGRAM N/A 04/10/2015   Procedure: LEFT HEART CATHETERIZATION WITH  CORONARY ANGIOGRAM;  Surgeon: Marykay Lex, MD;  Location: [facility] CATH LAB;  Service:  Cardiovascular;  Angiographicallly NORMAL CORONARY ARTERIES  . MASS EXCISION N/A 05/16/2014   Procedure: EXCISION POSTERIOR NECK MASS, RIGHT CHEST WALL MASS AND RIGHT AXILLARY MASS AXILLARY NODE DISSECTION;  Surgeon: Ardeth Sportsman, MD;  Location: [facility];  Service: General;  Laterality: N/A;  . melanoma removal    . MOUTH SURGERY  N/A   . NM MYOVIEW LTD  03/19/2015   FALSE POSITIVE:  INTERMEDIATE RISK. Small sized, moderate intensity inferior ischemic perfusion defect  . REVERSE SHOULDER ARTHROPLASTY Left 10/16/2020   Procedure: REVERSE SHOULDER ARTHROPLASTY;  Surgeon: Francena Hanly, MD;  Location: [facility];  Service: Orthopedics;  Laterality: Left;   . TRANSTHORACIC ECHOCARDIOGRAM  03/20/2015   Normal LV size and function. EF 60-65%. G1 DD. Trivial AI. Borderline aortic root dilation (41 mm), Mild LA dilation.  Marland Kitchen VEIN LIGATION AND STRIPPING Left 07/13/2021   Procedure: LIGATION AND STRIPPING OF LEFT GREAT SAPHENOUS VEIN WITH STAB PHLEBECTOMY GREATER THAN 20 INCISIONS TO LEFT LEG;  Surgeon: Chuck Hint, MD;  Location: [facility] OR;  Service: Vascular;  Laterality: Left;    Family History  Problem Relation Age of Onset  . Cancer Mother 71       MM, leukemia  . Obesity Mother   . Emphysema Father 81  . Obesity Father   . Alcohol abuse Father   . Cancer Sister 44       brain  . Diabetes Sister 25  . Obesity Sister   . COPD Brother 46  . Down syndrome Brother 59       aspirated  . Diabetes Son   . Pancreatitis Son   . Heart disease Paternal Grandmother   . COPD Paternal Grandfather   . Alcohol abuse Other   . Colon cancer Neg Hx     Social History   Socioeconomic History  . Marital status: Married    Spouse name: [name]  . Number of children: 2  . Years of education: Not on file  . Highest education level: Associate degree: occupational, Scientist, product/process development, or vocational program  Occupational History  . Occupation: retired    Comment: truck Hospital doctor  Tobacco Use  . Smoking status: Former    Types: Pipe, Cigars    Quit date: 02/11/1989    Years since quitting: 34.4  . Smokeless tobacco: Never  Vaping Use  . Vaping status: Never Used  Substance and Sexual Activity  . Alcohol use: Yes    Alcohol/week: 7.0 standard drinks of alcohol    Types: 7 Glasses of wine per week    Comment: 1 per day --  brandy or wine  . Drug use: No  . Sexual activity: Yes    Comment: lives with wife, retired from [employer] driving, no dietary restrictions  Other Topics Concern  . Not on file  Social History Narrative   He is a married father of 2, and grandfather of 4. He has been married to his wife [name] for 51 years. He previously lived in [place] but has moved with his wife to [place], [place] to be close to his daughter [name] and her family. [name] is our Teacher, music.   He previously worked as a Naval architect and had 2 years of college after high school. He quit smoking in 1990. He has 6-7 glasses of brandy or wine a week.    He exercises regularly with low impact aerobics 2 days a week and daily walks of 30-45 minutes. His exercise sessions or 60 minutes. He will do some type  of exercise at least 7 days a week and is very active doing yard work and wood work.      Patient is right-handed. He lives with his wife in a one level home with a basement. He drinks 4-5 cups of decaf coffee a day. He and his wife go to the gym 1-2 x a week.   Social Determinants of Health   Financial Resource Strain: Low Risk  (07/04/2023)   Overall Financial Resource Strain (CARDIA)   . Difficulty of Paying Living Expenses: Not hard at all  Food Insecurity: No Food Insecurity (07/04/2023)   Hunger Vital Sign   . Worried About Programme researcher, broadcasting/film/video in the Last Year: Never true   . Ran Out of Food in the Last Year: Never true  Transportation Needs: No Transportation Needs (07/04/2023)   PRAPARE - Transportation   . Lack of Transportation (Medical): No   . Lack of Transportation (Non-Medical): No  Physical Activity: Sufficiently Active (07/04/2023)   Exercise Vital Sign   . Days of Exercise per Week: 7 days   . Minutes of Exercise per Session: 60 min  Stress: No Stress Concern Present (07/04/2023)   [employer] of Occupational Health - Occupational Stress Questionnaire   . Feeling of Stress : Not at all  Social  Connections: Moderately Isolated (07/04/2023)   Social Connection and Isolation Panel [NHANES]   . Frequency of Communication with Friends and Family: Three times a week   . Frequency of Social Gatherings with Friends and Family: More than three times a week   . Attends Religious Services: Never   . Active Member of Clubs or Organizations: No   . Attends Banker Meetings: Not on file   . Marital Status: Married  Catering manager Violence: Not At Risk (08/05/2021)   Humiliation, Afraid, Rape, and Kick questionnaire   . Fear of Current or Ex-Partner: No   . Emotionally Abused: No   . Physically Abused: No   . Sexually Abused: No    Outpatient Medications Prior to Visit  Medication Sig Dispense Refill  . Cholecalciferol (VITAMIN D3) 125 MCG (5000 UT) CAPS Take 1 capsule (5,000 Units total) by mouth daily. (Patient taking differently: Take 5,000 Units by mouth daily.) 30 capsule 0  . ELIQUIS 5 MG TABS tablet Take 1 tablet by mouth twice daily 180 tablet 1  . influenza vaccine adjuvanted (FLUAD QUADRIVALENT) 0.5 ML injection Inject into the muscle. 0.5 mL 0  . Multiple Vitamins-Minerals (MULTIVITAMIN MEN 50+) TABS Take by mouth daily.    Marland Kitchen OVER THE COUNTER MEDICATION Apply 1 application topically daily as needed (Pain). Hemp freeze    . sildenafil (VIAGRA) 100 MG tablet Take 100 mg by mouth daily as needed for erectile dysfunction.    . tamsulosin (FLOMAX) 0.4 MG CAPS capsule Take 1 capsule (0.4 mg total) by mouth at bedtime. 14 capsule 0  . gabapentin (NEURONTIN) 300 MG capsule Take 3 capsules (900 mg total) by mouth 3 (three) times daily. 270 capsule 3   No facility-administered medications prior to visit.    No Known Allergies  Review of Systems  Constitutional:  Negative for fever and malaise/fatigue.  HENT:  Negative for congestion.   Eyes:  Negative for blurred vision.  Respiratory:  Negative for shortness of breath.   Cardiovascular:  Negative for chest pain,  palpitations and leg swelling.  Gastrointestinal:  Negative for abdominal pain, blood in stool and nausea.  Genitourinary:  Negative for dysuria and frequency.  Musculoskeletal:  Positive for back pain. Negative for falls.  Skin:  Negative for rash.  Neurological:  Negative for dizziness, loss of consciousness and headaches.  Endo/Heme/Allergies:  Negative for environmental allergies.  Psychiatric/Behavioral:  Negative for depression. The patient is not nervous/anxious.        Objective:    Physical Exam Vitals reviewed.  Constitutional:      Appearance: Normal appearance. He is not ill-appearing.  HENT:     Head: Normocephalic and atraumatic.     Nose: Nose normal.  Eyes:     Conjunctiva/sclera: Conjunctivae normal.  Cardiovascular:     Rate and Rhythm: Normal rate.     Pulses: Normal pulses.     Heart sounds: Normal heart sounds. No murmur heard. Pulmonary:     Effort: Pulmonary effort is normal.     Breath sounds: Normal breath sounds. No wheezing.  Abdominal:     Palpations: Abdomen is soft. There is no mass.     Tenderness: There is no abdominal tenderness.  Musculoskeletal:     Cervical back: Normal range of motion.     Right lower leg: No edema.     Left lower leg: No edema.  Skin:    General: Skin is warm and dry.  Neurological:     General: No focal deficit present.     Mental Status: He is alert and oriented to person, place, and time.  Psychiatric:        Mood and Affect: Mood normal.   BP 116/72 (BP Location: Left Arm, Patient Position: Sitting, Cuff Size: Normal)   Pulse 66   Temp 97.8 F (36.6 C) (Oral)   Resp 16   Ht 5\' 6"  (1.676 m)   Wt 217 lb (98.4 kg)   SpO2 97%   BMI 35.02 kg/m  Wt Readings from Last 3 Encounters:  07/11/23 217 lb (98.4 kg)  07/06/23 211 lb (95.7 kg)  05/25/23 216 lb (98 kg)    Diabetic Foot Exam - Simple   No data filed    Lab Results  Component Value Date   WBC 5.3 09/13/2022   HGB 15.7 09/13/2022   HCT 46.7  09/13/2022   PLT 177 09/13/2022   GLUCOSE 84 05/25/2023   CHOL 152 05/25/2023   TRIG 62 05/25/2023   HDL 61 05/25/2023   LDLCALC 78 05/25/2023   ALT 18 05/25/2023   AST 19 05/25/2023   NA 140 05/25/2023   K 4.5 05/25/2023   CL 103 05/25/2023   CREATININE 0.87 05/25/2023   BUN 24 05/25/2023   CO2 21 05/25/2023   TSH 2.040 08/10/2022   PSA 4.94 (H) 01/15/2010   INR 1.22 04/04/2015   HGBA1C 6.1 (H) 05/25/2023    Lab Results  Component Value Date   TSH 2.040 08/10/2022   Lab Results  Component Value Date   WBC 5.3 09/13/2022   HGB 15.7 09/13/2022   HCT 46.7 09/13/2022   MCV 97 09/13/2022   PLT 177 09/13/2022   Lab Results  Component Value Date   NA 140 05/25/2023   K 4.5 05/25/2023   CO2 21 05/25/2023   GLUCOSE 84 05/25/2023   BUN 24 05/25/2023   CREATININE 0.87 05/25/2023   BILITOT 0.8 05/25/2023   ALKPHOS 64 05/25/2023   AST 19 05/25/2023   ALT 18 05/25/2023   PROT 6.6 05/25/2023   ALBUMIN 4.1 05/25/2023   CALCIUM 9.1 05/25/2023   ANIONGAP 11 12/29/2021   EGFR 87 05/25/2023   GFR 80.84 10/11/2019   Lab  Results  Component Value Date   CHOL 152 05/25/2023   Lab Results  Component Value Date   HDL 61 05/25/2023   Lab Results  Component Value Date   LDLCALC 78 05/25/2023   Lab Results  Component Value Date   TRIG 62 05/25/2023   Lab Results  Component Value Date   CHOLHDL 2.5 08/10/2022   Lab Results  Component Value Date   HGBA1C 6.1 (H) 05/25/2023       Assessment & Plan:  Permanent atrial fibrillation (HCC) Assessment & Plan: Rate controlled. Is in a clinical trial with [facility] and the it is unclear if he is on Eliquis or the study medicine for the next 2-3 years.    Vitamin D deficiency Assessment & Plan: Supplement and monitor   Muscle cramps Assessment & Plan: Hydrate and monitor   Hyperglycemia Assessment & Plan: hgba1c acceptable, minimize simple carbs. Increase exercise as tolerated.    Other orders -     Gabapentin;  Take 3 capsules (900 mg total) by mouth 3 (three) times daily.  Dispense: 270 capsule; Refill: 3    Assessment and Plan    Weight Management: Successful weight loss with improved energy and reduced heartburn. Continues to follow a balanced diet plan with high protein and low carbs. -Continue current diet plan and regular weigh-ins for self-monitoring.  Gastroesophageal Reflux Disease (GERD): Significant improvement with weight loss, no current symptoms. -Continue weight management strategies.  Type 2 Diabetes Mellitus: A1c slightly increased to 6.1 from 5.8, but still within target range. No hypoglycemic episodes reported. -Continue current management plan.  Postherpetic Neuralgia: Chronic pain managed with high-dose Gabapentin. Patient reported potential new treatment option via sports medicine doctor. -Renew Gabapentin prescription. -Investigate potential new treatment option once patient provides further information.  Atrial Fibrillation: Patient on Eliquis, no reported symptoms. Noted low pulse rate during visit, but no associated symptoms. -Continue Eliquis. -Monitor for symptoms of low pulse rate such as shortness of breath or fatigue.  Hydration: Discussed importance of regular hydration, especially in heat, and potential link between chronic dehydration and dementia. -Encourage regular intake of water and electrolyte beverages, avoiding artificial sweeteners.  Follow-up: Plan for physical in 4-6 months.         Danise Edge, MD

## 2023-07-10 NOTE — Assessment & Plan Note (Signed)
Hydrate and monitor 

## 2023-07-11 ENCOUNTER — Other Ambulatory Visit: Payer: Self-pay | Admitting: Family Medicine

## 2023-07-11 ENCOUNTER — Ambulatory Visit (INDEPENDENT_AMBULATORY_CARE_PROVIDER_SITE_OTHER): Payer: Medicare Other | Admitting: Family Medicine

## 2023-07-11 ENCOUNTER — Encounter: Payer: Self-pay | Admitting: Family Medicine

## 2023-07-11 VITALS — BP 116/72 | HR 66 | Temp 97.8°F | Resp 16 | Ht 66.0 in | Wt 217.0 lb

## 2023-07-11 DIAGNOSIS — R252 Cramp and spasm: Secondary | ICD-10-CM

## 2023-07-11 DIAGNOSIS — E559 Vitamin D deficiency, unspecified: Secondary | ICD-10-CM

## 2023-07-11 DIAGNOSIS — R739 Hyperglycemia, unspecified: Secondary | ICD-10-CM

## 2023-07-11 DIAGNOSIS — B0229 Other postherpetic nervous system involvement: Secondary | ICD-10-CM

## 2023-07-11 DIAGNOSIS — I4821 Permanent atrial fibrillation: Secondary | ICD-10-CM

## 2023-07-11 MED ORDER — GABAPENTIN 300 MG PO CAPS: 900.0000 mg | ORAL_CAPSULE | Freq: Three times a day (TID) | ORAL | 3 refills | Status: DC

## 2023-07-11 NOTE — Patient Instructions (Signed)
Postherpetic Neuralgia Postherpetic neuralgia (PHN) is nerve pain that occurs after a shingles infection. Shingles is a painful rash that appears on one area of the body, usually on the trunk or face. Shingles is caused by the varicella-zoster virus. This is the same virus that causes chickenpox. In people who have had chickenpox, the virus can resurface years later and cause shingles. PHN appears in the same area where you had the shingles rash. The pain usually goes away after the rash disappears. You may have PHN if you continue to have pain 3 months after your shingles rash has gone away. What are the causes? This condition is caused by damage to your nerves due to inflammation from the varicella-zoster virus. The damage makes your nerves overly sensitive. What increases the risk? The following factors may make you more likely to develop this condition: Being older than 80 years of age. Having severe pain before your shingles rash starts. Having a severe rash. Having shingles in and around the eye area. Having a disease or taking medicine that causes you to have a weakened disease-fighting system (immune system). What are the signs or symptoms? The main symptom of this condition is pain. The pain may: Often be severe and may be described as stabbing, burning, shooting, or feeling like an electric shock. Come and go, or it may be there all the time. Be triggered by light touches on the skin or changes in temperature. You may have itching along with the pain. How is this diagnosed? This condition may be diagnosed based on your symptoms and your history of shingles. Lab studies and other diagnostic tests are usually not needed. How is this treated? There is no cure for this condition. Treatment for PHN will focus on pain relief. Over-the-counter pain relievers do not usually relieve PHN pain. You may need to work with a pain specialist. Treatment may include: Anti-seizure medicines to relieve  nerve pain. Antidepressant medicines to help with pain and improve sleep. A numbing patch worn on the skin (lidocaine patch). Strong pain relievers (opioids). Injections of numbing medicine or anti-inflammatory medicines around irritated nerves. Injections of botulinum toxin to block pain signals between nerves and muscles. Follow these instructions at home: Medicines Take over-the-counter and prescription medicines only as told by your health care provider. Ask your health care provider if the medicine prescribed to you: Requires you to avoid driving or using machinery. Can cause constipation. You may need to take these actions to prevent or treat constipation: Drink enough fluid to keep your urine pale yellow. Take over-the-counter or prescription medicines. Eat foods that are high in fiber, such as beans, whole grains, and fresh fruits and vegetables. Limit foods that are high in fat and processed sugars, such as fried or sweet foods. Managing pain  If directed, put ice on the painful area. To do this: Put ice in a plastic bag. Place a towel between your skin and the bag. Leave the ice on for 20 minutes, 2-3 times a day. Remove the ice if your skin turns bright red. This is very important. If you cannot feel pain, heat, or cold, you have a greater risk of damage to the area. Cover sensitive areas with a bandage (dressing) to reduce friction from clothing rubbing on the area. General instructions It may take a long time to recover from PHN. Work closely with your health care provider and develop a good support system at home. You may consider joining a support group. Wear loose, comfortable clothing. Talk   to your health care provider if you feel depressed or desperate. Living with long-term pain can be depressing. Keep all follow-up visits. This is important. How is this prevented? Getting a vaccination for shingles can prevent PHN. The shingles vaccine is recommended for people older  than age 61. It may prevent shingles and may also lower your risk of PHN if you do get shingles. Contact a health care provider if: Your medicine is not helping. You are struggling to manage your pain at home. Get help right away if: You have thoughts about hurting yourself or others. If you ever feel like you may hurt yourself or others, or have thoughts about taking your own life, get help right away. Go to your nearest emergency department or: Call your local emergency services (911 in the [place] Call a suicide crisis helpline, such as the National Suicide Prevention Lifeline at 671-374-4677 or 988 in the [place] This is open 24 hours a day in the [place] Text the Crisis Text Line at (731)551-6388 (in the [place] Summary Postherpetic neuralgia (PHN) is a very painful disorder that can occur after an episode of shingles. The pain is often severe and may be described as stabbing, burning, shooting, or feeling like an electric shock. Prescription medicines can be helpful in managing persistent pain. Getting a vaccination for shingles can prevent PHN. This vaccine is recommended for people older than age 6. This information is not intended to replace advice given to you by your health care provider. Make sure you discuss any questions you have with your health care provider. Document Revised: 07/08/2021 Document Reviewed: 12/08/2020 Elsevier Patient Education  2024 [employer].

## 2023-07-13 ENCOUNTER — Encounter: Payer: Self-pay | Admitting: Family Medicine

## 2023-07-14 ENCOUNTER — Encounter: Payer: Self-pay | Admitting: Physical Medicine & Rehabilitation

## 2023-07-27 ENCOUNTER — Encounter (INDEPENDENT_AMBULATORY_CARE_PROVIDER_SITE_OTHER): Payer: Self-pay

## 2023-07-28 ENCOUNTER — Other Ambulatory Visit: Payer: Self-pay | Admitting: Family Medicine

## 2023-07-28 ENCOUNTER — Encounter: Payer: Self-pay | Admitting: Family Medicine

## 2023-07-28 DIAGNOSIS — R051 Acute cough: Secondary | ICD-10-CM

## 2023-07-29 ENCOUNTER — Ambulatory Visit (HOSPITAL_BASED_OUTPATIENT_CLINIC_OR_DEPARTMENT_OTHER)
Admission: RE | Admit: 2023-07-29 | Discharge: 2023-07-29 | Disposition: A | Discharge status: Home / Self Care | Payer: Medicare Other | Source: Ambulatory Visit | Attending: Family Medicine | Admitting: Family Medicine

## 2023-07-29 DIAGNOSIS — R051 Acute cough: Secondary | ICD-10-CM | POA: Diagnosis not present

## 2023-07-29 DIAGNOSIS — K449 Diaphragmatic hernia without obstruction or gangrene: Secondary | ICD-10-CM | POA: Diagnosis not present

## 2023-07-29 DIAGNOSIS — Z96612 Presence of left artificial shoulder joint: Secondary | ICD-10-CM | POA: Diagnosis not present

## 2023-07-29 DIAGNOSIS — R059 Cough, unspecified: Secondary | ICD-10-CM | POA: Diagnosis not present

## 2023-08-13 NOTE — Progress Notes (Unsigned)
HPI M former pipe smoker followed for OSA, complicated by HTN, CAD, Gr 1 DD, PAFib, GERD, Melanoma, Obesity, Varicose veins, Kidney Stone, NPSG 11/13/13- AHI 37/ hr, desaturation to 81%, CPAP to 13, body weight 220 lbs  ===========================================================   08/12/22-  81 yoM former pipe smoker followed for OSA, complicated by HTN, CAD, Gr 1 DD, PAFib, GERD, Melanoma, Obesity, Varicose veins, Kidney Stone,  CPAP autyo 5-15/ [facility]    replacement machine ordered 08/10/21 Download compliance-100%, AHI 13.8/ hr     mostly centrals Body weight today-223 lbs Covid vax- -----Pt f/u getting approx 7-8hrs/night plus some scattered napping w/o CPAP. Pt believes machine is working well, no questions or concerns Wife indicates she is comfortable with what she can hear at night. Takes an occasional nap but thinks he is sleeping well. Cardiology is following for atrial fibrillation.  08/15/23- 82 yoM former pipe smoker followed for OSA, complicated by HTN, CAD, Gr 1 DD, PAFib/Eliquis, GERD, Melanoma, Obesity, Varicose veins, Kidney Stone, Elastic sleeve after resection arm lipoma,  CPAP autyo 5-15/ [facility]    replacement machine ordered 08/10/21 Download compliance- Body weight today-  CXR 08/04/23- IMPRESSION: No active cardiopulmonary disease.  ROS-see HPI   + = positive Constitutional:    weight loss, night sweats, fevers, chills, fatigue, lassitude. HEENT:    headaches, difficulty swallowing, tooth/dental problems, sore throat,       sneezing, itching, ear ache, nasal congestion, post nasal drip, snoring CV:    chest pain, orthopnea, PND, swelling in lower extremities, anasarca,                                  dizziness, palpitations Resp:   shortness of breath with exertion or at rest.                productive cough,   non-productive cough, coughing up of blood.              change in color of mucus.  wheezing.   Skin:    rash or lesions. GI:  No-   heartburn,  indigestion, abdominal pain, nausea, vomiting, diarrhea,                 change in bowel habits, loss of appetite GU: dysuria, change in color of urine, no urgency or frequency.   flank pain. MS:   joint pain, stiffness, decreased range of motion, back pain. Neuro-     nothing unusual Psych:  change in mood or affect.  depression or anxiety.   memory loss.  OBJ- Physical Exam General- Alert, Oriented, Affect-appropriate, Distress- none acute, +overweight Skin- rash-none, lesions- none, excoriation- none Lymphadenopathy- none Head- atraumatic            Eyes- Gross vision intact, PERRLA, conjunctivae and secretions clear            Ears- Hearing, canals-normal            Nose- Clear, no-Septal dev, mucus, polyps, erosion, perforation             Throat- Mallampati II-III , mucosa clear , drainage- none, tonsils- atrophic, + teeth Neck- flexible , trachea midline, no stridor , thyroid nl, carotid no bruit Chest - symmetrical excursion , unlabored           Heart/CV- RRR , no murmur , no gallop  , no rub, nl s1 s2                           -  JVD- none , edema- none, stasis changes- none, varices- none           Lung- clear to P&A, wheeze- none, cough- none , dullness-none, rub- none           Chest wall-  Abd-  Br/ Gen/ Rectal- Not done, not indicated Extrem- +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

## 2023-08-15 ENCOUNTER — Ambulatory Visit: Payer: Medicare Other | Admitting: Internal Medicine

## 2023-08-15 ENCOUNTER — Encounter: Payer: Self-pay | Admitting: Internal Medicine

## 2023-08-15 VITALS — BP 122/82 | HR 84 | Temp 97.3°F | Ht 70.0 in | Wt 211.0 lb

## 2023-08-15 DIAGNOSIS — I4821 Permanent atrial fibrillation: Secondary | ICD-10-CM

## 2023-08-15 DIAGNOSIS — G4733 Obstructive sleep apnea (adult) (pediatric): Secondary | ICD-10-CM

## 2023-08-15 NOTE — Patient Instructions (Signed)
Glad you are doing well. We can  continue CPAP auto 5-15.  Please cal if we can help

## 2023-08-15 NOTE — Assessment & Plan Note (Signed)
Nearly regular today on exam. Followed by cardiology.

## 2023-08-15 NOTE — Assessment & Plan Note (Signed)
Benefits from CPAP "can't sleep without it". Medically insignificant number of central apneas- we can watch this. Plan- continue auto 5-15

## 2023-08-19 ENCOUNTER — Ambulatory Visit
Admission: EM | Admit: 2023-08-19 | Discharge: 2023-08-19 | Disposition: A | Discharge status: Home / Self Care | Payer: Medicare Other | Attending: Internal Medicine | Admitting: Internal Medicine

## 2023-08-19 DIAGNOSIS — S61208A Unspecified open wound of other finger without damage to nail, initial encounter: Secondary | ICD-10-CM | POA: Diagnosis not present

## 2023-08-19 DIAGNOSIS — S61301A Unspecified open wound of left index finger with damage to nail, initial encounter: Secondary | ICD-10-CM | POA: Diagnosis not present

## 2023-08-19 NOTE — Discharge Instructions (Addendum)
Keep the wound clean and dry.  Change the dressing daily or sooner if it becomes saturated/soiled.  Monitor for any signs of infection including redness, drainage, warmth and seek reevaluation if these occur.  Follow-up with your PCP in 2 days for recheck.  Please go to the ER for any worsening symptoms.  I hope you feel better soon!

## 2023-08-19 NOTE — ED Triage Notes (Signed)
Pt presents to UC w/ c/o cutting left hand pointer finger on saw blade yesterday afternoon. Currently bleeding.

## 2023-08-19 NOTE — ED Provider Notes (Signed)
[facility]    CSN: [id number] Arrival date & time: 08/19/23  4098      History   Chief Complaint No chief complaint on file.   HPI [name] [name] is a 82 y.o. male presents for evaluation of a finger injury.  Patient reports yesterday while using his table saw he excellently cut his left index finger.  States it cut through the distal part of the nail and into the skin.  He is on Eliquis.  He applied a dressing to help control the bleeding and came in for evaluation this morning as it was still bleeding.  No numbness or tingling.  He is up-to-date on his tetanus.  No other injuries or concerns at this time.  HPI  Past Medical History:  Diagnosis Date   Atrial fibrillation (HCC)    BPH (benign prostatic hypertrophy)    Bradycardia 12/30/2014   HR routinely in mid 40s-50s   Coronary artery disease (CAD) excluded 03/2015   False-positive nuclear stress test suggesting inferior ischemia   Dysrhythmia    Elevated PSA    Encounter for Medicare annual wellness exam 09/22/2013   Sees Dr Karlyn Agee of Derm Sees Dr Stan Head of Gastroenterology Sees Dr Ihor Gully of [facility] Sees Dr Normand Sloop of Optometry   GERD (gastroesophageal reflux disease)    controlled w/ diet and behavioral changes, history of   Grade I diastolic dysfunction 2016   History of kidney stones    Hypertriglyceridemia 03/16/2015   Kidney stones    Lymphadenitis    Melanoma of back (HCC)    Excised Dr Karlyn Agee   Mild ascending aorta dilatation (HCC)    New onset a-fib (HCC) 03/10/2015   OA (osteoarthritis)    Otitis, externa, infective 08/10/2015   Palpitations    Pneumonia 2004   Post herpetic neuralgia    Shingles 07/09/2013   Sleep apnea 07/30/2011   cpap- 13    Snoring disorder 07/30/2011    Patient Active Problem List   Diagnosis Date Noted   Central adiposity 07/06/2023   BMI 31.0-31.9,adult 01/18/2023   Obesity, Beginning BMI 35.0 01/18/2023   Dehydration  09/13/2022   Prediabetes 09/13/2022   Elevated MCV 09/13/2022   Hypercoagulable state due to permanent atrial fibrillation (HCC) 02/09/2022   Kidney stone 01/28/2022   S/P reverse total shoulder arthroplasty, left 10/24/2020   Preop cardiovascular exam 10/10/2020   Bilateral hearing loss 09/11/2020   Arthritis of left glenohumeral joint 06/25/2020   Muscle cramps 10/14/2019   Hearing loss of right ear 10/14/2019   Tinnitus of right ear 10/14/2019   Bilateral inguinal herniae (BIH) s/p lap repair w mesh 06/01/2019 06/01/2019   Right Femoral hernia s/p lap repair w mesh 06/01/2019 06/01/2019   Groin pain, left 12/11/2018   Class 2 severe obesity with serious comorbidity and body mass index (BMI) of 35.0 to 35.9 in adult (HCC) 08/18/2017   Other fatigue 08/18/2017   Vitamin D deficiency 08/18/2017   Hyperglycemia 08/18/2017   Lymphedema 09/21/2015   Preventative health care 09/21/2015   Abnormal exercise myocardial perfusion study 04/02/2015   Permanent atrial fibrillation (HCC) 03/16/2015   Hypertriglyceridemia 03/16/2015   Bradycardia 12/30/2014   Melanoma of back (HCC)    Mass of chest wall, right subfacial, 12cm s/p removal w rib resection 05/16/2014 03/08/2014   Mass of posterior neck, SQ 4cm 03/08/2014   Chest wall mass - right SQ anterior 5cm 08/21/2013   Post herpetic neuralgia 07/09/2013   OSA on  CPAP 07/30/2011   ELEVATED PROSTATE SPECIFIC ANTIGEN 01/13/2010   LIPOMA, SKIN 08/29/2008   GERD 08/29/2008   BENIGN PROSTATIC HYPERTROPHY 08/29/2008    Past Surgical History:  Procedure Laterality Date   CATARACT EXTRACTION Right    COLONOSCOPY  2018   CYSTOSCOPY/URETEROSCOPY/HOLMIUM LASER/STENT PLACEMENT Left 10/05/2019   Procedure: CYSTOSCOPY/RETROGRADE/URETEROSCOPY/HOLMIUM LASER/STENT PLACEMENT;  Surgeon: Ihor Gully, MD;  Location: [facility];  Service: Urology;  Laterality: Left;   EXTRACORPOREAL SHOCK WAVE LITHOTRIPSY Left 01/25/2022   Procedure:  EXTRACORPOREAL SHOCK WAVE LITHOTRIPSY (ESWL);  Surgeon: Heloise Purpura, MD;  Location[facility];  Service: Urology;  Laterality: Left;   EYE SURGERY Left 12/06/2017   cataract by Dr Dyanne Carrel HERNIA REPAIR Bilateral 06/01/2019   Procedure: LAPAROSCOPIC LEFT AND  RIGHT INGUINAL HERNIA REPAIR WITH MESH;  Surgeon: Karie Soda, MD;  Location: [facility];  Service: General;  Laterality: Bilateral;   LEFT HEART CATHETERIZATION WITH CORONARY ANGIOGRAM N/A 04/10/2015   Procedure: LEFT HEART CATHETERIZATION WITH  CORONARY ANGIOGRAM;  Surgeon: Marykay Lex, MD;  Location: [facility] CATH LAB;  Service: Cardiovascular;  Angiographicallly NORMAL CORONARY ARTERIES   MASS EXCISION N/A 05/16/2014   Procedure: EXCISION POSTERIOR NECK MASS, RIGHT CHEST WALL MASS AND RIGHT AXILLARY MASS AXILLARY NODE DISSECTION;  Surgeon: Ardeth Sportsman, MD;  Location: [facility];  Service: General;  Laterality: N/A;   melanoma removal     MOUTH SURGERY N/A    NM MYOVIEW LTD  03/19/2015   FALSE POSITIVE:  INTERMEDIATE RISK. Small sized, moderate intensity inferior ischemic perfusion defect   REVERSE SHOULDER ARTHROPLASTY Left 10/16/2020   Procedure: REVERSE SHOULDER ARTHROPLASTY;  Surgeon: Francena Hanly, MD;  Location: [facility];  Service: Orthopedics;  Laterality: Left;    TRANSTHORACIC ECHOCARDIOGRAM  03/20/2015   Normal LV size and function. EF 60-65%. G1 DD. Trivial AI. Borderline aortic root dilation (41 mm), Mild LA dilation.   VEIN LIGATION AND STRIPPING Left 07/13/2021   Procedure: LIGATION AND STRIPPING OF LEFT GREAT SAPHENOUS VEIN WITH STAB PHLEBECTOMY GREATER THAN 20 INCISIONS TO LEFT LEG;  Surgeon: Chuck Hint, MD;  Location: [facility];  Service: Vascular;  Laterality: Left;       Home Medications    Prior to Admission medications   Medication Sig Start Date End Date Taking? Authorizing Provider  Cholecalciferol (VITAMIN D3) 125 MCG (5000 UT) CAPS Take 1 capsule  (5,000 Units total) by mouth daily. Patient taking differently: Take 5,000 Units by mouth daily. 08/27/20   Quillian Quince D, MD  ELIQUIS 5 MG TABS tablet Take 1 tablet by mouth twice daily 05/27/23   Marykay Lex, MD  gabapentin (NEURONTIN) 300 MG capsule Take 3 capsules (900 mg total) by mouth 3 (three) times daily. 07/11/23   Bradd Canary, MD  influenza vaccine adjuvanted (FLUAD QUADRIVALENT) 0.5 ML injection Inject into the muscle. Patient not taking: Reported on 08/15/2023 10/07/22   Judyann Munson, MD  Multiple Vitamins-Minerals (MULTIVITAMIN MEN 50+) TABS Take by mouth daily.    [provider]  OVER THE COUNTER MEDICATION Apply 1 application topically daily as needed (Pain). Hemp freeze    [provider]  sildenafil (VIAGRA) 100 MG tablet Take 100 mg by mouth daily as needed for erectile dysfunction. 04/23/21   [provider]  tamsulosin (FLOMAX) 0.4 MG CAPS capsule Take 1 capsule (0.4 mg total) by mouth at bedtime. 01/25/22   Heloise Purpura, MD    Family History Family History  Problem Relation Age  of Onset   Cancer Mother 3       MM, leukemia   Obesity Mother    Emphysema Father 93   Obesity Father    Alcohol abuse Father    Cancer Sister 11       brain   Diabetes Sister 1   Obesity Sister    COPD Brother 55   Down syndrome Brother 81       aspirated   Diabetes Son    Pancreatitis Son    Heart disease Paternal Grandmother    COPD Paternal Grandfather    Alcohol abuse Other    Colon cancer Neg Hx     Social History Social History   Tobacco Use   Smoking status: Former    Types: Pipe, Cigars    Quit date: 02/11/1989    Years since quitting: 34.5   Smokeless tobacco: Never  Vaping Use   Vaping status: Never Used  Substance Use Topics   Alcohol use: Yes    Alcohol/week: 7.0 standard drinks of alcohol    Types: 7 Glasses of wine per week    Comment: 1 per day -- brandy or wine   Drug use: No     Allergies   Patient has no  known allergies.   Review of Systems Review of Systems  Skin:        Left index finger skin/nail avulsion     Physical Exam Triage Vital Signs ED Triage Vitals  Encounter Vitals Group     BP 08/19/23 0904 (!) 111/56     Systolic BP Percentile --      Diastolic BP Percentile --      Pulse Rate 08/19/23 0904 67     Resp 08/19/23 0904 16     Temp 08/19/23 0904 97.7 F (36.5 C)     Temp Source 08/19/23 0904 Oral     SpO2 08/19/23 0904 97 %     Weight --      Height --      Head Circumference --      Peak Flow --      Pain Score 08/19/23 0907 2     Pain Loc --      Pain Education --      Exclude from Growth Chart --    No data found.  Updated Vital Signs BP (!) 111/56 (BP Location: Right Arm)   Pulse 67   Temp 97.7 F (36.5 C) (Oral)   Resp 16   SpO2 97%   Visual Acuity Right Eye Distance:   Left Eye Distance:   Bilateral Distance:    Right Eye Near:   Left Eye Near:    Bilateral Near:     Physical Exam Vitals and nursing note reviewed.  Constitutional:      General: He is not in acute distress.    Appearance: Normal appearance. He is not ill-appearing.  HENT:     Head: Normocephalic and atraumatic.  Eyes:     Pupils: Pupils are equal, round, and reactive to light.  Cardiovascular:     Rate and Rhythm: Normal rate.  Pulmonary:     Effort: Pulmonary effort is normal.  Musculoskeletal:       Hands:     Comments: There is a nail avulsion to the distal left second finger.  Distal part of nail still attached.  There is superficial jagged laceration that extends to the distal medial aspect of the finger.  Mild bleeding.  No joint involvement.  Cap refill +2.  Skin:    General: Skin is warm and dry.  Neurological:     General: No focal deficit present.     Mental Status: He is alert and oriented to person, place, and time.  Psychiatric:        Mood and Affect: Mood normal.        Behavior: Behavior normal.      UC Treatments / Results  Labs (all  labs ordered are listed, but only abnormal results are displayed) Labs Reviewed - No data to display  EKG   Radiology No results found.  Procedures Procedures (including critical care time)  Medications Ordered in UC Medications - No data to display  Initial Impression / Assessment and Plan / UC Course  I have reviewed the triage vital signs and the nursing notes.  Pertinent labs & imaging results that were available during my care of the patient were reviewed by me and considered in my medical decision making (see chart for details).     Reviewed injury with patient.  Partial nail/skin avulsion.  No indication for closure given nature of wound.  Wound was thoroughly cleansed and dressed in clinic by nursing staff with pressure dressing and finger splint.  Wound care reviewed in length with patient as well as signs and symptoms of infection and he verbalized understanding.  He is up-to-date on his tetanus.  Follow-up with PCP 2 days for recheck.  ER precautions reviewed and patient verbalized understanding. Final Clinical Impressions(s) / UC Diagnoses   Final diagnoses:  Avulsion of skin of index finger, initial encounter  Avulsion of nail of left index finger     Discharge Instructions      Keep the wound clean and dry.  Change the dressing daily or sooner if it becomes saturated/soiled.  Monitor for any signs of infection including redness, drainage, warmth and seek reevaluation if these occur.  Follow-up with your PCP in 2 days for recheck.  Please go to the ER for any worsening symptoms.  I hope you feel better soon!    ED Prescriptions   None    PDMP not reviewed this encounter.   Radford Pax, NP 08/19/23 [phone]

## 2023-08-19 NOTE — ED Notes (Signed)
Per patient, last tetanus vaccine was 2023

## 2023-08-23 ENCOUNTER — Encounter: Payer: Self-pay | Admitting: Internal Medicine

## 2023-08-31 ENCOUNTER — Encounter: Payer: Medicare Other | Attending: Physical Medicine & Rehabilitation | Admitting: Physical Medicine & Rehabilitation

## 2023-08-31 ENCOUNTER — Encounter: Payer: Self-pay | Admitting: Physical Medicine & Rehabilitation

## 2023-08-31 ENCOUNTER — Ambulatory Visit (INDEPENDENT_AMBULATORY_CARE_PROVIDER_SITE_OTHER): Payer: Medicare Other | Admitting: Family Medicine

## 2023-08-31 ENCOUNTER — Encounter (INDEPENDENT_AMBULATORY_CARE_PROVIDER_SITE_OTHER): Payer: Self-pay | Admitting: Family Medicine

## 2023-08-31 VITALS — BP 118/72 | HR 74 | Temp 97.5°F | Ht 66.0 in | Wt 213.0 lb

## 2023-08-31 VITALS — BP 117/86 | HR 72 | Ht 66.0 in | Wt 221.0 lb

## 2023-08-31 DIAGNOSIS — E669 Obesity, unspecified: Secondary | ICD-10-CM

## 2023-08-31 DIAGNOSIS — E559 Vitamin D deficiency, unspecified: Secondary | ICD-10-CM

## 2023-08-31 DIAGNOSIS — Z6834 Body mass index (BMI) 34.0-34.9, adult: Secondary | ICD-10-CM | POA: Diagnosis not present

## 2023-08-31 DIAGNOSIS — B0229 Other postherpetic nervous system involvement: Secondary | ICD-10-CM | POA: Insufficient documentation

## 2023-08-31 NOTE — Progress Notes (Signed)
Subjective:    Patient ID: [name] [name], male    DOB: [birth year], 82 y.o.   MRN: [id number]  HPI  This is an initial evaluation for [name] [name] who is here for assessment and treatment for post-herpetic neuralgia related pain. He had an attack of the shingles 10 years ago in his right axillary area. He has been on lyrica and gabapentin for pain over the years. Gabapentin has helped his pain to an extent but he still has constant pain which can fluctuate in intensity throughout the day. It tends to bother him more whne he is idle. Lyrica didn't provide much relief at all. The 900mg  of gabapentin does provide some relative relief. He has also tried numerous creams and ointments without benefit. He also had nerve root blocks as well it appears. Ultimately he would like to be off gabapentin which is 2700mg  daily.   Otherwise he feels good. He wears a right arm sleeve to control lymphedema. He stays active at home.        Pain Inventory Average Pain 4 Pain Right Now 2 My pain is tingling  In the last 24 hours, has pain interfered with the following? General activity 1 Relation with others 1 Enjoyment of life 1 What TIME of day is your pain at its worst? morning  and night Sleep (in general) Good  Pain is worse with: inactivity Pain improves with: rest and medication Relief from Meds: 5  how many minutes can you walk? 2 hours do you drive?  yes Do you have any goals in this area?  yes  retired  No problems in this area  Any changes since last visit?  no  Any changes since last visit?  no    Family History  Problem Relation Age of Onset   Cancer Mother 50       MM, leukemia   Obesity Mother    Emphysema Father 68   Obesity Father    Alcohol abuse Father    Cancer Sister 79       brain   Diabetes Sister 43   Obesity Sister    COPD Brother 35   Down syndrome Brother 44       aspirated   Diabetes Son    Pancreatitis Son    Heart disease Paternal Grandmother     COPD Paternal Grandfather    Alcohol abuse Other    Colon cancer Neg Hx    Social History   Socioeconomic History   Marital status: Married    Spouse name: [name] [name]   Number of children: 2   Years of education: Not on file   Highest education level: Associate degree: occupational, Scientist, product/process development, or vocational program  Occupational History   Occupation: retired    Comment: truck driver  Tobacco Use   Smoking status: Former    Types: Pipe, Cigars    Quit date: 02/11/1989    Years since quitting: 34.5   Smokeless tobacco: Never  Vaping Use   Vaping status: Never Used  Substance and Sexual Activity   Alcohol use: Yes    Alcohol/week: 7.0 standard drinks of alcohol    Types: 7 Glasses of wine per week    Comment: 1 per day -- brandy or wine   Drug use: No   Sexual activity: Yes    Comment: lives with wife, retired from [employer] driving, no dietary restrictions  Other Topics Concern   Not on file  Social History Narrative   He is a  married father of 2, and grandfather of 4. He has been married to his wife [name] [name] for 51 years. He previously lived in [place] but has moved with his wife to [place], [place] to be close to his daughter [name] [name] and her family. 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   He previously worked as a Naval architect and had 2 years of college after high school. He quit smoking in 1990. He has 6-7 glasses of brandy or wine a week.    He exercises regularly with low impact aerobics 2 days a week and daily walks of 30-45 minutes. His exercise sessions or 60 minutes. He will do some type of exercise at least 7 days a week and is very active doing yard work and wood work.      Patient is right-handed. He lives with his wife in a one level home with a basement. He drinks 4-5 cups of decaf coffee a day. He and his wife go to the gym 1-2 x a week.   Social Determinants of Health   Financial Resource Strain: Low Risk  (07/04/2023)   Overall Financial Resource Strain (CARDIA)     Difficulty of Paying Living Expenses: Not hard at all  Food Insecurity: No Food Insecurity (07/04/2023)   Hunger Vital Sign    Worried About Running Out of Food in the Last Year: Never true    Ran Out of Food in the Last Year: Never true  Transportation Needs: No Transportation Needs (07/04/2023)   PRAPARE - Administrator, Civil Service (Medical): No    Lack of Transportation (Non-Medical): No  Physical Activity: Sufficiently Active (07/04/2023)   Exercise Vital Sign    Days of Exercise per Week: 7 days    Minutes of Exercise per Session: 60 min  Stress: No Stress Concern Present (07/04/2023)   [employer] of Occupational Health - Occupational Stress Questionnaire    Feeling of Stress : Not at all  Social Connections: Moderately Isolated (07/04/2023)   Social Connection and Isolation Panel [NHANES]    Frequency of Communication with Friends and Family: Three times a week    Frequency of Social Gatherings with Friends and Family: More than three times a week    Attends Religious Services: Never    Database administrator or Organizations: No    Attends Engineer, structural: Not on file    Marital Status: Married   Past Surgical History:  Procedure Laterality Date   CATARACT EXTRACTION Right    COLONOSCOPY  2018   CYSTOSCOPY/URETEROSCOPY/HOLMIUM LASER/STENT PLACEMENT Left 10/05/2019   Procedure: CYSTOSCOPY/RETROGRADE/URETEROSCOPY/HOLMIUM LASER/STENT PLACEMENT;  Surgeon: Ihor Gully, MD;  Location: [facility];  Service: Urology;  Laterality: Left;   EXTRACORPOREAL SHOCK WAVE LITHOTRIPSY Left 01/25/2022   Procedure: EXTRACORPOREAL SHOCK WAVE LITHOTRIPSY (ESWL);  Surgeon: Heloise Purpura, MD;  Location: [facility];  Service: Urology;  Laterality: Left;   EYE SURGERY Left 12/06/2017   cataract by Dr Dyanne Carrel HERNIA REPAIR Bilateral 06/01/2019   Procedure: LAPAROSCOPIC LEFT AND  RIGHT INGUINAL HERNIA REPAIR WITH MESH;  Surgeon:  Karie Soda, MD;  Location: [facility];  Service: General;  Laterality: Bilateral;   LEFT HEART CATHETERIZATION WITH CORONARY ANGIOGRAM N/A 04/10/2015   Procedure: LEFT HEART CATHETERIZATION WITH  CORONARY ANGIOGRAM;  Surgeon: Marykay Lex, MD;  Location: [facility] CATH LAB;  Service: Cardiovascular;  Angiographicallly NORMAL CORONARY ARTERIES   MASS EXCISION N/A 05/16/2014   Procedure: EXCISION POSTERIOR NECK MASS, RIGHT CHEST  WALL MASS AND RIGHT AXILLARY MASS AXILLARY NODE DISSECTION;  Surgeon: Ardeth Sportsman, MD;  Location: [facility];  Service: General;  Laterality: N/A;   melanoma removal     MOUTH SURGERY N/A    NM MYOVIEW LTD  03/19/2015   FALSE POSITIVE:  INTERMEDIATE RISK. Small sized, moderate intensity inferior ischemic perfusion defect   REVERSE SHOULDER ARTHROPLASTY Left 10/16/2020   Procedure: REVERSE SHOULDER ARTHROPLASTY;  Surgeon: Francena Hanly, MD;  Location: [facility];  Service: Orthopedics;  Laterality: Left;    TRANSTHORACIC ECHOCARDIOGRAM  03/20/2015   Normal LV size and function. EF 60-65%. G1 DD. Trivial AI. Borderline aortic root dilation (41 mm), Mild LA dilation.   VEIN LIGATION AND STRIPPING Left 07/13/2021   Procedure: LIGATION AND STRIPPING OF LEFT GREAT SAPHENOUS VEIN WITH STAB PHLEBECTOMY GREATER THAN 20 INCISIONS TO LEFT LEG;  Surgeon: Chuck Hint, MD;  Location: [facility];  Service: Vascular;  Laterality: Left;   Past Medical History:  Diagnosis Date   Atrial fibrillation (HCC)    BPH (benign prostatic hypertrophy)    Bradycardia 12/30/2014   HR routinely in mid 40s-50s   Coronary artery disease (CAD) excluded 03/2015   False-positive nuclear stress test suggesting inferior ischemia   Dysrhythmia    Elevated PSA    Encounter for Medicare annual wellness exam 09/22/2013   Sees Dr Karlyn Agee of Derm Sees Dr Stan Head of Gastroenterology Sees Dr Ihor Gully of [facility] Sees Dr Normand Sloop of Optometry   GERD  (gastroesophageal reflux disease)    controlled w/ diet and behavioral changes, history of   Grade I diastolic dysfunction 2016   History of kidney stones    Hypertriglyceridemia 03/16/2015   Kidney stones    Lymphadenitis    Melanoma of back (HCC)    Excised Dr Karlyn Agee   Mild ascending aorta dilatation (HCC)    New onset a-fib (HCC) 03/10/2015   OA (osteoarthritis)    Otitis, externa, infective 08/10/2015   Palpitations    Pneumonia 2004   Post herpetic neuralgia    Shingles 07/09/2013   Sleep apnea 07/30/2011   cpap- 13    Snoring disorder 07/30/2011   BP 117/86   Pulse 72   Ht 5\' 6"  (1.676 m)   Wt 221 lb (100.2 kg)   SpO2 97%   BMI 35.67 kg/m   Opioid Risk Score:   Fall Risk Score:  `1  Depression screen [facility] 2/9     08/31/2023    1:46 PM 07/11/2023   10:13 AM 08/10/2022   10:31 AM 08/09/2022   10:17 AM 08/05/2021   11:06 AM 08/04/2020   11:42 AM 08/18/2017    9:29 AM  Depression screen PHQ 2/9  Decreased Interest 0 0 0 0 0 0 1  Down, Depressed, Hopeless 0 0 0 0 0 0 0  PHQ - 2 Score 0 0 0 0 0 0 1  Altered sleeping 0  0    3  Tired, decreased energy 0  0    2  Change in appetite 0  0    0  Feeling bad or failure about yourself  0  0    1  Trouble concentrating 0  0    0  Moving slowly or fidgety/restless 0  0    0  Suicidal thoughts 0  0    0  PHQ-9 Score 0  0    7  Difficult doing work/chores Not difficult at all  Not difficult  at all    Not difficult at all      Review of Systems  Musculoskeletal:  Positive for back pain.  All other systems reviewed and are negative.     Objective:   Physical Exam   General: Alert and oriented x 3, No apparent distress HEENT: Head is normocephalic, atraumatic, PERRLA, EOMI, sclera anicteric, oral mucosa pink and moist, dentition intact, ext ear canals clear,  Neck: Supple without JVD or lymphadenopathy Heart: Reg rate and rhythm. No murmurs rubs or gallops Chest: CTA bilaterally without wheezes, rales, or rhonchi;  no distress Abdomen: Soft, non-tender, non-distended, bowel sounds positive. Extremities: No clubbing, cyanosis, RUE in sleeve, trace edema Psych: Pt's affect is appropriate. Pt is cooperative Skin: Clean and intact without signs of breakdown Neuro:  Alert and oriented x 3. Normal insight and awareness. Intact Memory. Normal language and speech. Cranial nerve exam unremarkable. MMT: 5/5. Marland Kitchen   Musculoskeletal: right axilla and infrscapular area tender to palpation with anterior extension to outer right pec. About 3-4" in thickness       Assessment & Plan:  Chronic post-herpetic neuralgia along the right T3, T4 dermatome.  On chronic gabapentin 2. Chronic lymphedema RUE   Plan: Qutenza patch to right axilla Continue same gabapentin. Can potentially wean depending upon his response to patch   Twenty minutes of face to face patient care time were spent during this visit. All questions were encouraged and answered.  Follow up with me in a month .

## 2023-08-31 NOTE — Patient Instructions (Signed)
ALWAYS FEEL FREE TO CALL OUR OFFICE WITH ANY PROBLEMS OR QUESTIONS ([phone])  **PLEASE NOTE** ALL MEDICATION REFILL REQUESTS (INCLUDING CONTROLLED SUBSTANCES) NEED TO BE MADE AT LEAST 7 DAYS PRIOR TO REFILL BEING DUE. ANY REFILL REQUESTS INSIDE THAT TIME FRAME MAY RESULT IN DELAYS IN RECEIVING YOUR PRESCRIPTION.                    

## 2023-08-31 NOTE — Addendum Note (Signed)
Addended by: Faith Rogue T on: 08/31/2023 02:29 PM   Modules accepted: Level of Service

## 2023-08-31 NOTE — Progress Notes (Unsigned)
Chief Complaint:   OBESITY [name] [name] is here to discuss his progress with his obesity treatment plan along with follow-up of his obesity related diagnoses. [name] [name] is on the Category 3 Plan and states he is following his eating plan approximately 75% of the time. [name] [name] states he is active while doing yard work and work shop for 8-9 hours 7 times per week.    Today's visit was #: 54 Starting weight: 237 lbs Starting date: 08/18/2017 Today's weight: 213 lbs Today's date: 08/31/2023 Total lbs lost to date: 24 Total lbs lost since last in-office visit: 0  Interim History: Patient did some celebration eating over Labor Day.  Otherwise he has been mindful of his food choices and he is active daily.  He is open to changing meal plans and he already does some journaling.  Subjective:   1. Vitamin D deficiency Patient is on OTC vitamin D with no nausea, vomiting, or muscle weakness.  He also gets a fair amount of sun most days.  His last vitamin D level was close to goal.  Assessment/Plan:   1. Vitamin D deficiency Patient will continue OTC vitamin D 50,000 IU daily, and we will recheck labs at his next visit.  2. BMI 34.0-34.9,adult  3. Obesity with starting BMI of 35.0 [name] [name] is currently in the action stage of change. As such, his goal is to continue with weight loss efforts. He has agreed to change to keeping a food journal and adhering to recommended goals of 1500-1800 calories and 100+ grams of protein daily.   Patient was shown how to journal and how to use chat GPT to create recipes and help with meal planning.  Exercise goals: As is.   Behavioral modification strategies: increasing lean protein intake and meal planning and cooking strategies.  [name] [name] has agreed to follow-up with our clinic in 6 to 8 weeks. He was informed of the importance of frequent follow-up visits to maximize his success with intensive lifestyle modifications for his multiple health conditions.   Objective:    Blood pressure 118/72, pulse 74, temperature (!) 97.5 F (36.4 C), height 5\' 6"  (1.676 m), weight 213 lb (96.6 kg), SpO2 100%. Body mass index is 34.38 kg/m.  Lab Results  Component Value Date   CREATININE 0.87 05/25/2023   BUN 24 05/25/2023   NA 140 05/25/2023   K 4.5 05/25/2023   CL 103 05/25/2023   CO2 21 05/25/2023   Lab Results  Component Value Date   ALT 18 05/25/2023   AST 19 05/25/2023   ALKPHOS 64 05/25/2023   BILITOT 0.8 05/25/2023   Lab Results  Component Value Date   HGBA1C 6.1 (H) 05/25/2023   HGBA1C 5.8 (H) 01/18/2023   HGBA1C 5.7 (H) 08/10/2022   HGBA1C 6.0 (H) 11/30/2021   HGBA1C 5.6 06/22/2021   Lab Results  Component Value Date   INSULIN 5.3 05/25/2023   INSULIN 6.3 01/18/2023   INSULIN 6.6 11/30/2021   INSULIN 6.2 06/22/2021   INSULIN 5.1 12/03/2020   Lab Results  Component Value Date   TSH 2.040 08/10/2022   Lab Results  Component Value Date   CHOL 152 05/25/2023   HDL 61 05/25/2023   LDLCALC 78 05/25/2023   TRIG 62 05/25/2023   CHOLHDL 2.5 08/10/2022   Lab Results  Component Value Date   VD25OH 60.5 05/25/2023   VD25OH 69.8 01/18/2023   VD25OH 73.1 08/10/2022   Lab Results  Component Value Date   WBC 5.3 09/13/2022  HGB 15.7 09/13/2022   HCT 46.7 09/13/2022   MCV 97 09/13/2022   PLT 177 09/13/2022   No results found for: "IRON", "TIBC", "FERRITIN"  Attestation Statements:   Reviewed by clinician on day of visit: allergies, medications, problem list, medical history, surgical history, family history, social history, and previous encounter notes.  Time spent on visit including pre-visit chart review and post-visit care and charting was 33 minutes.   I, Burt Knack, am acting as transcriptionist for Quillian Quince, MD.  I have reviewed the above documentation for accuracy and completeness, and I agree with the above. -  Quillian Quince, MD

## 2023-09-07 ENCOUNTER — Telehealth: Payer: Self-pay

## 2023-09-07 DIAGNOSIS — B0229 Other postherpetic nervous system involvement: Secondary | ICD-10-CM

## 2023-09-07 MED ORDER — QUTENZA (4 PATCH) 8 % EX KIT: 4.0000 | PACK | Freq: Once | CUTANEOUS | 0 refills | Status: AC

## 2023-09-07 NOTE — Telephone Encounter (Signed)
Submitted Cyndi Lennert (Key: Lonell Grandchild)

## 2023-09-07 NOTE — Telephone Encounter (Signed)
Prescription sent to [facility]

## 2023-09-30 ENCOUNTER — Other Ambulatory Visit (HOSPITAL_BASED_OUTPATIENT_CLINIC_OR_DEPARTMENT_OTHER): Payer: Self-pay

## 2023-09-30 MED ORDER — FLUAD 0.5 ML IM SUSY
PREFILLED_SYRINGE | INTRAMUSCULAR | 0 refills | Status: DC
  Filled 2023-09-30: qty 0.5, 1d supply, fill #0

## 2023-10-12 ENCOUNTER — Ambulatory Visit: Payer: Medicare Other | Admitting: Physical Medicine & Rehabilitation

## 2023-10-18 ENCOUNTER — Ambulatory Visit (INDEPENDENT_AMBULATORY_CARE_PROVIDER_SITE_OTHER): Payer: Medicare Other | Admitting: Family Medicine

## 2023-10-18 ENCOUNTER — Encounter (INDEPENDENT_AMBULATORY_CARE_PROVIDER_SITE_OTHER): Payer: Self-pay | Admitting: Family Medicine

## 2023-10-18 VITALS — BP 107/72 | HR 70 | Temp 97.7°F | Ht 66.0 in | Wt 208.0 lb

## 2023-10-18 DIAGNOSIS — E669 Obesity, unspecified: Secondary | ICD-10-CM | POA: Diagnosis not present

## 2023-10-18 DIAGNOSIS — E559 Vitamin D deficiency, unspecified: Secondary | ICD-10-CM

## 2023-10-18 DIAGNOSIS — Z6833 Body mass index (BMI) 33.0-33.9, adult: Secondary | ICD-10-CM | POA: Diagnosis not present

## 2023-10-18 NOTE — Progress Notes (Signed)
.sm[facility]  Office: [phone]  /  Fax: [phone]  WEIGHT SUMMARY AND BIOMETRICS  Anthropometric Measurements Height: 5\' 6"  (1.676 m) Weight: 208 lb (94.3 kg) BMI (Calculated): 33.59 Weight at Last Visit: 213 lb Weight Lost Since Last Visit: 5 lb Weight Gained Since Last Visit: 0 Starting Weight: 237 lb Total Weight Loss (lbs): 29 lb (13.2 kg) Peak Weight: 270 lb   Body Composition  Body Fat %: 35 % Fat Mass (lbs): 73 lbs Muscle Mass (lbs): 128.8 lbs Total Body Water (lbs): 98.8 lbs Visceral Fat Rating : 23   Other Clinical Data Fasting: No Labs: No Today's Visit #: 55 Starting Date: 08/18/18    Chief Complaint: OBESITY  History of Present Illness   The patient, with a history of obesity and vitamin D deficiency, has been adhering to a category three eating plan approximately 85% of the time. Over the past six weeks, he has lost five pounds. He maintains a physically active lifestyle, engaging in activities such as woodworking, cabinet making, and yard work. He is currently taking over-the-counter vitamin D supplements at a dose of 5000 international units per day.  The patient reports feeling good overall, with no significant hunger issues. He has been considering incorporating more homemade soups into his diet, with a focus on increasing protein content. He expresses interest in modifying his minestrone recipe, which is currently vegetable-based, to include chicken for added protein. He also enjoys split pea soup with ham and is open to increasing the ham content for additional protein.  The patient is planning a cruise trip and is seeking advice on maintaining his dietary plan during the trip. He typically avoids buffet meals on cruises, preferring to dine in the ship's dining room. He does not plan to purchase a drink package on the cruise, limiting his beverage intake to coffee and occasional brandy. He is also aware of the need for physical activity during the cruise,  including taking the stairs for three floors or less.  The patient's last full workup was in May, and he is due for a follow-up before the holiday season to check protein levels and kidney function. He is also considering incorporating chair yoga exercises into his routine for additional muscle strengthening and stretching.          PHYSICAL EXAM:  Blood pressure 107/72, pulse 70, temperature 97.7 F (36.5 C), height 5\' 6"  (1.676 m), weight 208 lb (94.3 kg), SpO2 98%. Body mass index is 33.57 kg/m.  DIAGNOSTIC DATA REVIEWED:  BMET    Component Value Date/Time   NA 140 05/25/2023 0921   K 4.5 05/25/2023 0921   CL 103 05/25/2023 0921   CO2 21 05/25/2023 0921   GLUCOSE 84 05/25/2023 0921   GLUCOSE 95 12/29/2021 1432   BUN 24 05/25/2023 0921   CREATININE 0.87 05/25/2023 0921   CREATININE 0.92 09/11/2020 0926   CALCIUM 9.1 05/25/2023 0921   GFRNONAA >60 12/29/2021 1432   GFRAA 82 12/03/2020 0930   Lab Results  Component Value Date   HGBA1C 6.1 (H) 05/25/2023   HGBA1C 5.8 (H) 08/18/2017   Lab Results  Component Value Date   INSULIN 5.3 05/25/2023   INSULIN 17.6 08/18/2017   Lab Results  Component Value Date   TSH 2.040 08/10/2022   CBC    Component Value Date/Time   WBC 5.3 09/13/2022 0925   WBC 10.6 (H) 12/29/2021 1432   RBC 4.82 09/13/2022 0925   RBC 4.70 12/29/2021 1432   HGB 15.7 09/13/2022 0925  HCT 46.7 09/13/2022 0925   PLT 177 09/13/2022 0925   MCV 97 09/13/2022 0925   MCH 32.6 09/13/2022 0925   MCH 33.0 12/29/2021 1432   MCHC 33.6 09/13/2022 0925   MCHC 35.1 12/29/2021 1432   RDW 13.0 09/13/2022 0925   Iron Studies No results found for: "IRON", "TIBC", "FERRITIN", "IRONPCTSAT" Lipid Panel     Component Value Date/Time   CHOL 152 05/25/2023 0921   TRIG 62 05/25/2023 0921   HDL 61 05/25/2023 0921   CHOLHDL 2.5 08/10/2022 1206   CHOLHDL 3 10/11/2019 1139   VLDL 17.4 10/11/2019 1139   LDLCALC 78 05/25/2023 0921   Hepatic Function Panel      Component Value Date/Time   PROT 6.6 05/25/2023 0921   ALBUMIN 4.1 05/25/2023 0921   AST 19 05/25/2023 0921   ALT 18 05/25/2023 0921   ALKPHOS 64 05/25/2023 0921   BILITOT 0.8 05/25/2023 0921   BILIDIR 0.1 02/11/2014 1016   IBILI 0.3 02/11/2014 1016      Component Value Date/Time   TSH 2.040 08/10/2022 1206   Nutritional Lab Results  Component Value Date   VD25OH 60.5 05/25/2023   VD25OH 69.8 01/18/2023   VD25OH 73.1 08/10/2022     Assessment and Plan    Obesity Noted weight loss of 5 pounds in the last 6 weeks. Adherence to category three eating plan 85% of the time. Discussed strategies for increasing protein intake and reducing sodium in homemade soups. Discussed the importance of consistent exercise beyond work-related activities. -Continue category three eating plan. -Consider chair yoga exercises for additional physical activity. -Plan to review progress and strategies before Christmas holidays.  Vitamin D deficiency Currently on Vitamin D 5000 international units per day. -Continue Vitamin D 5000 international units daily.  General Health Maintenance Discussed the importance of hydration and sun protection during upcoming cruise. -Stay well-hydrated during cruise. -Use sunscreen and wear protective clothing during cruise. -Plan for fasting blood work at next visit before Christmas holidays.        He was informed of the importance of frequent follow up visits to maximize his success with intensive lifestyle modifications for his multiple health conditions.    Quillian Quince, MD

## 2023-11-08 ENCOUNTER — Ambulatory Visit
Admission: RE | Admit: 2023-11-08 | Discharge: 2023-11-08 | Disposition: A | Discharge status: Home / Self Care | Payer: Medicare Other | Source: Ambulatory Visit | Attending: Internal Medicine | Admitting: Internal Medicine

## 2023-11-08 ENCOUNTER — Ambulatory Visit (INDEPENDENT_AMBULATORY_CARE_PROVIDER_SITE_OTHER): Payer: Medicare Other

## 2023-11-08 VITALS — BP 157/93 | HR 96 | Temp 98.9°F | Resp 15

## 2023-11-08 DIAGNOSIS — R051 Acute cough: Secondary | ICD-10-CM | POA: Diagnosis not present

## 2023-11-08 DIAGNOSIS — Z96612 Presence of left artificial shoulder joint: Secondary | ICD-10-CM | POA: Diagnosis not present

## 2023-11-08 DIAGNOSIS — R918 Other nonspecific abnormal finding of lung field: Secondary | ICD-10-CM | POA: Diagnosis not present

## 2023-11-08 DIAGNOSIS — J209 Acute bronchitis, unspecified: Secondary | ICD-10-CM | POA: Diagnosis not present

## 2023-11-08 DIAGNOSIS — R509 Fever, unspecified: Secondary | ICD-10-CM | POA: Diagnosis not present

## 2023-11-08 DIAGNOSIS — R059 Cough, unspecified: Secondary | ICD-10-CM | POA: Diagnosis not present

## 2023-11-08 LAB — POC COVID19/FLU A&B COMBO
Covid Antigen, POC: NEGATIVE
Influenza A Antigen, POC: NEGATIVE
Influenza B Antigen, POC: NEGATIVE

## 2023-11-08 MED ORDER — AZITHROMYCIN 250 MG PO TABS
250.0000 mg | ORAL_TABLET | Freq: Every day | ORAL | 0 refills | Status: DC
Start: 2023-11-08 — End: 2023-12-15

## 2023-11-08 MED ORDER — BENZONATATE 200 MG PO CAPS
200.0000 mg | ORAL_CAPSULE | Freq: Three times a day (TID) | ORAL | 0 refills | Status: DC | PRN
Start: 2023-11-08 — End: 2024-01-23

## 2023-11-08 NOTE — Discharge Instructions (Signed)
Start Zithromax as prescribed.  You may take Tessalon as needed for cough.  Lots of rest and fluids.  Please follow-up with your PCP in 2 to 3 days for recheck.  Please go to the ER for any worsening symptoms.  I hope you feel better soon!

## 2023-11-08 NOTE — ED Triage Notes (Signed)
Pt presents with c/o a productive cough X 2 days. Pt had a fever of 101.4 and no energy on Sunday.   Pt shows concerns for pneumonia and bronchitis.

## 2023-11-08 NOTE — ED Provider Notes (Signed)
[facility]    CSN: [id number] Arrival date & time: 11/08/23  1629      History   Chief Complaint Chief Complaint  Patient presents with   Cough    Just got off cruise ship Friday.  Have been sick since.  Possibly bronchitis.  Want to be sure it's not pneumonia. - Entered by patient   Appointment    HPI [name] [name] is a 82 y.o. male  presents for evaluation of URI symptoms for 2 days. Patient reports associated symptoms of cough, fever of 101.4, congestion, diarrhea. Denies N/V, ear pain, sore throat, body aches, shortness of breath. Patient does not have a hx of asthma. Patient does have a previous history of smoking.  Recently returned from a cruise where they states multiple people coughing and thinks he may have been exposed to pneumonia.  Pt has taken Mucinex OTC for symptoms. Pt has no other concerns at this time.    Cough Associated symptoms: fever     Past Medical History:  Diagnosis Date   Atrial fibrillation (HCC)    BPH (benign prostatic hypertrophy)    Bradycardia 12/30/2014   HR routinely in mid 40s-50s   Coronary artery disease (CAD) excluded 03/2015   False-positive nuclear stress test suggesting inferior ischemia   Dysrhythmia    Elevated PSA    Encounter for Medicare annual wellness exam 09/22/2013   Sees Dr Karlyn Agee of Derm Sees Dr Stan Head of Gastroenterology Sees Dr Ihor Gully of [facility] Sees Dr Normand Sloop of Optometry   GERD (gastroesophageal reflux disease)    controlled w/ diet and behavioral changes, history of   Grade I diastolic dysfunction 2016   History of kidney stones    Hypertriglyceridemia 03/16/2015   Kidney stones    Lymphadenitis    Melanoma of back (HCC)    Excised Dr Karlyn Agee   Mild ascending aorta dilatation (HCC)    New onset a-fib (HCC) 03/10/2015   OA (osteoarthritis)    Otitis, externa, infective 08/10/2015   Palpitations    Pneumonia 2004   Post herpetic neuralgia    Shingles  07/09/2013   Sleep apnea 07/30/2011   cpap- 13    Snoring disorder 07/30/2011    Patient Active Problem List   Diagnosis Date Noted   Central adiposity 07/06/2023   BMI 33.0-33.9,adult 01/18/2023   Obesity, Beginning BMI 35.0 01/18/2023   Dehydration 09/13/2022   Prediabetes 09/13/2022   Elevated MCV 09/13/2022   Hypercoagulable state due to permanent atrial fibrillation (HCC) 02/09/2022   Kidney stone 01/28/2022   S/P reverse total shoulder arthroplasty, left 10/24/2020   Preop cardiovascular exam 10/10/2020   Bilateral hearing loss 09/11/2020   Arthritis of left glenohumeral joint 06/25/2020   Muscle cramps 10/14/2019   Hearing loss of right ear 10/14/2019   Tinnitus of right ear 10/14/2019   Bilateral inguinal herniae (BIH) s/p lap repair w mesh 06/01/2019 06/01/2019   Right Femoral hernia s/p lap repair w mesh 06/01/2019 06/01/2019   Groin pain, left 12/11/2018   Class 2 severe obesity with serious comorbidity and body mass index (BMI) of 35.0 to 35.9 in adult [facility]) 08/18/2017   Other fatigue 08/18/2017   Vitamin D deficiency 08/18/2017   Hyperglycemia 08/18/2017   Lymphedema 09/21/2015   Preventative health care 09/21/2015   Abnormal exercise myocardial perfusion study 04/02/2015   Permanent atrial fibrillation (HCC) 03/16/2015   Hypertriglyceridemia 03/16/2015   Bradycardia 12/30/2014   Melanoma of back (HCC)  Mass of chest wall, right subfacial, 12cm s/p removal w rib resection 05/16/2014 03/08/2014   Mass of posterior neck, SQ 4cm 03/08/2014   Chest wall mass - right SQ anterior 5cm 08/21/2013   Post herpetic neuralgia 07/09/2013   OSA on CPAP 07/30/2011   ELEVATED PROSTATE SPECIFIC ANTIGEN 01/13/2010   LIPOMA, SKIN 08/29/2008   GERD 08/29/2008   BENIGN PROSTATIC HYPERTROPHY 08/29/2008    Past Surgical History:  Procedure Laterality Date   CATARACT EXTRACTION Right    COLONOSCOPY  2018   CYSTOSCOPY/URETEROSCOPY/HOLMIUM LASER/STENT PLACEMENT Left 10/05/2019    Procedure: CYSTOSCOPY/RETROGRADE/URETEROSCOPY/HOLMIUM LASER/STENT PLACEMENT;  Surgeon: Ihor Gully, MD;  Location: [facility];  Service: Urology;  Laterality: Left;   EXTRACORPOREAL SHOCK WAVE LITHOTRIPSY Left 01/25/2022   Procedure: EXTRACORPOREAL SHOCK WAVE LITHOTRIPSY (ESWL);  Surgeon: Heloise Purpura, MD;  Location: [facility];  Service: Urology;  Laterality: Left;   EYE SURGERY Left 12/06/2017   cataract by Dr Dyanne Carrel HERNIA REPAIR Bilateral 06/01/2019   Procedure: LAPAROSCOPIC LEFT AND  RIGHT INGUINAL HERNIA REPAIR WITH MESH;  Surgeon: Karie Soda, MD;  Location: [facility];  Service: General;  Laterality: Bilateral;   LEFT HEART CATHETERIZATION WITH CORONARY ANGIOGRAM N/A 04/10/2015   Procedure: LEFT HEART CATHETERIZATION WITH  CORONARY ANGIOGRAM;  Surgeon: Marykay Lex, MD;  Location: [facility] CATH LAB;  Service: Cardiovascular;  Angiographicallly NORMAL CORONARY ARTERIES   MASS EXCISION N/A 05/16/2014   Procedure: EXCISION POSTERIOR NECK MASS, RIGHT CHEST WALL MASS AND RIGHT AXILLARY MASS AXILLARY NODE DISSECTION;  Surgeon: Ardeth Sportsman, MD;  Location: [facility];  Service: General;  Laterality: N/A;   melanoma removal     MOUTH SURGERY N/A    NM MYOVIEW LTD  03/19/2015   FALSE POSITIVE:  INTERMEDIATE RISK. Small sized, moderate intensity inferior ischemic perfusion defect   REVERSE SHOULDER ARTHROPLASTY Left 10/16/2020   Procedure: REVERSE SHOULDER ARTHROPLASTY;  Surgeon: Francena Hanly, MD;  Location: [facility];  Service: Orthopedics;  Laterality: Left;    TRANSTHORACIC ECHOCARDIOGRAM  03/20/2015   Normal LV size and function. EF 60-65%. G1 DD. Trivial AI. Borderline aortic root dilation (41 mm), Mild LA dilation.   VEIN LIGATION AND STRIPPING Left 07/13/2021   Procedure: LIGATION AND STRIPPING OF LEFT GREAT SAPHENOUS VEIN WITH STAB PHLEBECTOMY GREATER THAN 20 INCISIONS TO LEFT LEG;  Surgeon: Chuck Hint, MD;   Location: [facility] OR;  Service: Vascular;  Laterality: Left;       Home Medications    Prior to Admission medications   Medication Sig Start Date End Date Taking? Authorizing Provider  azithromycin (ZITHROMAX) 250 MG tablet Take 1 tablet (250 mg total) by mouth daily. Take first 2 tablets together, then 1 every day until finished. 11/08/23  Yes Radford Pax, NP  benzonatate (TESSALON) 200 MG capsule Take 1 capsule (200 mg total) by mouth 3 (three) times daily as needed. 11/08/23  Yes Radford Pax, NP  Cholecalciferol (VITAMIN D3) 125 MCG (5000 UT) CAPS Take 1 capsule (5,000 Units total) by mouth daily. Patient taking differently: Take 5,000 Units by mouth daily. 08/27/20   Quillian Quince D, MD  ELIQUIS 5 MG TABS tablet Take 1 tablet by mouth twice daily 05/27/23   Marykay Lex, MD  gabapentin (NEURONTIN) 300 MG capsule Take 3 capsules (900 mg total) by mouth 3 (three) times daily. 07/11/23   Bradd Canary, MD  influenza vaccine adjuvanted (FLUAD QUADRIVALENT) 0.5 ML injection Inject into the muscle. 10/07/22  Judyann Munson, MD  influenza vaccine adjuvanted (FLUAD) 0.5 ML injection Inject into the muscle. 09/30/23   Judyann Munson, MD  Multiple Vitamins-Minerals (MULTIVITAMIN MEN 50+) TABS Take by mouth daily.    [provider]  OVER THE COUNTER MEDICATION Apply 1 application topically daily as needed (Pain). Hemp freeze    [provider]  sildenafil (VIAGRA) 100 MG tablet Take 100 mg by mouth daily as needed for erectile dysfunction. 04/23/21   [provider]  tamsulosin (FLOMAX) 0.4 MG CAPS capsule Take 1 capsule (0.4 mg total) by mouth at bedtime. 01/25/22   Heloise Purpura, MD    Family History Family History  Problem Relation Age of Onset   Cancer Mother 31       MM, leukemia   Obesity Mother    Emphysema Father 67   Obesity Father    Alcohol abuse Father    Cancer Sister 43       brain   Diabetes Sister 79   Obesity Sister    COPD Brother 71    Down syndrome Brother 23       aspirated   Diabetes Son    Pancreatitis Son    Heart disease Paternal Grandmother    COPD Paternal Grandfather    Alcohol abuse Other    Colon cancer Neg Hx     Social History Social History   Tobacco Use   Smoking status: Former    Types: Pipe, Cigars    Quit date: 02/11/1989    Years since quitting: 34.7   Smokeless tobacco: Never  Vaping Use   Vaping status: Never Used  Substance Use Topics   Alcohol use: Yes    Alcohol/week: 7.0 standard drinks of alcohol    Types: 7 Glasses of wine per week    Comment: 1 per day -- brandy or wine   Drug use: No     Allergies   Patient has no known allergies.   Review of Systems Review of Systems  Constitutional:  Positive for fever.  HENT:  Positive for congestion.   Respiratory:  Positive for cough.   Gastrointestinal:  Positive for diarrhea.     Physical Exam Triage Vital Signs ED Triage Vitals  Encounter Vitals Group     BP 11/08/23 1652 (!) 152/100     Systolic BP Percentile --      Diastolic BP Percentile --      Pulse Rate 11/08/23 1652 96     Resp 11/08/23 1652 15     Temp 11/08/23 1652 98.9 F (37.2 C)     Temp Source 11/08/23 1652 Oral     SpO2 11/08/23 1652 95 %     Weight --      Height --      Head Circumference --      Peak Flow --      Pain Score 11/08/23 1651 4     Pain Loc --      Pain Education --      Exclude from Growth Chart --    No data found.  Updated Vital Signs BP (!) 157/93   Pulse 96   Temp 98.9 F (37.2 C) (Oral)   Resp 15   SpO2 95%   Visual Acuity Right Eye Distance:   Left Eye Distance:   Bilateral Distance:    Right Eye Near:   Left Eye Near:    Bilateral Near:     Physical Exam Vitals and nursing note reviewed.  Constitutional:  General: He is not in acute distress.    Appearance: Normal appearance. He is not ill-appearing or toxic-appearing.  HENT:     Head: Normocephalic and atraumatic.     Right Ear: Tympanic membrane  and ear canal normal.     Left Ear: Tympanic membrane and ear canal normal.     Nose: Congestion present.     Mouth/Throat:     Mouth: Mucous membranes are moist.     Pharynx: No oropharyngeal exudate or posterior oropharyngeal erythema.  Eyes:     Pupils: Pupils are equal, round, and reactive to light.  Cardiovascular:     Rate and Rhythm: Normal rate and regular rhythm.     Heart sounds: Normal heart sounds.  Pulmonary:     Effort: Pulmonary effort is normal.     Breath sounds: Normal breath sounds.  Musculoskeletal:     Cervical back: Normal range of motion and neck supple.  Lymphadenopathy:     Cervical: No cervical adenopathy.  Skin:    General: Skin is warm and dry.  Neurological:     General: No focal deficit present.     Mental Status: He is alert and oriented to person, place, and time.  Psychiatric:        Mood and Affect: Mood normal.        Behavior: Behavior normal.      UC Treatments / Results  Labs (all labs ordered are listed, but only abnormal results are displayed) Labs Reviewed  POC COVID19/FLU A&B COMBO    EKG   Radiology No results found.  Procedures Procedures (including critical care time)  Medications Ordered in UC Medications - No data to display  Initial Impression / Assessment and Plan / UC Course  I have reviewed the triage vital signs and the nursing notes.  Pertinent labs & imaging results that were available during my care of the patient were reviewed by me and considered in my medical decision making (see chart for details).     Reviewed exam and symptoms with patient.  No red flags.  Wet read of x-ray without obvious consolidation.  Given reported fever,worsening cough and having exposure to pneumonia will start azithromycin.  Tessalon as needed for cough.  PCP follow-up 2 to 3 days for recheck.  ER precautions reviewed. Final Clinical Impressions(s) / UC Diagnoses   Final diagnoses:  Acute cough  Acute bronchitis,  unspecified organism     Discharge Instructions      Start Zithromax as prescribed.  You may take Tessalon as needed for cough.  Lots of rest and fluids.  Please follow-up with your PCP in 2 to 3 days for recheck.  Please go to the ER for any worsening symptoms.  I hope you feel better soon!     ED Prescriptions     Medication Sig Dispense Auth. Provider   azithromycin (ZITHROMAX) 250 MG tablet Take 1 tablet (250 mg total) by mouth daily. Take first 2 tablets together, then 1 every day until finished. 6 tablet Radford Pax, NP   benzonatate (TESSALON) 200 MG capsule Take 1 capsule (200 mg total) by mouth 3 (three) times daily as needed. 20 capsule Radford Pax, NP      PDMP not reviewed this encounter.   Radford Pax, NP 11/08/23 1807

## 2023-11-16 ENCOUNTER — Encounter: Payer: Self-pay | Admitting: Internal Medicine

## 2023-11-18 DIAGNOSIS — H11001 Unspecified pterygium of right eye: Secondary | ICD-10-CM | POA: Diagnosis not present

## 2023-11-18 DIAGNOSIS — H26491 Other secondary cataract, right eye: Secondary | ICD-10-CM | POA: Diagnosis not present

## 2023-11-18 DIAGNOSIS — Z961 Presence of intraocular lens: Secondary | ICD-10-CM | POA: Diagnosis not present

## 2023-12-07 DIAGNOSIS — D1801 Hemangioma of skin and subcutaneous tissue: Secondary | ICD-10-CM | POA: Diagnosis not present

## 2023-12-07 DIAGNOSIS — L814 Other melanin hyperpigmentation: Secondary | ICD-10-CM | POA: Diagnosis not present

## 2023-12-07 DIAGNOSIS — L821 Other seborrheic keratosis: Secondary | ICD-10-CM | POA: Diagnosis not present

## 2023-12-07 DIAGNOSIS — D2372 Other benign neoplasm of skin of left lower limb, including hip: Secondary | ICD-10-CM | POA: Diagnosis not present

## 2023-12-07 DIAGNOSIS — D485 Neoplasm of uncertain behavior of skin: Secondary | ICD-10-CM | POA: Diagnosis not present

## 2023-12-07 DIAGNOSIS — Z85820 Personal history of malignant melanoma of skin: Secondary | ICD-10-CM | POA: Diagnosis not present

## 2023-12-07 DIAGNOSIS — L989 Disorder of the skin and subcutaneous tissue, unspecified: Secondary | ICD-10-CM | POA: Diagnosis not present

## 2023-12-07 DIAGNOSIS — L905 Scar conditions and fibrosis of skin: Secondary | ICD-10-CM | POA: Diagnosis not present

## 2023-12-07 DIAGNOSIS — D225 Melanocytic nevi of trunk: Secondary | ICD-10-CM | POA: Diagnosis not present

## 2023-12-15 ENCOUNTER — Ambulatory Visit (INDEPENDENT_AMBULATORY_CARE_PROVIDER_SITE_OTHER): Payer: Medicare Other | Admitting: Family Medicine

## 2023-12-15 ENCOUNTER — Encounter (INDEPENDENT_AMBULATORY_CARE_PROVIDER_SITE_OTHER): Payer: Self-pay | Admitting: Family Medicine

## 2023-12-15 VITALS — BP 121/76 | HR 66 | Temp 97.4°F | Ht 66.0 in | Wt 216.0 lb

## 2023-12-15 DIAGNOSIS — E559 Vitamin D deficiency, unspecified: Secondary | ICD-10-CM | POA: Diagnosis not present

## 2023-12-15 DIAGNOSIS — E669 Obesity, unspecified: Secondary | ICD-10-CM | POA: Diagnosis not present

## 2023-12-15 DIAGNOSIS — E781 Pure hyperglyceridemia: Secondary | ICD-10-CM | POA: Diagnosis not present

## 2023-12-15 DIAGNOSIS — Z6834 Body mass index (BMI) 34.0-34.9, adult: Secondary | ICD-10-CM

## 2023-12-15 DIAGNOSIS — R7303 Prediabetes: Secondary | ICD-10-CM

## 2023-12-15 NOTE — Progress Notes (Signed)
.smr  Office: [phone]  /  Fax: [phone]  WEIGHT SUMMARY AND BIOMETRICS  Anthropometric Measurements Height: 5\' 6"  (1.676 m) Weight: 216 lb (98 kg) BMI (Calculated): 34.88 Weight at Last Visit: 208 lb Weight Lost Since Last Visit: 0 Weight Gained Since Last Visit: 8 lb Starting Weight: 237 lb Total Weight Loss (lbs): 21 lb (9.526 kg) Peak Weight: 270 lb   Body Composition  Body Fat %: 36.1 % Fat Mass (lbs): 78 lbs Muscle Mass (lbs): 131.4 lbs Total Body Water (lbs): 103.6 lbs Visceral Fat Rating : 24   Other Clinical Data Fasting: Yes Labs: Yes Today's Visit #: 69 Starting Date: 08/18/18    Chief Complaint: OBESITY   History of Present Illness   The patient presents today for a follow-up consultation regarding his hypertriglyceridemia, insulin resistance, and vitamin D deficiency. He has been working on diet, exercise, and weight loss, but has struggled in the past two months, gaining eight pounds since the last visit. He reports adhering to his category three eating plan about 80% of the time. 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  The patient recently returned from a two-week cruise at the end of October, during which he experienced a weight gain that he has only partially lost. He reports fluctuations in weight, sometimes as much as two pounds in a day. He does not feel he is retaining fluid, but notes that his rings fit more tightly than usual, suggesting possible fluid retention.  In terms of diet, the patient is planning a trip to [place] for a week during the holiday season, where he anticipates a change in dietary control due to being a guest. He expresses concern about potential dietary temptations, particularly his granddaughter's cheesecake. He also discusses a recipe involving olive oil, lemon juice, and seasonings that he uses on fish, and expresses curiosity about the  health implications of using olive oil.  The patient is due for labs but was not fasting at the time of the consultation, so labs will be scheduled for a future date.          PHYSICAL EXAM:  Blood pressure 121/76, pulse 66, temperature (!) 97.4 F (36.3 C), height 5\' 6"  (1.676 m), weight 216 lb (98 kg), SpO2 98%. Body mass index is 34.86 kg/m.  DIAGNOSTIC DATA REVIEWED:  BMET    Component Value Date/Time   NA 140 05/25/2023 0921   K 4.5 05/25/2023 0921   CL 103 05/25/2023 0921   CO2 21 05/25/2023 0921   GLUCOSE 84 05/25/2023 0921   GLUCOSE 95 12/29/2021 1432   BUN 24 05/25/2023 0921   CREATININE 0.87 05/25/2023 0921   CREATININE 0.92 09/11/2020 0926   CALCIUM 9.1 05/25/2023 0921   GFRNONAA >60 12/29/2021 1432   GFRAA 82 12/03/2020 0930   Lab Results  Component Value Date   HGBA1C 6.1 (H) 05/25/2023   HGBA1C 5.8 (H) 08/18/2017   Lab Results  Component Value Date   INSULIN 5.3 05/25/2023   INSULIN 17.6 08/18/2017   Lab Results  Component Value Date   TSH 2.040 08/10/2022   CBC    Component Value Date/Time   WBC 5.3 09/13/2022 0925   WBC 10.6 (H) 12/29/2021 1432   RBC 4.82 09/13/2022 0925   RBC 4.70 12/29/2021 1432   HGB 15.7 09/13/2022 0925   HCT 46.7 09/13/2022 0925   PLT 177 09/13/2022 0925   MCV 97 09/13/2022 0925   MCH 32.6 09/13/2022  0925   MCH 33.0 12/29/2021 1432   MCHC 33.6 09/13/2022 0925   MCHC 35.1 12/29/2021 1432   RDW 13.0 09/13/2022 0925   Iron Studies No results found for: "IRON", "TIBC", "FERRITIN", "IRONPCTSAT" Lipid Panel     Component Value Date/Time   CHOL 152 05/25/2023 0921   TRIG 62 05/25/2023 0921   HDL 61 05/25/2023 0921   CHOLHDL 2.5 08/10/2022 1206   CHOLHDL 3 10/11/2019 1139   VLDL 17.4 10/11/2019 1139   LDLCALC 78 05/25/2023 0921   Hepatic Function Panel     Component Value Date/Time   PROT 6.6 05/25/2023 0921   ALBUMIN 4.1 05/25/2023 0921   AST 19 05/25/2023 0921   ALT 18 05/25/2023 0921   ALKPHOS 64  05/25/2023 0921   BILITOT 0.8 05/25/2023 0921   BILIDIR 0.1 02/11/2014 1016   IBILI 0.3 02/11/2014 1016      Component Value Date/Time   TSH 2.040 08/10/2022 1206   Nutritional Lab Results  Component Value Date   VD25OH 60.5 05/25/2023   VD25OH 69.8 01/18/2023   VD25OH 73.1 08/10/2022     Assessment and Plan    Hypertriglyceridemia Hypertriglyceridemia with recent eight-pound weight gain, partially due to water retention. Following category three eating plan 80% of the time, physically active with woodworking but not exercising. Discussed diet and exercise importance, especially during holidays, and strategies to manage temptations. Fluid retention likely due to increased sodium and simple carbohydrates intake. - Order fasting lipid panel at next visit - Encourage adherence to diet and exercise plan - Discuss strategies to manage holiday eating  Pre-DM Insulin resistance with emphasis on hydration, protein intake, and not skipping meals. Discussed weight fluctuations due to fluid shifts and importance of not relying solely on the scale. Patient aware of the need to manage diet and exercise to control weight and insulin resistance. - Order fasting glucose and HbA1c at next visit - Encourage adherence to diet and exercise plan - Monitor weight and dietary habits  Vitamin D Deficiency Known vitamin D deficiency. - Order vitamin D level at next visit  General Health Maintenance Discussed benefits of olive oil compared to animal-based fats. Recommended using spray olive oil to reduce calorie intake while cooking. Discussed benefits of using an air fryer for healthier cooking. - Recommend using spray olive oil for cooking - Encourage use of air fryer for healthier cooking  Follow-up - Schedule follow-up appointment for the first or second week of February - Review lab results and adjust treatment plan as necessary.        He was informed of the importance of frequent follow  up visits to maximize his success with intensive lifestyle modifications for his multiple health conditions.    Quillian Quince, MD

## 2023-12-16 LAB — INSULIN, RANDOM: INSULIN: 5.7 u[IU]/mL (ref 2.6–24.9)

## 2023-12-16 LAB — CMP14+EGFR
ALT: 16 [IU]/L (ref 0–44)
AST: 20 [IU]/L (ref 0–40)
Albumin: 4 g/dL (ref 3.8–4.8)
Alkaline Phosphatase: 62 [IU]/L (ref 44–121)
BUN/Creatinine Ratio: 23 (ref 10–24)
BUN: 23 mg/dL (ref 8–27)
Bilirubin Total: 1 mg/dL (ref 0.0–1.2)
CO2: 23 mmol/L (ref 20–29)
Calcium: 9.4 mg/dL (ref 8.6–10.2)
Chloride: 104 mmol/L (ref 96–106)
Creatinine, Ser: 1.02 mg/dL (ref 0.76–1.27)
Globulin, Total: 2.6 g/dL (ref 1.5–4.5)
Glucose: 84 mg/dL (ref 70–99)
Potassium: 4.6 mmol/L (ref 3.5–5.2)
Sodium: 142 mmol/L (ref 134–144)
Total Protein: 6.6 g/dL (ref 6.0–8.5)
eGFR: 74 mL/min/{1.73_m2} (ref >59)

## 2023-12-16 LAB — HEMOGLOBIN A1C
Est. average glucose Bld gHb Est-mCnc: 120 mg/dL
Hgb A1c MFr Bld: 5.8 % — ABNORMAL HIGH (ref 4.8–5.6)

## 2023-12-16 LAB — LIPID PANEL WITH LDL/HDL RATIO
Cholesterol, Total: 135 mg/dL (ref 100–199)
HDL: 49 mg/dL (ref >39)
LDL Chol Calc (NIH): 74 mg/dL (ref 0–99)
LDL/HDL Ratio: 1.5 {ratio} (ref 0.0–3.6)
Triglycerides: 54 mg/dL (ref 0–149)
VLDL Cholesterol Cal: 12 mg/dL (ref 5–40)

## 2023-12-16 LAB — VITAMIN D 25 HYDROXY (VIT D DEFICIENCY, FRACTURES): Vit D, 25-Hydroxy: 66.3 ng/mL (ref 30.0–100.0)

## 2023-12-16 LAB — VITAMIN B12: Vitamin B-12: 789 pg/mL (ref 232–1245)

## 2023-12-30 ENCOUNTER — Other Ambulatory Visit: Payer: Self-pay | Admitting: Cardiology

## 2023-12-30 DIAGNOSIS — I4821 Permanent atrial fibrillation: Secondary | ICD-10-CM

## 2023-12-30 NOTE — Telephone Encounter (Signed)
 Eliquis 5mg  refill request received. Patient is 83 years old, weight-98kg, Crea-1.02 on 12/15/23, Diagnosis-Afib, and last seen by Dr Herbie Baltimore on 04/13/23. Dose is appropriate based on dosing criteria. Will send in refill to requested pharmacy.

## 2024-01-04 ENCOUNTER — Encounter: Payer: Self-pay | Admitting: Physical Medicine & Rehabilitation

## 2024-01-04 ENCOUNTER — Other Ambulatory Visit: Payer: Self-pay | Admitting: Family Medicine

## 2024-01-04 ENCOUNTER — Encounter: Payer: Medicare Other | Attending: Physical Medicine & Rehabilitation | Admitting: Physical Medicine & Rehabilitation

## 2024-01-04 VITALS — BP 123/88 | HR 76 | Ht 66.0 in | Wt 226.0 lb

## 2024-01-04 DIAGNOSIS — B0229 Other postherpetic nervous system involvement: Secondary | ICD-10-CM | POA: Insufficient documentation

## 2024-01-04 MED ORDER — PREGABALIN 50 MG PO CAPS: 50.0000 mg | ORAL_CAPSULE | Freq: Three times a day (TID) | ORAL | 2 refills | Status: DC

## 2024-01-04 NOTE — Patient Instructions (Addendum)
 ALWAYS FEEL FREE TO CALL OUR OFFICE WITH ANY PROBLEMS OR QUESTIONS [phone])  **PLEASE NOTE** ALL MEDICATION REFILL REQUESTS (INCLUDING CONTROLLED SUBSTANCES) NEED TO BE MADE AT LEAST 7 DAYS PRIOR TO REFILL BEING DUE. ANY REFILL REQUESTS INSIDE THAT TIME FRAME MAY RESULT IN DELAYS IN RECEIVING YOUR PRESCRIPTION.    BEGIN LYRICA  50 MG AT NIGHT FOR ONE WEEK THEN TWICE DAILY FOR ONE WEEK, THEN INCREASE TO 3 X DAILY   KEEP GABAPENTIN  THE SAME.

## 2024-01-04 NOTE — Progress Notes (Signed)
 Subjective:    Patient ID: [name] [name], male    DOB: [birth year], 83 y.o.   MRN: [id number]  HPI   [name] [name] is here in follow up his postherpetic neuralgia.  I last saw him in September.  We were going to set him up for Qutenza  patch application.  However his co-pay was nearly $900 and they were looking to perhaps pushing into 2025 where they may have better coverage.  His pain continues to be similar.  He remains on gabapentin  3 times daily.  Pain is worse in the evening and first thing when he gets up in the morning.  When he stays active and is moving around during the day pain is not nearly as bad.  They have tried some capsaicin  cream at home and he is had a hard time tolerating it due to the burning sensation with the cream.  We talked more about his previous Lyrica  use.  Apparently he tried it initially with gabapentin  but never received a taper for the gabapentin .  Thus when it was discovered he was using both medications the Lyrica  was then stopped.  Denies any new health issues currently.  Otherwise he is feeling fairly well.   Pain Inventory Average Pain 4 Pain Right Now 3 My pain is intermittent, constant, and picks  In the last 24 hours, has pain interfered with the following? General activity 5 Relation with others 0 Enjoyment of life 0 What TIME of day is your pain at its worst? morning  and evening Sleep (in general) Good  Pain is worse with: some activites and changes in the weather Pain improves with: rest and medication Relief from Meds: 5  Family History  Problem Relation Age of Onset   Cancer Mother 46       MM, leukemia   Obesity Mother    Emphysema Father 62   Obesity Father    Alcohol  abuse Father    Cancer Sister 62       brain   Diabetes Sister 21   Obesity Sister    COPD Brother 12   Down syndrome Brother 68       aspirated   Diabetes Son    Pancreatitis Son    Heart disease Paternal Grandmother    COPD Paternal Grandfather     Alcohol  abuse Other    Colon cancer Neg Hx    Social History   Socioeconomic History   Marital status: Married    Spouse name: [name]   Number of children: 2   Years of education: Not on file   Highest education level: Associate degree: occupational, scientist, product/process development, or vocational program  Occupational History   Occupation: retired    Comment: truck driver  Tobacco Use   Smoking status: Former    Types: Pipe, Software Engineer    Quit date: 02/11/1989    Years since quitting: 34.9   Smokeless tobacco: Never  Vaping Use   Vaping status: Never Used  Substance and Sexual Activity   Alcohol  use: Yes    Alcohol /week: 7.0 standard drinks of alcohol     Types: 7 Glasses of wine per week    Comment: 1 per day -- brandy or wine   Drug use: No   Sexual activity: Yes    Comment: lives with wife, retired from [employer] driving, no dietary restrictions  Other Topics Concern   Not on file  Social History Narrative   He is a married father of 2, and grandfather of 4. He has  been married to his wife [name] [name] for 51 years. He previously lived in [place]  but has moved with his wife to [place], [place] to be close to his daughter [name] [name] and her family. 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   He previously worked as a naval architect and had 2 years of college after high school. He quit smoking in 1990. He has 6-7 glasses of brandy or wine a week.    He exercises regularly with low impact aerobics 2 days a week and daily walks of 30-45 minutes. His exercise sessions or 60 minutes. He will do some type of exercise at least 7 days a week and is very active doing yard work and wood work.      Patient is right-handed. He lives with his wife in a one level home with a basement. He drinks 4-5 cups of decaf coffee a day. He and his wife go to the gym 1-2 x a week.   Social Drivers of Corporate Investment Banker Strain: Low Risk  (07/04/2023)   Overall Financial Resource Strain (CARDIA)    Difficulty of Paying Living Expenses:  Not hard at all  Food Insecurity: No Food Insecurity (07/04/2023)   Hunger Vital Sign    Worried About Running Out of Food in the Last Year: Never true    Ran Out of Food in the Last Year: Never true  Transportation Needs: No Transportation Needs (07/04/2023)   PRAPARE - Administrator, Civil Service (Medical): No    Lack of Transportation (Non-Medical): No  Physical Activity: Sufficiently Active (07/04/2023)   Exercise Vital Sign    Days of Exercise per Week: 7 days    Minutes of Exercise per Session: 60 min  Stress: No Stress Concern Present (07/04/2023)   [employer] of Occupational Health - Occupational Stress Questionnaire    Feeling of Stress : Not at all  Social Connections: Moderately Isolated (07/04/2023)   Social Connection and Isolation Panel [NHANES]    Frequency of Communication with Friends and Family: Three times a week    Frequency of Social Gatherings with Friends and Family: More than three times a week    Attends Religious Services: Never    Database Administrator or Organizations: No    Attends Engineer, Structural: Not on file    Marital Status: Married   Past Surgical History:  Procedure Laterality Date   CATARACT EXTRACTION Right    COLONOSCOPY  2018   CYSTOSCOPY/URETEROSCOPY/HOLMIUM LASER/STENT PLACEMENT Left 10/05/2019   Procedure: CYSTOSCOPY/RETROGRADE/URETEROSCOPY/HOLMIUM LASER/STENT PLACEMENT;  Surgeon: Ottelin, Mark, MD;  Location: [facility] [place];  Service: Urology;  Laterality: Left;   EXTRACORPOREAL SHOCK WAVE LITHOTRIPSY Left 01/25/2022   Procedure: EXTRACORPOREAL SHOCK WAVE LITHOTRIPSY (ESWL);  Surgeon: Renda Glance, MD;  Location: [facility];  Service: Urology;  Laterality: Left;   EYE SURGERY Left 12/06/2017   cataract by Dr Ruth FONTANA HERNIA REPAIR Bilateral 06/01/2019   Procedure: LAPAROSCOPIC LEFT AND  RIGHT INGUINAL HERNIA REPAIR WITH MESH;  Surgeon: Sheldon Standing, MD;  Location: [facility];  Service: General;  Laterality: Bilateral;   LEFT HEART CATHETERIZATION WITH CORONARY ANGIOGRAM N/A 04/10/2015   Procedure: LEFT HEART CATHETERIZATION WITH  CORONARY ANGIOGRAM;  Surgeon: Alm LELON Clay, MD;  Location: [facility] CATH LAB;  Service: Cardiovascular;  Angiographicallly NORMAL CORONARY ARTERIES   MASS EXCISION N/A 05/16/2014   Procedure: EXCISION POSTERIOR NECK MASS, RIGHT CHEST WALL MASS AND RIGHT AXILLARY MASS AXILLARY NODE DISSECTION;  Surgeon: Elspeth KYM Schultze, MD;  Location: [facility];  Service: General;  Laterality: N/A;   melanoma removal     MOUTH SURGERY N/A    NM MYOVIEW  LTD  03/19/2015   FALSE POSITIVE:  INTERMEDIATE RISK. Small sized, moderate intensity inferior ischemic perfusion defect   REVERSE SHOULDER ARTHROPLASTY Left 10/16/2020   Procedure: REVERSE SHOULDER ARTHROPLASTY;  Surgeon: Melita Drivers, MD;  Location: [facility];  Service: Orthopedics;  Laterality: Left;    TRANSTHORACIC ECHOCARDIOGRAM  03/20/2015   Normal LV size and function. EF 60-65%. G1 DD. Trivial AI. Borderline aortic root dilation (41 mm), Mild LA dilation.   VEIN LIGATION AND STRIPPING Left 07/13/2021   Procedure: LIGATION AND STRIPPING OF LEFT GREAT SAPHENOUS VEIN WITH STAB PHLEBECTOMY GREATER THAN 20 INCISIONS TO LEFT LEG;  Surgeon: Eliza Lonni RAMAN, MD;  [facility];  Service: Vascular;  Laterality: Left;   Past Surgical History:  Procedure Laterality Date   CATARACT EXTRACTION Right    COLONOSCOPY  2018   CYSTOSCOPY/URETEROSCOPY/HOLMIUM LASER/STENT PLACEMENT Left 10/05/2019   Procedure: CYSTOSCOPY/RETROGRADE/URETEROSCOPY/HOLMIUM LASER/STENT PLACEMENT;  Surgeon: Ottelin, Mark, MD;  Location: [facility];  Service: Urology;  Laterality: Left;   EXTRACORPOREAL SHOCK WAVE LITHOTRIPSY Left 01/25/2022   Procedure: EXTRACORPOREAL SHOCK WAVE LITHOTRIPSY (ESWL);  Surgeon: Renda Glance, MD;  Location[facility];  Service: Urology;  Laterality: Left;    EYE SURGERY Left 12/06/2017   cataract by Dr Ruth FONTANA HERNIA REPAIR Bilateral 06/01/2019   Procedure: LAPAROSCOPIC LEFT AND  RIGHT INGUINAL HERNIA REPAIR WITH MESH;  Surgeon: Schultze Elspeth, MD;  Location: [facility] [place];  Service: General;  Laterality: Bilateral;   LEFT HEART CATHETERIZATION WITH CORONARY ANGIOGRAM N/A 04/10/2015   Procedure: LEFT HEART CATHETERIZATION WITH  CORONARY ANGIOGRAM;  Surgeon: Alm LELON Clay, MD;  Location: [facility] CATH LAB;  Service: Cardiovascular;  Angiographicallly NORMAL CORONARY ARTERIES   MASS EXCISION N/A 05/16/2014   Procedure: EXCISION POSTERIOR NECK MASS, RIGHT CHEST WALL MASS AND RIGHT AXILLARY MASS AXILLARY NODE DISSECTION;  Surgeon: Elspeth KYM Schultze, MD;  Location: [facility];  Service: General;  Laterality: N/A;   melanoma removal     MOUTH SURGERY N/A    NM MYOVIEW  LTD  03/19/2015   FALSE POSITIVE:  INTERMEDIATE RISK. Small sized, moderate intensity inferior ischemic perfusion defect   REVERSE SHOULDER ARTHROPLASTY Left 10/16/2020   Procedure: REVERSE SHOULDER ARTHROPLASTY;  Surgeon: Melita Drivers, MD;  Location: [facility];  Service: Orthopedics;  Laterality: Left;    TRANSTHORACIC ECHOCARDIOGRAM  03/20/2015   Normal LV size and function. EF 60-65%. G1 DD. Trivial AI. Borderline aortic root dilation (41 mm), Mild LA dilation.   VEIN LIGATION AND STRIPPING Left 07/13/2021   Procedure: LIGATION AND STRIPPING OF LEFT GREAT SAPHENOUS VEIN WITH STAB PHLEBECTOMY GREATER THAN 20 INCISIONS TO LEFT LEG;  Surgeon: Eliza Lonni RAMAN, MD;  Location: [facility] OR;  Service: Vascular;  Laterality: Left;   Past Medical History:  Diagnosis Date   Atrial fibrillation (HCC)    BPH (benign prostatic hypertrophy)    Bradycardia 12/30/2014   HR routinely in mid 40s-50s   Coronary artery disease (CAD) excluded 03/2015   False-positive nuclear stress test suggesting inferior ischemia   Dysrhythmia    Elevated PSA    Encounter for Medicare annual  wellness exam 09/22/2013   Sees Dr Rolan Molt of Derm Sees Dr Lupita Commander of Gastroenterology Sees Dr Mark Ottelin of [facility] Sees Dr Randon Bile of Optometry   GERD (gastroesophageal  reflux disease)    controlled w/ diet and behavioral changes, history of   Grade I diastolic dysfunction 2016   History of kidney stones    Hypertriglyceridemia 03/16/2015   Kidney stones    Lymphadenitis    Melanoma of back (HCC)    Excised Dr Rolan Molt   Mild ascending aorta dilatation (HCC)    New onset a-fib (HCC) 03/10/2015   OA (osteoarthritis)    Otitis, externa, infective 08/10/2015   Palpitations    Pneumonia 2004   Post herpetic neuralgia    Shingles 07/09/2013   Sleep apnea 07/30/2011   cpap- 13    Snoring disorder 07/30/2011   Wt 226 lb (102.5 kg)   BMI 36.48 kg/m   Opioid Risk Score:   Fall Risk Score:  `1  Depression screen [facility] 2/9     08/31/2023    1:46 PM 07/11/2023   10:13 AM 08/10/2022   10:31 AM 08/09/2022   10:17 AM 08/05/2021   11:06 AM 08/04/2020   11:42 AM 08/18/2017    9:29 AM  Depression screen PHQ 2/9  Decreased Interest 0 0 0 0 0 0 1  Down, Depressed, Hopeless 0 0 0 0 0 0 0  PHQ - 2 Score 0 0 0 0 0 0 1  Altered sleeping 0  0    3  Tired, decreased energy 0  0    2  Change in appetite 0  0    0  Feeling bad or failure about yourself  0  0    1  Trouble concentrating 0  0    0  Moving slowly or fidgety/restless 0  0    0  Suicidal thoughts 0  0    0  PHQ-9 Score 0  0    7  Difficult doing work/chores Not difficult at all  Not difficult at all    Not difficult at all    Review of Systems  Musculoskeletal:        Right arm auxiliary & right shoulder pain  All other systems reviewed and are negative.      Objective:   Physical Exam  General: No acute distress HEENT: NCAT, EOMI, oral membranes moist Cards: reg rate  Chest: normal effort Abdomen: Soft, NT, ND Skin: dry, intact Extremities: no edema Psych: pleasant and appropriate  Skin:  Clean and intact without signs of breakdown Neuro:  Alert and oriented x 3. Normal insight and awareness. Intact Memory. Normal language and speech. Cranial nerve exam unremarkable. MMT: 5/5. SABRA   Musculoskeletal: right axilla and infrscapular area tender to palpation with anterior extension to outer right pec. About 3-4 in thickness --this area is consistent.         Assessment & Plan:  Chronic post-herpetic neuralgia along the right T3, T4 dermatome.  On chronic gabapentin  2. Chronic lymphedema RUE     Plan: Qutenza  patch to right axilla--we will check on insurance coverage in 2025.  Perhaps he could be a candidate for financial assistance. Continue same gabapentin .  However we will add Lyrica  50 mg nightly titrating up to 3 times daily over 2 weeks time.  Will allow a period of overlap of both medications.     Twenty minutes of face to face patient care time were spent during this visit. All questions were encouraged and answered.  Follow up with me in about a month for potential Qutenza  patch placement

## 2024-01-12 ENCOUNTER — Encounter: Payer: Self-pay | Admitting: Family Medicine

## 2024-01-19 ENCOUNTER — Other Ambulatory Visit: Payer: Self-pay | Admitting: Medical Genetics

## 2024-01-23 ENCOUNTER — Ambulatory Visit (INDEPENDENT_AMBULATORY_CARE_PROVIDER_SITE_OTHER): Payer: Medicare Other | Admitting: Physician Assistant

## 2024-01-23 ENCOUNTER — Encounter: Payer: Self-pay | Admitting: Physician Assistant

## 2024-01-23 ENCOUNTER — Encounter: Payer: Medicare Other | Admitting: Family Medicine

## 2024-01-23 VITALS — BP 122/82 | HR 59 | Temp 97.5°F | Resp 12 | Ht 66.0 in | Wt 222.2 lb

## 2024-01-23 DIAGNOSIS — R7303 Prediabetes: Secondary | ICD-10-CM

## 2024-01-23 DIAGNOSIS — M25562 Pain in left knee: Secondary | ICD-10-CM

## 2024-01-23 DIAGNOSIS — I4821 Permanent atrial fibrillation: Secondary | ICD-10-CM

## 2024-01-23 DIAGNOSIS — Z0000 Encounter for general adult medical examination without abnormal findings: Secondary | ICD-10-CM

## 2024-01-23 DIAGNOSIS — E781 Pure hyperglyceridemia: Secondary | ICD-10-CM | POA: Diagnosis not present

## 2024-01-23 NOTE — Assessment & Plan Note (Signed)
Improved diet/exercise 11/2023 labs, trigs 54

## 2024-01-23 NOTE — Progress Notes (Signed)
Complete physical exam   Patient: [name] [name]   DOB: Mar 05, 1941   83 y.o. Male  MRN: [id number] Visit Date: 01/23/2024  Today's healthcare provider: Alfredia Ferguson, PA-C   Chief Complaint  Patient presents with   Annual Exam   Subjective    [name] [name] is a 83 y.o. male who presents today for a complete physical exam.  Pt reports he intermittently gets L medial knee pain when sitting in the car.Denies other instances of knee pain. No other concerns today. Reports recent, 09/2023 RSV vaccine  Past Medical History:  Diagnosis Date   Atrial fibrillation (HCC)    BPH (benign prostatic hypertrophy)    Bradycardia 12/30/2014   HR routinely in mid 40s-50s   Coronary artery disease (CAD) excluded 03/2015   False-positive nuclear stress test suggesting inferior ischemia   Dysrhythmia    Elevated PSA    Encounter for Medicare annual wellness exam 09/22/2013   Sees Dr Karlyn Agee of Derm Sees Dr Stan Head of Gastroenterology Sees Dr Ihor Gully of [facility] Sees Dr Normand Sloop of Optometry   GERD (gastroesophageal reflux disease)    controlled w/ diet and behavioral changes, history of   Grade I diastolic dysfunction 2016   History of kidney stones    Hypertriglyceridemia 03/16/2015   Kidney stones    Lymphadenitis    Melanoma of back (HCC)    Excised Dr Karlyn Agee   Mild ascending aorta dilatation (HCC)    New onset a-fib (HCC) 03/10/2015   OA (osteoarthritis)    Otitis, externa, infective 08/10/2015   Palpitations    Pneumonia 2004   Post herpetic neuralgia    Shingles 07/09/2013   Sleep apnea 07/30/2011   cpap- 13    Snoring disorder 07/30/2011   Past Surgical History:  Procedure Laterality Date   CATARACT EXTRACTION Right    COLONOSCOPY  2018   CYSTOSCOPY/URETEROSCOPY/HOLMIUM LASER/STENT PLACEMENT Left 10/05/2019   Procedure: CYSTOSCOPY/RETROGRADE/URETEROSCOPY/HOLMIUM LASER/STENT PLACEMENT;  Surgeon: Ihor Gully, MD;  Location: [facility];  Service: Urology;  Laterality: Left;   EXTRACORPOREAL SHOCK WAVE LITHOTRIPSY Left 01/25/2022   Procedure: EXTRACORPOREAL SHOCK WAVE LITHOTRIPSY (ESWL);  Surgeon: Heloise Purpura, MD;  Location: [facility];  Service: Urology;  Laterality: Left;   EYE SURGERY Left 12/06/2017   cataract by Dr Dyanne Carrel HERNIA REPAIR Bilateral 06/01/2019   Procedure: LAPAROSCOPIC LEFT AND  RIGHT INGUINAL HERNIA REPAIR WITH MESH;  Surgeon: Karie Soda, MD;  Location: [facility];  Service: General;  Laterality: Bilateral;   LEFT HEART CATHETERIZATION WITH CORONARY ANGIOGRAM N/A 04/10/2015   Procedure: LEFT HEART CATHETERIZATION WITH  CORONARY ANGIOGRAM;  Surgeon: Marykay Lex, MD;  Location: [facility] CATH LAB;  Service: Cardiovascular;  Angiographicallly NORMAL CORONARY ARTERIES   MASS EXCISION N/A 05/16/2014   Procedure: EXCISION POSTERIOR NECK MASS, RIGHT CHEST WALL MASS AND RIGHT AXILLARY MASS AXILLARY NODE DISSECTION;  Surgeon: Ardeth Sportsman, MD;  Location: [facility];  Service: General;  Laterality: N/A;   melanoma removal     MOUTH SURGERY N/A    NM MYOVIEW LTD  03/19/2015   FALSE POSITIVE:  INTERMEDIATE RISK. Small sized, moderate intensity inferior ischemic perfusion defect   REVERSE SHOULDER ARTHROPLASTY Left 10/16/2020   Procedure: REVERSE SHOULDER ARTHROPLASTY;  Surgeon: Francena Hanly, MD;  Location: [facility];  Service: Orthopedics;  Laterality: Left;    TRANSTHORACIC ECHOCARDIOGRAM  03/20/2015   Normal LV size and function. EF 60-65%. G1 DD. Trivial AI. Borderline  aortic root dilation (41 mm), Mild LA dilation.   VEIN LIGATION AND STRIPPING Left 07/13/2021   Procedure: LIGATION AND STRIPPING OF LEFT GREAT SAPHENOUS VEIN WITH STAB PHLEBECTOMY GREATER THAN 20 INCISIONS TO LEFT LEG;  Surgeon: Chuck Hint, MD;  Location: [facility];  Service: Vascular;  Laterality: Left;   Social History   Socioeconomic History   Marital status: Married     Spouse name: [name] [name]   Number of children: 2   Years of education: Not on file   Highest education level: Associate degree: occupational, Scientist, product/process development, or vocational program  Occupational History   Occupation: retired    Comment: truck driver  Tobacco Use   Smoking status: Former    Types: Pipe, Cigars    Quit date: 02/11/1989    Years since quitting: 34.9   Smokeless tobacco: Never  Vaping Use   Vaping status: Never Used  Substance and Sexual Activity   Alcohol use: Yes    Alcohol/week: 7.0 standard drinks of alcohol    Types: 7 Glasses of wine per week    Comment: 1 per day -- brandy or wine   Drug use: No   Sexual activity: Yes    Comment: lives with wife, retired from [employer] driving, no dietary restrictions  Other Topics Concern   Not on file  Social History Narrative   He is a married father of 2, and grandfather of 4. He has been married to his wife [name] [name] for 51 years. He previously lived in [place] but has moved with his wife to [place], [place] to be close to his daughter [name] [name] and her family. 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   He previously worked as a Naval architect and had 2 years of college after high school. He quit smoking in 1990. He has 6-7 glasses of brandy or wine a week.    He exercises regularly with low impact aerobics 2 days a week and daily walks of 30-45 minutes. His exercise sessions or 60 minutes. He will do some type of exercise at least 7 days a week and is very active doing yard work and wood work.      Patient is right-handed. He lives with his wife in a one level home with a basement. He drinks 4-5 cups of decaf coffee a day. He and his wife go to the gym 1-2 x a week.   Social Drivers of Corporate investment banker Strain: Low Risk  (07/04/2023)   Overall Financial Resource Strain (CARDIA)    Difficulty of Paying Living Expenses: Not hard at all  Food Insecurity: No Food Insecurity (07/04/2023)   Hunger Vital Sign    Worried About Running Out of  Food in the Last Year: Never true    Ran Out of Food in the Last Year: Never true  Transportation Needs: No Transportation Needs (07/04/2023)   PRAPARE - Administrator, Civil Service (Medical): No    Lack of Transportation (Non-Medical): No  Physical Activity: Sufficiently Active (07/04/2023)   Exercise Vital Sign    Days of Exercise per Week: 7 days    Minutes of Exercise per Session: 60 min  Stress: No Stress Concern Present (07/04/2023)   [employer] of Occupational Health - Occupational Stress Questionnaire    Feeling of Stress : Not at all  Social Connections: Moderately Isolated (07/04/2023)   Social Connection and Isolation Panel [NHANES]    Frequency of Communication with Friends and Family: Three times a week  Frequency of Social Gatherings with Friends and Family: More than three times a week    Attends Religious Services: Never    Database administrator or Organizations: No    Attends Banker Meetings: Not on file    Marital Status: Married  Catering manager Violence: Not At Risk (08/05/2021)   Humiliation, Afraid, Rape, and Kick questionnaire    Fear of Current or Ex-Partner: No    Emotionally Abused: No    Physically Abused: No    Sexually Abused: No   Family Status  Relation Name Status   Mother  Deceased at age 50   Father  Deceased at age 30       smoker,suicide   Sister  Deceased at age 31   Sister M1/2 Deceased at age 30   Brother  Deceased       63, smoker   Brother  Deceased at age 65   Daughter  Alive       47   Son 31 Alive       72   MGM  Deceased at age 37       pneumonia   MGF  Deceased at age 50   PGM  Deceased at age 1   PGF  Deceased at age 80   Other family hx of (Not Specified)   Neg Hx  (Not Specified)  No partnership data on file   Family History  Problem Relation Age of Onset   Cancer Mother 74       MM, leukemia   Obesity Mother    Emphysema Father 25   Obesity Father    Alcohol abuse Father     Cancer Sister 56       brain   Diabetes Sister 18   Obesity Sister    COPD Brother 21   Down syndrome Brother 53       aspirated   Diabetes Son    Pancreatitis Son    Heart disease Paternal Grandmother    COPD Paternal Grandfather    Alcohol abuse Other    Colon cancer Neg Hx    No Known Allergies  Patient Care Team: Bradd Canary, MD as PCP - General (Family Medicine) Marykay Lex, MD as PCP - Cardiology (Cardiology) Iva Boop, MD as Consulting Physician (Gastroenterology) Arminda Resides, MD as Consulting Physician (Dermatology) Ihor Gully, MD (Inactive) as Consulting Physician (Urology) Marykay Lex, MD as Consulting Physician (Cardiology) Romelle Starcher, DDS as Consulting Physician (Dentistry) Karie Soda, MD as Consulting Physician (General Surgery) Day, Dannielle Karvonen, [facility] (Inactive) (Pharmacist) Day, Dannielle Karvonen, [facility] (Inactive) as Pharmacist (Pharmacist)   Medications: Outpatient Medications Prior to Visit  Medication Sig   Cholecalciferol (VITAMIN D3) 125 MCG (5000 UT) CAPS Take 1 capsule (5,000 Units total) by mouth daily. (Patient taking differently: Take 5,000 Units by mouth daily.)   ELIQUIS 5 MG TABS tablet Take 1 tablet by mouth twice daily   gabapentin (NEURONTIN) 300 MG capsule TAKE 3 CAPSULES BY MOUTH THREE TIMES DAILY   Multiple Vitamins-Minerals (MULTIVITAMIN MEN 50+) TABS Take by mouth daily.   pregabalin (LYRICA) 50 MG capsule Take 1 capsule (50 mg total) by mouth 3 (three) times daily.   sildenafil (VIAGRA) 100 MG tablet Take 100 mg by mouth daily as needed for erectile dysfunction.   tamsulosin (FLOMAX) 0.4 MG CAPS capsule Take 1 capsule (0.4 mg total) by mouth at bedtime.   [DISCONTINUED] benzonatate (TESSALON) 200 MG capsule Take 1 capsule (200 mg total)  by mouth 3 (three) times daily as needed.   [DISCONTINUED] influenza vaccine adjuvanted (FLUAD QUADRIVALENT) 0.5 ML injection Inject into the muscle.   [DISCONTINUED] influenza vaccine  adjuvanted (FLUAD) 0.5 ML injection Inject into the muscle.   [DISCONTINUED] OVER THE COUNTER MEDICATION Apply 1 application topically daily as needed (Pain). Hemp freeze   No facility-administered medications prior to visit.    Review of Systems  Constitutional:  Negative for fatigue and fever.  Respiratory:  Negative for cough and shortness of breath.   Cardiovascular:  Negative for chest pain, palpitations and leg swelling.  Genitourinary:  Positive for frequency.  Neurological:  Negative for dizziness and headaches.      Objective    BP 122/82 (BP Location: Left Arm, Cuff Size: Normal)   Pulse (!) 59   Temp (!) 97.5 F (36.4 C) (Oral)   Resp 12   Ht 5\' 6"  (1.676 m)   Wt 222 lb 3.2 oz (100.8 kg)   SpO2 100%   BMI 35.86 kg/m    Physical Exam Constitutional:      General: He is awake.     Appearance: He is well-developed.  HENT:     Head: Normocephalic.     Right Ear: Tympanic membrane, ear canal and external ear normal.     Left Ear: Tympanic membrane, ear canal and external ear normal.     Nose: Nose normal. No congestion or rhinorrhea.     Mouth/Throat:     Mouth: Mucous membranes are moist.     Pharynx: No oropharyngeal exudate or posterior oropharyngeal erythema.  Eyes:     Pupils: Pupils are equal, round, and reactive to light.  Cardiovascular:     Rate and Rhythm: Normal rate and regular rhythm.     Heart sounds: Normal heart sounds.  Pulmonary:     Effort: Pulmonary effort is normal.     Breath sounds: Normal breath sounds.  Abdominal:     General: There is no distension.     Palpations: Abdomen is soft.     Tenderness: There is no abdominal tenderness. There is no guarding.  Musculoskeletal:     Cervical back: Normal range of motion.     Right lower leg: No edema.     Left lower leg: No edema.  Lymphadenopathy:     Cervical: No cervical adenopathy.  Skin:    General: Skin is warm.  Neurological:     Mental Status: He is alert and oriented to  person, place, and time.  Psychiatric:        Attention and Perception: Attention normal.        Mood and Affect: Mood normal.        Speech: Speech normal.        Behavior: Behavior normal. Behavior is cooperative.      Last depression screening scores    01/23/2024    8:42 AM 01/04/2024    9:27 AM 08/31/2023    1:46 PM  PHQ 2/9 Scores  PHQ - 2 Score 0 0 0  PHQ- 9 Score   0   Last fall risk screening    01/23/2024    8:41 AM  Fall Risk   Falls in the past year? 1  Number falls in past yr: 1  Injury with Fall? 0   Last Audit-C alcohol use screening    07/04/2023   10:03 AM  Alcohol Use Disorder Test (AUDIT)  1. How often do you have a drink containing alcohol? 4  2.  How many drinks containing alcohol do you have on a typical day when you are drinking? 0  3. How often do you have six or more drinks on one occasion? 0  AUDIT-C Score 4  4. How often during the last year have you found that you were not able to stop drinking once you had started? 0  5. How often during the last year have you failed to do what was normally expected from you because of drinking? 0  6. How often during the last year have you needed a first drink in the morning to get yourself going after a heavy drinking session? 0  7. How often during the last year have you had a feeling of guilt of remorse after drinking? 0  8. How often during the last year have you been unable to remember what happened the night before because you had been drinking? 0  9. Have you or someone else been injured as a result of your drinking? 0  10. Has a relative or friend or a doctor or another health worker been concerned about your drinking or suggested you cut down? 0  Alcohol Use Disorder Identification Test Final Score (AUDIT) 4   A score of 3 or more in women, and 4 or more in men indicates increased risk for alcohol abuse, EXCEPT if all of the points are from question 1   No results found for any visits on 01/23/24.   Assessment & Plan    Routine Health Maintenance and Physical Exam  Exercise Activities and Dietary recommendations  --balanced diet high in fiber and protein, low in sugars, carbs, fats. --physical activity/exercise 20-30 minutes 3-5 times a week    Immunization History  Administered Date(s) Administered   Fluad Quad(high Dose 65+) 10/11/2019, 09/11/2020, 10/07/2022   Fluad Trivalent(High Dose 65+) 09/30/2023   Influenza Whole 09/15/2009, 09/23/2010   Influenza, High Dose Seasonal PF 09/21/2016, 11/07/2017, 10/06/2018   Influenza,inj,Quad PF,6+ Mos 09/20/2013, 10/18/2014, 09/08/2015   Influenza,inj,quad, With Preservative 09/27/2019   PFIZER Comirnaty(Gray Top)Covid-19 Tri-Sucrose Vaccine 05/18/2021   PFIZER(Purple Top)SARS-COV-2 Vaccination 01/11/2020, 02/01/2020, 10/03/2020   Pfizer Covid-19 Vaccine Bivalent Booster 56yrs & up 09/29/2021   Pneumococcal Conjugate-13 11/19/2013   Pneumococcal Polysaccharide-23 04/09/2003, 10/31/2015   Td 06/04/2010   Tdap 06/04/2010, 04/08/2022   Zoster Recombinant(Shingrix) 09/30/2017, 12/08/2017    Health Maintenance  Topic Date Due   Medicare Annual Wellness (AWV)  08/10/2023   DTaP/Tdap/Td (4 - Td or Tdap) 04/08/2032   Pneumonia Vaccine 24+ Years old  Completed   INFLUENZA VACCINE  Completed   Zoster Vaccines- Shingrix  Completed   HPV VACCINES  Aged Out   COVID-19 Vaccine  Discontinued    Discussed health benefits of physical activity, and encouraged him to engage in regular exercise appropriate for his age and condition.  Problem List Items Addressed This Visit       Cardiovascular and Mediastinum   Permanent atrial fibrillation (HCC) (Chronic)   Followed by cardiology, anticoagulated        Other   Hypertriglyceridemia (Chronic)   Improved diet/exercise 11/2023 labs, trigs 54      Prediabetes   Improved w/ lifestyles changes, A1c 11/2023 5.8% down from 6.1%      Other Visit Diagnoses       Annual physical exam    -   Primary     Acute pain of left knee           Left knee pain -reassured no cause for concern, likely  positional, recommending stretching/frequent breaks during long car rides  Return in about 6 months (around 07/22/2024), or if symptoms worsen or fail to improve, for chronic conditions.     Alfredia Ferguson, PA-C  [facility] [phone] (phone) [phone] (fax)  [facility]

## 2024-01-23 NOTE — Assessment & Plan Note (Signed)
Followed by cardiology, anticoagulated

## 2024-01-23 NOTE — Assessment & Plan Note (Signed)
Improved w/ lifestyles changes, A1c 11/2023 5.8% down from 6.1%

## 2024-01-25 DIAGNOSIS — D4819 Other specified neoplasm of uncertain behavior of connective and other soft tissue: Secondary | ICD-10-CM | POA: Diagnosis not present

## 2024-01-25 DIAGNOSIS — D485 Neoplasm of uncertain behavior of skin: Secondary | ICD-10-CM | POA: Diagnosis not present

## 2024-01-26 ENCOUNTER — Telehealth: Payer: Self-pay

## 2024-01-26 NOTE — Telephone Encounter (Signed)
Spoke w/ wife in regards to qutenza patches, clarified that it is covered under non-formulary with pharmacy benefits, confirmed, patches are in medroom and going forward orders will be placed under pharmacy, it has been denied under medical and he can disregard those notices.

## 2024-02-01 ENCOUNTER — Other Ambulatory Visit (HOSPITAL_COMMUNITY)
Admission: RE | Admit: 2024-02-01 | Discharge: 2024-02-01 | Disposition: A | Discharge status: Home / Self Care | Payer: Self-pay | Source: Ambulatory Visit | Attending: Medical Genetics | Admitting: Medical Genetics

## 2024-02-07 ENCOUNTER — Ambulatory Visit (INDEPENDENT_AMBULATORY_CARE_PROVIDER_SITE_OTHER): Payer: Medicare Other | Admitting: Family Medicine

## 2024-02-07 ENCOUNTER — Encounter (INDEPENDENT_AMBULATORY_CARE_PROVIDER_SITE_OTHER): Payer: Self-pay | Admitting: Family Medicine

## 2024-02-07 VITALS — BP 121/79 | HR 66 | Ht 68.5 in | Wt 213.0 lb

## 2024-02-07 DIAGNOSIS — S3992XD Unspecified injury of lower back, subsequent encounter: Secondary | ICD-10-CM

## 2024-02-07 DIAGNOSIS — Z6832 Body mass index (BMI) 32.0-32.9, adult: Secondary | ICD-10-CM

## 2024-02-07 DIAGNOSIS — M2569 Stiffness of other specified joint, not elsewhere classified: Secondary | ICD-10-CM

## 2024-02-07 DIAGNOSIS — R7303 Prediabetes: Secondary | ICD-10-CM | POA: Diagnosis not present

## 2024-02-07 DIAGNOSIS — E669 Obesity, unspecified: Secondary | ICD-10-CM | POA: Diagnosis not present

## 2024-02-07 NOTE — Progress Notes (Signed)
.smr  Office: [phone]  /  Fax: [phone]  WEIGHT SUMMARY AND BIOMETRICS  Anthropometric Measurements Height: 5' 8.5" (1.74 m) (Height Rechecked) Weight: 213 lb (96.6 kg) BMI (Calculated): 31.91 Weight at Last Visit: 216 lb Weight Lost Since Last Visit: 3 lb Weight Gained Since Last Visit: 0 Starting Weight: 237 lb Total Weight Loss (lbs): 24 lb (10.9 kg) Peak Weight: 270 lb   Body Composition  Body Fat %: 33.8 % Fat Mass (lbs): 72.2 lbs Muscle Mass (lbs): 134.4 lbs Total Body Water (lbs): 105.6 lbs Visceral Fat Rating : 22   Other Clinical Data Fasting: No Labs: No Today's Visit #: 26 Starting Date: 08/18/18 Comments: Height Retaken today    Chief Complaint: OBESITY  History of Present Illness   [name] Rood "[name]" is an 83 year old male who presents to discuss his weight management.  He has been following a category three eating plan with 95% adherence. Despite not engaging in formal exercise, he remains active through yard work and Training and development officer. He has lost three pounds since his last visit two months ago, although he gained four to five pounds over the holidays and his birthday. He is experiencing difficulty breaking a plateau at 213-214 pounds over the last two weeks.  He has a history of prediabetes with a hemoglobin A1c of 5.8% from his last visit. He monitors his weight closely and notes fluctuations, attributing some of this to water weight changes.  He has a history of a back injury from falling off a truck, resulting in crushed vertebrae. He underwent physical therapy which included specific exercises to strengthen his core and alleviate pressure on his back. He reports some limitations in bending and mild back discomfort but no significant ongoing issues. He is considering resuming some of the exercises he learned in physical therapy to improve his core strength.          PHYSICAL EXAM:  Blood pressure 121/79, pulse 66, height 5' 8.5" (1.74  m), weight 213 lb (96.6 kg), SpO2 97%. Body mass index is 31.92 kg/m.  DIAGNOSTIC DATA REVIEWED:  BMET    Component Value Date/Time   NA 142 12/15/2023 0910   K 4.6 12/15/2023 0910   CL 104 12/15/2023 0910   CO2 23 12/15/2023 0910   GLUCOSE 84 12/15/2023 0910   GLUCOSE 95 12/29/2021 1432   BUN 23 12/15/2023 0910   CREATININE 1.02 12/15/2023 0910   CREATININE 0.92 09/11/2020 0926   CALCIUM 9.4 12/15/2023 0910   GFRNONAA >60 12/29/2021 1432   GFRAA 82 12/03/2020 0930   Lab Results  Component Value Date   HGBA1C 5.8 (H) 12/15/2023   HGBA1C 5.8 (H) 08/18/2017   Lab Results  Component Value Date   INSULIN 5.7 12/15/2023   INSULIN 17.6 08/18/2017   Lab Results  Component Value Date   TSH 2.040 08/10/2022   CBC    Component Value Date/Time   WBC 5.3 09/13/2022 0925   WBC 10.6 (H) 12/29/2021 1432   RBC 4.82 09/13/2022 0925   RBC 4.70 12/29/2021 1432   HGB 15.7 09/13/2022 0925   HCT 46.7 09/13/2022 0925   PLT 177 09/13/2022 0925   MCV 97 09/13/2022 0925   MCH 32.6 09/13/2022 0925   MCH 33.0 12/29/2021 1432   MCHC 33.6 09/13/2022 0925   MCHC 35.1 12/29/2021 1432   RDW 13.0 09/13/2022 0925   Iron Studies No results found for: "IRON", "TIBC", "FERRITIN", "IRONPCTSAT" Lipid Panel     Component Value Date/Time   CHOL  135 12/15/2023 0910   TRIG 54 12/15/2023 0910   HDL 49 12/15/2023 0910   CHOLHDL 2.5 08/10/2022 1206   CHOLHDL 3 10/11/2019 1139   VLDL 17.4 10/11/2019 1139   LDLCALC 74 12/15/2023 0910   Hepatic Function Panel     Component Value Date/Time   PROT 6.6 12/15/2023 0910   ALBUMIN 4.0 12/15/2023 0910   AST 20 12/15/2023 0910   ALT 16 12/15/2023 0910   ALKPHOS 62 12/15/2023 0910   BILITOT 1.0 12/15/2023 0910   BILIDIR 0.1 02/11/2014 1016   IBILI 0.3 02/11/2014 1016      Component Value Date/Time   TSH 2.040 08/10/2022 1206   Nutritional Lab Results  Component Value Date   VD25OH 66.3 12/15/2023   VD25OH 60.5 05/25/2023   VD25OH 69.8  01/18/2023     Assessment and Plan    Obesity and Pre-diabetes Obesity with a history of prediabetes. Following a category three eating plan successfully 95% of the time, resulting in a three-pound weight loss over two months. Reports difficulty breaking through a weight plateau at 213-214 pounds. Weight loss is better than perceived due to water weight fluctuations. Active with yard work and woodworking but lacks formal exercise. Discussed benefits of core strengthening exercises to boost metabolism and prevent falls. Demonstrated 'sink stretch' exercise for core strengthening. - Continue category three eating plan - Incorporate core strengthening exercises to boost metabolism and prevent falls - Use 'sink stretch' exercise for core strengthening - Follow up in eight weeks  Sacral injury Compression injury many years ago Residual limitations in bending and low back stiffness but no significant pain. Previous physical therapy was beneficial. Discussed resuming previous physical therapy exercises, starting with ten repetitions and gradually increasing. - Resume previous physical therapy exercises, starting with ten repetitions and gradually increasing - Monitor for any exacerbation of back symptoms with new exercises   Follow-up - Schedule follow-up appointment in eight weeks.         I have personally spent 30 minutes total time today in preparation, patient care, and documentation for this visit, including the following: review of clinical lab tests; review of medical tests/procedures/services.    He was informed of the importance of frequent follow up visits to maximize his success with intensive lifestyle modifications for his multiple health conditions.    Quillian Quince, MD

## 2024-02-08 ENCOUNTER — Other Ambulatory Visit: Payer: Self-pay | Admitting: Family Medicine

## 2024-02-08 DIAGNOSIS — G4733 Obstructive sleep apnea (adult) (pediatric): Secondary | ICD-10-CM | POA: Diagnosis not present

## 2024-02-14 LAB — GENECONNECT MOLECULAR SCREEN: Genetic Analysis Overall Interpretation: NEGATIVE

## 2024-03-12 ENCOUNTER — Other Ambulatory Visit: Payer: Self-pay | Admitting: Family Medicine

## 2024-03-13 DIAGNOSIS — N200 Calculus of kidney: Secondary | ICD-10-CM | POA: Diagnosis not present

## 2024-03-13 DIAGNOSIS — N281 Cyst of kidney, acquired: Secondary | ICD-10-CM | POA: Diagnosis not present

## 2024-03-14 ENCOUNTER — Ambulatory Visit
Admission: EM | Admit: 2024-03-14 | Discharge: 2024-03-14 | Disposition: A | Discharge status: Home / Self Care | Attending: Physician Assistant | Admitting: Physician Assistant

## 2024-03-14 ENCOUNTER — Other Ambulatory Visit: Payer: Self-pay

## 2024-03-14 DIAGNOSIS — L03115 Cellulitis of right lower limb: Secondary | ICD-10-CM

## 2024-03-14 MED ORDER — DOXYCYCLINE HYCLATE 100 MG PO CAPS: 100.0000 mg | ORAL_CAPSULE | Freq: Two times a day (BID) | ORAL | 0 refills | Status: DC

## 2024-03-14 NOTE — ED Provider Notes (Signed)
 [name]    CSN: [id number] Arrival date & time: 03/14/24  5366      History   Chief Complaint Chief Complaint  Patient presents with   Insect Bite    HPI [name] is a 83 y.o. male.   Patient here today with wife for evaluation of redness, swelling, pain to his right anterior lower leg that started a few days ago.  He reports that he had been outside the day before symptoms began and thinks he might have been bitten by an insect.  He denies seeing an insect or feeling any pain that day.  He notes swelling is improved today compared to yesterday.  He has not had any fever and denies any nausea or vomiting.  He is a prediabetic.  He denies any numbness or tingling.  They have used Claritin and topical Neosporin at home.  The history is provided by the patient and the spouse.    Past Medical History:  Diagnosis Date   Atrial fibrillation (HCC)    BPH (benign prostatic hypertrophy)    Bradycardia 12/30/2014   HR routinely in mid 40s-50s   Coronary artery disease (CAD) excluded 03/2015   False-positive nuclear stress test suggesting inferior ischemia   Dysrhythmia    Elevated PSA    Encounter for Medicare annual wellness exam 09/22/2013   Sees Dr Karlyn Agee of Derm Sees Dr Stan Head of Gastroenterology Sees Dr Ihor Gully of [facility] Sees Dr Normand Sloop of Optometry   GERD (gastroesophageal reflux disease)    controlled w/ diet and behavioral changes, history of   Grade I diastolic dysfunction 2016   History of kidney stones    Hypertriglyceridemia 03/16/2015   Kidney stones    Lymphadenitis    Melanoma of back (HCC)    Excised Dr Karlyn Agee   Mild ascending aorta dilatation (HCC)    New onset a-fib (HCC) 03/10/2015   OA (osteoarthritis)    Otitis, externa, infective 08/10/2015   Palpitations    Pneumonia 2004   Post herpetic neuralgia    Shingles 07/09/2013   Sleep apnea 07/30/2011   cpap- 13    Snoring disorder 07/30/2011     Patient Active Problem List   Diagnosis Date Noted   Central adiposity 07/06/2023   BMI 34.0-34.9,adult 01/18/2023   Obesity, Beginning BMI 35.0 01/18/2023   Dehydration 09/13/2022   Prediabetes 09/13/2022   Elevated MCV 09/13/2022   Hypercoagulable state due to permanent atrial fibrillation (HCC) 02/09/2022   Kidney stone 01/28/2022   S/P reverse total shoulder arthroplasty, left 10/24/2020   Preop cardiovascular exam 10/10/2020   Bilateral hearing loss 09/11/2020   Arthritis of left glenohumeral joint 06/25/2020   Muscle cramps 10/14/2019   Hearing loss of right ear 10/14/2019   Tinnitus of right ear 10/14/2019   Bilateral inguinal herniae (BIH) s/p lap repair w mesh 06/01/2019 06/01/2019   Right Femoral hernia s/p lap repair w mesh 06/01/2019 06/01/2019   Groin pain, left 12/11/2018   Class 2 severe obesity with serious comorbidity and body mass index (BMI) of 35.0 to 35.9 in adult (HCC) 08/18/2017   Other fatigue 08/18/2017   Vitamin D deficiency 08/18/2017   Hyperglycemia 08/18/2017   Lymphedema 09/21/2015   Preventative health care 09/21/2015   Abnormal exercise myocardial perfusion study 04/02/2015   Permanent atrial fibrillation (HCC) 03/16/2015   Hypertriglyceridemia 03/16/2015   Bradycardia 12/30/2014   Melanoma of back (HCC)    Mass of chest wall, right subfacial, 12cm s/p  removal w rib resection 05/16/2014 03/08/2014   Mass of posterior neck, SQ 4cm 03/08/2014   Chest wall mass - right SQ anterior 5cm 08/21/2013   Post herpetic neuralgia 07/09/2013   OSA on CPAP 07/30/2011   ELEVATED PROSTATE SPECIFIC ANTIGEN 01/13/2010   LIPOMA, SKIN 08/29/2008   GERD 08/29/2008   BENIGN PROSTATIC HYPERTROPHY 08/29/2008    Past Surgical History:  Procedure Laterality Date   CATARACT EXTRACTION Right    COLONOSCOPY  2018   CYSTOSCOPY/URETEROSCOPY/HOLMIUM LASER/STENT PLACEMENT Left 10/05/2019   Procedure: CYSTOSCOPY/RETROGRADE/URETEROSCOPY/HOLMIUM LASER/STENT PLACEMENT;   Surgeon: Ihor Gully, MD;  Location: [facility];  Service: Urology;  Laterality: Left;   EXTRACORPOREAL SHOCK WAVE LITHOTRIPSY Left 01/25/2022   Procedure: EXTRACORPOREAL SHOCK WAVE LITHOTRIPSY (ESWL);  Surgeon: Heloise Purpura, MD;  Location: [facility];  Service: Urology;  Laterality: Left;   EYE SURGERY Left 12/06/2017   cataract by Dr Dyanne Carrel HERNIA REPAIR Bilateral 06/01/2019   Procedure: LAPAROSCOPIC LEFT AND  RIGHT INGUINAL HERNIA REPAIR WITH MESH;  Surgeon: Karie Soda, MD;  Location: [facility];  Service: General;  Laterality: Bilateral;   LEFT HEART CATHETERIZATION WITH CORONARY ANGIOGRAM N/A 04/10/2015   Procedure: LEFT HEART CATHETERIZATION WITH  CORONARY ANGIOGRAM;  Surgeon: Marykay Lex, MD;  Location: [facility] CATH LAB;  Service: Cardiovascular;  Angiographicallly NORMAL CORONARY ARTERIES   MASS EXCISION N/A 05/16/2014   Procedure: EXCISION POSTERIOR NECK MASS, RIGHT CHEST WALL MASS AND RIGHT AXILLARY MASS AXILLARY NODE DISSECTION;  Surgeon: Ardeth Sportsman, MD;  Location: [facility];  Service: General;  Laterality: N/A;   melanoma removal     MOUTH SURGERY N/A    NM MYOVIEW LTD  03/19/2015   FALSE POSITIVE:  INTERMEDIATE RISK. Small sized, moderate intensity inferior ischemic perfusion defect   REVERSE SHOULDER ARTHROPLASTY Left 10/16/2020   Procedure: REVERSE SHOULDER ARTHROPLASTY;  Surgeon: Francena Hanly, MD;  Location: [facility];  Service: Orthopedics;  Laterality: Left;    TRANSTHORACIC ECHOCARDIOGRAM  03/20/2015   Normal LV size and function. EF 60-65%. G1 DD. Trivial AI. Borderline aortic root dilation (41 mm), Mild LA dilation.   VEIN LIGATION AND STRIPPING Left 07/13/2021   Procedure: LIGATION AND STRIPPING OF LEFT GREAT SAPHENOUS VEIN WITH STAB PHLEBECTOMY GREATER THAN 20 INCISIONS TO LEFT LEG;  Surgeon: Chuck Hint, MD;  Location: [facility];  Service: Vascular;  Laterality: Left;       Home  Medications    Prior to Admission medications   Medication Sig Start Date End Date Taking? Authorizing Provider  doxycycline (VIBRAMYCIN) 100 MG capsule Take 1 capsule (100 mg total) by mouth 2 (two) times daily. 03/14/24  Yes Tomi Bamberger, PA-C  Cholecalciferol (VITAMIN D3) 125 MCG (5000 UT) CAPS Take 1 capsule (5,000 Units total) by mouth daily. Patient taking differently: Take 5,000 Units by mouth daily. 08/27/20   Quillian Quince D, MD  ELIQUIS 5 MG TABS tablet Take 1 tablet by mouth twice daily 12/30/23   Marykay Lex, MD  gabapentin (NEURONTIN) 300 MG capsule Take 3 capsules (900 mg total) by mouth 3 (three) times daily. 03/12/24   Bradd Canary, MD  Multiple Vitamins-Minerals (MULTIVITAMIN MEN 50+) TABS Take by mouth daily.    [provider]  pregabalin (LYRICA) 50 MG capsule Take 1 capsule (50 mg total) by mouth 3 (three) times daily. 01/04/24   Ranelle Oyster, MD  sildenafil (VIAGRA) 100 MG tablet Take 100 mg by mouth daily as needed for erectile dysfunction.  04/23/21   [provider]  tamsulosin (FLOMAX) 0.4 MG CAPS capsule Take 1 capsule (0.4 mg total) by mouth at bedtime. 01/25/22   Heloise Purpura, MD    Family History Family History  Problem Relation Age of Onset   Cancer Mother 38       MM, leukemia   Obesity Mother    Emphysema Father 22   Obesity Father    Alcohol abuse Father    Cancer Sister 55       brain   Diabetes Sister 60   Obesity Sister    COPD Brother 42   Down syndrome Brother 59       aspirated   Diabetes Son    Pancreatitis Son    Heart disease Paternal Grandmother    COPD Paternal Grandfather    Alcohol abuse Other    Colon cancer Neg Hx     Social History Social History   Tobacco Use   Smoking status: Former    Types: Pipe, Cigars    Quit date: 02/11/1989    Years since quitting: 35.1   Smokeless tobacco: Never  Vaping Use   Vaping status: Never Used  Substance Use Topics   Alcohol use: Yes    Alcohol/week: 7.0  standard drinks of alcohol    Types: 7 Glasses of wine per week    Comment: 1 per day -- brandy or wine   Drug use: No     Allergies   Patient has no known allergies.   Review of Systems Review of Systems  Constitutional:  Negative for chills and fever.  HENT:  Negative for facial swelling.   Eyes:  Negative for discharge and redness.  Respiratory:  Negative for shortness of breath.   Gastrointestinal:  Negative for nausea and vomiting.  Skin:  Positive for color change. Negative for wound.  Neurological:  Negative for numbness.     Physical Exam Triage Vital Signs ED Triage Vitals  Encounter Vitals Group     BP      Systolic BP Percentile      Diastolic BP Percentile      Pulse      Resp      Temp      Temp src      SpO2      Weight      Height      Head Circumference      Peak Flow      Pain Score      Pain Loc      Pain Education      Exclude from Growth Chart    No data found.  Updated Vital Signs BP 122/77 (BP Location: Right Arm)   Pulse 94   Temp (!) 97.5 F (36.4 C) (Oral)   Resp 16   Ht 5\' 9"  (1.753 m)   Wt 218 lb (98.9 kg)   SpO2 98% Comment: pt currently denies SOB.  BMI 32.19 kg/m   Visual Acuity Right Eye Distance:   Left Eye Distance:   Bilateral Distance:    Right Eye Near:   Left Eye Near:    Bilateral Near:     Physical Exam Vitals and nursing note reviewed.  Constitutional:      General: He is not in acute distress.    Appearance: Normal appearance. He is not ill-appearing.  HENT:     Head: Normocephalic and atraumatic.  Eyes:     Conjunctiva/sclera: Conjunctivae normal.  Cardiovascular:     Rate  and Rhythm: Normal rate.  Pulmonary:     Effort: Pulmonary effort is normal. No respiratory distress.  Skin:    Comments: Area of erythema and mild swelling appreciated to right anterior lower leg medial to midline.  No wound appreciated.  Neurological:     Mental Status: He is alert.  Psychiatric:        Mood and Affect:  Mood normal.        Behavior: Behavior normal.        Thought Content: Thought content normal.      UC Treatments / Results  Labs (all labs ordered are listed, but only abnormal results are displayed) Labs Reviewed - No data to display  EKG   Radiology No results found.  Procedures Procedures (including critical care time)  Medications Ordered in UC Medications - No data to display  Initial Impression / Assessment and Plan / UC Course  I have reviewed the triage vital signs and the nursing notes.  Pertinent labs & imaging results that were available during my care of the patient were reviewed by me and considered in my medical decision making (see chart for details).    Suspect likely beginning of cellulitis, will treat with doxycycline and advised continued monitoring with follow-up if symptoms do not improve with antibiotic therapy or with any worsening.  Patient and spouse expressed understanding.  Final Clinical Impressions(s) / UC Diagnoses   Final diagnoses:  Cellulitis of right lower leg   Discharge Instructions   None    ED Prescriptions     Medication Sig Dispense Auth. Provider   doxycycline (VIBRAMYCIN) 100 MG capsule Take 1 capsule (100 mg total) by mouth 2 (two) times daily. 20 capsule Tomi Bamberger, PA-C      PDMP not reviewed this encounter.   Tomi Bamberger, PA-C 03/14/24 [phone]

## 2024-03-14 NOTE — ED Triage Notes (Addendum)
 Pt presents with complaints of insect bite to right lower leg. Pt states he was working out in the yard on Friday 3/14, symptoms (redness + swelling) began that night. Pt currently rates his overall pain a 5/10, describes as a sharp sensation especially when walking or pressing on the area. OTC Claritin taken.

## 2024-03-21 ENCOUNTER — Encounter: Payer: Medicare Other | Attending: Physical Medicine & Rehabilitation | Admitting: Physical Medicine & Rehabilitation

## 2024-03-21 ENCOUNTER — Encounter: Payer: Self-pay | Admitting: Physical Medicine & Rehabilitation

## 2024-03-21 VITALS — BP 122/73 | HR 75 | Ht 69.0 in | Wt 224.0 lb

## 2024-03-21 DIAGNOSIS — B0229 Other postherpetic nervous system involvement: Secondary | ICD-10-CM | POA: Insufficient documentation

## 2024-03-21 MED ORDER — CAPSAICIN-CLEANSING GEL 8 % EX KIT
2.0000 | PACK | Freq: Once | CUTANEOUS | Status: AC
Start: 2024-03-21 — End: 2024-03-21
  Administered 2024-03-21: 2 via TOPICAL

## 2024-03-21 NOTE — Patient Instructions (Signed)
 ALWAYS FEEL FREE TO CALL OUR OFFICE WITH ANY PROBLEMS OR QUESTIONS [phone])  **PLEASE NOTE** ALL MEDICATION REFILL REQUESTS (INCLUDING CONTROLLED SUBSTANCES) NEED TO BE MADE AT LEAST 7 DAYS PRIOR TO REFILL BEING DUE. ANY REFILL REQUESTS INSIDE THAT TIME FRAME MAY RESULT IN DELAYS IN RECEIVING YOUR PRESCRIPTION.

## 2024-03-21 NOTE — Progress Notes (Signed)
 PROCEDURE: QUTENZA PATCH APPLICATION  DIAGNOSIS: Post-herpetic neuralgia   Description of Procedure:  Risk and benefits of patch application reviewed with patient. The patient was positioned on exam table to expose targeted area of pain. The area was marked and cleaned of any obvious dirt or debris. Then, two 8% capsaicin topical system patches were applied to the targeted area which was ~ the T3-4 dermatome from midline to right axilla. The patient's blood pressure and pulse were monitored  with vital sign checks every 15 minutes. After 60 minutes the patches were removed, and the area was then cleaned with a post-application cleansing gel. Then the area was washed with soap and water. The patient tolerated the procedure well. Follow up was arranged for 3 months with potential repeat application at that time. I asked patient to call prior to appointment to let us know if further patches need to be applied.    We will keep gabapentin 900mg  tid and lyrica only 50mg  bid for now as we see how he responds to patch.

## 2024-04-05 ENCOUNTER — Encounter (INDEPENDENT_AMBULATORY_CARE_PROVIDER_SITE_OTHER): Payer: Self-pay | Admitting: Family Medicine

## 2024-04-05 ENCOUNTER — Ambulatory Visit (INDEPENDENT_AMBULATORY_CARE_PROVIDER_SITE_OTHER): Payer: Medicare Other | Admitting: Family Medicine

## 2024-04-05 VITALS — BP 115/61 | HR 69 | Temp 97.7°F | Ht 68.5 in | Wt 212.0 lb

## 2024-04-05 DIAGNOSIS — Z6831 Body mass index (BMI) 31.0-31.9, adult: Secondary | ICD-10-CM

## 2024-04-05 DIAGNOSIS — R7303 Prediabetes: Secondary | ICD-10-CM

## 2024-04-05 DIAGNOSIS — E669 Obesity, unspecified: Secondary | ICD-10-CM

## 2024-04-05 DIAGNOSIS — Z0000 Encounter for general adult medical examination without abnormal findings: Secondary | ICD-10-CM | POA: Insufficient documentation

## 2024-04-05 NOTE — Progress Notes (Signed)
 Office: [phone]  /  Fax: [phone]  WEIGHT SUMMARY AND BIOMETRICS  Anthropometric Measurements Height: 5' 8.5" (1.74 m) Weight: 212 lb (96.2 kg) BMI (Calculated): 31.76 Weight at Last Visit: 213 lb Weight Lost Since Last Visit: 1 lb Weight Gained Since Last Visit: 0 Starting Weight: 237 lb Total Weight Loss (lbs): 25 lb (11.3 kg) Peak Weight: 270 lb   Body Composition  Body Fat %: 32.6 % Fat Mass (lbs): 69.2 lbs Muscle Mass (lbs): 136 lbs Total Body Water (lbs): 103.6 lbs Visceral Fat Rating : 21   Other Clinical Data Fasting: no Labs: no Today's Visit #: 58 Starting Date: 08/18/18    Chief Complaint: OBESITY   History of Present Illness [name] "[name]" is an 83 year old male who presents to discuss his obesity diagnosis and monitor his progress with his nutritional plan. His wife is with him today  He has been following his category three nutritional plan infrequently and is attempting to increase his physical activity. Since his last visit two months ago, he has lost one pound, totaling a 25-pound weight loss since starting the plan.  He has increased his physical activity to 30 minutes of cardio six times a week. He performs morning exercises, including those previously demonstrated to him, although he notes a decrease in the number of repetitions he can perform. He feels these exercises have improved his joint mobility, stating, 'I think it's kind of loosening them up.'  He and his partner consume a diet rich in chicken and fish, with minimal red meat intake, opting for lean cuts like [place] when he does. He inquires about the nutritional value of beans, questioning if they are a good protein source and how they compare to carbohydrates like bread and pasta.  He and his partner are current with their colonoscopies and vaccinations, having received letters indicating no further colonoscopies are needed due to his history. He plans to  discuss the latest COVID vaccine recommendations with his primary care provider in July.  He is involved in woodworking and cabinet making and engages in yard work to maintain activity. He plans to go on a cruise in October and visit [place] in August for his aunt's birthday.  No joint or back pain. He does not have difficulty rising after sitting but requires a few steps to regain full mobility.      PHYSICAL EXAM:  Blood pressure 115/61, pulse 69, temperature 97.7 F (36.5 C), height 5' 8.5" (1.74 m), weight 212 lb (96.2 kg), SpO2 90%. Body mass index is 31.77 kg/m.  DIAGNOSTIC DATA REVIEWED:  BMET    Component Value Date/Time   NA 142 12/15/2023 0910   K 4.6 12/15/2023 0910   CL 104 12/15/2023 0910   CO2 23 12/15/2023 0910   GLUCOSE 84 12/15/2023 0910   GLUCOSE 95 12/29/2021 1432   BUN 23 12/15/2023 0910   CREATININE 1.02 12/15/2023 0910   CREATININE 0.92 09/11/2020 0926   CALCIUM 9.4 12/15/2023 0910   GFRNONAA >60 12/29/2021 1432   GFRAA 82 12/03/2020 0930   Lab Results  Component Value Date   HGBA1C 5.8 (H) 12/15/2023   HGBA1C 5.8 (H) 08/18/2017   Lab Results  Component Value Date   INSULIN 5.7 12/15/2023   INSULIN 17.6 08/18/2017   Lab Results  Component Value Date   TSH 2.040 08/10/2022   CBC    Component Value Date/Time   WBC 5.3 09/13/2022 0925   WBC 10.6 (H) 12/29/2021 1432  RBC 4.82 09/13/2022 0925   RBC 4.70 12/29/2021 1432   HGB 15.7 09/13/2022 0925   HCT 46.7 09/13/2022 0925   PLT 177 09/13/2022 0925   MCV 97 09/13/2022 0925   MCH 32.6 09/13/2022 0925   MCH 33.0 12/29/2021 1432   MCHC 33.6 09/13/2022 0925   MCHC 35.1 12/29/2021 1432   RDW 13.0 09/13/2022 0925   Iron Studies No results found for: "IRON", "TIBC", "FERRITIN", "IRONPCTSAT" Lipid Panel     Component Value Date/Time   CHOL 135 12/15/2023 0910   TRIG 54 12/15/2023 0910   HDL 49 12/15/2023 0910   CHOLHDL 2.5 08/10/2022 1206   CHOLHDL 3 10/11/2019 1139   VLDL 17.4  10/11/2019 1139   LDLCALC 74 12/15/2023 0910   Hepatic Function Panel     Component Value Date/Time   PROT 6.6 12/15/2023 0910   ALBUMIN 4.0 12/15/2023 0910   AST 20 12/15/2023 0910   ALT 16 12/15/2023 0910   ALKPHOS 62 12/15/2023 0910   BILITOT 1.0 12/15/2023 0910   BILIDIR 0.1 02/11/2014 1016   IBILI 0.3 02/11/2014 1016      Component Value Date/Time   TSH 2.040 08/10/2022 1206   Nutritional Lab Results  Component Value Date   VD25OH 66.3 12/15/2023   VD25OH 60.5 05/25/2023   VD25OH 69.8 01/18/2023     Assessment and Plan Assessment & Plan Obesity He is diagnosed with obesity and follows a category three nutritional plan infrequently. He engages in 30 minutes of cardio six times a week, resulting in a one-pound weight loss in the last two months and a total of 25 pounds since starting the plan. Emphasized the importance of an active lifestyle and discussed dietary protein sources, including beans and lean meats, to support weight management. Suggested incorporating jerky or smoked salmon as snacks to meet protein goals. - Continue category three nutritional plan - Maintain physical activity of 30 minutes of cardio six times a week - Increase protein intake to 8-10 ounces per day, possibly by incorporating jerky or smoked salmon as snacks -Counseling done on various nutritional topics and all nutritional questions answered to their satisfaction  Prediabetes He is addressing prediabetes through diet and exercise to prevent diabetes onset. Discussed the importance of a balanced diet with adequate protein and fiber intake to manage blood glucose levels. Emphasized the role of fiber in beans as a beneficial carbohydrate compared to starchy carbs like bread and pasta. - Continue working on diet and exercise to improve prediabetes  General Health Maintenance He is current on colonoscopy and vaccinations. Mentioned the possibility of annual COVID vaccinations similar to flu shots  and advised discussing this with his primary care provider during his upcoming appointment. - Discuss COVID vaccination schedule with primary care provider in July  Follow-up Agreed on a follow-up in three months to monitor progress with the nutritional plan and overall health. - Schedule follow-up appointment in three months     I have personally spent 30 minutes total time today in preparation, patient care, counseling noted aboveand documentation for this visit   He was informed of the importance of frequent follow up visits to maximize his success with intensive lifestyle modifications for his multiple health conditions.    Quillian Quince, MD

## 2024-05-19 ENCOUNTER — Other Ambulatory Visit: Payer: Self-pay | Admitting: Family Medicine

## 2024-05-22 ENCOUNTER — Ambulatory Visit: Attending: Nurse Practitioner | Admitting: Nurse Practitioner

## 2024-05-22 ENCOUNTER — Encounter: Payer: Self-pay | Admitting: Nurse Practitioner

## 2024-05-22 VITALS — BP 136/78 | Ht 69.5 in | Wt 224.8 lb

## 2024-05-22 DIAGNOSIS — E781 Pure hyperglyceridemia: Secondary | ICD-10-CM

## 2024-05-22 DIAGNOSIS — R001 Bradycardia, unspecified: Secondary | ICD-10-CM | POA: Diagnosis not present

## 2024-05-22 DIAGNOSIS — I4821 Permanent atrial fibrillation: Secondary | ICD-10-CM

## 2024-05-22 DIAGNOSIS — G4733 Obstructive sleep apnea (adult) (pediatric): Secondary | ICD-10-CM | POA: Diagnosis not present

## 2024-05-22 DIAGNOSIS — I2510 Atherosclerotic heart disease of native coronary artery without angina pectoris: Secondary | ICD-10-CM | POA: Diagnosis not present

## 2024-05-22 NOTE — Progress Notes (Unsigned)
 Office Visit    Patient Name: [name] [name] Date of Encounter: 05/22/2024  Primary Care Provider:  Neda Balk, MD Primary Cardiologist:  Randene Bustard, MD  Chief Complaint    83 year old male with a history of mild nonobstructive CAD, permanent atrial fibrillation, bradycardia, hypertriglyceridemia, OSA, BPH, osteoarthritis, and GERD who presents for follow-up related to atrial fibrillation.  Past Medical History    Past Medical History:  Diagnosis Date   Atrial fibrillation (HCC)    BPH (benign prostatic hypertrophy)    Bradycardia 12/30/2014   HR routinely in mid 40s-50s   Coronary artery disease (CAD) excluded 03/2015   False-positive nuclear stress test suggesting inferior ischemia   Dysrhythmia    Elevated PSA    Encounter for Medicare annual wellness exam 09/22/2013   Sees Dr Marcia Setters of Derm Sees Dr Loy Ruff of Gastroenterology Sees Dr Trudee Furth of [facility] Sees Dr Townsend Freud of Optometry   GERD (gastroesophageal reflux disease)    controlled w/ diet and behavioral changes, history of   Grade I diastolic dysfunction 2016   History of kidney stones    Hypertriglyceridemia 03/16/2015   Kidney stones    Lymphadenitis    Melanoma of back (HCC)    Excised Dr Marcia Setters   Mild ascending aorta dilatation (HCC)    New onset a-fib (HCC) 03/10/2015   OA (osteoarthritis)    Otitis, externa, infective 08/10/2015   Palpitations    Pneumonia 2004   Post herpetic neuralgia    Shingles 07/09/2013   Sleep apnea 07/30/2011   cpap- 13    Snoring disorder 07/30/2011   Past Surgical History:  Procedure Laterality Date   CATARACT EXTRACTION Right    COLONOSCOPY  2018   CYSTOSCOPY/URETEROSCOPY/HOLMIUM LASER/STENT PLACEMENT Left 10/05/2019   Procedure: CYSTOSCOPY/RETROGRADE/URETEROSCOPY/HOLMIUM LASER/STENT PLACEMENT;  Surgeon: Ottelin, Mark, MD;  Location: [facility];  Service: Urology;  Laterality: Left;   EXTRACORPOREAL SHOCK  WAVE LITHOTRIPSY Left 01/25/2022   Procedure: EXTRACORPOREAL SHOCK WAVE LITHOTRIPSY (ESWL);  Surgeon: Florencio Hunting, MD;  Location: [facility];  Service: Urology;  Laterality: Left;   EYE SURGERY Left 12/06/2017   cataract by Dr Hulda Mage HERNIA REPAIR Bilateral 06/01/2019   Procedure: LAPAROSCOPIC LEFT AND  RIGHT INGUINAL HERNIA REPAIR WITH MESH;  Surgeon: Candyce Champagne, MD;  Location: [facility];  Service: General;  Laterality: Bilateral;   LEFT HEART CATHETERIZATION WITH CORONARY ANGIOGRAM N/A 04/10/2015   Procedure: LEFT HEART CATHETERIZATION WITH  CORONARY ANGIOGRAM;  Surgeon: Arleen Lacer, MD;  Location: [facility] CATH LAB;  Service: Cardiovascular;  Angiographicallly NORMAL CORONARY ARTERIES   MASS EXCISION N/A 05/16/2014   Procedure: EXCISION POSTERIOR NECK MASS, RIGHT CHEST WALL MASS AND RIGHT AXILLARY MASS AXILLARY NODE DISSECTION;  Surgeon: Eddye Goodie, MD;  Location: [facility];  Service: General;  Laterality: N/A;   melanoma removal     MOUTH SURGERY N/A    NM MYOVIEW  LTD  03/19/2015   FALSE POSITIVE:  INTERMEDIATE RISK. Small sized, moderate intensity inferior ischemic perfusion defect   REVERSE SHOULDER ARTHROPLASTY Left 10/16/2020   Procedure: REVERSE SHOULDER ARTHROPLASTY;  Surgeon: Ellard Gunning, MD;  Location: [facility];  Service: Orthopedics;  Laterality: Left;    TRANSTHORACIC ECHOCARDIOGRAM  03/20/2015   Normal LV size and function. EF 60-65%. G1 DD. Trivial AI. Borderline aortic root dilation (41 mm), Mild LA dilation.   VEIN LIGATION AND STRIPPING Left 07/13/2021   Procedure: LIGATION AND STRIPPING OF LEFT GREAT SAPHENOUS  VEIN WITH STAB PHLEBECTOMY GREATER THAN 20 INCISIONS TO LEFT LEG;  Surgeon: Dannis Dy, MD;  Location: [facility] OR;  Service: Vascular;  Laterality: Left;    Allergies  No Known Allergies   Labs/Other Studies Reviewed    The following studies were reviewed today:  Cardiac Studies & Procedures    ______________________________________________________________________________________________              ______________________________________________________________________________________________     Recent Labs: 12/15/2023: ALT 16; BUN 23; Creatinine, Ser 1.02; Potassium 4.6; Sodium 142  Recent Lipid Panel    Component Value Date/Time   CHOL 135 12/15/2023 0910   TRIG 54 12/15/2023 0910   HDL 49 12/15/2023 0910   CHOLHDL 2.5 08/10/2022 1206   CHOLHDL 3 10/11/2019 1139   VLDL 17.4 10/11/2019 1139   LDLCALC 74 12/15/2023 0910    History of Present Illness    83 year old male with the above past medical history including mild nonobstructive CAD, permanent atrial fibrillation, bradycardia, hypertriglyceridemia, OSA, BPH, osteoarthritis, and GERD.  He has a history of prior bradycardia.  He was diagnosed with atrial fibrillation in 2016, now rate controlled, on chronic DOAC therapy.  Myoview  in 2016 was abnormal.  Echocardiogram showed EF 60 to 65%, normal LV function, G1 DD, no significant valvular abnormalities, mild dilation of the aortic root measuring 41 mm.  Follow-up cardiac catheterization in 03/2015 revealed mild nonobstructive CAD.  He was last seen in the office on 04/13/2023 was stable from a cardiac standpoint.  He denied symptoms concerning for angina, denied palpitations.  He presents today for follow-up accompanied by his wife and daughter.  Since his last visit Done well from a cardiac standpoint.  He denies symptoms concerning for angina, he denies palpitations, dizziness, dyspnea, edema, PND, with apnea, weight gain.  He remains active, he works in his shop, doing Event organiser.  Overall, he reports feeling well.  He has his physical scheduled with his PCP in 06/2024, will defer repeat labs (CBC, BMET until that time).  He will also likely be due for repeat lipid panel.  BP is mildly elevated in office today, generally well-controlled.  Continue to monitor BP and  report BP consistent greater than 130/80.  Follow-up in 1 year with Dr. Addie Holstein, sooner if needed.  Nonobstructive CAD:  Cardiac catheterization in 03/2015 revealed mild nonobstructive CAD. Stable with no anginal symptoms. No indication for ischemic evaluation.  Permanent atrial fibrillation/history of bradycardia: Rate controlled. Will u Hypertriglyceridemia: Lipid panel in 11/2023 showed total cholesterol 135, triglycerides 54, LDL 74. OSA: Adherent to CPAP.  Disposition: Follow-up in   Home Medications    Current Outpatient Medications  Medication Sig Dispense Refill   Cholecalciferol (VITAMIN D3) 125 MCG (5000 UT) CAPS Take 1 capsule (5,000 Units total) by mouth daily. (Patient taking differently: Take 5,000 Units by mouth daily.) 30 capsule 0   ELIQUIS  5 MG TABS tablet Take 1 tablet by mouth twice daily 180 tablet 1   gabapentin  (NEURONTIN ) 300 MG capsule TAKE 3 CAPSULES BY MOUTH THREE TIMES DAILY 270 capsule 2   Multiple Vitamins-Minerals (MULTIVITAMIN MEN 50+) TABS Take by mouth daily.     sildenafil (VIAGRA) 100 MG tablet Take 100 mg by mouth daily as needed for erectile dysfunction.     tamsulosin  (FLOMAX ) 0.4 MG CAPS capsule Take 1 capsule (0.4 mg total) by mouth at bedtime. 14 capsule 0   No current facility-administered medications for this visit.     Review of Systems    He denies chest pain, palpitations,  dyspnea, pnd, orthopnea, n, v, dizziness, syncope, edema, weight gain, or early satiety. All other systems reviewed and are otherwise negative except as noted above.   Physical Exam    VS:  There were no vitals taken for this visit. , BMI There is no height or weight on file to calculate BMI.     GEN: Well nourished, well developed, in no acute distress. HEENT: normal. Neck: Supple, no JVD, carotid bruits, or masses. Cardiac: RRR, no murmurs, rubs, or gallops. No clubbing, cyanosis, edema.  Radials/DP/PT 2+ and equal bilaterally.  Respiratory:  Respirations regular and  unlabored, clear to auscultation bilaterally. GI: Soft, nontender, nondistended, BS + x 4. MS: no deformity or atrophy. Skin: warm and dry, no rash. Neuro:  Strength and sensation are intact. Psych: Normal affect.  Accessory Clinical Findings    ECG personally reviewed by me today - EKG Interpretation Date/Time:  Tuesday May 22 2024 15:39:01 EDT Ventricular Rate:  79 PR Interval:    QRS Duration:  80 QT Interval:  388 QTC Calculation: 444 R Axis:   -29  Text Interpretation: Atrial fibrillation Low voltage QRS Cannot rule out Anterior infarct , age undetermined No significant change since last tracing Confirmed by Marlana Silvan ([id number]) on 05/22/2024 3:40:29 PM  - no acute changes.   Lab Results  Component Value Date   WBC 5.3 09/13/2022   HGB 15.7 09/13/2022   HCT 46.7 09/13/2022   MCV 97 09/13/2022   PLT 177 09/13/2022   Lab Results  Component Value Date   CREATININE 1.02 12/15/2023   BUN 23 12/15/2023   NA 142 12/15/2023   K 4.6 12/15/2023   CL 104 12/15/2023   CO2 23 12/15/2023   Lab Results  Component Value Date   ALT 16 12/15/2023   AST 20 12/15/2023   ALKPHOS 62 12/15/2023   BILITOT 1.0 12/15/2023   Lab Results  Component Value Date   CHOL 135 12/15/2023   HDL 49 12/15/2023   LDLCALC 74 12/15/2023   TRIG 54 12/15/2023   CHOLHDL 2.5 08/10/2022    Lab Results  Component Value Date   HGBA1C 5.8 (H) 12/15/2023    Assessment & Plan    1.  ***  No BP recorded.  {Refresh Note OR Click here to enter BP  :1}***   Jude Norton, NP 05/22/2024, 3:40 PM

## 2024-05-22 NOTE — Patient Instructions (Signed)
 Medication Instructions:  Your physician recommends that you continue on your current medications as directed. Please refer to the Current Medication list given to you today.  *If you need a refill on your cardiac medications before your next appointment, please call your pharmacy*  Lab Work: NONE ordered at this time of appointment   Testing/Procedures: NONE ordered at this time of appointment   Follow-Up: At [facility], you and your health needs are our priority.  As part of our continuing mission to provide you with exceptional heart care, our providers are all part of one team.  This team includes your primary Cardiologist (physician) and Advanced Practice Providers or APPs (Physician Assistants and Nurse Practitioners) who all work together to provide you with the care you need, when you need it.  Your next appointment:   1 year(s)  Provider:   Randene Bustard, MD    We recommend signing up for the patient portal called "MyChart".  Sign up information is provided on this After Visit Summary.  MyChart is used to connect with patients for Virtual Visits (Telemedicine).  Patients are able to view lab/test results, encounter notes, upcoming appointments, etc.  Non-urgent messages can be sent to your provider as well.   To learn more about what you can do with MyChart, go to [email or web address].   Other Instructions Monitor blood pressure. Report blood pressure consistently greater than 130/80

## 2024-05-24 ENCOUNTER — Encounter: Payer: Self-pay | Admitting: Nurse Practitioner

## 2024-06-20 ENCOUNTER — Encounter: Admitting: Physical Medicine & Rehabilitation

## 2024-06-25 ENCOUNTER — Other Ambulatory Visit: Payer: Self-pay | Admitting: Cardiology

## 2024-06-25 DIAGNOSIS — I4821 Permanent atrial fibrillation: Secondary | ICD-10-CM

## 2024-06-25 NOTE — Telephone Encounter (Signed)
 Prescription refill request for Eliquis  received. Indication: AF Last office visit: 05/22/24  E Monge NP Scr: 1.02 on 12/15/23  Epic Age: 83 Weight: 102kg  Based on above findings Eliquis  5mg  twice daily is the appropriate dose.  Refill approved.

## 2024-07-05 ENCOUNTER — Ambulatory Visit (INDEPENDENT_AMBULATORY_CARE_PROVIDER_SITE_OTHER): Admitting: Family Medicine

## 2024-07-05 ENCOUNTER — Encounter (INDEPENDENT_AMBULATORY_CARE_PROVIDER_SITE_OTHER): Payer: Self-pay | Admitting: Family Medicine

## 2024-07-05 VITALS — BP 120/83 | HR 58 | Temp 97.6°F | Ht 68.5 in | Wt 212.0 lb

## 2024-07-05 DIAGNOSIS — E559 Vitamin D deficiency, unspecified: Secondary | ICD-10-CM

## 2024-07-05 DIAGNOSIS — Z6831 Body mass index (BMI) 31.0-31.9, adult: Secondary | ICD-10-CM

## 2024-07-05 DIAGNOSIS — E669 Obesity, unspecified: Secondary | ICD-10-CM

## 2024-07-05 DIAGNOSIS — E66812 Obesity, class 2: Secondary | ICD-10-CM

## 2024-07-05 DIAGNOSIS — R7303 Prediabetes: Secondary | ICD-10-CM | POA: Diagnosis not present

## 2024-07-05 DIAGNOSIS — I4891 Unspecified atrial fibrillation: Secondary | ICD-10-CM | POA: Diagnosis not present

## 2024-07-05 NOTE — Progress Notes (Signed)
 Office: [phone]  /  Fax: [phone]  WEIGHT SUMMARY AND BIOMETRICS  Anthropometric Measurements Height: 5' 8.5 (1.74 m) Weight: 212 lb (96.2 kg) BMI (Calculated): 31.76 Weight at Last Visit: 212 lb Weight Lost Since Last Visit: 0 Weight Gained Since Last Visit: 0 Starting Weight: 237 lb Total Weight Loss (lbs): 275 lb (124.7 kg) Peak Weight: 25 lb   Body Composition  Body Fat %: 32.9 % Fat Mass (lbs): 70 lbs Muscle Mass (lbs): 135.8 lbs Total Body Water  (lbs): 104.4 lbs Visceral Fat Rating : 21   Other Clinical Data Fasting: yes Labs: no Today's Visit #: 22 Starting Date: 08/18/18    Chief Complaint: OBESITY   History of Present Illness [name] [name] is an 83 year old male with obesity and atrial fibrillation who presents for obesity treatment and progress assessment.  He is following a category three eating plan, adhering to it about fifty percent of the time, and remains active with yard work and woodworking most days of the week. Over the last three months, he has maintained his weight. No issues with hunger, and he is conscious of his food choices even when eating out and during social gatherings.  He has a history of prediabetes and is working on improving this condition through diet, exercise, and weight loss. He is not currently taking metformin.  He has a history of atrial fibrillation and is under the care of a cardiologist. He is on Eliquis  5 mg twice daily. He reports that his cardiology visits are routine and that none of his doctors have made any changes to his medications or management.  He and his wife have planned trips for the rest of the year, including a family visit to [place]  and [place]  in August and a cruise from [place] to [address]. He also plans a future trip from [place] to [place], [place]. 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      PHYSICAL EXAM:  Blood pressure 120/83, pulse (!) 58, temperature 97.6 F (36.4 C), height 5' 8.5 (1.74 m), weight 212 lb (96.2 kg), SpO2 96%. Body mass index is 31.77 kg/m.  DIAGNOSTIC DATA REVIEWED:  BMET    Component Value Date/Time   NA 142 12/15/2023 0910   K 4.6 12/15/2023 0910   CL 104 12/15/2023 0910   CO2 23 12/15/2023 0910   GLUCOSE 84 12/15/2023 0910   GLUCOSE 95 12/29/2021 1432   BUN 23 12/15/2023 0910   CREATININE 1.02 12/15/2023 0910   CREATININE 0.92 09/11/2020 0926   CALCIUM 9.4 12/15/2023 0910   GFRNONAA >60 12/29/2021 1432   GFRAA 82 12/03/2020 0930   Lab Results  Component Value Date   HGBA1C 5.8 (H) 12/15/2023   HGBA1C 5.8 (H) 08/18/2017   Lab Results  Component Value Date   INSULIN  5.7 12/15/2023   INSULIN  17.6 08/18/2017   Lab Results  Component Value Date   TSH 2.040 08/10/2022   CBC    Component Value Date/Time   WBC 5.3 09/13/2022 0925   WBC 10.6 (H) 12/29/2021 1432   RBC 4.82 09/13/2022 0925   RBC 4.70 12/29/2021 1432   HGB 15.7 09/13/2022 0925   HCT 46.7 09/13/2022 0925   PLT 177 09/13/2022 0925   MCV 97 09/13/2022 0925   MCH 32.6 09/13/2022 0925   MCH 33.0 12/29/2021 1432   MCHC 33.6 09/13/2022 0925   MCHC 35.1 12/29/2021  1432   RDW 13.0 09/13/2022 0925   Iron Studies No results found for: IRON, TIBC, FERRITIN, IRONPCTSAT Lipid Panel     Component Value Date/Time   CHOL 135 12/15/2023 0910   TRIG 54 12/15/2023 0910   HDL 49 12/15/2023 0910   CHOLHDL 2.5 08/10/2022 1206   CHOLHDL 3 10/11/2019 1139   VLDL 17.4 10/11/2019 1139   LDLCALC 74 12/15/2023 0910   Hepatic Function Panel     Component Value Date/Time   PROT 6.6 12/15/2023 0910   ALBUMIN 4.0 12/15/2023 0910   AST 20 12/15/2023 0910   ALT 16 12/15/2023 0910   ALKPHOS 62 12/15/2023 0910   BILITOT 1.0 12/15/2023 0910   BILIDIR 0.1 02/11/2014 1016   IBILI 0.3 02/11/2014 1016      Component Value Date/Time   TSH 2.040  08/10/2022 1206   Nutritional Lab Results  Component Value Date   VD25OH 66.3 12/15/2023   VD25OH 60.5 05/25/2023   VD25OH 69.8 01/18/2023     Assessment and Plan Assessment & Plan Atrial Fibrillation He is in atrial fibrillation with a well-controlled heart rate of 58 bpm, slightly lower than his usual 60s. He is on Eliquis  5 mg twice daily and is under cardiology care. Emphasis is on weight management and lifestyle modifications to reduce exacerbations. - Continue Eliquis  5 mg twice daily - Follow up with cardiology as needed - Continue category three eating plan - Encourage regular physical activity, including yard work and woodworking  Obesity He follows a category three eating plan approximately 50% of the time and has maintained his weight over the last three months. 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 The focus is on maintaining muscle mass and ensuring adequate protein intake. His BMI is 31, considered optimal for someone over 65. The goal is to maintain current health status rather than focus on further weight loss. He is advised against weight loss medications, emphasizing lifestyle changes for sustainable health benefits. - Continue category three eating plan - Encourage regular physical activity, including yard work and woodworking - Ensure adequate protein intake to maintain muscle mass - Consider chair yoga for muscle strengthening without joint impact - Reassess weight and health status in three months  Prediabetes He manages prediabetes with diet, exercise, and weight loss, without metformin. The focus is on lifestyle modifications to prevent progression to diabetes, emphasizing a balanced diet and regular physical activity to manage blood glucose levels. - Continue diet and exercise regimen to manage prediabetes  Vitamin D  Management He is on 2000 IU of vitamin D  daily after previously taking 5000 IU three times a week due to elevated levels. He is advised  to check vitamin D  levels during his upcoming physical. - Request vitamin D  level check during upcoming physical with Dr. Carleton, will enter order today    He was informed of the importance of frequent follow up visits to maximize his success with intensive lifestyle modifications for his multiple health conditions.    Louann Penton, MD

## 2024-07-09 NOTE — Assessment & Plan Note (Signed)
 Supplement and monitor

## 2024-07-09 NOTE — Assessment & Plan Note (Signed)
 hgba1c acceptable, minimize simple carbs. Increase exercise as tolerated.

## 2024-07-09 NOTE — Assessment & Plan Note (Signed)
 Encourage heart healthy diet such as MIND or DASH diet, increase exercise, avoid trans fats, simple carbohydrates and processed foods, consider a krill or fish or flaxseed oil cap daily.

## 2024-07-09 NOTE — Assessment & Plan Note (Signed)
 Following with healthy weight and wellness Needs 7-8 hours of sleep nightly. Avoid trans fats, eat small, frequent meals every 4-5 hours with lean proteins, complex carbs and healthy fats. Minimize simple carbs, high fat foods and processed foods.

## 2024-07-09 NOTE — Assessment & Plan Note (Signed)
Avoid offending foods, start probiotics. Do not eat large meals in late evening and consider raising head of bed.  

## 2024-07-12 ENCOUNTER — Ambulatory Visit (INDEPENDENT_AMBULATORY_CARE_PROVIDER_SITE_OTHER): Payer: Medicare Other | Admitting: Family Medicine

## 2024-07-12 ENCOUNTER — Encounter: Payer: Self-pay | Admitting: Family Medicine

## 2024-07-12 ENCOUNTER — Ambulatory Visit: Payer: Self-pay | Admitting: Family Medicine

## 2024-07-12 VITALS — BP 118/80 | HR 70 | Resp 16 | Ht 68.5 in | Wt 222.2 lb

## 2024-07-12 DIAGNOSIS — E66812 Obesity, class 2: Secondary | ICD-10-CM | POA: Diagnosis not present

## 2024-07-12 DIAGNOSIS — E559 Vitamin D deficiency, unspecified: Secondary | ICD-10-CM | POA: Diagnosis not present

## 2024-07-12 DIAGNOSIS — Z6835 Body mass index (BMI) 35.0-35.9, adult: Secondary | ICD-10-CM | POA: Diagnosis not present

## 2024-07-12 DIAGNOSIS — R739 Hyperglycemia, unspecified: Secondary | ICD-10-CM

## 2024-07-12 DIAGNOSIS — E781 Pure hyperglyceridemia: Secondary | ICD-10-CM | POA: Diagnosis not present

## 2024-07-12 DIAGNOSIS — K219 Gastro-esophageal reflux disease without esophagitis: Secondary | ICD-10-CM

## 2024-07-12 LAB — COMPLETE METABOLIC PANEL WITHOUT GFR
AG Ratio: 1.7 (calc) (ref 1.0–2.5)
ALT: 16 U/L (ref 9–46)
AST: 17 U/L (ref 10–35)
Albumin: 4 g/dL (ref 3.6–5.1)
Alkaline phosphatase (APISO): 49 U/L (ref 35–144)
BUN/Creatinine Ratio: 34 (calc) — ABNORMAL HIGH (ref 6–22)
BUN: 32 mg/dL — ABNORMAL HIGH (ref 7–25)
CO2: 27 mmol/L (ref 20–32)
Calcium: 9.2 mg/dL (ref 8.6–10.3)
Chloride: 107 mmol/L (ref 98–110)
Creat: 0.95 mg/dL (ref 0.70–1.22)
Globulin: 2.3 g/dL (ref 1.9–3.7)
Glucose, Bld: 89 mg/dL (ref 65–99)
Potassium: 4.5 mmol/L (ref 3.5–5.3)
Sodium: 140 mmol/L (ref 135–146)
Total Bilirubin: 0.7 mg/dL (ref 0.2–1.2)
Total Protein: 6.3 g/dL (ref 6.1–8.1)

## 2024-07-12 LAB — LIPID PANEL
Cholesterol: 148 mg/dL (ref <200)
HDL: 52 mg/dL (ref >=40)
LDL Cholesterol (Calc): 78 mg/dL
Non-HDL Cholesterol (Calc): 96 mg/dL (ref <130)
Total CHOL/HDL Ratio: 2.8 (calc) (ref <5.0)
Triglycerides: 95 mg/dL (ref <150)

## 2024-07-12 LAB — CBC WITH DIFFERENTIAL/PLATELET
Basophils Absolute: 0 K/uL (ref 0.0–0.1)
Basophils Relative: 0.4 % (ref 0.0–3.0)
Eosinophils Absolute: 0.1 K/uL (ref 0.0–0.7)
Eosinophils Relative: 1.4 % (ref 0.0–5.0)
HCT: 44.7 % (ref 39.0–52.0)
Hemoglobin: 14.9 g/dL (ref 13.0–17.0)
Lymphocytes Relative: 19.3 % (ref 12.0–46.0)
Lymphs Abs: 1.1 K/uL (ref 0.7–4.0)
MCHC: 33.3 g/dL (ref 30.0–36.0)
MCV: 97.3 fl (ref 78.0–100.0)
Monocytes Absolute: 0.5 K/uL (ref 0.1–1.0)
Monocytes Relative: 9.1 % (ref 3.0–12.0)
Neutro Abs: 3.9 K/uL (ref 1.4–7.7)
Neutrophils Relative %: 69.8 % (ref 43.0–77.0)
Platelets: 174 K/uL (ref 150.0–400.0)
RBC: 4.6 Mil/uL (ref 4.22–5.81)
RDW: 14.5 % (ref 11.5–15.5)
WBC: 5.6 K/uL (ref 4.0–10.5)

## 2024-07-12 LAB — VITAMIN D 25 HYDROXY (VIT D DEFICIENCY, FRACTURES): VITD: 61.76 ng/mL (ref 30.00–100.00)

## 2024-07-12 LAB — HEMOGLOBIN A1C: Hgb A1c MFr Bld: 6 % (ref 4.6–6.5)

## 2024-07-12 LAB — TSH: TSH: 1.58 u[IU]/mL (ref 0.35–5.50)

## 2024-07-12 NOTE — Progress Notes (Signed)
 Subjective:    Patient ID: [name] [name], male    DOB: 02-07-1941, 83 y.o.   MRN: [id number]  Chief Complaint  Patient presents with   Medical Management of Chronic Issues    HPI Discussed the use of AI scribe software for clinical note transcription with the patient, who gave verbal consent to proceed.  History of Present Illness [name] [name] is an 83 year old male with postherpetic neuralgia who presents for management of chronic pain.  He experiences chronic pain due to postherpetic neuralgia and has been using gabapentin  for pain management, taking three pills in the morning, three at lunch, and three at dinner. Despite long-term use, gabapentin  is the only medication that has provided relief for his nerve pain.  He has tried various treatments for his postherpetic neuralgia, including capsaicin , but found it intolerable due to the intense pain they caused. He also uses hemp freeze, which provides temporary relief, and has experimented with Biofreeze for additional pain management.  His pain is variable; it can disappear when he is active, such as working in the yard or shop, but returns when he relaxes. He sometimes forgets to take his gabapentin  at lunchtime, which results in increased pain by the afternoon.  He is mindful of his hydration, especially in the heat, and has been trying to improve his water  intake. He notices dry mouth and thirst in the evenings if he hasn't consumed enough water  during the day.  Bowel movements are regular.    Past Medical History:  Diagnosis Date   Atrial fibrillation (HCC)    BPH (benign prostatic hypertrophy)    Bradycardia 12/30/2014   HR routinely in mid 40s-50s   Coronary artery disease (CAD) excluded 03/2015   False-positive nuclear stress test suggesting inferior ischemia   Dysrhythmia    Elevated PSA    Encounter for Medicare annual wellness exam 09/22/2013   Sees Dr Rolan Molt of Derm Sees Dr Lupita Commander of  Gastroenterology Sees Dr Oneil Rafter of [facility] Sees Dr Randon Bile of Optometry   GERD (gastroesophageal reflux disease)    controlled w/ diet and behavioral changes, history of   Grade I diastolic dysfunction 2016   History of kidney stones    Hypertriglyceridemia 03/16/2015   Kidney stones    Lymphadenitis    Melanoma of back (HCC)    Excised Dr Rolan Molt   Mild ascending aorta dilatation (HCC)    New onset a-fib (HCC) 03/10/2015   OA (osteoarthritis)    Otitis, externa, infective 08/10/2015   Palpitations    Pneumonia 2004   Post herpetic neuralgia    Shingles 07/09/2013   Sleep apnea 07/30/2011   cpap- 13    Snoring disorder 07/30/2011    Past Surgical History:  Procedure Laterality Date   CATARACT EXTRACTION Right    COLONOSCOPY  2018   CYSTOSCOPY/URETEROSCOPY/HOLMIUM LASER/STENT PLACEMENT Left 10/05/2019   Procedure: CYSTOSCOPY/RETROGRADE/URETEROSCOPY/HOLMIUM LASER/STENT PLACEMENT;  Surgeon: Ottelin, Mark, MD;  Location: [facility] [place];  Service: Urology;  Laterality: Left;   EXTRACORPOREAL SHOCK WAVE LITHOTRIPSY Left 01/25/2022   Procedure: EXTRACORPOREAL SHOCK WAVE LITHOTRIPSY (ESWL);  Surgeon: Renda Glance, MD;  Location: [facility];  Service: Urology;  Laterality: Left;   EYE SURGERY Left 12/06/2017   cataract by Dr Ruth FONTANA HERNIA REPAIR Bilateral 06/01/2019   Procedure: LAPAROSCOPIC LEFT AND  RIGHT INGUINAL HERNIA REPAIR WITH MESH;  Surgeon: Sheldon Standing, MD;  Location: [facility] [place];  Service: General;  Laterality: Bilateral;   LEFT HEART CATHETERIZATION WITH CORONARY ANGIOGRAM N/A 04/10/2015   Procedure: LEFT HEART CATHETERIZATION WITH  CORONARY ANGIOGRAM;  Surgeon: Alm LELON Clay, MD;  Location: [facility] CATH LAB;  Service: Cardiovascular;  Angiographicallly NORMAL CORONARY ARTERIES   MASS EXCISION N/A 05/16/2014   Procedure: EXCISION POSTERIOR NECK MASS, RIGHT CHEST WALL MASS AND RIGHT AXILLARY MASS  AXILLARY NODE DISSECTION;  Surgeon: Elspeth KYM Schultze, MD;  Location: [facility];  Service: General;  Laterality: N/A;   melanoma removal     MOUTH SURGERY N/A    NM MYOVIEW  LTD  03/19/2015   FALSE POSITIVE:  INTERMEDIATE RISK. Small sized, moderate intensity inferior ischemic perfusion defect   REVERSE SHOULDER ARTHROPLASTY Left 10/16/2020   Procedure: REVERSE SHOULDER ARTHROPLASTY;  Surgeon: Melita Drivers, MD;  Location: [facility];  Service: Orthopedics;  Laterality: Left;    TRANSTHORACIC ECHOCARDIOGRAM  03/20/2015   Normal LV size and function. EF 60-65%. G1 DD. Trivial AI. Borderline aortic root dilation (41 mm), Mild LA dilation.   VEIN LIGATION AND STRIPPING Left 07/13/2021   Procedure: LIGATION AND STRIPPING OF LEFT GREAT SAPHENOUS VEIN WITH STAB PHLEBECTOMY GREATER THAN 20 INCISIONS TO LEFT LEG;  Surgeon: Eliza Lonni RAMAN, MD;  Location: [facility];  Service: Vascular;  Laterality: Left;    Family History  Problem Relation Age of Onset   Cancer Mother 88       MM, leukemia   Obesity Mother    Emphysema Father 107   Obesity Father    Alcohol  abuse Father    Cancer Sister 38       brain   Diabetes Sister 45   Obesity Sister    COPD Brother 21   Down syndrome Brother 51       aspirated   Diabetes Son    Pancreatitis Son    Heart disease Paternal Grandmother    COPD Paternal Grandfather    Alcohol  abuse Other    Colon cancer Neg Hx     Social History   Socioeconomic History   Marital status: Married    Spouse name: [name]   Number of children: 2   Years of education: Not on file   Highest education level: Associate degree: occupational, Scientist, product/process development, or vocational program  Occupational History   Occupation: retired    Comment: truck driver  Tobacco Use   Smoking status: Former    Types: Pipe, Software engineer    Quit date: 02/11/1989    Years since quitting: 35.4   Smokeless tobacco: Never  Vaping Use   Vaping status: Never Used  Substance and Sexual Activity   Alcohol   use: Yes    Alcohol /week: 7.0 standard drinks of alcohol     Types: 7 Glasses of wine per week    Comment: 1 per day -- brandy or wine   Drug use: No   Sexual activity: Yes    Comment: lives with wife, retired from [employer] driving, no dietary restrictions  Other Topics Concern   Not on file  Social History Narrative   He is a married father of 2, and grandfather of 4. He has been married to his wife [name] for 51 years. He previously lived in [place]  but has moved with his wife to [place], [place] to be close to his daughter [name] and her family. [name] is our Teacher, music.   He previously worked as a Naval architect and had 2 years of college after high school. He quit smoking in 1990. He has 6-7 glasses of brandy  or wine a week.    He exercises regularly with low impact aerobics 2 days a week and daily walks of 30-45 minutes. His exercise sessions or 60 minutes. He will do some type of exercise at least 7 days a week and is very active doing yard work and wood work.      Patient is right-handed. He lives with his wife in a one level home with a basement. He drinks 4-5 cups of decaf coffee a day. He and his wife go to the gym 1-2 x a week.   Social Drivers of Corporate investment banker Strain: Low Risk  (07/11/2024)   Overall Financial Resource Strain (CARDIA)    Difficulty of Paying Living Expenses: Not hard at all  Food Insecurity: No Food Insecurity (07/11/2024)   Hunger Vital Sign    Worried About Running Out of Food in the Last Year: Never true    Ran Out of Food in the Last Year: Never true  Transportation Needs: No Transportation Needs (07/11/2024)   PRAPARE - Administrator, Civil Service (Medical): No    Lack of Transportation (Non-Medical): No  Physical Activity: Sufficiently Active (07/11/2024)   Exercise Vital Sign    Days of Exercise per Week: 5 days    Minutes of Exercise per Session: 60 min  Stress: No Stress Concern Present (07/11/2024)   [employer] of Occupational Health - Occupational Stress Questionnaire    Feeling of Stress: Not at all  Social Connections: Moderately Isolated (07/11/2024)   Social Connection and Isolation Panel    Frequency of Communication with Friends and Family: More than three times a week    Frequency of Social Gatherings with Friends and Family: Three times a week    Attends Religious Services: Never    Active Member of Clubs or Organizations: No    Attends Banker Meetings: Not on file    Marital Status: Married  Catering manager Violence: Not At Risk (08/05/2021)   Humiliation, Afraid, Rape, and Kick questionnaire    Fear of Current or Ex-Partner: No    Emotionally Abused: No    Physically Abused: No    Sexually Abused: No    Outpatient Medications Prior to Visit  Medication Sig Dispense Refill   Cholecalciferol (VITAMIN D3) 125 MCG (5000 UT) CAPS Take 1 capsule (5,000 Units total) by mouth daily. (Patient taking differently: Take 5,000 Units by mouth daily.) 30 capsule 0   ELIQUIS  5 MG TABS tablet Take 1 tablet by mouth twice daily 180 tablet 1   gabapentin  (NEURONTIN ) 300 MG capsule TAKE 3 CAPSULES BY MOUTH THREE TIMES DAILY 270 capsule 2   Multiple Vitamins-Minerals (MULTIVITAMIN MEN 50+) TABS Take by mouth daily.     sildenafil (VIAGRA) 100 MG tablet Take 100 mg by mouth daily as needed for erectile dysfunction.     tamsulosin  (FLOMAX ) 0.4 MG CAPS capsule Take 1 capsule (0.4 mg total) by mouth at bedtime. 14 capsule 0   No facility-administered medications prior to visit.    No Known Allergies  Review of Systems  Constitutional:  Negative for fever and malaise/fatigue.  HENT:  Negative for congestion.   Eyes:  Negative for blurred vision.  Respiratory:  Negative for shortness of breath.   Cardiovascular:  Negative for chest pain, palpitations and leg swelling.  Gastrointestinal:  Negative for abdominal pain, blood in stool and nausea.  Genitourinary:  Negative for  dysuria, frequency and urgency.  Musculoskeletal:  Positive for myalgias. Negative  for falls.  Skin:  Negative for rash.  Neurological:  Positive for tingling. Negative for dizziness, loss of consciousness and headaches.  Endo/Heme/Allergies:  Negative for environmental allergies.  Psychiatric/Behavioral:  Negative for depression. The patient is not nervous/anxious.        Objective:    Physical Exam Vitals reviewed.  Constitutional:      Appearance: Normal appearance. He is not ill-appearing.  HENT:     Head: Normocephalic and atraumatic.     Nose: Nose normal.  Eyes:     Conjunctiva/sclera: Conjunctivae normal.  Cardiovascular:     Rate and Rhythm: Normal rate.     Pulses: Normal pulses.     Heart sounds: Normal heart sounds. No murmur heard. Pulmonary:     Effort: Pulmonary effort is normal.     Breath sounds: Normal breath sounds. No wheezing.  Abdominal:     Palpations: Abdomen is soft. There is no mass.     Tenderness: There is no abdominal tenderness.  Musculoskeletal:     Cervical back: Normal range of motion.     Right lower leg: No edema.     Left lower leg: No edema.  Skin:    General: Skin is warm and dry.  Neurological:     General: No focal deficit present.     Mental Status: He is alert and oriented to person, place, and time.  Psychiatric:        Mood and Affect: Mood normal.     BP 118/80   Pulse 70   Resp 16   Ht 5' 8.5 (1.74 m)   Wt 222 lb 3.2 oz (100.8 kg)   SpO2 96%   BMI 33.29 kg/m  Wt Readings from Last 3 Encounters:  07/12/24 222 lb 3.2 oz (100.8 kg)  07/05/24 212 lb (96.2 kg)  05/22/24 224 lb 12.8 oz (102 kg)    Diabetic Foot Exam - Simple   No data filed    Lab Results  Component Value Date   WBC 5.3 09/13/2022   HGB 15.7 09/13/2022   HCT 46.7 09/13/2022   PLT 177 09/13/2022   GLUCOSE 84 12/15/2023   CHOL 135 12/15/2023   TRIG 54 12/15/2023   HDL 49 12/15/2023   LDLCALC 74 12/15/2023   ALT 16 12/15/2023   AST 20  12/15/2023   NA 142 12/15/2023   K 4.6 12/15/2023   CL 104 12/15/2023   CREATININE 1.02 12/15/2023   BUN 23 12/15/2023   CO2 23 12/15/2023   TSH 2.040 08/10/2022   PSA 4.94 (H) 01/15/2010   INR 1.22 04/04/2015   HGBA1C 5.8 (H) 12/15/2023    Lab Results  Component Value Date   TSH 2.040 08/10/2022   Lab Results  Component Value Date   WBC 5.3 09/13/2022   HGB 15.7 09/13/2022   HCT 46.7 09/13/2022   MCV 97 09/13/2022   PLT 177 09/13/2022   Lab Results  Component Value Date   NA 142 12/15/2023   K 4.6 12/15/2023   CO2 23 12/15/2023   GLUCOSE 84 12/15/2023   BUN 23 12/15/2023   CREATININE 1.02 12/15/2023   BILITOT 1.0 12/15/2023   ALKPHOS 62 12/15/2023   AST 20 12/15/2023   ALT 16 12/15/2023   PROT 6.6 12/15/2023   ALBUMIN 4.0 12/15/2023   CALCIUM 9.4 12/15/2023   ANIONGAP 11 12/29/2021   EGFR 74 12/15/2023   GFR 80.84 10/11/2019   Lab Results  Component Value Date   CHOL 135 12/15/2023  Lab Results  Component Value Date   HDL 49 12/15/2023   Lab Results  Component Value Date   LDLCALC 74 12/15/2023   Lab Results  Component Value Date   TRIG 54 12/15/2023   Lab Results  Component Value Date   CHOLHDL 2.5 08/10/2022   Lab Results  Component Value Date   HGBA1C 5.8 (H) 12/15/2023       Assessment & Plan:  Class 2 severe obesity with serious comorbidity and body mass index (BMI) of 35.0 to 35.9 in adult, unspecified obesity type (HCC) Assessment & Plan: Following with healthy weight and wellness Needs 7-8 hours of sleep nightly. Avoid trans fats, eat small, frequent meals every 4-5 hours with lean proteins, complex carbs and healthy fats. Minimize simple carbs, high fat foods and processed foods.    Gastroesophageal reflux disease, unspecified whether esophagitis present Assessment & Plan: Avoid offending foods, start probiotics. Do not eat large meals in late evening and consider raising head of bed.    Hyperglycemia Assessment &  Plan: hgba1c acceptable, minimize simple carbs. Increase exercise as tolerated.   Orders: -     COMPLETE METABOLIC PANEL WITHOUT GFR -     CBC with Differential/Platelet -     Hemoglobin A1c -     TSH  Hypertriglyceridemia Assessment & Plan: Encourage heart healthy diet such as MIND or DASH diet, increase exercise, avoid trans fats, simple carbohydrates and processed foods, consider a krill or fish or flaxseed oil cap daily.   Orders: -     Lipid panel  Vitamin D  deficiency Assessment & Plan: Supplement and monitor  Orders: -     VITAMIN D  25 Hydroxy (Vit-D Deficiency, Fractures)    Assessment and Plan Assessment & Plan Postherpetic Neuralgia Chronic postherpetic neuralgia with intermittent pain exacerbations. Gabapentin  effective for nerve pain. Discussed potential long-term effects, no definitive evidence. Risk-benefit analysis favors continued use. Topical treatments provide temporary relief, capsaicin  patch intolerable. - Continue gabapentin  900 MG oral tid. - Consider reducing gabapentin  by one pill for a short period if desired, monitor pain control. - Continue using hemp freeze as needed for topical relief. - Consider trying CBD Daily cream from [employer] for additional topical relief.  Dehydration Experiences dry mouth and thirst in evenings, indicating possible dehydration. Emphasized importance of hydration, especially in heat, due to decreased muscle mass and water  storage capacity with age. Artificial sweeteners in sugar-free drinks may affect gut health and increase stroke risk. - Encourage regular hydration throughout the day, 5 ounces every hour or 10 ounces every two hours. - Avoid sugar-free drinks due to potential negative effects on gut health and increased risk of stroke. - Consider using SKRATCH electrolyte powder as an alternative to sugar-free drinks, especially during physical activity or heat exposure.  Vitamin D  Deficiency Vitamin D  levels need  monitoring as per Doctor Beasley's request. - Order vitamin D  level test.  General Health Maintenance Discussed importance of balanced nutrition, pairing carbohydrates with protein to manage blood sugar levels and metabolism. Generally healthy and active. - Encourage balanced meals with carbohydrates and protein to stabilize blood sugar levels. - Continue regular physical activity and maintain a healthy diet.     Harlene Horton, MD

## 2024-07-12 NOTE — Patient Instructions (Addendum)
 Nerve Pain After Shingles  Skratch electrolyte powder online  CBD Daily at [employer] Postherpetic neuralgia (PHN) is nerve pain you may get after you have shingles. Shingles is an infection that causes a painful rash and blisters. It's caused by the same germ that causes chickenpox. PHN affects the spot on your body where you had the shingles rash. It can last for 3 months after your rash has gone away. What are the causes? PHN may be caused by damage to your nerves. This damage may come from swelling from the shingles infection. What increases the risk? You may be more likely to get PHN if: You're older than 81 years of age. You have severe pain before your rash starts. You have a very bad rash. You have shingles in and around your eye. Your body defense system (immune system) is weak. What are the signs or symptoms? The main symptom of PHN is pain. The pain may: Be stabbing, burning, or shooting. Feel like an electric shock. Come and go, or it may be there all the time. Get worse if: Something touches your skin. The temperature goes up or down. You may also have itch   + ng. How is this diagnosed? PHN may be diagnosed based on: Your symptoms. Whether you've had shingles before. How is this treated? There's no cure, but treatment can help with the pain. Normal pain medicines may not help. You may need to work with an expert in treating pain to find what works best for you. Treatment may include: Anti-seizure medicines. Antidepressants. Strong pain medicines. A numbing patch worn on the skin. Shots of: Numbing medicines. Medicines to treat inflammation. Botulinum toxin. This can block pain signals and stop you from feeling pain. Follow these instructions at home: Medicines Take over-the-counter and prescription medicines only as told by your health care provider. Ask your provider if the medicine prescribed to you: Requires you to avoid driving or using  machinery. Can cause trouble pooping or constipation. You may need to take these actions to prevent or treat trouble pooping: Drink enough fluid to keep your pee (urine) pale yellow. Take over-the-counter or prescription medicines. Eat foods that are high in fiber, such as beans, whole grains, and fresh fruits and vegetables. Limit foods that are high in fat and processed sugars, such as fried or sweet foods. Managing pain  If told, put ice on the painful area. Put ice in a plastic bag. Place a towel between your skin and the bag. Leave the ice on for 20 minutes, 2-3 times a day. If your skin turns bright red, remove the ice right away to prevent skin damage. The risk of damage is higher if you can't feel pain, heat, or cold. Cover sensitive spots with a bandage, or dressing, to stop clothes from rubbing. Wear loose clothes. General instructions It may take a long time for you to get better. Work closely with your provider. Think about talking with a mental health care provider. They can help you find ways to cope with feeling overwhelmed or hopeless. Have a good support system at home. Think about joining a pain support group. How is this prevented? Vaccines are the best way to prevent shingles and PHN. You should get the vaccine shot for shingles once you're older than 81 years of age. Talk with your provider about getting the shot. Contact a health care provider if: Your medicine isn't helping. You can't manage your pain at home. You feel sad or depressed. Get help right  away if: You have thoughts about hurting yourself or others. Get help right away if you feel like you may hurt yourself or others, or have thoughts about taking your own life. Go to your nearest emergency room or: Call 911. Call the National Suicide Prevention Lifeline at 9410586024 or 988. This is open 24 hours a day. Text the Crisis Text Line at 507-618-2479. This information is not intended to replace advice given  to you by your health care provider. Make sure you discuss any questions you have with your health care provider. Document Revised: 03/17/2023 Document Reviewed: 03/17/2023 Elsevier Patient Education  2024 [employer]Schedule AWV

## 2024-08-18 ENCOUNTER — Other Ambulatory Visit: Payer: Self-pay | Admitting: Family Medicine

## 2024-08-31 ENCOUNTER — Ambulatory Visit: Payer: Medicare Other | Admitting: Internal Medicine

## 2024-10-01 ENCOUNTER — Ambulatory Visit: Admitting: Radiology

## 2024-10-01 ENCOUNTER — Encounter: Payer: Self-pay | Admitting: Emergency Medicine

## 2024-10-01 ENCOUNTER — Ambulatory Visit
Admission: EM | Admit: 2024-10-01 | Discharge: 2024-10-01 | Disposition: A | Discharge status: Home / Self Care | Attending: Nurse Practitioner | Admitting: Nurse Practitioner

## 2024-10-01 DIAGNOSIS — S62521B Displaced fracture of distal phalanx of right thumb, initial encounter for open fracture: Secondary | ICD-10-CM | POA: Diagnosis not present

## 2024-10-01 DIAGNOSIS — S61111A Laceration without foreign body of right thumb with damage to nail, initial encounter: Secondary | ICD-10-CM

## 2024-10-01 DIAGNOSIS — S61011A Laceration without foreign body of right thumb without damage to nail, initial encounter: Secondary | ICD-10-CM | POA: Diagnosis not present

## 2024-10-01 DIAGNOSIS — S62639A Displaced fracture of distal phalanx of unspecified finger, initial encounter for closed fracture: Secondary | ICD-10-CM | POA: Diagnosis not present

## 2024-10-01 MED ORDER — AMOXICILLIN-POT CLAVULANATE 875-125 MG PO TABS: 1.0000 | ORAL_TABLET | Freq: Two times a day (BID) | ORAL | 0 refills | Status: AC

## 2024-10-01 NOTE — Discharge Instructions (Addendum)
 You were seen today for an injury to your right thumb caused by a table saw. Your exam and X-ray showed a cut through the tip of your thumb with a small fracture of the bone at the fingertip. The cut went through part of the nail, and because a portion of the nail was lost, the nailbed could not be fully repaired. The laceration itself was cleaned and stitched. A special gauze dressing (Xeroform) was placed over the wound, and your thumb was placed in a splint for protection. You were prescribed antibiotics to help prevent infection. Please take them exactly as directed and finish the full course. Keep the dressing and splint in place and dry. Change the outer wrap if it becomes loose or soiled, but do not remove the inner dressing unless instructed. When changing the dressing, wash your hands first, gently clean around the area if needed, apply clean gauze, and keep the splint on. It is normal to have mild soreness, swelling, or stiffness as the injury heals. You can use acetaminophen  as directed for pain. Keep your hand elevated as much as possible for the next couple of days to help reduce swelling. You were given a referral to a hand specialist who will provide follow-up care, especially because of the fracture and nail involvement. Please call to schedule this appointment as soon as possible. Go to the emergency department right away if you notice increasing pain, worsening swelling, spreading redness, pus or foul-smelling drainage, fever, numbness, or if the splint feels too tight and you lose circulation in your finger.

## 2024-10-01 NOTE — ED Provider Notes (Signed)
 [facility]    CSN: [id number] Arrival date & time: 10/01/24  1254      History   Chief Complaint Chief Complaint  Patient presents with   Laceration    HPI [name] [name] is a 83 y.o. male.   Discussed the use of AI scribe software for clinical note transcription with the patient, who gave verbal consent to proceed.   The patient presents with right thumb injury sustained while using a table saw. The injury occurred when the patient was not using his safety pointer to guide a piece of wood through the saw. He reports that his finger got too close to the saw blade, resulting in a laceration to the tip of the thumb and through the nail. The patient denies any pain, numbness, or tingling in the affected finger. He is currently taking Eliquis  twice daily for history of A-fib. His tetanus vaccination was 03/2022 and is up to date.  The following sections of the patient's history were reviewed and updated as appropriate: allergies, current medications, past family history, past medical history, past social history, past surgical history, and problem list.         Past Medical History:  Diagnosis Date   Atrial fibrillation (HCC)    BPH (benign prostatic hypertrophy)    Bradycardia 12/30/2014   HR routinely in mid 40s-50s   Coronary artery disease (CAD) excluded 03/2015   False-positive nuclear stress test suggesting inferior ischemia   Dysrhythmia    Elevated PSA    Encounter for Medicare annual wellness exam 09/22/2013   Sees Dr Rolan Molt of Derm Sees Dr Lupita Commander of Gastroenterology Sees Dr Oneil Rafter of [facility] Sees Dr Randon Bile of Optometry   GERD (gastroesophageal reflux disease)    controlled w/ diet and behavioral changes, history of   Grade I diastolic dysfunction 2016   History of kidney stones    Hypertriglyceridemia 03/16/2015   Kidney stones    Lymphadenitis    Melanoma of back (HCC)    Excised Dr Rolan Molt   Mild  ascending aorta dilatation    New onset a-fib (HCC) 03/10/2015   OA (osteoarthritis)    Otitis, externa, infective 08/10/2015   Palpitations    Pneumonia 2004   Post herpetic neuralgia    Shingles 07/09/2013   Sleep apnea 07/30/2011   cpap- 13    Snoring disorder 07/30/2011    Patient Active Problem List   Diagnosis Date Noted   Healthcare maintenance 04/05/2024   Central adiposity 07/06/2023   BMI 31.0-31.9,adult 01/18/2023   Obesity, Beginning BMI 35.0 01/18/2023   Dehydration 09/13/2022   Prediabetes 09/13/2022   Elevated MCV 09/13/2022   Hypercoagulable state due to permanent atrial fibrillation (HCC) 02/09/2022   Kidney stone 01/28/2022   S/P reverse total shoulder arthroplasty, left 10/24/2020   Preop cardiovascular exam 10/10/2020   Bilateral hearing loss 09/11/2020   Arthritis of left glenohumeral joint 06/25/2020   Muscle cramps 10/14/2019   Hearing loss of right ear 10/14/2019   Tinnitus of right ear 10/14/2019   Bilateral inguinal herniae (BIH) s/p lap repair w mesh 06/01/2019 06/01/2019   Right Femoral hernia s/p lap repair w mesh 06/01/2019 06/01/2019   Groin pain, left 12/11/2018   Class 2 severe obesity with serious comorbidity and body mass index (BMI) of 35.0 to 35.9 in adult 08/18/2017   Other fatigue 08/18/2017   Vitamin D  deficiency 08/18/2017   Hyperglycemia 08/18/2017   Lymphedema 09/21/2015   Preventative health care 09/21/2015  Abnormal exercise myocardial perfusion study 04/02/2015   Permanent atrial fibrillation (HCC) 03/16/2015   Hypertriglyceridemia 03/16/2015   Bradycardia 12/30/2014   Melanoma of back [facility])    Mass of chest wall, right subfacial, 12cm s/p removal w rib resection 05/16/2014 03/08/2014   Mass of posterior neck, SQ 4cm 03/08/2014   Chest wall mass - right SQ anterior 5cm 08/21/2013   Post herpetic neuralgia 07/09/2013   OSA on CPAP 07/30/2011   ELEVATED PROSTATE SPECIFIC ANTIGEN 01/13/2010   LIPOMA, SKIN 08/29/2008   GERD  08/29/2008   BENIGN PROSTATIC HYPERTROPHY 08/29/2008    Past Surgical History:  Procedure Laterality Date   CATARACT EXTRACTION Right    COLONOSCOPY  2018   CYSTOSCOPY/URETEROSCOPY/HOLMIUM LASER/STENT PLACEMENT Left 10/05/2019   Procedure: CYSTOSCOPY/RETROGRADE/URETEROSCOPY/HOLMIUM LASER/STENT PLACEMENT;  Surgeon: Ottelin, Mark, MD;  Location: [facility];  Service: Urology;  Laterality: Left;   EXTRACORPOREAL SHOCK WAVE LITHOTRIPSY Left 01/25/2022   Procedure: EXTRACORPOREAL SHOCK WAVE LITHOTRIPSY (ESWL);  Surgeon: Renda Glance, MD;  Location: [facility];  Service: Urology;  Laterality: Left;   EYE SURGERY Left 12/06/2017   cataract by Dr Ruth FONTANA HERNIA REPAIR Bilateral 06/01/2019   Procedure: LAPAROSCOPIC LEFT AND  RIGHT INGUINAL HERNIA REPAIR WITH MESH;  Surgeon: Sheldon Standing, MD;  Location: [facility];  Service: General;  Laterality: Bilateral;   LEFT HEART CATHETERIZATION WITH CORONARY ANGIOGRAM N/A 04/10/2015   Procedure: LEFT HEART CATHETERIZATION WITH  CORONARY ANGIOGRAM;  Surgeon: Alm LELON Clay, MD;  Location: [facility] CATH LAB;  Service: Cardiovascular;  Angiographicallly NORMAL CORONARY ARTERIES   MASS EXCISION N/A 05/16/2014   Procedure: EXCISION POSTERIOR NECK MASS, RIGHT CHEST WALL MASS AND RIGHT AXILLARY MASS AXILLARY NODE DISSECTION;  Surgeon: Standing KYM Sheldon, MD;  Location: [facility];  Service: General;  Laterality: N/A;   melanoma removal     MOUTH SURGERY N/A    NM MYOVIEW  LTD  03/19/2015   FALSE POSITIVE:  INTERMEDIATE RISK. Small sized, moderate intensity inferior ischemic perfusion defect   REVERSE SHOULDER ARTHROPLASTY Left 10/16/2020   Procedure: REVERSE SHOULDER ARTHROPLASTY;  Surgeon: Melita Drivers, MD;  Location: [facility];  Service: Orthopedics;  Laterality: Left;    TRANSTHORACIC ECHOCARDIOGRAM  03/20/2015   Normal LV size and function. EF 60-65%. G1 DD. Trivial AI. Borderline aortic root dilation (41 mm),  Mild LA dilation.   VEIN LIGATION AND STRIPPING Left 07/13/2021   Procedure: LIGATION AND STRIPPING OF LEFT GREAT SAPHENOUS VEIN WITH STAB PHLEBECTOMY GREATER THAN 20 INCISIONS TO LEFT LEG;  Surgeon: Eliza Lonni RAMAN, MD;  Location: [facility] OR;  Service: Vascular;  Laterality: Left;       Home Medications    Prior to Admission medications   Medication Sig Start Date End Date Taking? Authorizing Provider  amoxicillin-clavulanate (AUGMENTIN) 875-125 MG tablet Take 1 tablet by mouth 2 (two) times daily after a meal for 10 days. 10/01/24 10/11/24 Yes Iola Lukes, FNP  Cholecalciferol (VITAMIN D3) 125 MCG (5000 UT) CAPS Take 1 capsule (5,000 Units total) by mouth daily. Patient taking differently: Take 5,000 Units by mouth daily. 08/27/20   Verdon Parry D, MD  ELIQUIS  5 MG TABS tablet Take 1 tablet by mouth twice daily 06/25/24   Clay Alm LELON, MD  gabapentin  (NEURONTIN ) 300 MG capsule Take 3 capsules (900 mg total) by mouth 3 (three) times daily. 08/20/24   Domenica Harlene LABOR, MD  Multiple Vitamins-Minerals (MULTIVITAMIN MEN 50+) TABS Take by mouth daily.    [provider]  sildenafil (  VIAGRA) 100 MG tablet Take 100 mg by mouth daily as needed for erectile dysfunction. 04/23/21   [provider]  tamsulosin  (FLOMAX ) 0.4 MG CAPS capsule Take 1 capsule (0.4 mg total) by mouth at bedtime. 01/25/22   Renda Glance, MD    Family History Family History  Problem Relation Age of Onset   Cancer Mother 38       MM, leukemia   Obesity Mother    Emphysema Father 27   Obesity Father    Alcohol  abuse Father    Cancer Sister 22       brain   Diabetes Sister 63   Obesity Sister    COPD Brother 1   Down syndrome Brother 35       aspirated   Diabetes Son    Pancreatitis Son    Heart disease Paternal Grandmother    COPD Paternal Grandfather    Alcohol  abuse Other    Colon cancer Neg Hx     Social History Social History   Tobacco Use   Smoking status: Former     Types: Pipe, Cigars    Quit date: 02/11/1989    Years since quitting: 35.6   Smokeless tobacco: Never  Vaping Use   Vaping status: Never Used  Substance Use Topics   Alcohol  use: Yes    Alcohol /week: 7.0 standard drinks of alcohol     Types: 7 Glasses of wine per week    Comment: 1 per day -- brandy or wine   Drug use: No     Allergies   Patient has no known allergies.   Review of Systems Review of Systems  Skin:  Positive for wound.  Neurological:  Negative for numbness.  All other systems reviewed and are negative.    Physical Exam Triage Vital Signs ED Triage Vitals  Encounter Vitals Group     BP 10/01/24 1530 130/84     Girls Systolic BP Percentile --      Girls Diastolic BP Percentile --      Boys Systolic BP Percentile --      Boys Diastolic BP Percentile --      Pulse Rate 10/01/24 1530 83     Resp 10/01/24 1530 17     Temp 10/01/24 1530 98 F (36.7 C)     Temp Source 10/01/24 1530 Oral     SpO2 10/01/24 1530 96 %     Weight --      Height --      Head Circumference --      Peak Flow --      Pain Score 10/01/24 1531 0     Pain Loc --      Pain Education --      Exclude from Growth Chart --    No data found.  Updated Vital Signs BP 130/84 (BP Location: Right Arm)   Pulse 83   Temp 98 F (36.7 C) (Oral)   Resp 17   SpO2 96%   Visual Acuity Right Eye Distance:   Left Eye Distance:   Bilateral Distance:    Right Eye Near:   Left Eye Near:    Bilateral Near:     Physical Exam Vitals reviewed.  Constitutional:      General: He is awake. He is not in acute distress.    Appearance: Normal appearance. He is well-developed. He is not ill-appearing, toxic-appearing or diaphoretic.  HENT:     Head: Normocephalic.     Right Ear: Hearing normal.  Left Ear: Hearing normal.     Nose: Nose normal.     Mouth/Throat:     Mouth: Mucous membranes are moist.  Eyes:     General: Vision grossly intact.     Conjunctiva/sclera: Conjunctivae normal.   Cardiovascular:     Rate and Rhythm: Normal rate and regular rhythm.     Heart sounds: Normal heart sounds.  Pulmonary:     Effort: Pulmonary effort is normal.     Breath sounds: Normal breath sounds and air entry.  Musculoskeletal:        General: Normal range of motion.     Cervical back: Normal range of motion and neck supple.  Skin:    General: Skin is warm and dry.     Findings: Laceration and wound present.     Comments: Approximately 4 cm laceration is present along the distal aspect of the right thumb with ragged, non-approximated wound edges. There is an associated 1.5 cm linear laceration through the nailbed with partial nail loss. The thumb demonstrates full strength, intact sensation, and preserved range of motion. Neurovascular status is intact.  Neurological:     General: No focal deficit present.     Mental Status: He is alert and oriented to person, place, and time.  Psychiatric:        Speech: Speech normal.        Behavior: Behavior is cooperative.             UC Treatments / Results  Labs (all labs ordered are listed, but only abnormal results are displayed) Labs Reviewed - No data to display  EKG   Radiology DG Finger Thumb Right Result Date: 10/01/2024 CLINICAL DATA:  cut thumb with saw EXAM: RIGHT THUMB 2+V COMPARISON:  None Available. FINDINGS: Longitudinally oriented fracture of the phalangeal tuft of the first distal phalanx. 2 mm of distraction between the bony fragment and distal phalanx. No dislocation. There is no evidence of arthropathy or other focal bone abnormality. Soft tissue irregularity consistent with overlying laceration. IMPRESSION: Longitudinally oriented, mildly displaced fracture through the phalangeal tuft of the first distal phalanx. Given the overlying soft tissue injury, this is worrisome for an open fracture. Electronically Signed   By: Rogelia Myers M.D.   On: 10/01/2024 16:26    Procedures Laceration  Repair  Date/Time: 10/01/2024 5:43 PM  Performed by: Iola Lukes, FNP Authorized by: Iola Lukes, FNP   Consent:    Consent obtained:  Verbal   Consent given by:  Patient   Risks, benefits, and alternatives were discussed: yes     Risks discussed:  Infection, pain, need for additional repair, poor cosmetic result and poor wound healing Universal protocol:    Patient identity confirmed:  Verbally with patient and arm band Anesthesia:    Anesthesia method:  Local infiltration   Local anesthetic:  Lidocaine  1% w/o epi Laceration details:    Location:  Finger   Finger location:  R thumb   Length (cm):  4 Exploration:    Contaminated: no   Treatment:    Area cleansed with:  Shur-Clens   Amount of cleaning:  Extensive Skin repair:    Repair method:  Sutures   Suture size:  3-0, 4-0 and 5-0   Suture material:  Prolene   Suture technique:  Simple interrupted   Number of sutures:  16 Approximation:    Approximation:  Close Repair type:    Repair type:  Complex Post-procedure details:    Dressing:  Antibiotic ointment,  non-adherent dressing and splint for protection   Procedure completion:  Tolerated well, no immediate complications  (including critical care time)        Medications Ordered in UC Medications - No data to display  Initial Impression / Assessment and Plan / UC Course  I have reviewed the triage vital signs and the nursing notes.  Pertinent labs & imaging results that were available during my care of the patient were reviewed by me and considered in my medical decision making (see chart for details).     The patient presents with a table saw injury to the right thumb resulting in a distal laceration with nailbed involvement. X-ray imaging confirmed a fracture of the phalangeal tuft of the first distal phalanx. Tdap is up to date. The laceration was irrigated and repaired, though the nailbed could not be repaired due to partial nail loss from the  injury. Xeroform gauze was applied, and the thumb was splinted for protection. Antibiotics were prescribed for infection prophylaxis, and wound care instructions were provided. Given the fracture and nailbed involvement, the patient was referred to a hand specialist for further evaluation and management. The patient was advised to monitor for worsening pain, swelling, redness, or drainage and to return immediately for any concerning changes.  Today's evaluation has revealed no signs of a dangerous process. Discussed diagnosis with patient and/or guardian. Patient and/or guardian aware of their diagnosis, possible red flag symptoms to watch out for and need for close follow up. Patient and/or guardian understands verbal and written discharge instructions. Patient and/or guardian comfortable with plan and disposition.  Patient and/or guardian has a clear mental status at this time, good insight into illness (after discussion and teaching) and has clear judgment to make decisions regarding their care  Documentation was completed with the aid of voice recognition software. Transcription may contain typographical errors.  Final Clinical Impressions(s) / UC Diagnoses   Final diagnoses:  Laceration of right thumb without foreign body with damage to nail, initial encounter  Open fracture of tuft of distal phalanx of right thumb     Discharge Instructions      You were seen today for an injury to your right thumb caused by a table saw. Your exam and X-ray showed a cut through the tip of your thumb with a small fracture of the bone at the fingertip. The cut went through part of the nail, and because a portion of the nail was lost, the nailbed could not be fully repaired. The laceration itself was cleaned and stitched. A special gauze dressing (Xeroform) was placed over the wound, and your thumb was placed in a splint for protection. You were prescribed antibiotics to help prevent infection. Please take them  exactly as directed and finish the full course. Keep the dressing and splint in place and dry. Change the outer wrap if it becomes loose or soiled, but do not remove the inner dressing unless instructed. When changing the dressing, wash your hands first, gently clean around the area if needed, apply clean gauze, and keep the splint on. It is normal to have mild soreness, swelling, or stiffness as the injury heals. You can use acetaminophen  as directed for pain. Keep your hand elevated as much as possible for the next couple of days to help reduce swelling. You were given a referral to a hand specialist who will provide follow-up care, especially because of the fracture and nail involvement. Please call to schedule this appointment as soon as possible. Go  to the emergency department right away if you notice increasing pain, worsening swelling, spreading redness, pus or foul-smelling drainage, fever, numbness, or if the splint feels too tight and you lose circulation in your finger.     ED Prescriptions     Medication Sig Dispense Auth. Provider   amoxicillin-clavulanate (AUGMENTIN) 875-125 MG tablet Take 1 tablet by mouth 2 (two) times daily after a meal for 10 days. 20 tablet Iola Lukes, FNP      PDMP not reviewed this encounter.   Iola Lukes, [place] 10/01/24 1816

## 2024-10-01 NOTE — ED Triage Notes (Addendum)
 Pt presents due to laceration on right thumb. States he was in shop and cut finger with saw. Cut is trough nail and deep into finger. Pt takes Eliquis  but bleeding is currently minimal with pressure applied.

## 2024-10-02 DIAGNOSIS — S6991XA Unspecified injury of right wrist, hand and finger(s), initial encounter: Secondary | ICD-10-CM | POA: Diagnosis not present

## 2024-10-02 DIAGNOSIS — S62521B Displaced fracture of distal phalanx of right thumb, initial encounter for open fracture: Secondary | ICD-10-CM | POA: Diagnosis not present

## 2024-10-03 DIAGNOSIS — S61111A Laceration without foreign body of right thumb with damage to nail, initial encounter: Secondary | ICD-10-CM | POA: Diagnosis not present

## 2024-10-03 DIAGNOSIS — S62521B Displaced fracture of distal phalanx of right thumb, initial encounter for open fracture: Secondary | ICD-10-CM | POA: Diagnosis not present

## 2024-10-04 ENCOUNTER — Encounter (INDEPENDENT_AMBULATORY_CARE_PROVIDER_SITE_OTHER): Payer: Self-pay | Admitting: Family Medicine

## 2024-10-04 ENCOUNTER — Ambulatory Visit (INDEPENDENT_AMBULATORY_CARE_PROVIDER_SITE_OTHER): Admitting: Family Medicine

## 2024-10-04 VITALS — BP 116/76 | HR 73 | Temp 97.4°F | Ht 68.5 in | Wt 217.0 lb

## 2024-10-04 DIAGNOSIS — E559 Vitamin D deficiency, unspecified: Secondary | ICD-10-CM

## 2024-10-04 DIAGNOSIS — E669 Obesity, unspecified: Secondary | ICD-10-CM

## 2024-10-04 DIAGNOSIS — R7303 Prediabetes: Secondary | ICD-10-CM

## 2024-10-04 DIAGNOSIS — E860 Dehydration: Secondary | ICD-10-CM

## 2024-10-04 DIAGNOSIS — S61111D Laceration without foreign body of right thumb with damage to nail, subsequent encounter: Secondary | ICD-10-CM | POA: Diagnosis not present

## 2024-10-04 DIAGNOSIS — Z6832 Body mass index (BMI) 32.0-32.9, adult: Secondary | ICD-10-CM

## 2024-10-04 NOTE — Progress Notes (Signed)
 Office: [phone]  /  Fax: [phone]  WEIGHT SUMMARY AND BIOMETRICS  Anthropometric Measurements Height: 5' 8.5 (1.74 m) Weight: 217 lb (98.4 kg) BMI (Calculated): 32.51 Weight at Last Visit: 212 lb Weight Lost Since Last Visit: 0 Weight Gained Since Last Visit: 0 Starting Weight: 237 lb Total Weight Loss (lbs): 20 lb (9.072 kg) Peak Weight: 25 lb   Body Composition  Body Fat %: 33.8 % Fat Mass (lbs): 73.6 lbs Muscle Mass (lbs): 136.8 lbs Total Body Water  (lbs): 107.2 lbs Visceral Fat Rating : 22   Other Clinical Data Fasting: yes Labs: no Today's Visit #: 60 Starting Date: 08/18/18    Chief Complaint: OBESITY    History of Present Illness [name] [name] is an 83 year old male who presents for obesity treatment and progress assessment.  He has successfully maintained a 20-pound weight loss over the last three months, currently weighing 217 pounds, up from a previous low of 208 pounds. He follows the category three plan about 50% of the time, increases his intake of fruits and vegetables, and meets his recommended protein intake. He does not always hydrate adequately but ensures not to skip meals and gets seven or more hours of sleep most nights. He remains physically active, working in his shop and doing woodworking.  He recently sustained a right thumb injury while using a table saw, which required urgent care attention. The injury resulted in 16 stitches and removal of the fingernail.  He has a history of vitamin D  deficiency and has been taking over-the-counter vitamin D  at 5,000 IU per day, which has been stable. He recently reduced the dose to 2,000 IU per day. His vitamin D  level was 61.  He manages his weight by mentally tracking his intake and adjusting as needed. He notes fluctuations in his weight but maintains it within a tight range. He is preparing for a two-week cruise from [place] to [facility], [place] , and plans to maintain his  dietary habits by eating in the dining room rather than the buffet and limiting alcohol  intake.      PHYSICAL EXAM:  Blood pressure 116/76, pulse 73, temperature (!) 97.4 F (36.3 C), height 5' 8.5 (1.74 m), weight 217 lb (98.4 kg), SpO2 100%. Body mass index is 32.51 kg/m.  DIAGNOSTIC DATA REVIEWED:  BMET    Component Value Date/Time   NA 140 07/12/2024 0942   NA 142 12/15/2023 0910   K 4.5 07/12/2024 0942   CL 107 07/12/2024 0942   CO2 27 07/12/2024 0942   GLUCOSE 89 07/12/2024 0942   BUN 32 (H) 07/12/2024 0942   BUN 23 12/15/2023 0910   CREATININE 0.95 07/12/2024 0942   CALCIUM 9.2 07/12/2024 0942   GFRNONAA >60 12/29/2021 1432   GFRAA 82 12/03/2020 0930   Lab Results  Component Value Date   HGBA1C 6.0 07/12/2024   HGBA1C 5.8 (H) 08/18/2017   Lab Results  Component Value Date   INSULIN  5.7 12/15/2023   INSULIN  17.6 08/18/2017   Lab Results  Component Value Date   TSH 1.58 07/12/2024   CBC    Component Value Date/Time   WBC 5.6 07/12/2024 0942   RBC 4.60 07/12/2024 0942   HGB 14.9 07/12/2024 0942   HGB 15.7 09/13/2022 0925   HCT 44.7 07/12/2024 0942   HCT 46.7 09/13/2022 0925   PLT 174.0 07/12/2024 0942   PLT 177 09/13/2022 0925   MCV 97.3 07/12/2024 0942   MCV 97 09/13/2022 0925   MCH  32.6 09/13/2022 0925   MCH 33.0 12/29/2021 1432   MCHC 33.3 07/12/2024 0942   RDW 14.5 07/12/2024 0942   RDW 13.0 09/13/2022 0925   Iron Studies No results found for: IRON, TIBC, FERRITIN, IRONPCTSAT Lipid Panel     Component Value Date/Time   CHOL 148 07/12/2024 0942   CHOL 135 12/15/2023 0910   TRIG 95 07/12/2024 0942   HDL 52 07/12/2024 0942   HDL 49 12/15/2023 0910   CHOLHDL 2.8 07/12/2024 0942   VLDL 17.4 10/11/2019 1139   LDLCALC 78 07/12/2024 0942   Hepatic Function Panel     Component Value Date/Time   PROT 6.3 07/12/2024 0942   PROT 6.6 12/15/2023 0910   ALBUMIN 4.0 12/15/2023 0910   AST 17 07/12/2024 0942   ALT 16 07/12/2024 0942    ALKPHOS 62 12/15/2023 0910   BILITOT 0.7 07/12/2024 0942   BILITOT 1.0 12/15/2023 0910   BILIDIR 0.1 02/11/2014 1016   IBILI 0.3 02/11/2014 1016      Component Value Date/Time   TSH 1.58 07/12/2024 0942   Nutritional Lab Results  Component Value Date   VD25OH 61.76 07/12/2024   VD25OH 66.3 12/15/2023   VD25OH 60.5 05/25/2023     Assessment and Plan Assessment & Plan Obesity He has maintained a 20-pound weight loss over the past three months, currently weighing 217 pounds, up from a low of 208 pounds. He follows the category three plan about 50% of the time, meets recommended protein intake, and is physically active. He struggles with hydration and occasionally eating out, leading to higher calorie intake. - Encourage strict adherence to the eating plan and journaling. - Minimize eating out and choose restaurants with known nutritional information. - Discuss cruise strategies to maintain weight, including avoiding buffets and setting walking goals.  Prediabetes His A1c is 6.0, indicating prediabetes, a slight increase from previous levels but within the prediabetic range. - Continue monitoring A1c levels. - Encourage lifestyle modifications to prevent progression to diabetes.  Dehydration, mild, recurrent He was mildly dehydrated during his last blood work, with inadequate hydration and a reduced thirst response. - Encourage adequate hydration and monitor urine color to ensure it is pale yellow or clear.  Vitamin D  deficiency He has been on 5000 IU of vitamin D  daily but recently reduced to 2000 IU, which may have caused a drop in levels. His last vitamin D  level was 61, within the desired range. - Continue vitamin D  2000 IU daily. - Check vitamin D  level today to assess the impact of the dose reduction.  Right thumb laceration with nail injury, healing He sustained a right thumb laceration with a nail injury, received 16 stitches, and had the fingernail removed for cleaning  and re-stitching. The nail is expected to regrow over time. - Keep the thumb wrapped and avoid activities that could disrupt healing. - Consider using a splint to limit movement and protect the sutures. - Ensure the wound remains clean and covered.    [name] [name] was informed of the importance of frequent follow up visits to maximize his success with intensive lifestyle modifications for his obesity and obesity related health conditions as recommended by USPSTF and CMS guidelines   Louann Penton, MD

## 2024-10-05 LAB — VITAMIN D 25 HYDROXY (VIT D DEFICIENCY, FRACTURES): Vit D, 25-Hydroxy: 60 ng/mL (ref 30.0–100.0)

## 2024-10-09 ENCOUNTER — Ambulatory Visit: Admitting: Internal Medicine

## 2024-10-09 DIAGNOSIS — M25641 Stiffness of right hand, not elsewhere classified: Secondary | ICD-10-CM | POA: Diagnosis not present

## 2024-10-10 DIAGNOSIS — H903 Sensorineural hearing loss, bilateral: Secondary | ICD-10-CM | POA: Diagnosis not present

## 2024-11-24 NOTE — Progress Notes (Unsigned)
 HPI M former pipe smoker followed for OSA, complicated by HTN, CAD, Gr 1 DD, PAFib, GERD, Melanoma, Obesity, Varicose veins, Kidney Stone, NPSG 11/13/13- AHI 37/ hr, desaturation to 81%, CPAP to 13, body weight 220 lbs  ===========================================================   08/15/23- 82 yoM former pipe smoker followed for OSA, complicated by HTN, CAD, Gr 1 DD, PAFib/Eliquis , GERD, Melanoma, Obesity, Varicose veins, Kidney Stone, Elastic sleeve after resection arm lipoma,  CPAP auto 5-15/ [facility]    replacement machine ordered 08/10/21 Download compliance-100%, AHI 7.2/ hr   Central > obst. Body weight today-211 lbs Here with wife. She notes occ paused breath- we discussed central apneas. CXR 08/04/23- IMPRESSION: No active cardiopulmonary disease.   11/27/24- 83 yoM former pipe smoker followed for OSA, complicated by HTN, CAD, Gr 1 DD, PAFib/Eliquis , GERD, Melanoma, Obesity, Varicose veins, Kidney Stone, Elastic sleeve after resection arm lipoma,  CPAP auto 5-15/ [facility]    replacement machine ordered 08/10/21 Download compliance- Body weight today-229 lbs     ROS-see HPI   + = positive Constitutional:    weight loss, night sweats, fevers, chills, fatigue, lassitude. HEENT:    headaches, difficulty swallowing, tooth/dental problems, sore throat,       sneezing, itching, ear ache, nasal congestion, post nasal drip, snoring CV:    chest pain, orthopnea, PND, swelling in lower extremities, anasarca,                                   dizziness, palpitations Resp:   shortness of breath with exertion or at rest.                productive cough,   non-productive cough, coughing up of blood.              change in color of mucus.  wheezing.   Skin:    rash or lesions. GI:  No-   heartburn, indigestion, abdominal pain, nausea, vomiting, diarrhea,                 change in bowel habits, loss of appetite GU: dysuria, change in color of urine, no urgency or frequency.   flank pain. MS:    joint pain, stiffness, decreased range of motion, back pain. Neuro-     nothing unusual Psych:  change in mood or affect.  depression or anxiety.   memory loss.  OBJ- Physical Exam General- Alert, Oriented, Affect-appropriate, Distress- none acute, +overweight Skin- rash-none, lesions- none, excoriation- none Lymphadenopathy- none Head- atraumatic            Eyes- Gross vision intact, PERRLA, conjunctivae and secretions clear            Ears- Hearing, canals-normal            Nose- Clear, no-Septal dev, mucus, polyps, erosion, perforation             Throat- Mallampati II-III , mucosa clear , drainage- none, tonsils- atrophic, + teeth Neck- flexible , trachea midline, no stridor , thyroid  nl, carotid no bruit Chest - symmetrical excursion , unlabored           Heart/CV- RRR , no murmur , no gallop  , no rub, nl s1 s2                           - JVD- none , edema- none, stasis changes- none, varices- none  Lung- clear to P&A, wheeze- none, cough- none , dullness-none, rub- none           Chest wall-  Abd-  Br/ Gen/ Rectal- Not done, not indicated Extrem- +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

## 2024-11-27 ENCOUNTER — Encounter: Payer: Self-pay | Admitting: Internal Medicine

## 2024-11-27 ENCOUNTER — Ambulatory Visit: Admitting: Internal Medicine

## 2024-11-27 NOTE — Patient Instructions (Signed)
 We can continue CPAP auto 5-15  Please call if we can help  At checkout, ask the front desk to bring you back with a sleep doctor.

## 2024-12-05 ENCOUNTER — Encounter: Payer: Self-pay | Admitting: Internal Medicine

## 2024-12-09 ENCOUNTER — Other Ambulatory Visit: Payer: Self-pay | Admitting: Cardiology

## 2024-12-09 DIAGNOSIS — I4821 Permanent atrial fibrillation: Secondary | ICD-10-CM

## 2024-12-10 NOTE — Telephone Encounter (Signed)
 Prescription refill request for Eliquis  received. Indication:afib Last office visit:5/25 Scr: 0.95  7/25 Age:83 Weight:103.9  kg  Prescription refilled

## 2025-01-03 ENCOUNTER — Telehealth (INDEPENDENT_AMBULATORY_CARE_PROVIDER_SITE_OTHER): Payer: Self-pay | Admitting: Family Medicine

## 2025-01-03 ENCOUNTER — Encounter (INDEPENDENT_AMBULATORY_CARE_PROVIDER_SITE_OTHER): Payer: Self-pay | Admitting: Family Medicine

## 2025-01-03 VITALS — Ht 68.5 in | Wt 217.0 lb

## 2025-01-03 DIAGNOSIS — R7303 Prediabetes: Secondary | ICD-10-CM | POA: Diagnosis not present

## 2025-01-03 DIAGNOSIS — E559 Vitamin D deficiency, unspecified: Secondary | ICD-10-CM

## 2025-01-03 DIAGNOSIS — E669 Obesity, unspecified: Secondary | ICD-10-CM | POA: Diagnosis not present

## 2025-01-03 DIAGNOSIS — Z6832 Body mass index (BMI) 32.0-32.9, adult: Secondary | ICD-10-CM

## 2025-01-03 NOTE — Progress Notes (Signed)
 "  Office: [phone]  /  Fax: [phone]  WEIGHT SUMMARY AND BIOMETRICS  Anthropometric Measurements Height: 5' 8.5 (1.74 m) Weight: 217 lb (98.4 kg) BMI (Calculated): 32.51 Weight at Last Visit: 217 lb Starting Weight: 237 lb   No data recorded Other Clinical Data Fasting: N/A Labs: N/A Today's Visit #: 70 Starting Date: 08/18/18 Comments: HANLEY    Chief Complaint: OBESITY  Virtual Visit via A/V Note  I connected with [name] [name] on 01/03/2025 at  7:40 AM EST by audiovisual telehealth and verified that I am speaking with the correct person using two identifiers.  Location: Patient: HOME Provider: home   I discussed the limitations, risks, security and privacy concerns of performing an evaluation and management service by AV telehealth and the availability of in person appointments. I also discussed with the patient that there may be a patient responsible charge related to this service. The patient expressed understanding and agreed to proceed.     History of Present Illness [name] [name] is an 84 year old male with obesity and prediabetes who presents for obesity treatment and progress assessment.  He has been following the category three obesity treatment plan approximately ninety percent of the time, focusing on the overall goals rather than specific details. He has lost five pounds since the beginning of the year, with his current weight at 212.9 pounds, down from a high of 220 pounds. He does not engage in formal exercise but remains active through yard work and woodworking, including network engineer. He believes he is consuming the recommended amount of protein and is trying not to skip meals, though he may not be drinking adequate water  daily.  He is scheduled for a checkup on the 19th with his primary care doctor. He continues to take vitamin D  supplements, consuming 2000 units over the counter, approximately seven to eight times a  week.  He has been living alone for many years, doing his own cooking, and his wife passed away six years ago. He remains active with woodworking projects, recently completing ten sets of doors for five houses and making new shutters for his house. He is preparing for a restaurant job next.  He reports no issues with his back, hips, or shoulders and has not experienced any joint problems. He continues to stay active, which he believes helps maintain his joint health. He experiences vertigo and nausea as side effects from a recent neck ablation. No current joint problems.      PHYSICAL EXAM:  Height 5' 8.5 (1.74 m), weight 217 lb (98.4 kg). Body mass index is 32.51 kg/m.  DIAGNOSTIC DATA REVIEWED:  BMET    Component Value Date/Time   NA 140 07/12/2024 0942   NA 142 12/15/2023 0910   K 4.5 07/12/2024 0942   CL 107 07/12/2024 0942   CO2 27 07/12/2024 0942   GLUCOSE 89 07/12/2024 0942   BUN 32 (H) 07/12/2024 0942   BUN 23 12/15/2023 0910   CREATININE 0.95 07/12/2024 0942   CALCIUM 9.2 07/12/2024 0942   GFRNONAA >60 12/29/2021 1432   GFRAA 82 12/03/2020 0930   Lab Results  Component Value Date   HGBA1C 6.0 07/12/2024   HGBA1C 5.8 (H) 08/18/2017   Lab Results  Component Value Date   INSULIN  5.7 12/15/2023   INSULIN  17.6 08/18/2017   Lab Results  Component Value Date   TSH 1.58 07/12/2024   CBC    Component Value Date/Time   WBC 5.6 07/12/2024 0942   RBC  4.60 07/12/2024 0942   HGB 14.9 07/12/2024 0942   HGB 15.7 09/13/2022 0925   HCT 44.7 07/12/2024 0942   HCT 46.7 09/13/2022 0925   PLT 174.0 07/12/2024 0942   PLT 177 09/13/2022 0925   MCV 97.3 07/12/2024 0942   MCV 97 09/13/2022 0925   MCH 32.6 09/13/2022 0925   MCH 33.0 12/29/2021 1432   MCHC 33.3 07/12/2024 0942   RDW 14.5 07/12/2024 0942   RDW 13.0 09/13/2022 0925   Iron Studies No results found for: IRON, TIBC, FERRITIN, IRONPCTSAT Lipid Panel     Component Value Date/Time   CHOL 148  07/12/2024 0942   CHOL 135 12/15/2023 0910   TRIG 95 07/12/2024 0942   HDL 52 07/12/2024 0942   HDL 49 12/15/2023 0910   CHOLHDL 2.8 07/12/2024 0942   VLDL 17.4 10/11/2019 1139   LDLCALC 78 07/12/2024 0942   Hepatic Function Panel     Component Value Date/Time   PROT 6.3 07/12/2024 0942   PROT 6.6 12/15/2023 0910   ALBUMIN 4.0 12/15/2023 0910   AST 17 07/12/2024 0942   ALT 16 07/12/2024 0942   ALKPHOS 62 12/15/2023 0910   BILITOT 0.7 07/12/2024 0942   BILITOT 1.0 12/15/2023 0910   BILIDIR 0.1 02/11/2014 1016   IBILI 0.3 02/11/2014 1016      Component Value Date/Time   TSH 1.58 07/12/2024 0942   Nutritional Lab Results  Component Value Date   VD25OH 60.0 10/04/2024   VD25OH 61.76 07/12/2024   VD25OH 66.3 12/15/2023     Assessment and Plan Assessment & Plan Generalized obesity Following the category three plan approximately 90% of the time, focusing on the spirit rather than the details. Reports a weight loss of five pounds since the beginning of the year. Engages in physical activities such as yard work and woodworking but does not perform formal exercise. Consumes recommended protein and avoids skipping meals but may not drink adequate water  daily. - Continue current diet and physical activity regimen.  Prediabetes Managing prediabetes through diet and exercise. Scheduled to see primary care doctor for blood tests, including hemoglobin A1c and insulin  levels, to monitor condition. A1c reflects three months of blood sugar levels, providing a comprehensive view of glucose control. - Ordered hemoglobin A1c and insulin  tests with primary care doctor. - Continue current diet and exercise regimen.  Vitamin D  deficiency Taking over-the-counter vitamin D , approximately 2000 units, seven to eight times a week. Vitamin D  levels will be checked with upcoming blood tests to ensure he remains within the normal range. - Ordered vitamin D  level test with primary care doctor. -  Continue current vitamin D  supplementation.      Patients who are on anti-obesity medications are counseled on the importance of maintaining healthy lifestyle habits, including balanced nutrition, regular physical activity, and behavioral modifications,  Medication is an adjunct to, not a replacement for, lifestyle changes and that the long-term success and weight maintenance depend on continued adherence to these strategies.   Tieler was informed of the importance of frequent follow up visits to maximize his success with intensive lifestyle modifications for his obesity and obesity related health conditions as recommended by USPSTF and CMS guidelines   Louann Penton, MD   "

## 2025-01-05 ENCOUNTER — Other Ambulatory Visit: Payer: Self-pay | Admitting: Family Medicine

## 2025-01-12 NOTE — Progress Notes (Unsigned)
 "  Subjective:    Patient ID: [name] [name], male    DOB: [birth year], 84 y.o.   MRN: [id number]  No chief complaint on file.   HPI Discussed the use of AI scribe software for clinical note transcription with the patient, who gave verbal consent to proceed.  History of Present Illness     Past Medical History:  Diagnosis Date   Atrial fibrillation (HCC)    BPH (benign prostatic hypertrophy)    Bradycardia 12/30/2014   HR routinely in mid 40s-50s   Coronary artery disease (CAD) excluded 03/2015   False-positive nuclear stress test suggesting inferior ischemia   Dysrhythmia    Elevated PSA    Encounter for Medicare annual wellness exam 09/22/2013   Sees Dr Rolan Molt of Derm Sees Dr Lupita Commander of Gastroenterology Sees Dr Oneil Rafter of [facility] Sees Dr Randon Bile of Optometry   GERD (gastroesophageal reflux disease)    controlled w/ diet and behavioral changes, history of   Grade I diastolic dysfunction 2016   History of kidney stones    Hypertriglyceridemia 03/16/2015   Kidney stones    Lymphadenitis    Melanoma of back (HCC)    Excised Dr Rolan Molt   Mild ascending aorta dilatation    New onset a-fib (HCC) 03/10/2015   OA (osteoarthritis)    Otitis, externa, infective 08/10/2015   Palpitations    Pneumonia 2004   Post herpetic neuralgia    Shingles 07/09/2013   Sleep apnea 07/30/2011   cpap- 13    Snoring disorder 07/30/2011    Past Surgical History:  Procedure Laterality Date   CATARACT EXTRACTION Right    COLONOSCOPY  2018   CYSTOSCOPY/URETEROSCOPY/HOLMIUM LASER/STENT PLACEMENT Left 10/05/2019   Procedure: CYSTOSCOPY/RETROGRADE/URETEROSCOPY/HOLMIUM LASER/STENT PLACEMENT;  Surgeon: Ottelin, Mark, MD;  Location: [facility];  Service: Urology;  Laterality: Left;   EXTRACORPOREAL SHOCK WAVE LITHOTRIPSY Left 01/25/2022   Procedure: EXTRACORPOREAL SHOCK WAVE LITHOTRIPSY (ESWL);  Surgeon: Renda Glance,  MD;  Location: [facility];  Service: Urology;  Laterality: Left;   EYE SURGERY Left 12/06/2017   cataract by Dr Ruth FONTANA HERNIA REPAIR Bilateral 06/01/2019   Procedure: LAPAROSCOPIC LEFT AND  RIGHT INGUINAL HERNIA REPAIR WITH MESH;  Surgeon: Sheldon Standing, MD;  Location: [facility];  Service: General;  Laterality: Bilateral;   LEFT HEART CATHETERIZATION WITH CORONARY ANGIOGRAM N/A 04/10/2015   Procedure: LEFT HEART CATHETERIZATION WITH  CORONARY ANGIOGRAM;  Surgeon: Alm LELON Clay, MD;  Location: [facility] C[facility];  Service: Cardiovascular;  Angiographicallly NORMAL CORONARY ARTERIES   MASS EXCISION N/A 05/16/2014   Procedure: EXCISION POSTERIOR NECK MASS, RIGHT CHEST WALL MASS AND RIGHT AXILLARY MASS AXILLARY NODE DISSECTION;  Surgeon: Standing KYM Sheldon, MD;  Location: [facility];  Service: General;  Laterality: N/A;   melanoma removal     MOUTH SURGERY N/A    NM MYOVIEW  LTD  03/19/2015   FALSE POSITIVE:  INTERMEDIATE RISK. Small sized, moderate intensity inferior ischemic perfusion defect   REVERSE SHOULDER ARTHROPLASTY Left 10/16/2020   Procedure: REVERSE SHOULDER ARTHROPLASTY;  Surgeon: Melita Drivers, MD;  Location: [facility];  Service: Orthopedics;  Laterality: Left;    TRANSTHORACIC ECHOCARDIOGRAM  03/20/2015   Normal LV size and function. EF 60-65%. G1 DD. Trivial AI. Borderline aortic root dilation (41 mm), Mild LA dilation.   VEIN LIGATION AND STRIPPING Left 07/13/2021   Procedure: LIGATION AND STRIPPING OF LEFT GREAT SAPHENOUS VEIN WITH STAB PHLEBECTOMY GREATER THAN 20 INCISIONS TO  LEFT LEG;  Surgeon: Eliza Lonni RAMAN, MD;  Location: [facility] OR;  Service: Vascular;  Laterality: Left;    Family History  Problem Relation Age of Onset   Cancer Mother 42       MM, leukemia   Obesity Mother    Emphysema Father 29   Obesity Father    Alcohol  abuse Father    Cancer Sister 67       brain   Diabetes Sister 58   Obesity Sister     COPD Brother 24   Down syndrome Brother 38       aspirated   Diabetes Son    Pancreatitis Son    Heart disease Paternal Grandmother    COPD Paternal Grandfather    Alcohol  abuse Other    Colon cancer Neg Hx     Social History   Socioeconomic History   Marital status: Married    Spouse name: [name]   Number of children: 2   Years of education: Not on file   Highest education level: Associate degree: occupational, scientist, product/process development, or vocational program  Occupational History   Occupation: retired    Comment: truck driver  Tobacco Use   Smoking status: Former    Types: Cigars    Quit date: 02/11/1989    Years since quitting: 35.9   Smokeless tobacco: Never   Tobacco comments:    Patient smokes one cigar a day. Updated 11/27/2024.  Vaping Use   Vaping status: Never Used  Substance and Sexual Activity   Alcohol  use: Yes    Alcohol /week: 7.0 standard drinks of alcohol     Types: 7 Glasses of wine per week    Comment: 1 per day -- brandy or wine   Drug use: No   Sexual activity: Yes    Comment: lives with wife, retired from [employer] driving, no dietary restrictions  Other Topics Concern   Not on file  Social History Narrative   He is a married father of 2, and grandfather of 4. He has been married to his wife [name] for 51 years. He previously lived in [place]  but has moved with his wife to [place], [place] to be close to his daughter [name] and her family. [name] is our teacher, music.   He previously worked as a naval architect and had 2 years of college after high school. He quit smoking in 1990. He has 6-7 glasses of brandy or wine a week.    He exercises regularly with low impact aerobics 2 days a week and daily walks of 30-45 minutes. His exercise sessions or 60 minutes. He will do some type of exercise at least 7 days a week and is very active doing yard work and wood work.      Patient is right-handed. He lives with his wife in a one level home with a  basement. He drinks 4-5 cups of decaf coffee a day. He and his wife go to the gym 1-2 x a week.   Social Drivers of Health   Tobacco Use: Medium Risk (01/03/2025)   Patient History    Smoking Tobacco Use: Former    Smokeless Tobacco Use: Never    Passive Exposure: Not on file  Financial Resource Strain: Low Risk (01/12/2025)   Overall Financial Resource Strain (CARDIA)    Difficulty of Paying Living Expenses: Not hard at all  Food Insecurity: No Food Insecurity (01/12/2025)   Epic    Worried About Radiation Protection Practitioner of Food in the Last Year: Never true  Ran Out of Food in the Last Year: Never true  Transportation Needs: No Transportation Needs (01/12/2025)   Epic    Lack of Transportation (Medical): No    Lack of Transportation (Non-Medical): No  Physical Activity: Sufficiently Active (01/12/2025)   Exercise Vital Sign    Days of Exercise per Week: 5 days    Minutes of Exercise per Session: 90 min  Stress: No Stress Concern Present (01/12/2025)   [employer] of Occupational Health - Occupational Stress Questionnaire    Feeling of Stress: Not at all  Social Connections: Moderately Isolated (01/12/2025)   Social Connection and Isolation Panel    Frequency of Communication with Friends and Family: More than three times a week    Frequency of Social Gatherings with Friends and Family: Three times a week    Attends Religious Services: Never    Active Member of Clubs or Organizations: No    Attends Banker Meetings: Not on file    Marital Status: Married  Catering Manager Violence: Not on file  Depression (PHQ2-9): Low Risk (03/21/2024)   Depression (PHQ2-9)    PHQ-2 Score: 0  Alcohol  Screen: Low Risk (01/12/2025)   Alcohol  Screen    Last Alcohol  Screening Score (AUDIT): 4  Housing: Low Risk (01/12/2025)   Epic    Unable to Pay for Housing in the Last Year: No    Number of Times Moved in the Last Year: 0    Homeless in the Last Year: No  Utilities:  Not on file  Health Literacy: Not on file    Outpatient Medications Prior to Visit  Medication Sig Dispense Refill   Cholecalciferol (VITAMIN D3) 125 MCG (5000 UT) CAPS Take 1 capsule (5,000 Units total) by mouth daily. 30 capsule 0   ELIQUIS  5 MG TABS tablet Take 1 tablet by mouth twice daily 180 tablet 1   gabapentin  (NEURONTIN ) 300 MG capsule TAKE 3 CAPSULES BY MOUTH THREE TIMES DAILY 270 capsule 0   Multiple Vitamins-Minerals (MULTIVITAMIN MEN 50+) TABS Take by mouth daily.     sildenafil (VIAGRA) 100 MG tablet Take 100 mg by mouth daily as needed for erectile dysfunction.     tamsulosin  (FLOMAX ) 0.4 MG CAPS capsule Take 1 capsule (0.4 mg total) by mouth at bedtime. 14 capsule 0   No facility-administered medications prior to visit.    Allergies[1]  Review of Systems  Constitutional:  Negative for fever and malaise/fatigue.  HENT:  Negative for congestion.   Eyes:  Negative for blurred vision.  Respiratory:  Negative for shortness of breath.   Cardiovascular:  Negative for chest pain, palpitations and leg swelling.  Gastrointestinal:  Negative for abdominal pain, blood in stool and nausea.  Genitourinary:  Negative for dysuria and frequency.  Musculoskeletal:  Negative for falls.  Skin:  Negative for rash.  Neurological:  Negative for dizziness, loss of consciousness and headaches.  Endo/Heme/Allergies:  Negative for environmental allergies.  Psychiatric/Behavioral:  Negative for depression. The patient is not nervous/anxious.        Objective:    Physical Exam Vitals reviewed.  Constitutional:      Appearance: Normal appearance. He is not ill-appearing.  HENT:     Head: Normocephalic and atraumatic.     Nose: Nose normal.  Eyes:     Conjunctiva/sclera: Conjunctivae normal.  Cardiovascular:     Rate and Rhythm: Normal rate.     Pulses: Normal pulses.     Heart sounds: Normal heart sounds. No murmur heard. Pulmonary:  Effort: Pulmonary effort is normal.      Breath sounds: Normal breath sounds. No wheezing.  Abdominal:     Palpations: Abdomen is soft. There is no mass.     Tenderness: There is no abdominal tenderness.  Musculoskeletal:     Cervical back: Normal range of motion.     Right lower leg: No edema.     Left lower leg: No edema.  Skin:    General: Skin is warm and dry.  Neurological:     General: No focal deficit present.     Mental Status: He is alert and oriented to person, place, and time.  Psychiatric:        Mood and Affect: Mood normal.    There were no vitals taken for this visit. Wt Readings from Last 3 Encounters:  01/03/25 217 lb (98.4 kg)  11/27/24 229 lb (103.9 kg)  10/04/24 217 lb (98.4 kg)    Diabetic Foot Exam - Simple   No data filed    Lab Results  Component Value Date   WBC 5.6 07/12/2024   HGB 14.9 07/12/2024   HCT 44.7 07/12/2024   PLT 174.0 07/12/2024   GLUCOSE 89 07/12/2024   CHOL 148 07/12/2024   TRIG 95 07/12/2024   HDL 52 07/12/2024   LDLCALC 78 07/12/2024   ALT 16 07/12/2024   AST 17 07/12/2024   NA 140 07/12/2024   K 4.5 07/12/2024   CL 107 07/12/2024   CREATININE 0.95 07/12/2024   BUN 32 (H) 07/12/2024   CO2 27 07/12/2024   TSH 1.58 07/12/2024   PSA 4.94 (H) 01/15/2010   INR 1.22 04/04/2015   HGBA1C 6.0 07/12/2024    Lab Results  Component Value Date   TSH 1.58 07/12/2024   Lab Results  Component Value Date   WBC 5.6 07/12/2024   HGB 14.9 07/12/2024   HCT 44.7 07/12/2024   MCV 97.3 07/12/2024   PLT 174.0 07/12/2024   Lab Results  Component Value Date   NA 140 07/12/2024   K 4.5 07/12/2024   CO2 27 07/12/2024   GLUCOSE 89 07/12/2024   BUN 32 (H) 07/12/2024   CREATININE 0.95 07/12/2024   BILITOT 0.7 07/12/2024   ALKPHOS 62 12/15/2023   AST 17 07/12/2024   ALT 16 07/12/2024   PROT 6.3 07/12/2024   ALBUMIN 4.0 12/15/2023   CALCIUM 9.2 07/12/2024   ANIONGAP 11 12/29/2021   EGFR 74 12/15/2023   GFR 80.84 10/11/2019   Lab Results  Component Value Date    CHOL 148 07/12/2024   Lab Results  Component Value Date   HDL 52 07/12/2024   Lab Results  Component Value Date   LDLCALC 78 07/12/2024   Lab Results  Component Value Date   TRIG 95 07/12/2024   Lab Results  Component Value Date   CHOLHDL 2.8 07/12/2024   Lab Results  Component Value Date   HGBA1C 6.0 07/12/2024       Assessment & Plan:  Vitamin D  deficiency Assessment & Plan: Supplement and monitor   Prediabetes Assessment & Plan: hgba1c acceptable, minimize simple carbs. Increase exercise as tolerated.    Muscle cramps Assessment & Plan: Hydrate and monitor   Hyperglycemia Assessment & Plan: hgba1c acceptable, minimize simple carbs. Increase exercise as tolerated.    BMI 31.0-31.9,adult Assessment & Plan: Encouraged DASH or MIND diet, decrease po intake and increase exercise as tolerated. Needs 7-8 hours of sleep nightly. Avoid trans fats, eat small, frequent meals every 4-5 hours with  lean proteins, complex carbs and healthy fats. Minimize simple carbs, high fat foods and processed foods      Assessment and Plan Assessment & Plan      Harlene Horton, MD     [1] No Known Allergies "

## 2025-01-12 NOTE — Assessment & Plan Note (Signed)
 hgba1c acceptable, minimize simple carbs. Increase exercise as tolerated.

## 2025-01-12 NOTE — Assessment & Plan Note (Signed)
 Hydrate and monitor

## 2025-01-12 NOTE — Assessment & Plan Note (Signed)
 Her BMI today is 46. Encouraged ongoing weight-loss efforts, as even modest reductions can significantly improve overall health. Encouraged DASH or MIND diet, decrease po intake and increase exercise as tolerated. Needs 7-8 hours of sleep nightly. Avoid trans fats, eat small, frequent meals every 4-5 hours with lean proteins, complex carbs and healthy fats. Minimize simple carbs, high fat foods and processed foods

## 2025-01-12 NOTE — Assessment & Plan Note (Signed)
 Supplement and monitor

## 2025-01-14 ENCOUNTER — Ambulatory Visit: Payer: Self-pay | Admitting: Family Medicine

## 2025-01-14 ENCOUNTER — Ambulatory Visit: Admitting: Family Medicine

## 2025-01-14 ENCOUNTER — Encounter: Payer: Self-pay | Admitting: Family Medicine

## 2025-01-14 VITALS — BP 120/80 | HR 68 | Temp 97.8°F | Resp 16 | Ht 68.5 in | Wt 225.4 lb

## 2025-01-14 DIAGNOSIS — I4821 Permanent atrial fibrillation: Secondary | ICD-10-CM | POA: Diagnosis not present

## 2025-01-14 DIAGNOSIS — G8929 Other chronic pain: Secondary | ICD-10-CM | POA: Diagnosis not present

## 2025-01-14 DIAGNOSIS — E781 Pure hyperglyceridemia: Secondary | ICD-10-CM

## 2025-01-14 DIAGNOSIS — E559 Vitamin D deficiency, unspecified: Secondary | ICD-10-CM

## 2025-01-14 DIAGNOSIS — Z6831 Body mass index (BMI) 31.0-31.9, adult: Secondary | ICD-10-CM | POA: Diagnosis not present

## 2025-01-14 DIAGNOSIS — Z23 Encounter for immunization: Secondary | ICD-10-CM

## 2025-01-14 DIAGNOSIS — M25562 Pain in left knee: Secondary | ICD-10-CM | POA: Insufficient documentation

## 2025-01-14 DIAGNOSIS — R7303 Prediabetes: Secondary | ICD-10-CM | POA: Diagnosis not present

## 2025-01-14 DIAGNOSIS — R252 Cramp and spasm: Secondary | ICD-10-CM | POA: Diagnosis not present

## 2025-01-14 DIAGNOSIS — R739 Hyperglycemia, unspecified: Secondary | ICD-10-CM

## 2025-01-14 DIAGNOSIS — E66811 Obesity, class 1: Secondary | ICD-10-CM

## 2025-01-14 LAB — COMPREHENSIVE METABOLIC PANEL WITH GFR
ALT: 16 U/L (ref 3–53)
AST: 18 U/L (ref 5–37)
Albumin: 4.2 g/dL (ref 3.5–5.2)
Alkaline Phosphatase: 52 U/L (ref 39–117)
BUN: 22 mg/dL (ref 6–23)
CO2: 29 meq/L (ref 19–32)
Calcium: 9.4 mg/dL (ref 8.4–10.5)
Chloride: 106 meq/L (ref 96–112)
Creatinine, Ser: 1.03 mg/dL (ref 0.40–1.50)
GFR: 67.88 mL/min
Glucose, Bld: 89 mg/dL (ref 70–99)
Potassium: 4.4 meq/L (ref 3.5–5.1)
Sodium: 141 meq/L (ref 135–145)
Total Bilirubin: 0.7 mg/dL (ref 0.2–1.2)
Total Protein: 6.7 g/dL (ref 6.0–8.3)

## 2025-01-14 LAB — LIPID PANEL
Cholesterol: 171 mg/dL (ref 28–200)
HDL: 55.9 mg/dL
LDL Cholesterol: 98 mg/dL (ref 10–99)
NonHDL: 114.92
Total CHOL/HDL Ratio: 3
Triglycerides: 85 mg/dL (ref 10.0–149.0)
VLDL: 17 mg/dL (ref 0.0–40.0)

## 2025-01-14 LAB — CBC WITH DIFFERENTIAL/PLATELET
Basophils Absolute: 0 K/uL (ref 0.0–0.1)
Basophils Relative: 0.6 % (ref 0.0–3.0)
Eosinophils Absolute: 0.1 K/uL (ref 0.0–0.7)
Eosinophils Relative: 1.6 % (ref 0.0–5.0)
HCT: 45.2 % (ref 39.0–52.0)
Hemoglobin: 15.2 g/dL (ref 13.0–17.0)
Lymphocytes Relative: 20.2 % (ref 12.0–46.0)
Lymphs Abs: 1.2 K/uL (ref 0.7–4.0)
MCHC: 33.6 g/dL (ref 30.0–36.0)
MCV: 97.2 fl (ref 78.0–100.0)
Monocytes Absolute: 0.6 K/uL (ref 0.1–1.0)
Monocytes Relative: 9.4 % (ref 3.0–12.0)
Neutro Abs: 4 K/uL (ref 1.4–7.7)
Neutrophils Relative %: 68.2 % (ref 43.0–77.0)
Platelets: 184 K/uL (ref 150.0–400.0)
RBC: 4.65 Mil/uL (ref 4.22–5.81)
RDW: 14.6 % (ref 11.5–15.5)
WBC: 5.8 K/uL (ref 4.0–10.5)

## 2025-01-14 LAB — TSH: TSH: 2.2 u[IU]/mL (ref 0.35–5.50)

## 2025-01-14 LAB — VITAMIN D 25 HYDROXY (VIT D DEFICIENCY, FRACTURES): VITD: 51.57 ng/mL (ref 30.00–100.00)

## 2025-01-14 LAB — HEMOGLOBIN A1C: Hgb A1c MFr Bld: 5.6 % (ref 4.6–6.5)

## 2025-01-14 NOTE — Assessment & Plan Note (Signed)
 Working with BIG LOTS

## 2025-01-14 NOTE — Patient Instructions (Signed)
 Exercises for Chronic Knee Pain  Chronic knee pain is pain that lasts longer than 3 months. For most people with chronic knee pain, exercise and weight loss is an important part of treatment. Your health care provider may want you to focus on:  Making the muscles that support your knee stronger. This can take pressure off your knee and reduce pain.  Preventing knee stiffness.  How far you can move your knee, keeping it there or making it farther.  Losing weight (if this applies) to take pressure off your knee, lower your risk for injury, and make it easier for you to exercise.  Your provider will help you make an exercise program that fits your needs and physical abilities. Below are simple, low-impact exercises you can do at home. Ask your provider or physical therapist how often you should do your exercise program and how many times to repeat each exercise.  General safety tips    Get your provider's approval before doing any exercises.  Start slowly and stop any time you feel pain.  Do not exercise if your knee pain is flaring up.  Warm up first. Stretching a cold muscle can cause an injury. Do 5-10 minutes of easy movement or light stretching before beginning your exercises.  Do 5-10 minutes of low-impact activity (like walking or cycling) before starting strengthening exercises.  Contact your provider any time you have pain during or after exercising. Exercise can cause discomfort but should not be painful. It is normal to be a little stiff or sore after exercising.  Stretching and range-of-motion exercises  Front thigh stretch    Stand up straight and support your body by holding on to a chair or resting one hand on a M[place]rea.  With your legs straight and close together, bend one knee to lift your heel up toward your butt.  Using one hand for support, grab your ankle with your free hand.  Pull your foot up closer toward your butt to feel the stretch in front of your thigh.  Hold the stretch for 30  seconds.  Repeat __________ times. Complete this exercise __________ times a day.  Back thigh stretch    Sit on the floor with your back straight and your legs out straight in front of you.  Place the palms of your hands on the floor and slide them toward your feet as you bend at the hip.  Try to touch your nose to your knees and feel the stretch in the back of your thighs.  Hold for 30 seconds.  Repeat __________ times. Complete this exercise __________ times a day.  Calf stretch    Stand facing a Sheehy.  Place the palms of your hands flat against the Ghazi, arms extended, and lean slightly against the Muha.  Get into a lunge position with one leg bent at the knee and the other leg stretched out straight behind you.  Keep both feet facing the Ziesmer and increase the bend in your knee while keeping the heel of the other leg flat on the ground.  You should feel the stretch in your calf. Hold for 30 seconds.  Repeat __________ times. Complete this exercise __________ times a day.  Strengthening exercises  Straight leg lift    Lie on your back with one knee bent and the other leg out straight.  Slowly lift the straight leg without bending the knee.  Lift until your foot is about 12 inches (30 cm) off the floor.  Hold for  3-5 seconds and slowly lower your leg.  Repeat __________ times. Complete this exercise __________ times a day.  Single leg dip    Stand between two chairs and put both hands on the backs of the chairs for support.  Extend one leg out straight with your body weight resting on the heel of the standing leg.  Slowly bend your standing knee to dip your body to the level that is comfortable for you.  Hold for 3-5 seconds.  Repeat __________ times. Complete this exercise __________ times a day.  Hamstring curls    Stand straight, knees close together, facing the back of a chair.  Hold on to the back of a chair with both hands.  Keep one leg straight. Bend the other knee while bringing the heel up toward the butt  until the knee is bent at a 90-degree angle (right angle).  Hold for 3-5 seconds.  Repeat __________ times. Complete this exercise __________ times a day.  Neece squat    Stand straight with your back, hips, and head against a Rushing.  Step forward one foot at a time with your back still against the Spargur.  Your feet should be 2 feet (61 cm) from the Jeanpaul at shoulder width. Keeping your back, hips, and head against the Colligan, slide down the Brenner to as close to a sitting position as you can get.  Hold for 5-10 seconds, then slowly slide back up.  Repeat __________ times. Complete this exercise __________ times a day.  Step-ups    Stand in front of a sturdy platform or stool that is about 6 inches (15 cm) high.  Slowly step up with your left / right foot, keeping your knee in line with your hip and foot. Do not let your knee bend so far that you cannot see your toes. Hold on to a chair for balance, but do not use it for support.  Slowly unlock your knee and lower yourself to the starting position.  Repeat __________ times. Complete this exercise __________ times a day.  Contact a health care provider if:  Your exercises cause pain.  Your pain is worse after you exercise.  Your pain prevents you from doing your exercises.  This information is not intended to replace advice given to you by your health care provider. Make sure you discuss any questions you have with your health care provider.  Document Revised: 12/28/2022 Document Reviewed: 12/28/2022  [employer]vier Patient Education  2024 [employer].

## 2025-01-14 NOTE — Assessment & Plan Note (Signed)
 Rate controlled Is on Eliquis

## 2025-01-14 NOTE — Assessment & Plan Note (Signed)
 Medial pain with walking at times and with certain movements, sharp

## 2025-01-15 ENCOUNTER — Encounter: Payer: Self-pay | Admitting: Family Medicine

## 2025-01-15 DIAGNOSIS — H269 Unspecified cataract: Secondary | ICD-10-CM

## 2025-01-15 DIAGNOSIS — D489 Neoplasm of uncertain behavior, unspecified: Secondary | ICD-10-CM

## 2025-01-15 DIAGNOSIS — N281 Cyst of kidney, acquired: Secondary | ICD-10-CM

## 2025-01-15 LAB — INSULIN, RANDOM: Insulin: 12.3 u[IU]/mL

## 2025-01-16 ENCOUNTER — Telehealth: Payer: Self-pay | Admitting: Family Medicine

## 2025-01-16 NOTE — Telephone Encounter (Signed)
 Copied from CRM #[id number]. Topic: Appointments - Scheduling Inquiry for Clinic >> Jan 15, 2025  5:00 PM Kevelyn M wrote: Patient's wife calling because he wants to establish care with Dr. Cruz. His wife just established care and would like his appointment to be the same day and time Sept 15th at 9:40am.  Call back # [phone]  Would it be ok to do these back to back on the same day?

## 2025-04-04 ENCOUNTER — Ambulatory Visit (INDEPENDENT_AMBULATORY_CARE_PROVIDER_SITE_OTHER): Admitting: Family Medicine

## 2025-07-18 ENCOUNTER — Ambulatory Visit: Admitting: Family Medicine
